# Patient Record
Sex: Female | Born: 1955 | Race: White | Hispanic: No | Marital: Married | Smoking: Former smoker
Health system: Southern US, Community
[De-identification: ages and names within clinical notes are randomized; demographics above are authoritative.]

## PROBLEM LIST (undated history)

## (undated) DIAGNOSIS — E119 Type 2 diabetes mellitus without complications: Secondary | ICD-10-CM

## (undated) DIAGNOSIS — T8859XA Other complications of anesthesia, initial encounter: Secondary | ICD-10-CM

## (undated) DIAGNOSIS — N200 Calculus of kidney: Secondary | ICD-10-CM

## (undated) DIAGNOSIS — F32A Depression, unspecified: Secondary | ICD-10-CM

## (undated) DIAGNOSIS — K589 Irritable bowel syndrome without diarrhea: Secondary | ICD-10-CM

## (undated) DIAGNOSIS — I10 Essential (primary) hypertension: Secondary | ICD-10-CM

## (undated) DIAGNOSIS — T4145XA Adverse effect of unspecified anesthetic, initial encounter: Secondary | ICD-10-CM

## (undated) DIAGNOSIS — K5090 Crohn's disease, unspecified, without complications: Secondary | ICD-10-CM

## (undated) DIAGNOSIS — J45909 Unspecified asthma, uncomplicated: Secondary | ICD-10-CM

## (undated) DIAGNOSIS — E559 Vitamin D deficiency, unspecified: Secondary | ICD-10-CM

## (undated) DIAGNOSIS — S92909A Unspecified fracture of unspecified foot, initial encounter for closed fracture: Secondary | ICD-10-CM

## (undated) DIAGNOSIS — M1990 Unspecified osteoarthritis, unspecified site: Secondary | ICD-10-CM

## (undated) DIAGNOSIS — E7800 Pure hypercholesterolemia, unspecified: Secondary | ICD-10-CM

## (undated) DIAGNOSIS — F329 Major depressive disorder, single episode, unspecified: Secondary | ICD-10-CM

## (undated) DIAGNOSIS — J449 Chronic obstructive pulmonary disease, unspecified: Secondary | ICD-10-CM

## (undated) HISTORY — DX: Type 2 diabetes mellitus without complications: E11.9

## (undated) HISTORY — DX: Irritable bowel syndrome, unspecified: K58.9

## (undated) HISTORY — DX: Chronic obstructive pulmonary disease, unspecified: J44.9

## (undated) HISTORY — DX: Calculus of kidney: N20.0

## (undated) HISTORY — DX: Depression, unspecified: F32.A

## (undated) HISTORY — PX: SHOULDER SURGERY: SHX246

## (undated) HISTORY — DX: Major depressive disorder, single episode, unspecified: F32.9

## (undated) HISTORY — DX: Pure hypercholesterolemia, unspecified: E78.00

## (undated) HISTORY — PX: LITHOTRIPSY: SUR834

## (undated) HISTORY — DX: Unspecified fracture of unspecified foot, initial encounter for closed fracture: S92.909A

## (undated) HISTORY — DX: Crohn's disease, unspecified, without complications: K50.90

## (undated) HISTORY — PX: POLYPECTOMY: SHX149

## (undated) HISTORY — PX: OOPHORECTOMY: SHX86

## (undated) HISTORY — DX: Essential (primary) hypertension: I10

---
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
## 1983-04-10 HISTORY — PX: APPENDECTOMY: SHX54

## 1983-04-10 HISTORY — PX: LAPAROSCOPIC SUPRACERVICAL HYSTERECTOMY: SUR797

## 1983-04-10 HISTORY — PX: ABDOMINAL HYSTERECTOMY: SHX81

## 2003-03-03 ENCOUNTER — Other Ambulatory Visit: Payer: Self-pay

## 2003-03-04 ENCOUNTER — Other Ambulatory Visit: Payer: Self-pay

## 2003-04-10 HISTORY — PX: LAPAROSCOPIC CHOLECYSTECTOMY: SUR755

## 2003-04-22 ENCOUNTER — Other Ambulatory Visit: Payer: Self-pay

## 2003-05-15 ENCOUNTER — Other Ambulatory Visit: Payer: Self-pay

## 2003-06-02 ENCOUNTER — Other Ambulatory Visit: Payer: Self-pay

## 2003-12-07 ENCOUNTER — Other Ambulatory Visit: Payer: Self-pay

## 2003-12-08 ENCOUNTER — Other Ambulatory Visit: Payer: Self-pay

## 2004-04-11 ENCOUNTER — Ambulatory Visit: Payer: Self-pay

## 2005-02-02 ENCOUNTER — Ambulatory Visit: Payer: Self-pay | Admitting: Unknown Physician Specialty

## 2005-03-24 ENCOUNTER — Emergency Department: Payer: Self-pay | Admitting: Emergency Medicine

## 2005-04-27 ENCOUNTER — Ambulatory Visit: Payer: Self-pay | Admitting: Internal Medicine

## 2005-07-17 ENCOUNTER — Ambulatory Visit: Payer: Self-pay

## 2005-12-27 ENCOUNTER — Ambulatory Visit: Payer: Self-pay

## 2006-05-25 ENCOUNTER — Emergency Department: Payer: Self-pay

## 2006-06-05 ENCOUNTER — Inpatient Hospital Stay (HOSPITAL_COMMUNITY): Admission: AD | Admit: 2006-06-05 | Discharge: 2006-06-07 | Payer: Self-pay | Admitting: Orthopedic Surgery

## 2007-05-01 ENCOUNTER — Ambulatory Visit: Payer: Self-pay | Admitting: Internal Medicine

## 2007-06-27 ENCOUNTER — Ambulatory Visit: Payer: Self-pay | Admitting: Unknown Physician Specialty

## 2007-12-27 ENCOUNTER — Ambulatory Visit: Payer: Self-pay | Admitting: Internal Medicine

## 2008-03-27 ENCOUNTER — Emergency Department: Payer: Self-pay | Admitting: Emergency Medicine

## 2008-05-26 ENCOUNTER — Ambulatory Visit: Payer: Self-pay | Admitting: Internal Medicine

## 2009-05-14 ENCOUNTER — Ambulatory Visit: Payer: Self-pay | Admitting: Rheumatology

## 2009-11-03 ENCOUNTER — Ambulatory Visit: Payer: Self-pay | Admitting: Family Medicine

## 2010-06-20 ENCOUNTER — Emergency Department: Payer: Self-pay | Admitting: Emergency Medicine

## 2010-07-31 ENCOUNTER — Ambulatory Visit: Payer: Self-pay | Admitting: Internal Medicine

## 2011-04-10 HISTORY — PX: BREAST EXCISIONAL BIOPSY: SUR124

## 2012-01-18 ENCOUNTER — Telehealth: Payer: Self-pay | Admitting: Internal Medicine

## 2012-01-18 NOTE — Telephone Encounter (Signed)
Patient spouse advised as instructed via telephone.

## 2012-01-18 NOTE — Telephone Encounter (Signed)
If abdominal pain and diarrhea, i prefer her to go ahead and be evaluated now - to make sure nothing more acute is going on (especially if the symptoms are persistant) - then can follow up here afterwards.  May need scan, etc.  Thanks.

## 2012-01-18 NOTE — Telephone Encounter (Signed)
Pt is having really bad abdominal cramps and a lot of diarrhea. She was wanting to know if she could come in any sooner to see you.   Best call back number  Work [phone] or  Home [phone]

## 2012-01-31 ENCOUNTER — Telehealth: Payer: Self-pay | Admitting: Internal Medicine

## 2012-01-31 DIAGNOSIS — R109 Unspecified abdominal pain: Secondary | ICD-10-CM

## 2012-01-31 NOTE — Telephone Encounter (Signed)
Caller: Kambra/Patient; Patient Name: Amanda Wiley; PCP: Einar Pheasant; Best Callback Phone Number: [phone] Onset- 1 month per pt. Pt c/o of  stomach cramps and diarrhea. She has diarrhea at least  5 x per day. She rates stomach cramps at a 4 on pain scale. She did see pink on the toilet paper yesterday with wiping, which she thinks is from irritation from so much diarrhea.   Pt has hx of IBS, Crohn's and Diverticulitis. She is a patient of Dr. Nicki Reaper but has not seen her at this location yet. Her appointment is scheduled for 03/04/12. Pt reports last week she saw the PA at her job [employer] for the symptoms and he started her on Flagyl and she is on day 7  of a 10day prescription. Emergent s/s of Diarrhea or change in bowel habit protocol r/o. Pt to see provider within 24 hrs. Pt wants to know if Dr. Nicki Reaper can see her sooner than 03/04/12 appointment. No appointments seen today or tomorrow. Pt also states she sees Dr. Vira Agar but has to have a referral, so if Dr. Nicki Reaper can not see her can she send a referral so she can see Dr. Vira Agar for the issues. Message sent. Please call.

## 2012-01-31 NOTE — Telephone Encounter (Signed)
If she is doing better on the Flagyl, but having persistent intermittent flares - I do think she needs to see GI (Dr Dillard Essex office).  I can make appt if she is agreeable to do this, but I need to confirm she is doing better.

## 2012-02-01 NOTE — Telephone Encounter (Signed)
Called patient to inform her. Patient stated that she has been in contact with Dr.Elliots office. She said that she needs a referral immediately. She would like for [place] to call her back to let her know.

## 2012-02-04 NOTE — Telephone Encounter (Signed)
Pt called and needs referral to GI.  Need appt as soon as possible.  Thanks.

## 2012-03-04 ENCOUNTER — Ambulatory Visit (INDEPENDENT_AMBULATORY_CARE_PROVIDER_SITE_OTHER): Payer: PRIVATE HEALTH INSURANCE | Admitting: Internal Medicine

## 2012-03-04 ENCOUNTER — Encounter: Payer: Self-pay | Admitting: Internal Medicine

## 2012-03-04 VITALS — BP 163/80 | HR 86 | Temp 97.6°F | Ht 65.0 in | Wt 263.0 lb

## 2012-03-04 DIAGNOSIS — K219 Gastro-esophageal reflux disease without esophagitis: Secondary | ICD-10-CM | POA: Insufficient documentation

## 2012-03-04 DIAGNOSIS — K5090 Crohn's disease, unspecified, without complications: Secondary | ICD-10-CM | POA: Insufficient documentation

## 2012-03-04 DIAGNOSIS — I10 Essential (primary) hypertension: Secondary | ICD-10-CM | POA: Insufficient documentation

## 2012-03-04 DIAGNOSIS — R5381 Other malaise: Secondary | ICD-10-CM

## 2012-03-04 DIAGNOSIS — E119 Type 2 diabetes mellitus without complications: Secondary | ICD-10-CM

## 2012-03-04 DIAGNOSIS — E7800 Pure hypercholesterolemia, unspecified: Secondary | ICD-10-CM

## 2012-03-04 DIAGNOSIS — J45909 Unspecified asthma, uncomplicated: Secondary | ICD-10-CM

## 2012-03-04 DIAGNOSIS — R5383 Other fatigue: Secondary | ICD-10-CM

## 2012-03-04 DIAGNOSIS — Z139 Encounter for screening, unspecified: Secondary | ICD-10-CM

## 2012-03-04 DIAGNOSIS — E559 Vitamin D deficiency, unspecified: Secondary | ICD-10-CM | POA: Insufficient documentation

## 2012-03-04 NOTE — Assessment & Plan Note (Addendum)
Has a documented history of Crohn's disease.  Colonoscopy 06/27/07 revealed a 54m polyp, diverticulosis and internal hemorrhoids.  Pt reports no mention of Crohn's with last evaluation.  Is having the intermittent abdominal pain, nausea, dysphagia and change in appetite as well as loose stool.  Will refer to GI for evaluation.  Had an abdominal CT (per her report) relatively recently.  Obtain results.  Has an appt made for GI next week.  Sx improved today.  Follow.

## 2012-03-04 NOTE — Assessment & Plan Note (Addendum)
EGD 06/27/07 revealed a single gastric polyp with some gastric mucosal erythema.  On nexium.  Nausea and epigastric pain as outlined.  Feels full faster.  Refer to GI for evaluation.

## 2012-03-04 NOTE — Progress Notes (Signed)
  Subjective:    [name], female    DOB: 04-09-1956, 56 y.o.   MRN: [id number]  HPI 56 year old female with past history of documented Crohn's disease, hypertension, hypercholesterolemia, diabetes and IBS who comes in today for a scheduled follow up.  She has had problems with her bowels and increased abdominal pain.  Was having multiple stools per day.  Watery stools.  Increased abdominal pain- more localized in the epigastric region and left side.  Some nausea.  No vomiting.  When she eats - she feels her stomach start to "churn".  Fried foods aggravate.  Some occasional dysphagia and full feeling.  No acid reflux.  Has an appt with GI next week.  Abdominal pain has improved some for now.  Still with loose stool.  States her sugars have been staying in the 170s.  Not watching what she eats.  Recently evaluated for nipple discharge.  Due to follow up with Dr Bary Castilla 12/13.  Has mammo scheduled 03/15/12.    Past Medical History  Diagnosis Date  . Hypertension   . Diabetes mellitus   . Hypercholesterolemia   . Crohn's disease   . IBS (irritable bowel syndrome)   . Depression   . Nephrolithiasis     s/p lithotripsy [facility])  . COPD (chronic obstructive pulmonary disease)     Review of Systems Patient denies any headache, lightheadedness or dizziness.  No significant sinus or allergy symptoms.  No chest pain, tightness or palpitations.  No increased shortness of breath, cough or congestion.  No vomiting.  Intermittent nausea.  No constipation, BRBPR or melana.  Does have the loose stool as outlined.  Abdominal pain as outlined above.  No urine change.        Objective:   Physical Exam Filed Vitals:   03/04/12 0821  BP: 163/80  Pulse: 86  Temp: 97.6 F (36.4 C)   Blood pressure recheck:  76/33  56 year old female in no acute distress.   HEENT:  Nares - clear.  OP- without lesions or erythema.  NECK:  Supple, nontender.  No audible bruit.   HEART:   Appears to be regular. LUNGS:  Without crackles or wheezing audible.  Respirations even and unlabored.   RADIAL PULSE:  Equal bilaterally.  ABDOMEN:  Soft.  Minimal tenderness to palpation over the epigastric region.  No rebound or guardng present.  No audible abdominal bruit.   EXTREMITIES:  No increased edema to be present.                     Assessment & Plan:  INCREASED PSYCHOSOCIAL STRESSORS.  On Wellbutrin.  Stable.    CARDIOVASCULAR.  Asymptomatic.  Continue risk factor modification.    PREVIOUS NIPPLE DISCHARGE.  Evaluated by Dr Bary Castilla.  Due follow up in 12/13.  Mammogram scheduled.   GI.  Abdominal pain and bowel change as outlined.  Has been a persistent intermittent problem for her.  See above.  Continue Nexium.  States she has had a relatively recent CT abdomen.  Obtain results.  Carries the diagnosis of Crohn's disease.  States on her last colonoscopy - no mention of Crohn's.  Has an appt next week with GI.  Keep appt.     HEALTH MAINTENANCE.  Physical 03/26/11.  GI as outlined.  Mammogram 05/07/11 - BiRADS II.

## 2012-03-04 NOTE — Patient Instructions (Addendum)
It was nice seeing you today.  I am sorry you have not been feeling well.  Keep the appt with GI and follow your sugars.  Send me readings as we discussed.  Let me know if problems.

## 2012-03-04 NOTE — Assessment & Plan Note (Addendum)
Low cholesterol diet and exercise.  Declines medication.  Check lipid panel with next fasting labs.

## 2012-03-05 ENCOUNTER — Telehealth: Payer: Self-pay | Admitting: *Deleted

## 2012-03-05 NOTE — Telephone Encounter (Signed)
Jefm Bryant called about patient's notes concerning abdominal pain. Mickel Baas (fax# [phone]) would records to her today. Please advice

## 2012-03-06 ENCOUNTER — Emergency Department: Payer: Self-pay | Admitting: Emergency Medicine

## 2012-03-06 ENCOUNTER — Encounter: Payer: Self-pay | Admitting: Internal Medicine

## 2012-03-06 LAB — RAPID INFLUENZA A&B ANTIGENS

## 2012-03-06 NOTE — Telephone Encounter (Signed)
Note is complete.  You can send to GI.

## 2012-03-06 NOTE — Assessment & Plan Note (Signed)
Sugars as outlined.  Same meds.  Diabetic diet and exercise.  Check met b and a1c.  Keep up to date with eye checks.

## 2012-03-06 NOTE — Assessment & Plan Note (Signed)
Breathing stable.  Has not required prednisone lately.  Follow.

## 2012-03-06 NOTE — Assessment & Plan Note (Signed)
Blood pressure on recheck improved.  Same meds.  Check metabolic panel.

## 2012-03-06 NOTE — Assessment & Plan Note (Signed)
Continue supplements.  Follow vitamin D level.

## 2012-03-07 ENCOUNTER — Telehealth: Payer: Self-pay | Admitting: Internal Medicine

## 2012-03-07 NOTE — Telephone Encounter (Signed)
Called patient to let her know. Patient stated that she will continue using these rx's and that she will call if symptoms persist.

## 2012-03-07 NOTE — Telephone Encounter (Signed)
If she is coughing - i worry about spasm and inflammation in her lungs - given her history.   Using a cough suppressant can worsen this.  I recommend her to use her inhalers regularly (steroid inhaler and her rescue inhaler).  Also continue the prednisone.  Can use mucinex DM in the am and Robitussin DM in the evening - as long as she has no problems taking mucinex or robitussin.  If persistent problems, needs evaluation.

## 2012-03-07 NOTE — Telephone Encounter (Signed)
Pt recently seen you this week and then had to go to the urgent care. She has the Flu and they gave her Prednizone and Tamaflu. She is still coughing badly and she was wondering if you could call her in some Tusinex. Cough syrup with codine. [facility] [place].

## 2012-03-07 NOTE — Telephone Encounter (Signed)
Notes faxed.

## 2012-03-11 ENCOUNTER — Encounter: Payer: Self-pay | Admitting: General Practice

## 2012-03-11 ENCOUNTER — Ambulatory Visit (INDEPENDENT_AMBULATORY_CARE_PROVIDER_SITE_OTHER): Payer: PRIVATE HEALTH INSURANCE | Admitting: Internal Medicine

## 2012-03-11 ENCOUNTER — Encounter: Payer: Self-pay | Admitting: Internal Medicine

## 2012-03-11 ENCOUNTER — Telehealth: Payer: Self-pay | Admitting: Internal Medicine

## 2012-03-11 VITALS — BP 132/80 | HR 82 | Temp 97.9°F | Ht 65.0 in | Wt 257.0 lb

## 2012-03-11 DIAGNOSIS — E119 Type 2 diabetes mellitus without complications: Secondary | ICD-10-CM

## 2012-03-11 DIAGNOSIS — I10 Essential (primary) hypertension: Secondary | ICD-10-CM

## 2012-03-11 DIAGNOSIS — J45909 Unspecified asthma, uncomplicated: Secondary | ICD-10-CM

## 2012-03-11 MED ORDER — ALBUTEROL SULFATE (2.5 MG/3ML) 0.083% IN NEBU: 2.5000 mg | INHALATION_SOLUTION | Freq: Four times a day (QID) | RESPIRATORY_TRACT | Status: DC | PRN

## 2012-03-11 MED ORDER — PREDNISONE 10 MG PO TABS: ORAL_TABLET | ORAL | Status: DC

## 2012-03-11 MED ORDER — LEVOFLOXACIN 500 MG PO TABS: 500.0000 mg | ORAL_TABLET | Freq: Every day | ORAL | Status: DC

## 2012-03-11 NOTE — Patient Instructions (Signed)
It was nice seeing you today.   I am sorry you are not feeling well.  I am going to start levaquin and a prednisone taper.  Keep me posted - how you are doing.

## 2012-03-11 NOTE — Telephone Encounter (Signed)
See if she can come in at 2:30 today.  thanks

## 2012-03-11 NOTE — Telephone Encounter (Signed)
Pt was diagnosed at the hospital with the Flu and she has finished her Tamaflu and still has some stuff in her lungs. She was wondering if she could be seen or anything called in.

## 2012-03-12 ENCOUNTER — Encounter: Payer: Self-pay | Admitting: Internal Medicine

## 2012-03-12 NOTE — Assessment & Plan Note (Signed)
Sugar today 270.  She has active infection and is on prednisone.  Stay hydrated.  Monitor sugars closely.  She has humalog pens if needed for elevated blood sugar.

## 2012-03-12 NOTE — Progress Notes (Signed)
Subjective:    [name], female    DOB: 02-16-1956, 56 y.o.   MRN: [id number]  HPI  56 year old female with past history of documented Crohn's disease, hypertension, hypercholesterolemia, diabetes and IBS who comes in today as a work in with concerns regarding persistent cough and congestion.  She went to the ER several days ago and was diagnosed with the flu.  Was given prednisone and Tamiflu.  She states she is not having the body aches now.  Still with intermittent low grade fever with Tmax 100.4.  No significant sinus pressure, but does have some increased nasal congestion and post nasal drainage.  Sore throat.  "Glands sore".  Increased chest congestion productive of thick green mucus.  Increased cough.  Wheezing.  No significant vomiting.  Had some loose stool yesterday, but this has resolved.      Past Medical History  Diagnosis Date  . Hypertension   . Diabetes mellitus   . Hypercholesterolemia   . Crohn's disease   . IBS (irritable bowel syndrome)   . Depression   . Nephrolithiasis     s/p lithotripsy [facility])  . COPD (chronic obstructive pulmonary disease)     Review of Systems Patient denies any lightheadedness or dizziness.  No chest pain or palpitations.  She does report the increased congestion, cough and wheezing as outlined.  Has taken mucinex.  Finished the prednisone and Tamiflu.  No constipation, BRBPR or melana.  Does have the loose stool as outlined.  Abdominal pain as outlined above.  No urine change.  Increased fatigue.  She eating.  Decreased appetite.  Not checking her sugars.  Some loose stool yesterday.  Denies today.       Objective:   Physical Exam  Filed Vitals:   03/11/12 1442  BP: 132/80  Pulse: 82  Temp: 97.9 F (36.6 C)   Blood pressure recheck:  72/35  56 year old female in no acute distress.   HEENT:  Nares - clear except slightly erythematous turbinates.  OP- without lesions or erythema.  NECK:  Supple.   Minimal tenderness to palpation - anterior cervical region.  No neck rigidity.    HEART:  Appears to be regular. LUNGS:  Without crackles.  Increased congestion that cleared some with coughing.  Increased wheezing - noted more with forced expiration.   Respirations even and unlabored.  Post neg - increased air movement.   RADIAL PULSE:  Equal bilaterally.      Assessment & Plan:  PROBABLE BRONCHITIS/URI.  Post neb exam revealed increased air movement and decreased congestion.  Recently diagnosed with the flu.  Now with thick productive green mucus, persistent fever, etc. Concern regarding a overlying bacterial infection.  Treat with Levaquin 540m q day as directed.  Prednisone taper starting at 646mand decreasing by 64m41mntil off.  Continue Advair bid and the rescue inhaler and nebs as directed.  Can continue mucinex and saline nasal flushes as directed.  Rest.  Fluids.  Out of work the rest of the week.  Explained to her and her husband if symptoms changed of worsened, she was to be reevaluated immediately.  They were comfortable with this plan.     CARDIOVASCULAR.  Asymptomatic.  Continue risk factor modification.    GI.  Abdominal pain and bowel change as outlined previously.  Had to change her appt with GI - because she was sick.  Minimal loose stool yesterday.  None today.  Follow.     HEALTH  MAINTENANCE.  Physical 03/26/11.  GI as outlined.  Mammogram 05/07/11 - BiRADS II.

## 2012-03-12 NOTE — Assessment & Plan Note (Signed)
Treat the active infection as outlined.  Follow closely.  If any acute change or worsening - she is to be reevaluated.  This was explained to her and her husband in detail.  They were comfortable with this plan.

## 2012-03-12 NOTE — Assessment & Plan Note (Signed)
Blood pressure under good control.  Same meds.  Follow.

## 2012-03-18 ENCOUNTER — Telehealth: Payer: Self-pay | Admitting: Internal Medicine

## 2012-03-18 NOTE — Telephone Encounter (Signed)
Needed 3 prescriptions written to take to her pharmacy at [facility] . Wellbutrin 150 mg , Glucophage 500 mg and Singular 42m  . Please call patient to pick up.

## 2012-03-20 ENCOUNTER — Other Ambulatory Visit: Payer: Self-pay | Admitting: *Deleted

## 2012-03-20 MED ORDER — METFORMIN HCL 500 MG PO TABS: ORAL_TABLET | ORAL | Status: DC

## 2012-03-20 MED ORDER — MONTELUKAST SODIUM 10 MG PO TABS: 10.0000 mg | ORAL_TABLET | Freq: Every day | ORAL | Status: DC

## 2012-03-20 MED ORDER — BUPROPION HCL ER (XL) 150 MG PO TB24: 150.0000 mg | ORAL_TABLET | Freq: Every day | ORAL | Status: DC

## 2012-03-20 NOTE — Telephone Encounter (Signed)
Prescriptions faxed to pharmacy.

## 2012-03-20 NOTE — Telephone Encounter (Signed)
Prescription faxed to pharmacy.

## 2012-03-20 NOTE — Telephone Encounter (Signed)
Dr. Nicki Reaper:  The patient needs the following scripts written for to take to [facility].  Wellbutrin 150 mg, Glucophage 500 mg and Singular 10 mg.

## 2012-03-20 NOTE — Telephone Encounter (Signed)
rx printed and placed on your desk.  Pt can come by and pick up

## 2012-03-20 NOTE — Telephone Encounter (Signed)
Error

## 2012-03-21 ENCOUNTER — Other Ambulatory Visit: Payer: Self-pay | Admitting: *Deleted

## 2012-03-21 MED ORDER — MONTELUKAST SODIUM 10 MG PO TABS: 10.0000 mg | ORAL_TABLET | Freq: Every day | ORAL | Status: DC

## 2012-03-21 MED ORDER — METFORMIN HCL 500 MG PO TABS: ORAL_TABLET | ORAL | Status: DC

## 2012-03-21 MED ORDER — BUPROPION HCL ER (XL) 150 MG PO TB24: 150.0000 mg | ORAL_TABLET | Freq: Every day | ORAL | Status: DC

## 2012-03-25 ENCOUNTER — Other Ambulatory Visit (INDEPENDENT_AMBULATORY_CARE_PROVIDER_SITE_OTHER): Payer: PRIVATE HEALTH INSURANCE

## 2012-03-25 DIAGNOSIS — E119 Type 2 diabetes mellitus without complications: Secondary | ICD-10-CM

## 2012-03-25 DIAGNOSIS — J45909 Unspecified asthma, uncomplicated: Secondary | ICD-10-CM

## 2012-03-25 DIAGNOSIS — E7800 Pure hypercholesterolemia, unspecified: Secondary | ICD-10-CM

## 2012-03-25 DIAGNOSIS — R5383 Other fatigue: Secondary | ICD-10-CM

## 2012-03-25 DIAGNOSIS — R5381 Other malaise: Secondary | ICD-10-CM

## 2012-03-25 DIAGNOSIS — E559 Vitamin D deficiency, unspecified: Secondary | ICD-10-CM

## 2012-03-25 DIAGNOSIS — I10 Essential (primary) hypertension: Secondary | ICD-10-CM

## 2012-03-25 LAB — CBC WITH DIFFERENTIAL/PLATELET
Basophils Absolute: 0.1 10*3/uL (ref 0.0–0.1)
Basophils Relative: 0.6 % (ref 0.0–3.0)
Eosinophils Relative: 1.4 % (ref 0.0–5.0)
Lymphocytes Relative: 21.1 % (ref 12.0–46.0)
Lymphs Abs: 1.9 10*3/uL (ref 0.7–4.0)
MCHC: 33.1 g/dL (ref 30.0–36.0)
Monocytes Absolute: 1 10*3/uL (ref 0.1–1.0)
Monocytes Relative: 10.6 % (ref 3.0–12.0)
Neutro Abs: 6.1 10*3/uL (ref 1.4–7.7)
Neutrophils Relative %: 66.3 % (ref 43.0–77.0)
WBC: 9.2 10*3/uL (ref 4.5–10.5)

## 2012-03-25 LAB — HEPATIC FUNCTION PANEL
Alkaline Phosphatase: 47 U/L (ref 39–117)
Bilirubin, Direct: 0.1 mg/dL (ref 0.0–0.3)
Total Bilirubin: 0.8 mg/dL (ref 0.3–1.2)

## 2012-03-25 LAB — BASIC METABOLIC PANEL
BUN: 12 mg/dL (ref 6–23)
CO2: 25 mEq/L (ref 19–32)
GFR: 90.13 mL/min (ref >60.00)

## 2012-03-25 LAB — LIPID PANEL
Cholesterol: 226 mg/dL — ABNORMAL HIGH (ref 0–200)
HDL: 53.1 mg/dL (ref >39.00)

## 2012-03-26 LAB — VITAMIN D 25 HYDROXY (VIT D DEFICIENCY, FRACTURES): Vit D, 25-Hydroxy: 22 ng/mL — ABNORMAL LOW (ref 30–89)

## 2012-04-09 HISTORY — PX: COLONOSCOPY: SHX174

## 2012-04-10 ENCOUNTER — Telehealth: Payer: Self-pay | Admitting: Internal Medicine

## 2012-04-10 NOTE — Telephone Encounter (Signed)
Patient called back and has been scheduled 1.9.14 @ 11.

## 2012-04-10 NOTE — Telephone Encounter (Signed)
Left msg asking patient to return my call so I can schedule an appointment for patient for 04/16/11 at 1:15 to see Dr. Nicki Reaper.

## 2012-04-15 ENCOUNTER — Ambulatory Visit: Payer: PRIVATE HEALTH INSURANCE | Admitting: Internal Medicine

## 2012-04-17 ENCOUNTER — Ambulatory Visit (INDEPENDENT_AMBULATORY_CARE_PROVIDER_SITE_OTHER): Payer: PRIVATE HEALTH INSURANCE | Admitting: Internal Medicine

## 2012-04-17 VITALS — BP 128/78 | HR 99 | Temp 97.7°F | Ht 65.0 in | Wt 260.0 lb

## 2012-04-17 DIAGNOSIS — E119 Type 2 diabetes mellitus without complications: Secondary | ICD-10-CM

## 2012-04-17 MED ORDER — INSULIN GLARGINE 100 UNIT/ML ~~LOC~~ SOLN: SUBCUTANEOUS | Status: DC

## 2012-04-20 ENCOUNTER — Encounter: Payer: Self-pay | Admitting: Internal Medicine

## 2012-04-20 NOTE — Assessment & Plan Note (Signed)
A1c/sugars elevated despite multiple oral meds.  Discussed diet, exercise and weight loss.  Will start Lantus 10 units q am.  Follow sugars.  Send in readings over the next couple of weeks.  Get her back in soon to reassess.  Plans to get more serious about her diet.  Follow.

## 2012-04-20 NOTE — Progress Notes (Signed)
  Subjective:    [name], female    DOB: [birth year], 57 y.o.   MRN: [id number]  HPI 57 year old female with past history of documented Crohn's disease, hypertension, hypercholesterolemia, diabetes and IBS who comes in today for a scheduled follow up.  She has been having increased sugars recently despite multiple medications.  Have discussed diet and exercise and weight loss.  Her and her husband plan on joining weight watchers. She does plan to start exercising.  Sugars before breakfast - 130-170.  Pm sugars 170-240.  Does not always eat at set times.  Plans to do better about this as well.    Past Medical History  Diagnosis Date  . Hypertension   . Diabetes mellitus   . Hypercholesterolemia   . Crohn's disease   . IBS (irritable bowel syndrome)   . Depression   . Nephrolithiasis     s/p lithotripsy [facility])  . COPD (chronic obstructive pulmonary disease)     Review of Systems Patient denies any headache, lightheadedness or dizziness.  No significant sinus or allergy symptoms.  No chest pain, tightness or palpitations.  No increased shortness of breath, cough or congestion.  No vomiting.  No constipation, BRBPR or melana.  Planning to follow up with GI regarding her bowels and stomach issues.  States she fell a few days ago.  Slipped.  Hit her right shoulder.  Able to move.  Some minimal pain.  Does not feel she needs any further intervention.  Will follow.      Objective:   Physical Exam  Filed Vitals:   04/17/12 1119  BP: 128/78  Pulse: 99  Temp: 97.7 F (83.27 C)   57 year old female in no acute distress.   HEENT:  Nares - clear.  OP- without lesions or erythema.  NECK:  Supple, nontender.  No audible bruit.   HEART:  Appears to be regular. LUNGS:  Without crackles or wheezing audible.  Respirations even and unlabored.   RADIAL PULSE:  Equal bilaterally.        Assessment & Plan:  INCREASED PSYCHOSOCIAL STRESSORS.  On Wellbutrin.  Stable.      CARDIOVASCULAR.  Asymptomatic.  Continue risk factor modification.    HEALTH MAINTENANCE.  Physical 03/26/11.  GI as outlined.  Mammogram 05/07/11 - BiRADS II.

## 2012-04-22 ENCOUNTER — Telehealth: Payer: Self-pay | Admitting: Internal Medicine

## 2012-04-22 MED ORDER — INSULIN PEN NEEDLE 31G X 8 MM MISC: Status: DC

## 2012-04-22 NOTE — Telephone Encounter (Signed)
Patient is needing needles for [facility]uline[facility] [place] [facility] She is needing them today.

## 2012-04-22 NOTE — Telephone Encounter (Signed)
Sent in to pharmacy.  

## 2012-04-30 ENCOUNTER — Encounter: Payer: Self-pay | Admitting: Internal Medicine

## 2012-05-09 ENCOUNTER — Ambulatory Visit: Payer: Self-pay | Admitting: Unknown Physician Specialty

## 2012-05-13 LAB — PATHOLOGY REPORT

## 2012-06-04 ENCOUNTER — Encounter: Payer: Self-pay | Admitting: Internal Medicine

## 2012-06-04 ENCOUNTER — Ambulatory Visit (INDEPENDENT_AMBULATORY_CARE_PROVIDER_SITE_OTHER): Payer: PRIVATE HEALTH INSURANCE | Admitting: Internal Medicine

## 2012-06-04 VITALS — BP 130/70 | HR 74 | Temp 98.1°F | Ht 65.0 in | Wt 258.5 lb

## 2012-06-04 DIAGNOSIS — R109 Unspecified abdominal pain: Secondary | ICD-10-CM

## 2012-06-04 DIAGNOSIS — E119 Type 2 diabetes mellitus without complications: Secondary | ICD-10-CM

## 2012-06-04 DIAGNOSIS — I10 Essential (primary) hypertension: Secondary | ICD-10-CM

## 2012-06-04 DIAGNOSIS — J45909 Unspecified asthma, uncomplicated: Secondary | ICD-10-CM

## 2012-06-04 DIAGNOSIS — K219 Gastro-esophageal reflux disease without esophagitis: Secondary | ICD-10-CM

## 2012-06-04 DIAGNOSIS — E559 Vitamin D deficiency, unspecified: Secondary | ICD-10-CM

## 2012-06-04 DIAGNOSIS — K5090 Crohn's disease, unspecified, without complications: Secondary | ICD-10-CM

## 2012-06-04 DIAGNOSIS — E7800 Pure hypercholesterolemia, unspecified: Secondary | ICD-10-CM

## 2012-06-08 ENCOUNTER — Encounter: Payer: Self-pay | Admitting: Internal Medicine

## 2012-06-08 NOTE — Assessment & Plan Note (Signed)
Blood pressure doing well.  Same meds.  Follow metabolic panel.

## 2012-06-08 NOTE — Assessment & Plan Note (Signed)
Breathing stable.

## 2012-06-08 NOTE — Assessment & Plan Note (Signed)
Just had colonoscopy.  Revealed a 23m polyp, diverticulosis and internal hemorrhoids.

## 2012-06-08 NOTE — Assessment & Plan Note (Signed)
Low cholesterol diet and exercise.  Declines medication.  Check lipid panel with next fasting labs.

## 2012-06-08 NOTE — Assessment & Plan Note (Signed)
EGD 06/27/07 revealed a single gastric polyp with some gastric mucosal erythema.  Just had follow up EGD.  Placed on carafate.  Upper symptoms appear to be controlled.  Follow.

## 2012-06-08 NOTE — Assessment & Plan Note (Signed)
Continue supplements.  Follow vitamin D level.

## 2012-06-08 NOTE — Progress Notes (Signed)
Subjective:    [name], female    DOB: 1956/02/26, 57 y.o.   MRN: [id number]  HPI 57 year old female with past history of documented Crohn's disease, hypertension, hypercholesterolemia, diabetes and IBS who comes in today to follow up on these issues as well as for a complete physical exam.   She had been having increased sugars recently despite multiple medications.  Have discussed diet and exercise and weight loss.  Started Lantus last visit.  Sugars before breakfast - 130-150.  Pm sugars 150-170.  Does not always eat at set times.  Last a1c 8.9.  Sugars appear to be improving.  Breathing stable.  No nausea or vomiting.  Does report that she is still having intermittent abdominal discomfort.  Still with intermittent diarrhea.  Usually occurs before lunch and before supper.  Just had a colonoscopy.  Revealed diverticulosis and internal hemorrhoids.     Past Medical History  Diagnosis Date  . Hypertension   . Diabetes mellitus   . Hypercholesterolemia   . Crohn's disease   . IBS (irritable bowel syndrome)   . Depression   . Nephrolithiasis     s/p lithotripsy [facility])  . COPD (chronic obstructive pulmonary disease)     Current Outpatient Prescriptions on File Prior to Visit  Medication Sig Dispense Refill  . albuterol (PROVENTIL HFA;VENTOLIN HFA) 108 (90 BASE) MCG/ACT inhaler Inhale 2 puffs into the lungs every 6 (six) hours as needed.      Marland Kitchen albuterol (PROVENTIL) (2.5 MG/3ML) 0.083% nebulizer solution Take 3 mLs (2.5 mg total) by nebulization every 6 (six) hours as needed for wheezing.  150 mL  1  . amLODipine (NORVASC) 5 MG tablet Take 5 mg by mouth daily.      Marland Kitchen aspirin 81 MG tablet Take 81 mg by mouth daily.      Marland Kitchen buPROPion (WELLBUTRIN XL) 150 MG 24 hr tablet Take 1 tablet (150 mg total) by mouth daily.  90 tablet  1  . esomeprazole (NEXIUM) 40 MG capsule Take 40 mg by mouth 2 (two) times daily.      Marland Kitchen estrogens, conjugated, (PREMARIN) 0.3 MG tablet  Take 0.3 mg by mouth daily. Take daily for 21 days then do not take for 7 days.      . fluticasone-salmeterol (ADVAIR HFA) 230-21 MCG/ACT inhaler Inhale 2 puffs into the lungs 2 (two) times daily.      . furosemide (LASIX) 20 MG tablet Take 20 mg by mouth daily.      Marland Kitchen glimepiride (AMARYL) 2 MG tablet 2 mg. 2 tablets q am and one tablet q pm      . hyoscyamine (LEVSIN SL) 0.125 MG SL tablet Place 0.125 mg under the tongue every 6 (six) hours as needed.      . insulin glargine (LANTUS SOLOSTAR) 100 UNIT/ML injection Give 10 units q am  5 pen  3  . Insulin Pen Needle (B-D ULTRAFINE III SHORT PEN) 31G X 8 MM MISC Use as directed  100 each  1  . levalbuterol (XOPENEX) 1.25 MG/3ML nebulizer solution Take 1.25 mg by nebulization every 6 (six) hours as needed.      Marland Kitchen levofloxacin (LEVAQUIN) 500 MG tablet Take 1 tablet (500 mg total) by mouth daily.  10 tablet  0  . losartan (COZAAR) 100 MG tablet Take 100 mg by mouth daily.      . metFORMIN (GLUCOPHAGE) 500 MG tablet 2 tablets bid  360 tablet  3  . metoprolol succinate (  TOPROL-XL) 50 MG 24 hr tablet Take 50 mg by mouth daily. Take with or immediately following a meal.      . montelukast (SINGULAIR) 10 MG tablet Take 1 tablet (10 mg total) by mouth daily.  90 tablet  3  . pioglitazone (ACTOS) 30 MG tablet Take 30 mg by mouth daily.      . predniSONE (DELTASONE) 10 MG tablet Take 6 tablet x 1 day and then decrease by 1/2 tablet per day until down to zero mg.  39 tablet  0   No current facility-administered medications on file prior to visit.    Review of Systems Patient denies any headache, lightheadedness or dizziness.  No significant sinus or allergy symptoms.  No chest pain, tightness or palpitations.  No increased shortness of breath, cough or congestion.  Breathing stable.  No vomiting.  No constipation, BRBPR or melana.  Still having multiple episodes of diarrhea.  Still with intermittent abdominal pain and discomfort.  On Carafate.       Objective:   Physical Exam  Filed Vitals:   06/04/12 0854  BP: 130/70  Pulse: 74  Temp: 98.1 F (41.74 C)   57 year old female in no acute distress.   HEENT:  Nares- clear.  Oropharynx - without lesions. NECK:  Supple.  Nontender.  No audible bruit.  HEART:  Appears to be regular. LUNGS:  No crackles or wheezing audible.  Respirations even and unlabored.  RADIAL PULSE:  Equal bilaterally.    BREASTS:  No nipple discharge or nipple retraction present.  Could not appreciate any distinct nodules or axillary adenopathy.  ABDOMEN:  Soft, nontender.  Bowel sounds present and normal.  No audible abdominal bruit.  GU:  Pt declined.   RECTAL:  Pt declined.   EXTREMITIES:  No increased edema present.  DP pulses palpable and equal bilaterally.             Assessment & Plan:  INCREASED PSYCHOSOCIAL STRESSORS.  On Wellbutrin.  Stable.    CARDIOVASCULAR.  Asymptomatic.  Continue risk factor modification.   ABDOMINAL PAIN.  Persistent.  Still with diarrhea - after lunch and her evening meal.  Was referred to GI. Had a colonoscopy and EGD.  Was placed on carafate.  Will refer back to GI for evaluation of the ongoing bowel change and abdominal discomfort.   HEALTH MAINTENANCE.  Physical today.  GI as outlined.  Mammogram 05/07/11 - BiRADS II.  Has been seeing Dr Bary Castilla.  Just had a follow up mammogram.  States everything checked out fine.  Obtain results.

## 2012-06-08 NOTE — Assessment & Plan Note (Signed)
Sugars improving.  Will increase Lantus to 12 units.  Follow sugars.  Continue to adjust meds as needed.

## 2012-06-09 ENCOUNTER — Telehealth: Payer: Self-pay | Admitting: Internal Medicine

## 2012-06-09 MED ORDER — FUROSEMIDE 20 MG PO TABS: 20.0000 mg | ORAL_TABLET | Freq: Every day | ORAL | Status: DC

## 2012-06-09 MED ORDER — PIOGLITAZONE HCL 30 MG PO TABS: 30.0000 mg | ORAL_TABLET | Freq: Every day | ORAL | Status: DC

## 2012-06-11 NOTE — Telephone Encounter (Signed)
rx given to pts mother for her to take to [facility]

## 2012-06-29 ENCOUNTER — Telehealth: Payer: Self-pay | Admitting: Internal Medicine

## 2012-06-29 MED ORDER — GLIMEPIRIDE 2 MG PO TABS: ORAL_TABLET | ORAL | Status: DC

## 2012-06-29 NOTE — Telephone Encounter (Signed)
Refilled amaryl x 6

## 2012-08-14 ENCOUNTER — Encounter: Payer: Self-pay | Admitting: Internal Medicine

## 2012-08-14 ENCOUNTER — Ambulatory Visit (INDEPENDENT_AMBULATORY_CARE_PROVIDER_SITE_OTHER): Payer: PRIVATE HEALTH INSURANCE | Admitting: Internal Medicine

## 2012-08-14 VITALS — BP 136/70 | HR 84 | Temp 98.3°F | Ht 65.0 in | Wt 261.0 lb

## 2012-08-14 DIAGNOSIS — E559 Vitamin D deficiency, unspecified: Secondary | ICD-10-CM

## 2012-08-14 DIAGNOSIS — E7800 Pure hypercholesterolemia, unspecified: Secondary | ICD-10-CM

## 2012-08-14 DIAGNOSIS — J45909 Unspecified asthma, uncomplicated: Secondary | ICD-10-CM

## 2012-08-14 DIAGNOSIS — K219 Gastro-esophageal reflux disease without esophagitis: Secondary | ICD-10-CM

## 2012-08-14 DIAGNOSIS — I10 Essential (primary) hypertension: Secondary | ICD-10-CM

## 2012-08-14 DIAGNOSIS — E119 Type 2 diabetes mellitus without complications: Secondary | ICD-10-CM

## 2012-08-14 DIAGNOSIS — K5090 Crohn's disease, unspecified, without complications: Secondary | ICD-10-CM

## 2012-08-17 ENCOUNTER — Encounter: Payer: Self-pay | Admitting: Internal Medicine

## 2012-08-17 NOTE — Assessment & Plan Note (Signed)
EGD 06/27/07 revealed a single gastric polyp with some gastric mucosal erythema.  Just had follow up EGD.  Placed on carafate.  Upper symptoms appear to be controlled.  Follow.

## 2012-08-17 NOTE — Assessment & Plan Note (Signed)
Blood pressure doing well.  Same meds.  Follow metabolic panel.

## 2012-08-17 NOTE — Assessment & Plan Note (Signed)
Just had colonoscopy.  Revealed a 45m polyp, diverticulosis and internal hemorrhoids.

## 2012-08-17 NOTE — Assessment & Plan Note (Addendum)
Sugars improving.  Will increase Lantus to 14 units.  Follow sugars.  Continue to adjust meds as needed.  Discussed the importance of diet, exercise and weight loss.

## 2012-08-17 NOTE — Assessment & Plan Note (Signed)
Low cholesterol diet and exercise.  Declines medication.  Check lipid panel with next fasting labs.

## 2012-08-17 NOTE — Assessment & Plan Note (Signed)
Breathing stable.

## 2012-08-17 NOTE — Progress Notes (Signed)
Subjective:    [name], female    DOB: 05/24/1955, 56 y.o.   MRN: [id number]  HPI 57 year old female with past history of documented Crohn's disease, hypertension, hypercholesterolemia, diabetes and IBS who comes in today for a scheduled follow up.   She had been having increased sugars recently despite multiple medications.  Have discussed diet and exercise and weight loss.  On Lantus now.  Sugars before breakfast - 120-140.  Pm sugars 120-160 previously now with 200s.   Does not always eat at set times.  Last a1c 8.9.  Sugars appear to be improving overall.  Breathing stable.  No nausea or vomiting.   Just had a colonoscopy.  Revealed diverticulosis and internal hemorrhoids.  The abdominal pain and discomfort have improved.  Overall she feels she is doing well.    Past Medical History  Diagnosis Date  . Hypertension   . Diabetes mellitus   . Hypercholesterolemia   . Crohn's disease   . IBS (irritable bowel syndrome)   . Depression   . Nephrolithiasis     s/p lithotripsy [facility])  . COPD (chronic obstructive pulmonary disease)     Current Outpatient Prescriptions on File Prior to Visit  Medication Sig Dispense Refill  . albuterol (PROVENTIL HFA;VENTOLIN HFA) 108 (90 BASE) MCG/ACT inhaler Inhale 2 puffs into the lungs every 6 (six) hours as needed.      Marland Kitchen albuterol (PROVENTIL) (2.5 MG/3ML) 0.083% nebulizer solution Take 3 mLs (2.5 mg total) by nebulization every 6 (six) hours as needed for wheezing.  150 mL  1  . amLODipine (NORVASC) 5 MG tablet Take 5 mg by mouth daily.      Marland Kitchen aspirin 81 MG tablet Take 81 mg by mouth daily.      Marland Kitchen buPROPion (WELLBUTRIN XL) 150 MG 24 hr tablet Take 1 tablet (150 mg total) by mouth daily.  90 tablet  1  . esomeprazole (NEXIUM) 40 MG capsule Take 40 mg by mouth 2 (two) times daily.      Marland Kitchen estrogens, conjugated, (PREMARIN) 0.3 MG tablet Take 0.3 mg by mouth daily. Take daily for 21 days then do not take for 7 days.      .  fluticasone-salmeterol (ADVAIR HFA) 230-21 MCG/ACT inhaler Inhale 2 puffs into the lungs 2 (two) times daily.      . furosemide (LASIX) 20 MG tablet Take 1 tablet (20 mg total) by mouth daily.  90 tablet  3  . glimepiride (AMARYL) 2 MG tablet 2 tablets q am and one tablet q pm  270 tablet  5  . hyoscyamine (LEVSIN SL) 0.125 MG SL tablet Place 0.125 mg under the tongue every 6 (six) hours as needed.      . Insulin Pen Needle (B-D ULTRAFINE III SHORT PEN) 31G X 8 MM MISC Use as directed  100 each  1  . levalbuterol (XOPENEX) 1.25 MG/3ML nebulizer solution Take 1.25 mg by nebulization every 6 (six) hours as needed.      Marland Kitchen losartan (COZAAR) 100 MG tablet Take 100 mg by mouth daily.      . metFORMIN (GLUCOPHAGE) 500 MG tablet 2 tablets bid  360 tablet  3  . metoprolol succinate (TOPROL-XL) 50 MG 24 hr tablet Take 50 mg by mouth daily. Take with or immediately following a meal.      . montelukast (SINGULAIR) 10 MG tablet Take 1 tablet (10 mg total) by mouth daily.  90 tablet  3  . pioglitazone (ACTOS) 30  MG tablet Take 1 tablet (30 mg total) by mouth daily.  90 tablet  3  . sucralfate (CARAFATE) 1 G tablet Take 1 g by mouth 2 (two) times daily.       No current facility-administered medications on file prior to visit.    Review of Systems Patient denies any headache, lightheadedness or dizziness.  No significant sinus or allergy symptoms.  No chest pain, tightness or palpitations.  No increased shortness of breath, cough or congestion.  Breathing stable.  No vomiting.  No constipation, BRBPR or melana.  On Carafate.  Abdominal pain improved.  Bowels better.      Objective:   Physical Exam  Filed Vitals:   08/14/12 1625  BP: 136/70  Pulse: 84  Temp: 98.3 F (62.25 C)   57 year old female in no acute distress.   HEENT:  Nares- clear.  Oropharynx - without lesions. NECK:  Supple.  Nontender.  No audible bruit.  HEART:  Appears to be regular. LUNGS:  No crackles or wheezing audible.   Respirations even and unlabored.  RADIAL PULSE:  Equal bilaterally.  ABDOMEN:  Soft, nontender.  Bowel sounds present and normal.  No audible abdominal bruit.    EXTREMITIES:  No increased edema present.  DP pulses palpable and equal bilaterally.             Assessment & Plan:  INCREASED PSYCHOSOCIAL STRESSORS.  On Wellbutrin.  Stable.    CARDIOVASCULAR.  Asymptomatic.  Continue risk factor modification.   ABDOMINAL PAIN.  Had a colonoscopy and EGD.  Was placed on carafate.  No significant abdominal pain now.  Bowels stable.  Follow.    HEALTH MAINTENANCE.  Physical 06/04/12.  GI as outlined.  Mammogram 05/07/11 - BiRADS II.  Has been seeing Dr Bary Castilla.  Just had a follow up mammogram.  States everything checked out fine.  Obtain results.

## 2012-08-17 NOTE — Assessment & Plan Note (Signed)
Continue supplements.  Follow vitamin D level.

## 2012-09-05 ENCOUNTER — Other Ambulatory Visit: Payer: PRIVATE HEALTH INSURANCE

## 2012-09-16 ENCOUNTER — Telehealth: Payer: Self-pay | Admitting: Internal Medicine

## 2012-09-16 ENCOUNTER — Other Ambulatory Visit: Payer: Self-pay | Admitting: Internal Medicine

## 2012-09-16 MED ORDER — ESOMEPRAZOLE MAGNESIUM 40 MG PO CPDR: 40.0000 mg | DELAYED_RELEASE_CAPSULE | Freq: Two times a day (BID) | ORAL | Status: DC

## 2012-09-16 MED ORDER — BUPROPION HCL ER (XL) 150 MG PO TB24: 150.0000 mg | ORAL_TABLET | Freq: Every day | ORAL | Status: DC

## 2012-09-16 MED ORDER — METOPROLOL SUCCINATE ER 50 MG PO TB24: 50.0000 mg | ORAL_TABLET | Freq: Every day | ORAL | Status: DC

## 2012-09-16 MED ORDER — LOSARTAN POTASSIUM 100 MG PO TABS: 100.0000 mg | ORAL_TABLET | Freq: Every day | ORAL | Status: DC

## 2012-09-16 MED ORDER — AMLODIPINE BESYLATE 5 MG PO TABS: 5.0000 mg | ORAL_TABLET | Freq: Every day | ORAL | Status: DC

## 2012-09-16 NOTE — Progress Notes (Signed)
Refilled nexium, amlodipine, toprol, wellbutrin and cozaar #90 with 3 refills.  rx printed for her to take to [facility].

## 2012-09-16 NOTE — Telephone Encounter (Signed)
I signed the rx's and put them in your basket.

## 2012-09-16 NOTE — Telephone Encounter (Signed)
Patient called stating she had faxed a refill request on multiple medications (5). Patient is wondering if you have rec'd this fax as she needs them by Friday.
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
## 2012-11-03 ENCOUNTER — Other Ambulatory Visit: Payer: Self-pay | Admitting: Internal Medicine

## 2012-11-07 ENCOUNTER — Telehealth: Payer: Self-pay | Admitting: Internal Medicine

## 2012-11-07 NOTE — Telephone Encounter (Signed)
Patient Information:  Caller Name: Cecely  Phone: [phone]  Patient: Amanda Wiley  Gender: Female  DOB: [birth year]  Age: 56 Years  PCP: Einar Pheasant  Office Follow Up:  Does the office need to follow up with this patient?: Yes  Instructions For The Office: PLEASE CONTACT PATIENT REGARDING ELEVATED BLOOD SUGAR AND HER INSULIN WHILE ON PREDNISONE 5 DAYS.  RN Note:  PLEASE CONTACT PATIENT REGARDING ELEVATED BLOOD SUGAR AND HER INSULIN WHILE ON PREDNISONE 5 DAYS.  Symptoms  Reason For Call & Symptoms: Patient is a diabetic and was seen by her company doctor yesterday for Asthma /Wheezing.  She was placed on Predisone Taper (33m).  She states this morning her Blood sugar 335.   She was given insulin Rx in the past by Dr. SNicki Reaperfor prednisone use.  She has not had to use.  It is not expired . The medication is - Humalog Pen.  She is on Lantis 14 units once daily in the morning.  Took Lantis at 07:00..  Now it is #299.  What should she do?  She is going to be on Prednisone 5 days.  Reviewed Health History In EMR: Yes  Reviewed Medications In EMR: Yes  Reviewed Allergies In EMR: Yes  Reviewed Surgeries / Procedures: Yes  Date of Onset of Symptoms: 11/07/2012  Treatments Tried: Lantis  Treatments Tried Worked: No  Guideline(s) Used:  Diabetes - High Blood Sugar  Disposition Per Guideline:   Discuss with PCP and Callback by Nurse Today  Reason For Disposition Reached:   Caller has NON-URGENT medication question about med that PCP prescribed and triager unable to answer question  Advice Given:  Treatment - Liquids  Drink at least one glass (8 oz or 240 ml) of water per hour for the next 4 hours. (Reason: adequate hydration will reduce hyperglycemia).  Generally, you should try to drink 6-8 glasses of water each day.  Treatment - Diabetes Medications  : Continue taking your diabetes pills.  Treatment - Insulin  Continue to take your insulin, as prescribed by your doctor.  Call  Back If:  Rapid breathing occurs  You become worse.  RN Overrode Recommendation:  Follow Up With Office Later  PLEASE CONTACT PATIENT REGARDING ELEVATED BLOOD SUGAR AND HER INSULIN WHILE ON PREDNISONE 5 DAYS.

## 2012-11-07 NOTE — Telephone Encounter (Signed)
After speaking with Dr. Nicki Reaper, I have informed Amanda Wiley that since she is only on the Prednisone taper for 5 days that her sugars should gradually improve. Stay hydrated. However, if she would like to proceed using the Humalog pen, the instructions are as follows:  Check sugars before eating- (Use Humalog only with meals-must eat after taking it) 200-250: 2 units 250-300: 4 units Over 300: 6 units   No matter how high her sugar readings are at bedtime-DO NOT USE HUMALOG PEN AT BEDTIME  Pt. verbalized understanding and wrote instructions down. Pt to notify office if she has an further concerns

## 2012-11-17 ENCOUNTER — Ambulatory Visit: Payer: PRIVATE HEALTH INSURANCE | Admitting: Internal Medicine

## 2012-12-05 ENCOUNTER — Emergency Department: Payer: Self-pay | Admitting: Emergency Medicine

## 2013-03-01 ENCOUNTER — Other Ambulatory Visit: Payer: Self-pay | Admitting: Internal Medicine

## 2013-04-06 ENCOUNTER — Ambulatory Visit (INDEPENDENT_AMBULATORY_CARE_PROVIDER_SITE_OTHER): Payer: PRIVATE HEALTH INSURANCE | Admitting: Internal Medicine

## 2013-04-06 ENCOUNTER — Encounter: Payer: Self-pay | Admitting: Internal Medicine

## 2013-04-06 VITALS — BP 138/80 | HR 90 | Temp 98.3°F | Ht 65.0 in | Wt 270.5 lb

## 2013-04-06 DIAGNOSIS — R0602 Shortness of breath: Secondary | ICD-10-CM

## 2013-04-06 DIAGNOSIS — J45909 Unspecified asthma, uncomplicated: Secondary | ICD-10-CM

## 2013-04-06 DIAGNOSIS — E119 Type 2 diabetes mellitus without complications: Secondary | ICD-10-CM

## 2013-04-06 DIAGNOSIS — J069 Acute upper respiratory infection, unspecified: Secondary | ICD-10-CM

## 2013-04-06 MED ORDER — PREDNISONE 10 MG PO TABS: ORAL_TABLET | ORAL | Status: DC

## 2013-04-06 MED ORDER — CEFDINIR 300 MG PO CAPS: 300.0000 mg | ORAL_CAPSULE | Freq: Two times a day (BID) | ORAL | Status: DC

## 2013-04-06 MED ORDER — ALBUTEROL SULFATE (2.5 MG/3ML) 0.083% IN NEBU
2.5000 mg | INHALATION_SOLUTION | Freq: Once | RESPIRATORY_TRACT | Status: AC
Start: 2013-04-06 — End: 2013-04-06
  Administered 2013-04-06: 2.5 mg via RESPIRATORY_TRACT

## 2013-04-06 NOTE — Progress Notes (Signed)
Pre-visit discussion using our clinic review tool. No additional management support is needed unless otherwise documented below in the visit note.  

## 2013-04-09 ENCOUNTER — Encounter: Payer: Self-pay | Admitting: Internal Medicine

## 2013-04-09 ENCOUNTER — Emergency Department: Payer: Self-pay | Admitting: Emergency Medicine

## 2013-04-09 LAB — BASIC METABOLIC PANEL
Anion Gap: 9 (ref 7–16)
BUN: 21 mg/dL — ABNORMAL HIGH (ref 7–18)
CHLORIDE: 105 mmol/L (ref 98–107)
CREATININE: 1.11 mg/dL (ref 0.60–1.30)
Calcium, Total: 8.5 mg/dL (ref 8.5–10.1)
Co2: 23 mmol/L (ref 21–32)
GFR CALC NON AF AMER: 55 — AB
Glucose: 276 mg/dL — ABNORMAL HIGH (ref 65–99)
Osmolality: 287 (ref 275–301)
POTASSIUM: 3.8 mmol/L (ref 3.5–5.1)
SODIUM: 137 mmol/L (ref 136–145)

## 2013-04-09 LAB — CBC
HCT: 44.4 % (ref 35.0–47.0)
HGB: 14.8 g/dL (ref 12.0–16.0)
MCH: 28.4 pg (ref 26.0–34.0)
MCHC: 33.2 g/dL (ref 32.0–36.0)
MCV: 85 fL (ref 80–100)
Platelet: 174 10*3/uL (ref 150–440)
RBC: 5.2 10*6/uL (ref 3.80–5.20)
RDW: 15.4 % — AB (ref 11.5–14.5)
WBC: 9.2 10*3/uL (ref 3.6–11.0)

## 2013-04-09 LAB — TROPONIN I

## 2013-04-09 NOTE — Assessment & Plan Note (Signed)
Treat current infection as outlined.

## 2013-04-09 NOTE — Assessment & Plan Note (Signed)
Cough and congestion as outlined.  Continue inhalers and nebulizer as directed.  Was given an albuterol neb here in the office.  Tolerated well.  Increased air movement.  Treat with omnicef as directed.  Mucinex as directed.  Saline nasal spray and flonase as directed.  Prednisone taper starting at 103m and decreasing by 540muntil off.  Follow.  Rest.  Fluids.  Explained to her if symptoms worsened or do not resolve - needs to be reevaluated.

## 2013-04-09 NOTE — Progress Notes (Signed)
Subjective:    [name], female    DOB: 09-15-1955, 58 y.o.   MRN: [id number]  Cough Associated symptoms include wheezing.  Wheezing  Associated symptoms include coughing.  58 year old female with past history of documented Crohn's disease, hypertension, hypercholesterolemia, diabetes and IBS who comes in today as a work in with concerns regarding increased cough, congestion and wheezing.  States symptoms started three weeks ago.  Worsening.  Increased cough.  Throat hurts from coughing.  Yellow mucus production.  Some runny nose and thick nasal congestion.  Increased chest congestion.  Wheezing.  Some chills.  Taking mucinex.  Using flonase and her inhalers.  Also using her nebulizer.  Trying to avoid carbs now.   Previously had not been watching her diet.  States am sugars averaging 170 and pm sugars 170s.  No vomiting.  No diarrhea.     Past Medical History  Diagnosis Date  . Hypertension   . Diabetes mellitus   . Hypercholesterolemia   . Crohn's disease   . IBS (irritable bowel syndrome)   . Depression   . Nephrolithiasis     s/p lithotripsy [facility])  . COPD (chronic obstructive pulmonary disease)     Current Outpatient Prescriptions on File Prior to Visit  Medication Sig Dispense Refill  . albuterol (PROVENTIL HFA;VENTOLIN HFA) 108 (90 BASE) MCG/ACT inhaler Inhale 2 puffs into the lungs every 6 (six) hours as needed.      Marland Kitchen albuterol (PROVENTIL) (2.5 MG/3ML) 0.083% nebulizer solution Take 3 mLs (2.5 mg total) by nebulization every 6 (six) hours as needed for wheezing.  150 mL  1  . amLODipine (NORVASC) 5 MG tablet Take 1 tablet (5 mg total) by mouth daily.  90 tablet  3  . B-D ULTRAFINE III SHORT PEN 31G X 8 MM MISC USE AS DIRECTED  100 each  3  . buPROPion (WELLBUTRIN XL) 150 MG 24 hr tablet Take 1 tablet (150 mg total) by mouth daily.  90 tablet  3  . esomeprazole (NEXIUM) 40 MG capsule Take 1 capsule (40 mg total) by mouth 2 (two) times daily.   180 capsule  3  . estrogens, conjugated, (PREMARIN) 0.3 MG tablet Take 0.3 mg by mouth daily. Take daily for 21 days then do not take for 7 days.      . fluticasone (FLONASE) 50 MCG/ACT nasal spray Place 2 sprays into the nose daily.      . fluticasone-salmeterol (ADVAIR HFA) 230-21 MCG/ACT inhaler Inhale 2 puffs into the lungs 2 (two) times daily.      . furosemide (LASIX) 20 MG tablet Take 1 tablet (20 mg total) by mouth daily.  90 tablet  3  . glimepiride (AMARYL) 2 MG tablet TAKE 2 TABLETS EVERY MORNING AND ONE TABLET EVERY EVENING  270 tablet  4  . hyoscyamine (LEVSIN SL) 0.125 MG SL tablet Place 0.125 mg under the tongue every 6 (six) hours as needed.      . insulin glargine (LANTUS) 100 UNIT/ML injection Inject 14 Units into the skin daily.      Marland Kitchen losartan (COZAAR) 100 MG tablet Take 1 tablet (100 mg total) by mouth daily.  90 tablet  3  . metFORMIN (GLUCOPHAGE) 500 MG tablet 2 tablets bid  360 tablet  3  . metoprolol succinate (TOPROL XL) 50 MG 24 hr tablet Take 1 tablet (50 mg total) by mouth daily. Take with or immediately following a meal.  90 tablet  3  . montelukast (SINGULAIR)  10 MG tablet Take 1 tablet (10 mg total) by mouth daily.  90 tablet  3  . pioglitazone (ACTOS) 30 MG tablet Take 1 tablet (30 mg total) by mouth daily.  90 tablet  3   No current facility-administered medications on file prior to visit.    Review of Systems  Respiratory: Positive for cough and wheezing.   Patient denies any headache, lightheadedness or dizziness.  Increased nasal congestion and sore throat.  Increased chest congestion, cough and wheezing.  Chest soreness from coughing.   No chest palpitations.  No vomiting.  No constipation, BRBPR or melana.  On Carafate.  Abdominal pain improved.  Bowels better.  Planning to join [employer].  Sugars as outlined.  Using her inhalers, nebulizer and mucinex/flonase.       Objective:   Physical Exam  Filed Vitals:   04/06/13 1522  BP: 138/80  Pulse:  90  Temp: 98.3 F (35.28 C)   58 year old female in no acute distress.   HEENT:  Nares- slightly erythematous turbinates.  Oropharynx - without lesions.  TMs visualized - without erythema.   NECK:  Supple.  Nontender.  HEART:  Appears to be regular. LUNGS:  No crackles.  Increased congestion and coughing.  Wheezing.  Increased air movement.           Assessment & Plan:  CARDIOVASCULAR.  Asymptomatic.  Continue risk factor modification.   HEALTH MAINTENANCE.  Physical 06/04/12.  GI as outlined.  Mammogram 05/07/11 - BiRADS II.  Has been seeing Dr Bary Castilla.  Just had a follow up mammogram.  States everything checked out fine.

## 2013-04-09 NOTE — Assessment & Plan Note (Signed)
Sugars as outlined.  On Lantus 14 units.  Follow sugars.  Discussed the importance of diet, exercise and weight loss.  Has humalog insulin.  SSI with prednisone usage.  Follow.

## 2013-05-27 ENCOUNTER — Telehealth: Payer: Self-pay | Admitting: Internal Medicine

## 2013-05-27 ENCOUNTER — Encounter: Payer: Self-pay | Admitting: Family Medicine

## 2013-05-27 ENCOUNTER — Ambulatory Visit (INDEPENDENT_AMBULATORY_CARE_PROVIDER_SITE_OTHER): Payer: PRIVATE HEALTH INSURANCE | Admitting: Family Medicine

## 2013-05-27 VITALS — BP 134/78 | HR 100 | Temp 98.0°F | Wt 270.0 lb

## 2013-05-27 DIAGNOSIS — J441 Chronic obstructive pulmonary disease with (acute) exacerbation: Secondary | ICD-10-CM | POA: Insufficient documentation

## 2013-05-27 MED ORDER — PREDNISONE 20 MG PO TABS: ORAL_TABLET | ORAL | Status: DC

## 2013-05-27 MED ORDER — DOXYCYCLINE HYCLATE 100 MG PO CAPS
100.0000 mg | ORAL_CAPSULE | Freq: Two times a day (BID) | ORAL | Status: DC
Start: 2013-05-27 — End: 2013-07-16

## 2013-05-27 MED ORDER — IPRATROPIUM BROMIDE 0.02 % IN SOLN
0.5000 mg | Freq: Once | RESPIRATORY_TRACT | Status: AC
  Administered 2013-05-27: 0.5 mg via RESPIRATORY_TRACT

## 2013-05-27 MED ORDER — ALBUTEROL SULFATE (2.5 MG/3ML) 0.083% IN NEBU
2.5000 mg | INHALATION_SOLUTION | Freq: Once | RESPIRATORY_TRACT | Status: AC
  Administered 2013-05-27: 2.5 mg via RESPIRATORY_TRACT

## 2013-05-27 NOTE — Telephone Encounter (Signed)
FYI- being seen at [facility], in office now

## 2013-05-27 NOTE — Progress Notes (Signed)
Pre-visit discussion using our clinic review tool. No additional management support is needed unless otherwise documented below in the visit note.  

## 2013-05-27 NOTE — Patient Instructions (Signed)
I think you have COPD exacerbation Neb treatment done in office today. Treat with doxycycline course as antibiotic, and prednisone taper as well. If worsening wheezing or trouble wheezing, please seek care at urgent care or ER. Let us know if not improving as expected. Use albuterol inhaler or neb every 4-6 hours for next 2-3 days.

## 2013-05-27 NOTE — Progress Notes (Signed)
BP 134/78  Pulse 100  Temp(Src) 98 F (36.7 C) (Oral)  Wt 270 lb (122.471 kg)  SpO2 96%   CC: cough, trouble breathing  Subjective:    [name], female    DOB: [birth year], 58 y.o.   MRN: [id number]  HPI: Chiann Goffredo is a 58 y.o. female presenting on 05/27/2013 with Cough  3d h/o wheezing and dyspnea.  Some chills and felt feverish.  Cough productive of colored sputum.  Increased cough, sputum production, and dyspnea  No abd pain, ear or tooth pain.  Recently had root canal.  Carries headache.  No sick contacts at home.  No smokers at home.  Did receive flu shot this year.  H/o COPD , takes advair regularly as well as albuterol prn and singulair daily.  Last albuterol use was noon. H/o diabetes, HLD ,HTN.  Last cbg was earlier today - 180s   Last prednisone use was new year's - recently evaluated for difficulty breathing at [facility] treated with IV magnesium after 4-5 treatments.  Relevant past medical, surgical, family and social history reviewed and updated. Allergies and medications reviewed and updated. Current Outpatient Prescriptions on File Prior to Visit  Medication Sig  . albuterol (PROVENTIL HFA;VENTOLIN HFA) 108 (90 BASE) MCG/ACT inhaler Inhale 2 puffs into the lungs every 6 (six) hours as needed.  Marland Kitchen albuterol (PROVENTIL) (2.5 MG/3ML) 0.083% nebulizer solution Take 3 mLs (2.5 mg total) by nebulization every 6 (six) hours as needed for wheezing.  Marland Kitchen amLODipine (NORVASC) 5 MG tablet Take 1 tablet (5 mg total) by mouth daily.  . B-D ULTRAFINE III SHORT PEN 31G X 8 MM MISC USE AS DIRECTED  . buPROPion (WELLBUTRIN XL) 150 MG 24 hr tablet Take 1 tablet (150 mg total) by mouth daily.  Marland Kitchen esomeprazole (NEXIUM) 40 MG capsule Take 1 capsule (40 mg total) by mouth 2 (two) times daily.  Marland Kitchen estrogens, conjugated, (PREMARIN) 0.3 MG tablet Take 0.3 mg by mouth daily. Take daily for 21 days then do not take for 7 days.  . fluticasone (FLONASE) 50 MCG/ACT nasal spray Place 2  sprays into the nose daily.  . fluticasone-salmeterol (ADVAIR HFA) 230-21 MCG/ACT inhaler Inhale 2 puffs into the lungs 2 (two) times daily.  . furosemide (LASIX) 20 MG tablet Take 1 tablet (20 mg total) by mouth daily.  Marland Kitchen glimepiride (AMARYL) 2 MG tablet TAKE 2 TABLETS EVERY MORNING AND ONE TABLET EVERY EVENING  . hyoscyamine (LEVSIN SL) 0.125 MG SL tablet Place 0.125 mg under the tongue every 6 (six) hours as needed.  . insulin glargine (LANTUS) 100 UNIT/ML injection Inject 14 Units into the skin daily.  Marland Kitchen losartan (COZAAR) 100 MG tablet Take 1 tablet (100 mg total) by mouth daily.  . metFORMIN (GLUCOPHAGE) 500 MG tablet 2 tablets bid  . metoprolol succinate (TOPROL XL) 50 MG 24 hr tablet Take 1 tablet (50 mg total) by mouth daily. Take with or immediately following a meal.  . montelukast (SINGULAIR) 10 MG tablet Take 1 tablet (10 mg total) by mouth daily.  . pioglitazone (ACTOS) 30 MG tablet Take 1 tablet (30 mg total) by mouth daily.   No current facility-administered medications on file prior to visit.    Review of Systems Per HPI unless specifically indicated above    Objective:    BP 134/78  Pulse 100  Temp(Src) 98 F (36.7 C) (Oral)  Wt 270 lb (122.471 kg)  SpO2 96%  Physical Exam  Nursing note and vitals reviewed. Constitutional: She  appears well-developed and well-nourished. No distress.  obese  HENT:  Mouth/Throat: Oropharynx is clear and moist. No oropharyngeal exudate.  Cardiovascular: Normal rate, regular rhythm, normal heart sounds and intact distal pulses.   No murmur heard. Pulmonary/Chest: Effort normal. No respiratory distress. She has no decreased breath sounds. She has wheezes (faint exp wheezing R>L). She has no rhonchi. She has no rales.  Overall good air movement  Musculoskeletal: She exhibits no edema.   After alb/atrovent neb - subjective improvement in air movement, and no wheezing    Assessment & Plan:   Problem List Items Addressed This Visit    COPD exacerbation - Primary     Anticipate COPD exacerbation - will treat with alb/atrovent neb in office then start course of prednisone + doxycycline 10d course. Pt states she does better with 48m prednisone to start so have sent in 629mwith taper. Discussed need to avoid carbs while on steroids.  Pt states she has insulin to use when on prednisone. Advised take albuterol inhaler or neb every 4 hours for next 2-3 days. Red flags to return discussed, as well as indications to seek ER/UCC eval. Pt agrees with plan.    Relevant Medications      albuterol (PROVENTIL) (2.5 MG/3ML) 0.083% nebulizer solution 2.5 mg (Completed) (Start on 05/27/2013  2:45 PM)      ipratropium (ATROVENT) nebulizer solution 0.5 mg (Completed) (Start on 05/27/2013  2:45 PM)      predniSONE (DELTASONE) tablet       Follow up plan: Return if symptoms worsen or fail to improve.

## 2013-05-27 NOTE — Telephone Encounter (Signed)
Noted  

## 2013-05-27 NOTE — Assessment & Plan Note (Addendum)
Anticipate COPD exacerbation - will treat with alb/atrovent neb in office then start course of prednisone + doxycycline 10d course. Pt states she does better with 32m prednisone to start so have sent in 632mwith taper. Discussed need to avoid carbs while on steroids.  Pt states she has insulin to use when on prednisone. Advised take albuterol inhaler or neb every 4 hours for next 2-3 days. Red flags to return discussed, as well as indications to seek ER/UCC eval. Pt agrees with plan.

## 2013-05-27 NOTE — Telephone Encounter (Signed)
Patient Information:  Caller Name: Jakyiah  Phone: [phone]  Patient: Amanda Wiley, Amanda Wiley  Gender: Female  DOB: [birth year]  Age: 58 Years  PCP: Einar Pheasant  Office Follow Up:  Does the office need to follow up with this patient?: No  Instructions For The Office: N/A  RN Note:  Chills with suspected fever.  No thermometer.  Audible wheezing with shortness of breath. Cough with green phlem.  Using nebulizer and MDI frequently.  Chest feels "really tight."  FBS not tested recently. No appointments remain at [facility]  Scheduled for 1315 05/27/12 with Dr. Danise Mina at [facility].   Symptoms  Reason For Call & Symptoms: Suspected COPD excerbation.  Audible wheezing.  Cough and congestion present for past 3 days.  Reviewed Health History In EMR: Yes  Reviewed Medications In EMR: Yes  Reviewed Allergies In EMR: Yes  Reviewed Surgeries / Procedures: Yes  Date of Onset of Symptoms: 05/24/2013  Treatments Tried: Albuterol Neb, Advair BID, Albuterol MDI,  Treatments Tried Worked: No  Any Fever: Yes  Fever Taken: Tactile  Fever Time Of Reading: 11:00:00  Fever Last Reading: N/A  Guideline(s) Used:  Breathing Difficulty  Asthma Attack  Disposition Per Guideline:   Go to Office Now  Reason For Disposition Reached:   Moderate asthma attack (e.g., SOB at rest, speaks in phrases, audible wheezes) and not resolved after 2 nebulizer or inhaler treatments given 20 minutes apart  Advice Given:  Quick-Relief Asthma Medicine:   Start your quick-relief medicine (e.g., albuterol, salbutamol) at the first sign of any coughing or shortness of breath (don't wait for wheezing). Use your inhaler (2 puffs each time) or nebulizer every 4 hours. Continue the quick-relief medicine until you have not wheezed or coughed for 48 hours.  The best "cough medicine" for an adult with asthma is always the asthma medicine (Note: Don't use cough suppressants, but cough drops may help a tickly cough).  Long-Term-Control Asthma Medicine:  If you are using a controller medicine (e.g., inhaled steroids or cromolyn), continue to take it as directed.  Drinking Liquids:  Try to drink normal amount of liquids (e.g., water). Being adequately hydrated makes it easier to cough up the sticky lung mucus.  Humidifier:   If the air is dry, use a cool mist humidifier to prevent drying of the upper airway.  Call Back If:  Inhaled asthma medicine (nebulizer or inhaler) is needed more often than every 4 hours  Wheezing has not completely cleared after 5 days  You become worse.  Patient Will Follow Care Advice:  YES

## 2013-06-18 ENCOUNTER — Telehealth: Payer: Self-pay | Admitting: Internal Medicine

## 2013-06-18 ENCOUNTER — Other Ambulatory Visit: Payer: Self-pay | Admitting: Internal Medicine

## 2013-06-18 NOTE — Telephone Encounter (Signed)
Okay to refill? If, so I can print the Rx's for you to sign

## 2013-06-18 NOTE — Telephone Encounter (Signed)
Pt states she needs written prescriptions that she takes to [facility]:  Glucophage 500 mg 2 tabs twice daily #360, one year refill. Actos 30 mg 1 daily #90, 3 refills Lasix 20 mg 1 daily. #90, 3 refills  Please contact pt at work when ready for pick up.

## 2013-06-18 NOTE — Telephone Encounter (Signed)
I am ok to refill, but she needs a physical scheduled.

## 2013-06-19 ENCOUNTER — Other Ambulatory Visit: Payer: Self-pay | Admitting: *Deleted

## 2013-06-19 MED ORDER — PIOGLITAZONE HCL 30 MG PO TABS: 30.0000 mg | ORAL_TABLET | Freq: Every day | ORAL | Status: DC

## 2013-06-19 MED ORDER — FUROSEMIDE 20 MG PO TABS: 20.0000 mg | ORAL_TABLET | Freq: Every day | ORAL | Status: DC

## 2013-06-19 MED ORDER — METFORMIN HCL 500 MG PO TABS: ORAL_TABLET | ORAL | Status: DC

## 2013-06-19 NOTE — Telephone Encounter (Signed)
Pt notified & Rx placed up front for pick up

## 2013-06-30 LAB — HM MAMMOGRAPHY

## 2013-07-01 ENCOUNTER — Encounter: Payer: Self-pay | Admitting: Internal Medicine

## 2013-07-02 ENCOUNTER — Encounter: Payer: Self-pay | Admitting: Adult Health

## 2013-07-02 ENCOUNTER — Ambulatory Visit (INDEPENDENT_AMBULATORY_CARE_PROVIDER_SITE_OTHER): Payer: PRIVATE HEALTH INSURANCE | Admitting: Adult Health

## 2013-07-02 VITALS — BP 152/84 | HR 91 | Temp 98.4°F | Wt 268.0 lb

## 2013-07-02 DIAGNOSIS — IMO0001 Reserved for inherently not codable concepts without codable children: Secondary | ICD-10-CM

## 2013-07-02 DIAGNOSIS — R350 Frequency of micturition: Secondary | ICD-10-CM

## 2013-07-02 DIAGNOSIS — R1032 Left lower quadrant pain: Secondary | ICD-10-CM

## 2013-07-02 DIAGNOSIS — N390 Urinary tract infection, site not specified: Secondary | ICD-10-CM

## 2013-07-02 LAB — POCT URINALYSIS DIPSTICK
Bilirubin, UA: NEGATIVE
GLUCOSE UA: 250
Ketones, UA: NEGATIVE
Nitrite, UA: NEGATIVE
PROTEIN UA: NEGATIVE
RBC UA: NEGATIVE
Spec Grav, UA: >=1.03
Urobilinogen, UA: 0.2
pH, UA: 5.5

## 2013-07-02 MED ORDER — METRONIDAZOLE 500 MG PO TABS: 500.0000 mg | ORAL_TABLET | Freq: Three times a day (TID) | ORAL | Status: DC

## 2013-07-02 MED ORDER — AMOXICILLIN-POT CLAVULANATE 500-125 MG PO TABS: 1.0000 | ORAL_TABLET | Freq: Three times a day (TID) | ORAL | Status: DC

## 2013-07-02 NOTE — Progress Notes (Signed)
Pre visit review using our clinic review tool, if applicable. No additional management support is needed unless otherwise documented below in the visit note. 

## 2013-07-02 NOTE — Progress Notes (Signed)
[name], female   DOB: March 09, 1956, 58 y.o.   MRN: [id number]    Subjective:    [name], female    DOB: 1955/12/10, 58 y.o.   MRN: [id number]  HPI Patient is a pleasant 58 year old female who presents to clinic with left lower quadrant pain that has been ongoing for approximately one week. She reports pain is sharp and intermittent. Pain was at its worse yesterday. Reports improvement of pain today. Patient has history of Crohn's and diverticulitis. She has had approximately 4 diarrhea stools today. Denies any bloody stool. Pt has been eating more salads than usual. She has been trying to lose some weight. She is also wondering if she may have a urinary tract infection secondary to experiencing urgency and frequency. No fever, hematuria.   Past Medical History  Diagnosis Date  . Hypertension   . Diabetes mellitus   . Hypercholesterolemia   . Crohn's disease   . IBS (irritable bowel syndrome)   . Depression   . Nephrolithiasis     s/p lithotripsy [facility])  . COPD (chronic obstructive pulmonary disease)     Current Outpatient Prescriptions on File Prior to Visit  Medication Sig Dispense Refill  . albuterol (PROVENTIL HFA;VENTOLIN HFA) 108 (90 BASE) MCG/ACT inhaler Inhale 2 puffs into the lungs every 6 (six) hours as needed.      Marland Kitchen albuterol (PROVENTIL) (2.5 MG/3ML) 0.083% nebulizer solution Take 3 mLs (2.5 mg total) by nebulization every 6 (six) hours as needed for wheezing.  150 mL  1  . amLODipine (NORVASC) 5 MG tablet Take 1 tablet (5 mg total) by mouth daily.  90 tablet  3  . B-D ULTRAFINE III SHORT PEN 31G X 8 MM MISC USE AS DIRECTED  100 each  3  . buPROPion (WELLBUTRIN XL) 150 MG 24 hr tablet Take 1 tablet (150 mg total) by mouth daily.  90 tablet  3  . doxycycline (VIBRAMYCIN) 100 MG capsule Take 1 capsule (100 mg total) by mouth 2 (two) times daily.  20 capsule  0  . esomeprazole (NEXIUM) 40 MG capsule Take 1 capsule (40 mg total)  by mouth 2 (two) times daily.  180 capsule  3  . fluticasone (FLONASE) 50 MCG/ACT nasal spray Place 2 sprays into the nose daily.      . fluticasone-salmeterol (ADVAIR HFA) 230-21 MCG/ACT inhaler Inhale 2 puffs into the lungs 2 (two) times daily.      . furosemide (LASIX) 20 MG tablet Take 1 tablet (20 mg total) by mouth daily.  90 tablet  3  . glimepiride (AMARYL) 2 MG tablet TAKE 2 TABLETS EVERY MORNING AND ONE TABLET EVERY EVENING  270 tablet  4  . hyoscyamine (LEVSIN SL) 0.125 MG SL tablet Place 0.125 mg under the tongue every 6 (six) hours as needed.      . insulin glargine (LANTUS) 100 UNIT/ML injection Inject 14 Units into the skin daily.      Marland Kitchen LANTUS SOLOSTAR 100 UNIT/ML Solostar Pen INJECT 10 UNITS EVERY MORNING  15 mL  3  . losartan (COZAAR) 100 MG tablet Take 1 tablet (100 mg total) by mouth daily.  90 tablet  3  . metFORMIN (GLUCOPHAGE) 500 MG tablet 2 tablets bid  360 tablet  3  . metoprolol succinate (TOPROL XL) 50 MG 24 hr tablet Take 1 tablet (50 mg total) by mouth daily. Take with or immediately following a meal.  90 tablet  3  . montelukast (SINGULAIR) 10 MG  tablet Take 1 tablet (10 mg total) by mouth daily.  90 tablet  3  . pioglitazone (ACTOS) 30 MG tablet Take 1 tablet (30 mg total) by mouth daily.  90 tablet  3   No current facility-administered medications on file prior to visit.     Review of Systems  Constitutional: Negative for fever and chills.  Respiratory: Negative.   Cardiovascular: Negative.   Gastrointestinal: Positive for abdominal pain, diarrhea and abdominal distention. Negative for nausea, vomiting, constipation, blood in stool, anal bleeding and rectal pain.  Genitourinary: Positive for urgency and frequency. Negative for dysuria, hematuria, flank pain and difficulty urinating.  All other systems reviewed and are negative.       Objective:  BP 152/84  Pulse 91  Temp(Src) 98.4 F (36.9 C) (Oral)  Wt 268 lb (121.564 kg)  SpO2 94%   Physical  Exam  Constitutional: She is oriented to person, place, and time. No distress.  Cardiovascular: Normal rate and regular rhythm.   Pulmonary/Chest: Effort normal. No respiratory distress.  Abdominal: Soft. Bowel sounds are normal. She exhibits distension. She exhibits no mass. There is tenderness. There is no rebound and no guarding.  Musculoskeletal: Normal range of motion.  Neurological: She is alert and oriented to person, place, and time.  Skin: Skin is warm and dry.  Psychiatric: She has a normal mood and affect. Her behavior is normal. Judgment and thought content normal.      Assessment & Plan:   1. LLQ pain Suspect diverticulitis. Abdominal discomfort is slightly improved today. Check cbc and bmet. Start Augmentin 500 mg tid x 10 day and Metronidazole 500 mg tid x 10 days. RTC for f/u in 1 week or sooner if necessary.  - CBC with Differential - Basic metabolic panel  2. Frequency UA with leukocytes. Send for culture. Started on Augmentin for suspected diverticulitis which should cover for UTI.  - POCT urinalysis dipstick - Urine culture

## 2013-07-02 NOTE — Patient Instructions (Signed)
  Clear liquid diet for 24 hours. Monitor blood glucose and adjust insulin dose if blood sugar drops.  Start Augmentin 3 times a day for 10 days and Metronidazole 500 mg 3 times a day for 10 days.  Return for follow up in 1 week or sooner.  If you develop severe abdominal pain, fever of >101, bloody diarrhea or any worsening symptoms please go to the ED.

## 2013-07-03 LAB — BASIC METABOLIC PANEL
BUN: 12 mg/dL (ref 6–23)
CO2: 25 meq/L (ref 19–32)
Calcium: 9.5 mg/dL (ref 8.4–10.5)
Chloride: 102 mEq/L (ref 96–112)
Creatinine, Ser: 0.9 mg/dL (ref 0.4–1.2)
GFR: 68.25 mL/min (ref >60.00)
GLUCOSE: 192 mg/dL — AB (ref 70–99)
POTASSIUM: 4.7 meq/L (ref 3.5–5.1)
SODIUM: 138 meq/L (ref 135–145)

## 2013-07-03 LAB — CBC WITH DIFFERENTIAL/PLATELET
Basophils Absolute: 0.1 10*3/uL (ref 0.0–0.1)
Basophils Relative: 1 % (ref 0.0–3.0)
EOS PCT: 3.8 % (ref 0.0–5.0)
Eosinophils Absolute: 0.3 10*3/uL (ref 0.0–0.7)
HCT: 42.9 % (ref 36.0–46.0)
Hemoglobin: 14.2 g/dL (ref 12.0–15.0)
Lymphocytes Relative: 24 % (ref 12.0–46.0)
Lymphs Abs: 2.1 10*3/uL (ref 0.7–4.0)
MCHC: 33.1 g/dL (ref 30.0–36.0)
MCV: 85.9 fl (ref 78.0–100.0)
MONOS PCT: 7.6 % (ref 3.0–12.0)
Monocytes Absolute: 0.7 10*3/uL (ref 0.1–1.0)
NEUTROS PCT: 63.6 % (ref 43.0–77.0)
Neutro Abs: 5.7 10*3/uL (ref 1.4–7.7)
Platelets: 194 10*3/uL (ref 150.0–400.0)
RBC: 4.99 Mil/uL (ref 3.87–5.11)
RDW: 15.9 % — ABNORMAL HIGH (ref 11.5–14.6)
WBC: 9 10*3/uL (ref 4.5–10.5)

## 2013-07-04 LAB — URINE CULTURE: Colony Count: 85000

## 2013-07-07 NOTE — Progress Notes (Signed)
Notified pt. 

## 2013-07-16 ENCOUNTER — Encounter: Payer: Self-pay | Admitting: Internal Medicine

## 2013-07-16 ENCOUNTER — Ambulatory Visit (INDEPENDENT_AMBULATORY_CARE_PROVIDER_SITE_OTHER): Payer: PRIVATE HEALTH INSURANCE | Admitting: Internal Medicine

## 2013-07-16 VITALS — BP 134/70 | HR 93 | Temp 98.0°F | Wt 268.0 lb

## 2013-07-16 DIAGNOSIS — K219 Gastro-esophageal reflux disease without esophagitis: Secondary | ICD-10-CM

## 2013-07-16 DIAGNOSIS — J45909 Unspecified asthma, uncomplicated: Secondary | ICD-10-CM

## 2013-07-16 DIAGNOSIS — E119 Type 2 diabetes mellitus without complications: Secondary | ICD-10-CM

## 2013-07-16 DIAGNOSIS — N390 Urinary tract infection, site not specified: Secondary | ICD-10-CM

## 2013-07-16 DIAGNOSIS — I10 Essential (primary) hypertension: Secondary | ICD-10-CM

## 2013-07-16 DIAGNOSIS — E559 Vitamin D deficiency, unspecified: Secondary | ICD-10-CM

## 2013-07-16 DIAGNOSIS — E7800 Pure hypercholesterolemia, unspecified: Secondary | ICD-10-CM

## 2013-07-16 DIAGNOSIS — R1032 Left lower quadrant pain: Secondary | ICD-10-CM

## 2013-07-16 DIAGNOSIS — K5090 Crohn's disease, unspecified, without complications: Secondary | ICD-10-CM

## 2013-07-16 LAB — POCT URINALYSIS DIPSTICK
Bilirubin, UA: NEGATIVE
GLUCOSE UA: 100
Ketones, UA: NEGATIVE
Leukocytes, UA: NEGATIVE
Nitrite, UA: NEGATIVE
PROTEIN UA: NEGATIVE
SPEC GRAV UA: 1.02
UROBILINOGEN UA: 0.2
pH, UA: 5

## 2013-07-16 LAB — URINALYSIS, ROUTINE W REFLEX MICROSCOPIC
BILIRUBIN URINE: NEGATIVE
KETONES UR: NEGATIVE
Leukocytes, UA: NEGATIVE
Nitrite: NEGATIVE
Specific Gravity, Urine: 1.02 (ref 1.000–1.030)
TOTAL PROTEIN, URINE-UPE24: NEGATIVE
URINE GLUCOSE: 100 — AB
UROBILINOGEN UA: 0.2 (ref 0.0–1.0)
pH: 5.5 (ref 5.0–8.0)

## 2013-07-16 MED ORDER — FLUCONAZOLE 150 MG PO TABS: ORAL_TABLET | ORAL | Status: DC

## 2013-07-16 NOTE — Progress Notes (Signed)
Pre visit review using our clinic review tool, if applicable. No additional management support is needed unless otherwise documented below in the visit note. 

## 2013-07-19 ENCOUNTER — Encounter: Payer: Self-pay | Admitting: Internal Medicine

## 2013-07-19 LAB — URINE CULTURE

## 2013-07-19 NOTE — Assessment & Plan Note (Signed)
Breathing stable.  Follow.

## 2013-07-19 NOTE — Assessment & Plan Note (Signed)
Blood pressure doing well.  Same meds.  Follow metabolic panel.

## 2013-07-19 NOTE — Assessment & Plan Note (Signed)
Low cholesterol diet and exercise.  Declines medication.  Check lipid panel with next fasting labs.

## 2013-07-19 NOTE — Assessment & Plan Note (Signed)
Continue supplements.  Follow vitamin D level.

## 2013-07-19 NOTE — Assessment & Plan Note (Signed)
Just had colonoscopy.  Revealed a 45m polyp, diverticulosis and internal hemorrhoids.  Abdominal pain resolved.  Follow.

## 2013-07-19 NOTE — Assessment & Plan Note (Signed)
Sugars as outlined.  On Lantus 14 units.  Follow sugars.  Discussed the importance of diet, exercise and weight loss.  We discussed titrating up the lantus if the pm sugars remain elevated above 200.  Overdue labs.  Check metabolic panel and A2Z.   Follow.

## 2013-07-19 NOTE — Assessment & Plan Note (Signed)
Resolved

## 2013-07-19 NOTE — Assessment & Plan Note (Signed)
EGD 06/27/07 revealed a single gastric polyp with some gastric mucosal erythema.  Just had follow up EGD.  Placed on carafate.  Upper symptoms appear to be controlled.  Follow.

## 2013-07-19 NOTE — Progress Notes (Signed)
Subjective:    [name], female    DOB: 1955/07/28, 58 y.o.   MRN: [id number]  HPI 58 year old female with past history of documented Crohn's disease, hypertension, hypercholesterolemia, diabetes and IBS who comes in today for a scheduled follow up.   She was recently seen by Raquel Rey for LLQ pain.  Was treated for diverticulitis with augmentin and flagyl.  After about three days of abx, the pain resolved.  No change in bowel movements.  No fever.  She now reports increased yeast in the vaginal and rectal area.  She is going to try to do better watching her diet.  Brought in no recorded sugar readings.  She had been having increased sugars recently despite multiple medications.  Have discussed diet and exercise and weight loss.  On Lantus now.  Sugars before breakfast - 130-140.  Pm sugars 200.   Breathing stable.  No nausea or vomiting.   Had a recent colonoscopy.  Revealed diverticulosis and internal hemorrhoids.  The abdominal pain and discomfort have essentially resolved.  Overall she feels she is doing relatively well.    Past Medical History  Diagnosis Date  . Hypertension   . Diabetes mellitus   . Hypercholesterolemia   . Crohn's disease   . IBS (irritable bowel syndrome)   . Depression   . Nephrolithiasis     s/p lithotripsy [facility])  . COPD (chronic obstructive pulmonary disease)     Current Outpatient Prescriptions on File Prior to Visit  Medication Sig Dispense Refill  . albuterol (PROVENTIL HFA;VENTOLIN HFA) 108 (90 BASE) MCG/ACT inhaler Inhale 2 puffs into the lungs every 6 (six) hours as needed.      Marland Kitchen albuterol (PROVENTIL) (2.5 MG/3ML) 0.083% nebulizer solution Take 3 mLs (2.5 mg total) by nebulization every 6 (six) hours as needed for wheezing.  150 mL  1  . amLODipine (NORVASC) 5 MG tablet Take 1 tablet (5 mg total) by mouth daily.  90 tablet  3  . B-D ULTRAFINE III SHORT PEN 31G X 8 MM MISC USE AS DIRECTED  100 each  3  . buPROPion  (WELLBUTRIN XL) 150 MG 24 hr tablet Take 1 tablet (150 mg total) by mouth daily.  90 tablet  3  . esomeprazole (NEXIUM) 40 MG capsule Take 1 capsule (40 mg total) by mouth 2 (two) times daily.  180 capsule  3  . fluticasone (FLONASE) 50 MCG/ACT nasal spray Place 2 sprays into the nose daily.      . fluticasone-salmeterol (ADVAIR HFA) 230-21 MCG/ACT inhaler Inhale 2 puffs into the lungs 2 (two) times daily.      . furosemide (LASIX) 20 MG tablet Take 1 tablet (20 mg total) by mouth daily.  90 tablet  3  . glimepiride (AMARYL) 2 MG tablet TAKE 2 TABLETS EVERY MORNING AND ONE TABLET EVERY EVENING  270 tablet  4  . hyoscyamine (LEVSIN SL) 0.125 MG SL tablet Place 0.125 mg under the tongue every 6 (six) hours as needed.      . insulin glargine (LANTUS) 100 UNIT/ML injection Inject 14 Units into the skin daily.      Marland Kitchen LANTUS SOLOSTAR 100 UNIT/ML Solostar Pen INJECT 10 UNITS EVERY MORNING  15 mL  3  . losartan (COZAAR) 100 MG tablet Take 1 tablet (100 mg total) by mouth daily.  90 tablet  3  . metFORMIN (GLUCOPHAGE) 500 MG tablet 2 tablets bid  360 tablet  3  . metoprolol succinate (TOPROL XL) 50  MG 24 hr tablet Take 1 tablet (50 mg total) by mouth daily. Take with or immediately following a meal.  90 tablet  3  . metroNIDAZOLE (FLAGYL) 500 MG tablet Take 1 tablet (500 mg total) by mouth 3 (three) times daily.  30 tablet  0  . montelukast (SINGULAIR) 10 MG tablet Take 1 tablet (10 mg total) by mouth daily.  90 tablet  3  . pioglitazone (ACTOS) 30 MG tablet Take 1 tablet (30 mg total) by mouth daily.  90 tablet  3   No current facility-administered medications on file prior to visit.    Review of Systems Patient denies any headache, lightheadedness or dizziness.  No significant sinus or allergy symptoms.  No chest pain, tightness or palpitations.  No increased shortness of breath, cough or congestion.  Breathing stable.  No vomiting.  No constipation, BRBPR or melana.  On Carafate.  Abdominal pain  improved.  Bowels better.  Sugars as outlined.       Objective:   Physical Exam  Filed Vitals:   07/16/13 0935  BP: 134/70  Pulse: 93  Temp: 98 F (9.64 C)   58 year old female in no acute distress.   HEENT:  Nares- clear.  Oropharynx - without lesions. NECK:  Supple.  Nontender.  No audible bruit.  HEART:  Appears to be regular. LUNGS:  No crackles or wheezing audible.  Respirations even and unlabored.  RADIAL PULSE:  Equal bilaterally.  ABDOMEN:  Soft, nontender.  Bowel sounds present and normal.  No audible abdominal bruit.    EXTREMITIES:  No increased edema present.  DP pulses palpable and equal bilaterally.             Assessment & Plan:  INCREASED PSYCHOSOCIAL STRESSORS.  On Wellbutrin.  Stable.    CARDIOVASCULAR.  Asymptomatic.  Continue risk factor modification.   ABDOMINAL PAIN.  Had a colonoscopy and EGD.  Was placed on carafate.  Recently treated for diverticulitis.   No significant abdominal pain now.  Bowels stable.  Follow.    HEALTH MAINTENANCE.  Physical 06/04/12.  GI as outlined.  Mammogram per documentation 06/30/13 - BiRADS II.  Has seen Dr Bary Castilla.  Overdue a physical.  Schedule.

## 2013-07-24 ENCOUNTER — Other Ambulatory Visit (INDEPENDENT_AMBULATORY_CARE_PROVIDER_SITE_OTHER): Payer: PRIVATE HEALTH INSURANCE

## 2013-07-24 DIAGNOSIS — E119 Type 2 diabetes mellitus without complications: Secondary | ICD-10-CM

## 2013-07-24 DIAGNOSIS — E559 Vitamin D deficiency, unspecified: Secondary | ICD-10-CM

## 2013-07-24 DIAGNOSIS — E7800 Pure hypercholesterolemia, unspecified: Secondary | ICD-10-CM

## 2013-07-24 DIAGNOSIS — I10 Essential (primary) hypertension: Secondary | ICD-10-CM

## 2013-07-24 LAB — LIPID PANEL
CHOL/HDL RATIO: 6
Cholesterol: 240 mg/dL — ABNORMAL HIGH (ref 0–200)
HDL: 39.8 mg/dL (ref >39.00)
LDL CALC: 148 mg/dL — AB (ref 0–99)
Triglycerides: 260 mg/dL — ABNORMAL HIGH (ref 0.0–149.0)
VLDL: 52 mg/dL — ABNORMAL HIGH (ref 0.0–40.0)

## 2013-07-24 LAB — HEPATIC FUNCTION PANEL
ALT: 27 U/L (ref 0–35)
AST: 25 U/L (ref 0–37)
Albumin: 3.6 g/dL (ref 3.5–5.2)
Alkaline Phosphatase: 61 U/L (ref 39–117)
Bilirubin, Direct: 0.1 mg/dL (ref 0.0–0.3)
Total Bilirubin: 0.7 mg/dL (ref 0.3–1.2)
Total Protein: 7.7 g/dL (ref 6.0–8.3)

## 2013-07-24 LAB — BASIC METABOLIC PANEL WITH GFR
BUN: 12 mg/dL (ref 6–23)
CO2: 27 meq/L (ref 19–32)
Calcium: 9.5 mg/dL (ref 8.4–10.5)
Chloride: 102 meq/L (ref 96–112)
Creatinine, Ser: 0.8 mg/dL (ref 0.4–1.2)
GFR: 84.21 mL/min
Glucose, Bld: 187 mg/dL — ABNORMAL HIGH (ref 70–99)
Potassium: 4.7 meq/L (ref 3.5–5.1)
Sodium: 140 meq/L (ref 135–145)

## 2013-07-24 LAB — TSH: TSH: 1.25 u[IU]/mL (ref 0.35–5.50)

## 2013-07-24 LAB — HEMOGLOBIN A1C: Hgb A1c MFr Bld: 8.8 % — ABNORMAL HIGH (ref 4.6–6.5)

## 2013-07-24 LAB — MICROALBUMIN / CREATININE URINE RATIO
CREATININE, U: 70.8 mg/dL
MICROALB UR: 0.1 mg/dL (ref 0.0–1.9)
Microalb Creat Ratio: 0.1 mg/g (ref 0.0–30.0)

## 2013-07-25 LAB — VITAMIN D 25 HYDROXY (VIT D DEFICIENCY, FRACTURES): VIT D 25 HYDROXY: 14 ng/mL — AB (ref 30–89)

## 2013-07-27 ENCOUNTER — Other Ambulatory Visit: Payer: Self-pay | Admitting: *Deleted

## 2013-07-27 MED ORDER — VITAMIN D (ERGOCALCIFEROL) 1.25 MG (50000 UNIT) PO CAPS: 50000.0000 [IU] | ORAL_CAPSULE | ORAL | Status: DC

## 2013-07-28 ENCOUNTER — Telehealth: Payer: Self-pay | Admitting: Internal Medicine

## 2013-07-28 ENCOUNTER — Other Ambulatory Visit: Payer: Self-pay | Admitting: Internal Medicine

## 2013-07-28 DIAGNOSIS — E119 Type 2 diabetes mellitus without complications: Secondary | ICD-10-CM

## 2013-07-28 MED ORDER — PRAVASTATIN SODIUM 10 MG PO TABS: 10.0000 mg | ORAL_TABLET | Freq: Every day | ORAL | Status: DC

## 2013-07-28 NOTE — Progress Notes (Signed)
rx sent in for pravastatin 57m q day to [facility] [place]

## 2013-07-28 NOTE — Telephone Encounter (Signed)
Pt notified of lab results.  See lab message.  Refer to endocrinology for further evaluation and treatment.  Order placed for referral.

## 2013-07-29 ENCOUNTER — Encounter: Payer: Self-pay | Admitting: Internal Medicine

## 2013-08-12 ENCOUNTER — Ambulatory Visit (INDEPENDENT_AMBULATORY_CARE_PROVIDER_SITE_OTHER): Payer: PRIVATE HEALTH INSURANCE | Admitting: Internal Medicine

## 2013-08-12 ENCOUNTER — Encounter: Payer: Self-pay | Admitting: Internal Medicine

## 2013-08-12 VITALS — BP 122/74 | HR 81 | Temp 97.9°F | Resp 16 | Ht 65.0 in | Wt 265.8 lb

## 2013-08-12 DIAGNOSIS — K5090 Crohn's disease, unspecified, without complications: Secondary | ICD-10-CM

## 2013-08-12 DIAGNOSIS — E119 Type 2 diabetes mellitus without complications: Secondary | ICD-10-CM

## 2013-08-12 DIAGNOSIS — J45909 Unspecified asthma, uncomplicated: Secondary | ICD-10-CM

## 2013-08-12 DIAGNOSIS — E559 Vitamin D deficiency, unspecified: Secondary | ICD-10-CM

## 2013-08-12 DIAGNOSIS — K219 Gastro-esophageal reflux disease without esophagitis: Secondary | ICD-10-CM

## 2013-08-12 DIAGNOSIS — E7800 Pure hypercholesterolemia, unspecified: Secondary | ICD-10-CM

## 2013-08-12 DIAGNOSIS — I10 Essential (primary) hypertension: Secondary | ICD-10-CM

## 2013-08-12 NOTE — Assessment & Plan Note (Addendum)
Sugars as outlined.  On Lantus along with her other oral medications.  Discussed the importance of diet, exercise and weight loss.  We discussed titrating up the lantus if sugars remain elevated above 200.  Follow metabolic panel and T4S.  Will refer for two hour refresher class.  May need to add medication if persistent elevation.

## 2013-08-12 NOTE — Progress Notes (Signed)
Pre visit review using our clinic review tool, if applicable. No additional management support is needed unless otherwise documented below in the visit note. 

## 2013-08-12 NOTE — Assessment & Plan Note (Signed)
Blood pressure doing well.  Same meds.  Follow metabolic panel.

## 2013-08-16 ENCOUNTER — Encounter: Payer: Self-pay | Admitting: Internal Medicine

## 2013-08-16 NOTE — Assessment & Plan Note (Signed)
Breathing stable.  Follow.

## 2013-08-16 NOTE — Progress Notes (Signed)
Subjective:    [name], female    DOB: [birth year], 58 y.o.   MRN: [id number]  HPI 58 year old female with past history of documented Crohn's disease, hypertension, hypercholesterolemia, diabetes and IBS who comes in today for a scheduled follow up.   She was recently seen by Raquel Rey for LLQ pain.  Was treated for diverticulitis with augmentin and flagyl.  After about three days of abx, the pain resolved.  No change in bowel movements.  No fever.  Has had no further abdominal pain like that.  She has been doing better watching her diet.  She had been having increased sugars recently despite multiple medications.  Have discussed diet and exercise and weight loss.  On Lantus now.  Sugars before breakfast - 160-190.  Pm sugars 200.   Breathing stable.  No nausea or vomiting.   Had a recent colonoscopy.  Revealed diverticulosis and internal hemorrhoids.  The abdominal pain and discomfort have essentially resolved.  Overall she feels she is doing relatively well.  She is walking.     Past Medical History  Diagnosis Date  . Hypertension   . Diabetes mellitus   . Hypercholesterolemia   . Crohn's disease   . IBS (irritable bowel syndrome)   . Depression   . Nephrolithiasis     s/p lithotripsy [facility])  . COPD (chronic obstructive pulmonary disease)     Current Outpatient Prescriptions on File Prior to Visit  Medication Sig Dispense Refill  . albuterol (PROVENTIL HFA;VENTOLIN HFA) 108 (90 BASE) MCG/ACT inhaler Inhale 2 puffs into the lungs every 6 (six) hours as needed.      Marland Kitchen albuterol (PROVENTIL) (2.5 MG/3ML) 0.083% nebulizer solution Take 3 mLs (2.5 mg total) by nebulization every 6 (six) hours as needed for wheezing.  150 mL  1  . amLODipine (NORVASC) 5 MG tablet Take 1 tablet (5 mg total) by mouth daily.  90 tablet  3  . B-D ULTRAFINE III SHORT PEN 31G X 8 MM MISC USE AS DIRECTED  100 each  3  . buPROPion (WELLBUTRIN XL) 150 MG 24 hr tablet Take 1 tablet (150  mg total) by mouth daily.  90 tablet  3  . esomeprazole (NEXIUM) 40 MG capsule Take 1 capsule (40 mg total) by mouth 2 (two) times daily.  180 capsule  3  . fluconazole (DIFLUCAN) 150 MG tablet Take one tablet x 1.  If persistent symptoms then repeat x 1 in three days.  2 tablet  0  . fluticasone (FLONASE) 50 MCG/ACT nasal spray Place 2 sprays into the nose daily.      . fluticasone-salmeterol (ADVAIR HFA) 230-21 MCG/ACT inhaler Inhale 2 puffs into the lungs 2 (two) times daily.      . furosemide (LASIX) 20 MG tablet Take 1 tablet (20 mg total) by mouth daily.  90 tablet  3  . glimepiride (AMARYL) 2 MG tablet TAKE 2 TABLETS EVERY MORNING AND ONE TABLET EVERY EVENING  270 tablet  4  . hyoscyamine (LEVSIN SL) 0.125 MG SL tablet Place 0.125 mg under the tongue every 6 (six) hours as needed.      . insulin glargine (LANTUS) 100 UNIT/ML injection Inject 14 Units into the skin daily.      Marland Kitchen LANTUS SOLOSTAR 100 UNIT/ML Solostar Pen INJECT 10 UNITS EVERY MORNING  15 mL  3  . losartan (COZAAR) 100 MG tablet Take 1 tablet (100 mg total) by mouth daily.  90 tablet  3  .  metFORMIN (GLUCOPHAGE) 500 MG tablet 2 tablets bid  360 tablet  3  . metoprolol succinate (TOPROL XL) 50 MG 24 hr tablet Take 1 tablet (50 mg total) by mouth daily. Take with or immediately following a meal.  90 tablet  3  . metroNIDAZOLE (FLAGYL) 500 MG tablet Take 1 tablet (500 mg total) by mouth 3 (three) times daily.  30 tablet  0  . montelukast (SINGULAIR) 10 MG tablet Take 1 tablet (10 mg total) by mouth daily.  90 tablet  3  . pioglitazone (ACTOS) 30 MG tablet Take 1 tablet (30 mg total) by mouth daily.  90 tablet  3  . pravastatin (PRAVACHOL) 10 MG tablet Take 1 tablet (10 mg total) by mouth daily.  30 tablet  2  . Vitamin D, Ergocalciferol, (DRISDOL) 50000 UNITS CAPS capsule Take 1 capsule (50,000 Units total) by mouth every 7 (seven) days.  8 capsule  0   No current facility-administered medications on file prior to visit.     Review of Systems Patient denies any headache, lightheadedness or dizziness.  No significant sinus or allergy symptoms.  No chest pain, tightness or palpitations.  No increased shortness of breath, cough or congestion.  Breathing stable.  No vomiting.  No constipation, BRBPR or melana.  On Carafate.  Abdominal pain improved/resolved.   Bowels better.  Sugars as outlined.       Objective:   Physical Exam  Filed Vitals:   08/12/13 1223  BP: 122/74  Pulse: 81  Temp: 97.9 F (36.6 C)  Resp: 44   58 year old female in no acute distress.   HEENT:  Nares- clear.  Oropharynx - without lesions. NECK:  Supple.  Nontender.  No audible bruit.  HEART:  Appears to be regular. LUNGS:  No crackles or wheezing audible.  Respirations even and unlabored.  RADIAL PULSE:  Equal bilaterally.  ABDOMEN:  Soft, nontender.  Bowel sounds present and normal.  No audible abdominal bruit.    EXTREMITIES:  No increased edema present.  DP pulses palpable and equal bilaterally.  FEET:  No lesions.              Assessment & Plan:  INCREASED PSYCHOSOCIAL STRESSORS.  On Wellbutrin.  Stable.    CARDIOVASCULAR.  Asymptomatic.  Continue risk factor modification.   ABDOMINAL PAIN.  Had a colonoscopy and EGD.  Was placed on carafate.  Recently treated for diverticulitis.   No significant abdominal pain now.  Bowels stable.  Follow.    HEALTH MAINTENANCE.  Physical 06/04/12.  GI as outlined.  Mammogram per documentation 06/30/13 - BiRADS II.  Has seen Dr Bary Castilla.  Overdue a physical.  Schedule.

## 2013-08-16 NOTE — Assessment & Plan Note (Signed)
Low cholesterol diet and exercise.  Declines medication.  Follow lipid panel.

## 2013-08-16 NOTE — Assessment & Plan Note (Signed)
Recently had colonoscopy.  Revealed a 60m polyp, diverticulosis and internal hemorrhoids.  Abdominal pain resolved.  Follow.

## 2013-08-16 NOTE — Assessment & Plan Note (Signed)
Continue supplements.  Follow vitamin D level.

## 2013-08-16 NOTE — Assessment & Plan Note (Signed)
EGD 06/27/07 revealed a single gastric polyp with some gastric mucosal erythema.  Just had follow up EGD.  Placed on carafate.  Upper symptoms appear to be controlled.  Follow.

## 2013-08-21 ENCOUNTER — Other Ambulatory Visit: Payer: Self-pay | Admitting: Internal Medicine

## 2013-08-25 ENCOUNTER — Telehealth: Payer: Self-pay | Admitting: Internal Medicine

## 2013-08-25 NOTE — Telephone Encounter (Signed)
Noted.  Let me know if symptoms do not resolve.

## 2013-08-25 NOTE — Telephone Encounter (Signed)
The patient discontinued her pravastatin (PRAVACHOL) 10 MG tablet. Making her muscles weak.

## 2013-08-25 NOTE — Telephone Encounter (Signed)
FYI: Removed medication from med list

## 2013-08-25 NOTE — Telephone Encounter (Signed)
Pt stated that she still hasn't received an appointment for endocrinology @ [facility] or a referral for diabetic education. She states that she has been waiting over a month now.

## 2013-08-25 NOTE — Telephone Encounter (Signed)
This is the patient that I told you about.  The referral states auth.  Please let pt know what is going on.  Thanks.

## 2013-08-25 NOTE — Telephone Encounter (Signed)
Pt.notified

## 2013-08-26 NOTE — Telephone Encounter (Signed)
[facility] notified pt that they will be contacting her about her appt. The initial appt request was sent to [facility] in April.

## 2013-10-01 ENCOUNTER — Ambulatory Visit (INDEPENDENT_AMBULATORY_CARE_PROVIDER_SITE_OTHER): Payer: PRIVATE HEALTH INSURANCE | Admitting: Internal Medicine

## 2013-10-01 ENCOUNTER — Encounter: Payer: Self-pay | Admitting: Internal Medicine

## 2013-10-01 ENCOUNTER — Other Ambulatory Visit (HOSPITAL_COMMUNITY)
Admission: RE | Admit: 2013-10-01 | Discharge: 2013-10-01 | Disposition: A | Discharge status: Home / Self Care | Payer: PRIVATE HEALTH INSURANCE | Source: Ambulatory Visit | Attending: Internal Medicine | Admitting: Internal Medicine

## 2013-10-01 VITALS — BP 124/64 | HR 80 | Temp 97.8°F | Ht 65.0 in | Wt 264.5 lb

## 2013-10-01 DIAGNOSIS — Z1151 Encounter for screening for human papillomavirus (HPV): Secondary | ICD-10-CM | POA: Insufficient documentation

## 2013-10-01 DIAGNOSIS — E559 Vitamin D deficiency, unspecified: Secondary | ICD-10-CM

## 2013-10-01 DIAGNOSIS — J45909 Unspecified asthma, uncomplicated: Secondary | ICD-10-CM

## 2013-10-01 DIAGNOSIS — K219 Gastro-esophageal reflux disease without esophagitis: Secondary | ICD-10-CM

## 2013-10-01 DIAGNOSIS — E7800 Pure hypercholesterolemia, unspecified: Secondary | ICD-10-CM

## 2013-10-01 DIAGNOSIS — E119 Type 2 diabetes mellitus without complications: Secondary | ICD-10-CM

## 2013-10-01 DIAGNOSIS — J4520 Mild intermittent asthma, uncomplicated: Secondary | ICD-10-CM

## 2013-10-01 DIAGNOSIS — Z01419 Encounter for gynecological examination (general) (routine) without abnormal findings: Secondary | ICD-10-CM | POA: Insufficient documentation

## 2013-10-01 DIAGNOSIS — K5090 Crohn's disease, unspecified, without complications: Secondary | ICD-10-CM

## 2013-10-01 DIAGNOSIS — I10 Essential (primary) hypertension: Secondary | ICD-10-CM

## 2013-10-01 DIAGNOSIS — Z124 Encounter for screening for malignant neoplasm of cervix: Secondary | ICD-10-CM

## 2013-10-01 MED ORDER — LOSARTAN POTASSIUM 100 MG PO TABS: 100.0000 mg | ORAL_TABLET | Freq: Every day | ORAL | Status: DC

## 2013-10-01 MED ORDER — ALBUTEROL SULFATE HFA 108 (90 BASE) MCG/ACT IN AERS: 2.0000 | INHALATION_SPRAY | Freq: Four times a day (QID) | RESPIRATORY_TRACT | Status: DC | PRN

## 2013-10-01 MED ORDER — METOPROLOL SUCCINATE ER 50 MG PO TB24
50.0000 mg | ORAL_TABLET | Freq: Every day | ORAL | Status: DC
Start: 2013-10-01 — End: 2014-01-06

## 2013-10-01 MED ORDER — AMLODIPINE BESYLATE 5 MG PO TABS: 5.0000 mg | ORAL_TABLET | Freq: Every day | ORAL | Status: DC

## 2013-10-01 MED ORDER — FLUTICASONE-SALMETEROL 230-21 MCG/ACT IN AERO: 2.0000 | INHALATION_SPRAY | Freq: Two times a day (BID) | RESPIRATORY_TRACT | Status: DC

## 2013-10-01 MED ORDER — BUPROPION HCL ER (XL) 150 MG PO TB24: 150.0000 mg | ORAL_TABLET | Freq: Every day | ORAL | Status: DC

## 2013-10-01 NOTE — Progress Notes (Signed)
Pre visit review using our clinic review tool, if applicable. No additional management support is needed unless otherwise documented below in the visit note. 

## 2013-10-02 LAB — CYTOLOGY - PAP

## 2013-10-04 ENCOUNTER — Encounter: Payer: Self-pay | Admitting: Internal Medicine

## 2013-10-04 NOTE — Assessment & Plan Note (Signed)
Recently had colonoscopy.  Revealed a 62m polyp, diverticulosis and internal hemorrhoids.  Abdominal pain resolved.  Follow.

## 2013-10-04 NOTE — Assessment & Plan Note (Signed)
Breathing stable.  Follow.

## 2013-10-04 NOTE — Progress Notes (Signed)
Subjective:    [name], female    DOB: 1955/10/11, 58 y.o.   MRN: [id number]  HPI 58 year old female with past history of documented Crohn's disease, hypertension, hypercholesterolemia, diabetes and IBS who comes in today to follow up on these issues as well as for a complete physical exam.   She was recently seen by Raquel Rey for LLQ pain.  Was treated for diverticulitis with augmentin and flagyl.  After about three days of abx, the pain resolved.  No change in bowel movements.  No fever.  Has had no further abdominal pain like that.  States overall her bowels have been doing better.  Abdomen stable.  She has been doing better watching her diet.  She has been having increased sugars recently despite multiple medications.  Have discussed diet and exercise and weight loss.  On Lantus now.  Sugars before breakfast she reports are in the 130s.  PM sugars vary.  She did not bring in any sugar readings.  Breathing stable.  No nausea or vomiting.   Had a recent colonoscopy.  Revealed diverticulosis and internal hemorrhoids.  Overall she feels she is doing relatively well.  She is walking.  Increased stress.  Her mother's in the hospital.     Past Medical History  Diagnosis Date  . Hypertension   . Diabetes mellitus   . Hypercholesterolemia   . Crohn's disease   . IBS (irritable bowel syndrome)   . Depression   . Nephrolithiasis     s/p lithotripsy [facility])  . COPD (chronic obstructive pulmonary disease)     Current Outpatient Prescriptions on File Prior to Visit  Medication Sig Dispense Refill  . albuterol (PROVENTIL) (2.5 MG/3ML) 0.083% nebulizer solution Take 3 mLs (2.5 mg total) by nebulization every 6 (six) hours as needed for wheezing.  150 mL  1  . B-D ULTRAFINE III SHORT PEN 31G X 8 MM MISC USE AS DIRECTED  100 each  3  . esomeprazole (NEXIUM) 40 MG capsule Take 1 capsule (40 mg total) by mouth 2 (two) times daily.  180 capsule  3  . fluticasone  (FLONASE) 50 MCG/ACT nasal spray Place 2 sprays into the nose daily.      . furosemide (LASIX) 20 MG tablet Take 1 tablet (20 mg total) by mouth daily.  90 tablet  3  . glimepiride (AMARYL) 2 MG tablet TAKE 2 TABLETS EVERY MORNING AND ONE TABLET EVERY EVENING  270 tablet  4  . hyoscyamine (LEVSIN SL) 0.125 MG SL tablet Place 0.125 mg under the tongue every 6 (six) hours as needed.      . insulin glargine (LANTUS) 100 UNIT/ML injection Inject 14 Units into the skin daily.      Marland Kitchen LANTUS SOLOSTAR 100 UNIT/ML Solostar Pen INJECT 10 UNITS EVERY MORNING  15 mL  3  . metFORMIN (GLUCOPHAGE) 500 MG tablet 2 tablets bid  360 tablet  3  . montelukast (SINGULAIR) 10 MG tablet Take 1 tablet (10 mg total) by mouth daily.  90 tablet  3  . pioglitazone (ACTOS) 30 MG tablet Take 1 tablet (30 mg total) by mouth daily.  90 tablet  3  . Vitamin D, Ergocalciferol, (DRISDOL) 50000 UNITS CAPS capsule TAKE 1 CAPSULE (50,000 UNITS TOTAL) BY MOUTH EVERY 7 (SEVEN) DAYS.  8 capsule  2   No current facility-administered medications on file prior to visit.    Review of Systems Patient denies any headache, lightheadedness or dizziness.  No significant  sinus or allergy symptoms.  No chest pain, tightness or palpitations.  No increased shortness of breath, cough or congestion.  Breathing stable.  No vomiting. No acid reflux.   No constipation, BRBPR or melana.  On Carafate.  Abdominal pain improved/resolved.   Bowels better.  Sugars as outlined.       Objective:   Physical Exam  Filed Vitals:   10/01/13 0828  BP: 124/64  Pulse: 80  Temp: 97.8 F (36.6 C)   Blood pressure recheck:  126/78, pulse 6  58 year old female in no acute distress.   HEENT:  Nares- clear.  Oropharynx - without lesions. NECK:  Supple.  Nontender.  No audible bruit.  HEART:  Appears to be regular. LUNGS:  No crackles or wheezing audible.  Respirations even and unlabored.  RADIAL PULSE:  Equal bilaterally.    BREASTS:  No nipple discharge or  nipple retraction present.  Could not appreciate any distinct nodules or axillary adenopathy.  ABDOMEN:  Soft, nontender.  Bowel sounds present and normal.  No audible abdominal bruit.  GU:  Normal external genitalia.  Vaginal vault without lesions.  Cervix identified.  Pap performed. Could not appreciate any adnexal masses or tenderness.   RECTAL:  Heme negative.   EXTREMITIES:  No increased edema present.  DP pulses palpable and equal bilaterally.      FEET:  No lesions.        Assessment & Plan:  INCREASED PSYCHOSOCIAL STRESSORS.  On Wellbutrin.  Stable.    CARDIOVASCULAR.  Asymptomatic.  Continue risk factor modification.   ABDOMINAL PAIN.  Had a colonoscopy and EGD.  Was placed on carafate.  Recently treated for diverticulitis.   No significant abdominal pain now.  Bowels stable.  Follow.    HEALTH MAINTENANCE.  Physical today. PAP today.   GI as outlined.  Mammogram per documentation 06/30/13 - BiRADS II.  Has seen Dr Bary Castilla.     I spent 25 minutes with the patient and more than 50% of the time was spent in consultation regarding the above.

## 2013-10-04 NOTE — Assessment & Plan Note (Signed)
Low cholesterol diet and exercise.  Declines medication.  Follow lipid panel.

## 2013-10-04 NOTE — Assessment & Plan Note (Signed)
EGD 06/27/07 revealed a single gastric polyp with some gastric mucosal erythema.  Just had follow up EGD.  Placed on carafate.  Upper symptoms appear to be controlled.  Follow.

## 2013-10-04 NOTE — Assessment & Plan Note (Signed)
Blood pressure doing well.  Same meds.  Follow metabolic panel.

## 2013-10-04 NOTE — Assessment & Plan Note (Signed)
Continue supplements.  Follow vitamin D level.

## 2013-10-04 NOTE — Assessment & Plan Note (Signed)
Sugars as outlined.  On Lantus along with her other oral medications.  Discussed the importance of diet, exercise and weight loss.  We discussed titrating up the lantus if sugars remain elevated above 200.  Follow metabolic panel and S2A.  Will refer to endocrinology for further evaluation and treatment recommendations.

## 2013-10-05 ENCOUNTER — Encounter: Payer: Self-pay | Admitting: *Deleted

## 2013-10-14 ENCOUNTER — Telehealth: Payer: Self-pay | Admitting: Internal Medicine

## 2013-10-14 ENCOUNTER — Other Ambulatory Visit: Payer: Self-pay | Admitting: *Deleted

## 2013-10-14 MED ORDER — IPRATROPIUM-ALBUTEROL 0.5-2.5 (3) MG/3ML IN SOLN: 3.0000 mL | Freq: Three times a day (TID) | RESPIRATORY_TRACT | Status: DC | PRN

## 2013-10-14 NOTE — Telephone Encounter (Signed)
Pt left vm.  States she needs refill on albuterol sulfate inhalation solution.  She has 1 vial left and will not have her afternoon or evening dose. Thought she had refills but does not.  Ipratropium bromide and albuterol sulfate inhalation solution 0.05-3 (2.5 mg).  Has been using since January, given it in the ER and has been using since.  Asking for refill.

## 2013-10-14 NOTE — Telephone Encounter (Signed)
This has been addressed-refill sent to pharmacy & pt notified. She also left a message on my voicemail

## 2013-11-09 ENCOUNTER — Other Ambulatory Visit: Payer: Self-pay | Admitting: Internal Medicine

## 2013-11-26 ENCOUNTER — Telehealth: Payer: Self-pay | Admitting: Internal Medicine

## 2013-11-26 NOTE — Telephone Encounter (Signed)
Pt called in and stated she was seen at the clinic for a collapsed lung and pneumonia and was needing to get a follow up appt and stated her medication will be running out soon and would like to have an appt by wed or thurs

## 2013-11-26 NOTE — Telephone Encounter (Signed)
Went to [facility] walk in clinic last week (treated) & again today (sx's worse). Changed abx & put back on Prednisone. Pt states that she is in a lot of pain & was told it is related to the collapsed lung. She needs a follow-up next Wed or Thursday. I will request records on 8/21 (to give them time to complete them from today)

## 2013-11-26 NOTE — Telephone Encounter (Signed)
I can see her at 1:00 on Wednesday 12/02/13.  If collapsed lung, etc - probably need to see pulmonary.

## 2013-11-27 NOTE — Telephone Encounter (Signed)
Open appt at 10:00, pt agreeable to that appointment time.

## 2013-11-27 NOTE — Telephone Encounter (Signed)
Pt notified of appt time, verbalized understanding

## 2013-12-02 ENCOUNTER — Ambulatory Visit (INDEPENDENT_AMBULATORY_CARE_PROVIDER_SITE_OTHER): Payer: PRIVATE HEALTH INSURANCE | Admitting: Internal Medicine

## 2013-12-02 ENCOUNTER — Encounter: Payer: Self-pay | Admitting: Internal Medicine

## 2013-12-02 VITALS — BP 124/62 | HR 80 | Temp 98.1°F | Ht 65.0 in | Wt 259.8 lb

## 2013-12-02 DIAGNOSIS — E559 Vitamin D deficiency, unspecified: Secondary | ICD-10-CM

## 2013-12-02 DIAGNOSIS — K219 Gastro-esophageal reflux disease without esophagitis: Secondary | ICD-10-CM

## 2013-12-02 DIAGNOSIS — I10 Essential (primary) hypertension: Secondary | ICD-10-CM

## 2013-12-02 DIAGNOSIS — E7800 Pure hypercholesterolemia, unspecified: Secondary | ICD-10-CM

## 2013-12-02 DIAGNOSIS — J45909 Unspecified asthma, uncomplicated: Secondary | ICD-10-CM

## 2013-12-02 DIAGNOSIS — K5090 Crohn's disease, unspecified, without complications: Secondary | ICD-10-CM

## 2013-12-02 DIAGNOSIS — E119 Type 2 diabetes mellitus without complications: Secondary | ICD-10-CM

## 2013-12-02 DIAGNOSIS — J4520 Mild intermittent asthma, uncomplicated: Secondary | ICD-10-CM

## 2013-12-02 NOTE — Progress Notes (Signed)
Pre visit review using our clinic review tool, if applicable. No additional management support is needed unless otherwise documented below in the visit note. 

## 2013-12-06 ENCOUNTER — Encounter: Payer: Self-pay | Admitting: Internal Medicine

## 2013-12-06 MED ORDER — PREDNISONE 10 MG PO TABS: ORAL_TABLET | ORAL | Status: DC

## 2013-12-06 NOTE — Assessment & Plan Note (Signed)
EGD 06/27/07 revealed a single gastric polyp with some gastric mucosal erythema.  Just had follow up EGD.  Placed on carafate.  Upper symptoms appear to be controlled.  Follow.

## 2013-12-06 NOTE — Assessment & Plan Note (Signed)
Recently had colonoscopy.  Revealed a 88m polyp, diverticulosis and internal hemorrhoids.  Abdominal pain resolved.  Follow.

## 2013-12-06 NOTE — Assessment & Plan Note (Signed)
Breathing doing better.  On prednisone.  Discussed prednisone taper.  Will start at 65m and decrease by 561meach day until off.  Recently found to have pneumonia.  Will need f/u cxr.  [facility]  Will need repeat cxr in 3-4 weeks.

## 2013-12-06 NOTE — Assessment & Plan Note (Signed)
Blood pressure doing well.  Same meds.  Follow metabolic panel.

## 2013-12-06 NOTE — Progress Notes (Signed)
Subjective:    [name], female    DOB: 1956/03/18, 58 y.o.   MRN: [id number]  HPI 58 year old female with past history of documented Crohn's disease, hypertension, hypercholesterolemia, diabetes and IBS who comes in today for a scheduled follow up.  Recently evaluated for increased cough and sob.  Was treated with prednisone and abx.  Still persistent symptoms and was reevaluated in acute care last week.  Given a zpak and more prednisone.  Breathing better now.  Feels better.  Discussed continued prednisone taper.   No change in bowel movements.  No fever.  States overall her bowels have been doing better.  No abdominal pain or cramping.  She has been doing better watching her diet.  She had been having increased sugars recently despite multiple medications.  Now seeing Dr Eddie Dibbles.  She has adjusted her medications.  She is now on farxiga and tanzeum.  Off lasix now.   Decreasing her lantus. Sugars doing better - 120-140.  She did not bring in any sugar readings.   No nausea or vomiting.   Had a recent colonoscopy.  Revealed diverticulosis and internal hemorrhoids.  Overall she feels she is doing relatively well.  She is walking.  Increased stress with her mother's medical issues.  She feels she is handling things relatively well.      Past Medical History  Diagnosis Date  . Hypertension   . Diabetes mellitus   . Hypercholesterolemia   . Crohn's disease   . IBS (irritable bowel syndrome)   . Depression   . Nephrolithiasis     s/p lithotripsy [facility])  . COPD (chronic obstructive pulmonary disease)     Current Outpatient Prescriptions on File Prior to Visit  Medication Sig Dispense Refill  . albuterol (PROVENTIL HFA;VENTOLIN HFA) 108 (90 BASE) MCG/ACT inhaler Inhale 2 puffs into the lungs every 6 (six) hours as needed.  54 g  3  . albuterol (PROVENTIL) (2.5 MG/3ML) 0.083% nebulizer solution Take 3 mLs (2.5 mg total) by nebulization every 6 (six) hours as needed  for wheezing.  150 mL  1  . amLODipine (NORVASC) 5 MG tablet Take 1 tablet (5 mg total) by mouth daily.  90 tablet  3  . B-D ULTRAFINE III SHORT PEN 31G X 8 MM MISC USE AS DIRECTED  100 each  3  . buPROPion (WELLBUTRIN XL) 150 MG 24 hr tablet Take 1 tablet (150 mg total) by mouth daily.  90 tablet  3  . esomeprazole (NEXIUM) 40 MG capsule Take 1 capsule (40 mg total) by mouth 2 (two) times daily.  180 capsule  3  . fluticasone (FLONASE) 50 MCG/ACT nasal spray Place 2 sprays into the nose daily.      . fluticasone-salmeterol (ADVAIR HFA) 230-21 MCG/ACT inhaler Inhale 2 puffs into the lungs 2 (two) times daily.  3 Inhaler  3  . furosemide (LASIX) 20 MG tablet Take 1 tablet (20 mg total) by mouth daily.  90 tablet  3  . glimepiride (AMARYL) 2 MG tablet TAKE 2 TABLETS EVERY MORNING AND ONE TABLET EVERY EVENING  270 tablet  4  . hyoscyamine (LEVSIN SL) 0.125 MG SL tablet Place 0.125 mg under the tongue every 6 (six) hours as needed.      . insulin glargine (LANTUS) 100 UNIT/ML injection Inject 14 Units into the skin daily.      Marland Kitchen ipratropium-albuterol (DUONEB) 0.5-2.5 (3) MG/3ML SOLN Take 3 mLs by nebulization every 8 (eight) hours as needed.  360 mL  1  . LANTUS SOLOSTAR 100 UNIT/ML Solostar Pen INJECT 10 UNITS EVERY MORNING  15 mL  3  . losartan (COZAAR) 100 MG tablet Take 1 tablet (100 mg total) by mouth daily.  90 tablet  3  . metFORMIN (GLUCOPHAGE) 500 MG tablet 2 tablets bid  360 tablet  3  . metoprolol succinate (TOPROL XL) 50 MG 24 hr tablet Take 1 tablet (50 mg total) by mouth daily. Take with or immediately following a meal.  90 tablet  3  . montelukast (SINGULAIR) 10 MG tablet Take 1 tablet (10 mg total) by mouth daily.  90 tablet  3  . pioglitazone (ACTOS) 30 MG tablet Take 1 tablet (30 mg total) by mouth daily.  90 tablet  3  . Vitamin D, Ergocalciferol, (DRISDOL) 50000 UNITS CAPS capsule TAKE 1 CAPSULE (50,000 UNITS TOTAL) BY MOUTH EVERY 7 (SEVEN) DAYS.  8 capsule  2   No current  facility-administered medications on file prior to visit.    Review of Systems Patient denies any headache, lightheadedness or dizziness.  No significant sinus or allergy symptoms.  No chest pain, tightness or palpitations.  No increased shortness of breath, cough or congestion.  Breathing better.  On prednisone.  No vomiting. No acid reflux.   No constipation, BRBPR or melana.  On Carafate.  Abdominal pain improved/resolved.   Bowels better.  Sugars as outlined.  Better.  Seeing Dr Eddie Dibbles.  Increased stress with her mother's medical issues.       Objective:   Physical Exam  Filed Vitals:   12/02/13 0953  BP: 124/62  Pulse: 80  Temp: 98.1 F (36.7 C)   Blood pressure recheck:  45/81  58 year old female in no acute distress.   HEENT:  Nares- clear.  Oropharynx - without lesions. NECK:  Supple.  Nontender.  No audible bruit.  HEART:  Appears to be regular. LUNGS:  No crackles or wheezing audible.  Respirations even and unlabored.  RADIAL PULSE:  Equal bilaterally.   ABDOMEN:  Soft, nontender.  Bowel sounds present and normal.  No audible abdominal bruit.    EXTREMITIES:  No increased edema present.  DP pulses palpable and equal bilaterally.      FEET:  No lesions.        Assessment & Plan:  INCREASED PSYCHOSOCIAL STRESSORS.  On Wellbutrin.  Stable.    CARDIOVASCULAR.  Asymptomatic.  Continue risk factor modification.   ABDOMINAL PAIN.  Had a colonoscopy and EGD.  Was placed on carafate.  Recently treated for diverticulitis.   No abdominal pain now.  Bowels stable.  Follow.    HEALTH MAINTENANCE.  Physical 10/01/13. PAP 10/01/13 negative with negative HPV.   GI as outlined.  Mammogram per documentation 06/30/13 - BiRADS II.  Has seen Dr Bary Castilla.     I spent 25 minutes with the patient and more than 50% of the time was spent in consultation regarding the above.

## 2013-12-06 NOTE — Assessment & Plan Note (Signed)
Low cholesterol diet and exercise.  Declines medication.  Follow lipid panel.

## 2013-12-06 NOTE — Assessment & Plan Note (Signed)
Continue supplements.  Follow vitamin D level.

## 2013-12-06 NOTE — Assessment & Plan Note (Signed)
Sugars as outlined.  Seeing Dr Eddie Dibbles now.  Has adjusted medications as outlined.  Sugars better.  Decreasing Lantus.  On less amaryl.  Follow sugars.  Have discussed the importance of diet, exercise and weight loss.  Follow metabolic panel and O7M.

## 2014-01-04 ENCOUNTER — Other Ambulatory Visit: Payer: Self-pay | Admitting: Internal Medicine

## 2014-01-04 ENCOUNTER — Other Ambulatory Visit: Payer: PRIVATE HEALTH INSURANCE

## 2014-01-06 ENCOUNTER — Encounter: Payer: Self-pay | Admitting: Internal Medicine

## 2014-01-06 ENCOUNTER — Ambulatory Visit (INDEPENDENT_AMBULATORY_CARE_PROVIDER_SITE_OTHER): Payer: PRIVATE HEALTH INSURANCE | Admitting: Internal Medicine

## 2014-01-06 VITALS — BP 130/70 | HR 94 | Temp 97.9°F | Ht 65.0 in | Wt 257.8 lb

## 2014-01-06 DIAGNOSIS — E7800 Pure hypercholesterolemia, unspecified: Secondary | ICD-10-CM

## 2014-01-06 DIAGNOSIS — K219 Gastro-esophageal reflux disease without esophagitis: Secondary | ICD-10-CM

## 2014-01-06 DIAGNOSIS — K5090 Crohn's disease, unspecified, without complications: Secondary | ICD-10-CM

## 2014-01-06 DIAGNOSIS — J45909 Unspecified asthma, uncomplicated: Secondary | ICD-10-CM

## 2014-01-06 DIAGNOSIS — E119 Type 2 diabetes mellitus without complications: Secondary | ICD-10-CM

## 2014-01-06 DIAGNOSIS — I10 Essential (primary) hypertension: Secondary | ICD-10-CM

## 2014-01-06 DIAGNOSIS — J4520 Mild intermittent asthma, uncomplicated: Secondary | ICD-10-CM

## 2014-01-06 MED ORDER — METOPROLOL SUCCINATE ER 50 MG PO TB24: 50.0000 mg | ORAL_TABLET | Freq: Every day | ORAL | Status: DC

## 2014-01-06 MED ORDER — ESOMEPRAZOLE MAGNESIUM 40 MG PO CPDR: 40.0000 mg | DELAYED_RELEASE_CAPSULE | Freq: Two times a day (BID) | ORAL | Status: DC

## 2014-01-06 MED ORDER — LOSARTAN POTASSIUM 100 MG PO TABS: 100.0000 mg | ORAL_TABLET | Freq: Every day | ORAL | Status: DC

## 2014-01-06 MED ORDER — AMLODIPINE BESYLATE 5 MG PO TABS: 5.0000 mg | ORAL_TABLET | Freq: Every day | ORAL | Status: DC

## 2014-01-06 MED ORDER — BUPROPION HCL ER (XL) 150 MG PO TB24: 150.0000 mg | ORAL_TABLET | Freq: Every day | ORAL | Status: DC

## 2014-01-06 NOTE — Assessment & Plan Note (Signed)
Recently had colonoscopy.  Revealed a 40m polyp, diverticulosis and internal hemorrhoids.  Abdominal pain resolved.  Follow.

## 2014-01-06 NOTE — Assessment & Plan Note (Signed)
Low cholesterol diet and exercise.  Declines medication.  Follow lipid panel.

## 2014-01-06 NOTE — Progress Notes (Signed)
Subjective:    [name], female    DOB: 1956-01-17, 58 y.o.   MRN: [id number]  HPI 58 year old female with past history of documented Crohn's disease, hypertension, hypercholesterolemia, diabetes and IBS who comes in today for a scheduled follow up.  Breathing better now.  Feels better.  No change in bowel movements.  No fever.  States overall her bowels have been doing better.  No abdominal pain or cramping.  She has been doing better watching her diet.  She had been having increased sugars recently despite multiple medications.  Now seeing Dr Eddie Dibbles.  She has adjusted her medications.  She is now on farxiga and tanzeum.  Off amaryl and lantus.  Has decreased her actos.  Sugars doing better.   She did not bring in any sugar readings.   No nausea or vomiting.   Had a recent colonoscopy.  Revealed diverticulosis and internal hemorrhoids.  Overall she feels she is doing relatively well.   Increased stress with her mother's medical issues.  She feels she is handling things relatively well.      Past Medical History  Diagnosis Date  . Hypertension   . Diabetes mellitus   . Hypercholesterolemia   . Crohn's disease   . IBS (irritable bowel syndrome)   . Depression   . Nephrolithiasis     s/p lithotripsy [facility])  . COPD (chronic obstructive pulmonary disease)     Current Outpatient Prescriptions on File Prior to Visit  Medication Sig Dispense Refill  . Albiglutide (TANZEUM) 30 MG PEN Inject 50 mg into the skin. Take every Tuesday      . albuterol (PROVENTIL HFA;VENTOLIN HFA) 108 (90 BASE) MCG/ACT inhaler Inhale 2 puffs into the lungs every 6 (six) hours as needed.  54 g  3  . albuterol (PROVENTIL) (2.5 MG/3ML) 0.083% nebulizer solution Take 3 mLs (2.5 mg total) by nebulization every 6 (six) hours as needed for wheezing.  150 mL  1  . B-D ULTRAFINE III SHORT PEN 31G X 8 MM MISC USE AS DIRECTED  100 each  3  . Empagliflozin (JARDIANCE) 10 MG TABS Take by mouth  daily.      . fluticasone (FLONASE) 50 MCG/ACT nasal spray Place 2 sprays into the nose daily.      . fluticasone-salmeterol (ADVAIR HFA) 230-21 MCG/ACT inhaler Inhale 2 puffs into the lungs 2 (two) times daily.  3 Inhaler  3  . furosemide (LASIX) 20 MG tablet Take 1 tablet (20 mg total) by mouth daily.  90 tablet  3  . hyoscyamine (LEVSIN SL) 0.125 MG SL tablet Place 0.125 mg under the tongue every 6 (six) hours as needed.      Marland Kitchen ipratropium-albuterol (DUONEB) 0.5-2.5 (3) MG/3ML SOLN TAKE 3 MLS BY NEBULIZATION EVERY 8 (EIGHT) HOURS AS NEEDED.  360 mL  1  . metFORMIN (GLUCOPHAGE) 500 MG tablet 2 tablets bid  360 tablet  3  . montelukast (SINGULAIR) 10 MG tablet Take 1 tablet (10 mg total) by mouth daily.  90 tablet  3  . Vitamin D, Ergocalciferol, (DRISDOL) 50000 UNITS CAPS capsule TAKE 1 CAPSULE (50,000 UNITS TOTAL) BY MOUTH EVERY 7 (SEVEN) DAYS.  8 capsule  2   No current facility-administered medications on file prior to visit.    Review of Systems Patient denies any headache, lightheadedness or dizziness.  No significant sinus or allergy symptoms.  No chest pain, tightness or palpitations.  No increased shortness of breath, cough or congestion.  Breathing  better.   No vomiting. No acid reflux.   No constipation, BRBPR or melana.  On Carafate.  Abdominal pain improved/resolved.   Bowels better.  Sugars better.  She feels better.  Seeing Dr Eddie Dibbles.  Increased stress with her mother's medical issues.       Objective:   Physical Exam  Filed Vitals:   01/06/14 1055  BP: 130/70  Pulse: 94  Temp: 97.9 F (36.6 C)   Blood pressure recheck:  6/94  58 year old female in no acute distress.   HEENT:  Nares- clear.  Oropharynx - without lesions. NECK:  Supple.  Nontender.  No audible bruit.  HEART:  Appears to be regular. LUNGS:  No crackles or wheezing audible.  Respirations even and unlabored.  RADIAL PULSE:  Equal bilaterally.   ABDOMEN:  Soft, nontender.  Bowel sounds present and  normal.  No audible abdominal bruit.    EXTREMITIES:  No increased edema present.  DP pulses palpable and equal bilaterally.      FEET:  No lesions.        Assessment & Plan:  INCREASED PSYCHOSOCIAL STRESSORS.  On Wellbutrin.  Stable.    CARDIOVASCULAR.  Asymptomatic.  Continue risk factor modification.   ABDOMINAL PAIN.  Had a colonoscopy and EGD.  Was placed on carafate.  Recently treated for diverticulitis.   No abdominal pain now.  Bowels stable.  Follow.    HEALTH MAINTENANCE.  Physical 10/01/13. PAP 10/01/13 negative with negative HPV.   GI as outlined.  Mammogram per documentation 06/30/13 - BiRADS II.  Has seen Dr Bary Castilla.

## 2014-01-06 NOTE — Assessment & Plan Note (Signed)
Blood pressure doing well.  Same meds.  Follow metabolic panel.

## 2014-01-06 NOTE — Assessment & Plan Note (Signed)
Breathing stable.

## 2014-01-06 NOTE — Assessment & Plan Note (Signed)
EGD 06/27/07 revealed a single gastric polyp with some gastric mucosal erythema.  Just had follow up EGD.  Placed on carafate.  Upper symptoms appear to be controlled.  Follow.

## 2014-01-06 NOTE — Assessment & Plan Note (Signed)
Sugars better.  Seeing Dr Eddie Dibbles now.  Has adjusted medications as outlined.  Sugars better.  Off Lantus and off amaryl.  Follow sugars.  Have discussed the importance of diet, exercise and weight loss.  Follow metabolic panel and M5E.

## 2014-01-06 NOTE — Progress Notes (Signed)
Pre visit review using our clinic review tool, if applicable. No additional management support is needed unless otherwise documented below in the visit note. 

## 2014-03-08 ENCOUNTER — Other Ambulatory Visit: Payer: Self-pay | Admitting: Internal Medicine

## 2014-04-14 ENCOUNTER — Ambulatory Visit (INDEPENDENT_AMBULATORY_CARE_PROVIDER_SITE_OTHER): Payer: PRIVATE HEALTH INSURANCE | Admitting: Internal Medicine

## 2014-04-14 ENCOUNTER — Encounter: Payer: Self-pay | Admitting: Internal Medicine

## 2014-04-14 VITALS — BP 118/64 | HR 83 | Temp 97.7°F | Ht 65.0 in | Wt 237.5 lb

## 2014-04-14 DIAGNOSIS — Z6830 Body mass index (BMI) 30.0-30.9, adult: Secondary | ICD-10-CM

## 2014-04-14 DIAGNOSIS — J4520 Mild intermittent asthma, uncomplicated: Secondary | ICD-10-CM | POA: Diagnosis not present

## 2014-04-14 DIAGNOSIS — E669 Obesity, unspecified: Secondary | ICD-10-CM

## 2014-04-14 DIAGNOSIS — E7800 Pure hypercholesterolemia, unspecified: Secondary | ICD-10-CM

## 2014-04-14 DIAGNOSIS — E559 Vitamin D deficiency, unspecified: Secondary | ICD-10-CM

## 2014-04-14 DIAGNOSIS — K219 Gastro-esophageal reflux disease without esophagitis: Secondary | ICD-10-CM

## 2014-04-14 DIAGNOSIS — I10 Essential (primary) hypertension: Secondary | ICD-10-CM | POA: Diagnosis not present

## 2014-04-14 DIAGNOSIS — K5090 Crohn's disease, unspecified, without complications: Secondary | ICD-10-CM

## 2014-04-14 DIAGNOSIS — E119 Type 2 diabetes mellitus without complications: Secondary | ICD-10-CM

## 2014-04-14 NOTE — Progress Notes (Signed)
Subjective:    [name], female    DOB: 1955/04/14, 59 y.o.   MRN: [id number]  HPI 59 year old female with past history of documented Crohn's disease, hypertension, hypercholesterolemia, diabetes and IBS who comes in today for a scheduled follow up.  Breathing is doing well.  No recent flares.  Has not required prednisone.  Using her inhalers.  Feels better.  States overall her bowels have been doing better.  No abdominal pain or cramping.  She has been doing better watching her diet.  Now seeing Dr Eddie Dibbles.  Sugars much improved on her current medication regimen.  Has been able to decrease her actos to 13m q day.  She is now on farxiga and tanzeum.  Off amaryl and lantus.  Sugars doing better.   She did not bring in any sugar readings.   Due to see Dr PEddie Dibblesat the end of the month.  No nausea or vomiting.   Had a colonoscopy.  Revealed diverticulosis and internal hemorrhoids.  Overall she feels she is doing relatively well.   Increased stress with her mother's medical issues.  She feels she is handling things relatively well.      Past Medical History  Diagnosis Date  . Hypertension   . Diabetes mellitus   . Hypercholesterolemia   . Crohn's disease   . IBS (irritable bowel syndrome)   . Depression   . Nephrolithiasis     [facility]  . COPD (chronic obstructive pulmonary disease)     Current Outpatient Prescriptions on File Prior to Visit  Medication Sig Dispense Refill  . Albiglutide (TANZEUM) 30 MG PEN Inject 50 mg into the skin. Take every Tuesday    . albuterol (PROVENTIL HFA;VENTOLIN HFA) 108 (90 BASE) MCG/ACT inhaler Inhale 2 puffs into the lungs every 6 (six) hours as needed. 54 g 3  . amLODipine (NORVASC) 5 MG tablet Take 1 tablet (5 mg total) by mouth daily. 90 tablet 3  . buPROPion (WELLBUTRIN XL) 150 MG 24 hr tablet Take 1 tablet (150 mg total) by mouth daily. 90 tablet 3  . esomeprazole (NEXIUM) 40 MG capsule Take 1 capsule (40 mg  total) by mouth 2 (two) times daily. 180 capsule 3  . fluticasone (FLONASE) 50 MCG/ACT nasal spray Place 2 sprays into the nose daily.    . fluticasone-salmeterol (ADVAIR HFA) 230-21 MCG/ACT inhaler Inhale 2 puffs into the lungs 2 (two) times daily. 3 Inhaler 3  . furosemide (LASIX) 20 MG tablet Take 1 tablet (20 mg total) by mouth daily. 90 tablet 3  . hyoscyamine (LEVSIN SL) 0.125 MG SL tablet Place 0.125 mg under the tongue every 6 (six) hours as needed.    .Marland Kitchenipratropium-albuterol (DUONEB) 0.5-2.5 (3) MG/3ML SOLN TAKE 3 MLS BY NEBULIZATION EVERY 8 (EIGHT) HOURS AS NEEDED. 360 mL 1  . losartan (COZAAR) 100 MG tablet Take 1 tablet (100 mg total) by mouth daily. 90 tablet 3  . metFORMIN (GLUCOPHAGE) 500 MG tablet 2 tablets bid 360 tablet 3  . metoprolol succinate (TOPROL XL) 50 MG 24 hr tablet Take 1 tablet (50 mg total) by mouth daily. Take with or immediately following a meal. 90 tablet 3  . montelukast (SINGULAIR) 10 MG tablet Take 1 tablet (10 mg total) by mouth daily. 90 tablet 3  . pioglitazone (ACTOS) 30 MG tablet Take 15 mg by mouth daily.     No current facility-administered medications on file prior to visit.    Review of Systems  Patient denies any headache, lightheadedness or dizziness.  No significant sinus or allergy symptoms.  No chest pain, tightness or palpitations.  No increased shortness of breath, cough or congestion.  Breathing better.   No vomiting. No acid reflux.   No constipation, BRBPR or melana.  On Carafate.  Abdominal pain improved/resolved.   Bowels stable.  No pain or recent flares.  Sugars better.  She feels better.  Seeing Dr Eddie Dibbles.  Increased stress with her mother's medical issues.  Feels she is handling things relatively well.  Blood sugars averaging 130-160.       Objective:   Physical Exam  Filed Vitals:   04/14/14 1103  BP: 118/64  Pulse: 83  Temp: 97.7 F (36.5 C)   Blood pressure recheck:  70/71  59 year old female in no acute distress.   HEENT:   Nares- clear.  Oropharynx - without lesions. NECK:  Supple.  Nontender.  No audible bruit.  HEART:  Appears to be regular. LUNGS:  No crackles or wheezing audible.  Respirations even and unlabored.  RADIAL PULSE:  Equal bilaterally.   ABDOMEN:  Soft, nontender.  Bowel sounds present and normal.  No audible abdominal bruit.    EXTREMITIES:  No increased edema present.  DP pulses palpable and equal bilaterally.      FEET:  No lesions.        Assessment & Plan:  Obesity (BMI 30-39.9) Diet and exercise.    Essential hypertension Blood pressure doing well on current regimen.  Same medication regimen.  Follow pressures.  Follow met b.  Labs performed by Dr Eddie Dibbles.    Asthma, mild intermittent, uncomplicated Breathing doing well.  No recent flares.  Follow.    Crohn's disease, without complications Had colonoscopy recently.  No abdominal pain or bowel flares.  Follow.   Gastroesophageal reflux disease without esophagitis Controlled.    Type 2 diabetes mellitus without complication Following with Dr Eddie Dibbles.  Sugars better.  See above.  Has f/u at the end of the month.  She is following a1c,  Up to date with eye checks.    Vitamin D deficiency Follow vitamin D level.  Vitamin D supplements.    Hypercholesterolemia Low cholesterol diet and exercise.  Follow lipid panel.    INCREASED PSYCHOSOCIAL STRESSORS.  On Wellbutrin.  Stable.    CARDIOVASCULAR.  Asymptomatic.  Continue risk factor modification.   ABDOMINAL PAIN.  Had a colonoscopy and EGD.  Was placed on carafate.  Recently treated for diverticulitis.   No abdominal pain now and not recent flares.   Bowels stable.  Follow.    HEALTH MAINTENANCE.  Physical 10/01/13. PAP 10/01/13 negative with negative HPV.   GI as outlined.  Mammogram per documentation 06/30/13 - BiRADS II.  Has seen Dr Bary Castilla.

## 2014-04-14 NOTE — Progress Notes (Signed)
Pre visit review using our clinic review tool, if applicable. No additional management support is needed unless otherwise documented below in the visit note. 

## 2014-04-18 ENCOUNTER — Encounter: Payer: Self-pay | Admitting: Internal Medicine

## 2014-04-18 DIAGNOSIS — E669 Obesity, unspecified: Secondary | ICD-10-CM | POA: Insufficient documentation

## 2014-04-18 DIAGNOSIS — E1159 Type 2 diabetes mellitus with other circulatory complications: Secondary | ICD-10-CM

## 2014-04-18 DIAGNOSIS — E1169 Type 2 diabetes mellitus with other specified complication: Secondary | ICD-10-CM

## 2014-04-18 DIAGNOSIS — I10 Essential (primary) hypertension: Secondary | ICD-10-CM

## 2014-04-22 ENCOUNTER — Other Ambulatory Visit: Payer: Self-pay | Admitting: *Deleted

## 2014-04-22 MED ORDER — MONTELUKAST SODIUM 10 MG PO TABS: 10.0000 mg | ORAL_TABLET | Freq: Every day | ORAL | Status: DC

## 2014-04-22 NOTE — Telephone Encounter (Signed)
Singulair Rx printed & placed up front for pick up

## 2014-05-18 ENCOUNTER — Other Ambulatory Visit: Payer: Self-pay | Admitting: Internal Medicine

## 2014-05-19 ENCOUNTER — Other Ambulatory Visit: Payer: Self-pay | Admitting: Internal Medicine

## 2014-08-14 ENCOUNTER — Other Ambulatory Visit: Payer: Self-pay | Admitting: Internal Medicine

## 2014-08-17 ENCOUNTER — Telehealth: Payer: Self-pay | Admitting: *Deleted

## 2014-08-17 ENCOUNTER — Ambulatory Visit: Payer: PRIVATE HEALTH INSURANCE | Admitting: Internal Medicine

## 2014-08-17 NOTE — Telephone Encounter (Signed)
Spoke with patient & she states that she didn't even have this appt written down. Patient states that she will call us back to reschedule after seeing her endocrinologist soon.

## 2014-09-21 ENCOUNTER — Other Ambulatory Visit: Payer: Self-pay | Admitting: *Deleted

## 2014-09-21 MED ORDER — BUPROPION HCL ER (XL) 150 MG PO TB24: 150.0000 mg | ORAL_TABLET | Freq: Every day | ORAL | Status: DC

## 2014-09-21 MED ORDER — AMLODIPINE BESYLATE 5 MG PO TABS: 5.0000 mg | ORAL_TABLET | Freq: Every day | ORAL | Status: DC

## 2014-09-21 MED ORDER — LOSARTAN POTASSIUM 100 MG PO TABS: 100.0000 mg | ORAL_TABLET | Freq: Every day | ORAL | Status: DC

## 2014-09-21 MED ORDER — FUROSEMIDE 20 MG PO TABS: 20.0000 mg | ORAL_TABLET | Freq: Every day | ORAL | Status: DC

## 2014-09-21 MED ORDER — METFORMIN HCL 500 MG PO TABS: ORAL_TABLET | ORAL | Status: DC

## 2014-09-21 MED ORDER — METOPROLOL SUCCINATE ER 50 MG PO TB24: 50.0000 mg | ORAL_TABLET | Freq: Every day | ORAL | Status: DC

## 2014-09-21 MED ORDER — ESOMEPRAZOLE MAGNESIUM 40 MG PO CPDR: 40.0000 mg | DELAYED_RELEASE_CAPSULE | Freq: Two times a day (BID) | ORAL | Status: DC

## 2014-09-21 NOTE — Telephone Encounter (Signed)
Pt faxed Rx refill request. Needs 90 day refills of Wellbutrin XL, Nexium, Cozaar, Norvasc, Toprol XL, Glucophage, and Lasix. Needs written Rxs to take to [facility]. Last appt 04/14/14. Per Dr. Nicki Reaper, ok to refill 90 days, but needs to schedule 30 minute follow up and fasting lab appt. Pt states to call [phone] or leave a message at [phone] when Rxs ready.

## 2014-09-22 NOTE — Telephone Encounter (Signed)
Pt notified Rxs were ready, will mail per her request. Appt scheduled 12/10/14. Had labs drawn at Dr. Sammuel Hines office, has appt there tomorrow will get results and bring by the office to see if anything else needs to be checked,  verbalized understanding

## 2014-09-28 ENCOUNTER — Telehealth: Payer: Self-pay

## 2014-09-28 NOTE — Telephone Encounter (Signed)
The patient called and requested her jan appt notes be faxed to [place] at [phone] (for her insurance coverage).  She is disputing the obesity diagnosis.

## 2014-10-12 ENCOUNTER — Telehealth: Payer: Self-pay

## 2014-10-12 NOTE — Telephone Encounter (Signed)
CPE appointment made 12-10-14.  Last A1C 07-24-13. Result 8.8 (H) to be addressed at this time.

## 2014-12-02 ENCOUNTER — Encounter: Payer: Self-pay | Admitting: Family Medicine

## 2014-12-02 ENCOUNTER — Ambulatory Visit (INDEPENDENT_AMBULATORY_CARE_PROVIDER_SITE_OTHER): Payer: PRIVATE HEALTH INSURANCE | Admitting: Family Medicine

## 2014-12-02 DIAGNOSIS — N3001 Acute cystitis with hematuria: Secondary | ICD-10-CM

## 2014-12-02 LAB — POCT URINALYSIS DIPSTICK
Bilirubin, UA: NEGATIVE
Glucose, UA: >=1000
Ketones, UA: NEGATIVE
Nitrite, UA: POSITIVE
PH UA: 5.5
PROTEIN UA: 30
SPEC GRAV UA: 1.015
UROBILINOGEN UA: 0.2

## 2014-12-02 MED ORDER — PHENAZOPYRIDINE HCL 200 MG PO TABS: 200.0000 mg | ORAL_TABLET | Freq: Three times a day (TID) | ORAL | Status: DC | PRN

## 2014-12-02 MED ORDER — CEPHALEXIN 500 MG PO CAPS: 500.0000 mg | ORAL_CAPSULE | Freq: Two times a day (BID) | ORAL | Status: DC

## 2014-12-02 NOTE — Progress Notes (Signed)
   Subjective:  [name], female    DOB: 1956/03/26  Age: 59 y.o. MRN: [id number]  CC: Dysuria, Urinary frequency/urgency  HPI  59 year old female with a past medical history of DM 2 presents to the clinic today for an acute visit with complaints of dysuria, urinary urgency and frequency.  Patient reports that she's had the above symptoms for the past week. No exacerbating or relieving factors. She reports her dysuria is mild to moderate severity. No interventions tried. No reported fever. She does note chills and lower back pain. No recent vomiting. She states that she has nausea on a regular basis due to a side effect from 1 of her diabetes medications.  Social Hx - Former smoker.  Review of Systems  Constitutional: Positive for chills. Negative for fever.  Gastrointestinal: Positive for nausea. Negative for vomiting.  Musculoskeletal: Positive for back pain.   Objective:  BP 122/64 mmHg  Pulse 86  Temp(Src) 98.4 F (36.9 C) (Oral)  Ht 5' 5"  (1.651 m)  Wt 226 lb (102.513 kg)  BMI 37.61 kg/m2  SpO2 94%  BP/Weight 12/02/2014 04/14/2014 2/44/6286  Systolic BP 381 771 165  Diastolic BP 64 64 70  Wt. (Lbs) 226 237.5 257.75  BMI 37.61 39.52 42.89   Physical Exam  Constitutional: She is oriented to person, place, and time. She appears well-developed and well-nourished. No distress.  Morbidly obese.  Eyes: No scleral icterus.  Cardiovascular: Normal rate and regular rhythm.   No murmur heard. Pulmonary/Chest: Effort normal and breath sounds normal. No respiratory distress.  Abdominal: Soft. She exhibits no distension. There is no rebound and no guarding.  Tender to palpation in the suprapubic region.  Neurological: She is alert and oriented to person, place, and time.  Psychiatric: She has a normal mood and affect.  Vitals reviewed.   Assessment & Plan:   Problem List Items Addressed This Visit    UTI (urinary tract infection)    New problem. Urinalysis  consistent with UTI. Treating with Keflex 500 mg twice a day 7 days. Urine culture sent.      Relevant Medications   cephALEXin (KEFLEX) 500 MG capsule   phenazopyridine (PYRIDIUM) 200 MG tablet   Other Relevant Orders   POCT Urinalysis Dipstick (Completed)   Urine culture      Meds ordered this encounter  Medications  . cephALEXin (KEFLEX) 500 MG capsule    Sig: Take 1 capsule (500 mg total) by mouth 2 (two) times daily.    Dispense:  14 capsule    Refill:  0  . phenazopyridine (PYRIDIUM) 200 MG tablet    Sig: Take 1 tablet (200 mg total) by mouth 3 (three) times daily as needed for pain.    Dispense:  30 tablet    Refill:  0   Follow-up: PRN  Thersa Salt, DO

## 2014-12-02 NOTE — Progress Notes (Signed)
Pre visit review using our clinic review tool, if applicable. No additional management support is needed unless otherwise documented below in the visit note. 

## 2014-12-02 NOTE — Assessment & Plan Note (Signed)
New problem. Urinalysis consistent with UTI. Treating with Keflex 500 mg twice a day 7 days. Urine culture sent.

## 2014-12-02 NOTE — Patient Instructions (Signed)
It was nice to see you today.  Take the antibiotic and pyridium as prescribed.  Follow up as indicated.   Take care  Dr. Lacinda Axon

## 2014-12-05 LAB — URINE CULTURE: Colony Count: >=100000

## 2014-12-09 DIAGNOSIS — F419 Anxiety disorder, unspecified: Secondary | ICD-10-CM

## 2014-12-09 DIAGNOSIS — F32A Depression, unspecified: Secondary | ICD-10-CM | POA: Insufficient documentation

## 2014-12-09 DIAGNOSIS — T7840XA Allergy, unspecified, initial encounter: Secondary | ICD-10-CM | POA: Insufficient documentation

## 2014-12-09 DIAGNOSIS — L02429 Furuncle of limb, unspecified: Secondary | ICD-10-CM | POA: Insufficient documentation

## 2014-12-09 DIAGNOSIS — Z87442 Personal history of urinary calculi: Secondary | ICD-10-CM | POA: Insufficient documentation

## 2014-12-09 DIAGNOSIS — F329 Major depressive disorder, single episode, unspecified: Secondary | ICD-10-CM | POA: Insufficient documentation

## 2014-12-10 ENCOUNTER — Encounter: Payer: Self-pay | Admitting: Internal Medicine

## 2014-12-10 ENCOUNTER — Ambulatory Visit (INDEPENDENT_AMBULATORY_CARE_PROVIDER_SITE_OTHER): Payer: PRIVATE HEALTH INSURANCE | Admitting: Internal Medicine

## 2014-12-10 VITALS — BP 114/70 | HR 79 | Temp 97.5°F | Ht 65.0 in | Wt 227.5 lb

## 2014-12-10 DIAGNOSIS — R319 Hematuria, unspecified: Secondary | ICD-10-CM | POA: Diagnosis not present

## 2014-12-10 DIAGNOSIS — J4520 Mild intermittent asthma, uncomplicated: Secondary | ICD-10-CM

## 2014-12-10 DIAGNOSIS — K219 Gastro-esophageal reflux disease without esophagitis: Secondary | ICD-10-CM

## 2014-12-10 DIAGNOSIS — Z1239 Encounter for other screening for malignant neoplasm of breast: Secondary | ICD-10-CM | POA: Diagnosis not present

## 2014-12-10 DIAGNOSIS — E7800 Pure hypercholesterolemia, unspecified: Secondary | ICD-10-CM

## 2014-12-10 DIAGNOSIS — I10 Essential (primary) hypertension: Secondary | ICD-10-CM

## 2014-12-10 DIAGNOSIS — F32A Depression, unspecified: Secondary | ICD-10-CM

## 2014-12-10 DIAGNOSIS — N390 Urinary tract infection, site not specified: Secondary | ICD-10-CM

## 2014-12-10 DIAGNOSIS — E119 Type 2 diabetes mellitus without complications: Secondary | ICD-10-CM

## 2014-12-10 DIAGNOSIS — F419 Anxiety disorder, unspecified: Secondary | ICD-10-CM

## 2014-12-10 DIAGNOSIS — F329 Major depressive disorder, single episode, unspecified: Secondary | ICD-10-CM

## 2014-12-10 DIAGNOSIS — K5090 Crohn's disease, unspecified, without complications: Secondary | ICD-10-CM

## 2014-12-10 DIAGNOSIS — F418 Other specified anxiety disorders: Secondary | ICD-10-CM

## 2014-12-10 DIAGNOSIS — Z0000 Encounter for general adult medical examination without abnormal findings: Secondary | ICD-10-CM

## 2014-12-10 LAB — POCT URINALYSIS DIPSTICK
Bilirubin, UA: NEGATIVE
GLUCOSE UA: 500
Ketones, UA: NEGATIVE
Nitrite, UA: NEGATIVE
PROTEIN UA: NEGATIVE
Spec Grav, UA: 1.01
UROBILINOGEN UA: 0.2
pH, UA: 6

## 2014-12-10 MED ORDER — FLUCONAZOLE 150 MG PO TABS: ORAL_TABLET | ORAL | Status: DC

## 2014-12-10 MED ORDER — AMOXICILLIN 875 MG PO TABS: 875.0000 mg | ORAL_TABLET | Freq: Two times a day (BID) | ORAL | Status: DC

## 2014-12-10 NOTE — Progress Notes (Signed)
[name], female   DOB: [birth year], 59 y.o.   MRN: [id number]   Subjective:    [name], female    DOB: 1956-03-15, 59 y.o.   MRN: [id number]  HPI  Patient here to follow up on her current medical issues as well as for a complete physical exam.  She was seen recently for uti.  Treated with abx.  Still with persistent symptoms.  Is better, but still with increased frequency and urgency.  Pain has resolved.  Having some vaginal irritation. C/w yeast.  Using monistat.  States sugars have been doing well.  AM sugars averaging 105-135.  Evening sugars - same.  Is exercising.  Exercising in her pool.  No cardiac symptoms with increased activity or exertion.  Breathing doing well.  No abdominal pain or cramping.  No bowel change.  Handling stress.  Does not feel she needs any further intervention.     Past Medical History  Diagnosis Date  . Hypertension   . Diabetes mellitus   . Hypercholesterolemia   . Crohn's disease   . IBS (irritable bowel syndrome)   . Depression   . Nephrolithiasis     s/p lithotripsy [facility])  . COPD (chronic obstructive pulmonary disease)    Past Surgical History  Procedure Laterality Date  . Tonsillectomy  1966  . Laparoscopic supracervical hysterectomy  1985    left ovary not removed  . Lithotripsy      x7  . Laparoscopic cholecystectomy  2005    Dr Tamala Julian  . Polypectomy    . Shoulder surgery      right (pinning)  . Shoulder surgery      left (pinning)  . Breast biopsy  2013  . Abdominal hysterectomy  1985  . Appendectomy  1985   Family History  Problem Relation Age of Onset  . Heart disease Father   . COPD Mother   . Hypercholesterolemia Mother   . Emphysema Mother   . Hypertension Brother   . Emphysema      grandmother  . Breast cancer Neg Hx   . Colon cancer Neg Hx   . Emphysema Maternal Grandmother    Social History   Social History  . Marital Status: Married    Spouse Name: N/A  .  Number of Children: 2  . Years of Education: N/A   Occupational History  .  [employer]   Social History Main Topics  . Smoking status: Former Smoker    Quit date: 04/09/1994  . Smokeless tobacco: Never Used     Comment: QUIT IN 1990'S  . Alcohol Use: 0.0 oz/week    0 Standard drinks or equivalent per week     Comment: OCCASIONALLY  . Drug Use: No  . Sexual Activity: Not Asked   Other Topics Concern  . None   Social History Narrative    Outpatient Encounter Prescriptions as of 12/10/2014  Medication Sig  . Albiglutide (TANZEUM) 30 MG PEN Inject 50 mg into the skin. Take every Tuesday  . albuterol (PROVENTIL HFA;VENTOLIN HFA) 108 (90 BASE) MCG/ACT inhaler Inhale 2 puffs into the lungs every 6 (six) hours as needed.  Marland Kitchen amLODipine (NORVASC) 5 MG tablet Take 1 tablet (5 mg total) by mouth daily.  Marland Kitchen buPROPion (WELLBUTRIN XL) 150 MG 24 hr tablet Take 1 tablet (150 mg total) by mouth daily.  . empagliflozin (JARDIANCE) 25 MG TABS tablet Take 25 mg by mouth daily.  Marland Kitchen esomeprazole (NEXIUM) 40 MG  capsule Take 1 capsule (40 mg total) by mouth 2 (two) times daily.  . fluticasone (FLONASE) 50 MCG/ACT nasal spray Place 2 sprays into the nose daily.  . fluticasone-salmeterol (ADVAIR HFA) 230-21 MCG/ACT inhaler Inhale 2 puffs into the lungs 2 (two) times daily.  . furosemide (LASIX) 20 MG tablet Take 1 tablet (20 mg total) by mouth daily.  . hyoscyamine (LEVSIN SL) 0.125 MG SL tablet Place 0.125 mg under the tongue every 6 (six) hours as needed.  Marland Kitchen ipratropium-albuterol (DUONEB) 0.5-2.5 (3) MG/3ML SOLN TAKE 3 MLS BY NEBULIZATION EVERY 8 (EIGHT) HOURS AS NEEDED.  Marland Kitchen ipratropium-albuterol (DUONEB) 0.5-2.5 (3) MG/3ML SOLN TAKE 3 MLS BY NEBULIZATION EVERY 8 (EIGHT) HOURS AS NEEDED.  Marland Kitchen losartan (COZAAR) 100 MG tablet Take 1 tablet (100 mg total) by mouth daily.  . metFORMIN (GLUCOPHAGE) 500 MG tablet 2 tablets bid  . metoprolol succinate (TOPROL XL) 50 MG 24 hr tablet Take 1 tablet (50 mg total)  by mouth daily. Take with or immediately following a meal.  . montelukast (SINGULAIR) 10 MG tablet Take 1 tablet (10 mg total) by mouth daily.  . phenazopyridine (PYRIDIUM) 200 MG tablet Take 1 tablet (200 mg total) by mouth 3 (three) times daily as needed for pain.  . pioglitazone (ACTOS) 30 MG tablet Take 15 mg by mouth 2 (two) times a week.   . Vitamin D, Ergocalciferol, (DRISDOL) 50000 UNITS CAPS capsule TAKE 1 CAPSULE (50,000 UNITS TOTAL) BY MOUTH EVERY 7 (SEVEN) DAYS.  Marland Kitchen amoxicillin (AMOXIL) 875 MG tablet Take 1 tablet (875 mg total) by mouth 2 (two) times daily.  . fluconazole (DIFLUCAN) 150 MG tablet Take one tablet x 1 and then repeat x 1 in three days if persistent symptoms.  . [DISCONTINUED] cephALEXin (KEFLEX) 500 MG capsule Take 1 capsule (500 mg total) by mouth 2 (two) times daily.  . [DISCONTINUED] ipratropium-albuterol (DUONEB) 0.5-2.5 (3) MG/3ML SOLN TAKE 3 MLS BY NEBULIZATION EVERY 8 (EIGHT) HOURS AS NEEDED.   No facility-administered encounter medications on file as of 12/10/2014.    Review of Systems  Constitutional:       She has adjusted her diet and lost weight.  Feels better.   HENT: Negative for congestion, sinus pressure and sore throat.   Eyes: Negative for pain and visual disturbance.  Respiratory: Negative for cough, chest tightness and shortness of breath.   Cardiovascular: Negative for chest pain, palpitations and leg swelling.  Gastrointestinal: Negative for nausea, vomiting, abdominal pain and diarrhea.  Genitourinary: Positive for frequency.       Increased urinary urgency.  Increased vaginal irritation.  Feels c/w yeast infection.    Musculoskeletal: Negative for back pain and joint swelling.  Skin: Negative for color change and rash.  Neurological: Negative for dizziness, light-headedness and headaches.  Hematological: Negative for adenopathy. Does not bruise/bleed easily.  Psychiatric/Behavioral: Negative for dysphoric mood and agitation.         Objective:    Physical Exam  Constitutional: She is oriented to person, place, and time. She appears well-developed and well-nourished.  HENT:  Nose: Nose normal.  Mouth/Throat: Oropharynx is clear and moist.  Eyes: Right eye exhibits no discharge. Left eye exhibits no discharge. No scleral icterus.  Neck: Neck supple. No thyromegaly present.  Cardiovascular: Normal rate and regular rhythm.   Pulmonary/Chest: Breath sounds normal. No accessory muscle usage. No tachypnea. No respiratory distress. She has no decreased breath sounds. She has no wheezes. She has no rhonchi. Right breast exhibits no inverted nipple, no mass, no  nipple discharge and no tenderness (no axillary adenopathy). Left breast exhibits no inverted nipple, no mass, no nipple discharge and no tenderness (no axilarry adenopathy).  Abdominal: Soft. Bowel sounds are normal. There is no tenderness.  Genitourinary:  She declined pelvic exam.    Musculoskeletal: She exhibits no edema or tenderness.  Lymphadenopathy:    She has no cervical adenopathy.  Neurological: She is alert and oriented to person, place, and time.  Skin: Skin is warm. No rash noted. No erythema.  Psychiatric: She has a normal mood and affect. Her behavior is normal.    BP 114/70 mmHg  Pulse 79  Temp(Src) 97.5 F (36.4 C) (Oral)  Ht 5' 5"  (1.651 m)  Wt 227 lb 8 oz (103.193 kg)  BMI 37.86 kg/m2  SpO2 95% Wt Readings from Last 3 Encounters:  12/10/14 227 lb 8 oz (103.193 kg)  07/22/14 227 lb (102.967 kg)  12/02/14 226 lb (102.513 kg)     Lab Results  Component Value Date   WBC 9.0 07/02/2013   HGB 14.2 07/02/2013   HCT 42.9 07/02/2013   PLT 194.0 07/02/2013   GLUCOSE 187* 07/24/2013   CHOL 240* 07/24/2013   TRIG 260.0* 07/24/2013   HDL 39.80 07/24/2013   LDLDIRECT 153.9 03/25/2012   LDLCALC 148* 07/24/2013   ALT 27 07/24/2013   AST 25 07/24/2013   NA 140 07/24/2013   K 4.7 07/24/2013   CL 102 07/24/2013   CREATININE 0.8 07/24/2013    BUN 12 07/24/2013   CO2 27 07/24/2013   TSH 1.25 07/24/2013   HGBA1C 8.8* 07/24/2013   MICROALBUR 0.1 07/24/2013       Assessment & Plan:   Problem List Items Addressed This Visit    Acid reflux    Controlled.        Anxiety and depression    Doing well.  Feels better.  Follow.       Asthma    Breathing stable.  Continue current inhaler regimen.        Crohn's disease    No abdominal pain.  Bowels doing well.        Diabetes mellitus    Sugars improved as outlined.  Seeing Dr Eddie Dibbles.        Relevant Orders   TSH   Hemoglobin A1c   Microalbumin / creatinine urine ratio   Essential (primary) hypertension    Blood pressure under good control.  Continue same medication regimen.  Follow pressures.  Follow metabolic panel.        Relevant Orders   CBC with Differential/Platelet   Basic metabolic panel   Health care maintenance    Physical 09/09/14.  PAP 10/01/13 - negative with negative HPV.  Schedule mammogram.  Overdue.  Colonoscopy 06/27/07 - small polyp in ascending colon, diverticulosis and internal hemorrhoids.        Hypercholesterolemia    Low cholesterol diet and exercise.  Follow lipid panel.  Has declined cholesterol medication.        Relevant Orders   Lipid panel   Hepatic function panel   UTI (urinary tract infection) - Primary    Recently treated for uti.  Still with persistent symptoms.  Has improved.  Recheck urinalysis and culture.  Urine dip as outlined.  Amoxicillin as directed.  Follow.        Relevant Medications   fluconazole (DIFLUCAN) 150 MG tablet   Other Relevant Orders   POCT urinalysis dipstick (Completed)   CULTURE, URINE COMPREHENSIVE (Completed)  Other Visit Diagnoses    Screening breast examination        Relevant Orders    MM DIGITAL SCREENING BILATERAL    CULTURE, URINE COMPREHENSIVE (Completed)        Einar Pheasant, MD

## 2014-12-10 NOTE — Progress Notes (Signed)
Pre-visit discussion using our clinic review tool. No additional management support is needed unless otherwise documented below in the visit note.  

## 2014-12-11 ENCOUNTER — Encounter: Payer: Self-pay | Admitting: Internal Medicine

## 2014-12-11 DIAGNOSIS — Z0000 Encounter for general adult medical examination without abnormal findings: Secondary | ICD-10-CM | POA: Insufficient documentation

## 2014-12-11 NOTE — Assessment & Plan Note (Signed)
Controlled.  

## 2014-12-11 NOTE — Assessment & Plan Note (Signed)
Sugars improved as outlined.  Seeing Dr Eddie Dibbles.

## 2014-12-11 NOTE — Assessment & Plan Note (Signed)
Low cholesterol diet and exercise.  Follow lipid panel.  Has declined cholesterol medication.

## 2014-12-11 NOTE — Assessment & Plan Note (Signed)
Physical 09/09/14.  PAP 10/01/13 - negative with negative HPV.  Schedule mammogram.  Overdue.  Colonoscopy 06/27/07 - small polyp in ascending colon, diverticulosis and internal hemorrhoids.

## 2014-12-11 NOTE — Assessment & Plan Note (Signed)
No abdominal pain.  Bowels doing well.

## 2014-12-11 NOTE — Assessment & Plan Note (Signed)
Blood pressure under good control.  Continue same medication regimen.  Follow pressures.  Follow metabolic panel.   

## 2014-12-11 NOTE — Assessment & Plan Note (Signed)
Breathing stable.  Continue current inhaler regimen.

## 2014-12-11 NOTE — Assessment & Plan Note (Signed)
Doing well.  Feels better.  Follow.

## 2014-12-11 NOTE — Assessment & Plan Note (Signed)
Recently treated for uti.  Still with persistent symptoms.  Has improved.  Recheck urinalysis and culture.  Urine dip as outlined.  Amoxicillin as directed.  Follow.

## 2014-12-14 LAB — CULTURE, URINE COMPREHENSIVE: Colony Count: >=100000

## 2014-12-15 ENCOUNTER — Other Ambulatory Visit: Payer: Self-pay | Admitting: *Deleted

## 2014-12-15 NOTE — Telephone Encounter (Signed)
Pt faxed over a request for med refills before she goes to [place] the end of this month. Pt requesting a 90 day supply with 3 refills on each medication. Pt would like a phone call when they are ready so she can pick them up at [phone] or leave a message at [phone].

## 2014-12-16 MED ORDER — ALBUTEROL SULFATE HFA 108 (90 BASE) MCG/ACT IN AERS: 2.0000 | INHALATION_SPRAY | Freq: Four times a day (QID) | RESPIRATORY_TRACT | Status: DC | PRN

## 2014-12-16 MED ORDER — BUPROPION HCL ER (XL) 150 MG PO TB24: 150.0000 mg | ORAL_TABLET | Freq: Every day | ORAL | Status: DC

## 2014-12-16 MED ORDER — METOPROLOL SUCCINATE ER 50 MG PO TB24: 50.0000 mg | ORAL_TABLET | Freq: Every day | ORAL | Status: DC

## 2014-12-16 MED ORDER — LOSARTAN POTASSIUM 100 MG PO TABS: 100.0000 mg | ORAL_TABLET | Freq: Every day | ORAL | Status: DC

## 2014-12-16 MED ORDER — METFORMIN HCL 500 MG PO TABS: ORAL_TABLET | ORAL | Status: DC

## 2014-12-16 MED ORDER — AMLODIPINE BESYLATE 5 MG PO TABS: 5.0000 mg | ORAL_TABLET | Freq: Every day | ORAL | Status: DC

## 2014-12-16 MED ORDER — ESOMEPRAZOLE MAGNESIUM 40 MG PO CPDR: 40.0000 mg | DELAYED_RELEASE_CAPSULE | Freq: Two times a day (BID) | ORAL | Status: DC

## 2014-12-16 MED ORDER — FLUTICASONE-SALMETEROL 230-21 MCG/ACT IN AERO: 2.0000 | INHALATION_SPRAY | Freq: Two times a day (BID) | RESPIRATORY_TRACT | Status: DC

## 2014-12-16 MED ORDER — MONTELUKAST SODIUM 10 MG PO TABS: 10.0000 mg | ORAL_TABLET | Freq: Every day | ORAL | Status: DC

## 2014-12-16 NOTE — Telephone Encounter (Signed)
rx signed and placed in your box.  Will need to clarify dose of lasix.  I did not print this rx until dose clarified.  Thanks

## 2014-12-17 ENCOUNTER — Telehealth: Payer: Self-pay

## 2014-12-17 NOTE — Telephone Encounter (Signed)
Pt need written prescription for the medication she faxed.

## 2014-12-18 NOTE — Telephone Encounter (Signed)
I signed these prescriptions and placed in box.  The lasix dose needs to be clarified.

## 2014-12-20 NOTE — Telephone Encounter (Signed)
Spoke with patient & she will call back with correct dose & will pick up Rx's this week.

## 2014-12-21 ENCOUNTER — Other Ambulatory Visit: Payer: Self-pay | Admitting: Internal Medicine

## 2014-12-21 MED ORDER — FUROSEMIDE 20 MG PO TABS: 20.0000 mg | ORAL_TABLET | Freq: Every day | ORAL | Status: DC

## 2014-12-21 NOTE — Telephone Encounter (Signed)
Medication was clarified & Rx's placed up front

## 2014-12-21 NOTE — Telephone Encounter (Signed)
Rx's have all been printed including the 65m of Lasix. Rx's placed up front for pick up

## 2014-12-21 NOTE — Progress Notes (Signed)
Refilled lasix #90 with one refill.

## 2014-12-23 ENCOUNTER — Other Ambulatory Visit (INDEPENDENT_AMBULATORY_CARE_PROVIDER_SITE_OTHER): Payer: PRIVATE HEALTH INSURANCE

## 2014-12-23 DIAGNOSIS — I10 Essential (primary) hypertension: Secondary | ICD-10-CM | POA: Diagnosis not present

## 2014-12-23 DIAGNOSIS — E119 Type 2 diabetes mellitus without complications: Secondary | ICD-10-CM

## 2014-12-23 DIAGNOSIS — E7800 Pure hypercholesterolemia, unspecified: Secondary | ICD-10-CM

## 2014-12-23 LAB — BASIC METABOLIC PANEL
BUN: 18 mg/dL (ref 6–23)
CHLORIDE: 105 meq/L (ref 96–112)
CO2: 24 meq/L (ref 19–32)
Calcium: 9.6 mg/dL (ref 8.4–10.5)
Creatinine, Ser: 0.88 mg/dL (ref 0.40–1.20)
GFR: 69.69 mL/min (ref >60.00)
GLUCOSE: 112 mg/dL — AB (ref 70–99)
POTASSIUM: 4.4 meq/L (ref 3.5–5.1)
SODIUM: 142 meq/L (ref 135–145)

## 2014-12-23 LAB — HEPATIC FUNCTION PANEL
ALK PHOS: 63 U/L (ref 39–117)
ALT: 21 U/L (ref 0–35)
AST: 15 U/L (ref 0–37)
Albumin: 3.9 g/dL (ref 3.5–5.2)
BILIRUBIN DIRECT: 0.1 mg/dL (ref 0.0–0.3)
BILIRUBIN TOTAL: 0.3 mg/dL (ref 0.2–1.2)
Total Protein: 7.1 g/dL (ref 6.0–8.3)

## 2014-12-23 LAB — MICROALBUMIN / CREATININE URINE RATIO
Creatinine,U: 101.7 mg/dL
MICROALB/CREAT RATIO: 0.7 mg/g (ref 0.0–30.0)
Microalb, Ur: <0.7 mg/dL (ref 0.0–1.9)

## 2014-12-23 LAB — CBC WITH DIFFERENTIAL/PLATELET
BASOS PCT: 0.7 % (ref 0.0–3.0)
Basophils Absolute: 0.1 10*3/uL (ref 0.0–0.1)
EOS ABS: 0.3 10*3/uL (ref 0.0–0.7)
EOS PCT: 3.8 % (ref 0.0–5.0)
HEMATOCRIT: 46 % (ref 36.0–46.0)
Hemoglobin: 15.5 g/dL — ABNORMAL HIGH (ref 12.0–15.0)
LYMPHS PCT: 27.7 % (ref 12.0–46.0)
Lymphs Abs: 2.2 10*3/uL (ref 0.7–4.0)
MCHC: 33.8 g/dL (ref 30.0–36.0)
MCV: 86.2 fl (ref 78.0–100.0)
Monocytes Absolute: 0.7 10*3/uL (ref 0.1–1.0)
Monocytes Relative: 8.4 % (ref 3.0–12.0)
NEUTROS ABS: 4.7 10*3/uL (ref 1.4–7.7)
Neutrophils Relative %: 59.4 % (ref 43.0–77.0)
PLATELETS: 179 10*3/uL (ref 150.0–400.0)
RBC: 5.33 Mil/uL — ABNORMAL HIGH (ref 3.87–5.11)
RDW: 15.3 % (ref 11.5–15.5)
WBC: 7.9 10*3/uL (ref 4.0–10.5)

## 2014-12-23 LAB — LDL CHOLESTEROL, DIRECT: LDL DIRECT: 109 mg/dL

## 2014-12-23 LAB — LIPID PANEL
CHOL/HDL RATIO: 4
CHOLESTEROL: 185 mg/dL (ref 0–200)
HDL: 43.3 mg/dL (ref >39.00)
NONHDL: 141.28
Triglycerides: 320 mg/dL — ABNORMAL HIGH (ref 0.0–149.0)
VLDL: 64 mg/dL — AB (ref 0.0–40.0)

## 2014-12-23 LAB — HEMOGLOBIN A1C: Hgb A1c MFr Bld: 6.6 % — ABNORMAL HIGH (ref 4.6–6.5)

## 2014-12-23 LAB — TSH: TSH: 1.53 u[IU]/mL (ref 0.35–4.50)

## 2014-12-24 ENCOUNTER — Encounter: Payer: Self-pay | Admitting: *Deleted

## 2015-01-10 LAB — HM MAMMOGRAPHY

## 2015-01-11 ENCOUNTER — Encounter: Payer: Self-pay | Admitting: Internal Medicine

## 2015-01-24 ENCOUNTER — Telehealth: Payer: Self-pay | Admitting: Internal Medicine

## 2015-01-24 NOTE — Telephone Encounter (Signed)
Chart updated

## 2015-01-24 NOTE — Telephone Encounter (Signed)
Pt had flu shot at her job on 01/24/2015. Pt wanted you to know so it could be documented. Thank You!

## 2015-01-24 NOTE — Telephone Encounter (Signed)
Thank You.

## 2015-03-16 ENCOUNTER — Encounter: Payer: Self-pay | Admitting: Family Medicine

## 2015-03-16 ENCOUNTER — Ambulatory Visit (INDEPENDENT_AMBULATORY_CARE_PROVIDER_SITE_OTHER): Payer: PRIVATE HEALTH INSURANCE | Admitting: Family Medicine

## 2015-03-16 VITALS — BP 114/72 | HR 86 | Temp 97.9°F | Ht 65.0 in | Wt 227.0 lb

## 2015-03-16 DIAGNOSIS — R300 Dysuria: Secondary | ICD-10-CM

## 2015-03-16 LAB — POCT URINALYSIS DIPSTICK
Bilirubin, UA: NEGATIVE
Blood, UA: NEGATIVE
Ketones, UA: NEGATIVE
LEUKOCYTES UA: NEGATIVE
NITRITE UA: NEGATIVE
PROTEIN UA: NEGATIVE
SPEC GRAV UA: 1.02
UROBILINOGEN UA: 0.2
pH, UA: 5.5

## 2015-03-16 LAB — URINALYSIS, MICROSCOPIC ONLY

## 2015-03-16 MED ORDER — FLUCONAZOLE 150 MG PO TABS: 150.0000 mg | ORAL_TABLET | ORAL | Status: DC

## 2015-03-16 NOTE — Progress Notes (Signed)
[name], female   DOB: April 09, 1956, 59 y.o.   MRN: [id number]  Tommi Rumps, MD Phone: [phone]  Amanda Wiley is a 59 y.o. female who presents today for same-day visit.  UTI: Patient notes 1 week of dysuria, urinary frequency, and urinary urgency. She has no abdominal pain. She does note some mild right-sided low back pain that is achy sensation that started today. She denies any fevers. She does note some chills. She has no numbness or weakness in her legs. She denies saddle anesthesia, bowel and bladder incontinence, fevers, and history of cancer. She denies vaginal discharge. Did previously have a yeast infections though this was treated with Diflucan and Monistat, though patient reports is difficult to resolve. Most recent UTI was in August. She had resolution of symptoms with treatment of Keflex. No history of STD. She has not been sexually active in the past 4 years. She reports she has had a hysterectomy and bilateral salpingo-oophorectomy.  PMH: Former smoker.   ROS see history of present illness  Objective  Physical Exam Filed Vitals:   03/16/15 1355  BP: 114/72  Pulse: 86  Temp: 97.9 F (36.6 C)   Physical Exam  Constitutional: She is well-developed, well-nourished, and in no distress.  HENT:  Head: Normocephalic and atraumatic.  Cardiovascular: Normal rate, regular rhythm and normal heart sounds.  Exam reveals no gallop and no friction rub.   No murmur heard. Pulmonary/Chest: Effort normal and breath sounds normal. No respiratory distress. She has no wheezes. She has no rales.  Abdominal: Soft. Bowel sounds are normal. She exhibits no distension. There is no tenderness. There is no rebound and no guarding.  Genitourinary: Cervix normal, right adnexa normal and left adnexa normal. Vaginal discharge (thick white mild amount) found.  No cervical motion tenderness  Musculoskeletal:  No midline spine tenderness, mild right lumbar spine muscle  tenderness, no CVA tenderness, no swelling or erythema of the back, no midline step-offs  Neurological: She is alert.  5 out of 5 strength in bilateral quads, hamstrings, plantar flexion, dorsiflexion, sensation to light touch intact in bilateral lower extremities, 2+ patellar reflexes  Skin: Skin is warm and dry. She is not diaphoretic.     Assessment/Plan: Please see individual problem list.  Dysuria Patient with new onset of symptoms of dysuria, urgency, frequency, and vaginal itching. UA was not consistent with UTI. Pelvic exam was completed revealing mild thick white discharge. This is likely consistent with a yeast infection as she has recently been on antibiotics and had a yeast infection previously that was hard to treat. She otherwise has benign exam today. We will treat her with Diflucan. We will send her urine for culture and microscopy. We will send a wet prep. She is given return precautions.    Orders Placed This Encounter  Procedures  . Urine Culture  . WET PREP BY MOLECULAR PROBE  . Urine Microscopic Only  . POCT Urinalysis Dipstick    Meds ordered this encounter  Medications  . fluconazole (DIFLUCAN) 150 MG tablet    Sig: Take 1 tablet (150 mg total) by mouth every 3 (three) days. Take 2 doses and if not improved take the 3rd dose    Dispense:  3 tablet    Refill:  0     Tommi Rumps

## 2015-03-16 NOTE — Assessment & Plan Note (Addendum)
Patient with new onset of symptoms of dysuria, urgency, frequency, and vaginal itching. UA was not consistent with UTI. Pelvic exam was completed revealing mild thick white discharge. This is likely consistent with a yeast infection as she has recently been on antibiotics and had a yeast infection previously that was hard to treat. She otherwise has benign exam today. We will treat her with Diflucan. We will send her urine for culture and microscopy. We will send a wet prep. She is given return precautions.

## 2015-03-16 NOTE — Progress Notes (Signed)
Pre visit review using our clinic review tool, if applicable. No additional management support is needed unless otherwise documented below in the visit note. 

## 2015-03-16 NOTE — Patient Instructions (Signed)
Nice to meet you. I suspect your symptoms are related to a yeast infection. We will send off testing for yeast infection and for urine infection. If you develop abdominal pain, fevers, worsening back pain, numbness, weakness, loss of bowel or bladder function, or any new or change in symptoms please seek medical attention.

## 2015-03-17 ENCOUNTER — Other Ambulatory Visit: Payer: Self-pay | Admitting: Family Medicine

## 2015-03-17 LAB — WET PREP BY MOLECULAR PROBE
CANDIDA SPECIES: POSITIVE — AB
Gardnerella vaginalis: POSITIVE — AB
Trichomonas vaginosis: NEGATIVE

## 2015-03-17 MED ORDER — METRONIDAZOLE 500 MG PO TABS: 500.0000 mg | ORAL_TABLET | Freq: Two times a day (BID) | ORAL | Status: DC

## 2015-03-18 ENCOUNTER — Other Ambulatory Visit: Payer: Self-pay | Admitting: Family Medicine

## 2015-03-20 ENCOUNTER — Other Ambulatory Visit: Payer: Self-pay | Admitting: Family Medicine

## 2015-03-20 ENCOUNTER — Telehealth: Payer: Self-pay | Admitting: Family Medicine

## 2015-03-20 LAB — URINE CULTURE

## 2015-03-20 MED ORDER — CEPHALEXIN 500 MG PO CAPS: 500.0000 mg | ORAL_CAPSULE | Freq: Two times a day (BID) | ORAL | Status: DC

## 2015-03-20 NOTE — Telephone Encounter (Signed)
Patient's urine culture returned today. I called her to inform her that it was positive for UTI. Urine culture was pansensitive. She has allergy to Avelox and sulfa. She reports her symptoms are improved after having been on the Flagyl and Diflucan. She does note still having some minimal dysuria. Given that she was improved I offered her the option to monitor with completion of Flagyl versus starting on another antibiotic. She opted to start on treatment for the UTI. Advised her that we would treat her with Keflex as it is sensitive to this. She voiced understanding. This was sent in to her pharmacy.

## 2015-04-14 ENCOUNTER — Ambulatory Visit: Payer: PRIVATE HEALTH INSURANCE | Admitting: Internal Medicine

## 2015-04-19 ENCOUNTER — Ambulatory Visit (INDEPENDENT_AMBULATORY_CARE_PROVIDER_SITE_OTHER): Payer: PRIVATE HEALTH INSURANCE | Admitting: Internal Medicine

## 2015-04-19 ENCOUNTER — Encounter: Payer: Self-pay | Admitting: Internal Medicine

## 2015-04-19 VITALS — BP 110/70 | HR 89 | Temp 97.6°F | Resp 18 | Ht 65.0 in | Wt 226.0 lb

## 2015-04-19 DIAGNOSIS — M545 Low back pain, unspecified: Secondary | ICD-10-CM | POA: Insufficient documentation

## 2015-04-19 DIAGNOSIS — K219 Gastro-esophageal reflux disease without esophagitis: Secondary | ICD-10-CM

## 2015-04-19 DIAGNOSIS — M79674 Pain in right toe(s): Secondary | ICD-10-CM

## 2015-04-19 DIAGNOSIS — J4520 Mild intermittent asthma, uncomplicated: Secondary | ICD-10-CM

## 2015-04-19 DIAGNOSIS — F419 Anxiety disorder, unspecified: Secondary | ICD-10-CM

## 2015-04-19 DIAGNOSIS — E669 Obesity, unspecified: Secondary | ICD-10-CM

## 2015-04-19 DIAGNOSIS — E119 Type 2 diabetes mellitus without complications: Secondary | ICD-10-CM

## 2015-04-19 DIAGNOSIS — K5090 Crohn's disease, unspecified, without complications: Secondary | ICD-10-CM

## 2015-04-19 DIAGNOSIS — F329 Major depressive disorder, single episode, unspecified: Secondary | ICD-10-CM

## 2015-04-19 DIAGNOSIS — M546 Pain in thoracic spine: Secondary | ICD-10-CM

## 2015-04-19 DIAGNOSIS — I10 Essential (primary) hypertension: Secondary | ICD-10-CM | POA: Diagnosis not present

## 2015-04-19 DIAGNOSIS — E7800 Pure hypercholesterolemia, unspecified: Secondary | ICD-10-CM

## 2015-04-19 DIAGNOSIS — E559 Vitamin D deficiency, unspecified: Secondary | ICD-10-CM

## 2015-04-19 DIAGNOSIS — M549 Dorsalgia, unspecified: Secondary | ICD-10-CM | POA: Insufficient documentation

## 2015-04-19 DIAGNOSIS — F418 Other specified anxiety disorders: Secondary | ICD-10-CM

## 2015-04-19 NOTE — Progress Notes (Signed)
Pre-visit discussion using our clinic review tool. No additional management support is needed unless otherwise documented below in the visit note.  

## 2015-04-19 NOTE — Progress Notes (Signed)
[name], female   DOB: 1955-08-28, 60 y.o.   MRN: [id number]   Subjective:    [name], female    DOB: December 08, 1955, 60 y.o.   MRN: [id number]  HPI  Patient with past history of hypercholesterolemia, diabetes, crohn's disease, GERD and diabetes.  She comes in today for a scheduled follow up of these issues.  She reports her sugars are doing well.  AM sugars averaging 130 and pm sugars 120.  Discussed diet and exercise.  She fell within the week.  Injured her right fifth toe.  Decreased pain now.  Still with some pain.  No head injury.  No other significant injury.  She does report back pain.  Present for one month.  Intermittent.  Takes tylenol and uses bengay.  Hurts worse with twisting.  No pain radiating down her leg.  Increased stress with her mother's health issues.  She feels she is handling things relatively well.     Past Medical History  Diagnosis Date  . Hypertension   . Diabetes mellitus ([place])   . Hypercholesterolemia   . Crohn's disease ([place])   . IBS (irritable bowel syndrome)   . Depression   . Nephrolithiasis     s/p lithotripsy [facility])  . COPD (chronic obstructive pulmonary disease) [facility])    Past Surgical History  Procedure Laterality Date  . Tonsillectomy  1966  . Laparoscopic supracervical hysterectomy  1985    left ovary not removed  . Lithotripsy      x7  . Laparoscopic cholecystectomy  2005    Dr Tamala Julian  . Polypectomy    . Shoulder surgery      right (pinning)  . Shoulder surgery      left (pinning)  . Breast biopsy  2013  . Abdominal hysterectomy  1985  . Appendectomy  1985   Family History  Problem Relation Age of Onset  . Heart disease Father   . COPD Mother   . Hypercholesterolemia Mother   . Emphysema Mother   . Hypertension Brother   . Emphysema      grandmother  . Breast cancer Neg Hx   . Colon cancer Neg Hx   . Emphysema Maternal Grandmother    Social History   Social History  .  Marital Status: Married    Spouse Name: N/A  . Number of Children: 2  . Years of Education: N/A   Occupational History  .  [employer]   Social History Main Topics  . Smoking status: Former Smoker    Quit date: 04/09/1994  . Smokeless tobacco: Never Used     Comment: QUIT IN 1990'S  . Alcohol Use: 0.0 oz/week    0 Standard drinks or equivalent per week     Comment: OCCASIONALLY  . Drug Use: No  . Sexual Activity: Not Asked   Other Topics Concern  . None   Social History Narrative    Outpatient Encounter Prescriptions as of 04/19/2015  Medication Sig  . Albiglutide (TANZEUM) 30 MG PEN Inject 50 mg into the skin. Take every Tuesday  . albuterol (PROVENTIL HFA;VENTOLIN HFA) 108 (90 BASE) MCG/ACT inhaler Inhale 2 puffs into the lungs every 6 (six) hours as needed.  Mar[place] Kitchen amLODipine (NORVASC) 5 MG tablet Take 1 tablet (5 mg total) by mouth daily.  Marland Kitchen buPROPion (WELLBUTRIN XL) 150 MG 24 hr tablet Take 1 tablet (150 mg total) by mouth daily.  . cyclobenzaprine (FLEXERIL) 5 MG tablet Take 5 mg  by mouth 3 (three) times daily as needed for muscle spasms.  . empagliflozin (JARDIANCE) 25 MG TABS tablet Take 25 mg by mouth daily.  Marland Kitchen esomeprazole (NEXIUM) 40 MG capsule Take 1 capsule (40 mg total) by mouth 2 (two) times daily.  . fluticasone-salmeterol (ADVAIR HFA) 230-21 MCG/ACT inhaler Inhale 2 puffs into the lungs 2 (two) times daily.  . furosemide (LASIX) 20 MG tablet Take 1 tablet (20 mg total) by mouth daily.  Marland Kitchen HYDROcodone-acetaminophen (NORCO/VICODIN) 5-325 MG tablet Take 1 tablet by mouth every 6 (six) hours as needed for moderate pain.  . hyoscyamine (LEVSIN SL) 0.125 MG SL tablet Place 0.125 mg under the tongue every 6 (six) hours as needed.  Marland Kitchen ipratropium-albuterol (DUONEB) 0.5-2.5 (3) MG/3ML SOLN TAKE 3 MLS BY NEBULIZATION EVERY 8 (EIGHT) HOURS AS NEEDED.  Marland Kitchen losartan (COZAAR) 100 MG tablet Take 1 tablet (100 mg total) by mouth daily.  . metFORMIN (GLUCOPHAGE) 500 MG tablet 2  tablets bid  . metoprolol succinate (TOPROL XL) 50 MG 24 hr tablet Take 1 tablet (50 mg total) by mouth daily. Take with or immediately following a meal.  . montelukast (SINGULAIR) 10 MG tablet Take 1 tablet (10 mg total) by mouth daily.  . phenazopyridine (PYRIDIUM) 200 MG tablet Take 1 tablet (200 mg total) by mouth 3 (three) times daily as needed for pain.  . Vitamin D, Ergocalciferol, (DRISDOL) 50000 UNITS CAPS capsule TAKE 1 CAPSULE (50,000 UNITS TOTAL) BY MOUTH EVERY 7 (SEVEN) DAYS.  . fluticasone (FLONASE) 50 MCG/ACT nasal spray Place 2 sprays into the nose daily. Reported on 04/19/2015  . ipratropium-albuterol (DUONEB) 0.5-2.5 (3) MG/3ML SOLN TAKE 3 MLS BY NEBULIZATION EVERY 8 (EIGHT) HOURS AS NEEDED. (Patient not taking: Reported on 04/19/2015)  . [DISCONTINUED] amoxicillin (AMOXIL) 875 MG tablet Take 1 tablet (875 mg total) by mouth 2 (two) times daily.  . [DISCONTINUED] cephALEXin (KEFLEX) 500 MG capsule Take 1 capsule (500 mg total) by mouth 2 (two) times daily.  . [DISCONTINUED] fluconazole (DIFLUCAN) 150 MG tablet Take 1 tablet (150 mg total) by mouth every 3 (three) days. Take 2 doses and if not improved take the 3rd dose  . [DISCONTINUED] metroNIDAZOLE (FLAGYL) 500 MG tablet Take 1 tablet (500 mg total) by mouth 2 (two) times daily.  . [DISCONTINUED] pioglitazone (ACTOS) 30 MG tablet Take 15 mg by mouth 2 (two) times a week.    No facility-administered encounter medications on file as of 04/19/2015.    Review of Systems  Constitutional: Negative for appetite change and unexpected weight change.  HENT: Negative for congestion and sinus pressure.   Eyes: Negative for discharge and redness.  Respiratory: Negative for cough, chest tightness and shortness of breath.   Cardiovascular: Negative for chest pain, palpitations and leg swelling.  Gastrointestinal: Negative for nausea, vomiting, abdominal pain and diarrhea.  Genitourinary: Negative for dysuria and difficulty urinating.    Musculoskeletal: Positive for back pain. Negative for joint swelling.  Skin: Negative for color change and rash.  Neurological: Negative for dizziness, light-headedness and headaches.  Psychiatric/Behavioral: Negative for dysphoric mood and agitation.       Objective:    Physical Exam  Constitutional: She appears well-developed and well-nourished. No distress.  HENT:  Nose: Nose normal.  Mouth/Throat: Oropharynx is clear and moist.  Eyes: Conjunctivae are normal. Right eye exhibits no discharge. Left eye exhibits no discharge.  Neck: Neck supple. No thyromegaly present.  Cardiovascular: Normal rate and regular rhythm.   Pulmonary/Chest: Breath sounds normal. No respiratory distress. She has  no wheezes.  Abdominal: Soft. Bowel sounds are normal. There is no tenderness.  Musculoskeletal: She exhibits no edema or tenderness.  Back tenderness - right mid back - over right lateral ribs.  Increased pain - right fifth toe - to palpation.    Lymphadenopathy:    She has no cervical adenopathy.  Skin: No rash noted. No erythema.  Psychiatric: She has a normal mood and affect. Her behavior is normal.    BP 110/70 mmHg  Pulse 89  Temp(Src) 97.6 F (36.4 C) (Oral)  Resp 18  Ht 5' 5"  (1.651 m)  Wt 226 lb (102.513 kg)  BMI 37.61 kg/m2  SpO2 94% Wt Readings from Last 3 Encounters:  04/19/15 226 lb (102.513 kg)  03/16/15 227 lb (102.967 kg)  12/10/14 227 lb 8 oz (103.193 kg)     Lab Results  Component Value Date   WBC 7.9 12/23/2014   HGB 15.5* 12/23/2014   HCT 46.0 12/23/2014   PLT 179.0 12/23/2014   GLUCOSE 112* 12/23/2014   CHOL 185 12/23/2014   TRIG 320.0* 12/23/2014   HDL 43.30 12/23/2014   LDLDIRECT 109.0 12/23/2014   LDLCALC 148* 07/24/2013   ALT 21 12/23/2014   AST 15 12/23/2014   NA 142 12/23/2014   K 4.4 12/23/2014   CL 105 12/23/2014   CREATININE 0.88 12/23/2014   BUN 18 12/23/2014   CO2 24 12/23/2014   TSH 1.53 12/23/2014   HGBA1C 6.6* 12/23/2014    MICROALBUR <0.7 12/23/2014       Assessment & Plan:   Problem List Items Addressed This Visit    Anxiety and depression    Handling things well.  Follow.        Asthma    Breathing stable.  Continue current inhaler regimen.        Back pain - Primary    Persistent right mid back pain.  No known injury.  Check xray.  Tylenol as directed.        Relevant Medications   cyclobenzaprine (FLEXERIL) 5 MG tablet   HYDROcodone-acetaminophen (NORCO/VICODIN) 5-325 MG tablet   Other Relevant Orders   DG Thoracic Spine 2 View (Completed)   DG Ribs Unilateral Right (Completed)   Crohn's disease ([place])    Bowels stable.  No abdominal pain or cramping.        Relevant Orders   CBC with Differential/Platelet   Diabetes mellitus ([place])    Seeing Dr Eddie Dibbles.  Sugars appear to be doing well.  Follow.        Relevant Orders   Hemoglobin A1c   GERD (gastroesophageal reflux disease)    Upper symptoms controlled.  On nexium.        Hypercholesterolemia    Low cholesterol diet and exercise.  Follow lipid panel.        Relevant Orders   Lipid panel   Hepatic function panel   Hypertension    Blood pressure under good control.  Continue same medication regimen.  Follow pressures.  Follow metabolic panel.        Relevant Orders   Basic metabolic panel   Obesity (BMI 30-39.9)    Diet and exercise.        Toe pain    Persistent toe pain s/p injury.  Discussed xray.  She declines.  Will follow.  Post op shoe.        Vitamin D deficiency    Continue supplements.  Follow vitamin D level.        Relevant Orders  VITAMIN D 25 Hydroxy (Vit-D Deficiency, Fractures)       Einar Pheasant, MD

## 2015-04-20 ENCOUNTER — Ambulatory Visit
Admission: RE | Admit: 2015-04-20 | Discharge: 2015-04-20 | Disposition: A | Discharge status: Home / Self Care | Payer: PRIVATE HEALTH INSURANCE | Source: Ambulatory Visit | Attending: Internal Medicine | Admitting: Internal Medicine

## 2015-04-20 DIAGNOSIS — M546 Pain in thoracic spine: Secondary | ICD-10-CM

## 2015-04-20 DIAGNOSIS — M47814 Spondylosis without myelopathy or radiculopathy, thoracic region: Secondary | ICD-10-CM | POA: Insufficient documentation

## 2015-04-24 ENCOUNTER — Encounter: Payer: Self-pay | Admitting: Internal Medicine

## 2015-04-24 DIAGNOSIS — M79676 Pain in unspecified toe(s): Secondary | ICD-10-CM | POA: Insufficient documentation

## 2015-04-24 NOTE — Assessment & Plan Note (Signed)
Diet and exercise.   

## 2015-04-24 NOTE — Assessment & Plan Note (Signed)
Handling things well.  Follow.

## 2015-04-24 NOTE — Assessment & Plan Note (Signed)
Upper symptoms controlled.  On nexium.

## 2015-04-24 NOTE — Assessment & Plan Note (Signed)
Persistent toe pain s/p injury.  Discussed xray.  She declines.  Will follow.  Post op shoe.

## 2015-04-24 NOTE — Assessment & Plan Note (Signed)
Continue supplements.  Follow vitamin D level.

## 2015-04-24 NOTE — Assessment & Plan Note (Signed)
Persistent right mid back pain.  No known injury.  Check xray.  Tylenol as directed.

## 2015-04-24 NOTE — Assessment & Plan Note (Signed)
Blood pressure under good control.  Continue same medication regimen.  Follow pressures.  Follow metabolic panel.   

## 2015-04-24 NOTE — Assessment & Plan Note (Signed)
Seeing Dr Eddie Dibbles.  Sugars appear to be doing well.  Follow.

## 2015-04-24 NOTE — Assessment & Plan Note (Signed)
Low cholesterol diet and exercise.  Follow lipid panel.

## 2015-04-24 NOTE — Assessment & Plan Note (Signed)
Breathing stable.  Continue current inhaler regimen.

## 2015-04-24 NOTE — Assessment & Plan Note (Signed)
Bowels stable.  No abdominal pain or cramping.

## 2015-05-20 ENCOUNTER — Other Ambulatory Visit: Payer: PRIVATE HEALTH INSURANCE

## 2015-06-20 ENCOUNTER — Ambulatory Visit: Payer: PRIVATE HEALTH INSURANCE | Admitting: Internal Medicine

## 2015-07-06 ENCOUNTER — Encounter: Payer: Self-pay | Admitting: Internal Medicine

## 2015-07-06 ENCOUNTER — Telehealth: Payer: Self-pay | Admitting: Internal Medicine

## 2015-07-06 ENCOUNTER — Ambulatory Visit (INDEPENDENT_AMBULATORY_CARE_PROVIDER_SITE_OTHER): Payer: PRIVATE HEALTH INSURANCE | Admitting: Internal Medicine

## 2015-07-06 VITALS — BP 128/67 | HR 90 | Temp 97.4°F | Resp 18 | Ht 65.0 in | Wt 222.0 lb

## 2015-07-06 DIAGNOSIS — R350 Frequency of micturition: Secondary | ICD-10-CM | POA: Diagnosis not present

## 2015-07-06 LAB — POCT URINALYSIS DIPSTICK
Bilirubin, UA: NEGATIVE
Blood, UA: NEGATIVE
Glucose, UA: 2000
KETONES UA: NEGATIVE
Leukocytes, UA: NEGATIVE
Nitrite, UA: NEGATIVE
PH UA: 5
PROTEIN UA: NEGATIVE
Spec Grav, UA: 1.02
Urobilinogen, UA: 0.2

## 2015-07-06 MED ORDER — FLUCONAZOLE 150 MG PO TABS: 150.0000 mg | ORAL_TABLET | ORAL | Status: DC

## 2015-07-06 MED ORDER — CIPROFLOXACIN HCL 500 MG PO TABS: 500.0000 mg | ORAL_TABLET | Freq: Two times a day (BID) | ORAL | Status: DC

## 2015-07-06 NOTE — Assessment & Plan Note (Signed)
Recent symptoms of urinary frequency, urgency and dysuria. Urine dip normal today except for 3+glucose, likely result of Jardiance and recent increased blood sugars. Will send urine for culture. Start empiric Cipro. Add Diflucan 135m weekly x 3 given recent vaginal culture pos for yeast, and she did not treat. Follow up prn if symptoms are not improving.

## 2015-07-06 NOTE — Telephone Encounter (Signed)
Pt called about having a Possible UTI/freq to urinate/pain/ for a couple of days. I advised pt no avail appt to sch and offered pt to see another provider. Pt did not want to see anyone else and hung up. Call pt @ [phone] . Thank you!

## 2015-07-06 NOTE — Telephone Encounter (Signed)
Dr. Gilford Rile agreed to see pt at 11:45am today

## 2015-07-06 NOTE — Telephone Encounter (Signed)
Do you still need me to do something with this pt.  It looks like Dr Gilford Rile is going to see her.  If no, then ok to close note.  Let me know if any problems or if I need to do anything.

## 2015-07-06 NOTE — Patient Instructions (Signed)
Start Cipro twice daily.  Start Diflucan 174m weekly x 3 weeks.  Follow up if symptoms are not improving.

## 2015-07-06 NOTE — Progress Notes (Signed)
Subjective:    [name], female    DOB: 21-Sep-1955, 60 y.o.   MRN: [id number]  HPI  59YO female presents for acute visit.  Urinary frequency, urgency and burning x 3 days. No blood in urine. Some chills and subjective fever at home. Also feels some pressure over her lower abdomen. No hematuria. No flank pain. Notes burning which occurs at the end of urination. No itching. No vaginal discharge. Recently treated with Flagyl for BV and has intermittently used Diflucan for yeast, however not recently.  Recent A1c was 7%.  Wt Readings from Last 3 Encounters:  07/06/15 222 lb (100.699 kg)  04/19/15 226 lb (102.513 kg)  03/16/15 227 lb (102.967 kg)   BP Readings from Last 3 Encounters:  07/06/15 128/67  04/19/15 110/70  03/16/15 114/72    Past Medical History  Diagnosis Date  . Hypertension   . Diabetes mellitus ([place])   . Hypercholesterolemia   . Crohn's disease ([place])   . IBS (irritable bowel syndrome)   . Depression   . Nephrolithiasis     s/p lithotripsy [facility])  . COPD (chronic obstructive pulmonary disease) ([facility])    Family History  Problem Relation Age of Onset  . Heart disease Father   . COPD Mother   . Hypercholesterolemia Mother   . Emphysema Mother   . Hypertension Brother   . Emphysema      grandmother  . Breast cancer Neg Hx   . Colon cancer Neg Hx   . Emphysema Maternal Grandmother    Past Surgical History  Procedure Laterality Date  . Tonsillectomy  1966  . Laparoscopic supracervical hysterectomy  1985    left ovary not removed  . Lithotripsy      x7  . Laparoscopic cholecystectomy  2005    Dr Tamala Julian  . Polypectomy    . Shoulder surgery      right (pinning)  . Shoulder surgery      left (pinning)  . Breast biopsy  2013  . Abdominal hysterectomy  1985  . Appendectomy  1985   Social History   Social History  . Marital Status: Married    Spouse Name: N/A  . Number of Children: 2  . Years of Education: N/A     Occupational History  .  [employer]   Social History Main Topics  . Smoking status: Former Smoker    Quit date: 04/09/1994  . Smokeless tobacco: Never Used     Comment: QUIT IN 1990'S  . Alcohol Use: 0.0 oz/week    0 Standard drinks or equivalent per week     Comment: OCCASIONALLY  . Drug Use: No  . Sexual Activity: Not on file   Other Topics Concern  . Not on file   Social History Narrative    Review of Systems  Constitutional: Positive for fever and chills. Negative for fatigue.  Gastrointestinal: Negative for nausea, vomiting, abdominal pain, diarrhea, constipation and rectal pain.  Genitourinary: Positive for dysuria, urgency and frequency. Negative for hematuria, flank pain, decreased urine volume, vaginal bleeding, vaginal discharge, difficulty urinating, vaginal pain and pelvic pain.       Objective:    BP 128/67 mmHg  Pulse 90  Temp(Src) 97.4 F (36.3 C) (Oral)  Resp 18  Ht 5' 5"  (1.651 m)  Wt 222 lb (100.699 kg)  BMI 36.94 kg/m2  SpO2 94% Physical Exam  Constitutional: She is oriented to person, place, and time. She appears well-developed and well-nourished. No  distress.  HENT:  Head: Normocephalic and atraumatic.  Right Ear: External ear normal.  Left Ear: External ear normal.  Nose: Nose normal.  Mouth/Throat: Oropharynx is clear and moist. No oropharyngeal exudate.  Eyes: Conjunctivae are normal. Pupils are equal, round, and reactive to light. Right eye exhibits no discharge. Left eye exhibits no discharge. No scleral icterus.  Neck: Normal range of motion. Neck supple. No tracheal deviation present. No thyromegaly present.  Cardiovascular: Normal rate, regular rhythm, normal heart sounds and intact distal pulses.  Exam reveals no gallop and no friction rub.   No murmur heard. Pulmonary/Chest: Effort normal and breath sounds normal. No respiratory distress. She has no wheezes. She has no rales. She exhibits no tenderness.  Abdominal: There is  no tenderness (no CVA tenderness).  Musculoskeletal: Normal range of motion. She exhibits no edema or tenderness.  Lymphadenopathy:    She has no cervical adenopathy.  Neurological: She is alert and oriented to person, place, and time. No cranial nerve deficit. She exhibits normal muscle tone. Coordination normal.  Skin: Skin is warm and dry. No rash noted. She is not diaphoretic. No erythema. No pallor.  Psychiatric: She has a normal mood and affect. Her behavior is normal. Judgment and thought content normal.          Assessment & Plan:   Problem List Items Addressed This Visit      Unprioritized   Urinary frequency - Primary    Recent symptoms of urinary frequency, urgency and dysuria. Urine dip normal today except for 3+glucose, likely result of Jardiance and recent increased blood sugars. Will send urine for culture. Start empiric Cipro. Add Diflucan 157m weekly x 3 given recent vaginal culture pos for yeast, and she did not treat. Follow up prn if symptoms are not improving.      Relevant Medications   fluconazole (DIFLUCAN) 150 MG tablet   ciprofloxacin (CIPRO) 500 MG tablet   Other Relevant Orders   POCT Urinalysis Dipstick (Completed)   CULTURE, URINE COMPREHENSIVE       Return if symptoms worsen or fail to improve.  JRonette Deter MD Internal Medicine [facility]

## 2015-07-06 NOTE — Telephone Encounter (Signed)
Can you see pt for an acute appointment? Please advise, pt is not willing to see another provider b/c she had to have a pelvic exam. Please advise, thanks

## 2015-07-06 NOTE — Progress Notes (Signed)
Pre-visit discussion using our clinic review tool. No additional management support is needed unless otherwise documented below in the visit note.  

## 2015-07-06 NOTE — Telephone Encounter (Signed)
Called Amanda Wiley and Amanda Wiley

## 2015-07-08 LAB — CULTURE, URINE COMPREHENSIVE

## 2015-08-03 ENCOUNTER — Other Ambulatory Visit: Payer: Self-pay | Admitting: *Deleted

## 2015-08-03 ENCOUNTER — Telehealth: Payer: Self-pay | Admitting: Internal Medicine

## 2015-08-03 MED ORDER — AMLODIPINE BESYLATE 5 MG PO TABS: 5.0000 mg | ORAL_TABLET | Freq: Every day | ORAL | Status: DC

## 2015-08-03 MED ORDER — FUROSEMIDE 20 MG PO TABS: 20.0000 mg | ORAL_TABLET | Freq: Every day | ORAL | Status: DC

## 2015-08-03 NOTE — Telephone Encounter (Signed)
Rx's placed up front & pt notified. Pt also aware that she is due for an appt.

## 2015-08-03 NOTE — Telephone Encounter (Signed)
Rx's placed up front for pick up. Pt notified & also aware that she needs to set up a follow-up appt.

## 2015-08-03 NOTE — Telephone Encounter (Signed)
Tried to reach patient at work number, no available. Will try again later. I also do NOT see a note or a refill request in her chart from Monday 08/01/15.

## 2015-08-03 NOTE — Telephone Encounter (Signed)
Pt called in about faxing in a Rx on Monday 08/01/2015 for amLODipine (NORVASC) 5 MG tablet and furosemide (LASIX) 20 MG tablet. Pt would like to pick up Rx. Call pt @ [phone]. Thank you!

## 2015-08-24 ENCOUNTER — Ambulatory Visit
Admission: RE | Admit: 2015-08-24 | Discharge: 2015-08-24 | Disposition: A | Discharge status: Home / Self Care | Payer: PRIVATE HEALTH INSURANCE | Source: Ambulatory Visit | Attending: Internal Medicine | Admitting: Internal Medicine

## 2015-08-24 ENCOUNTER — Encounter: Payer: Self-pay | Admitting: Internal Medicine

## 2015-08-24 ENCOUNTER — Telehealth: Payer: Self-pay | Admitting: *Deleted

## 2015-08-24 ENCOUNTER — Ambulatory Visit (INDEPENDENT_AMBULATORY_CARE_PROVIDER_SITE_OTHER): Payer: PRIVATE HEALTH INSURANCE | Admitting: Internal Medicine

## 2015-08-24 VITALS — BP 124/70 | HR 98 | Ht 65.5 in | Wt 220.8 lb

## 2015-08-24 DIAGNOSIS — I517 Cardiomegaly: Secondary | ICD-10-CM | POA: Insufficient documentation

## 2015-08-24 DIAGNOSIS — J441 Chronic obstructive pulmonary disease with (acute) exacerbation: Secondary | ICD-10-CM | POA: Diagnosis not present

## 2015-08-24 DIAGNOSIS — J849 Interstitial pulmonary disease, unspecified: Secondary | ICD-10-CM | POA: Diagnosis not present

## 2015-08-24 MED ORDER — LEVOFLOXACIN 500 MG PO TABS: 500.0000 mg | ORAL_TABLET | Freq: Every day | ORAL | Status: DC

## 2015-08-24 MED ORDER — BENZONATATE 200 MG PO CAPS: 200.0000 mg | ORAL_CAPSULE | Freq: Two times a day (BID) | ORAL | Status: DC | PRN

## 2015-08-24 MED ORDER — PREDNISONE 10 MG PO TABS: ORAL_TABLET | ORAL | Status: DC

## 2015-08-24 MED ORDER — IPRATROPIUM-ALBUTEROL 0.5-2.5 (3) MG/3ML IN SOLN: RESPIRATORY_TRACT | Status: DC

## 2015-08-24 NOTE — Progress Notes (Signed)
Pre visit review using our clinic review tool, if applicable. No additional management support is needed unless otherwise documented below in the visit note. 

## 2015-08-24 NOTE — Assessment & Plan Note (Signed)
Symptoms and exam most consistent with COPD, however given focal rhonchi in right lung, will get CXR for evaluation of pneumonia. Start Prednisone taper and Levaquin. Continue prn Albuterol. Continue Advair. Use Tessalon as needed for cough. Follow up in 1 week and prn. Return precautions given.

## 2015-08-24 NOTE — Patient Instructions (Signed)
Chest xray today at [facility].  Start Levaquin daily and Prednisone taper.  Use Tessalon as needed for cough.  Follow up next week and sooner as needed.

## 2015-08-24 NOTE — Progress Notes (Signed)
Subjective:    [name], female    DOB: October 16, 1955, 60 y.o.   MRN: [id number]  HPI  60YO female presents for acute visit.  Cough - 3 weeks of cough. Took Z-pack and Prednisone taper that she had at home with no improved. Feels short of breath with wheezing. Using Advair and prn Albuterol several times per day with no improvement. Cough sometimes productive of mucous. No sick contacts. Some sweats and chills. No measured fever. Some chest pain on right side.  Wt Readings from Last 3 Encounters:  08/24/15 220 lb 12.8 oz (100.154 kg)  07/06/15 222 lb (100.699 kg)  04/19/15 226 lb (102.513 kg)   BP Readings from Last 3 Encounters:  08/24/15 124/70  07/06/15 128/67  04/19/15 110/70    Past Medical History  Diagnosis Date  . Hypertension   . Diabetes mellitus ([place])   . Hypercholesterolemia   . Crohn's disease ([place])   . IBS (irritable bowel syndrome)   . Depression   . Nephrolithiasis     s/p lithotripsy [facility])  . COPD (chronic obstructive pulmonary disease) ([facility])    Family History  Problem Relation Age of Onset  . Heart disease Father   . COPD Mother   . Hypercholesterolemia Mother   . Emphysema Mother   . Hypertension Brother   . Emphysema      grandmother  . Breast cancer Neg Hx   . Colon cancer Neg Hx   . Emphysema Maternal Grandmother    Past Surgical History  Procedure Laterality Date  . Tonsillectomy  1966  . Laparoscopic supracervical hysterectomy  1985    left ovary not removed  . Lithotripsy      x7  . Laparoscopic cholecystectomy  2005    Dr Tamala Julian  . Polypectomy    . Shoulder surgery      right (pinning)  . Shoulder surgery      left (pinning)  . Breast biopsy  2013  . Abdominal hysterectomy  1985  . Appendectomy  1985   Social History   Social History  . Marital Status: Married    Spouse Name: N/A  . Number of Children: 2  . Years of Education: N/A   Occupational History  .  [employer]    Social History Main Topics  . Smoking status: Former Smoker    Quit date: 04/09/1994  . Smokeless tobacco: Never Used     Comment: QUIT IN 1990'S  . Alcohol Use: 0.0 oz/week    0 Standard drinks or equivalent per week     Comment: OCCASIONALLY  . Drug Use: No  . Sexual Activity: Not Asked   Other Topics Concern  . None   Social History Narrative    Review of Systems  Constitutional: Positive for fatigue. Negative for fever, chills and unexpected weight change.  HENT: Negative for congestion, ear discharge, ear pain, facial swelling, hearing loss, mouth sores, nosebleeds, postnasal drip, rhinorrhea, sinus pressure, sneezing, sore throat, tinnitus, trouble swallowing and voice change.   Eyes: Negative for pain, discharge, redness and visual disturbance.  Respiratory: Positive for cough, chest tightness, shortness of breath and wheezing. Negative for stridor.   Cardiovascular: Positive for chest pain. Negative for palpitations and leg swelling.  Musculoskeletal: Negative for myalgias, arthralgias, neck pain and neck stiffness.  Skin: Negative for color change and rash.  Neurological: Negative for dizziness, weakness, light-headedness and headaches.  Hematological: Negative for adenopathy.  Psychiatric/Behavioral: Positive for sleep disturbance.  Objective:    BP 124/70 mmHg  Pulse 98  Ht 5' 5.5" (1.664 m)  Wt 220 lb 12.8 oz (100.154 kg)  BMI 36.17 kg/m2  SpO2 94% Physical Exam  Constitutional: She is oriented to person, place, and time. She appears well-developed and well-nourished. No distress.  HENT:  Head: Normocephalic and atraumatic.  Right Ear: External ear normal.  Left Ear: External ear normal.  Nose: Nose normal.  Mouth/Throat: Oropharynx is clear and moist. No oropharyngeal exudate.  Eyes: Conjunctivae are normal. Pupils are equal, round, and reactive to light. Right eye exhibits no discharge. Left eye exhibits no discharge. No scleral icterus.  Neck:  Normal range of motion. Neck supple. No tracheal deviation present. No thyromegaly present.  Cardiovascular: Normal rate, regular rhythm, normal heart sounds and intact distal pulses.  Exam reveals no gallop and no friction rub.   No murmur heard. Pulmonary/Chest: Effort normal. No accessory muscle usage. No tachypnea. No respiratory distress. She has no decreased breath sounds. She has no wheezes. She has rhonchi in the right upper field, the right middle field and the right lower field. She has no rales. She exhibits no tenderness.  Musculoskeletal: Normal range of motion. She exhibits no edema or tenderness.  Lymphadenopathy:    She has no cervical adenopathy.  Neurological: She is alert and oriented to person, place, and time. No cranial nerve deficit. She exhibits normal muscle tone. Coordination normal.  Skin: Skin is warm and dry. No rash noted. She is not diaphoretic. No erythema. No pallor.  Psychiatric: She has a normal mood and affect. Her behavior is normal. Judgment and thought content normal.          Assessment & Plan:   Problem List Items Addressed This Visit      Unprioritized   COPD exacerbation ([place]) - Primary    Symptoms and exam most consistent with COPD, however given focal rhonchi in right lung, will get CXR for evaluation of pneumonia. Start Prednisone taper and Levaquin. Continue prn Albuterol. Continue Advair. Use Tessalon as needed for cough. Follow up in 1 week and prn. Return precautions given.      Relevant Medications   levofloxacin (LEVAQUIN) 500 MG tablet   predniSONE (DELTASONE) 10 MG tablet   benzonatate (TESSALON) 200 MG capsule   ipratropium-albuterol (DUONEB) 0.5-2.5 (3) MG/3ML SOLN   Other Relevant Orders   DG Chest 2 View       Return in about 1 week (around 08/31/2015) for Recheck.  Ronette Deter, MD Internal Medicine [facility]

## 2015-08-24 NOTE — Telephone Encounter (Addendum)
I will need a place to schedule patient for a one week 30 min follow up, she's a Dr. Nicki Reaper pt, however was seen by Dr. Gilford Rile.

## 2015-08-25 ENCOUNTER — Telehealth: Payer: Self-pay

## 2015-08-25 NOTE — Telephone Encounter (Signed)
Patient aware of chest xray results and recommendations.  She is feeling some better.

## 2015-08-25 NOTE — Telephone Encounter (Signed)
-----   Message from Jackolyn Confer, MD sent at 08/25/2015  9:25 AM EDT ----- Chest xray showed chronic lung disease but no acute process. Please have her continue antibiotics as prescribed

## 2015-09-01 ENCOUNTER — Encounter: Payer: Self-pay | Admitting: Internal Medicine

## 2015-09-01 ENCOUNTER — Ambulatory Visit (INDEPENDENT_AMBULATORY_CARE_PROVIDER_SITE_OTHER): Payer: PRIVATE HEALTH INSURANCE | Admitting: Internal Medicine

## 2015-09-01 VITALS — BP 100/60 | HR 79 | Temp 97.7°F | Resp 18 | Ht 65.5 in | Wt 221.5 lb

## 2015-09-01 DIAGNOSIS — E7800 Pure hypercholesterolemia, unspecified: Secondary | ICD-10-CM

## 2015-09-01 DIAGNOSIS — E669 Obesity, unspecified: Secondary | ICD-10-CM | POA: Diagnosis not present

## 2015-09-01 DIAGNOSIS — K5090 Crohn's disease, unspecified, without complications: Secondary | ICD-10-CM

## 2015-09-01 DIAGNOSIS — K219 Gastro-esophageal reflux disease without esophagitis: Secondary | ICD-10-CM

## 2015-09-01 DIAGNOSIS — I10 Essential (primary) hypertension: Secondary | ICD-10-CM | POA: Diagnosis not present

## 2015-09-01 DIAGNOSIS — J441 Chronic obstructive pulmonary disease with (acute) exacerbation: Secondary | ICD-10-CM

## 2015-09-01 DIAGNOSIS — F418 Other specified anxiety disorders: Secondary | ICD-10-CM

## 2015-09-01 DIAGNOSIS — F419 Anxiety disorder, unspecified: Secondary | ICD-10-CM

## 2015-09-01 DIAGNOSIS — F329 Major depressive disorder, single episode, unspecified: Secondary | ICD-10-CM

## 2015-09-01 DIAGNOSIS — E119 Type 2 diabetes mellitus without complications: Secondary | ICD-10-CM

## 2015-09-01 MED ORDER — PREDNISONE 10 MG PO TABS: ORAL_TABLET | ORAL | Status: DC

## 2015-09-01 MED ORDER — BUPROPION HCL ER (XL) 300 MG PO TB24: 300.0000 mg | ORAL_TABLET | Freq: Every day | ORAL | Status: DC

## 2015-09-01 MED ORDER — FLUTICASONE PROPIONATE 50 MCG/ACT NA SUSP: 2.0000 | Freq: Every day | NASAL | Status: DC

## 2015-09-01 NOTE — Progress Notes (Signed)
Pre-visit discussion using our clinic review tool. No additional management support is needed unless otherwise documented below in the visit note.  

## 2015-09-01 NOTE — Progress Notes (Signed)
[name], female   DOB: January 20, 1956, 60 y.o.   MRN: [id number]   Subjective:    [name], female    DOB: 1955/08/27, 59 y.o.   MRN: [id number]  HPI  Patient here for a scheduled follow up.  She states she overall has been doing well.  Was recently evaluated by Dr Gilford Rile on 08/24/15.  See note.  Diagnosed with COPD exacerbation.  Treated with levaquin and prednisone taper.  She is better, but still with increased cough.  Off prednisone.  Still a little tight in her chest.  She is using her inhalers.  No nausea or vomiting.  Bowels moving.  No abdominal pain or cramping.  Mother recently passed away. Overall she feels she is handling things relatively well.     Past Medical History  Diagnosis Date  . Hypertension   . Diabetes mellitus ([place])   . Hypercholesterolemia   . Crohn's disease ([place])   . IBS (irritable bowel syndrome)   . Depression   . Nephrolithiasis     s/p lithotripsy [facility])  . COPD (chronic obstructive pulmonary disease) [facility])    Past Surgical History  Procedure Laterality Date  . Tonsillectomy  1966  . Laparoscopic supracervical hysterectomy  1985    left ovary not removed  . Lithotripsy      x7  . Laparoscopic cholecystectomy  2005    Dr Tamala Julian  . Polypectomy    . Shoulder surgery      right (pinning)  . Shoulder surgery      left (pinning)  . Breast biopsy  2013  . Abdominal hysterectomy  1985  . Appendectomy  1985   Family History  Problem Relation Age of Onset  . Heart disease Father   . COPD Mother   . Hypercholesterolemia Mother   . Emphysema Mother   . Hypertension Brother   . Emphysema      grandmother  . Breast cancer Neg Hx   . Colon cancer Neg Hx   . Emphysema Maternal Grandmother    Social History   Social History  . Marital Status: Married    Spouse Name: N/A  . Number of Children: 2  . Years of Education: N/A   Occupational History  .  [employer]   Social History Main  Topics  . Smoking status: Former Smoker    Quit date: 04/09/1994  . Smokeless tobacco: Never Used     Comment: QUIT IN 1990'S  . Alcohol Use: 0.0 oz/week    0 Standard drinks or equivalent per week     Comment: OCCASIONALLY  . Drug Use: No  . Sexual Activity: Not Asked   Other Topics Concern  . None   Social History Narrative    Outpatient Encounter Prescriptions as of 09/01/2015  Medication Sig  . Albiglutide (TANZEUM) 30 MG PEN Inject 50 mg into the skin. Take every Tuesday  . albuterol (PROVENTIL HFA;VENTOLIN HFA) 108 (90 BASE) MCG/ACT inhaler Inhale 2 puffs into the lungs every 6 (six) hours as needed.  Marland Kitchen amLODipine (NORVASC) 5 MG tablet Take 1 tablet (5 mg total) by mouth daily.  . benzonatate (TESSALON) 200 MG capsule Take 1 capsule (200 mg total) by mouth 2 (two) times daily as needed for cough.  . empagliflozin (JARDIANCE) 25 MG TABS tablet Take 25 mg by mouth daily.  Marland Kitchen esomeprazole (NEXIUM) 40 MG capsule Take 1 capsule (40 mg total) by mouth 2 (two) times daily.  . fluticasone (FLONASE)  50 MCG/ACT nasal spray Place 2 sprays into the nose daily. Reported on 04/19/2015  . fluticasone-salmeterol (ADVAIR HFA) 230-21 MCG/ACT inhaler Inhale 2 puffs into the lungs 2 (two) times daily.  . furosemide (LASIX) 20 MG tablet Take 1 tablet (20 mg total) by mouth daily.  . hyoscyamine (LEVSIN SL) 0.125 MG SL tablet Place 0.125 mg under the tongue every 6 (six) hours as needed.  Marland Kitchen ipratropium-albuterol (DUONEB) 0.5-2.5 (3) MG/3ML SOLN TAKE 3 MLS BY NEBULIZATION EVERY 8 (EIGHT) HOURS AS NEEDED.  Marland Kitchen losartan (COZAAR) 100 MG tablet Take 1 tablet (100 mg total) by mouth daily.  . metFORMIN (GLUCOPHAGE) 500 MG tablet 2 tablets bid  . metoprolol succinate (TOPROL XL) 50 MG 24 hr tablet Take 1 tablet (50 mg total) by mouth daily. Take with or immediately following a meal.  . montelukast (SINGULAIR) 10 MG tablet Take 1 tablet (10 mg total) by mouth daily.  . Vitamin D, Ergocalciferol, (DRISDOL) 50000  UNITS CAPS capsule TAKE 1 CAPSULE (50,000 UNITS TOTAL) BY MOUTH EVERY 7 (SEVEN) DAYS.  . [DISCONTINUED] buPROPion (WELLBUTRIN XL) 150 MG 24 hr tablet Take 1 tablet (150 mg total) by mouth daily.  . [DISCONTINUED] levofloxacin (LEVAQUIN) 500 MG tablet Take 1 tablet (500 mg total) by mouth daily.  . [DISCONTINUED] predniSONE (DELTASONE) 10 MG tablet Take 51m by mouth on day 1, then taper by 149mdaily until gone  . buPROPion (WELLBUTRIN XL) 300 MG 24 hr tablet Take 1 tablet (300 mg total) by mouth daily.  . fluticasone (FLONASE) 50 MCG/ACT nasal spray Place 2 sprays into both nostrils daily.  . predniSONE (DELTASONE) 10 MG tablet Take 6 tablets x 1 day and then decrease by 1/2 tablet per day until down to zero mg.   No facility-administered encounter medications on file as of 09/01/2015.    Review of Systems  Constitutional: Negative for appetite change and unexpected weight change.  HENT: Positive for congestion. Negative for sinus pressure.   Respiratory: Positive for cough and chest tightness. Negative for shortness of breath.   Cardiovascular: Negative for chest pain, palpitations and leg swelling.  Gastrointestinal: Negative for nausea, vomiting, abdominal pain and diarrhea.  Genitourinary: Negative for dysuria and difficulty urinating.  Musculoskeletal: Negative for back pain and joint swelling.  Skin: Negative for color change and rash.  Neurological: Negative for dizziness, light-headedness and headaches.  Psychiatric/Behavioral: Negative for dysphoric mood and agitation.       Objective:    Physical Exam  Constitutional: She appears well-developed and well-nourished. No distress.  HENT:  Nose: Nose normal.  Mouth/Throat: Oropharynx is clear and moist.  Neck: Neck supple. No thyromegaly present.  Cardiovascular: Normal rate and regular rhythm.   Pulmonary/Chest: Breath sounds normal. No respiratory distress. She has no wheezes.  Increased cough with expiration.    Abdominal:  Soft. Bowel sounds are normal. There is no tenderness.  Musculoskeletal: She exhibits no edema or tenderness.  Lymphadenopathy:    She has no cervical adenopathy.  Skin: No rash noted. No erythema.  Psychiatric: She has a normal mood and affect. Her behavior is normal.    BP 100/60 mmHg  Pulse 79  Temp(Src) 97.7 F (36.5 C) (Oral)  Resp 18  Ht 5' 5.5" (1.664 m)  Wt 221 lb 8 oz (100.472 kg)  BMI 36.29 kg/m2  SpO2 95% Wt Readings from Last 3 Encounters:  09/01/15 221 lb 8 oz (100.472 kg)  08/24/15 220 lb 12.8 oz (100.154 kg)  07/06/15 222 lb (100.699 kg)  Lab Results  Component Value Date   WBC 7.9 12/23/2014   HGB 15.5* 12/23/2014   HCT 46.0 12/23/2014   PLT 179.0 12/23/2014   GLUCOSE 112* 12/23/2014   CHOL 185 12/23/2014   TRIG 320.0* 12/23/2014   HDL 43.30 12/23/2014   LDLDIRECT 109.0 12/23/2014   LDLCALC 148* 07/24/2013   ALT 21 12/23/2014   AST 15 12/23/2014   NA 142 12/23/2014   K 4.4 12/23/2014   CL 105 12/23/2014   CREATININE 0.88 12/23/2014   BUN 18 12/23/2014   CO2 24 12/23/2014   TSH 1.53 12/23/2014   HGBA1C 6.6* 12/23/2014   MICROALBUR <0.7 12/23/2014    Dg Chest 2 View  08/25/2015  CLINICAL DATA:  Cough.  Chest pain.  COPD. EXAM: CHEST  2 VIEW COMPARISON:  04/20/2015. FINDINGS: Mediastinum hilar structures normal. No focal infiltrate. Chronic interstitial lung disease noted. No pleural effusion or pneumothorax. Heart size normal. No acute bony abnormality. Right shoulder replacement. IMPRESSION: 1.  Chronic interstitial lung disease. 2. No acute cardiopulmonary disease.  Stable cardiomegaly . Electronically Signed   By: Marcello Moores  Register   On: 08/25/2015 09:19       Assessment & Plan:   Problem List Items Addressed This Visit    Anxiety and depression    Overall she feels she is handling things relatively well.        COPD exacerbation ([place])    Doing better.  cxr reviewed.  Still with tightness and increased cough.  Do not feel further abx  warranted.  Treat with prednisone taper.  Discussed side effects.  Follow sugar.        Relevant Medications   fluticasone (FLONASE) 50 MCG/ACT nasal spray   predniSONE (DELTASONE) 10 MG tablet   Crohn's disease ([facility])    Bowels stable.  No abdominal pain.  Follow.        Diabetes mellitus ([place])    Seeing Dr Eddie Dibbles.  Sugars had been doing well.  Increased recently on prednisone.  Discussed with her today.  Giving her another round of prednisone.  Will need to follow sugars.  Stay hydrated.        GERD (gastroesophageal reflux disease)    Controlled on nexium.       Hypercholesterolemia    Low cholesterol diet and exercise.  Follow lipid panel.        Hypertension    Blood pressure under good control.  Continue same medication regimen.  Follow pressures.  Follow metabolic panel.        Obesity (BMI 30-39.9) - Primary    Discussed diet and exercise.            Einar Pheasant, MD

## 2015-09-02 ENCOUNTER — Encounter: Payer: Self-pay | Admitting: Internal Medicine

## 2015-09-02 NOTE — Assessment & Plan Note (Signed)
Doing better.  cxr reviewed.  Still with tightness and increased cough.  Do not feel further abx warranted.  Treat with prednisone taper.  Discussed side effects.  Follow sugar.

## 2015-09-02 NOTE — Assessment & Plan Note (Signed)
Overall she feels she is handling things relatively well.

## 2015-09-02 NOTE — Assessment & Plan Note (Signed)
Bowels stable.  No abdominal pain.  Follow.

## 2015-09-02 NOTE — Assessment & Plan Note (Signed)
Seeing Dr Eddie Dibbles.  Sugars had been doing well.  Increased recently on prednisone.  Discussed with her today.  Giving her another round of prednisone.  Will need to follow sugars.  Stay hydrated.

## 2015-09-02 NOTE — Assessment & Plan Note (Signed)
Controlled on nexium.

## 2015-09-02 NOTE — Assessment & Plan Note (Signed)
Blood pressure under good control.  Continue same medication regimen.  Follow pressures.  Follow metabolic panel.   

## 2015-09-02 NOTE — Assessment & Plan Note (Signed)
Discussed diet and exercise 

## 2015-09-02 NOTE — Assessment & Plan Note (Signed)
Low cholesterol diet and exercise.  Follow lipid panel.

## 2015-09-03 NOTE — Addendum Note (Signed)
Addended by: Alisa Graff on: 09/03/2015 03:10 PM   Modules accepted: Orders

## 2015-09-06 ENCOUNTER — Telehealth: Payer: Self-pay | Admitting: *Deleted

## 2015-09-06 NOTE — Telephone Encounter (Signed)
   Blood sugar was over 300 over the weekend.  She had called Dr. Sammuel Hines office on Friday am and was under the idea that she was calling in something to help her Blood sugar since she is on all the prednisone and Friday came and went and she got no response and then she called them again yesterday with no response, she got concerned today and called [place] also.  She just got off the phone with them and they have called in a Novolog pen to cover her.  I told her in the future if she needs assistance to call us and we can help with short term for her needs., but that Dr. Sammuel Hines office was her endocrinologist.  She agreed. thanks

## 2015-09-06 NOTE — Telephone Encounter (Signed)
Patient has requested a call, for consult with follow up questions in regards to blood sugars.

## 2015-09-08 ENCOUNTER — Encounter: Payer: Self-pay | Admitting: Internal Medicine

## 2015-09-12 ENCOUNTER — Encounter: Payer: Self-pay | Admitting: Surgical

## 2015-09-12 ENCOUNTER — Ambulatory Visit (INDEPENDENT_AMBULATORY_CARE_PROVIDER_SITE_OTHER): Payer: PRIVATE HEALTH INSURANCE | Admitting: Internal Medicine

## 2015-09-12 ENCOUNTER — Encounter: Payer: Self-pay | Admitting: Internal Medicine

## 2015-09-12 ENCOUNTER — Ambulatory Visit: Payer: PRIVATE HEALTH INSURANCE | Admitting: Internal Medicine

## 2015-09-12 ENCOUNTER — Telehealth: Payer: Self-pay | Admitting: Internal Medicine

## 2015-09-12 VITALS — BP 114/62 | HR 75 | Temp 98.0°F | Ht 65.5 in | Wt 218.8 lb

## 2015-09-12 DIAGNOSIS — J4520 Mild intermittent asthma, uncomplicated: Secondary | ICD-10-CM

## 2015-09-12 DIAGNOSIS — F419 Anxiety disorder, unspecified: Secondary | ICD-10-CM

## 2015-09-12 MED ORDER — ALPRAZOLAM 0.25 MG PO TABS: 0.2500 mg | ORAL_TABLET | Freq: Two times a day (BID) | ORAL | Status: DC | PRN

## 2015-09-12 NOTE — Telephone Encounter (Signed)
Please advise, next OV is in Sept, you did see her recently in May. Thanks

## 2015-09-12 NOTE — Progress Notes (Signed)
Pre visit review using our clinic review tool, if applicable. No additional management support is needed unless otherwise documented below in the visit note. 

## 2015-09-12 NOTE — Telephone Encounter (Signed)
I can see her this pm..  Work in for this.  Have her come in before 5:00 today.  Thanks

## 2015-09-12 NOTE — Progress Notes (Signed)
[name], female   DOB: Nov 28, 1955, 60 y.o.   MRN: [id number]   Subjective:    [name], female    DOB: 1955-08-20, 60 y.o.   MRN: [id number]  HPI  Patient here as a work in with concerns regarding increased stress.  Crying.  Her brother recently committed suicide.  She is having a hard time with this.  She is accompanied by her husband.  History obtained from both of them.  She has good support.  Having a hard time relaxing.  Feels she needs something to have to take as needed - to help with anxiety and stress.  No suicidal ideations.  States her breathing is doing better.  She is eating.  No vomiting.     Past Medical History  Diagnosis Date  . Hypertension   . Diabetes mellitus ([place])   . Hypercholesterolemia   . Crohn's disease ([place])   . IBS (irritable bowel syndrome)   . Depression   . Nephrolithiasis     s/p lithotripsy [facility])  . COPD (chronic obstructive pulmonary disease) [facility])    Past Surgical History  Procedure Laterality Date  . Tonsillectomy  1966  . Laparoscopic supracervical hysterectomy  1985    left ovary not removed  . Lithotripsy      x7  . Laparoscopic cholecystectomy  2005    Dr Tamala Julian  . Polypectomy    . Shoulder surgery      right (pinning)  . Shoulder surgery      left (pinning)  . Breast biopsy  2013  . Abdominal hysterectomy  1985  . Appendectomy  1985   Family History  Problem Relation Age of Onset  . Heart disease Father   . COPD Mother   . Hypercholesterolemia Mother   . Emphysema Mother   . Hypertension Brother   . Emphysema      grandmother  . Breast cancer Neg Hx   . Colon cancer Neg Hx   . Emphysema Maternal Grandmother    Social History   Social History  . Marital Status: Married    Spouse Name: N/A  . Number of Children: 2  . Years of Education: N/A   Occupational History  .  [employer]   Social History Main Topics  . Smoking status: Former Smoker    Quit date:  04/09/1994  . Smokeless tobacco: Never Used     Comment: QUIT IN 1990'S  . Alcohol Use: 0.0 oz/week    0 Standard drinks or equivalent per week     Comment: OCCASIONALLY  . Drug Use: No  . Sexual Activity: Not Asked   Other Topics Concern  . None   Social History Narrative    Outpatient Encounter Prescriptions as of 09/12/2015  Medication Sig  . Albiglutide (TANZEUM) 30 MG PEN Inject 50 mg into the skin. Take every Tuesday  . albuterol (PROVENTIL HFA;VENTOLIN HFA) 108 (90 BASE) MCG/ACT inhaler Inhale 2 puffs into the lungs every 6 (six) hours as needed.  Marland Kitchen amLODipine (NORVASC) 5 MG tablet Take 1 tablet (5 mg total) by mouth daily.  Marland Kitchen buPROPion (WELLBUTRIN XL) 300 MG 24 hr tablet Take 1 tablet (300 mg total) by mouth daily.  . empagliflozin (JARDIANCE) 25 MG TABS tablet Take 25 mg by mouth daily.  Marland Kitchen esomeprazole (NEXIUM) 40 MG capsule Take 1 capsule (40 mg total) by mouth 2 (two) times daily.  . fluticasone (FLONASE) 50 MCG/ACT nasal spray Place 2 sprays into the  nose daily. Reported on 04/19/2015  . fluticasone (FLONASE) 50 MCG/ACT nasal spray Place 2 sprays into both nostrils daily.  . fluticasone-salmeterol (ADVAIR HFA) 230-21 MCG/ACT inhaler Inhale 2 puffs into the lungs 2 (two) times daily.  . furosemide (LASIX) 20 MG tablet Take 1 tablet (20 mg total) by mouth daily.  . hyoscyamine (LEVSIN SL) 0.125 MG SL tablet Place 0.125 mg under the tongue every 6 (six) hours as needed.  Marland Kitchen ipratropium-albuterol (DUONEB) 0.5-2.5 (3) MG/3ML SOLN TAKE 3 MLS BY NEBULIZATION EVERY 8 (EIGHT) HOURS AS NEEDED.  Marland Kitchen losartan (COZAAR) 100 MG tablet Take 1 tablet (100 mg total) by mouth daily.  . metFORMIN (GLUCOPHAGE) 500 MG tablet 2 tablets bid  . metoprolol succinate (TOPROL XL) 50 MG 24 hr tablet Take 1 tablet (50 mg total) by mouth daily. Take with or immediately following a meal.  . montelukast (SINGULAIR) 10 MG tablet Take 1 tablet (10 mg total) by mouth daily.  . Vitamin D, Ergocalciferol, (DRISDOL)  50000 UNITS CAPS capsule TAKE 1 CAPSULE (50,000 UNITS TOTAL) BY MOUTH EVERY 7 (SEVEN) DAYS.  Marland Kitchen ALPRAZolam (XANAX) 0.25 MG tablet Take 1 tablet (0.25 mg total) by mouth 2 (two) times daily as needed for anxiety.  . benzonatate (TESSALON) 200 MG capsule Take 1 capsule (200 mg total) by mouth 2 (two) times daily as needed for cough. (Patient not taking: Reported on 09/12/2015)  . predniSONE (DELTASONE) 10 MG tablet Take 6 tablets x 1 day and then decrease by 1/2 tablet per day until down to zero mg. (Patient not taking: Reported on 09/12/2015)   No facility-administered encounter medications on file as of 09/12/2015.    Review of Systems  HENT: Negative for congestion and sinus pressure.   Respiratory: Negative for cough, chest tightness and shortness of breath.   Cardiovascular: Negative for palpitations and leg swelling.  Gastrointestinal: Negative for nausea and vomiting.  Genitourinary: Negative for dysuria and difficulty urinating.  Neurological: Negative for dizziness, light-headedness and headaches.  Psychiatric/Behavioral: Negative for agitation.       Increased stress and anxiety as outlined.         Objective:    Physical Exam  Constitutional: She appears well-developed and well-nourished. No distress.  Neck: Neck supple.  Cardiovascular: Normal rate and regular rhythm.   Pulmonary/Chest: Breath sounds normal. No respiratory distress. She has no wheezes.  Lymphadenopathy:    She has no cervical adenopathy.  Psychiatric: Her behavior is normal.  Crying throughout the visit.     BP 114/62 mmHg  Pulse 75  Temp(Src) 98 F (36.7 C) (Oral)  Ht 5' 5.5" (1.664 m)  Wt 218 lb 12.8 oz (99.247 kg)  BMI 35.84 kg/m2  SpO2 96% Wt Readings from Last 3 Encounters:  09/12/15 218 lb 12.8 oz (99.247 kg)  09/01/15 221 lb 8 oz (100.472 kg)  08/24/15 220 lb 12.8 oz (100.154 kg)     Lab Results  Component Value Date   WBC 7.9 12/23/2014   HGB 15.5* 12/23/2014   HCT 46.0 12/23/2014   PLT  179.0 12/23/2014   GLUCOSE 112* 12/23/2014   CHOL 185 12/23/2014   TRIG 320.0* 12/23/2014   HDL 43.30 12/23/2014   LDLDIRECT 109.0 12/23/2014   LDLCALC 148* 07/24/2013   ALT 21 12/23/2014   AST 15 12/23/2014   NA 142 12/23/2014   K 4.4 12/23/2014   CL 105 12/23/2014   CREATININE 0.88 12/23/2014   BUN 18 12/23/2014   CO2 24 12/23/2014   TSH 1.53 12/23/2014  HGBA1C 6.6* 12/23/2014   MICROALBUR <0.7 12/23/2014    Dg Chest 2 View  08/25/2015  CLINICAL DATA:  Cough.  Chest pain.  COPD. EXAM: CHEST  2 VIEW COMPARISON:  04/20/2015. FINDINGS: Mediastinum hilar structures normal. No focal infiltrate. Chronic interstitial lung disease noted. No pleural effusion or pneumothorax. Heart size normal. No acute bony abnormality. Right shoulder replacement. IMPRESSION: 1.  Chronic interstitial lung disease. 2. No acute cardiopulmonary disease.  Stable cardiomegaly . Electronically Signed   By: Marcello Moores  Register   On: 08/25/2015 09:19       Assessment & Plan:   Problem List Items Addressed This Visit    Anxiety    Discussed with her today.  Discussed treatment options.  Discussed counseling.  She declines counseling at this time.  Feels she needs something to have prn.  Xanax as directed.  Follow closely.  Keep me posted.        Relevant Medications   ALPRAZolam (XANAX) 0.25 MG tablet   Asthma - Primary    Breathing doing better.  Follow.           Einar Pheasant, MD

## 2015-09-12 NOTE — Telephone Encounter (Signed)
Pt called (hysterical) stating that her bother passed away and is having a very hard time deal with his death and would like something to help.Marland Kitchen Please advise..   Thanks

## 2015-09-15 ENCOUNTER — Other Ambulatory Visit: Payer: PRIVATE HEALTH INSURANCE

## 2015-09-18 ENCOUNTER — Encounter: Payer: Self-pay | Admitting: Internal Medicine

## 2015-09-18 DIAGNOSIS — F419 Anxiety disorder, unspecified: Secondary | ICD-10-CM | POA: Insufficient documentation

## 2015-09-18 NOTE — Assessment & Plan Note (Signed)
Discussed with her today.  Discussed treatment options.  Discussed counseling.  She declines counseling at this time.  Feels she needs something to have prn.  Xanax as directed.  Follow closely.  Keep me posted.

## 2015-09-18 NOTE — Assessment & Plan Note (Signed)
Breathing doing better.  Follow.

## 2015-09-23 ENCOUNTER — Other Ambulatory Visit: Payer: PRIVATE HEALTH INSURANCE

## 2015-10-01 ENCOUNTER — Other Ambulatory Visit: Payer: Self-pay | Admitting: Internal Medicine

## 2015-10-12 ENCOUNTER — Encounter: Payer: Self-pay | Admitting: Internal Medicine

## 2015-10-12 ENCOUNTER — Ambulatory Visit (INDEPENDENT_AMBULATORY_CARE_PROVIDER_SITE_OTHER): Payer: PRIVATE HEALTH INSURANCE | Admitting: Internal Medicine

## 2015-10-12 VITALS — BP 120/70 | HR 79 | Temp 98.1°F | Resp 18 | Ht 65.5 in | Wt 216.0 lb

## 2015-10-12 DIAGNOSIS — E669 Obesity, unspecified: Secondary | ICD-10-CM

## 2015-10-12 DIAGNOSIS — E7800 Pure hypercholesterolemia, unspecified: Secondary | ICD-10-CM

## 2015-10-12 DIAGNOSIS — J4520 Mild intermittent asthma, uncomplicated: Secondary | ICD-10-CM

## 2015-10-12 DIAGNOSIS — F419 Anxiety disorder, unspecified: Secondary | ICD-10-CM

## 2015-10-12 DIAGNOSIS — F418 Other specified anxiety disorders: Secondary | ICD-10-CM

## 2015-10-12 DIAGNOSIS — K5090 Crohn's disease, unspecified, without complications: Secondary | ICD-10-CM

## 2015-10-12 DIAGNOSIS — I10 Essential (primary) hypertension: Secondary | ICD-10-CM

## 2015-10-12 DIAGNOSIS — E119 Type 2 diabetes mellitus without complications: Secondary | ICD-10-CM

## 2015-10-12 DIAGNOSIS — F329 Major depressive disorder, single episode, unspecified: Secondary | ICD-10-CM

## 2015-10-12 MED ORDER — ALPRAZOLAM 0.25 MG PO TABS: 0.2500 mg | ORAL_TABLET | Freq: Two times a day (BID) | ORAL | Status: DC | PRN

## 2015-10-12 NOTE — Progress Notes (Signed)
[name], female   DOB: Aug 13, 1955, 60 y.o.   MRN: [id number]   Subjective:    [name], female    DOB: 1956-02-06, 60 y.o.   MRN: [id number]  HPI  Patient here for a scheduled follow up.  She has been dealing with increased stress.  Trying to cope with her brother's recent unexpected death.  Mother also recently passed away.  She is taking wellbutrin.  Has xanax if needed.  Does feel better.  Not sleeping well.  Wakes up after 4-5 hours of sleep.  She does not feel needs anything more at this time.  Breathing overall stable.  She has noticed some wheezing in the last 24 hours, but no significant cough or congestion.  She is eating.  Blood sugars doing well.  States averaging 120-134.  No abdominal pain or cramping.  Bowels stable.     Past Medical History  Diagnosis Date  . Hypertension   . Diabetes mellitus ([place])   . Hypercholesterolemia   . Crohn's disease ([place])   . IBS (irritable bowel syndrome)   . Depression   . Nephrolithiasis     s/p lithotripsy [facility])  . COPD (chronic obstructive pulmonary disease) [facility])    Past Surgical History  Procedure Laterality Date  . Tonsillectomy  1966  . Laparoscopic supracervical hysterectomy  1985    left ovary not removed  . Lithotripsy      x7  . Laparoscopic cholecystectomy  2005    Dr Tamala Julian  . Polypectomy    . Shoulder surgery      right (pinning)  . Shoulder surgery      left (pinning)  . Breast biopsy  2013  . Abdominal hysterectomy  1985  . Appendectomy  1985   Family History  Problem Relation Age of Onset  . Heart disease Father   . COPD Mother   . Hypercholesterolemia Mother   . Emphysema Mother   . Hypertension Brother   . Emphysema      grandmother  . Breast cancer Neg Hx   . Colon cancer Neg Hx   . Emphysema Maternal Grandmother    Social History   Social History  . Marital Status: Married    Spouse Name: N/A  . Number of Children: 2  . Years of Education:  N/A   Occupational History  .  [employer]   Social History Main Topics  . Smoking status: Former Smoker    Quit date: 04/09/1994  . Smokeless tobacco: Never Used     Comment: QUIT IN 1990'S  . Alcohol Use: 0.0 oz/week    0 Standard drinks or equivalent per week     Comment: OCCASIONALLY  . Drug Use: No  . Sexual Activity: Not Asked   Other Topics Concern  . None   Social History Narrative    Outpatient Encounter Prescriptions as of 10/12/2015  Medication Sig  . Albiglutide (TANZEUM) 30 MG PEN Inject 50 mg into the skin. Take every Tuesday  . albuterol (PROVENTIL HFA;VENTOLIN HFA) 108 (90 BASE) MCG/ACT inhaler Inhale 2 puffs into the lungs every 6 (six) hours as needed.  . ALPRAZolam (XANAX) 0.25 MG tablet Take 1 tablet (0.25 mg total) by mouth 2 (two) times daily as needed for anxiety.  Marland Kitchen amLODipine (NORVASC) 5 MG tablet Take 1 tablet (5 mg total) by mouth daily.  . benzonatate (TESSALON) 200 MG capsule Take 1 capsule (200 mg total) by mouth 2 (two) times daily as  needed for cough.  Marland Kitchen buPROPion (WELLBUTRIN XL) 300 MG 24 hr tablet take 1 tablet by mouth once daily  . empagliflozin (JARDIANCE) 25 MG TABS tablet Take 25 mg by mouth daily.  Marland Kitchen esomeprazole (NEXIUM) 40 MG capsule Take 1 capsule (40 mg total) by mouth 2 (two) times daily.  . fluticasone (FLONASE) 50 MCG/ACT nasal spray Place 2 sprays into both nostrils daily.  . fluticasone-salmeterol (ADVAIR HFA) 230-21 MCG/ACT inhaler Inhale 2 puffs into the lungs 2 (two) times daily.  . furosemide (LASIX) 20 MG tablet Take 1 tablet (20 mg total) by mouth daily.  . hyoscyamine (LEVSIN SL) 0.125 MG SL tablet Place 0.125 mg under the tongue every 6 (six) hours as needed.  Marland Kitchen ipratropium-albuterol (DUONEB) 0.5-2.5 (3) MG/3ML SOLN TAKE 3 MLS BY NEBULIZATION EVERY 8 (EIGHT) HOURS AS NEEDED.  Marland Kitchen losartan (COZAAR) 100 MG tablet Take 1 tablet (100 mg total) by mouth daily.  . metFORMIN (GLUCOPHAGE) 500 MG tablet 2 tablets bid  .  metoprolol succinate (TOPROL XL) 50 MG 24 hr tablet Take 1 tablet (50 mg total) by mouth daily. Take with or immediately following a meal.  . montelukast (SINGULAIR) 10 MG tablet Take 1 tablet (10 mg total) by mouth daily.  . Vitamin D, Ergocalciferol, (DRISDOL) 50000 UNITS CAPS capsule TAKE 1 CAPSULE (50,000 UNITS TOTAL) BY MOUTH EVERY 7 (SEVEN) DAYS.  . [DISCONTINUED] ALPRAZolam (XANAX) 0.25 MG tablet Take 1 tablet (0.25 mg total) by mouth 2 (two) times daily as needed for anxiety.  . [DISCONTINUED] fluticasone (FLONASE) 50 MCG/ACT nasal spray Place 2 sprays into the nose daily. Reported on 04/19/2015  . [DISCONTINUED] predniSONE (DELTASONE) 10 MG tablet Take 6 tablets x 1 day and then decrease by 1/2 tablet per day until down to zero mg. (Patient not taking: Reported on 09/12/2015)   No facility-administered encounter medications on file as of 10/12/2015.    Review of Systems  Constitutional: Negative for appetite change and unexpected weight change.       Some decreased appetite with the increased stress, but she is eating regularly.    HENT: Negative for congestion and sinus pressure.   Respiratory: Positive for wheezing. Negative for cough, chest tightness and shortness of breath.   Cardiovascular: Negative for chest pain, palpitations and leg swelling.  Gastrointestinal: Negative for nausea, vomiting, abdominal pain and diarrhea.  Genitourinary: Negative for dysuria and difficulty urinating.  Musculoskeletal: Negative for back pain and joint swelling.  Skin: Negative for color change and rash.  Neurological: Negative for dizziness, light-headedness and headaches.  Psychiatric/Behavioral: Negative for dysphoric mood and agitation.       Objective:    Physical Exam  Constitutional: She appears well-developed and well-nourished. No distress.  HENT:  Nose: Nose normal.  Mouth/Throat: Oropharynx is clear and moist.  Neck: Neck supple. No thyromegaly present.  Cardiovascular: Normal rate  and regular rhythm.   Pulmonary/Chest: Breath sounds normal. No respiratory distress. She has no wheezes.  Abdominal: Soft. Bowel sounds are normal. There is no tenderness.  Musculoskeletal: She exhibits no edema or tenderness.  Lymphadenopathy:    She has no cervical adenopathy.  Skin: No rash noted. No erythema.  Psychiatric: She has a normal mood and affect. Her behavior is normal.    BP 120/70 mmHg  Pulse 79  Temp(Src) 98.1 F (36.7 C) (Oral)  Resp 18  Ht 5' 5.5" (1.664 m)  Wt 216 lb (97.977 kg)  BMI 35.38 kg/m2  SpO2 94% Wt Readings from Last 3 Encounters:  10/12/15 216  lb (97.977 kg)  09/12/15 218 lb 12.8 oz (99.247 kg)  09/01/15 221 lb 8 oz (100.472 kg)     Lab Results  Component Value Date   WBC 7.9 12/23/2014   HGB 15.5* 12/23/2014   HCT 46.0 12/23/2014   PLT 179.0 12/23/2014   GLUCOSE 112* 12/23/2014   CHOL 185 12/23/2014   TRIG 320.0* 12/23/2014   HDL 43.30 12/23/2014   LDLDIRECT 109.0 12/23/2014   LDLCALC 148* 07/24/2013   ALT 21 12/23/2014   AST 15 12/23/2014   NA 142 12/23/2014   K 4.4 12/23/2014   CL 105 12/23/2014   CREATININE 0.88 12/23/2014   BUN 18 12/23/2014   CO2 24 12/23/2014   TSH 1.53 12/23/2014   HGBA1C 6.6* 12/23/2014   MICROALBUR <0.7 12/23/2014    Dg Chest 2 View  08/25/2015  CLINICAL DATA:  Cough.  Chest pain.  COPD. EXAM: CHEST  2 VIEW COMPARISON:  04/20/2015. FINDINGS: Mediastinum hilar structures normal. No focal infiltrate. Chronic interstitial lung disease noted. No pleural effusion or pneumothorax. Heart size normal. No acute bony abnormality. Right shoulder replacement. IMPRESSION: 1.  Chronic interstitial lung disease. 2. No acute cardiopulmonary disease.  Stable cardiomegaly . Electronically Signed   By: Marcello Moores  Register   On: 08/25/2015 09:19       Assessment & Plan:   Problem List Items Addressed This Visit    Anxiety and depression    Increased stress as outlined.  On wellbutrin.  Xanax if needed.  Discussed with her  today.  Does not feel needs anything more at this time.  Follow.        Asthma - Primary    Breathing overall stable.  Some wheezing recently, but lung exam ok.  Increased air movement.  Continue current inhaler use.  Rescue inhaler if needed.  Notify me if symptoms change or worsen.        Crohn's disease ([place])    Bowels stable.  No abdominal pain.  Follow.        Diabetes mellitus ([place])    Sugars as outlined.  Doing well.  Continue current medication regimen.  No low sugars.  Follow met b and a1c.        Hypercholesterolemia    Low cholesterol diet and exercise.  Overdue f/u labs.  Schedule.  Check lipid panel.        Hypertension    Blood pressure under good control.  Continue same medication regimen.  Follow pressures.  Follow metabolic panel.        Obesity (BMI 30-39.9)    Diet and exercise.            Einar Pheasant, MD

## 2015-10-12 NOTE — Progress Notes (Signed)
Pre-visit discussion using our clinic review tool. No additional management support is needed unless otherwise documented below in the visit note.  

## 2015-10-13 ENCOUNTER — Encounter: Payer: Self-pay | Admitting: Internal Medicine

## 2015-10-13 ENCOUNTER — Other Ambulatory Visit: Payer: PRIVATE HEALTH INSURANCE

## 2015-10-13 NOTE — Assessment & Plan Note (Signed)
Low cholesterol diet and exercise.  Overdue f/u labs.  Schedule.  Check lipid panel.

## 2015-10-13 NOTE — Assessment & Plan Note (Signed)
Increased stress as outlined.  On wellbutrin.  Xanax if needed.  Discussed with her today.  Does not feel needs anything more at this time.  Follow.

## 2015-10-13 NOTE — Assessment & Plan Note (Signed)
Diet and exercise.   

## 2015-10-13 NOTE — Assessment & Plan Note (Signed)
Bowels stable.  No abdominal pain.  Follow.

## 2015-10-13 NOTE — Assessment & Plan Note (Signed)
Breathing overall stable.  Some wheezing recently, but lung exam ok.  Increased air movement.  Continue current inhaler use.  Rescue inhaler if needed.  Notify me if symptoms change or worsen.

## 2015-10-13 NOTE — Assessment & Plan Note (Signed)
Sugars as outlined.  Doing well.  Continue current medication regimen.  No low sugars.  Follow met b and a1c.

## 2015-10-13 NOTE — Assessment & Plan Note (Signed)
Blood pressure under good control.  Continue same medication regimen.  Follow pressures.  Follow metabolic panel.   

## 2015-10-19 ENCOUNTER — Other Ambulatory Visit (INDEPENDENT_AMBULATORY_CARE_PROVIDER_SITE_OTHER): Payer: PRIVATE HEALTH INSURANCE

## 2015-10-19 ENCOUNTER — Other Ambulatory Visit: Payer: Self-pay | Admitting: Internal Medicine

## 2015-10-19 DIAGNOSIS — K5090 Crohn's disease, unspecified, without complications: Secondary | ICD-10-CM | POA: Diagnosis not present

## 2015-10-19 DIAGNOSIS — E7800 Pure hypercholesterolemia, unspecified: Secondary | ICD-10-CM

## 2015-10-19 DIAGNOSIS — I10 Essential (primary) hypertension: Secondary | ICD-10-CM | POA: Diagnosis not present

## 2015-10-19 DIAGNOSIS — E559 Vitamin D deficiency, unspecified: Secondary | ICD-10-CM | POA: Diagnosis not present

## 2015-10-19 DIAGNOSIS — R7989 Other specified abnormal findings of blood chemistry: Secondary | ICD-10-CM | POA: Diagnosis not present

## 2015-10-19 DIAGNOSIS — E119 Type 2 diabetes mellitus without complications: Secondary | ICD-10-CM | POA: Diagnosis not present

## 2015-10-19 DIAGNOSIS — D582 Other hemoglobinopathies: Secondary | ICD-10-CM

## 2015-10-19 LAB — CBC WITH DIFFERENTIAL/PLATELET
Basophils Absolute: 0.1 10*3/uL (ref 0.0–0.1)
Basophils Relative: 0.7 % (ref 0.0–3.0)
EOS PCT: 7.3 % — AB (ref 0.0–5.0)
Eosinophils Absolute: 0.5 10*3/uL (ref 0.0–0.7)
HEMATOCRIT: 47.7 % — AB (ref 36.0–46.0)
Hemoglobin: 16.1 g/dL — ABNORMAL HIGH (ref 12.0–15.0)
LYMPHS ABS: 1.5 10*3/uL (ref 0.7–4.0)
Lymphocytes Relative: 21 % (ref 12.0–46.0)
MCHC: 33.8 g/dL (ref 30.0–36.0)
MCV: 86.9 fl (ref 78.0–100.0)
MONOS PCT: 12.1 % — AB (ref 3.0–12.0)
Monocytes Absolute: 0.9 10*3/uL (ref 0.1–1.0)
NEUTROS ABS: 4.3 10*3/uL (ref 1.4–7.7)
NEUTROS PCT: 58.9 % (ref 43.0–77.0)
Platelets: 159 10*3/uL (ref 150.0–400.0)
RBC: 5.49 Mil/uL — AB (ref 3.87–5.11)
RDW: 16.2 % — ABNORMAL HIGH (ref 11.5–15.5)
WBC: 7.3 10*3/uL (ref 4.0–10.5)

## 2015-10-19 LAB — BASIC METABOLIC PANEL
BUN: 13 mg/dL (ref 6–23)
CALCIUM: 9.2 mg/dL (ref 8.4–10.5)
CHLORIDE: 103 meq/L (ref 96–112)
CO2: 26 meq/L (ref 19–32)
Creatinine, Ser: 0.77 mg/dL (ref 0.40–1.20)
GFR: 81.07 mL/min (ref >60.00)
GLUCOSE: 152 mg/dL — AB (ref 70–99)
POTASSIUM: 4.3 meq/L (ref 3.5–5.1)
SODIUM: 139 meq/L (ref 135–145)

## 2015-10-19 LAB — HEPATIC FUNCTION PANEL
ALT: 20 U/L (ref 0–35)
AST: 15 U/L (ref 0–37)
Albumin: 3.9 g/dL (ref 3.5–5.2)
Alkaline Phosphatase: 58 U/L (ref 39–117)
BILIRUBIN DIRECT: 0.1 mg/dL (ref 0.0–0.3)
BILIRUBIN TOTAL: 0.4 mg/dL (ref 0.2–1.2)
Total Protein: 6.7 g/dL (ref 6.0–8.3)

## 2015-10-19 LAB — LIPID PANEL
Cholesterol: 188 mg/dL (ref 0–200)
HDL: 39.8 mg/dL (ref >39.00)
NONHDL: 148.25
Total CHOL/HDL Ratio: 5
Triglycerides: 303 mg/dL — ABNORMAL HIGH (ref 0.0–149.0)
VLDL: 60.6 mg/dL — AB (ref 0.0–40.0)

## 2015-10-19 LAB — HEMOGLOBIN A1C: Hgb A1c MFr Bld: 7.3 % — ABNORMAL HIGH (ref 4.6–6.5)

## 2015-10-19 LAB — LDL CHOLESTEROL, DIRECT: Direct LDL: 114 mg/dL

## 2015-10-19 LAB — VITAMIN D 25 HYDROXY (VIT D DEFICIENCY, FRACTURES): VITD: 21.23 ng/mL — ABNORMAL LOW (ref 30.00–100.00)

## 2015-10-19 NOTE — Progress Notes (Signed)
Order placed for f/u cbc.

## 2015-10-21 ENCOUNTER — Encounter: Payer: Self-pay | Admitting: Family Medicine

## 2015-10-21 ENCOUNTER — Ambulatory Visit (INDEPENDENT_AMBULATORY_CARE_PROVIDER_SITE_OTHER): Payer: PRIVATE HEALTH INSURANCE

## 2015-10-21 ENCOUNTER — Ambulatory Visit (INDEPENDENT_AMBULATORY_CARE_PROVIDER_SITE_OTHER): Payer: PRIVATE HEALTH INSURANCE | Admitting: Family Medicine

## 2015-10-21 VITALS — BP 122/64 | HR 91 | Temp 98.3°F | Ht 65.5 in | Wt 218.0 lb

## 2015-10-21 DIAGNOSIS — J441 Chronic obstructive pulmonary disease with (acute) exacerbation: Secondary | ICD-10-CM

## 2015-10-21 MED ORDER — BENZONATATE 200 MG PO CAPS: 200.0000 mg | ORAL_CAPSULE | Freq: Two times a day (BID) | ORAL | Status: DC | PRN

## 2015-10-21 MED ORDER — PREDNISONE 50 MG PO TABS: 50.0000 mg | ORAL_TABLET | Freq: Every day | ORAL | Status: DC

## 2015-10-21 MED ORDER — DOXYCYCLINE HYCLATE 100 MG PO TABS: 100.0000 mg | ORAL_TABLET | Freq: Two times a day (BID) | ORAL | Status: DC

## 2015-10-21 NOTE — Assessment & Plan Note (Addendum)
Symptoms most consistent with COPD exacerbation and upper respiratory infection. Given wheezing and productive cough will start on prednisone and doxycycline. We'll obtain a chest x-ray as well. She will continue her albuterol inhaler every 4 hours for the next 2 days and then as needed. Tessalon for cough. Discussed monitoring her blood sugars while she is on prednisone. She has a sliding scale insulin through her endocrinologist for sugars greater than 250. Encouraged her to use this as previously advised. Given return precautions.

## 2015-10-21 NOTE — Progress Notes (Signed)
[name], female   DOB: 1955-05-22, 60 y.o.   MRN: [id number]  Tommi Rumps, MD Phone: [phone]  Amanda Wiley is a 60 y.o. female who presents today for same-day visit.  COPD exacerbation: Patient notes 3-4 days of cough, chest congestion, sinus congestion, and wheezing. Has been sneezing and her ears have been achy as well. Notes her chest feels full though has no chest pain or pressure. Mild trouble breathing with coughing though none otherwise. No fevers. Has been using her albuterol every 3 hours. Also using Advair and Flonase. Has had sick contacts recently.  PMH: Former smoker. History of COPD. History of asthma.   ROS see history of present illness  Objective  Physical Exam Filed Vitals:   10/21/15 1558  BP: 122/64  Pulse: 91  Temp: 98.3 F (36.8 C)    BP Readings from Last 3 Encounters:  10/21/15 122/64  10/12/15 120/70  09/12/15 114/62   Wt Readings from Last 3 Encounters:  10/21/15 218 lb (98.884 kg)  10/12/15 216 lb (97.977 kg)  09/12/15 218 lb 12.8 oz (99.247 kg)    Physical Exam  Constitutional: No distress.  HENT:  Head: Normocephalic and atraumatic.  Right Ear: External ear normal.  Left Ear: External ear normal.  Mouth/Throat: Oropharynx is clear and moist. No oropharyngeal exudate.  Normal TMs bilaterally  Eyes: Conjunctivae are normal. Pupils are equal, round, and reactive to light.  Cardiovascular: Normal rate, regular rhythm and normal heart sounds.   Pulmonary/Chest: Effort normal. No respiratory distress. She has wheezes (mild expiratory wheezes). She has no rales.  Neurological: She is alert. Gait normal.  Skin: Skin is warm and dry. She is not diaphoretic.     Assessment/Plan: Please see individual problem list.  COPD exacerbation Symptoms most consistent with COPD exacerbation and upper respiratory infection. Given wheezing and productive cough will start on prednisone and doxycycline. We'll obtain a chest x-ray  as well. She will continue her albuterol inhaler every 4 hours for the next 2 days and then as needed. Tessalon for cough. Discussed monitoring her blood sugars while she is on prednisone. She has a sliding scale insulin through her endocrinologist for sugars greater than 250. Encouraged her to use this as previously advised. Given return precautions.    Orders Placed This Encounter  Procedures  . DG Chest 2 View    Standing Status: Future     Number of Occurrences: 1     Standing Expiration Date: 12/21/2016    Order Specific Question:  Reason for Exam (SYMPTOM  OR DIAGNOSIS REQUIRED)    Answer:  cough, wheezing, chest congestion, mild shortness of breath    Order Specific Question:  Is the patient pregnant?    Answer:  No    Order Specific Question:  Preferred imaging location?    Answer:  [facility] ordered this encounter  Medications  . predniSONE (DELTASONE) 50 MG tablet    Sig: Take 1 tablet (50 mg total) by mouth daily.    Dispense:  5 tablet    Refill:  0  . doxycycline (VIBRA-TABS) 100 MG tablet    Sig: Take 1 tablet (100 mg total) by mouth 2 (two) times daily.    Dispense:  14 tablet    Refill:  0  . benzonatate (TESSALON) 200 MG capsule    Sig: Take 1 capsule (200 mg total) by mouth 2 (two) times daily as needed for cough.    Dispense:  20  capsule    Refill:  0    Tommi Rumps, MD [place]

## 2015-10-21 NOTE — Progress Notes (Signed)
Pre visit review using our clinic review tool, if applicable. No additional management support is needed unless otherwise documented below in the visit note. 

## 2015-10-21 NOTE — Patient Instructions (Signed)
Nice to see you. Your symptoms are likely related to a COPD exacerbation. We will treat you with prednisone and doxycycline. You can also take Tessalon for cough. You should continue to use her albuterol inhaler every 4 hours for the next 2 days. If you develop shortness of breath, chest pain, cough productive of blood, fevers, or any new or changing symptoms please seek medical attention.

## 2015-10-26 ENCOUNTER — Ambulatory Visit (INDEPENDENT_AMBULATORY_CARE_PROVIDER_SITE_OTHER): Payer: PRIVATE HEALTH INSURANCE | Admitting: Family Medicine

## 2015-10-26 ENCOUNTER — Encounter: Payer: Self-pay | Admitting: Family Medicine

## 2015-10-26 VITALS — BP 116/68 | HR 96 | Temp 98.2°F | Ht 65.5 in | Wt 215.6 lb

## 2015-10-26 DIAGNOSIS — J441 Chronic obstructive pulmonary disease with (acute) exacerbation: Secondary | ICD-10-CM | POA: Diagnosis not present

## 2015-10-26 MED ORDER — PREDNISONE 10 MG PO TABS: ORAL_TABLET | ORAL | Status: DC

## 2015-10-26 MED ORDER — LEVOFLOXACIN 500 MG PO TABS: 500.0000 mg | ORAL_TABLET | Freq: Every day | ORAL | Status: DC

## 2015-10-26 NOTE — Patient Instructions (Signed)
Nice to see you. We will extend your prednisone and taper this over the next 12 days. We'll start you on Levaquin as well. If you develop rash or trouble breathing well on the Levaquin please seek medical attention. If you develop chest pain, shortness of breath, cough productive of blood, fevers, or any new or changing symptoms please seek medical attention.

## 2015-10-26 NOTE — Progress Notes (Signed)
Pre visit review using our clinic review tool, if applicable. No additional management support is needed unless otherwise documented below in the visit note. 

## 2015-10-26 NOTE — Progress Notes (Signed)
  Tommi Rumps, MD Phone: [phone]  Amanda Wiley is a 60 y.o. female who presents today for follow-up.  Patient seen about a week ago for upper respiratory symptoms and COPD exacerbation. She has finished the prednisone and is on her last doxycycline. She notes continuing to wheeze. Continuing to cough. Cough is productive of green sputum. Blowing green sputum out of her nose as well. No shortness of breath. No chest pain. Does note some rib soreness posteriorly and on her sides. Using her nebulizer 2-3 times a day. No fevers. Some chills. No recent surgeries or trips. No leg swelling.  ROS see history of present illness  Objective  Physical Exam Filed Vitals:   10/26/15 1255  BP: 116/68  Pulse: 96  Temp: 98.2 F (36.8 C)    BP Readings from Last 3 Encounters:  10/26/15 116/68  10/21/15 122/64  10/12/15 120/70   Wt Readings from Last 3 Encounters:  10/26/15 215 lb 9.6 oz (97.796 kg)  10/21/15 218 lb (98.884 kg)  10/12/15 216 lb (97.977 kg)    Physical Exam  Constitutional: No distress.  HENT:  Head: Normocephalic and atraumatic.  Right Ear: External ear normal.  Left Ear: External ear normal.  Mouth/Throat: Oropharynx is clear and moist. No oropharyngeal exudate.  Normal TMs, bilateral maxillary sinuses mildly tender to percussion  Eyes: Conjunctivae are normal. Pupils are equal, round, and reactive to light.  Neck: Neck supple.  Cardiovascular: Normal rate, regular rhythm and normal heart sounds.   Pulmonary/Chest: Effort normal. No respiratory distress. She has wheezes (scattered expiratory wheezes). She has no rales.  Musculoskeletal:  Mild soreness on palpation of right lower posterior ribs  Lymphadenopathy:    She has no cervical adenopathy.  Neurological: She is alert. Gait normal.  Skin: Skin is warm and dry. She is not diaphoretic.     Assessment/Plan: Please see individual problem list.  COPD exacerbation Patient with continued symptoms of COPD  exacerbation and upper respiratory symptoms. Oxygenation is normal today. Vital signs are stable. She did have a chest x-ray at her last visit that was unremarkable for infectious cause. Given persistent symptoms we will extend her prednisone and taper over the next 12 days. We will start her on Levaquin. She has taken this in the past 2-3 months with no issues. Reports she had rash with Avelox in 2014 and possible shortness of breath though has tolerated Levaquin more recently. Discussed potential for interaction with this allergy though given that she has tolerated Levaquin in the past we will proceed with treatment. Warned to monitor for allergic reaction symptoms and if these occur she should seek medical attention. She'll continue her nebulizers. She is given return precautions.    No orders of the defined types were placed in this encounter.    Meds ordered this encounter  Medications  . levofloxacin (LEVAQUIN) 500 MG tablet    Sig: Take 1 tablet (500 mg total) by mouth daily.    Dispense:  7 tablet    Refill:  0  . predniSONE (DELTASONE) 10 MG tablet    Sig: Please take 40 mg (4 tablets) by mouth daily for 3 days, then take 30 mg (3 tablets) by mouth for 3 days, then take 20 mg (2 tablets) by mouth for 3 days, then take 10 mg (one tablet) by mouth for 3 days    Dispense:  31 tablet    Refill:  0    Tommi Rumps, MD [place]

## 2015-10-26 NOTE — Assessment & Plan Note (Signed)
Patient with continued symptoms of COPD exacerbation and upper respiratory symptoms. Oxygenation is normal today. Vital signs are stable. She did have a chest x-ray at her last visit that was unremarkable for infectious cause. Given persistent symptoms we will extend her prednisone and taper over the next 12 days. We will start her on Levaquin. She has taken this in the past 2-3 months with no issues. Reports she had rash with Avelox in 2014 and possible shortness of breath though has tolerated Levaquin more recently. Discussed potential for interaction with this allergy though given that she has tolerated Levaquin in the past we will proceed with treatment. Warned to monitor for allergic reaction symptoms and if these occur she should seek medical attention. She'll continue her nebulizers. She is given return precautions.

## 2015-11-02 ENCOUNTER — Institutional Professional Consult (permissible substitution): Payer: PRIVATE HEALTH INSURANCE | Admitting: Internal Medicine

## 2015-11-03 ENCOUNTER — Ambulatory Visit (INDEPENDENT_AMBULATORY_CARE_PROVIDER_SITE_OTHER): Payer: PRIVATE HEALTH INSURANCE | Admitting: Internal Medicine

## 2015-11-03 ENCOUNTER — Encounter: Payer: Self-pay | Admitting: Internal Medicine

## 2015-11-03 ENCOUNTER — Institutional Professional Consult (permissible substitution): Payer: PRIVATE HEALTH INSURANCE | Admitting: Internal Medicine

## 2015-11-03 VITALS — BP 126/74 | HR 92 | Ht 65.0 in | Wt 218.2 lb

## 2015-11-03 DIAGNOSIS — J069 Acute upper respiratory infection, unspecified: Secondary | ICD-10-CM | POA: Diagnosis not present

## 2015-11-03 DIAGNOSIS — J441 Chronic obstructive pulmonary disease with (acute) exacerbation: Secondary | ICD-10-CM

## 2015-11-03 DIAGNOSIS — R0600 Dyspnea, unspecified: Secondary | ICD-10-CM

## 2015-11-03 DIAGNOSIS — R05 Cough: Secondary | ICD-10-CM | POA: Diagnosis not present

## 2015-11-03 DIAGNOSIS — R059 Cough, unspecified: Secondary | ICD-10-CM

## 2015-11-03 MED ORDER — TIOTROPIUM BROMIDE MONOHYDRATE 2.5 MCG/ACT IN AERS: 2.0000 | INHALATION_SPRAY | Freq: Every day | RESPIRATORY_TRACT | 0 refills | Status: DC

## 2015-11-03 MED ORDER — TIOTROPIUM BROMIDE MONOHYDRATE 2.5 MCG/ACT IN AERS: 2.0000 | INHALATION_SPRAY | Freq: Every day | RESPIRATORY_TRACT | 3 refills | Status: DC

## 2015-11-03 NOTE — Patient Instructions (Addendum)
Follow up with Dr. Stevenson Clinch in:6-8 weeks - continue with your current inhalers - when at home, you may use duoneb as needed - when away from home then can use albuterol inhaler as needed - complete out the current course of Antibiotic and prednisone - we will give you a sample of Spiriva Respimat 2.9mg - 2 inhalations daily in the mornings, -gargle and rinse after each use. We will also give you a written Rx. 90 day supply, 3 refills - PFTs and 6MWT

## 2015-11-03 NOTE — Assessment & Plan Note (Signed)
See plan for COPD exacerbation

## 2015-11-03 NOTE — Assessment & Plan Note (Signed)
Etiology-secondary to COPD exacerbation. Trigger-dust and tobacco products at [facility]  Plan: -Allergen avoidance -Complete current course of antibiotics and steroids -Pulmonary function tests and 6 minute walk test prior to follow-up visit

## 2015-11-03 NOTE — Progress Notes (Signed)
[facility] Consultation    Date: 11/03/2015  MRN# [id number] Amanda Wiley 1955-04-16  Referring Physician: Dr. Nicki Reaper  PMD - Dr. Pearletha Forge is a 60 y.o. old female seen in consultation for recurrent COPD exacerbation   CC:  Chief Complaint  Patient presents with  . pulmonary consult    per Dr. Nicki Reaper for COPD. dx with COPD around 06-13-98. seen Dr. Raul Del last around June 13, 2010. currently on prednisone & abx for bronchitis. c/o sob w/exertion, prod cough w/clear mucus, wheezing & chest tightnessX79mo    HPI:  Patient is a pleasant 60year old female past medical history of COPD, Crohn's disease, depression, diabetes, hypercholesterolemia, hyperlipidemia, hypertension seen in consultation for recurrent COPD exacerbation. History per the patient and review of chart. Since May 2017 patient's been having recurrent COPD exacerbations which manifests as cough, productive sputum, worsening shortness of breath, and frequent use of rescue inhaler. She's been treated with multiple rounds of antibiotics and steroids, shortly after steroid tapers are completed patient has symptoms again. Her most recent treatment was July 19 where she's been giving a prednisone taper over 12 days along with a Levaquin prescription. She has 4 days left on her prednisone and 2 days left on Levaquin. Today she states that she has some mild greenish to clear productive sputum, intermittent cough, and shortness of breath mainly with incline surfaces. States that she has allergic symptoms also which include watery eyes and runny nose. Patient states that usually in the spring she may have one COPD exacerbation that lasts a short time, however this year it has been longer than normal. Her mother passed away in F2024-03-06 and her brother committed suicide on Memorial Day, she's been having intense emotional stress. Since May, she has been renovating and cleaning her mother's house, exposed to significant  dust and smoke in the carpet and walls. Her mother and father, per patient, was heavy smokers. She's also had sick contact exposure at work that could also be triggering her COPD. After today's visit, patient believes that trigger could be the daily visitation of her mother's house and cleaning with exposure to tobacco laden products in the house and dust. Patient quit smoking in 107-Mar-1995previously smoke 2 packs per day for 15 years, has 2 dogs at home.   PMHX:   Past Medical History:  Diagnosis Date  . COPD (chronic obstructive pulmonary disease) ([facility]   . Cro[place]'s disease ([facility]   . Depression   . Diabetes mellitus ([facility]   . Hypercholesterolemia   . Hypertension   . IBS (irritable bowel syndrome)   . Nephrolithiasis    [facility]   Surgical Hx:  Past Surgical History:  Procedure Laterality Date  . ABDOMINAL HYSTERECTOMY  1985  . APPENDECTOMY  1985  . BREAST BIOPSY  22013-03-07 . LAPAROSCOPIC CHOLECYSTECTOMY  2March 07, 2005  Dr STamala Julian . LAPAROSCOPIC SUPRACERVICAL HYSTERECTOMY  1985   left ovary not removed  . LITHOTRIPSY     x7  . POLYPECTOMY    . SHOULDER SURGERY     right (pinning)  . SHOULDER SURGERY     left (pinning)  . TONSILLECTOMY  1966   Family Hx:  Family History  Problem Relation Age of Onset  . Heart disease Father   . Emphysema Maternal Grandmother   . COPD Mother   . Hypercholesterolemia Mother   . Emphysema Mother   . Hypertension Brother   . Emphysema      grandmother  .  Breast cancer Neg Hx   . Colon cancer Neg Hx    Social Hx:   Social History  Substance Use Topics  . Smoking status: Former Smoker    Packs/day: 2.00    Years: 15.00    Quit date: 04/09/1994  . Smokeless tobacco: Never Used     Comment: QUIT IN 1990'S  . Alcohol use 0.0 oz/week     Comment: OCCASIONALLY   Medication:   Current Outpatient Rx  . Order #: [id number] Class: Historical Med  . Order #: [id number] Class: Print  . Order #: [id number] Class: Print  . Order  #: [id number] Class: Print  . Order #: [id number] Class: Normal  . Order #: [id number] Class: Normal  . Order #: [id number] Class: Historical Med  . Order #: [id number] Class: Print  . Order #: [id number] Class: Normal  . Order #: [id number] Class: Print  . Order #: [id number] Class: Print  . Order #: [id number] Class: Historical Med  . Order #: [id number] Class: Normal  . Order #: [id number] Class: Normal  . Order #: [id number] Class: Print  . Order #: [id number] Class: Print  . Order #: [id number] Class: Print  . Order #: [id number] Class: Print  . Order #: [id number] Class: Print  . Order #: [id number] Class: Normal  . Order #: [id number] Class: Print  . Order #: [id number] Class: Sample      Allergies:  Avelox [moxifloxacin hcl in nacl]; Moxifloxacin; Allegra [fexofenadine hcl]; Sulfa antibiotics; Tegretol [carbamazepine]; Iodine; Ioxaglate; and Ivp dye [iodinated diagnostic agents]  Review of Systems  Constitutional: Negative for chills and fever.  HENT: Positive for congestion and sore throat.   Eyes: Negative for blurred vision.  Respiratory: Positive for cough, sputum production, shortness of breath and wheezing.   Cardiovascular: Negative for chest pain.  Gastrointestinal: Positive for nausea. Negative for heartburn and vomiting.  Genitourinary: Negative for dysuria.  Musculoskeletal: Negative for myalgias.  Skin: Negative for rash.  Neurological: Positive for weakness. Negative for dizziness and headaches.  Endo/Heme/Allergies: Does not bruise/bleed easily.  Psychiatric/Behavioral: Negative for depression.     Physical Examination:   VS: BP 126/74 (BP Location: Left Arm, Cuff Size: Normal)   Pulse 92   Ht 5' 5"  (1.651 m)   Wt 218 lb 3.2 oz (99 kg)   SpO2 97%   BMI 36.31 kg/m   General Appearance: No distress  Neuro:without focal findings, mental status, speech normal, alert and oriented, cranial nerves 2-12 intact, reflexes normal and symmetric, sensation grossly normal  HEENT: PERRLA, EOM  intact, erythema of the posterior oropharynx, no ptosis, no other lesions noticed; Mallampati 3 Pulmonary: coarse upper airway sounds., diaphragmatic excursion normal.No wheezing, No rales;   Sputum Production:  none CardiovascularNormal S1,S2.  No m/r/g.  Abdominal aorta pulsation normal.    Abdomen: Benign, Soft, non-tender, No masses, hepatosplenomegaly, No lymphadenopathy Renal:  No costovertebral tenderness  GU:  No performed at this time. Endoc: No evident thyromegaly, no signs of acromegaly or Cushing features Skin:   warm, no rashes, no ecchymosis  Extremities: normal, no cyanosis, clubbing, no edema, warm with normal capillary refill. Other findings:none   Rad results: (The following images and results were reviewed by Dr. Stevenson Clinch on 11/03/2015). CXR 98921 EXAM: CHEST  2 VIEW  COMPARISON:  Aug 24, 2015  FINDINGS: There is no edema or consolidation. Heart size and pulmonary vascularity are normal. No adenopathy. There is atherosclerotic calcification in the aortic arch. There is postoperative change in the right shoulder region.  IMPRESSION: No edema or consolidation.  Aortic atherosclerosis.    Assessment and Plan: URI (upper respiratory infection) Patient with another  episode of upper respiratory tract infection and COPD exacerbation. I believe that a constant exposure to dust and tobacco products at her mother's house as a trigger for her unresolved COPD exacerbation at this time. Plan: -Complete current dose of antibiotic and steroid (Levaquin and Prednisone) -Avoid allergen exposure -Step up therapy with the addition of Spiriva respimat  COPD exacerbation This is probably a second or third episode of COPD exacerbation since May. We have identified a possible trigger of  dust and constant exposure to tobacco products from her mother's house as a possible irritant could be causing her frequent COPD exacerbation.  Plan: -6 minute walk test and PFTs prior to  follow-up visit -Avoidance of allergens -Step up therapy with anticholinergic inhalations, Spiriva respimat -Complete course of current antibiotics and steroids  Acute exacerbation of chronic obstructive airways disease See plan for COPD exacerbation  Cough Etiology-secondary to COPD exacerbation. Trigger-dust and tobacco products at [facility]  Plan: -Allergen avoidance -Complete current course of antibiotics and steroids -Pulmonary function tests and 6 minute walk test prior to follow-up visit  Dyspnea Multifactorial: Frequent COPD exacerbation, recurrent upper respiratory tract infection, allergies  Most likely due to chronic allergen exposure and COPD exacerbations from Dustin tobacco products at mother's house, and cleaning mother's house on a daily basis along with renovation.  Plan: -Allergen avoidance -Pulmonary function test -6 minute walk test   Updated Medication List Outpatient Encounter Prescriptions as of 11/03/2015  Medication Sig  . Albiglutide (TANZEUM) 30 MG PEN Inject 50 mg into the skin. Take every Tuesday  . albuterol (PROVENTIL HFA;VENTOLIN HFA) 108 (90 BASE) MCG/ACT inhaler Inhale 2 puffs into the lungs every 6 (six) hours as needed.  . ALPRAZolam (XANAX) 0.25 MG tablet Take 1 tablet (0.25 mg total) by mouth 2 (two) times daily as needed for anxiety.  Marland Kitchen amLODipine (NORVASC) 5 MG tablet Take 1 tablet (5 mg total) by mouth daily.  . benzonatate (TESSALON) 200 MG capsule Take 1 capsule (200 mg total) by mouth 2 (two) times daily as needed for cough.  Marland Kitchen buPROPion (WELLBUTRIN XL) 300 MG 24 hr tablet take 1 tablet by mouth once daily  . empagliflozin (JARDIANCE) 25 MG TABS tablet Take 25 mg by mouth daily.  Marland Kitchen esomeprazole (NEXIUM) 40 MG capsule Take 1 capsule (40 mg total) by mouth 2 (two) times daily.  . fluticasone (FLONASE) 50 MCG/ACT nasal spray Place 2 sprays into both nostrils daily.  . fluticasone-salmeterol (ADVAIR HFA) 230-21 MCG/ACT inhaler Inhale  2 puffs into the lungs 2 (two) times daily.  . furosemide (LASIX) 20 MG tablet Take 1 tablet (20 mg total) by mouth daily.  . hyoscyamine (LEVSIN SL) 0.125 MG SL tablet Place 0.125 mg under the tongue every 6 (six) hours as needed.  Marland Kitchen ipratropium-albuterol (DUONEB) 0.5-2.5 (3) MG/3ML SOLN TAKE 3 MLS BY NEBULIZATION EVERY 8 (EIGHT) HOURS AS NEEDED.  Marland Kitchen levofloxacin (LEVAQUIN) 500 MG tablet Take 1 tablet (500 mg total) by mouth daily.  Marland Kitchen losartan (COZAAR) 100 MG tablet Take 1 tablet (100 mg total) by mouth daily.  . metFORMIN (GLUCOPHAGE) 500 MG tablet 2 tablets bid  . metoprolol succinate (TOPROL XL) 50 MG 24 hr tablet Take 1 tablet (50 mg total) by mouth daily. Take with or immediately following a meal.  . montelukast (SINGULAIR) 10 MG tablet Take 1 tablet (10 mg total) by mouth daily.  . predniSONE (DELTASONE) 10 MG tablet Please take 40 mg (4 tablets) by mouth daily for 3 days, then take 30 mg (3  tablets) by mouth for 3 days, then take 20 mg (2 tablets) by mouth for 3 days, then take 10 mg (one tablet) by mouth for 3 days  . Vitamin D, Ergocalciferol, (DRISDOL) 50000 UNITS CAPS capsule TAKE 1 CAPSULE (50,000 UNITS TOTAL) BY MOUTH EVERY 7 (SEVEN) DAYS.  . Tiotropium Bromide Monohydrate (SPIRIVA RESPIMAT) 2.5 MCG/ACT AERS Inhale 2 puffs into the lungs daily.  . Tiotropium Bromide Monohydrate (SPIRIVA RESPIMAT) 2.5 MCG/ACT AERS Inhale 2 puffs into the lungs daily.   No facility-administered encounter medications on file as of 11/03/2015.     Orders for this visit: Orders Placed This Encounter  Procedures  . Pulmonary function test    Standing Status:   Future    Standing Expiration Date:   11/02/2016    Order Specific Question:   Where should this test be performed?    Answer:   [facility]    Order Specific Question:   Full PFT: includes the following: basic spirometry, spirometry pre & post bronchodilator, diffusion capacity (DLCO), lung volumes    Answer:   Full PFT  . 6 minute walk      Standing Status:   Future    Standing Expiration Date:   11/02/2016    Order Specific Question:   Where should this test be performed?    Answer:   Other     Thank  you for the consultation and for allowing [facility] to assist in the care of your patient. Our recommendations are noted above.  Please contact us if we can be of further service.   Vilinda Boehringer, MD [facility] Office Number: [phone]  Note: This note was prepared with Dragon dictation along with smaller phrase technology. Any transcriptional errors that result from this process are unintentional.

## 2015-11-03 NOTE — Assessment & Plan Note (Signed)
Multifactorial: Frequent COPD exacerbation, recurrent upper respiratory tract infection, allergies  Most likely due to chronic allergen exposure and COPD exacerbations from Dustin tobacco products at [facility] [employer], and cleaning mother's house on a daily basis along with renovation.  Plan: -Allergen avoidance -Pulmonary function test -6 minute walk test

## 2015-11-03 NOTE — Progress Notes (Signed)
[name], female   DOB: 1955-09-02, 60 y.o.   MRN: [id number] Patient seen in the office today and instructed on use of spiriva respimat.  Patient expressed understanding and demonstrated technique.

## 2015-11-03 NOTE — Assessment & Plan Note (Signed)
This is probably a second or third episode of COPD exacerbation since May. We have identified a possible trigger of  dust and constant exposure to tobacco products from her mother's house as a possible irritant could be causing her frequent COPD exacerbation.  Plan: -6 minute walk test and PFTs prior to follow-up visit -Avoidance of allergens -Step up therapy with anticholinergic inhalations, Spiriva respimat -Complete course of current antibiotics and steroids

## 2015-11-03 NOTE — Assessment & Plan Note (Addendum)
Patient with another episode of upper respiratory tract infection and COPD exacerbation. I believe that a constant exposure to dust and tobacco products at her mother's house as a trigger for her unresolved COPD exacerbation at this time. Plan: -Complete current dose of antibiotic and steroid (Levaquin and Prednisone) -Avoid allergen exposure -Step up therapy with the addition of Spiriva respimat

## 2015-11-28 ENCOUNTER — Telehealth: Payer: Self-pay | Admitting: Internal Medicine

## 2015-11-28 MED ORDER — AZITHROMYCIN 250 MG PO TABS: ORAL_TABLET | ORAL | 0 refills | Status: AC

## 2015-11-28 MED ORDER — PREDNISONE 20 MG PO TABS: ORAL_TABLET | ORAL | 0 refills | Status: DC

## 2015-11-28 NOTE — Telephone Encounter (Signed)
DK gave verbal recommendations. z pak & prednisone 52m X10d Rx sent to preferred pharmacy. Pt aware and voices understanding. Nothing further needed.

## 2015-11-28 NOTE — Telephone Encounter (Signed)
Pt states this weekend she has been wheezing, this started on Thursday, and over the weekend got worse. States she is at work today, and is wheezing terribly. Please call.

## 2015-11-29 ENCOUNTER — Telehealth: Payer: Self-pay

## 2015-11-29 NOTE — Telephone Encounter (Signed)
error 

## 2015-11-30 ENCOUNTER — Telehealth: Payer: Self-pay

## 2015-11-30 NOTE — Telephone Encounter (Signed)
Pt advised to continue both spiriva and advair until her f/u appt and a decision will be made rather she should continue with both or not. Nothing further needed.

## 2015-12-13 ENCOUNTER — Ambulatory Visit (INDEPENDENT_AMBULATORY_CARE_PROVIDER_SITE_OTHER): Payer: PRIVATE HEALTH INSURANCE | Admitting: Internal Medicine

## 2015-12-13 ENCOUNTER — Encounter: Payer: Self-pay | Admitting: Internal Medicine

## 2015-12-13 VITALS — BP 118/60 | HR 79 | Temp 98.0°F | Resp 17 | Ht 64.25 in | Wt 220.2 lb

## 2015-12-13 DIAGNOSIS — Z1239 Encounter for other screening for malignant neoplasm of breast: Secondary | ICD-10-CM

## 2015-12-13 DIAGNOSIS — Z0000 Encounter for general adult medical examination without abnormal findings: Secondary | ICD-10-CM | POA: Diagnosis not present

## 2015-12-13 DIAGNOSIS — F329 Major depressive disorder, single episode, unspecified: Secondary | ICD-10-CM

## 2015-12-13 DIAGNOSIS — F418 Other specified anxiety disorders: Secondary | ICD-10-CM

## 2015-12-13 DIAGNOSIS — I10 Essential (primary) hypertension: Secondary | ICD-10-CM

## 2015-12-13 DIAGNOSIS — J441 Chronic obstructive pulmonary disease with (acute) exacerbation: Secondary | ICD-10-CM

## 2015-12-13 DIAGNOSIS — E7800 Pure hypercholesterolemia, unspecified: Secondary | ICD-10-CM | POA: Diagnosis not present

## 2015-12-13 DIAGNOSIS — E119 Type 2 diabetes mellitus without complications: Secondary | ICD-10-CM

## 2015-12-13 DIAGNOSIS — J45901 Unspecified asthma with (acute) exacerbation: Secondary | ICD-10-CM

## 2015-12-13 DIAGNOSIS — K5090 Crohn's disease, unspecified, without complications: Secondary | ICD-10-CM

## 2015-12-13 DIAGNOSIS — E559 Vitamin D deficiency, unspecified: Secondary | ICD-10-CM

## 2015-12-13 DIAGNOSIS — E669 Obesity, unspecified: Secondary | ICD-10-CM | POA: Diagnosis not present

## 2015-12-13 DIAGNOSIS — F419 Anxiety disorder, unspecified: Secondary | ICD-10-CM

## 2015-12-13 MED ORDER — LOSARTAN POTASSIUM 100 MG PO TABS: 100.0000 mg | ORAL_TABLET | Freq: Every day | ORAL | 3 refills | Status: DC

## 2015-12-13 MED ORDER — FLUTICASONE-SALMETEROL 230-21 MCG/ACT IN AERO: 2.0000 | INHALATION_SPRAY | Freq: Two times a day (BID) | RESPIRATORY_TRACT | 3 refills | Status: DC

## 2015-12-13 MED ORDER — METOPROLOL SUCCINATE ER 50 MG PO TB24: 50.0000 mg | ORAL_TABLET | Freq: Every day | ORAL | 3 refills | Status: DC

## 2015-12-13 MED ORDER — BUPROPION HCL ER (XL) 300 MG PO TB24: 300.0000 mg | ORAL_TABLET | Freq: Every day | ORAL | 3 refills | Status: DC

## 2015-12-13 MED ORDER — FLUTICASONE PROPIONATE 50 MCG/ACT NA SUSP: 2.0000 | Freq: Every day | NASAL | 3 refills | Status: DC

## 2015-12-13 MED ORDER — MONTELUKAST SODIUM 10 MG PO TABS: 10.0000 mg | ORAL_TABLET | Freq: Every day | ORAL | 3 refills | Status: DC

## 2015-12-13 MED ORDER — AMLODIPINE BESYLATE 5 MG PO TABS: 5.0000 mg | ORAL_TABLET | Freq: Every day | ORAL | 3 refills | Status: DC

## 2015-12-13 MED ORDER — FUROSEMIDE 20 MG PO TABS: 20.0000 mg | ORAL_TABLET | Freq: Every day | ORAL | 3 refills | Status: DC

## 2015-12-13 MED ORDER — METFORMIN HCL 500 MG PO TABS: ORAL_TABLET | ORAL | 3 refills | Status: DC

## 2015-12-13 MED ORDER — ALBUTEROL SULFATE HFA 108 (90 BASE) MCG/ACT IN AERS: 2.0000 | INHALATION_SPRAY | Freq: Four times a day (QID) | RESPIRATORY_TRACT | 3 refills | Status: DC | PRN

## 2015-12-13 NOTE — Progress Notes (Addendum)
[name], female   DOB: 1956/02/04, 60 y.o.   MRN: [id number]   Subjective:    [name], female    DOB: 11/12/1955, 61 y.o.   MRN: [id number]  HPI  Patient here for a physical exam.  She is still under increased stress coping with her brother's suicide and her mother's death.  Discussed with her today.  She is still having some issues with sleeping.  Doing some better.  Discussed counseling.  She has a free program through her work.  Breathing is doing better.  Seeing Dr Amanda Wiley now.  Planning to f/u 12/30/15.  Planning for 6 minute walk and f/u PFTs.  Overall doing better.  Sugars are elevated and varying.  Was seeing Dr Amanda Wiley.  Request f/u with endocrinology.  Would prefer not to have to go to [place].  Discussed diet and exercise.  No chest pain.  Bowels have been overall stable.  GI issues have been better overall.     Past Medical History:  Diagnosis Date  . COPD (chronic obstructive pulmonary disease) ([place])   . Crohn's disease ([place])   . Depression   . Diabetes mellitus ([place])   . Hypercholesterolemia   . Hypertension   . IBS (irritable bowel syndrome)   . Nephrolithiasis    s/p lithotripsy [facility])   Past Surgical History:  Procedure Laterality Date  . ABDOMINAL HYSTERECTOMY  1985  . APPENDECTOMY  1985  . BREAST BIOPSY  2013  . LAPAROSCOPIC CHOLECYSTECTOMY  2005   Dr Tamala Julian  . LAPAROSCOPIC SUPRACERVICAL HYSTERECTOMY  1985   left ovary not removed  . LITHOTRIPSY     x7  . POLYPECTOMY    . SHOULDER SURGERY     right (pinning)  . SHOULDER SURGERY     left (pinning)  . TONSILLECTOMY  1966   Family History  Problem Relation Age of Onset  . Heart disease Father   . Emphysema Maternal Grandmother   . COPD Mother   . Hypercholesterolemia Mother   . Emphysema Mother   . Hypertension Brother   . Emphysema      grandmother  . Breast cancer Neg Hx   . Colon cancer Neg Hx    Social History   Social History  . Marital  status: Married    Spouse name: N/A  . Number of children: 2  . Years of education: N/A   Occupational History  .  Amanda Wiley   Social History Main Topics  . Smoking status: Former Smoker    Packs/day: 2.00    Years: 15.00    Quit date: 04/09/1994  . Smokeless tobacco: Never Used     Comment: QUIT IN 1990'S  . Alcohol use 0.0 oz/week     Comment: OCCASIONALLY  . Drug use: No  . Sexual activity: Not Asked   Other Topics Concern  . None   Social History Narrative  . None    Outpatient Encounter Prescriptions as of 12/13/2015  Medication Sig  . Albiglutide (TANZEUM) 30 MG PEN Inject 50 mg into the skin. Take every Tuesday  . albuterol (PROVENTIL HFA;VENTOLIN HFA) 108 (90 Base) MCG/ACT inhaler Inhale 2 puffs into the lungs every 6 (six) hours as needed.  . ALPRAZolam (XANAX) 0.25 MG tablet Take 1 tablet (0.25 mg total) by mouth 2 (two) times daily as needed for anxiety.  Marland Kitchen amLODipine (NORVASC) 5 MG tablet Take 1 tablet (5 mg total) by mouth daily.  Marland Kitchen buPROPion (WELLBUTRIN XL) 300  MG 24 hr tablet Take 1 tablet (300 mg total) by mouth daily.  . empagliflozin (JARDIANCE) 25 MG TABS tablet Take 25 mg by mouth daily.  . fluticasone (FLONASE) 50 MCG/ACT nasal spray Place 2 sprays into both nostrils daily.  . fluticasone-salmeterol (ADVAIR HFA) 230-21 MCG/ACT inhaler Inhale 2 puffs into the lungs 2 (two) times daily.  . furosemide (LASIX) 20 MG tablet Take 1 tablet (20 mg total) by mouth daily.  . hyoscyamine (LEVSIN SL) 0.125 MG SL tablet Place 0.125 mg under the tongue every 6 (six) hours as needed.  Marland Kitchen ipratropium-albuterol (DUONEB) 0.5-2.5 (3) MG/3ML SOLN TAKE 3 MLS BY NEBULIZATION EVERY 8 (EIGHT) HOURS AS NEEDED.  Marland Kitchen losartan (COZAAR) 100 MG tablet Take 1 tablet (100 mg total) by mouth daily.  . metFORMIN (GLUCOPHAGE) 500 MG tablet 2 tablets bid  . metoprolol succinate (TOPROL XL) 50 MG 24 hr tablet Take 1 tablet (50 mg total) by mouth daily. Take with or immediately following a  meal.  . montelukast (SINGULAIR) 10 MG tablet Take 1 tablet (10 mg total) by mouth daily.  . RABEprazole Sodium (ACIPHEX PO) Take 40 mg by mouth daily.  . Tiotropium Bromide Monohydrate (SPIRIVA RESPIMAT) 2.5 MCG/ACT AERS Inhale 2 puffs into the lungs daily.  . Vitamin D, Ergocalciferol, (DRISDOL) 50000 UNITS CAPS capsule TAKE 1 CAPSULE (50,000 UNITS TOTAL) BY MOUTH EVERY 7 (SEVEN) DAYS.  . [DISCONTINUED] albuterol (PROVENTIL HFA;VENTOLIN HFA) 108 (90 BASE) MCG/ACT inhaler Inhale 2 puffs into the lungs every 6 (six) hours as needed.  . [DISCONTINUED] amLODipine (NORVASC) 5 MG tablet Take 1 tablet (5 mg total) by mouth daily.  . [DISCONTINUED] buPROPion (WELLBUTRIN XL) 300 MG 24 hr tablet take 1 tablet by mouth once daily  . [DISCONTINUED] fluticasone (FLONASE) 50 MCG/ACT nasal spray Place 2 sprays into both nostrils daily.  . [DISCONTINUED] fluticasone-salmeterol (ADVAIR HFA) 230-21 MCG/ACT inhaler Inhale 2 puffs into the lungs 2 (two) times daily.  . [DISCONTINUED] furosemide (LASIX) 20 MG tablet Take 1 tablet (20 mg total) by mouth daily.  . [DISCONTINUED] losartan (COZAAR) 100 MG tablet Take 1 tablet (100 mg total) by mouth daily.  . [DISCONTINUED] metFORMIN (GLUCOPHAGE) 500 MG tablet 2 tablets bid  . [DISCONTINUED] metoprolol succinate (TOPROL XL) 50 MG 24 hr tablet Take 1 tablet (50 mg total) by mouth daily. Take with or immediately following a meal.  . [DISCONTINUED] montelukast (SINGULAIR) 10 MG tablet Take 1 tablet (10 mg total) by mouth daily.  . [DISCONTINUED] benzonatate (TESSALON) 200 MG capsule Take 1 capsule (200 mg total) by mouth 2 (two) times daily as needed for cough.  . [DISCONTINUED] esomeprazole (NEXIUM) 40 MG capsule Take 1 capsule (40 mg total) by mouth 2 (two) times daily.  . [DISCONTINUED] levofloxacin (LEVAQUIN) 500 MG tablet Take 1 tablet (500 mg total) by mouth daily.  . [DISCONTINUED] predniSONE (DELTASONE) 10 MG tablet Please take 40 mg (4 tablets) by mouth daily for 3  days, then take 30 mg (3 tablets) by mouth for 3 days, then take 20 mg (2 tablets) by mouth for 3 days, then take 10 mg (one tablet) by mouth for 3 days  . [DISCONTINUED] predniSONE (DELTASONE) 20 MG tablet Take 2 tablets for 10 days then stop.  . [DISCONTINUED] Tiotropium Bromide Monohydrate (SPIRIVA RESPIMAT) 2.5 MCG/ACT AERS Inhale 2 puffs into the lungs daily.   No facility-administered encounter medications on file as of 12/13/2015.     Review of Systems  Constitutional:       She has noticed she  has been stress eating.  Has gained some weight.  Discussed diet and exercise today.   HENT: Negative for congestion and sinus pressure.   Eyes: Negative for pain and visual disturbance.  Respiratory: Negative for cough, chest tightness and shortness of breath.   Cardiovascular: Negative for chest pain, palpitations and leg swelling.  Gastrointestinal: Negative for abdominal pain, diarrhea, nausea and vomiting.  Genitourinary: Negative for difficulty urinating and dysuria.  Musculoskeletal: Negative for back pain and joint swelling.  Skin: Negative for color change and rash.  Neurological: Negative for dizziness, light-headedness and headaches.  Hematological: Negative for adenopathy. Does not bruise/bleed easily.  Psychiatric/Behavioral: Negative for agitation and dysphoric mood.       Increased stress as outlined.         Objective:    Physical Exam  Constitutional: She is oriented to person, place, and time. She appears well-developed and well-nourished. No distress.  HENT:  Nose: Nose normal.  Mouth/Throat: Oropharynx is clear and moist.  Eyes: Right eye exhibits no discharge. Left eye exhibits no discharge. No scleral icterus.  Neck: Neck supple. No thyromegaly present.  Cardiovascular: Normal rate and regular rhythm.   Pulmonary/Chest: Breath sounds normal. No accessory muscle usage. No tachypnea. No respiratory distress. She has no decreased breath sounds. She has no wheezes. She  has no rhonchi. Right breast exhibits no inverted nipple, no mass, no nipple discharge and no tenderness (no axillary adenopathy). Left breast exhibits no inverted nipple, no mass, no nipple discharge and no tenderness (no axilarry adenopathy).  Abdominal: Soft. Bowel sounds are normal. There is no tenderness.  Musculoskeletal: She exhibits no edema or tenderness.  Lymphadenopathy:    She has no cervical adenopathy.  Neurological: She is alert and oriented to person, place, and time.  Skin: Skin is warm. No rash noted. No erythema.  Psychiatric: She has a normal mood and affect. Her behavior is normal.    BP 118/60   Pulse 79   Temp 98 F (36.7 C) (Oral)   Resp 17   Ht 5' 4.25" (1.632 m)   Wt 220 lb 4 oz (99.9 kg)   SpO2 96%   BMI 37.51 kg/m  Wt Readings from Last 3 Encounters:  12/13/15 220 lb 4 oz (99.9 kg)  11/03/15 218 lb 3.2 oz (99 kg)  10/26/15 215 lb 9.6 oz (97.8 kg)     Lab Results  Component Value Date   WBC 7.3 10/19/2015   HGB 16.1 Repeated and verified X2. (H) 10/19/2015   HCT 47.7 (H) 10/19/2015   PLT 159.0 10/19/2015   GLUCOSE 152 (H) 10/19/2015   CHOL 188 10/19/2015   TRIG 303.0 (H) 10/19/2015   HDL 39.80 10/19/2015   LDLDIRECT 114.0 10/19/2015   LDLCALC 148 (H) 07/24/2013   ALT 20 10/19/2015   AST 15 10/19/2015   NA 139 10/19/2015   K 4.3 10/19/2015   CL 103 10/19/2015   CREATININE 0.77 10/19/2015   BUN 13 10/19/2015   CO2 26 10/19/2015   TSH 1.53 12/23/2014   HGBA1C 7.3 (H) 10/19/2015   MICROALBUR <0.7 12/23/2014    Dg Chest 2 View  Result Date: 08/25/2015 CLINICAL DATA:  Cough.  Chest pain.  COPD. EXAM: CHEST  2 VIEW COMPARISON:  04/20/2015. FINDINGS: Mediastinum hilar structures normal. No focal infiltrate. Chronic interstitial lung disease noted. No pleural effusion or pneumothorax. Heart size normal. No acute bony abnormality. Right shoulder replacement. IMPRESSION: 1.  Chronic interstitial lung disease. 2. No acute cardiopulmonary disease.   Stable cardiomegaly .  Electronically Signed   By: Marcello Moores  Register   On: 08/25/2015 09:19       Assessment & Plan:   Problem List Items Addressed This Visit    Anxiety and depression    Increased stress as outlined.  Discussed with her today.  Hold on further medication.  Discussed counseling.  She plans to f/u at her work.        Chronic obstructive asthma with exacerbation (HCC)    Seeing pulmonary.  Breathing better.  Planning f/u as outlined.       Relevant Medications   albuterol (PROVENTIL HFA;VENTOLIN HFA) 108 (90 Base) MCG/ACT inhaler   fluticasone-salmeterol (ADVAIR HFA) 230-21 MCG/ACT inhaler   montelukast (SINGULAIR) 10 MG tablet   Crohn's disease (HCC)    Bowels overall stable.  Continue f/u with GI.       Diabetes mellitus [facility])    Was seeing Dr Amanda Wiley.  Last a1c 7.3.  Discussed diet and exercise.  Low carb diet.  Discussed referral back to endocrinology.  She prefers not to have to go to [place].  Follow sugars.  Did not bring in recorded sugar readings.        Relevant Medications   losartan (COZAAR) 100 MG tablet   metFORMIN (GLUCOPHAGE) 500 MG tablet   Other Relevant Orders   Hemoglobin A1c   Microalbumin / creatinine urine ratio   Ambulatory referral to Endocrinology   Health care maintenance    Physical today 12/13/15.  PAP 10/01/13 negative with negative HPV.  Colonoscopy 06/27/07.        Hypercholesterolemia without hypertriglyceridemia    Discussed diet and exercise.  Low carb diet.  Follow.        Relevant Medications   amLODipine (NORVASC) 5 MG tablet   furosemide (LASIX) 20 MG tablet   losartan (COZAAR) 100 MG tablet   metoprolol succinate (TOPROL XL) 50 MG 24 hr tablet   Other Relevant Orders   Lipid panel   Hepatic function panel   Hypertension    Blood pressure under good control.  Continue same medication regimen.  Follow pressures.  Follow metabolic panel.        Relevant Medications   amLODipine (NORVASC) 5 MG tablet   furosemide (LASIX)  20 MG tablet   losartan (COZAAR) 100 MG tablet   metoprolol succinate (TOPROL XL) 50 MG 24 hr tablet   Other Relevant Orders   CBC with Differential/Platelet   TSH   Basic metabolic panel   Obesity (BMI 30-39.9)    Diet and exercise.  Follow.       Relevant Medications   metFORMIN (GLUCOPHAGE) 500 MG tablet   Vitamin D deficiency    Continue vitamin D supplements.        Other Visit Diagnoses    Screening breast examination    -  Primary   Relevant Orders   MM Digital Screening   Routine general medical examination at a health care facility           Einar Pheasant, MD

## 2015-12-13 NOTE — Progress Notes (Signed)
Pre visit review using our clinic review tool, if applicable. No additional management support is needed unless otherwise documented below in the visit note. 

## 2015-12-13 NOTE — Assessment & Plan Note (Signed)
Physical today 12/13/15.  PAP 10/01/13 negative with negative HPV.  Colonoscopy 06/27/07.

## 2015-12-18 ENCOUNTER — Encounter: Payer: Self-pay | Admitting: Internal Medicine

## 2015-12-18 NOTE — Assessment & Plan Note (Signed)
Diet and exercise.  Follow.  

## 2015-12-18 NOTE — Assessment & Plan Note (Signed)
Blood pressure under good control.  Continue same medication regimen.  Follow pressures.  Follow metabolic panel.   

## 2015-12-18 NOTE — Addendum Note (Signed)
Addended by: Alisa Graff on: 12/18/2015 08:26 AM   Modules accepted: Orders

## 2015-12-18 NOTE — Assessment & Plan Note (Signed)
Was seeing Dr Eddie Dibbles.  Last a1c 7.3.  Discussed diet and exercise.  Low carb diet.  Discussed referral back to endocrinology.  She prefers not to have to go to [place].  Follow sugars.  Did not bring in recorded sugar readings.

## 2015-12-18 NOTE — Assessment & Plan Note (Signed)
Seeing pulmonary.  Breathing better.  Planning f/u as outlined.

## 2015-12-18 NOTE — Assessment & Plan Note (Signed)
Discussed diet and exercise.  Low carb diet.  Follow.

## 2015-12-18 NOTE — Assessment & Plan Note (Signed)
Bowels overall stable.  Continue f/u with GI.

## 2015-12-18 NOTE — Assessment & Plan Note (Signed)
Increased stress as outlined.  Discussed with her today.  Hold on further medication.  Discussed counseling.  She plans to f/u at her work.

## 2015-12-18 NOTE — Assessment & Plan Note (Signed)
Continue vitamin D supplements.

## 2015-12-22 ENCOUNTER — Ambulatory Visit (INDEPENDENT_AMBULATORY_CARE_PROVIDER_SITE_OTHER): Payer: PRIVATE HEALTH INSURANCE | Admitting: *Deleted

## 2015-12-22 DIAGNOSIS — R05 Cough: Secondary | ICD-10-CM

## 2015-12-22 DIAGNOSIS — J441 Chronic obstructive pulmonary disease with (acute) exacerbation: Secondary | ICD-10-CM | POA: Diagnosis not present

## 2015-12-22 DIAGNOSIS — R059 Cough, unspecified: Secondary | ICD-10-CM

## 2015-12-22 LAB — PULMONARY FUNCTION TEST
DL/VA % pred: 125 %
DL/VA: 5.33 ml/min/mmHg/L
DLCO UNC % PRED: 114 %
DLCO UNC: 21.69 ml/min/mmHg
FEF 25-75 Post: 1.36 L/sec
FEF 25-75 Pre: 1.05 L/sec
FEF2575-%CHANGE-POST: 29 %
FEF2575-%PRED-POST: 64 %
FEF2575-%Pred-Pre: 50 %
FEV1-%Change-Post: 7 %
FEV1-%PRED-POST: 75 %
FEV1-%Pred-Pre: 69 %
FEV1-POST: 1.63 L
FEV1-Pre: 1.51 L
FEV1FVC-%Change-Post: -1 %
FEV1FVC-%PRED-PRE: 94 %
FEV6-%Change-Post: 8 %
FEV6-%Pred-Post: 82 %
FEV6-%Pred-Pre: 76 %
FEV6-PRE: 2.06 L
FEV6-Post: 2.24 L
FEV6FVC-%Pred-Post: 104 %
FEV6FVC-%Pred-Pre: 104 %
FVC-%CHANGE-POST: 8 %
FVC-%PRED-PRE: 73 %
FVC-%Pred-Post: 79 %
FVC-POST: 2.24 L
FVC-Pre: 2.06 L
POST FEV1/FVC RATIO: 73 %
PRE FEV6/FVC RATIO: 100 %
Post FEV6/FVC ratio: 100 %
Pre FEV1/FVC ratio: 73 %

## 2015-12-22 NOTE — Progress Notes (Signed)
SMW performed today. 

## 2015-12-22 NOTE — Progress Notes (Signed)
PFT performed today with nitrogen washout. 

## 2016-01-03 ENCOUNTER — Ambulatory Visit: Payer: PRIVATE HEALTH INSURANCE

## 2016-01-04 ENCOUNTER — Encounter: Payer: Self-pay | Admitting: Internal Medicine

## 2016-01-04 ENCOUNTER — Ambulatory Visit (INDEPENDENT_AMBULATORY_CARE_PROVIDER_SITE_OTHER): Payer: PRIVATE HEALTH INSURANCE | Admitting: Internal Medicine

## 2016-01-04 VITALS — BP 126/68 | HR 88 | Ht 65.5 in | Wt 222.2 lb

## 2016-01-04 DIAGNOSIS — J441 Chronic obstructive pulmonary disease with (acute) exacerbation: Secondary | ICD-10-CM

## 2016-01-04 MED ORDER — AZITHROMYCIN 250 MG PO TABS: ORAL_TABLET | ORAL | 0 refills | Status: AC

## 2016-01-04 MED ORDER — ESOMEPRAZOLE MAGNESIUM 40 MG PO CPDR: 40.0000 mg | DELAYED_RELEASE_CAPSULE | Freq: Two times a day (BID) | ORAL | 1 refills | Status: DC

## 2016-01-04 MED ORDER — PREDNISONE 20 MG PO TABS: 20.0000 mg | ORAL_TABLET | Freq: Every day | ORAL | 0 refills | Status: DC

## 2016-01-04 NOTE — Patient Instructions (Addendum)
Follow up in 4 months -Avoidance of allergens - cont with Spiriva and Advair - prednisone 80m x 5 days with breakfast - Zpak -Nexium 450m1 tab PO BID, refills - 1

## 2016-01-04 NOTE — Progress Notes (Signed)
[facility] Consultation      MRN# [id number] Amanda Wiley 02-08-1956   CC: Chief Complaint  Patient presents with  . Follow-up    28morov. SMW/PFT results.  pt states breathing has improved with spiriva. pt currently has prod cough with yellow mucus & occ wheezing X3d      Brief History: 60yo F History of COPD, Crohn's disease, seeing [facility] for COPD optimization. Now on Advair, Spiriva, allergy control.   Events since last clinic visit: Presents today for a follow up visit.  Has a cough, yellowish, fatigue and wheezing since Monday, no recent sick contact.  Good improvement overall with Spiriva.     Current Outpatient Prescriptions:  .  Albiglutide (TANZEUM) 30 MG PEN, Inject 50 mg into the skin. Take every Tuesday, Disp: , Rfl:  .  albuterol (PROVENTIL HFA;VENTOLIN HFA) 108 (90 Base) MCG/ACT inhaler, Inhale 2 puffs into the lungs every 6 (six) hours as needed., Disp: 54 g, Rfl: 3 .  ALPRAZolam (XANAX) 0.25 MG tablet, Take 1 tablet (0.25 mg total) by mouth 2 (two) times daily as needed for anxiety., Disp: 30 tablet, Rfl: 0 .  amLODipine (NORVASC) 5 MG tablet, Take 1 tablet (5 mg total) by mouth daily., Disp: 90 tablet, Rfl: 3 .  buPROPion (WELLBUTRIN XL) 300 MG 24 hr tablet, Take 1 tablet (300 mg total) by mouth daily., Disp: 90 tablet, Rfl: 3 .  empagliflozin (JARDIANCE) 25 MG TABS tablet, Take 25 mg by mouth daily., Disp: , Rfl:  .  esomeprazole (NEXIUM) 40 MG capsule, Take 40 mg by mouth daily at 12 noon., Disp: , Rfl:  .  fluticasone (FLONASE) 50 MCG/ACT nasal spray, Place 2 sprays into both nostrils daily., Disp: 48 g, Rfl: 3 .  fluticasone-salmeterol (ADVAIR HFA) 230-21 MCG/ACT inhaler, Inhale 2 puffs into the lungs 2 (two) times daily., Disp: 3 Inhaler, Rfl: 3 .  furosemide (LASIX) 20 MG tablet, Take 1 tablet (20 mg total) by mouth daily., Disp: 90 tablet, Rfl: 3 .  hyoscyamine (LEVSIN SL) 0.125 MG SL tablet, Place 0.125 mg under the  tongue every 6 (six) hours as needed., Disp: , Rfl:  .  ipratropium-albuterol (DUONEB) 0.5-2.5 (3) MG/3ML SOLN, TAKE 3 MLS BY NEBULIZATION EVERY 8 (EIGHT) HOURS AS NEEDED., Disp: 360 mL, Rfl: 1 .  losartan (COZAAR) 100 MG tablet, Take 1 tablet (100 mg total) by mouth daily., Disp: 90 tablet, Rfl: 3 .  metFORMIN (GLUCOPHAGE) 500 MG tablet, 2 tablets bid, Disp: 360 tablet, Rfl: 3 .  metoprolol succinate (TOPROL XL) 50 MG 24 hr tablet, Take 1 tablet (50 mg total) by mouth daily. Take with or immediately following a meal., Disp: 90 tablet, Rfl: 3 .  montelukast (SINGULAIR) 10 MG tablet, Take 1 tablet (10 mg total) by mouth daily., Disp: 90 tablet, Rfl: 3 .  RABEprazole Sodium (ACIPHEX PO), Take 40 mg by mouth daily., Disp: , Rfl:  .  Tiotropium Bromide Monohydrate (SPIRIVA RESPIMAT) 2.5 MCG/ACT AERS, Inhale 2 puffs into the lungs daily., Disp: 3 Inhaler, Rfl: 3 .  Vitamin D, Ergocalciferol, (DRISDOL) 50000 UNITS CAPS capsule, TAKE 1 CAPSULE (50,000 UNITS TOTAL) BY MOUTH EVERY 7 (SEVEN) DAYS., Disp: 8 capsule, Rfl: 2   Review of Systems  Constitutional: Negative for chills and fever.  HENT: Positive for congestion and sore throat.   Respiratory: Positive for cough, sputum production, shortness of breath and wheezing.   Gastrointestinal: Positive for heartburn. Negative for nausea and vomiting.  Genitourinary: Negative for dysuria.  Musculoskeletal: Negative for myalgias.  Neurological: Negative for dizziness.  Endo/Heme/Allergies: Does not bruise/bleed easily.  Psychiatric/Behavioral: Negative for depression.      Allergies:  Avelox [moxifloxacin hcl in nacl]; Moxifloxacin; Allegra [fexofenadine hcl]; Sulfa antibiotics; Tegretol [carbamazepine]; Iodine; Ioxaglate; and Ivp dye [iodinated diagnostic agents]  Physical Examination:  VS: BP 126/68 (BP Location: Left Arm, Cuff Size: Normal)   Pulse 88   Ht 5' 5.5" (1.664 m)   Wt 222 lb 3.2 oz (100.8 kg)   SpO2 94%   BMI 36.41 kg/m   General  Appearance: No distress  HEENT: PERRLA, no ptosis, no other lesions noticed Pulmonary:mild upper lobe expiratory wheezes, no crackles, good airway entry Cardiovascular:  Normal S1,S2.  No m/r/g.     Abdomen:Exam: Benign, Soft, non-tender, No masses  Skin:   warm, no rashes, no ecchymosis  Extremities: normal, no cyanosis, clubbing, warm with normal capillary refill.    Pulmonary function testing 12/22/2015 FEV1 to 69% FEV1/FVC 73% RV 72 TLC 76 ERV 27 DLCO corrected 114 Flow loops within next expiratory scooping Impression: Mild obstruction, clinical correlation advised. Significant reduction in ERV, could be related to abdominal obesity, clinical correlation advised.   6 minute walk test: Total distance 1102 feet, 336 m, low saturation 94%, highest heart rate 98.  Assessment and Plan:59 yo with mild COPD, today with URI.  COPD exacerbation This is probably a second or third episode of COPD exacerbation since May. We have identified a possible trigger of  dust and constant exposure to tobacco products from her mother's house as a possible irritant could be causing her frequent COPD exacerbation. Today it seems like she's have an URI.   PFTs with mild-moderate obstruction  Plan: -Avoidance of allergens - cont with Spiriva and Advair - prednisone 7m x 5 days with breakfast - Zpak   Updated Medication List Outpatient Encounter Prescriptions as of 01/04/2016  Medication Sig  . Albiglutide (TANZEUM) 30 MG PEN Inject 50 mg into the skin. Take every Tuesday  . albuterol (PROVENTIL HFA;VENTOLIN HFA) 108 (90 Base) MCG/ACT inhaler Inhale 2 puffs into the lungs every 6 (six) hours as needed.  . ALPRAZolam (XANAX) 0.25 MG tablet Take 1 tablet (0.25 mg total) by mouth 2 (two) times daily as needed for anxiety.  .Marland KitchenamLODipine (NORVASC) 5 MG tablet Take 1 tablet (5 mg total) by mouth daily.  .Marland KitchenbuPROPion (WELLBUTRIN XL) 300 MG 24 hr tablet Take 1 tablet (300 mg total) by mouth daily.  .  empagliflozin (JARDIANCE) 25 MG TABS tablet Take 25 mg by mouth daily.  .Marland Kitchenesomeprazole (NEXIUM) 40 MG capsule Take 40 mg by mouth daily at 12 noon.  . fluticasone (FLONASE) 50 MCG/ACT nasal spray Place 2 sprays into both nostrils daily.  . fluticasone-salmeterol (ADVAIR HFA) 230-21 MCG/ACT inhaler Inhale 2 puffs into the lungs 2 (two) times daily.  . furosemide (LASIX) 20 MG tablet Take 1 tablet (20 mg total) by mouth daily.  . hyoscyamine (LEVSIN SL) 0.125 MG SL tablet Place 0.125 mg under the tongue every 6 (six) hours as needed.  .Marland Kitchenipratropium-albuterol (DUONEB) 0.5-2.5 (3) MG/3ML SOLN TAKE 3 MLS BY NEBULIZATION EVERY 8 (EIGHT) HOURS AS NEEDED.  .Marland Kitchenlosartan (COZAAR) 100 MG tablet Take 1 tablet (100 mg total) by mouth daily.  . metFORMIN (GLUCOPHAGE) 500 MG tablet 2 tablets bid  . metoprolol succinate (TOPROL XL) 50 MG 24 hr tablet Take 1 tablet (50 mg total) by mouth daily. Take with or immediately following a meal.  . montelukast (SINGULAIR) 10  MG tablet Take 1 tablet (10 mg total) by mouth daily.  . RABEprazole Sodium (ACIPHEX PO) Take 40 mg by mouth daily.  . Tiotropium Bromide Monohydrate (SPIRIVA RESPIMAT) 2.5 MCG/ACT AERS Inhale 2 puffs into the lungs daily.  . Vitamin D, Ergocalciferol, (DRISDOL) 50000 UNITS CAPS capsule TAKE 1 CAPSULE (50,000 UNITS TOTAL) BY MOUTH EVERY 7 (SEVEN) DAYS.   No facility-administered encounter medications on file as of 01/04/2016.     Orders for this visit: No orders of the defined types were placed in this encounter.   Thank  you for the visitation and for allowing  [facility] to assist in the care of your patient. Our recommendations are noted above.  Please contact us if we can be of further service.  Vilinda Boehringer, MD [facility] Office Number: [phone]  Note: This note was prepared with Dragon dictation along with smaller phrase technology. Any transcriptional errors that result from this process  are unintentional.

## 2016-01-04 NOTE — Assessment & Plan Note (Signed)
This is probably a second or third episode of COPD exacerbation since May. We have identified a possible trigger of  dust and constant exposure to tobacco products from her mother's house as a possible irritant could be causing her frequent COPD exacerbation. Today it seems like she's have an URI.   PFTs with mild-moderate obstruction  Plan: -Avoidance of allergens - cont with Spiriva and Advair - prednisone 7m x 5 days with breakfast - Zpak

## 2016-01-09 ENCOUNTER — Telehealth: Payer: Self-pay | Admitting: *Deleted

## 2016-01-09 ENCOUNTER — Other Ambulatory Visit: Payer: Self-pay | Admitting: *Deleted

## 2016-01-09 MED ORDER — RABEPRAZOLE SODIUM 20 MG PO TBEC: 20.0000 mg | DELAYED_RELEASE_TABLET | Freq: Every day | ORAL | 1 refills | Status: DC

## 2016-01-09 MED ORDER — OMEPRAZOLE 40 MG PO CPDR: 40.0000 mg | DELAYED_RELEASE_CAPSULE | Freq: Every day | ORAL | 1 refills | Status: DC

## 2016-01-09 NOTE — Telephone Encounter (Signed)
Spoke with pt and VM to inform that insurance states pt must try and fail Omeprazole. Per VM send in Omeprazole 69m daily. Pt informed. RX sent. Nothing further needed.

## 2016-01-09 NOTE — Telephone Encounter (Signed)
Please inform patient of insurance response. Ask her which one she would like, and we can write a script for it.   Thank you

## 2016-01-09 NOTE — Telephone Encounter (Signed)
Spoke with pt and she states she wants the Aciphex. RX sent in. Nothing further needed.

## 2016-01-09 NOTE — Telephone Encounter (Signed)
Received PA request from [place] for Esomeprazole. Called [employer] at [phone] and they have informed me that Nexium is no longer covered as of 10/05/15 and that medications need to be changed to one of the following.  Omeprazole, Pantoprazole or Rabeprazole.  VM please advise on medication change. Thanks.

## 2016-02-16 ENCOUNTER — Encounter: Payer: Self-pay | Admitting: Internal Medicine

## 2016-02-17 ENCOUNTER — Other Ambulatory Visit: Payer: PRIVATE HEALTH INSURANCE

## 2016-02-21 ENCOUNTER — Ambulatory Visit: Payer: PRIVATE HEALTH INSURANCE | Admitting: Internal Medicine

## 2016-02-22 ENCOUNTER — Telehealth: Payer: Self-pay | Admitting: Internal Medicine

## 2016-02-22 NOTE — Telephone Encounter (Signed)
Pt called and stated that she has lost her prescriptions and was wondering if Dr. Nicki Reaper would rewrite them so that she can bring them to [facility]. The medications are buPROPion (WELLBUTRIN XL) 300 MG 24 hr tablet, RABEprazole (ACIPHEX) 20 MG tablet, losartan (COZAAR) 100 MG tablet, metFORMIN (GLUCOPHAGE) 500 MG tablet, amLODipine (NORVASC) 5 MG tablet, furosemide (LASIX) 20 MG tablet, and metoprolol succinate (TOPROL XL) 50 MG 24 hr tablet. Pt would like to pick up on Friday. Please advise, thank you!  Call pt @ [phone]

## 2016-02-22 NOTE — Telephone Encounter (Signed)
Ok to print new prescriptions.  (3 month supply of each with one refill).

## 2016-02-22 NOTE — Telephone Encounter (Signed)
Please advise 

## 2016-02-23 ENCOUNTER — Other Ambulatory Visit: Payer: Self-pay

## 2016-02-23 MED ORDER — FUROSEMIDE 20 MG PO TABS: 20.0000 mg | ORAL_TABLET | Freq: Every day | ORAL | 1 refills | Status: DC

## 2016-02-23 MED ORDER — BUPROPION HCL ER (XL) 300 MG PO TB24: 300.0000 mg | ORAL_TABLET | Freq: Every day | ORAL | 1 refills | Status: DC

## 2016-02-23 MED ORDER — AMLODIPINE BESYLATE 5 MG PO TABS
5.0000 mg | ORAL_TABLET | Freq: Every day | ORAL | 1 refills | Status: DC
Start: 2016-02-23 — End: 2016-08-22

## 2016-02-23 MED ORDER — METOPROLOL SUCCINATE ER 50 MG PO TB24: 50.0000 mg | ORAL_TABLET | Freq: Every day | ORAL | 1 refills | Status: DC

## 2016-02-23 MED ORDER — RABEPRAZOLE SODIUM 20 MG PO TBEC: 20.0000 mg | DELAYED_RELEASE_TABLET | Freq: Every day | ORAL | 1 refills | Status: DC

## 2016-02-23 MED ORDER — METFORMIN HCL 500 MG PO TABS: ORAL_TABLET | ORAL | 1 refills | Status: DC

## 2016-02-23 MED ORDER — LOSARTAN POTASSIUM 100 MG PO TABS: 100.0000 mg | ORAL_TABLET | Freq: Every day | ORAL | 1 refills | Status: DC

## 2016-02-23 NOTE — Progress Notes (Signed)
Printed

## 2016-02-23 NOTE — Telephone Encounter (Signed)
Printed, patient notified

## 2016-02-27 ENCOUNTER — Telehealth: Payer: Self-pay | Admitting: Internal Medicine

## 2016-02-27 ENCOUNTER — Ambulatory Visit
Admission: RE | Admit: 2016-02-27 | Discharge: 2016-02-27 | Disposition: A | Discharge status: Home / Self Care | Payer: PRIVATE HEALTH INSURANCE | Source: Ambulatory Visit | Attending: Internal Medicine | Admitting: Internal Medicine

## 2016-02-27 ENCOUNTER — Other Ambulatory Visit: Payer: Self-pay | Admitting: *Deleted

## 2016-02-27 DIAGNOSIS — R0600 Dyspnea, unspecified: Secondary | ICD-10-CM

## 2016-02-27 DIAGNOSIS — J9811 Atelectasis: Secondary | ICD-10-CM | POA: Insufficient documentation

## 2016-02-27 MED ORDER — CLARITHROMYCIN 500 MG PO TABS: 500.0000 mg | ORAL_TABLET | Freq: Two times a day (BID) | ORAL | 0 refills | Status: DC

## 2016-02-27 MED ORDER — PREDNISONE 10 MG PO TABS: 30.0000 mg | ORAL_TABLET | Freq: Every day | ORAL | 0 refills | Status: DC

## 2016-02-27 NOTE — Telephone Encounter (Signed)
Pt states she feels tightness in her chest, coughing up mucus. Please call.

## 2016-02-27 NOTE — Telephone Encounter (Signed)
2 view CXR  levaquin 54m - 1 tab PO x 10 days Prednisone 367m - 1 tab po x 7 days

## 2016-02-27 NOTE — Telephone Encounter (Signed)
Prod cough w/yellow/brown mucus, chills and has been using nebs and rescue inhaler. Wheezing. Pt is asking if we can send something in. Please advise.

## 2016-02-27 NOTE — Telephone Encounter (Signed)
Pt has allergy to Levaquin so change to Clarithromycin per VM 500 mg BID x 10 days. RX sent and CXR order placed. Pt informed of response. Nothing further needed.

## 2016-03-06 ENCOUNTER — Ambulatory Visit (INDEPENDENT_AMBULATORY_CARE_PROVIDER_SITE_OTHER): Payer: PRIVATE HEALTH INSURANCE | Admitting: Family Medicine

## 2016-03-06 ENCOUNTER — Encounter: Payer: Self-pay | Admitting: Family Medicine

## 2016-03-06 DIAGNOSIS — J441 Chronic obstructive pulmonary disease with (acute) exacerbation: Secondary | ICD-10-CM

## 2016-03-06 MED ORDER — HYDROCOD POLST-CPM POLST ER 10-8 MG/5ML PO SUER: 5.0000 mL | Freq: Two times a day (BID) | ORAL | 0 refills | Status: DC | PRN

## 2016-03-06 NOTE — Assessment & Plan Note (Signed)
Acute exacerbation. Continue Biaxin. Tussionex for cough. Advised warm salt gargles and antihistamine for sore throat.

## 2016-03-06 NOTE — Patient Instructions (Signed)
Finish the antibiotic.  Tussionex for cough.  Warm salt water gargles and an OTC antihistamine for sore throat.  Take care  Dr. Lacinda Axon

## 2016-03-06 NOTE — Progress Notes (Signed)
Pre visit review using our clinic review tool, if applicable. No additional management support is needed unless otherwise documented below in the visit note. 

## 2016-03-06 NOTE — Progress Notes (Signed)
Subjective:  [name], female    DOB: 05-Dec-1955  Age: 60 y.o. MRN: [id number]  CC: Cough, ST, congestion  HPI:  60 year old female with COPD presents with the above complaints.  Patient states she's been sick for approximately 10 days. She called her pulmonology office and was placed on Biaxin and prednisone for COPD exacerbation. Chest x-ray was negative.  Patient resents today with continued complaints of cough, sore throat, and congestion. She states that she has a 3 days left on her antibiotic. She continues to have a mildly productive cough. Her wheezing that she had initially has improved. She reports that she has severe sore throat and associated sinus congestion. This is not improved with antibiotic treatment. No other medications or interventions tried. No other complaints or concerns at this time.  Social Hx   Social History   Social History  . Marital status: Married    Spouse name: N/A  . Number of children: 2  . Years of education: N/A   Occupational History  .  [employer]   Social History Main Topics  . Smoking status: Former Smoker    Packs/day: 2.00    Years: 15.00    Quit date: 04/09/1994  . Smokeless tobacco: Never Used     Comment: QUIT IN 1990'S  . Alcohol use 0.0 oz/week     Comment: OCCASIONALLY  . Drug use: No  . Sexual activity: Not Asked   Other Topics Concern  . None   Social History Narrative  . None    Review of Systems  HENT: Positive for congestion and sore throat.   Respiratory: Positive for cough.    Objective:  BP 134/78   Pulse 92   Temp 97.9 F (36.6 C) (Oral)   Wt 222 lb 6.4 oz (100.9 kg)   SpO2 95%   BMI 36.45 kg/m   BP/Weight 03/06/2016 9/48/5462 7/0/3500  Systolic BP 938 182 993  Diastolic BP 78 68 60  Wt. (Lbs) 222.4 222.2 220.25  BMI 36.45 36.41 37.51    Physical Exam  Constitutional: She is oriented to person, place, and time. No distress.  HENT:  Oropharynx with mild erythema.  Postnasal drip.  Cardiovascular: Normal rate and regular rhythm.   Pulmonary/Chest: Effort normal and breath sounds normal.  Neurological: She is alert and oriented to person, place, and time.  Psychiatric: She has a normal mood and affect.  Vitals reviewed.  Lab Results  Component Value Date   WBC 7.3 10/19/2015   HGB 16.1 Repeated and verified X2. (H) 10/19/2015   HCT 47.7 (H) 10/19/2015   PLT 159.0 10/19/2015   GLUCOSE 152 (H) 10/19/2015   CHOL 188 10/19/2015   TRIG 303.0 (H) 10/19/2015   HDL 39.80 10/19/2015   LDLDIRECT 114.0 10/19/2015   LDLCALC 148 (H) 07/24/2013   ALT 20 10/19/2015   AST 15 10/19/2015   NA 139 10/19/2015   K 4.3 10/19/2015   CL 103 10/19/2015   CREATININE 0.77 10/19/2015   BUN 13 10/19/2015   CO2 26 10/19/2015   TSH 1.53 12/23/2014   HGBA1C 7.3 (H) 10/19/2015   MICROALBUR <0.7 12/23/2014    Assessment & Plan:   Problem List Items Addressed This Visit    COPD exacerbation ([place])    Acute exacerbation. Continue Biaxin. Tussionex for cough. Advised warm salt gargles and antihistamine for sore throat.      Relevant Medications   chlorpheniramine-HYDROcodone (TUSSIONEX PENNKINETIC ER) 10-8 MG/5ML SUER      Meds  ordered this encounter  Medications  . chlorpheniramine-HYDROcodone (TUSSIONEX PENNKINETIC ER) 10-8 MG/5ML SUER    Sig: Take 5 mLs by mouth every 12 (twelve) hours as needed.    Dispense:  115 mL    Refill:  0    Follow-up: PRN  [place]

## 2016-05-11 ENCOUNTER — Telehealth: Payer: Self-pay | Admitting: Internal Medicine

## 2016-05-11 MED ORDER — OSELTAMIVIR PHOSPHATE 75 MG PO CAPS: 75.0000 mg | ORAL_CAPSULE | Freq: Every day | ORAL | 0 refills | Status: DC

## 2016-05-11 NOTE — Telephone Encounter (Signed)
Pt called and states that her spouse just came back from the walk in and was dx with the flu. Pt is wondering if she needs to start Tamiflu. Please advise, thank you!  Call pt @ [phone] (until 4:30) [phone]  Pharmacy - [id number] - [place], [place]

## 2016-05-11 NOTE — Telephone Encounter (Signed)
Patient advised of below  

## 2016-05-11 NOTE — Telephone Encounter (Signed)
Yes, I recommend taking tamiflu for ten days and sent rx to [facility]

## 2016-05-15 ENCOUNTER — Ambulatory Visit (INDEPENDENT_AMBULATORY_CARE_PROVIDER_SITE_OTHER): Payer: PRIVATE HEALTH INSURANCE

## 2016-05-15 ENCOUNTER — Encounter: Payer: Self-pay | Admitting: Internal Medicine

## 2016-05-15 ENCOUNTER — Ambulatory Visit (INDEPENDENT_AMBULATORY_CARE_PROVIDER_SITE_OTHER): Payer: PRIVATE HEALTH INSURANCE | Admitting: Internal Medicine

## 2016-05-15 VITALS — BP 110/60 | HR 77 | Temp 98.6°F | Ht 66.0 in | Wt 224.8 lb

## 2016-05-15 DIAGNOSIS — E7800 Pure hypercholesterolemia, unspecified: Secondary | ICD-10-CM

## 2016-05-15 DIAGNOSIS — I10 Essential (primary) hypertension: Secondary | ICD-10-CM | POA: Diagnosis not present

## 2016-05-15 DIAGNOSIS — R05 Cough: Secondary | ICD-10-CM

## 2016-05-15 DIAGNOSIS — R062 Wheezing: Secondary | ICD-10-CM | POA: Diagnosis not present

## 2016-05-15 DIAGNOSIS — E669 Obesity, unspecified: Secondary | ICD-10-CM

## 2016-05-15 DIAGNOSIS — E119 Type 2 diabetes mellitus without complications: Secondary | ICD-10-CM | POA: Diagnosis not present

## 2016-05-15 DIAGNOSIS — K5090 Crohn's disease, unspecified, without complications: Secondary | ICD-10-CM | POA: Diagnosis not present

## 2016-05-15 DIAGNOSIS — E559 Vitamin D deficiency, unspecified: Secondary | ICD-10-CM | POA: Diagnosis not present

## 2016-05-15 DIAGNOSIS — F418 Other specified anxiety disorders: Secondary | ICD-10-CM | POA: Diagnosis not present

## 2016-05-15 DIAGNOSIS — K219 Gastro-esophageal reflux disease without esophagitis: Secondary | ICD-10-CM

## 2016-05-15 DIAGNOSIS — J441 Chronic obstructive pulmonary disease with (acute) exacerbation: Secondary | ICD-10-CM | POA: Diagnosis not present

## 2016-05-15 DIAGNOSIS — F32A Depression, unspecified: Secondary | ICD-10-CM

## 2016-05-15 DIAGNOSIS — R059 Cough, unspecified: Secondary | ICD-10-CM

## 2016-05-15 DIAGNOSIS — F329 Major depressive disorder, single episode, unspecified: Secondary | ICD-10-CM

## 2016-05-15 DIAGNOSIS — F419 Anxiety disorder, unspecified: Secondary | ICD-10-CM

## 2016-05-15 MED ORDER — PREDNISONE 10 MG PO TABS: ORAL_TABLET | ORAL | 0 refills | Status: DC

## 2016-05-15 MED ORDER — RABEPRAZOLE SODIUM 20 MG PO TBEC: 20.0000 mg | DELAYED_RELEASE_TABLET | Freq: Two times a day (BID) | ORAL | 1 refills | Status: DC

## 2016-05-15 NOTE — Progress Notes (Signed)
Pre-visit discussion using our clinic review tool. No additional management support is needed unless otherwise documented below in the visit note.  

## 2016-05-15 NOTE — Progress Notes (Signed)
[name], female   DOB: September 26, 1955, 61 y.o.   MRN: [id number]   Subjective:    [name], female    DOB: 02/21/1956, 61 y.o.   MRN: [id number]  HPI  Patient here for a scheduled follow up.  States she is overall doing better with the increased stress.  Steal trying to deal with her mother and her brother's deaths.  Overall doing better.  She does report some increased cough, congestion and wheezing.  No fever.  Using spiriva and advair.  Has f/u with pulmonary next month.  Some increased acid reflux as well.  On aciphex.  No nausea or vomiting.  No abdominal pain or cramping.  Bowels stable. Blood sugars have been doing well - 130s.     Past Medical History:  Diagnosis Date  . COPD (chronic obstructive pulmonary disease) ([place])   . Crohn's disease ([place])   . Depression   . Diabetes mellitus ([place])   . Hypercholesterolemia   . Hypertension   . IBS (irritable bowel syndrome)   . Nephrolithiasis    s/p lithotripsy [facility])   Past Surgical History:  Procedure Laterality Date  . ABDOMINAL HYSTERECTOMY  1985  . APPENDECTOMY  1985  . BREAST BIOPSY  2013  . LAPAROSCOPIC CHOLECYSTECTOMY  2005   Dr Tamala Julian  . LAPAROSCOPIC SUPRACERVICAL HYSTERECTOMY  1985   left ovary not removed  . LITHOTRIPSY     x7  . POLYPECTOMY    . SHOULDER SURGERY     right (pinning)  . SHOULDER SURGERY     left (pinning)  . TONSILLECTOMY  1966   Family History  Problem Relation Age of Onset  . Heart disease Father   . Emphysema Maternal Grandmother   . COPD Mother   . Hypercholesterolemia Mother   . Emphysema Mother   . Hypertension Brother   . Emphysema      grandmother  . Breast cancer Neg Hx   . Colon cancer Neg Hx    Social History   Social History  . Marital status: Married    Spouse name: N/A  . Number of children: 2  . Years of education: N/A   Occupational History  .  [employer]   Social History Main Topics  . Smoking status:  Former Smoker    Packs/day: 2.00    Years: 15.00    Quit date: 04/09/1994  . Smokeless tobacco: Never Used     Comment: QUIT IN 1990'S  . Alcohol use 0.0 oz/week     Comment: OCCASIONALLY  . Drug use: No  . Sexual activity: Not Asked   Other Topics Concern  . None   Social History Narrative  . None    Outpatient Encounter Prescriptions as of 05/15/2016  Medication Sig  . Albiglutide (TANZEUM) 30 MG PEN Inject 50 mg into the skin. Take every Tuesday  . albuterol (PROVENTIL HFA;VENTOLIN HFA) 108 (90 Base) MCG/ACT inhaler Inhale 2 puffs into the lungs every 6 (six) hours as needed.  . ALPRAZolam (XANAX) 0.25 MG tablet Take 1 tablet (0.25 mg total) by mouth 2 (two) times daily as needed for anxiety.  Marland Kitchen amLODipine (NORVASC) 5 MG tablet Take 1 tablet (5 mg total) by mouth daily.  Marland Kitchen buPROPion (WELLBUTRIN XL) 300 MG 24 hr tablet Take 1 tablet (300 mg total) by mouth daily.  . chlorpheniramine-HYDROcodone (TUSSIONEX PENNKINETIC ER) 10-8 MG/5ML SUER Take 5 mLs by mouth every 12 (twelve) hours as needed.  . clarithromycin (BIAXIN)  500 MG tablet Take 1 tablet (500 mg total) by mouth 2 (two) times daily.  . empagliflozin (JARDIANCE) 25 MG TABS tablet Take 25 mg by mouth daily.  . fluticasone (FLONASE) 50 MCG/ACT nasal spray Place 2 sprays into both nostrils daily.  . fluticasone-salmeterol (ADVAIR HFA) 230-21 MCG/ACT inhaler Inhale 2 puffs into the lungs 2 (two) times daily.  . furosemide (LASIX) 20 MG tablet Take 1 tablet (20 mg total) by mouth daily.  . hyoscyamine (LEVSIN SL) 0.125 MG SL tablet Place 0.125 mg under the tongue every 6 (six) hours as needed.  Marland Kitchen ipratropium-albuterol (DUONEB) 0.5-2.5 (3) MG/3ML SOLN TAKE 3 MLS BY NEBULIZATION EVERY 8 (EIGHT) HOURS AS NEEDED.  Marland Kitchen losartan (COZAAR) 100 MG tablet Take 1 tablet (100 mg total) by mouth daily.  . metFORMIN (GLUCOPHAGE) 500 MG tablet 2 tablets bid  . metoprolol succinate (TOPROL XL) 50 MG 24 hr tablet Take 1 tablet (50 mg total) by mouth  daily. Take with or immediately following a meal.  . montelukast (SINGULAIR) 10 MG tablet Take 1 tablet (10 mg total) by mouth daily.  Marland Kitchen omeprazole (PRILOSEC) 40 MG capsule Take 1 capsule (40 mg total) by mouth daily.  Marland Kitchen oseltamivir (TAMIFLU) 75 MG capsule Take 1 capsule (75 mg total) by mouth daily.  . RABEprazole (ACIPHEX) 20 MG tablet Take 1 tablet (20 mg total) by mouth 2 (two) times daily before a meal.  . Tiotropium Bromide Monohydrate (SPIRIVA RESPIMAT) 2.5 MCG/ACT AERS Inhale 2 puffs into the lungs daily.  . Vitamin D, Ergocalciferol, (DRISDOL) 50000 UNITS CAPS capsule TAKE 1 CAPSULE (50,000 UNITS TOTAL) BY MOUTH EVERY 7 (SEVEN) DAYS.  . [DISCONTINUED] RABEprazole (ACIPHEX) 20 MG tablet Take 1 tablet (20 mg total) by mouth daily.  . [DISCONTINUED] RABEprazole Sodium (ACIPHEX PO) Take 40 mg by mouth daily.  . predniSONE (DELTASONE) 10 MG tablet Take 6 tablets x 1 day and then decrease by 1/2 tablet per day until down to zero mg.   No facility-administered encounter medications on file as of 05/15/2016.     Review of Systems  Constitutional: Negative for appetite change and unexpected weight change.  HENT: Positive for congestion. Negative for sinus pressure.   Respiratory: Positive for cough and wheezing. Negative for chest tightness and shortness of breath.   Cardiovascular: Negative for chest pain, palpitations and leg swelling.  Gastrointestinal: Negative for abdominal pain, diarrhea, nausea and vomiting.  Genitourinary: Negative for difficulty urinating and dysuria.  Musculoskeletal: Negative for back pain and joint swelling.  Skin: Negative for color change and rash.  Neurological: Negative for dizziness, light-headedness and headaches.  Psychiatric/Behavioral: Negative for agitation and dysphoric mood.       Objective:    Physical Exam  Constitutional: She appears well-developed and well-nourished. No distress.  HENT:  Nose: Nose normal.  Mouth/Throat: Oropharynx is clear  and moist.  Neck: Neck supple. No thyromegaly present.  Cardiovascular: Normal rate and regular rhythm.   Pulmonary/Chest: Breath sounds normal. No respiratory distress.  Increased cough with forced expiration.    Abdominal: Soft. Bowel sounds are normal. There is no tenderness.  Musculoskeletal: She exhibits no edema or tenderness.  Lymphadenopathy:    She has no cervical adenopathy.  Skin: No rash noted. No erythema.  Psychiatric: She has a normal mood and affect. Her behavior is normal.    BP 110/60 (BP Location: Left Arm, Patient Position: Sitting, Cuff Size: Large)   Pulse 77   Temp 98.6 F (37 C) (Oral)   Ht 5' 6"  (1.676  m)   Wt 224 lb 12.8 oz (102 kg)   SpO2 95%   BMI 36.28 kg/m  Wt Readings from Last 3 Encounters:  05/15/16 224 lb 12.8 oz (102 kg)  03/06/16 222 lb 6.4 oz (100.9 kg)  01/04/16 222 lb 3.2 oz (100.8 kg)     Lab Results  Component Value Date   WBC 7.3 10/19/2015   HGB 16.1 Repeated and verified X2. (H) 10/19/2015   HCT 47.7 (H) 10/19/2015   PLT 159.0 10/19/2015   GLUCOSE 152 (H) 10/19/2015   CHOL 188 10/19/2015   TRIG 303.0 (H) 10/19/2015   HDL 39.80 10/19/2015   LDLDIRECT 114.0 10/19/2015   LDLCALC 148 (H) 07/24/2013   ALT 20 10/19/2015   AST 15 10/19/2015   NA 139 10/19/2015   K 4.3 10/19/2015   CL 103 10/19/2015   CREATININE 0.77 10/19/2015   BUN 13 10/19/2015   CO2 26 10/19/2015   TSH 1.53 12/23/2014   HGBA1C 7.3 (H) 10/19/2015   MICROALBUR <0.7 12/23/2014    Dg Chest 2 View  Result Date: 02/28/2016 CLINICAL DATA:  Worsening cough. EXAM: CHEST  2 VIEW COMPARISON:  10/21/2015. FINDINGS: Mediastinum and hilar structures are normal. Left base subsegmental atelectasis. No pleural effusion or pneumothorax. Degenerative changes thoracic spine. Postsurgical changes right shoulder. IMPRESSION: Mild left base subsegmental atelectasis. Electronically Signed   By: Marcello Moores  Register   On: 02/28/2016 07:11       Assessment & Plan:   Problem  List Items Addressed This Visit    Anxiety and depression    Doing better overall.  Continue current medication regimen.  Follow.       COPD exacerbation ([place])    Some increased cough and wheezing.  Will treat with prednisone taper as directed.  Continue spiriva and advair.  Follow.  Follow sugars on prednisone.  Check cxr.       Relevant Medications   predniSONE (DELTASONE) 10 MG tablet   Crohn's disease ([place])    Bowels stable.  Continue f/u with GI.       Diabetes mellitus ([place])    Low carb diet and exercise.  Follow met b and a1c.  Sugars as outlined.        GERD (gastroesophageal reflux disease)    Increased acid reflux.  Increase aciphex to bid.  Follow.        Relevant Medications   RABEprazole (ACIPHEX) 20 MG tablet   Hypercholesterolemia without hypertriglyceridemia    Low cholesterol diet and exercise.  Follow lipid panel.        Hypertension    Blood pressure under good control.  Continue same medication regimen.  Follow pressures.  Follow metabolic panel.        Obesity (BMI 30-39.9)    Diet and exercise.  Follow.       Vitamin D deficiency    Continue vitamin D supplements.        Other Visit Diagnoses    Cough    -  Primary   Relevant Orders   DG Chest 2 View (Completed)   Wheezing       Relevant Orders   DG Chest 2 View (Completed)       Einar Pheasant, MD

## 2016-05-21 ENCOUNTER — Encounter: Payer: Self-pay | Admitting: Internal Medicine

## 2016-05-21 NOTE — Assessment & Plan Note (Signed)
Low carb diet and exercise.  Follow met b and a1c.  Sugars as outlined.   

## 2016-05-21 NOTE — Assessment & Plan Note (Signed)
Bowels stable.  Continue f/u with GI.

## 2016-05-21 NOTE — Assessment & Plan Note (Signed)
Doing better overall.  Continue current medication regimen.  Follow.

## 2016-05-21 NOTE — Assessment & Plan Note (Signed)
Diet and exercise.  Follow.  

## 2016-05-21 NOTE — Assessment & Plan Note (Signed)
Increased acid reflux.  Increase aciphex to bid.  Follow.

## 2016-05-21 NOTE — Assessment & Plan Note (Addendum)
Low cholesterol diet and exercise.  Follow lipid panel.

## 2016-05-21 NOTE — Assessment & Plan Note (Addendum)
Some increased cough and wheezing.  Will treat with prednisone taper as directed.  Continue spiriva and advair.  Follow.  Follow sugars on prednisone.  Check cxr.

## 2016-05-21 NOTE — Assessment & Plan Note (Signed)
Blood pressure under good control.  Continue same medication regimen.  Follow pressures.  Follow metabolic panel.   

## 2016-05-21 NOTE — Assessment & Plan Note (Signed)
Continue vitamin D supplements.

## 2016-05-23 ENCOUNTER — Other Ambulatory Visit: Payer: Self-pay | Admitting: Internal Medicine

## 2016-05-23 MED ORDER — AZITHROMYCIN 250 MG PO TABS: ORAL_TABLET | ORAL | 0 refills | Status: DC

## 2016-05-23 NOTE — Progress Notes (Signed)
rx sent in for zpak.   

## 2016-06-08 ENCOUNTER — Ambulatory Visit: Payer: PRIVATE HEALTH INSURANCE | Admitting: Internal Medicine

## 2016-06-26 ENCOUNTER — Encounter: Payer: Self-pay | Admitting: Internal Medicine

## 2016-06-26 ENCOUNTER — Ambulatory Visit (INDEPENDENT_AMBULATORY_CARE_PROVIDER_SITE_OTHER): Payer: PRIVATE HEALTH INSURANCE | Admitting: Internal Medicine

## 2016-06-26 VITALS — BP 120/68 | HR 88 | Ht 65.5 in | Wt 222.2 lb

## 2016-06-26 DIAGNOSIS — J449 Chronic obstructive pulmonary disease, unspecified: Secondary | ICD-10-CM | POA: Diagnosis not present

## 2016-06-26 MED ORDER — TIOTROPIUM BROMIDE MONOHYDRATE 2.5 MCG/ACT IN AERS: 2.0000 | INHALATION_SPRAY | Freq: Every day | RESPIRATORY_TRACT | 0 refills | Status: DC

## 2016-06-26 NOTE — Progress Notes (Signed)
[facility] Consultation      MRN# [id number] KALLI GREENFIELD 1955-10-24   CC: Chief Complaint  Patient presents with  . Follow-up    61morov. pt states breathing is doing well. pt reports of mild prod with green mucus & mild wheezing       Brief History: 61yo F History of COPD, Crohn's disease, seeing [facility] for COPD optimization. Now on Advair, Spiriva, allergy control.   CC follow up COPD HPI Presents today for a follow up visit.  No acute issues SOB at baseline Former smoker Last COPD exacerbation was 05/2016 Good improvement overall with Spiriva.   No signs of infection at this time  Current Outpatient Prescriptions:  .  Albiglutide (TANZEUM) 30 MG PEN, Inject 50 mg into the skin. Take every Tuesday, Disp: , Rfl:  .  albuterol (PROVENTIL HFA;VENTOLIN HFA) 108 (90 Base) MCG/ACT inhaler, Inhale 2 puffs into the lungs every 6 (six) hours as needed., Disp: 54 g, Rfl: 3 .  amLODipine (NORVASC) 5 MG tablet, Take 1 tablet (5 mg total) by mouth daily., Disp: 90 tablet, Rfl: 1 .  buPROPion (WELLBUTRIN XL) 300 MG 24 hr tablet, Take 1 tablet (300 mg total) by mouth daily., Disp: 90 tablet, Rfl: 1 .  empagliflozin (JARDIANCE) 25 MG TABS tablet, Take 25 mg by mouth daily., Disp: , Rfl:  .  fluticasone (FLONASE) 50 MCG/ACT nasal spray, Place 2 sprays into both nostrils daily., Disp: 48 g, Rfl: 3 .  fluticasone-salmeterol (ADVAIR HFA) 230-21 MCG/ACT inhaler, Inhale 2 puffs into the lungs 2 (two) times daily., Disp: 3 Inhaler, Rfl: 3 .  furosemide (LASIX) 20 MG tablet, Take 1 tablet (20 mg total) by mouth daily., Disp: 90 tablet, Rfl: 1 .  hyoscyamine (LEVSIN SL) 0.125 MG SL tablet, Place 0.125 mg under the tongue every 6 (six) hours as needed., Disp: , Rfl:  .  ipratropium-albuterol (DUONEB) 0.5-2.5 (3) MG/3ML SOLN, TAKE 3 MLS BY NEBULIZATION EVERY 8 (EIGHT) HOURS AS NEEDED., Disp: 360 mL, Rfl: 1 .  losartan (COZAAR) 100 MG tablet, Take 1 tablet (100 mg  total) by mouth daily., Disp: 90 tablet, Rfl: 1 .  metFORMIN (GLUCOPHAGE) 500 MG tablet, 2 tablets bid, Disp: 360 tablet, Rfl: 1 .  metoprolol succinate (TOPROL XL) 50 MG 24 hr tablet, Take 1 tablet (50 mg total) by mouth daily. Take with or immediately following a meal., Disp: 90 tablet, Rfl: 1 .  montelukast (SINGULAIR) 10 MG tablet, Take 1 tablet (10 mg total) by mouth daily., Disp: 90 tablet, Rfl: 3 .  RABEprazole (ACIPHEX) 20 MG tablet, Take 1 tablet (20 mg total) by mouth 2 (two) times daily before a meal., Disp: 180 tablet, Rfl: 1 .  Tiotropium Bromide Monohydrate (SPIRIVA RESPIMAT) 2.5 MCG/ACT AERS, Inhale 2 puffs into the lungs daily., Disp: 3 Inhaler, Rfl: 3 .  Vitamin D, Ergocalciferol, (DRISDOL) 50000 UNITS CAPS capsule, TAKE 1 CAPSULE (50,000 UNITS TOTAL) BY MOUTH EVERY 7 (SEVEN) DAYS., Disp: 8 capsule, Rfl: 2   Review of Systems  Constitutional: Negative for chills and fever.  HENT: Negative for congestion and sore throat.   Respiratory: Negative for cough, hemoptysis, sputum production, shortness of breath and wheezing.   Gastrointestinal: Negative for heartburn, nausea and vomiting.  Genitourinary: Negative for dysuria.  Musculoskeletal: Negative for myalgias.      Allergies:  Avelox [moxifloxacin hcl in nacl]; Moxifloxacin; Allegra [fexofenadine hcl]; Sulfa antibiotics; Tegretol [carbamazepine]; Iodine; Ioxaglate; and Ivp dye [iodinated diagnostic agents]  Physical Examination:  VS: BP 120/68 (BP Location: Left Arm, Cuff Size: Normal)   Pulse 88   Ht 5' 5.5" (1.664 m)   Wt 222 lb 3.2 oz (100.8 kg)   SpO2 93%   BMI 36.41 kg/m   General Appearance: No distress  HEENT: PERRLA, no ptosis, no other lesions noticed Pulmonary:mild upper lobe expiratory wheezes, no crackles, good airway entry Cardiovascular:  Normal S1,S2.  No m/r/g.     Abdomen:Exam: Benign, Soft, non-tender, No masses  Skin:   warm, no rashes, no ecchymosis  Extremities: normal, no cyanosis, clubbing,  warm with normal capillary refill.    Pulmonary function testing 12/22/2015 FEV1 to 69% FEV1/FVC 73% RV 72 TLC 76 ERV 27 DLCO corrected 114 Flow loops within next expiratory scooping Impression: Mild obstruction, clinical correlation advised. Significant reduction in ERV, could be related to abdominal obesity, clinical correlation advised.   6 minute walk test: Total distance 1102 feet, 336 m, low saturation 94%, highest heart rate 98.   Assessment and Plan:61 yo with Mild/Moderate COPD gold stage A 1.no need for systemic steroids at this time 2.continue advair and spiriva 3.albuterol as needed 4.check ONO   Patient  satisfied with Plan of action and management. All questions answered  Corrin Parker, M.D.  [facility]  Medical Director [facility] Director [facility] Cardio[facility] Department

## 2016-06-26 NOTE — Patient Instructions (Signed)
Continue inhalers as prescribed Check ONO

## 2016-06-27 ENCOUNTER — Encounter: Payer: Self-pay | Admitting: Internal Medicine

## 2016-06-27 DIAGNOSIS — J449 Chronic obstructive pulmonary disease, unspecified: Secondary | ICD-10-CM

## 2016-07-09 ENCOUNTER — Telehealth: Payer: Self-pay | Admitting: *Deleted

## 2016-07-09 DIAGNOSIS — J449 Chronic obstructive pulmonary disease, unspecified: Secondary | ICD-10-CM

## 2016-07-09 NOTE — Telephone Encounter (Signed)
Pt informed of the need for O2 @2L  [facility]. Order placed. Nothing further needed.

## 2016-08-17 ENCOUNTER — Ambulatory Visit (INDEPENDENT_AMBULATORY_CARE_PROVIDER_SITE_OTHER): Payer: PRIVATE HEALTH INSURANCE | Admitting: Internal Medicine

## 2016-08-17 ENCOUNTER — Encounter: Payer: Self-pay | Admitting: Internal Medicine

## 2016-08-17 VITALS — BP 120/62 | HR 82 | Temp 98.6°F | Resp 12 | Ht 66.0 in | Wt 222.0 lb

## 2016-08-17 DIAGNOSIS — K219 Gastro-esophageal reflux disease without esophagitis: Secondary | ICD-10-CM

## 2016-08-17 DIAGNOSIS — I10 Essential (primary) hypertension: Secondary | ICD-10-CM | POA: Diagnosis not present

## 2016-08-17 DIAGNOSIS — F419 Anxiety disorder, unspecified: Secondary | ICD-10-CM | POA: Diagnosis not present

## 2016-08-17 DIAGNOSIS — M25531 Pain in right wrist: Secondary | ICD-10-CM | POA: Diagnosis not present

## 2016-08-17 DIAGNOSIS — E669 Obesity, unspecified: Secondary | ICD-10-CM | POA: Diagnosis not present

## 2016-08-17 DIAGNOSIS — E559 Vitamin D deficiency, unspecified: Secondary | ICD-10-CM

## 2016-08-17 DIAGNOSIS — J4520 Mild intermittent asthma, uncomplicated: Secondary | ICD-10-CM

## 2016-08-17 DIAGNOSIS — E7800 Pure hypercholesterolemia, unspecified: Secondary | ICD-10-CM | POA: Diagnosis not present

## 2016-08-17 DIAGNOSIS — K50919 Crohn's disease, unspecified, with unspecified complications: Secondary | ICD-10-CM

## 2016-08-17 DIAGNOSIS — E119 Type 2 diabetes mellitus without complications: Secondary | ICD-10-CM

## 2016-08-17 DIAGNOSIS — F329 Major depressive disorder, single episode, unspecified: Secondary | ICD-10-CM

## 2016-08-17 DIAGNOSIS — F32A Depression, unspecified: Secondary | ICD-10-CM

## 2016-08-17 NOTE — Progress Notes (Signed)
[name], female   DOB: 10-13-1955, 61 y.o.   MRN: [id number]   Subjective:    [name], female    DOB: Aug 14, 1955, 61 y.o.   MRN: [id number]  HPI  Patient here for a scheduled follow up.  She reports she is doing better overall.  She has sold her mother's house. Still trying to cope with her death and the unexpected death of her brother.  Overall doing better.  No chest pain.  Breathing overall has been stable.  She does report some cough and some wheezing recently.  Mild.  Using her advair and spiriva regularly.  Helps.  Using rescue inhaler.  No significant chest tightness.  No fever.  No acid reflux.  No abdominal pain.  Bowels moving.  Does report some right wrist discomfort.  No injury or trauma.  No swelling or redness.  Using her hand a lot - keying in numbers.     Past Medical History:  Diagnosis Date  . COPD (chronic obstructive pulmonary disease) ([place])   . Crohn's disease ([place])   . Depression   . Diabetes mellitus ([place])   . Hypercholesterolemia   . Hypertension   . IBS (irritable bowel syndrome)   . Nephrolithiasis    s/p lithotripsy [facility])   Past Surgical History:  Procedure Laterality Date  . ABDOMINAL HYSTERECTOMY  1985  . APPENDECTOMY  1985  . BREAST BIOPSY  2013  . LAPAROSCOPIC CHOLECYSTECTOMY  2005   Dr Tamala Julian  . LAPAROSCOPIC SUPRACERVICAL HYSTERECTOMY  1985   left ovary not removed  . LITHOTRIPSY     x7  . POLYPECTOMY    . SHOULDER SURGERY     right (pinning)  . SHOULDER SURGERY     left (pinning)  . TONSILLECTOMY  1966   Family History  Problem Relation Age of Onset  . Heart disease Father   . Emphysema Maternal Grandmother   . COPD Mother   . Hypercholesterolemia Mother   . Emphysema Mother   . Hypertension Brother   . Emphysema Unknown        grandmother  . Breast cancer Neg Hx   . Colon cancer Neg Hx    Social History   Social History  . Marital status: Married    Spouse name: N/A  .  Number of children: 2  . Years of education: N/A   Occupational History  .  [employer]   Social History Main Topics  . Smoking status: Former Smoker    Packs/day: 2.00    Years: 15.00    Quit date: 04/09/1994  . Smokeless tobacco: Never Used     Comment: QUIT IN 1990'S  . Alcohol use 0.0 oz/week     Comment: OCCASIONALLY  . Drug use: No  . Sexual activity: Not Asked   Other Topics Concern  . None   Social History Narrative  . None    Outpatient Encounter Prescriptions as of 08/17/2016  Medication Sig  . Albiglutide (TANZEUM) 30 MG PEN Inject 50 mg into the skin. Take every Tuesday  . albuterol (PROVENTIL HFA;VENTOLIN HFA) 108 (90 Base) MCG/ACT inhaler Inhale 2 puffs into the lungs every 6 (six) hours as needed.  Marland Kitchen amLODipine (NORVASC) 5 MG tablet Take 1 tablet (5 mg total) by mouth daily.  Marland Kitchen buPROPion (WELLBUTRIN XL) 300 MG 24 hr tablet Take 1 tablet (300 mg total) by mouth daily.  . empagliflozin (JARDIANCE) 25 MG TABS tablet Take 25 mg by mouth daily.  Marland Kitchen  fluticasone (FLONASE) 50 MCG/ACT nasal spray Place 2 sprays into both nostrils daily.  . fluticasone-salmeterol (ADVAIR HFA) 230-21 MCG/ACT inhaler Inhale 2 puffs into the lungs 2 (two) times daily.  . furosemide (LASIX) 20 MG tablet Take 1 tablet (20 mg total) by mouth daily.  . hyoscyamine (LEVSIN SL) 0.125 MG SL tablet Place 0.125 mg under the tongue every 6 (six) hours as needed.  Marland Kitchen ipratropium-albuterol (DUONEB) 0.5-2.5 (3) MG/3ML SOLN TAKE 3 MLS BY NEBULIZATION EVERY 8 (EIGHT) HOURS AS NEEDED.  Marland Kitchen losartan (COZAAR) 100 MG tablet Take 1 tablet (100 mg total) by mouth daily.  . metFORMIN (GLUCOPHAGE) 500 MG tablet 2 tablets bid  . metoprolol succinate (TOPROL XL) 50 MG 24 hr tablet Take 1 tablet (50 mg total) by mouth daily. Take with or immediately following a meal.  . montelukast (SINGULAIR) 10 MG tablet Take 1 tablet (10 mg total) by mouth daily.  . RABEprazole (ACIPHEX) 20 MG tablet Take 1 tablet (20 mg total) by  mouth 2 (two) times daily before a meal.  . Tiotropium Bromide Monohydrate (SPIRIVA RESPIMAT) 2.5 MCG/ACT AERS Inhale 2 puffs into the lungs daily.  . Vitamin D, Ergocalciferol, (DRISDOL) 50000 UNITS CAPS capsule TAKE 1 CAPSULE (50,000 UNITS TOTAL) BY MOUTH EVERY 7 (SEVEN) DAYS.  . Tiotropium Bromide Monohydrate (SPIRIVA RESPIMAT) 2.5 MCG/ACT AERS Inhale 2 puffs into the lungs daily.   No facility-administered encounter medications on file as of 08/17/2016.     Review of Systems  Constitutional: Negative for appetite change and unexpected weight change.  HENT: Negative for congestion and sinus pressure.   Respiratory: Positive for cough and wheezing. Negative for chest tightness and shortness of breath.   Cardiovascular: Negative for chest pain, palpitations and leg swelling.  Gastrointestinal: Negative for abdominal pain, diarrhea, nausea and vomiting.  Genitourinary: Negative for difficulty urinating and dysuria.  Musculoskeletal: Negative for back pain and joint swelling.  Skin: Negative for color change and rash.  Neurological: Negative for dizziness, light-headedness and headaches.  Psychiatric/Behavioral: Negative for agitation and dysphoric mood.       Objective:    Physical Exam  Constitutional: She appears well-developed and well-nourished. No distress.  HENT:  Nose: Nose normal.  Mouth/Throat: Oropharynx is clear and moist.  Neck: Neck supple. No thyromegaly present.  Cardiovascular: Normal rate and regular rhythm.   Pulmonary/Chest: Breath sounds normal. No respiratory distress. She has no wheezes.  Abdominal: Soft. Bowel sounds are normal. There is no tenderness.  Musculoskeletal: She exhibits no edema or tenderness.  No increased pain with flexion and extension of her wrist.  Some pain with gripping.  Some pain to palpation around the elbow.  Some discomfort with rotation of her forearm.   Lymphadenopathy:    She has no cervical adenopathy.  Skin: No rash noted. No  erythema.  Psychiatric: She has a normal mood and affect. Her behavior is normal.    BP 120/62 (BP Location: Left Arm, Patient Position: Sitting, Cuff Size: Large)   Pulse 82   Temp 98.6 F (37 C) (Oral)   Resp 12   Ht _0  (1.676 m)   Wt 222 lb (100.7 kg)   SpO2 95%   BMI 35.83 kg/m  Wt Readings from Last 3 Encounters:  08/17/16 222 lb (100.7 kg)  06/26/16 222 lb 3.2 oz (100.8 kg)  05/15/16 224 lb 12.8 oz (102 kg)     Lab Results  Component Value Date   WBC 7.3 10/19/2015   HGB 16.1 Repeated and verified X2. (H)  10/19/2015   HCT 47.7 (H) 10/19/2015   PLT 159.0 10/19/2015   GLUCOSE 152 (H) 10/19/2015   CHOL 188 10/19/2015   TRIG 303.0 (H) 10/19/2015   HDL 39.80 10/19/2015   LDLDIRECT 114.0 10/19/2015   LDLCALC 148 (H) 07/24/2013   ALT 20 10/19/2015   AST 15 10/19/2015   NA 139 10/19/2015   K 4.3 10/19/2015   CL 103 10/19/2015   CREATININE 0.77 10/19/2015   BUN 13 10/19/2015   CO2 26 10/19/2015   TSH 1.53 12/23/2014   HGBA1C 7.3 (H) 10/19/2015   MICROALBUR <0.7 12/23/2014    Dg Chest 2 View  Result Date: 02/28/2016 CLINICAL DATA:  Worsening cough. EXAM: CHEST  2 VIEW COMPARISON:  10/21/2015. FINDINGS: Mediastinum and hilar structures are normal. Left base subsegmental atelectasis. No pleural effusion or pneumothorax. Degenerative changes thoracic spine. Postsurgical changes right shoulder. IMPRESSION: Mild left base subsegmental atelectasis. Electronically Signed   By: Marcello Moores  Register   On: 02/28/2016 07:11       Assessment & Plan:   Problem List Items Addressed This Visit    Acid reflux     Controlled on current regimen.       Anxiety and depression    Doing better.  Feels better.  Follow.        Asthma    Breathing overall stable.  Mild wheezing and cough currently.  Do not feel needs oral prednisone or abx at this time.  Continue advair and spiriva.  Follow closely.  Will let me know if progression.        CD (Crohn's disease) ([place])    No  abdominal pain.  Bowels stable.       Diabetes mellitus ([place])    Low carb diet and exercise.  Seeing Dr Gabriel Carina now.  Sugars better.  Endocrine following a1c and met b.  She plans to f/u within the next few weeks with eye exam.        Essential (primary) hypertension    Blood pressure under good control.  Continue same medication regimen.  Follow pressures.  Follow metabolic panel.        Hypercholesterolemia without hypertriglyceridemia    Low cholesterol diet and exercise.  Follow lipid panel.       Obesity (BMI 30-39.9)    Low carb diet and exercise.  Follow.       Vitamin D deficiency    Taking vitamin D supplements.  Recheck vitamin D level with next labs.        Relevant Orders   VITAMIN D 25 Hydroxy (Vit-D Deficiency, Fractures)    Other Visit Diagnoses    Right wrist pain    -  Primary   Exam as outlined.  splint as directed.  tylenol.  call with update.         Einar Pheasant, MD

## 2016-08-17 NOTE — Progress Notes (Signed)
Pre-visit discussion using our clinic review tool. No additional management support is needed unless otherwise documented below in the visit note.  

## 2016-08-18 ENCOUNTER — Encounter: Payer: Self-pay | Admitting: Internal Medicine

## 2016-08-18 NOTE — Assessment & Plan Note (Signed)
Blood pressure under good control.  Continue same medication regimen.  Follow pressures.  Follow metabolic panel.   

## 2016-08-18 NOTE — Assessment & Plan Note (Signed)
Doing better.  Feels better.  Follow.

## 2016-08-18 NOTE — Assessment & Plan Note (Signed)
Taking vitamin D supplements.  Recheck vitamin D level with next labs.

## 2016-08-18 NOTE — Assessment & Plan Note (Signed)
Controlled on current regimen.   

## 2016-08-18 NOTE — Assessment & Plan Note (Signed)
Low carb diet and exercise.  Follow.

## 2016-08-18 NOTE — Assessment & Plan Note (Signed)
Low cholesterol diet and exercise.  Follow lipid panel.

## 2016-08-18 NOTE — Assessment & Plan Note (Addendum)
Low carb diet and exercise.  Seeing Dr Gabriel Carina now.  Sugars better.  Endocrine following a1c and met b.  She plans to f/u within the next few weeks with eye exam.

## 2016-08-18 NOTE — Assessment & Plan Note (Signed)
No abdominal pain.  Bowels stable.

## 2016-08-18 NOTE — Assessment & Plan Note (Signed)
Breathing overall stable.  Mild wheezing and cough currently.  Do not feel needs oral prednisone or abx at this time.  Continue advair and spiriva.  Follow closely.  Will let me know if progression.

## 2016-08-20 ENCOUNTER — Other Ambulatory Visit: Payer: Self-pay | Admitting: Internal Medicine

## 2016-08-20 MED ORDER — ALBUTEROL SULFATE HFA 108 (90 BASE) MCG/ACT IN AERS: 2.0000 | INHALATION_SPRAY | Freq: Four times a day (QID) | RESPIRATORY_TRACT | 3 refills | Status: DC | PRN

## 2016-08-20 MED ORDER — FLUTICASONE-SALMETEROL 230-21 MCG/ACT IN AERO: 2.0000 | INHALATION_SPRAY | Freq: Two times a day (BID) | RESPIRATORY_TRACT | 3 refills | Status: DC

## 2016-08-20 MED ORDER — TIOTROPIUM BROMIDE MONOHYDRATE 2.5 MCG/ACT IN AERS: 2.0000 | INHALATION_SPRAY | Freq: Every day | RESPIRATORY_TRACT | 3 refills | Status: DC

## 2016-08-20 NOTE — Telephone Encounter (Signed)
RX printed and placed in MD folder for signature.

## 2016-08-20 NOTE — Telephone Encounter (Signed)
Pt needs a refill on Advair HFA, Ventolin, and Spiriva meds refilled. She usually gets 90 days with 3 refills and they usually dispense 90 of each. Pt needs rx written so she can pick them up and take them [facility]. Please advise

## 2016-08-21 ENCOUNTER — Other Ambulatory Visit (INDEPENDENT_AMBULATORY_CARE_PROVIDER_SITE_OTHER): Payer: PRIVATE HEALTH INSURANCE

## 2016-08-21 ENCOUNTER — Telehealth: Payer: Self-pay | Admitting: Radiology

## 2016-08-21 ENCOUNTER — Telehealth: Payer: Self-pay

## 2016-08-21 DIAGNOSIS — E559 Vitamin D deficiency, unspecified: Secondary | ICD-10-CM

## 2016-08-21 DIAGNOSIS — E119 Type 2 diabetes mellitus without complications: Secondary | ICD-10-CM | POA: Diagnosis not present

## 2016-08-21 DIAGNOSIS — E7800 Pure hypercholesterolemia, unspecified: Secondary | ICD-10-CM

## 2016-08-21 DIAGNOSIS — I10 Essential (primary) hypertension: Secondary | ICD-10-CM | POA: Diagnosis not present

## 2016-08-21 LAB — POCT GLYCOSYLATED HEMOGLOBIN (HGB A1C): Hemoglobin A1C: 7.1

## 2016-08-21 LAB — TSH: TSH: 1.01 u[IU]/mL (ref 0.35–4.50)

## 2016-08-21 LAB — MICROALBUMIN / CREATININE URINE RATIO
Creatinine,U: 21.7 mg/dL
Microalb Creat Ratio: 3.2 mg/g (ref 0.0–30.0)
Microalb, Ur: <0.7 mg/dL (ref 0.0–1.9)

## 2016-08-21 NOTE — Telephone Encounter (Signed)
Pt came in for labs today and was a difficult stick. Two attempts were done on pt. Was able to run A1C on pt but only about 1/4 of green tube was filled. Unable to run CBC and only about two to three tests will be able to be ran off green tube. PT states she would like her lipid panel done but wanted to see what other tests you might need before sending blood off? Thank you.

## 2016-08-21 NOTE — Telephone Encounter (Signed)
Note given to you for order of preference of labs.  Thanks

## 2016-08-21 NOTE — Addendum Note (Signed)
Addended by: Arby Barrette on: 08/21/2016 01:17 PM   Modules accepted: Orders

## 2016-08-22 LAB — HEPATIC FUNCTION PANEL
ALBUMIN: 4.1 g/dL (ref 3.5–5.2)
ALT: 25 U/L (ref 0–35)
AST: 31 U/L (ref 0–37)
Alkaline Phosphatase: 52 U/L (ref 39–117)
Bilirubin, Direct: 0.4 mg/dL — ABNORMAL HIGH (ref 0.0–0.3)
TOTAL PROTEIN: 7.4 g/dL (ref 6.0–8.3)
Total Bilirubin: 0.3 mg/dL (ref 0.2–1.2)

## 2016-08-22 LAB — LIPID PANEL
CHOL/HDL RATIO: 4
CHOLESTEROL: 211 mg/dL — AB (ref 0–200)
HDL: 48.2 mg/dL (ref >39.00)
NonHDL: 162.47
TRIGLYCERIDES: 243 mg/dL — AB (ref 0.0–149.0)
VLDL: 48.6 mg/dL — ABNORMAL HIGH (ref 0.0–40.0)

## 2016-08-22 LAB — LDL CHOLESTEROL, DIRECT: LDL DIRECT: 129 mg/dL

## 2016-08-22 MED ORDER — RABEPRAZOLE SODIUM 20 MG PO TBEC: 20.0000 mg | DELAYED_RELEASE_TABLET | Freq: Two times a day (BID) | ORAL | 3 refills | Status: DC

## 2016-08-22 MED ORDER — AMLODIPINE BESYLATE 5 MG PO TABS: 5.0000 mg | ORAL_TABLET | Freq: Every day | ORAL | 3 refills | Status: DC

## 2016-08-22 MED ORDER — METFORMIN HCL 500 MG PO TABS: ORAL_TABLET | ORAL | 3 refills | Status: DC

## 2016-08-22 MED ORDER — METOPROLOL SUCCINATE ER 50 MG PO TB24: 50.0000 mg | ORAL_TABLET | Freq: Every day | ORAL | 3 refills | Status: DC

## 2016-08-22 MED ORDER — BUPROPION HCL ER (XL) 300 MG PO TB24: 300.0000 mg | ORAL_TABLET | Freq: Every day | ORAL | 3 refills | Status: DC

## 2016-08-22 MED ORDER — FUROSEMIDE 20 MG PO TABS: 20.0000 mg | ORAL_TABLET | Freq: Every day | ORAL | 3 refills | Status: DC

## 2016-08-22 MED ORDER — LOSARTAN POTASSIUM 100 MG PO TABS: 100.0000 mg | ORAL_TABLET | Freq: Every day | ORAL | 3 refills | Status: DC

## 2016-08-22 MED ORDER — MONTELUKAST SODIUM 10 MG PO TABS: 10.0000 mg | ORAL_TABLET | Freq: Every day | ORAL | 3 refills | Status: DC

## 2016-08-22 MED ORDER — FLUTICASONE PROPIONATE 50 MCG/ACT NA SUSP: 2.0000 | Freq: Every day | NASAL | 3 refills | Status: DC

## 2016-08-22 NOTE — Telephone Encounter (Signed)
Patient is going to [place] this weekend ans would like to have written scripts for the following for 90 day supply with 3 refills.  Welbutrin 368m 1 po qd  Acifex 464m 1 po bid  Cozaar 10082m po qd  Metformin 500m55mbid  Norvasc 5 mg  1 po qd  Lasix 20mg84mo qd Toporal 50mg 51m qd  Singular 10mg  48m  Flonase 1 bid

## 2016-08-22 NOTE — Telephone Encounter (Signed)
Patient informed scrpts are at reception for pick up.

## 2016-08-22 NOTE — Telephone Encounter (Signed)
rx's printed to take to [facility].  Placed in your box.

## 2016-08-23 ENCOUNTER — Telehealth: Payer: Self-pay | Admitting: Internal Medicine

## 2016-08-23 NOTE — Telephone Encounter (Signed)
Patient needs paper rx for inhalers to take to [facility] on Saturday morning.  Please call when ready for ick up  Spireva  Advair  Albuterol

## 2016-08-23 NOTE — Telephone Encounter (Signed)
Patient aware rx ready for pick-up.

## 2016-08-23 NOTE — Telephone Encounter (Signed)
RXs placed in DKs folder to be signed.

## 2016-08-26 ENCOUNTER — Other Ambulatory Visit: Payer: Self-pay | Admitting: Internal Medicine

## 2016-08-26 DIAGNOSIS — R17 Unspecified jaundice: Secondary | ICD-10-CM

## 2016-08-26 NOTE — Progress Notes (Signed)
Order placed for f/u liver panel.

## 2016-09-04 ENCOUNTER — Telehealth: Payer: Self-pay | Admitting: Internal Medicine

## 2016-09-04 NOTE — Telephone Encounter (Signed)
Pt calling stating she is having some Wheezing going along with tightness Going on about a week She's been using all her normal medication but can't seem to fight it she states  May need something called in  Please call back

## 2016-09-04 NOTE — Telephone Encounter (Signed)
Please advise on message below. DK pt last seen 06/2016.

## 2016-09-04 NOTE — Telephone Encounter (Signed)
Per DS, bring pt in tomorrow at 1130am to be seen. Called pt and she will be here. Appt made. Nothing further needed.

## 2016-09-05 ENCOUNTER — Ambulatory Visit (INDEPENDENT_AMBULATORY_CARE_PROVIDER_SITE_OTHER): Payer: PRIVATE HEALTH INSURANCE | Admitting: Pulmonary Disease

## 2016-09-05 ENCOUNTER — Encounter: Payer: Self-pay | Admitting: Pulmonary Disease

## 2016-09-05 VITALS — BP 132/78 | HR 97 | Ht 66.0 in | Wt 223.0 lb

## 2016-09-05 DIAGNOSIS — R938 Abnormal findings on diagnostic imaging of other specified body structures: Secondary | ICD-10-CM | POA: Diagnosis not present

## 2016-09-05 DIAGNOSIS — R9389 Abnormal findings on diagnostic imaging of other specified body structures: Secondary | ICD-10-CM

## 2016-09-05 DIAGNOSIS — J441 Chronic obstructive pulmonary disease with (acute) exacerbation: Secondary | ICD-10-CM

## 2016-09-05 MED ORDER — DOXYCYCLINE HYCLATE 100 MG PO TABS: 100.0000 mg | ORAL_TABLET | Freq: Two times a day (BID) | ORAL | 0 refills | Status: DC

## 2016-09-05 MED ORDER — PREDNISONE 20 MG PO TABS: ORAL_TABLET | ORAL | 0 refills | Status: DC

## 2016-09-05 MED ORDER — PREDNISONE 20 MG PO TABS: 20.0000 mg | ORAL_TABLET | Freq: Every day | ORAL | 0 refills | Status: DC

## 2016-09-05 MED ORDER — FLUCONAZOLE 100 MG PO TABS: 100.0000 mg | ORAL_TABLET | Freq: Every day | ORAL | 0 refills | Status: AC

## 2016-09-05 NOTE — Patient Instructions (Addendum)
This is an exacerbation of COPD (asthmatic bronchitis):  Continue Advair and Spiriva as controller medications Continue Albuterol inhaler as first line rescue therapy Continue Duoneb as second line rescue medication  Doxycycline 100 mg twice a day for 7 days  Prednisone 20 mg tabs 3 per day for three days, then 2 per day for three days, then 1 per day for three days, then Stop  Fluconazole 100 mg daily for 7 days

## 2016-09-05 NOTE — Progress Notes (Signed)
ACUTE PULMONARY OFFICE VISIT  CHRONIC PULMONARY PROBLEMS: COPD - initially diagnosed 2000 Former smoker - quit 1999   ACUTE PROBLEM:   SUBJ: This a patient Dr. Mortimer Fries. She was last seen 06/26/16. She is seen as an acute office visit with one week of cough, chest tightness, wheezing and mucus which initially started out as white and has now progressed to yellow. She denies fever and chest pain. She has no lower extremity edema or calf tenderness. She has had no hemoptysis. She rarely uses her rescue medications at baseline but has been using her rescue MDI and nebulized medications 3-4 times per day over the last couple of days.  OBJ: Vitals:   09/05/16 1052  Weight: 223 lb (101.2 kg)  Height: 5' 6"  (1.676 m)    Gen: WDWN in NAD HEENT: NCAT, sclerae white, oropharynx normal Neck: NO LAN, no JVD noted Lungs: Breath sounds are full, slightly coarse with few scattered wheezes Cardiovascular: Reg rate, normal rhythm, no M noted Abdomen: Soft, NT, +BS Ext: no C/C/E Neuro: PERRL, EOMI, motor/sensory grossly intact Skin: No lesions noted  Previous chest x-ray from February of this year and prior PFTs have been reviewed.  Her chest x-ray revealed bibasilar opacities and blunting of the left costophrenic angle. No follow-up chest x-ray has been obtained  IMPRESSION: COPD with acute exacerbation ([place]) - Plan: DG Chest 2 View  Abnormal CXR - Plan: DG Chest 2 View    PLAN:   Continue Advair and Spiriva as controller medications Continue Albuterol inhaler as first line rescue therapy Continue Duoneb as second line rescue medication  Doxycycline 100 mg twice a day for 7 days  Prednisone 20 mg tabs 3 per day for three days, then 2 per day for three days, then 1 per day for three days, then Stop  Fluconazole 100 mg daily for 7 days (she has a history of recurrent vaginal yeast infections when treated with antibiotics and prednisone)   follow-up with Dr. Mortimer Fries in 4 weeks with chest  x-ray prior to that visit  Merton Border, MD PCCM service Mobile [phone] Pager [phone] 09/05/2016 3:24 PM\

## 2016-09-10 ENCOUNTER — Other Ambulatory Visit: Payer: PRIVATE HEALTH INSURANCE

## 2016-10-08 ENCOUNTER — Telehealth: Payer: Self-pay | Admitting: Pulmonary Disease

## 2016-10-08 ENCOUNTER — Telehealth: Payer: Self-pay | Admitting: Internal Medicine

## 2016-10-08 MED ORDER — PREDNISONE 10 MG PO TABS: ORAL_TABLET | ORAL | 0 refills | Status: DC

## 2016-10-08 NOTE — Telephone Encounter (Signed)
Please advise on previous message.

## 2016-10-08 NOTE — Telephone Encounter (Signed)
Patient advised Prednisone 10 mg has been sent to pharmacy.

## 2016-10-08 NOTE — Telephone Encounter (Signed)
Called by Hiram Gash d/t pharmacy not getting prescription that Dr. Ashby Dawes sent earlier in the day. Will phone in prescription as dictated by Dr. Ashby Dawes. Prednisone 10 mg, Disp: #42, Sig: take 6 tablets on day 1 and 2, take 5 tablets on day 3 and 4, take 4 tablets on days 5 and 6, take 3 tablets on day 7 and 8, take 2 tablets on day 9 and 10 and take 1 tablet on day 11 and 12. Then stop.

## 2016-10-08 NOTE — Telephone Encounter (Signed)
She should be using a nebulizer with duonebs at least 3 times daily. If she is already doing this, can send in a script for prednisone taper.   Prednisone 10 mg tabs x  42.  Take 6 tablets on day 1,2 Take 5 tablets on day 3,4 Take 4 tablets on day 5,6 Take 3 tablets on day 7,8 Take 2 tablets on day 9,10 Take 1 tablet on day 11,12 Then stop. Prednisone 10 mg tabs x  42.  Take 6 tablets on day 1,2 Take 5 tablets on day 3,4 Take 4 tablets on day 5,6 Take 3 tablets on day 7,8 Take 2 tablets on day 9,10 Take 1 tablet on day 11,12 Then stop.

## 2016-10-08 NOTE — Telephone Encounter (Signed)
Pt calling stating she's having some wheezing along with her COPD acting up and tightness in her chest Started last Wednesday  Would like to know what can be called in to help her   Please advise.

## 2016-10-13 ENCOUNTER — Ambulatory Visit
Admission: RE | Admit: 2016-10-13 | Discharge: 2016-10-13 | Disposition: A | Discharge status: Home / Self Care | Payer: PRIVATE HEALTH INSURANCE | Source: Ambulatory Visit | Attending: Pulmonary Disease | Admitting: Pulmonary Disease

## 2016-10-13 DIAGNOSIS — J441 Chronic obstructive pulmonary disease with (acute) exacerbation: Secondary | ICD-10-CM

## 2016-10-13 DIAGNOSIS — R9389 Abnormal findings on diagnostic imaging of other specified body structures: Secondary | ICD-10-CM

## 2016-10-13 DIAGNOSIS — R931 Abnormal findings on diagnostic imaging of heart and coronary circulation: Secondary | ICD-10-CM | POA: Diagnosis present

## 2016-10-13 DIAGNOSIS — I700 Atherosclerosis of aorta: Secondary | ICD-10-CM | POA: Diagnosis not present

## 2016-10-13 DIAGNOSIS — J42 Unspecified chronic bronchitis: Secondary | ICD-10-CM | POA: Diagnosis not present

## 2016-10-15 ENCOUNTER — Encounter: Payer: Self-pay | Admitting: Pulmonary Disease

## 2016-10-15 ENCOUNTER — Ambulatory Visit (INDEPENDENT_AMBULATORY_CARE_PROVIDER_SITE_OTHER): Payer: PRIVATE HEALTH INSURANCE | Admitting: Pulmonary Disease

## 2016-10-15 VITALS — Ht 66.0 in | Wt 216.0 lb

## 2016-10-15 DIAGNOSIS — J441 Chronic obstructive pulmonary disease with (acute) exacerbation: Secondary | ICD-10-CM

## 2016-10-15 DIAGNOSIS — J449 Chronic obstructive pulmonary disease, unspecified: Secondary | ICD-10-CM | POA: Diagnosis not present

## 2016-10-15 MED ORDER — IPRATROPIUM-ALBUTEROL 0.5-2.5 (3) MG/3ML IN SOLN: RESPIRATORY_TRACT | 1 refills | Status: DC

## 2016-10-15 NOTE — Patient Instructions (Signed)
Complete prednisone - 2 tablets daily for the next 2 days Continue Advair, Spiriva Continue albuterol inhaler as first line rescue therapy Continue DuoNeb the second line rescue therapy  Follow-up in 3-4 months with Dr. Mortimer Fries

## 2016-10-18 NOTE — Progress Notes (Signed)
ACUTE PULMONARY OFFICE VISIT  CHRONIC PULMONARY PROBLEMS: COPD - initially diagnosed 2000 Former smoker - quit 1999   ACUTE PROBLEM: Recent COPD exacerbation  SUBJ: Patient of Dr. Mortimer Fries. She was last seen by me 09/05/16 and treated as a COPD exacerbation. She initially improved, then her symptoms relapsed and she has recently been treated again with a prednisone course - presently completing a taper. She is again approaching her baseline and has not required albuterol MDI or neb on this day. Up until 07/04, she using the Duoneb 2-3 times per day. Denies CP, fever, purulent sputum, hemoptysis, LE edema and calf tenderness.  OBJ: Vitals:   10/15/16 1612  Weight: 98 kg (216 lb)  Height: 5' 6"  (1.676 m)     Gen: NAD HEENT: WNL Neck: NO LAN, no JVD Lungs: Breath sounds are full, slightly coarse with no wheezes Cardiovascular: Reg, no M noted Abdomen: Soft, NT, +BS Ext: no C/C/E Neuro: grossly intact  CXR 07/07: [facility]  IMPRESSION: COPD, mild (H[facility])  COPD exacerbation, resolving    PLAN:   Complete prednisone - 2 tablets daily for the next 2 days Continue Advair, Spiriva Continue albuterol inhaler as first line rescue therapy Continue DuoNeb the second line rescue therapy  Follow-up in 3-4 months with Dr. Clydell Hakim, MD PCCM service Mobile [phone] Pager [phone] 10/18/2016 4:54 AM\

## 2016-12-11 ENCOUNTER — Other Ambulatory Visit: Payer: Self-pay

## 2016-12-11 ENCOUNTER — Telehealth: Payer: Self-pay | Admitting: *Deleted

## 2016-12-11 MED ORDER — METOPROLOL SUCCINATE ER 50 MG PO TB24: 50.0000 mg | ORAL_TABLET | Freq: Every day | ORAL | 0 refills | Status: DC

## 2016-12-11 NOTE — Telephone Encounter (Signed)
Patient picks up scripts from [facility] not able to go last week. Needed 30 day sent to local. Have sent that in. Patient informed.

## 2016-12-11 NOTE — Telephone Encounter (Signed)
Pt is requesting a medication refill for metoprolol  Pharmacy [facility]

## 2016-12-20 ENCOUNTER — Ambulatory Visit (INDEPENDENT_AMBULATORY_CARE_PROVIDER_SITE_OTHER): Payer: PRIVATE HEALTH INSURANCE | Admitting: Internal Medicine

## 2016-12-20 ENCOUNTER — Other Ambulatory Visit (HOSPITAL_COMMUNITY)
Admission: RE | Admit: 2016-12-20 | Discharge: 2016-12-20 | Disposition: A | Discharge status: Home / Self Care | Payer: PRIVATE HEALTH INSURANCE | Source: Ambulatory Visit | Attending: Internal Medicine | Admitting: Internal Medicine

## 2016-12-20 ENCOUNTER — Encounter: Payer: Self-pay | Admitting: Internal Medicine

## 2016-12-20 VITALS — BP 128/70 | HR 83 | Temp 97.9°F | Ht 66.14 in | Wt 223.0 lb

## 2016-12-20 DIAGNOSIS — F329 Major depressive disorder, single episode, unspecified: Secondary | ICD-10-CM

## 2016-12-20 DIAGNOSIS — J441 Chronic obstructive pulmonary disease with (acute) exacerbation: Secondary | ICD-10-CM

## 2016-12-20 DIAGNOSIS — K219 Gastro-esophageal reflux disease without esophagitis: Secondary | ICD-10-CM

## 2016-12-20 DIAGNOSIS — E7800 Pure hypercholesterolemia, unspecified: Secondary | ICD-10-CM | POA: Diagnosis not present

## 2016-12-20 DIAGNOSIS — Z0000 Encounter for general adult medical examination without abnormal findings: Secondary | ICD-10-CM | POA: Diagnosis not present

## 2016-12-20 DIAGNOSIS — E669 Obesity, unspecified: Secondary | ICD-10-CM | POA: Diagnosis not present

## 2016-12-20 DIAGNOSIS — E119 Type 2 diabetes mellitus without complications: Secondary | ICD-10-CM

## 2016-12-20 DIAGNOSIS — N898 Other specified noninflammatory disorders of vagina: Secondary | ICD-10-CM

## 2016-12-20 DIAGNOSIS — I10 Essential (primary) hypertension: Secondary | ICD-10-CM | POA: Diagnosis not present

## 2016-12-20 DIAGNOSIS — Z1231 Encounter for screening mammogram for malignant neoplasm of breast: Secondary | ICD-10-CM

## 2016-12-20 DIAGNOSIS — E559 Vitamin D deficiency, unspecified: Secondary | ICD-10-CM | POA: Diagnosis not present

## 2016-12-20 DIAGNOSIS — J4520 Mild intermittent asthma, uncomplicated: Secondary | ICD-10-CM

## 2016-12-20 DIAGNOSIS — Z124 Encounter for screening for malignant neoplasm of cervix: Secondary | ICD-10-CM | POA: Diagnosis not present

## 2016-12-20 DIAGNOSIS — F32A Depression, unspecified: Secondary | ICD-10-CM

## 2016-12-20 DIAGNOSIS — Z1239 Encounter for other screening for malignant neoplasm of breast: Secondary | ICD-10-CM

## 2016-12-20 DIAGNOSIS — F419 Anxiety disorder, unspecified: Secondary | ICD-10-CM

## 2016-12-20 NOTE — Assessment & Plan Note (Addendum)
Physical today 12/20/16.  PAP 10/01/13 negative with negative HPV.  Repeat pap 12/20/16.   Colonoscopy 06/2007.  Schedule mammogram.

## 2016-12-20 NOTE — Progress Notes (Signed)
[name], female   DOB: 1955/08/22, 61 y.o.   MRN: [id number]   Subjective:    [name], female    DOB: 07-31-1955, 61 y.o.   MRN: [id number]  HPI  Patient here for her physical exam.  Has known diabetes.  Sees Dr Gabriel Carina.  Last a1c 7.1 - 08/2016.  States am sugars averaging 130-150.  Not checking in the pm.  Discussed diet and exercise.  Also has COPD.  Sees Dr Mortimer Fries. Recently saw Dr Alva Garnet.  Breathing is stable.  Doing well on spiriva.  No chest pain.  No acid reflux.  No abdominal pain.  Bowels moving.  Increased stress.  She has now recently lost her second brother.  Still coping with her mother's death as well.  Discussed with her today.  She feels she is handling things relatively well and does not feel she needs any further intervention.     Past Medical History:  Diagnosis Date  . COPD (chronic obstructive pulmonary disease) ([place])   . Crohn's disease ([place])   . Depression   . Diabetes mellitus ([place])   . Hypercholesterolemia   . Hypertension   . IBS (irritable bowel syndrome)   . Nephrolithiasis    s/p lithotripsy [facility])   Past Surgical History:  Procedure Laterality Date  . ABDOMINAL HYSTERECTOMY  1985  . APPENDECTOMY  1985  . BREAST BIOPSY  2013  . LAPAROSCOPIC CHOLECYSTECTOMY  2005   Dr Tamala Julian  . LAPAROSCOPIC SUPRACERVICAL HYSTERECTOMY  1985   left ovary not removed  . LITHOTRIPSY     x7  . POLYPECTOMY    . SHOULDER SURGERY     right (pinning)  . SHOULDER SURGERY     left (pinning)  . TONSILLECTOMY  1966   Family History  Problem Relation Age of Onset  . Heart disease Father   . Emphysema Maternal Grandmother   . COPD Mother   . Hypercholesterolemia Mother   . Emphysema Mother   . Hypertension Brother   . Emphysema Unknown        grandmother  . Breast cancer Neg Hx   . Colon cancer Neg Hx    Social History   Social History  . Marital status: Married    Spouse name: N/A  . Number of children: 2  .  Years of education: N/A   Occupational History  .  [employer]   Social History Main Topics  . Smoking status: Former Smoker    Packs/day: 2.00    Years: 15.00    Quit date: 04/09/1994  . Smokeless tobacco: Never Used     Comment: QUIT IN 1990'S  . Alcohol use 0.0 oz/week     Comment: OCCASIONALLY  . Drug use: No  . Sexual activity: Not Asked   Other Topics Concern  . None   Social History Narrative  . None    Outpatient Encounter Prescriptions as of 12/20/2016  Medication Sig  . Albiglutide (TANZEUM) 30 MG PEN Inject 50 mg into the skin. Take every Tuesday  . albuterol (PROVENTIL HFA;VENTOLIN HFA) 108 (90 Base) MCG/ACT inhaler Inhale 2 puffs into the lungs every 6 (six) hours as needed.  Marland Kitchen amLODipine (NORVASC) 5 MG tablet Take 1 tablet (5 mg total) by mouth daily.  Marland Kitchen buPROPion (WELLBUTRIN XL) 300 MG 24 hr tablet Take 1 tablet (300 mg total) by mouth daily.  . empagliflozin (JARDIANCE) 25 MG TABS tablet Take 25 mg by mouth daily.  . fluticasone (FLONASE)  50 MCG/ACT nasal spray Place 2 sprays into both nostrils daily.  . fluticasone-salmeterol (ADVAIR HFA) 230-21 MCG/ACT inhaler Inhale 2 puffs into the lungs 2 (two) times daily.  . furosemide (LASIX) 20 MG tablet Take 1 tablet (20 mg total) by mouth daily.  . hyoscyamine (LEVSIN SL) 0.125 MG SL tablet Place 0.125 mg under the tongue every 6 (six) hours as needed.  Marland Kitchen ipratropium-albuterol (DUONEB) 0.5-2.5 (3) MG/3ML SOLN TAKE 3 MLS BY NEBULIZATION EVERY 8 (EIGHT) HOURS AS NEEDED.  Marland Kitchen losartan (COZAAR) 100 MG tablet Take 1 tablet (100 mg total) by mouth daily.  . metFORMIN (GLUCOPHAGE) 500 MG tablet 2 tablets bid  . metoprolol succinate (TOPROL XL) 50 MG 24 hr tablet Take 1 tablet (50 mg total) by mouth daily. Take with or immediately following a meal.  . montelukast (SINGULAIR) 10 MG tablet Take 1 tablet (10 mg total) by mouth daily.  . RABEprazole (ACIPHEX) 20 MG tablet Take 1 tablet (20 mg total) by mouth 2 (two) times daily  before a meal.  . Vitamin D, Ergocalciferol, (DRISDOL) 50000 UNITS CAPS capsule TAKE 1 CAPSULE (50,000 UNITS TOTAL) BY MOUTH EVERY 7 (SEVEN) DAYS.  . [DISCONTINUED] Tiotropium Bromide Monohydrate (SPIRIVA RESPIMAT) 2.5 MCG/ACT AERS Inhale 2 puffs into the lungs daily.   No facility-administered encounter medications on file as of 12/20/2016.     Review of Systems  Constitutional: Negative for appetite change and unexpected weight change.  HENT: Negative for congestion and sinus pressure.   Eyes: Negative for pain and visual disturbance.  Respiratory: Negative for cough, chest tightness and shortness of breath.   Cardiovascular: Negative for chest pain, palpitations and leg swelling.  Gastrointestinal: Negative for abdominal pain, diarrhea, nausea and vomiting.  Genitourinary: Negative for difficulty urinating and dysuria.  Musculoskeletal: Negative for back pain and joint swelling.  Skin: Negative for color change and rash.  Neurological: Negative for dizziness, light-headedness and headaches.  Hematological: Negative for adenopathy. Does not bruise/bleed easily.  Psychiatric/Behavioral: Negative for agitation and dysphoric mood.       Objective:    Physical Exam  Constitutional: She is oriented to person, place, and time. She appears well-developed and well-nourished. No distress.  HENT:  Nose: Nose normal.  Mouth/Throat: Oropharynx is clear and moist.  Eyes: Right eye exhibits no discharge. Left eye exhibits no discharge. No scleral icterus.  Neck: Neck supple. No thyromegaly present.  Cardiovascular: Normal rate and regular rhythm.   Pulmonary/Chest: Breath sounds normal. No accessory muscle usage. No tachypnea. No respiratory distress. She has no decreased breath sounds. She has no wheezes. She has no rhonchi. Right breast exhibits no inverted nipple, no mass, no nipple discharge and no tenderness (no axillary adenopathy). Left breast exhibits no inverted nipple, no mass, no  nipple discharge and no tenderness (no axilarry adenopathy).  Abdominal: Soft. Bowel sounds are normal. There is no tenderness.  Genitourinary:  Genitourinary Comments: Normal external genitalia.  Vaginal vault without lesions.  Cervix identified.  Pap smear performed.  Could not appreciate any adnexal masses or tenderness.    Musculoskeletal: She exhibits no edema or tenderness.  Lymphadenopathy:    She has no cervical adenopathy.  Neurological: She is alert and oriented to person, place, and time.  Skin: Skin is warm. No rash noted. No erythema.  Psychiatric: She has a normal mood and affect. Her behavior is normal.    BP 128/70 (BP Location: Left Arm, Patient Position: Sitting, Cuff Size: Large)   Pulse 83   Temp 97.9 F (36.6 C) (  Oral)   Ht 5' 6.14" (1.68 m)   Wt 223 lb (101.2 kg)   SpO2 91%   BMI 35.84 kg/m  Wt Readings from Last 3 Encounters:  12/20/16 223 lb (101.2 kg)  10/15/16 216 lb (98 kg)  09/05/16 223 lb (101.2 kg)     Lab Results  Component Value Date   WBC 7.3 10/19/2015   HGB 16.1 Repeated and verified X2. (H) 10/19/2015   HCT 47.7 (H) 10/19/2015   PLT 159.0 10/19/2015   GLUCOSE 152 (H) 10/19/2015   CHOL 211 (H) 08/21/2016   TRIG 243.0 (H) 08/21/2016   HDL 48.20 08/21/2016   LDLDIRECT 129.0 08/21/2016   LDLCALC 148 (H) 07/24/2013   ALT 25 08/21/2016   AST 31 08/21/2016   NA 139 10/19/2015   K 4.3 10/19/2015   CL 103 10/19/2015   CREATININE 0.77 10/19/2015   BUN 13 10/19/2015   CO2 26 10/19/2015   TSH 1.01 08/21/2016   HGBA1C 7.1 08/21/2016   MICROALBUR <0.7 08/21/2016    Dg Chest 2 View  Result Date: 10/13/2016 CLINICAL DATA:  COPD exacerbation EXAM: CHEST  2 VIEW COMPARISON:  May 15, 2016 and February 27, 2016 FINDINGS: There is mild central peribronchial thickening, chronic. No edema or consolidation. Heart size and pulmonary vascularity are normal. No adenopathy. There is aortic atherosclerosis. There is postoperative change in the proximal  right humerus. IMPRESSION: Chronic central bronchitis. No edema or consolidation. Stable cardiac silhouette. There is aortic atherosclerosis. Aortic Atherosclerosis (ICD10-I70.0). Electronically Signed   By: Lowella Grip III M.D.   On: 10/13/2016 14:01       Assessment & Plan:   Problem List Items Addressed This Visit    Anxiety and depression    Doing better.  Feels better.  Doing well on current dose of wellbutrin.        Asthma    Breathing stable.        COPD exacerbation (HCC)    Breathing stable.        Diabetes mellitus (HCC)    Seeing Dr Gabriel Carina.  Sugars better.  Continue low carb diet and exercise.  Follow.        Relevant Orders   Hemoglobin V7C   Basic metabolic panel   GERD (gastroesophageal reflux disease)    Controlled on current regimen.  Follow.        Health care maintenance    Physical today 12/20/16.  PAP 10/01/13 negative with negative HPV.  Repeat pap 12/20/16.   Colonoscopy 06/2007.  Schedule mammogram.       Hypercholesterolemia without hypertriglyceridemia    Low cholesterol diet and exercise.  Follow lipid panel.        Relevant Orders   Hepatic function panel   Lipid panel   Hypertension    Blood pressure under good control.  Continue same medication regimen.  Follow pressures.  Follow metabolic panel.        Relevant Orders   CBC with Differential/Platelet   Obesity (BMI 30-39.9)    Discussed diet and exercise.  Follow.       Vitamin D deficiency    Follow vitamin D level.       Relevant Orders   VITAMIN D 25 Hydroxy (Vit-D Deficiency, Fractures)    Other Visit Diagnoses    Routine general medical examination at a health care facility    -  Primary   Screening for breast cancer       Relevant Orders   MM DIGITAL SCREENING  BILATERAL   Screening for cervical cancer       Relevant Orders   Cytology - PAP (Completed)   Vaginal discharge       Relevant Orders   Cervicovaginal ancillary only (Completed)       Einar Pheasant, MD

## 2016-12-21 LAB — CYTOLOGY - PAP
Diagnosis: NEGATIVE
HPV: NOT DETECTED

## 2016-12-21 LAB — CERVICOVAGINAL ANCILLARY ONLY: Wet Prep (BD Affirm): NEGATIVE

## 2016-12-21 LAB — HM DIABETES EYE EXAM

## 2016-12-22 ENCOUNTER — Encounter: Payer: Self-pay | Admitting: Internal Medicine

## 2016-12-22 NOTE — Assessment & Plan Note (Signed)
Doing better.  Feels better.  Doing well on current dose of wellbutrin.

## 2016-12-22 NOTE — Assessment & Plan Note (Signed)
Discussed diet and exercise.  Follow.  

## 2016-12-22 NOTE — Assessment & Plan Note (Signed)
Breathing stable.

## 2016-12-22 NOTE — Assessment & Plan Note (Signed)
Low cholesterol diet and exercise.  Follow lipid panel.

## 2016-12-22 NOTE — Assessment & Plan Note (Signed)
Controlled on current regimen.  Follow.  

## 2016-12-22 NOTE — Assessment & Plan Note (Signed)
Blood pressure under good control.  Continue same medication regimen.  Follow pressures.  Follow metabolic panel.   

## 2016-12-22 NOTE — Assessment & Plan Note (Signed)
Follow vitamin D level.

## 2016-12-22 NOTE — Assessment & Plan Note (Signed)
Seeing Dr Gabriel Carina.  Sugars better.  Continue low carb diet and exercise.  Follow.

## 2016-12-25 ENCOUNTER — Other Ambulatory Visit: Payer: Self-pay | Admitting: Internal Medicine

## 2016-12-25 DIAGNOSIS — N898 Other specified noninflammatory disorders of vagina: Secondary | ICD-10-CM

## 2016-12-25 NOTE — Progress Notes (Signed)
Order placed for gyn referral.  

## 2017-01-10 ENCOUNTER — Other Ambulatory Visit: Payer: PRIVATE HEALTH INSURANCE

## 2017-01-11 ENCOUNTER — Encounter: Payer: Self-pay | Admitting: Advanced Practice Midwife

## 2017-01-11 ENCOUNTER — Ambulatory Visit (INDEPENDENT_AMBULATORY_CARE_PROVIDER_SITE_OTHER): Payer: PRIVATE HEALTH INSURANCE | Admitting: Advanced Practice Midwife

## 2017-01-11 VITALS — BP 128/84 | Ht 65.0 in | Wt 218.0 lb

## 2017-01-11 DIAGNOSIS — N941 Unspecified dyspareunia: Secondary | ICD-10-CM | POA: Diagnosis not present

## 2017-01-11 DIAGNOSIS — N952 Postmenopausal atrophic vaginitis: Secondary | ICD-10-CM

## 2017-01-11 MED ORDER — PRASTERONE 6.5 MG VA INST: 6.5000 mg | VAGINAL_INSERT | Freq: Every day | VAGINAL | 11 refills | Status: DC

## 2017-01-11 NOTE — Progress Notes (Signed)
S: The patient is here today with complaint of vaginal/vulvar irritation, occasional itching, lacerations that hurt with urination, history of pain with intercourse. She had previously been using premarin up until about 5 years ago when she had a fatty lump removed from her right breast. The lump was benign but she was advised that hormone replacement can contribute to the incidence of breast masses. She has no family history of breast or ovarian cancer. She began to have pain with intercourse following discontinuation of the hormone treatment. She has not been sexually active since then. She had a recent visit with her primary care doctor with PAP and vaginitis negative. She admits to using mild and unscented body care products. Her main concern is the pain that she experiences from the vaginal irritation. She also has a history of COPD and insulin controlled DM. She admits to eating healthy. She does not have much exercise due to the COPD and her last HgbA1C was 8. Discussion of treatment options given her history. She would like to try [place] which is a hormone precursor treatment used to treat atrophic vaginitis.  O: Vital Signs: BP 128/84   Ht 5' 5"  (1.651 m)   Wt 218 lb (98.9 kg)   BMI 36.28 kg/m  Constitutional: Well nourished, well developed female in no acute distress.  HEENT: normal Skin: Warm and dry.  Cardiovascular: Regular rate and rhythm.   Respiratory: Clear to auscultation bilateral. Normal respiratory effort Psych: Alert and Oriented x3. No memory deficits. Normal mood and affect.  MS: normal gait, normal bilateral lower extremity ROM/strength/stability.  Pelvic exam:  is not limited by body habitus for purpose of this exam EGBUS: within normal limits except irritation noted, no active lacerations seen Vagina: only introitus examined and found to be normal for age Cervix: not evaluated  A: 61 yo female with atrophic vaginitis  P: Sea salt soak to help heal irritated  vulva Coconut oil applied to area to help decrease irritation IntraRosa Rx to treat atrophic vaginitis  Rod Can, CNM

## 2017-01-11 NOTE — Patient Instructions (Signed)
Atrophic Vaginitis Atrophic vaginitis is a condition in which the tissues that line the vagina become dry and thin. This condition is most common in women who have stopped having regular menstrual periods (menopause). This usually starts when a woman is 80-62 years old. Estrogen helps to keep the vagina moist. It stimulates the vagina to produce a clear fluid that lubricates the vagina for sexual intercourse. This fluid also protects the vagina from infection. Lack of estrogen can cause the lining of the vagina to get thinner and dryer. The vagina may also shrink in size. It may become less elastic. Atrophic vaginitis tends to get worse over time as a woman's estrogen level drops. What are the causes? This condition is caused by the normal drop in estrogen that happens around the time of menopause. What increases the risk? Certain conditions or situations may lower a woman's estrogen level, which increases her risk of atrophic vaginitis. These include:  Taking medicine that blocks estrogen.  Having ovaries removed surgically.  Being treated for cancer with X-ray treatment (radiation) or medicines (chemotherapy).  Exercising very hard and often.  Having an eating disorder (anorexia).  Giving birth or breastfeeding.  Being over the age of 30.  Smoking.  What are the signs or symptoms? Symptoms of this condition include:  Pain, soreness, or bleeding during sexual intercourse (dyspareunia).  Vaginal burning, irritation, or itching.  Pain or bleeding during a vaginal examination using a speculum (pelvic exam).  Loss of interest in sexual activity.  Having burning pain when passing urine.  Vaginal discharge that is brown or yellow.  In some cases, there are no symptoms. How is this diagnosed? This condition is diagnosed with a medical history and physical exam. This will include a pelvic exam that checks whether the inside of your vagina appears pale, thin, or dry. Rarely, you may  also have other tests, including:  A urine test.  A test that checks the acid balance in your vaginal fluid (acid balance test).  How is this treated? Treatment for this condition may depend on the severity of your symptoms. Treatment may include:  Using an over-the-counter vaginal lubricant before you have sexual intercourse.  Using a long-acting vaginal moisturizer.  Using low-dose vaginal estrogen for moderate to severe symptoms that do not respond to other treatments. Options include creams, tablets, and inserts (vaginal rings). Before using vaginal estrogen, tell your health care provider if you have a history of: ? Breast cancer. ? Endometrial cancer. ? Blood clots.  Taking medicines. You may be able to take a daily pill for dyspareunia. Discuss all of the risks of this medicine with your health care provider. It is usually not recommended for women who have a family history or personal history of breast cancer.  If your symptoms are very mild and you are not sexually active, you may not need treatment. Follow these instructions at home:  Take medicines only as directed by your health care provider. Do not use herbal or alternative medicines unless your health care provider says that you can.  Use over-the-counter creams, lubricants, or moisturizers for dryness only as directed by your health care provider.  If your atrophic vaginitis is caused by menopause, discuss all of your menopausal symptoms and treatment options with your health care provider.  Do not douche.  Do not use products that can make your vagina dry. These include: ? Scented feminine sprays. ? Scented tampons. ? Scented soaps.  If it hurts to have sex, talk with your sexual  partner. Contact a health care provider if:  Your discharge looks different than normal.  Your vagina has an unusual smell.  You have new symptoms.  Your symptoms do not improve with treatment.  Your symptoms get worse. This  information is not intended to replace advice given to you by your health care provider. Make sure you discuss any questions you have with your health care provider. Document Released: 08/10/2014 Document Revised: 09/01/2015 Document Reviewed: 03/17/2014 Elsevier Interactive Patient Education  [employer].

## 2017-02-01 ENCOUNTER — Other Ambulatory Visit (INDEPENDENT_AMBULATORY_CARE_PROVIDER_SITE_OTHER): Payer: PRIVATE HEALTH INSURANCE

## 2017-02-01 DIAGNOSIS — E7800 Pure hypercholesterolemia, unspecified: Secondary | ICD-10-CM | POA: Diagnosis not present

## 2017-02-01 DIAGNOSIS — I10 Essential (primary) hypertension: Secondary | ICD-10-CM

## 2017-02-01 DIAGNOSIS — E559 Vitamin D deficiency, unspecified: Secondary | ICD-10-CM | POA: Diagnosis not present

## 2017-02-01 DIAGNOSIS — E119 Type 2 diabetes mellitus without complications: Secondary | ICD-10-CM | POA: Diagnosis not present

## 2017-02-01 LAB — LIPID PANEL
CHOL/HDL RATIO: 4
Cholesterol: 190 mg/dL (ref 0–200)
HDL: 44 mg/dL (ref >39.00)
NonHDL: 146.36
Triglycerides: 228 mg/dL — ABNORMAL HIGH (ref 0.0–149.0)
VLDL: 45.6 mg/dL — AB (ref 0.0–40.0)

## 2017-02-01 LAB — BASIC METABOLIC PANEL
BUN: 15 mg/dL (ref 6–23)
CHLORIDE: 103 meq/L (ref 96–112)
CO2: 27 mEq/L (ref 19–32)
Calcium: 8.9 mg/dL (ref 8.4–10.5)
Creatinine, Ser: 0.76 mg/dL (ref 0.40–1.20)
GFR: 81.96 mL/min (ref >60.00)
GLUCOSE: 127 mg/dL — AB (ref 70–99)
POTASSIUM: 4 meq/L (ref 3.5–5.1)
SODIUM: 139 meq/L (ref 135–145)

## 2017-02-01 LAB — HEPATIC FUNCTION PANEL
ALK PHOS: 54 U/L (ref 39–117)
ALT: 24 U/L (ref 0–35)
AST: 17 U/L (ref 0–37)
Albumin: 3.8 g/dL (ref 3.5–5.2)
BILIRUBIN DIRECT: 0.1 mg/dL (ref 0.0–0.3)
BILIRUBIN TOTAL: 0.4 mg/dL (ref 0.2–1.2)
Total Protein: 7 g/dL (ref 6.0–8.3)

## 2017-02-01 LAB — CBC WITH DIFFERENTIAL/PLATELET
BASOS PCT: 1.2 % (ref 0.0–3.0)
Basophils Absolute: 0.1 10*3/uL (ref 0.0–0.1)
EOS ABS: 0.4 10*3/uL (ref 0.0–0.7)
Eosinophils Relative: 4.7 % (ref 0.0–5.0)
HEMATOCRIT: 48.1 % — AB (ref 36.0–46.0)
HEMOGLOBIN: 15.8 g/dL — AB (ref 12.0–15.0)
LYMPHS PCT: 22.9 % (ref 12.0–46.0)
Lymphs Abs: 2 10*3/uL (ref 0.7–4.0)
MCHC: 32.9 g/dL (ref 30.0–36.0)
MCV: 89.4 fl (ref 78.0–100.0)
MONO ABS: 1 10*3/uL (ref 0.1–1.0)
Monocytes Relative: 11.5 % (ref 3.0–12.0)
Neutro Abs: 5.1 10*3/uL (ref 1.4–7.7)
Neutrophils Relative %: 59.7 % (ref 43.0–77.0)
PLATELETS: 181 10*3/uL (ref 150.0–400.0)
RBC: 5.38 Mil/uL — ABNORMAL HIGH (ref 3.87–5.11)
RDW: 14.9 % (ref 11.5–15.5)
WBC: 8.5 10*3/uL (ref 4.0–10.5)

## 2017-02-01 LAB — LDL CHOLESTEROL, DIRECT: Direct LDL: 120 mg/dL

## 2017-02-01 LAB — HEMOGLOBIN A1C: Hgb A1c MFr Bld: 6.9 % — ABNORMAL HIGH (ref 4.6–6.5)

## 2017-02-01 LAB — VITAMIN D 25 HYDROXY (VIT D DEFICIENCY, FRACTURES): VITD: 23.74 ng/mL — AB (ref 30.00–100.00)

## 2017-02-19 LAB — HM MAMMOGRAPHY

## 2017-03-04 ENCOUNTER — Encounter: Payer: Self-pay | Admitting: Internal Medicine

## 2017-03-04 ENCOUNTER — Ambulatory Visit (INDEPENDENT_AMBULATORY_CARE_PROVIDER_SITE_OTHER): Payer: PRIVATE HEALTH INSURANCE | Admitting: Internal Medicine

## 2017-03-04 VITALS — BP 128/78 | HR 82 | Ht 65.0 in | Wt 220.0 lb

## 2017-03-04 DIAGNOSIS — J449 Chronic obstructive pulmonary disease, unspecified: Secondary | ICD-10-CM | POA: Diagnosis not present

## 2017-03-04 MED ORDER — TIOTROPIUM BROMIDE MONOHYDRATE 2.5 MCG/ACT IN AERS: 2.0000 | INHALATION_SPRAY | Freq: Every day | RESPIRATORY_TRACT | 5 refills | Status: DC

## 2017-03-04 NOTE — Progress Notes (Signed)
Amanda Wiley Pulmonary Medicine Consultation      MRN# [id number] Amanda Wiley [birth year]   CC: Chief Complaint  Patient presents with  . Follow-up    COPD:       Brief History: 61 yo F History of COPD, Crohn's disease, seeing [facility] for COPD optimization. Now on Advair, Spiriva, allergy control.   CC follow up COPD HPI Presents today for a follow up visit.  No acute issues SOB at baseline Former smoker Last COPD exacerbation was 05/2016 Good improvement overall with Spiriva.   No signs of infection at this time  Current Outpatient Medications:  .  Albiglutide (TANZEUM) 30 MG PEN, Inject 50 mg into the skin. Take every Tuesday, Disp: , Rfl:  .  albuterol (PROVENTIL HFA;VENTOLIN HFA) 108 (90 Base) MCG/ACT inhaler, Inhale 2 puffs into the lungs every 6 (six) hours as needed., Disp: 54 g, Rfl: 3 .  amLODipine (NORVASC) 5 MG tablet, Take 1 tablet (5 mg total) by mouth daily., Disp: 90 tablet, Rfl: 3 .  buPROPion (WELLBUTRIN XL) 300 MG 24 hr tablet, Take 1 tablet (300 mg total) by mouth daily., Disp: 90 tablet, Rfl: 3 .  empagliflozin (JARDIANCE) 25 MG TABS tablet, Take 25 mg by mouth daily., Disp: , Rfl:  .  fluticasone (FLONASE) 50 MCG/ACT nasal spray, Place 2 sprays into both nostrils daily., Disp: 48 g, Rfl: 3 .  fluticasone-salmeterol (ADVAIR HFA) 230-21 MCG/ACT inhaler, Inhale 2 puffs into the lungs 2 (two) times daily., Disp: 3 Inhaler, Rfl: 3 .  furosemide (LASIX) 20 MG tablet, Take 1 tablet (20 mg total) by mouth daily., Disp: 90 tablet, Rfl: 3 .  hyoscyamine (LEVSIN SL) 0.125 MG SL tablet, Place 0.125 mg under the tongue every 6 (six) hours as needed., Disp: , Rfl:  .  ipratropium-albuterol (DUONEB) 0.5-2.5 (3) MG/3ML SOLN, TAKE 3 MLS BY NEBULIZATION EVERY 8 (EIGHT) HOURS AS NEEDED., Disp: 360 mL, Rfl: 1 .  losartan (COZAAR) 100 MG tablet, Take 1 tablet (100 mg total) by mouth daily., Disp: 90 tablet, Rfl: 3 .  metFORMIN (GLUCOPHAGE) 500 MG tablet, 2  tablets bid, Disp: 360 tablet, Rfl: 3 .  metoprolol succinate (TOPROL XL) 50 MG 24 hr tablet, Take 1 tablet (50 mg total) by mouth daily. Take with or immediately following a meal., Disp: 30 tablet, Rfl: 0 .  montelukast (SINGULAIR) 10 MG tablet, Take 1 tablet (10 mg total) by mouth daily., Disp: 90 tablet, Rfl: 3 .  Prasterone (INTRAROSA) 6.5 MG INST, Place 6.5 mg vaginally at bedtime., Disp: 30 each, Rfl: 11 .  RABEprazole (ACIPHEX) 20 MG tablet, Take 1 tablet (20 mg total) by mouth 2 (two) times daily before a meal., Disp: 180 tablet, Rfl: 3 .  Vitamin D, Ergocalciferol, (DRISDOL) 50000 UNITS CAPS capsule, TAKE 1 CAPSULE (50,000 UNITS TOTAL) BY MOUTH EVERY 7 (SEVEN) DAYS., Disp: 8 capsule, Rfl: 2   Review of Systems  Constitutional: Negative for chills and fever.  HENT: Negative for congestion and sore throat.   Respiratory: Negative for cough, hemoptysis, sputum production, shortness of breath and wheezing.   Gastrointestinal: Negative for heartburn, nausea and vomiting.  Genitourinary: Negative for dysuria.  Musculoskeletal: Negative for myalgias.  Psychiatric/Behavioral: Negative for substance abuse (.vs).      Allergies:  Avelox [moxifloxacin hcl in nacl]; Moxifloxacin; Allegra [fexofenadine hcl]; Sulfa antibiotics; Tegretol [carbamazepine]; Iodine; Ioxaglate; and Ivp dye [iodinated diagnostic agents]  Physical Examination:  VS: BP 128/78 (BP Location: Left Arm, Cuff Size: Normal)   Pulse 82  Ht 5' 5"  (1.651 m)   Wt 220 lb (99.8 kg)   SpO2 93%   BMI 36.61 kg/m    BP 128/78 (BP Location: Left Arm, Cuff Size: Normal)   Pulse 82   Ht 5' 5"  (1.651 m)   Wt 220 lb (99.8 kg)   SpO2 93%   BMI 36.61 kg/m    General Appearance: No distress  HEENT: PERRLA, no ptosis, no other lesions noticed Pulmonary:mild upper lobe expiratory wheezes, no crackles, good airway entry Cardiovascular:  Normal S1,S2.  No m/r/g.     Abdomen:Exam: Benign, Soft, non-tender, No masses  Skin:    warm, no rashes, no ecchymosis  Extremities: normal, no cyanosis, clubbing, warm with normal capillary refill.    Pulmonary function testing 12/22/2015 FEV1 to 69% FEV1/FVC 73% RV 72 TLC 76 ERV 27 DLCO corrected 114  Flow loops within next expiratory scooping Impression: Mild obstruction, clinical correlation advised. Significant reduction in ERV, could be related to abdominal obesity, clinical correlation advised.   6 minute walk test: Total distance 1102 feet, 336 m, low saturation 94%, highest heart rate 98.   Assessment and Plan: 61 yo with Mild/Moderate COPD gold stage A  1.no need for systemic steroids at this time 2.continue advair and spiriva respimat 2.5 3.albuterol as needed   Patient refuses to wear her oxygen because can not stand it and can not tolerate it SHe refuses to wear it at this time, will place order to remove.   Patient  satisfied with Plan of action and management. All questions answered  Corrin Parker, M.D.  [facility]  Medical Director [place] Director [facility] Cardio-Pulmonary Department

## 2017-03-04 NOTE — Patient Instructions (Signed)
Continue inhalers as prescribed

## 2017-04-09 HISTORY — PX: EXCISION / BIOPSY BREAST / NIPPLE / DUCT: SUR469

## 2017-04-09 HISTORY — PX: EXCISION OF BREAST BIOPSY: SHX5822

## 2017-04-25 ENCOUNTER — Other Ambulatory Visit: Payer: Self-pay | Admitting: *Deleted

## 2017-04-25 MED ORDER — TIOTROPIUM BROMIDE MONOHYDRATE 2.5 MCG/ACT IN AERS: 2.0000 | INHALATION_SPRAY | Freq: Every day | RESPIRATORY_TRACT | 2 refills | Status: DC

## 2017-04-30 ENCOUNTER — Ambulatory Visit: Payer: PRIVATE HEALTH INSURANCE | Admitting: Internal Medicine

## 2017-05-16 ENCOUNTER — Ambulatory Visit: Payer: PRIVATE HEALTH INSURANCE | Admitting: Internal Medicine

## 2017-09-17 ENCOUNTER — Ambulatory Visit (INDEPENDENT_AMBULATORY_CARE_PROVIDER_SITE_OTHER): Admitting: Internal Medicine

## 2017-09-17 ENCOUNTER — Encounter: Payer: Self-pay | Admitting: Internal Medicine

## 2017-09-17 VITALS — Resp 16 | Ht 65.0 in | Wt 221.0 lb

## 2017-09-17 DIAGNOSIS — J440 Chronic obstructive pulmonary disease with acute lower respiratory infection: Secondary | ICD-10-CM | POA: Diagnosis not present

## 2017-09-17 DIAGNOSIS — J209 Acute bronchitis, unspecified: Secondary | ICD-10-CM

## 2017-09-17 DIAGNOSIS — J441 Chronic obstructive pulmonary disease with (acute) exacerbation: Secondary | ICD-10-CM

## 2017-09-17 DIAGNOSIS — R0609 Other forms of dyspnea: Secondary | ICD-10-CM | POA: Diagnosis not present

## 2017-09-17 DIAGNOSIS — J449 Chronic obstructive pulmonary disease, unspecified: Secondary | ICD-10-CM | POA: Diagnosis not present

## 2017-09-17 MED ORDER — FLUCONAZOLE 100 MG PO TABS
100.0000 mg | ORAL_TABLET | Freq: Every day | ORAL | 0 refills | Status: AC
Start: 2017-09-17 — End: 2017-10-01

## 2017-09-17 MED ORDER — AZITHROMYCIN 250 MG PO TABS: 250.0000 mg | ORAL_TABLET | Freq: Every day | ORAL | 0 refills | Status: DC

## 2017-09-17 MED ORDER — PREDNISONE 10 MG (21) PO TBPK: ORAL_TABLET | ORAL | 0 refills | Status: DC

## 2017-09-17 NOTE — Patient Instructions (Addendum)
Take singulair at night.  Will give course of prednisone and azithromycin.   Recommend that you keep pets out of the bedroom.

## 2017-09-17 NOTE — Progress Notes (Signed)
[facility] Consultation      MRN# [id number] Amanda Wiley 1955/09/11   CC: Chief Complaint  Patient presents with  . Acute Visit    DK patient last seen 02/2017 COPD. Pt on Advair and Spiriva and Albuterol as needed.. Pt refused 02 at last visit with Dr. Mortimer Fries.  . Cough      Brief History: 62 yo F History of COPD, Crohn's disease, seeing [facility] for COPD optimization. Now on Advair, Spiriva, allergy control.   CC follow up COPD HPI Patient normally sees Dr. Mortimer Fries, she is here as an acute visit. Last COPD exacerbation was 05/2016. She had been doing well but for the past week she has been coughing and has been having wheezing. She is using her albuterol every 4 hours. She is also using her advair and spiriva and prescribed.  She denies reflux, she is on aciphex.  She has 4 dogs, they sleep in her bed.  She has allergies but not sure to what, she takes singulair daily.  She requests diflucan if given prednisone because she usually develops thrush.     Current Outpatient Medications:  .  Albiglutide (TANZEUM) 30 MG PEN, Inject 50 mg into the skin. Take every Tuesday, Disp: , Rfl:  .  albuterol (PROVENTIL HFA;VENTOLIN HFA) 108 (90 Base) MCG/ACT inhaler, Inhale 2 puffs into the lungs every 6 (six) hours as needed., Disp: 54 g, Rfl: 3 .  amLODipine (NORVASC) 5 MG tablet, Take 1 tablet (5 mg total) by mouth daily., Disp: 90 tablet, Rfl: 3 .  buPROPion (WELLBUTRIN XL) 300 MG 24 hr tablet, Take 1 tablet (300 mg total) by mouth daily., Disp: 90 tablet, Rfl: 3 .  empagliflozin (JARDIANCE) 25 MG TABS tablet, Take 25 mg by mouth daily., Disp: , Rfl:  .  fluticasone (FLONASE) 50 MCG/ACT nasal spray, Place 2 sprays into both nostrils daily., Disp: 48 g, Rfl: 3 .  fluticasone-salmeterol (ADVAIR HFA) 230-21 MCG/ACT inhaler, Inhale 2 puffs into the lungs 2 (two) times daily., Disp: 3 Inhaler, Rfl: 3 .  furosemide (LASIX) 20 MG tablet, Take 1 tablet (20 mg total) by  mouth daily., Disp: 90 tablet, Rfl: 3 .  hyoscyamine (LEVSIN SL) 0.125 MG SL tablet, Place 0.125 mg under the tongue every 6 (six) hours as needed., Disp: , Rfl:  .  ipratropium-albuterol (DUONEB) 0.5-2.5 (3) MG/3ML SOLN, TAKE 3 MLS BY NEBULIZATION EVERY 8 (EIGHT) HOURS AS NEEDED., Disp: 360 mL, Rfl: 1 .  losartan (COZAAR) 100 MG tablet, Take 1 tablet (100 mg total) by mouth daily., Disp: 90 tablet, Rfl: 3 .  metFORMIN (GLUCOPHAGE) 500 MG tablet, 2 tablets bid, Disp: 360 tablet, Rfl: 3 .  metoprolol succinate (TOPROL XL) 50 MG 24 hr tablet, Take 1 tablet (50 mg total) by mouth daily. Take with or immediately following a meal., Disp: 30 tablet, Rfl: 0 .  montelukast (SINGULAIR) 10 MG tablet, Take 1 tablet (10 mg total) by mouth daily., Disp: 90 tablet, Rfl: 3 .  Prasterone (INTRAROSA) 6.5 MG INST, Place 6.5 mg vaginally at bedtime., Disp: 30 each, Rfl: 11 .  RABEprazole (ACIPHEX) 20 MG tablet, Take 1 tablet (20 mg total) by mouth 2 (two) times daily before a meal., Disp: 180 tablet, Rfl: 3 .  Tiotropium Bromide Monohydrate (SPIRIVA RESPIMAT) 2.5 MCG/ACT AERS, Inhale 2 puffs into the lungs daily., Disp: 3 Inhaler, Rfl: 2 .  Vitamin D, Ergocalciferol, (DRISDOL) 50000 UNITS CAPS capsule, TAKE 1 CAPSULE (50,000 UNITS TOTAL) BY MOUTH EVERY 7 (SEVEN)  DAYS., Disp: 8 capsule, Rfl: 2   Review of Systems  Constitutional: Negative for chills and fever.  HENT: Negative for congestion and sore throat.   Respiratory: Positive for cough, sputum production, shortness of breath and wheezing. Negative for hemoptysis.   Gastrointestinal: Negative for heartburn, nausea and vomiting.  Genitourinary: Negative for dysuria.  Musculoskeletal: Negative for myalgias.  Psychiatric/Behavioral: Negative for substance abuse (.vs).      Allergies:  Avelox [moxifloxacin hcl in nacl]; Moxifloxacin; Allegra [fexofenadine hcl]; Sulfa antibiotics; Tegretol [carbamazepine]; Iodine; Ioxaglate; and Ivp dye [iodinated diagnostic  agents]  Physical Examination:  VS: Resp 16   Ht 5' 5"  (1.651 m)   Wt 221 lb (100.2 kg)   BMI 36.78 kg/m       General Appearance: No distress  HEENT: PERRLA, no ptosis, no other lesions noticed Pulmonary: Scattered bilateral wheezing. Cardiovascular:  Normal S1,S2.  No m/r/g.     Abdomen:Exam: Benign, Soft, non-tender, No masses  Skin:   warm, no rashes, no ecchymosis  Extremities: normal, no cyanosis, clubbing, warm with normal capillary refill.    Pulmonary function testing 12/22/2015 FEV1 to 69% FEV1/FVC 73% RV 72 TLC 76 ERV 27 DLCO corrected 114  Flow loops within next expiratory scooping Impression: Mild obstruction, clinical correlation advised. Significant reduction in ERV, could be related to abdominal obesity, clinical correlation advised.   6 minute walk test: Total distance 1102 feet, 336 m, low saturation 94%, highest heart rate 98.   Assessment and Plan: 62 yo with Mild/Moderate COPD gold stage C with acute exacerbation/acute bronchitis.  1.continue Advair and Spiriva. 2.we will give course of azithromycin and prednisone taper. 3.albuterol as needed Recommended to take Spiriva at night.  Prescribe Diflucan as patient frequently gets thrush with steroids.

## 2017-09-18 ENCOUNTER — Telehealth: Payer: Self-pay | Admitting: Internal Medicine

## 2017-09-18 NOTE — Telephone Encounter (Signed)
Pt needs written copies on the following rx for 90 days with refills   -buPROPion (WELLBUTRIN XL) 300 MG 24 hr tablet -furosemide (LASIX) 20 MG tablet -losartan (COZAAR) 100 MG tablet -metoprolol succinate (TOPROL XL) 50 MG 24 hr tablet -RABEprazole (ACIPHEX) 20 MG tablet -montelukast (SINGULAIR) 10 MG tablet -amLODipine (NORVASC) 5 MG tablet -- pt stated 2.5

## 2017-09-18 NOTE — Telephone Encounter (Signed)
Pt was seen in 12/2016. OK to print scripts for #90 with 1 refill?

## 2017-09-18 NOTE — Telephone Encounter (Signed)
Needs an appt scheduled.  Have not seen since 12/2016.  When appt scheduled ok to refill for 90 days only.  Also needs labs.

## 2017-09-23 ENCOUNTER — Other Ambulatory Visit: Payer: Self-pay

## 2017-09-23 MED ORDER — FUROSEMIDE 20 MG PO TABS: 20.0000 mg | ORAL_TABLET | Freq: Every day | ORAL | 3 refills | Status: DC

## 2017-09-23 MED ORDER — AMLODIPINE BESYLATE 5 MG PO TABS: 5.0000 mg | ORAL_TABLET | Freq: Every day | ORAL | 0 refills | Status: DC

## 2017-09-23 MED ORDER — RABEPRAZOLE SODIUM 20 MG PO TBEC: 20.0000 mg | DELAYED_RELEASE_TABLET | Freq: Two times a day (BID) | ORAL | 0 refills | Status: DC

## 2017-09-23 MED ORDER — LOSARTAN POTASSIUM 100 MG PO TABS: 100.0000 mg | ORAL_TABLET | Freq: Every day | ORAL | 0 refills | Status: DC

## 2017-09-23 MED ORDER — BUPROPION HCL ER (XL) 300 MG PO TB24: 300.0000 mg | ORAL_TABLET | Freq: Every day | ORAL | 0 refills | Status: DC

## 2017-09-23 MED ORDER — METOPROLOL SUCCINATE ER 50 MG PO TB24: 50.0000 mg | ORAL_TABLET | Freq: Every day | ORAL | 0 refills | Status: DC

## 2017-09-23 MED ORDER — MONTELUKAST SODIUM 10 MG PO TABS: 10.0000 mg | ORAL_TABLET | Freq: Every day | ORAL | 0 refills | Status: DC

## 2017-09-23 NOTE — Telephone Encounter (Signed)
Okay 

## 2017-09-23 NOTE — Telephone Encounter (Signed)
rx placed up front for pick up

## 2017-09-23 NOTE — Telephone Encounter (Signed)
Pt aware.

## 2017-09-23 NOTE — Telephone Encounter (Signed)
Patient has been scheduled for a follow up.  Joycelyn Schmid, if I print these do you mind signing so patient can pick up tomorrow?

## 2017-09-24 ENCOUNTER — Other Ambulatory Visit: Payer: Self-pay

## 2017-09-26 ENCOUNTER — Other Ambulatory Visit: Payer: Self-pay

## 2017-09-26 ENCOUNTER — Telehealth: Payer: Self-pay | Admitting: Internal Medicine

## 2017-09-26 NOTE — Telephone Encounter (Unsigned)
Copied from [place] [id number]. Topic: Quick Communication - See Telephone Encounter >> Sep 26, 2017 11:36 AM Percell Belt A wrote: CRM for notification. See Telephone encounter for: 09/26/17. Pt called in and stated that she pick up her script so she can get them [place].  The metoprolol succinate (TOPROL XL) 50 MG 24 hr tablet [[id number]]   Was sent to [facility] in error.  When husband pick up written script today, this one was missing.  She would like to come back and pick this up.    Best number -[phone]

## 2017-09-27 ENCOUNTER — Other Ambulatory Visit: Payer: Self-pay | Admitting: *Deleted

## 2017-09-27 ENCOUNTER — Telehealth: Payer: Self-pay | Admitting: *Deleted

## 2017-09-27 MED ORDER — METOPROLOL SUCCINATE ER 50 MG PO TB24: 50.0000 mg | ORAL_TABLET | Freq: Every day | ORAL | 1 refills | Status: DC

## 2017-09-27 MED ORDER — METOPROLOL SUCCINATE ER 50 MG PO TB24: 50.0000 mg | ORAL_TABLET | Freq: Every day | ORAL | 0 refills | Status: DC

## 2017-09-27 NOTE — Telephone Encounter (Signed)
Copied from [place] [id number]. Topic: Quick Communication - See Telephone Encounter >> Sep 26, 2017 11:36 AM Percell Belt A wrote: CRM for notification. See Telephone encounter for: 09/26/17. Pt called in and stated that she pick up her script so she can get them filled in [place].  The metoprolol succinate (TOPROL XL) 50 MG 24 hr tablet [[id number]]   Was sent to [facility] in error.  When husband pick up written script today, this one was missing.  She would like to come back and pick this up.    Best number -[phone]  >> Sep 27, 2017 10:49 AM Boyd Kerbs wrote: Pt. Called again about prescription Toprol and is needing this written script as she takes to [facility] and this was sent to [place].  She has to pay there.  Wanting to get written one

## 2017-09-27 NOTE — Telephone Encounter (Signed)
Notified patient script ready and placed at front desk. 

## 2017-10-17 ENCOUNTER — Encounter: Payer: Self-pay | Admitting: Internal Medicine

## 2017-10-17 ENCOUNTER — Ambulatory Visit (INDEPENDENT_AMBULATORY_CARE_PROVIDER_SITE_OTHER): Admitting: Internal Medicine

## 2017-10-17 DIAGNOSIS — E559 Vitamin D deficiency, unspecified: Secondary | ICD-10-CM

## 2017-10-17 DIAGNOSIS — I10 Essential (primary) hypertension: Secondary | ICD-10-CM

## 2017-10-17 DIAGNOSIS — K50919 Crohn's disease, unspecified, with unspecified complications: Secondary | ICD-10-CM | POA: Diagnosis not present

## 2017-10-17 DIAGNOSIS — K219 Gastro-esophageal reflux disease without esophagitis: Secondary | ICD-10-CM

## 2017-10-17 DIAGNOSIS — F419 Anxiety disorder, unspecified: Secondary | ICD-10-CM | POA: Diagnosis not present

## 2017-10-17 DIAGNOSIS — F329 Major depressive disorder, single episode, unspecified: Secondary | ICD-10-CM

## 2017-10-17 DIAGNOSIS — E669 Obesity, unspecified: Secondary | ICD-10-CM | POA: Diagnosis not present

## 2017-10-17 DIAGNOSIS — J4520 Mild intermittent asthma, uncomplicated: Secondary | ICD-10-CM | POA: Diagnosis not present

## 2017-10-17 DIAGNOSIS — E7800 Pure hypercholesterolemia, unspecified: Secondary | ICD-10-CM | POA: Diagnosis not present

## 2017-10-17 DIAGNOSIS — E119 Type 2 diabetes mellitus without complications: Secondary | ICD-10-CM | POA: Diagnosis not present

## 2017-10-17 LAB — HM DIABETES FOOT EXAM

## 2017-10-17 MED ORDER — FLUTICASONE PROPIONATE 50 MCG/ACT NA SUSP: 2.0000 | Freq: Every day | NASAL | 3 refills | Status: DC

## 2017-10-17 MED ORDER — LOSARTAN POTASSIUM 100 MG PO TABS: 100.0000 mg | ORAL_TABLET | Freq: Every day | ORAL | 3 refills | Status: DC

## 2017-10-17 MED ORDER — MONTELUKAST SODIUM 10 MG PO TABS: 10.0000 mg | ORAL_TABLET | Freq: Every day | ORAL | 3 refills | Status: DC

## 2017-10-17 MED ORDER — RABEPRAZOLE SODIUM 20 MG PO TBEC: 20.0000 mg | DELAYED_RELEASE_TABLET | Freq: Two times a day (BID) | ORAL | 3 refills | Status: DC

## 2017-10-17 MED ORDER — FUROSEMIDE 20 MG PO TABS: 20.0000 mg | ORAL_TABLET | Freq: Every day | ORAL | 3 refills | Status: DC

## 2017-10-17 MED ORDER — METOPROLOL SUCCINATE ER 50 MG PO TB24: 50.0000 mg | ORAL_TABLET | Freq: Every day | ORAL | 3 refills | Status: DC

## 2017-10-17 MED ORDER — BUPROPION HCL ER (XL) 300 MG PO TB24: 300.0000 mg | ORAL_TABLET | Freq: Every day | ORAL | 3 refills | Status: DC

## 2017-10-17 MED ORDER — AMLODIPINE BESYLATE 5 MG PO TABS: 5.0000 mg | ORAL_TABLET | Freq: Every day | ORAL | 3 refills | Status: DC

## 2017-10-17 NOTE — Progress Notes (Signed)
[name], female   DOB: Mar 12, 1956, 62 y.o.   MRN: [id number]   Subjective:    [name], female    DOB: [birth year], 62 y.o.   MRN: [id number]  HPI  Patient here for a scheduled follow up.  She is seeing endocrinology 08/01/17 for f/u diabetes.  Felt stable.  Recommended f/u in 4 months.  Recent left foot fracture.  Saw podiatry.  Has been released.  Breathing is stable.  No increased cough or congestion.  No chest pain.  No acid reflux.  No abdominal pain.  Bowels moving.  Sugars have been doing well.  Did not bring in any sugar readings.  Lost her job.  Home now.  Handling stress well.     Past Medical History:  Diagnosis Date  . COPD (chronic obstructive pulmonary disease) ([place])   . Crohn's disease ([place])   . Depression   . Diabetes mellitus ([place])   . Hypercholesterolemia   . Hypertension   . IBS (irritable bowel syndrome)   . Nephrolithiasis    s/p lithotripsy [facility])   Past Surgical History:  Procedure Laterality Date  . ABDOMINAL HYSTERECTOMY  1985  . APPENDECTOMY  1985  . BREAST BIOPSY  2013  . LAPAROSCOPIC CHOLECYSTECTOMY  2005   Dr Tamala Julian  . LAPAROSCOPIC SUPRACERVICAL HYSTERECTOMY  1985   left ovary not removed  . LITHOTRIPSY     x7  . POLYPECTOMY    . SHOULDER SURGERY     right (pinning)  . SHOULDER SURGERY     left (pinning)  . TONSILLECTOMY  1966   Family History  Problem Relation Age of Onset  . Heart disease Father   . Emphysema Maternal Grandmother   . COPD Mother   . Hypercholesterolemia Mother   . Emphysema Mother   . Hypertension Brother   . Emphysema Unknown        grandmother  . Breast cancer Neg Hx   . Colon cancer Neg Hx    Social History   Socioeconomic History  . Marital status: Married    Spouse name: Not on file  . Number of children: 2  . Years of education: Not on file  . Highest education level: Not on file  Occupational History    Employer: [employer]  .  Financial resource strain: Not on file  . Food insecurity:    Worry: Not on file    Inability: Not on file  . Transportation needs:    Medical: Not on file    Non-medical: Not on file  Tobacco Use  . Smoking status: Former Smoker    Packs/day: 2.00    Years: 15.00    Pack years: 30.00    Last attempt to quit: 04/09/1994    Years since quitting: 23.5  . Smokeless tobacco: Never Used  . Tobacco comment: QUIT IN 1990'S  Substance and Sexual Activity  . Alcohol use: Yes    Alcohol/week: 0.0 oz    Comment: OCCASIONALLY  . Drug use: No  . Sexual activity: Not on file  Lifestyle  . Physical activity:    Days per week: Not on file    Minutes per session: Not on file  . Stress: Not on file  Relationships  . Social connections:    Talks on phone: Not on file    Gets together: Not on file    Attends religious service: Not on file    Active member of club or  organization: Not on file    Attends meetings of clubs or organizations: Not on file    Relationship status: Not on file  Other Topics Concern  . Not on file  Social History Narrative  . Not on file    Outpatient Encounter Medications as of 10/17/2017  Medication Sig  . Albiglutide (TANZEUM) 30 MG PEN Inject 50 mg into the skin. Take every Tuesday  . albuterol (PROVENTIL HFA;VENTOLIN HFA) 108 (90 Base) MCG/ACT inhaler Inhale 2 puffs into the lungs every 6 (six) hours as needed.  Marland Kitchen amLODipine (NORVASC) 5 MG tablet Take 1 tablet (5 mg total) by mouth daily.  Marland Kitchen buPROPion (WELLBUTRIN XL) 300 MG 24 hr tablet Take 1 tablet (300 mg total) by mouth daily.  . empagliflozin (JARDIANCE) 25 MG TABS tablet Take 25 mg by mouth daily.  . fluticasone (FLONASE) 50 MCG/ACT nasal spray Place 2 sprays into both nostrils daily.  . fluticasone-salmeterol (ADVAIR HFA) 230-21 MCG/ACT inhaler Inhale 2 puffs into the lungs 2 (two) times daily.  . furosemide (LASIX) 20 MG tablet Take 1 tablet (20 mg total) by mouth daily.  . hyoscyamine (LEVSIN SL)  0.125 MG SL tablet Place 0.125 mg under the tongue every 6 (six) hours as needed.  Marland Kitchen ipratropium-albuterol (DUONEB) 0.5-2.5 (3) MG/3ML SOLN TAKE 3 MLS BY NEBULIZATION EVERY 8 (EIGHT) HOURS AS NEEDED.  Marland Kitchen losartan (COZAAR) 100 MG tablet Take 1 tablet (100 mg total) by mouth daily.  . metFORMIN (GLUCOPHAGE) 500 MG tablet 2 tablets bid  . montelukast (SINGULAIR) 10 MG tablet Take 1 tablet (10 mg total) by mouth daily.  . RABEprazole (ACIPHEX) 20 MG tablet Take 1 tablet (20 mg total) by mouth 2 (two) times daily before a meal.  . Tiotropium Bromide Monohydrate (SPIRIVA RESPIMAT) 2.5 MCG/ACT AERS Inhale 2 puffs into the lungs daily.  . [DISCONTINUED] amLODipine (NORVASC) 5 MG tablet Take 1 tablet (5 mg total) by mouth daily.  . [DISCONTINUED] buPROPion (WELLBUTRIN XL) 300 MG 24 hr tablet Take 1 tablet (300 mg total) by mouth daily.  . [DISCONTINUED] fluticasone (FLONASE) 50 MCG/ACT nasal spray Place 2 sprays into both nostrils daily.  . [DISCONTINUED] furosemide (LASIX) 20 MG tablet Take 1 tablet (20 mg total) by mouth daily.  . [DISCONTINUED] losartan (COZAAR) 100 MG tablet Take 1 tablet (100 mg total) by mouth daily.  . [DISCONTINUED] metoprolol succinate (TOPROL XL) 50 MG 24 hr tablet Take 1 tablet (50 mg total) by mouth daily. Take with or immediately following a meal.  . [DISCONTINUED] montelukast (SINGULAIR) 10 MG tablet Take 1 tablet (10 mg total) by mouth daily.  . [DISCONTINUED] RABEprazole (ACIPHEX) 20 MG tablet Take 1 tablet (20 mg total) by mouth 2 (two) times daily before a meal.  . [DISCONTINUED] Vitamin D, Ergocalciferol, (DRISDOL) 50000 UNITS CAPS capsule TAKE 1 CAPSULE (50,000 UNITS TOTAL) BY MOUTH EVERY 7 (SEVEN) DAYS.  Marland Kitchen metoprolol succinate (TOPROL-XL) 50 MG 24 hr tablet Take 1 tablet (50 mg total) by mouth daily. Take with or immediately following a meal.  . [DISCONTINUED] azithromycin (ZITHROMAX) 250 MG tablet Take 1 tablet (250 mg total) by mouth daily. Take 2 tabs at once on the  first day, then once daily. (Patient not taking: Reported on 10/17/2017)  . [DISCONTINUED] predniSONE (STERAPRED UNI-PAK 21 TAB) 10 MG (21) TBPK tablet Take as directed. (Patient not taking: Reported on 10/17/2017)   No facility-administered encounter medications on file as of 10/17/2017.     Review of Systems  Constitutional: Negative for appetite change and  unexpected weight change.  HENT: Negative for congestion and sinus pressure.   Respiratory: Negative for cough and chest tightness.        Breathing stable.    Cardiovascular: Negative for chest pain, palpitations and leg swelling.  Gastrointestinal: Negative for abdominal pain, diarrhea, nausea and vomiting.  Genitourinary: Negative for difficulty urinating and dysuria.  Musculoskeletal: Negative for joint swelling and myalgias.  Skin: Negative for color change and rash.  Neurological: Negative for dizziness, light-headedness and headaches.  Psychiatric/Behavioral: Negative for agitation and dysphoric mood.       Objective:    Physical Exam  Constitutional: She appears well-developed and well-nourished. No distress.  HENT:  Nose: Nose normal.  Mouth/Throat: Oropharynx is clear and moist.  Neck: Neck supple. No thyromegaly present.  Cardiovascular: Normal rate and regular rhythm.  Pulmonary/Chest: Breath sounds normal. No respiratory distress. She has no wheezes.  Abdominal: Soft. Bowel sounds are normal. There is no tenderness.  Musculoskeletal: She exhibits no edema or tenderness.  Feet:  No lesions.  Intact to light touch and pin prick.  DP pulses palpable and equal bilaterally.    Lymphadenopathy:    She has no cervical adenopathy.  Skin: No rash noted. No erythema.  Psychiatric: She has a normal mood and affect. Her behavior is normal.    BP 104/72 (BP Location: Left Arm, Patient Position: Sitting, Cuff Size: Large)   Pulse 89   Ht 5' 5"  (1.651 m)   Wt 222 lb 6.4 oz (100.9 kg)   SpO2 95%   BMI 37.01 kg/m  Wt  Readings from Last 3 Encounters:  10/17/17 222 lb 6.4 oz (100.9 kg)  09/17/17 221 lb (100.2 kg)  03/04/17 220 lb (99.8 kg)     Lab Results  Component Value Date   WBC 8.5 02/01/2017   HGB 15.8 (H) 02/01/2017   HCT 48.1 (H) 02/01/2017   PLT 181.0 02/01/2017   GLUCOSE 127 (H) 02/01/2017   CHOL 190 02/01/2017   TRIG 228.0 (H) 02/01/2017   HDL 44.00 02/01/2017   LDLDIRECT 120.0 02/01/2017   LDLCALC 148 (H) 07/24/2013   ALT 24 02/01/2017   AST 17 02/01/2017   NA 139 02/01/2017   K 4.0 02/01/2017   CL 103 02/01/2017   CREATININE 0.76 02/01/2017   BUN 15 02/01/2017   CO2 27 02/01/2017   TSH 1.01 08/21/2016   HGBA1C 6.9 (H) 02/01/2017   MICROALBUR <0.7 08/21/2016       Assessment & Plan:   Problem List Items Addressed This Visit    Acid reflux    Controlled on current regimen.  Follow.        Relevant Medications   RABEprazole (ACIPHEX) 20 MG tablet   Anxiety and depression    Doing better.  Continue current regimen.  Follow.        Relevant Medications   buPROPion (WELLBUTRIN XL) 300 MG 24 hr tablet   Asthma    Breathing stable.        Relevant Medications   montelukast (SINGULAIR) 10 MG tablet   CD (Crohn's disease) (HCC)    Bowels stable.  No abdominal pain.        Diabetes mellitus ([place])    Followed by Dr Gabriel Carina.  Discussed diet and exercise.  States sugars have been doing well.  Follow met b and a1c.       Relevant Medications   losartan (COZAAR) 100 MG tablet   Other Relevant Orders   Hemoglobin M7E   Basic metabolic panel  Microalbumin / creatinine urine ratio   Essential (primary) hypertension    Blood pressure under good control.  Continue same medication regimen.  Follow pressures.  Follow metabolic panel.        Relevant Medications   amLODipine (NORVASC) 5 MG tablet   furosemide (LASIX) 20 MG tablet   losartan (COZAAR) 100 MG tablet   metoprolol succinate (TOPROL-XL) 50 MG 24 hr tablet   Other Relevant Orders   CBC with  Differential/Platelet   TSH   Hypercholesterolemia without hypertriglyceridemia    Low cholesterol diet and exercise.  Follow lipid panel.        Relevant Medications   amLODipine (NORVASC) 5 MG tablet   furosemide (LASIX) 20 MG tablet   losartan (COZAAR) 100 MG tablet   metoprolol succinate (TOPROL-XL) 50 MG 24 hr tablet   Other Relevant Orders   Hepatic function panel   Lipid panel   Obesity (BMI 30-39.9)    Discussed diet and exercise.  Follow.        Vitamin D deficiency    Follow vitamin D level.            Einar Pheasant, MD

## 2017-10-20 ENCOUNTER — Encounter: Payer: Self-pay | Admitting: Internal Medicine

## 2017-10-20 NOTE — Assessment & Plan Note (Signed)
Low cholesterol diet and exercise.  Follow lipid panel.

## 2017-10-20 NOTE — Assessment & Plan Note (Signed)
Doing better.  Continue current regimen.  Follow.

## 2017-10-20 NOTE — Assessment & Plan Note (Signed)
Discussed diet and exercise.  Follow.  

## 2017-10-20 NOTE — Assessment & Plan Note (Signed)
Bowels stable.  No abdominal pain.

## 2017-10-20 NOTE — Assessment & Plan Note (Signed)
Follow vitamin D level.

## 2017-10-20 NOTE — Assessment & Plan Note (Signed)
Followed by Dr Gabriel Carina.  Discussed diet and exercise.  States sugars have been doing well.  Follow met b and a1c.

## 2017-10-20 NOTE — Assessment & Plan Note (Signed)
Breathing stable.

## 2017-10-20 NOTE — Assessment & Plan Note (Signed)
Controlled on current regimen.  Follow.  

## 2017-10-20 NOTE — Assessment & Plan Note (Signed)
Blood pressure under good control.  Continue same medication regimen.  Follow pressures.  Follow metabolic panel.   

## 2017-10-31 ENCOUNTER — Other Ambulatory Visit

## 2018-02-04 ENCOUNTER — Other Ambulatory Visit: Payer: Self-pay | Admitting: Internal Medicine

## 2018-02-11 ENCOUNTER — Ambulatory Visit (INDEPENDENT_AMBULATORY_CARE_PROVIDER_SITE_OTHER): Admitting: Internal Medicine

## 2018-02-11 VITALS — BP 130/78 | HR 75 | Temp 97.8°F | Resp 18 | Ht 65.0 in | Wt 217.8 lb

## 2018-02-11 DIAGNOSIS — K219 Gastro-esophageal reflux disease without esophagitis: Secondary | ICD-10-CM | POA: Diagnosis not present

## 2018-02-11 DIAGNOSIS — F419 Anxiety disorder, unspecified: Secondary | ICD-10-CM

## 2018-02-11 DIAGNOSIS — Z1239 Encounter for other screening for malignant neoplasm of breast: Secondary | ICD-10-CM | POA: Diagnosis not present

## 2018-02-11 DIAGNOSIS — Z0000 Encounter for general adult medical examination without abnormal findings: Secondary | ICD-10-CM

## 2018-02-11 DIAGNOSIS — E7800 Pure hypercholesterolemia, unspecified: Secondary | ICD-10-CM

## 2018-02-11 DIAGNOSIS — F32A Depression, unspecified: Secondary | ICD-10-CM

## 2018-02-11 DIAGNOSIS — K50919 Crohn's disease, unspecified, with unspecified complications: Secondary | ICD-10-CM

## 2018-02-11 DIAGNOSIS — F329 Major depressive disorder, single episode, unspecified: Secondary | ICD-10-CM

## 2018-02-11 DIAGNOSIS — N6452 Nipple discharge: Secondary | ICD-10-CM | POA: Diagnosis not present

## 2018-02-11 DIAGNOSIS — Z23 Encounter for immunization: Secondary | ICD-10-CM

## 2018-02-11 DIAGNOSIS — E559 Vitamin D deficiency, unspecified: Secondary | ICD-10-CM

## 2018-02-11 DIAGNOSIS — I10 Essential (primary) hypertension: Secondary | ICD-10-CM

## 2018-02-11 DIAGNOSIS — E669 Obesity, unspecified: Secondary | ICD-10-CM

## 2018-02-11 DIAGNOSIS — E119 Type 2 diabetes mellitus without complications: Secondary | ICD-10-CM

## 2018-02-11 DIAGNOSIS — J4520 Mild intermittent asthma, uncomplicated: Secondary | ICD-10-CM

## 2018-02-11 MED ORDER — ALPRAZOLAM 0.25 MG PO TABS: 0.2500 mg | ORAL_TABLET | Freq: Every day | ORAL | 0 refills | Status: DC | PRN

## 2018-02-11 NOTE — Assessment & Plan Note (Signed)
Physical today 02/11/18.  PAP 12/20/16 - negative with negative HPV.  Colonoscopy 06/2007.  Due f/u.  Mammogram 02/19/17 - birads I.  Schedule f/u mammogram.

## 2018-02-11 NOTE — Progress Notes (Signed)
[name], female   DOB: [birth year], 62 y.o.   MRN: [id number]   Subjective:    [name], female    DOB: 08-03-1955, 62 y.o.   MRN: [id number]  HPI  Patient here for her physical exam.  Seeing Dr Gabriel Carina for her diabetes.  Last a1c 7.3.  Last evaluated 12/05/17.  Recommended f/u in 4 months.  She reports she is doing well.  Increased stress. Still trying to cope and adjust to her mother's death and her brother's unexpected death.  Also increased stress with her husband's health issues.  She feels she is handling things relatively well.  Does report some increased anxiety.  Feels causes some intermittent heart racing.  No symptoms with increased activity or exertion.  Discussed further evaluation. She declines.   Trying to stay active.  Trying to watch her diet.  No chest pain.  No sob.  No acid reflux.  No abdominal pain.  Bowels moving.     Past Medical History:  Diagnosis Date  . COPD (chronic obstructive pulmonary disease) ([place])   . Crohn's disease ([place])   . Depression   . Diabetes mellitus ([place])   . Hypercholesterolemia   . Hypertension   . IBS (irritable bowel syndrome)   . Nephrolithiasis    s/p lithotripsy [facility])   Past Surgical History:  Procedure Laterality Date  . ABDOMINAL HYSTERECTOMY  1985  . APPENDECTOMY  1985  . BREAST BIOPSY  2013  . LAPAROSCOPIC CHOLECYSTECTOMY  2005   Dr Tamala Julian  . LAPAROSCOPIC SUPRACERVICAL HYSTERECTOMY  1985   left ovary not removed  . LITHOTRIPSY     x7  . POLYPECTOMY    . SHOULDER SURGERY     right (pinning)  . SHOULDER SURGERY     left (pinning)  . TONSILLECTOMY  1966   Family History  Problem Relation Age of Onset  . Heart disease Father   . Emphysema Maternal Grandmother   . COPD Mother   . Hypercholesterolemia Mother   . Emphysema Mother   . Hypertension Brother   . Emphysema Unknown        grandmother  . Breast cancer Neg Hx   . Colon cancer Neg Hx    Social History    Socioeconomic History  . Marital status: Married    Spouse name: Not on file  . Number of children: 2  . Years of education: Not on file  . Highest education level: Not on file  Occupational History    Employer: [employer] Needs  . Financial resource strain: Not on file  . Food insecurity:    Worry: Not on file    Inability: Not on file  . Transportation needs:    Medical: Not on file    Non-medical: Not on file  Tobacco Use  . Smoking status: Former Smoker    Packs/day: 2.00    Years: 15.00    Pack years: 30.00    Last attempt to quit: 04/09/1994    Years since quitting: 23.8  . Smokeless tobacco: Never Used  . Tobacco comment: QUIT IN 1990'S  Substance and Sexual Activity  . Alcohol use: Yes    Alcohol/week: 0.0 standard drinks    Comment: OCCASIONALLY  . Drug use: No  . Sexual activity: Not on file  Lifestyle  . Physical activity:    Days per week: Not on file    Minutes per session: Not on file  . Stress: Not on  file  Relationships  . Social connections:    Talks on phone: Not on file    Gets together: Not on file    Attends religious service: Not on file    Active member of club or organization: Not on file    Attends meetings of clubs or organizations: Not on file    Relationship status: Not on file  Other Topics Concern  . Not on file  Social History Narrative  . Not on file    Outpatient Encounter Medications as of 02/11/2018  Medication Sig  . Albiglutide (TANZEUM) 30 MG PEN Inject 50 mg into the skin. Take every Tuesday  . albuterol (PROVENTIL HFA;VENTOLIN HFA) 108 (90 Base) MCG/ACT inhaler Inhale 2 puffs into the lungs every 6 (six) hours as needed.  . ALPRAZolam (XANAX) 0.25 MG tablet Take 1 tablet (0.25 mg total) by mouth daily as needed for anxiety.  Marland Kitchen amLODipine (NORVASC) 5 MG tablet Take 1 tablet (5 mg total) by mouth daily.  Marland Kitchen buPROPion (WELLBUTRIN XL) 300 MG 24 hr tablet Take 1 tablet (300 mg total) by mouth daily.  .  empagliflozin (JARDIANCE) 25 MG TABS tablet Take 25 mg by mouth daily.  . fluticasone (FLONASE) 50 MCG/ACT nasal spray Place 2 sprays into both nostrils daily.  . fluticasone-salmeterol (ADVAIR HFA) 230-21 MCG/ACT inhaler Inhale 2 puffs into the lungs 2 (two) times daily.  . furosemide (LASIX) 20 MG tablet Take 1 tablet (20 mg total) by mouth daily.  . hyoscyamine (LEVSIN SL) 0.125 MG SL tablet Place 0.125 mg under the tongue every 6 (six) hours as needed.  Marland Kitchen losartan (COZAAR) 100 MG tablet Take 1 tablet (100 mg total) by mouth daily.  . metFORMIN (GLUCOPHAGE) 500 MG tablet 2 tablets bid  . metoprolol succinate (TOPROL-XL) 50 MG 24 hr tablet Take 1 tablet (50 mg total) by mouth daily. Take with or immediately following a meal.  . montelukast (SINGULAIR) 10 MG tablet Take 1 tablet (10 mg total) by mouth daily.  . RABEprazole (ACIPHEX) 20 MG tablet Take 1 tablet (20 mg total) by mouth 2 (two) times daily before a meal.  . SPIRIVA RESPIMAT 2.5 MCG/ACT AERS USE 2 INHALATIONS DAILY  . [DISCONTINUED] ipratropium-albuterol (DUONEB) 0.5-2.5 (3) MG/3ML SOLN TAKE 3 MLS BY NEBULIZATION EVERY 8 (EIGHT) HOURS AS NEEDED.   No facility-administered encounter medications on file as of 02/11/2018.     Review of Systems  Constitutional: Negative for appetite change and unexpected weight change.  HENT: Negative for congestion and sinus pressure.   Eyes: Negative for pain and visual disturbance.  Respiratory: Negative for cough, chest tightness and shortness of breath.   Cardiovascular: Negative for chest pain, palpitations and leg swelling.  Gastrointestinal: Negative for abdominal pain, diarrhea, nausea and vomiting.  Genitourinary: Negative for difficulty urinating and dysuria.  Musculoskeletal: Negative for joint swelling and myalgias.  Skin: Negative for color change and rash.  Neurological: Negative for dizziness, light-headedness and headaches.  Hematological: Negative for adenopathy. Does not  bruise/bleed easily.  Psychiatric/Behavioral: Negative for agitation and dysphoric mood.       Increased stress and anxiety as outlined.         Objective:    Physical Exam  Constitutional: She appears well-developed and well-nourished. No distress.  HENT:  Nose: Nose normal.  Mouth/Throat: Oropharynx is clear and moist.  Eyes: Conjunctivae are normal. Right eye exhibits no discharge. Left eye exhibits no discharge.  Neck: Neck supple. No thyromegaly present.  Cardiovascular: Normal rate and regular rhythm.  Pulmonary/Chest: Breath sounds normal. No respiratory distress. She has no wheezes.  Nipple discharge left breast - able to express on exam.  No nipple retraction.  No nipple discharge or nipple retraction right breast.  Could not appreciate any distinct nodules or axillary adenopathy.    Abdominal: Soft. Bowel sounds are normal. There is no tenderness.  Musculoskeletal: She exhibits no edema or tenderness.  Lymphadenopathy:    She has no cervical adenopathy.  Skin: No rash noted. No erythema.  Psychiatric: She has a normal mood and affect. Her behavior is normal.    BP 130/78 (BP Location: Left Arm, Patient Position: Sitting, Cuff Size: Large)   Pulse 75   Temp 97.8 F (36.6 C) (Oral)   Resp 18   Ht 5' 5"  (1.651 m)   Wt 217 lb 12.8 oz (98.8 kg)   SpO2 97%   BMI 36.24 kg/m  Wt Readings from Last 3 Encounters:  02/11/18 217 lb 12.8 oz (98.8 kg)  10/17/17 222 lb 6.4 oz (100.9 kg)  09/17/17 221 lb (100.2 kg)     Lab Results  Component Value Date   WBC 8.5 02/01/2017   HGB 15.8 (H) 02/01/2017   HCT 48.1 (H) 02/01/2017   PLT 181.0 02/01/2017   GLUCOSE 127 (H) 02/01/2017   CHOL 190 02/01/2017   TRIG 228.0 (H) 02/01/2017   HDL 44.00 02/01/2017   LDLDIRECT 120.0 02/01/2017   LDLCALC 148 (H) 07/24/2013   ALT 24 02/01/2017   AST 17 02/01/2017   NA 139 02/01/2017   K 4.0 02/01/2017   CL 103 02/01/2017   CREATININE 0.76 02/01/2017   BUN 15 02/01/2017   CO2 27  02/01/2017   TSH 1.01 08/21/2016   HGBA1C 6.9 (H) 02/01/2017   MICROALBUR <0.7 08/21/2016       Assessment & Plan:   Problem List Items Addressed This Visit    Acid reflux    Controlled on current regimen.  Follow.        Anxiety and depression    Discussed with her today.  On wellbutrin.  She does not feel she needs anything more at this time.  Follow.  Has good support.        Relevant Medications   ALPRAZolam (XANAX) 0.25 MG tablet   Asthma    Breathing stable.        CD (Crohn's disease) ([place])    Bowels stable.  No pain.  States received her notice for f/u colonoscopy.  Plans to call and schedule.        Diabetes mellitus ([place])    Has been followed by Dr Gabriel Carina.  Sugars as outlined.  Brought in no recorded sugar readings.  Follow sugars.  Low carb diet and exercise.  Follow met b and a1c.        Essential (primary) hypertension    Blood pressure under good control.  Continue same medication regimen.  Follow pressures.  Follow metabolic panel.        GERD (gastroesophageal reflux disease)    Controlled on current regimen.  Follow.        Health care maintenance    Physical today 02/11/18.  PAP 12/20/16 - negative with negative HPV.  Colonoscopy 06/2007.  Due f/u.  Mammogram 02/19/17 - birads I.  Schedule f/u mammogram.        Hypercholesterolemia without hypertriglyceridemia    Low cholesterol diet and exercise.  Follow lipid panel.        Nipple discharge    Check prolactin.  Schedule diagnostic mammogram with ultrasound.  Further w/up pending results.        Relevant Orders   MM DIAG BREAST TOMO BILATERAL   US BREAST LTD UNI LEFT INC AXILLA   US BREAST LTD UNI RIGHT INC AXILLA   Prolactin   Obesity (BMI 30-39.9)    Discussed diet, exercise and weight loss.  Follow.        Vitamin D deficiency    Follow vitamin D level.         Other Visit Diagnoses    Breast cancer screening       Need for immunization against influenza       Relevant Orders    Flu Vaccine QUAD 36+ mos IM (Completed)       Einar Pheasant, MD

## 2018-02-16 ENCOUNTER — Encounter: Payer: Self-pay | Admitting: Internal Medicine

## 2018-02-16 DIAGNOSIS — N6452 Nipple discharge: Secondary | ICD-10-CM | POA: Insufficient documentation

## 2018-02-16 NOTE — Assessment & Plan Note (Signed)
Controlled on current regimen.  Follow.  

## 2018-02-16 NOTE — Assessment & Plan Note (Signed)
Discussed with her today.  On wellbutrin.  She does not feel she needs anything more at this time.  Follow.  Has good support.

## 2018-02-16 NOTE — Assessment & Plan Note (Signed)
Low cholesterol diet and exercise.  Follow lipid panel.

## 2018-02-16 NOTE — Assessment & Plan Note (Signed)
Discussed diet, exercise and weight loss.  Follow.    

## 2018-02-16 NOTE — Assessment & Plan Note (Signed)
Breathing stable.

## 2018-02-16 NOTE — Assessment & Plan Note (Signed)
Blood pressure under good control.  Continue same medication regimen.  Follow pressures.  Follow metabolic panel.   

## 2018-02-16 NOTE — Assessment & Plan Note (Signed)
Bowels stable.  No pain.  States received her notice for f/u colonoscopy.  Plans to call and schedule.

## 2018-02-16 NOTE — Assessment & Plan Note (Signed)
Check prolactin.  Schedule diagnostic mammogram with ultrasound.  Further w/up pending results.

## 2018-02-16 NOTE — Assessment & Plan Note (Signed)
Follow vitamin D level.

## 2018-02-16 NOTE — Assessment & Plan Note (Signed)
Has been followed by Dr Gabriel Carina.  Sugars as outlined.  Brought in no recorded sugar readings.  Follow sugars.  Low carb diet and exercise.  Follow met b and a1c.

## 2018-02-20 ENCOUNTER — Other Ambulatory Visit (INDEPENDENT_AMBULATORY_CARE_PROVIDER_SITE_OTHER)

## 2018-02-20 DIAGNOSIS — N6452 Nipple discharge: Secondary | ICD-10-CM

## 2018-02-20 DIAGNOSIS — E7800 Pure hypercholesterolemia, unspecified: Secondary | ICD-10-CM

## 2018-02-20 DIAGNOSIS — I10 Essential (primary) hypertension: Secondary | ICD-10-CM

## 2018-02-20 DIAGNOSIS — E119 Type 2 diabetes mellitus without complications: Secondary | ICD-10-CM

## 2018-02-20 LAB — LDL CHOLESTEROL, DIRECT: LDL DIRECT: 148 mg/dL

## 2018-02-20 LAB — CBC WITH DIFFERENTIAL/PLATELET
Basophils Absolute: 118 cells/uL (ref 0–200)
Basophils Relative: 1.4 %
EOS PCT: 3 %
Eosinophils Absolute: 252 cells/uL (ref 15–500)
HCT: 50.9 % — ABNORMAL HIGH (ref 35.0–45.0)
Hemoglobin: 17.3 g/dL — ABNORMAL HIGH (ref 11.7–15.5)
Lymphs Abs: 2537 cells/uL (ref 850–3900)
MCH: 28.8 pg (ref 27.0–33.0)
MCHC: 34 g/dL (ref 32.0–36.0)
MCV: 84.7 fL (ref 80.0–100.0)
MPV: 11.5 fL (ref 7.5–12.5)
Monocytes Relative: 8 %
NEUTROS PCT: 57.4 %
Neutro Abs: 4822 cells/uL (ref 1500–7800)
Platelets: 200 10*3/uL (ref 140–400)
RBC: 6.01 10*6/uL — AB (ref 3.80–5.10)
RDW: 14.1 % (ref 11.0–15.0)
Total Lymphocyte: 30.2 %
WBC mixed population: 672 cells/uL (ref 200–950)
WBC: 8.4 10*3/uL (ref 3.8–10.8)

## 2018-02-20 LAB — POCT GLYCOSYLATED HEMOGLOBIN (HGB A1C): Hemoglobin A1C: 6.7 % — AB (ref 4.0–5.6)

## 2018-02-20 LAB — BASIC METABOLIC PANEL
BUN: 18 mg/dL (ref 6–23)
CALCIUM: 9.4 mg/dL (ref 8.4–10.5)
CO2: 22 meq/L (ref 19–32)
CREATININE: 0.83 mg/dL (ref 0.40–1.20)
Chloride: 104 mEq/L (ref 96–112)
GFR: 73.78 mL/min (ref >60.00)
Glucose, Bld: 137 mg/dL — ABNORMAL HIGH (ref 70–99)
Potassium: 4.3 mEq/L (ref 3.5–5.1)
Sodium: 140 mEq/L (ref 135–145)

## 2018-02-20 LAB — LIPID PANEL
CHOLESTEROL: 223 mg/dL — AB (ref 0–200)
HDL: 46.8 mg/dL (ref >39.00)
NONHDL: 176.41
TRIGLYCERIDES: 335 mg/dL — AB (ref 0.0–149.0)
Total CHOL/HDL Ratio: 5
VLDL: 67 mg/dL — ABNORMAL HIGH (ref 0.0–40.0)

## 2018-02-20 LAB — MICROALBUMIN / CREATININE URINE RATIO
CREATININE, U: 47.5 mg/dL
Microalb Creat Ratio: 1.5 mg/g (ref 0.0–30.0)

## 2018-02-20 LAB — TSH: TSH: 1.99 u[IU]/mL (ref 0.35–4.50)

## 2018-02-20 LAB — HEPATIC FUNCTION PANEL
ALBUMIN: 4.3 g/dL (ref 3.5–5.2)
ALT: 32 U/L (ref 0–35)
AST: 21 U/L (ref 0–37)
Alkaline Phosphatase: 61 U/L (ref 39–117)
Bilirubin, Direct: 0.1 mg/dL (ref 0.0–0.3)
TOTAL PROTEIN: 7.2 g/dL (ref 6.0–8.3)
Total Bilirubin: 0.5 mg/dL (ref 0.2–1.2)

## 2018-02-20 NOTE — Addendum Note (Signed)
Addended by: Leeanne Rio on: 02/20/2018 08:25 AM   Modules accepted: Orders

## 2018-02-21 LAB — PROLACTIN: PROLACTIN: 6.5 ng/mL

## 2018-02-25 ENCOUNTER — Other Ambulatory Visit: Payer: Self-pay | Admitting: Internal Medicine

## 2018-02-25 ENCOUNTER — Other Ambulatory Visit: Payer: Self-pay

## 2018-02-25 DIAGNOSIS — D582 Other hemoglobinopathies: Secondary | ICD-10-CM

## 2018-02-25 NOTE — Progress Notes (Signed)
Order placed for f/u cbc.

## 2018-02-27 ENCOUNTER — Ambulatory Visit
Admission: RE | Admit: 2018-02-27 | Discharge: 2018-02-27 | Disposition: A | Discharge status: Home / Self Care | Source: Ambulatory Visit | Attending: Internal Medicine | Admitting: Internal Medicine

## 2018-02-27 DIAGNOSIS — N6452 Nipple discharge: Secondary | ICD-10-CM | POA: Insufficient documentation

## 2018-03-03 ENCOUNTER — Other Ambulatory Visit (INDEPENDENT_AMBULATORY_CARE_PROVIDER_SITE_OTHER)

## 2018-03-03 ENCOUNTER — Telehealth: Payer: Self-pay | Admitting: *Deleted

## 2018-03-03 DIAGNOSIS — D582 Other hemoglobinopathies: Secondary | ICD-10-CM

## 2018-03-03 DIAGNOSIS — E7800 Pure hypercholesterolemia, unspecified: Secondary | ICD-10-CM

## 2018-03-03 LAB — CBC WITH DIFFERENTIAL/PLATELET
BASOS PCT: 1.4 % (ref 0.0–3.0)
Basophils Absolute: 0.1 10*3/uL (ref 0.0–0.1)
EOS PCT: 4.2 % (ref 0.0–5.0)
Eosinophils Absolute: 0.3 10*3/uL (ref 0.0–0.7)
HCT: 49.6 % — ABNORMAL HIGH (ref 36.0–46.0)
Hemoglobin: 16.2 g/dL — ABNORMAL HIGH (ref 12.0–15.0)
LYMPHS ABS: 2.2 10*3/uL (ref 0.7–4.0)
Lymphocytes Relative: 29.1 % (ref 12.0–46.0)
MCHC: 32.6 g/dL (ref 30.0–36.0)
MCV: 88 fl (ref 78.0–100.0)
MONO ABS: 0.8 10*3/uL (ref 0.1–1.0)
MONOS PCT: 10.1 % (ref 3.0–12.0)
NEUTROS ABS: 4.2 10*3/uL (ref 1.4–7.7)
NEUTROS PCT: 55.2 % (ref 43.0–77.0)
Platelets: 201 10*3/uL (ref 150.0–400.0)
RBC: 5.64 Mil/uL — ABNORMAL HIGH (ref 3.87–5.11)
RDW: 15 % (ref 11.5–15.5)
WBC: 7.6 10*3/uL (ref 4.0–10.5)

## 2018-03-03 MED ORDER — ROSUVASTATIN CALCIUM 10 MG PO TABS: 10.0000 mg | ORAL_TABLET | Freq: Every day | ORAL | 0 refills | Status: DC

## 2018-03-03 NOTE — Telephone Encounter (Signed)
-----   Message from Einar Pheasant, MD sent at 02/28/2018  5:43 AM EST ----- Please call and notify pt that I reviewed mammogram report and radiology is recommending further evaluation/biopsy - given changes on mammogram.  I would like to refer her to surgery for further evaluation.  If agreeable, let me know and I will place the order for the referral.  Also, let me know if she has a preference of which surgeon she sees.  If no, then will refer to Dr Bary Castilla.

## 2018-03-04 ENCOUNTER — Encounter: Payer: Self-pay | Admitting: General Surgery

## 2018-03-04 ENCOUNTER — Ambulatory Visit: Payer: Self-pay

## 2018-03-04 ENCOUNTER — Other Ambulatory Visit: Payer: Self-pay | Admitting: Internal Medicine

## 2018-03-04 ENCOUNTER — Ambulatory Visit (INDEPENDENT_AMBULATORY_CARE_PROVIDER_SITE_OTHER): Admitting: General Surgery

## 2018-03-04 ENCOUNTER — Other Ambulatory Visit: Payer: Self-pay

## 2018-03-04 VITALS — BP 135/74 | HR 87 | Temp 97.7°F | Resp 16 | Ht 65.5 in | Wt 218.0 lb

## 2018-03-04 DIAGNOSIS — D242 Benign neoplasm of left breast: Secondary | ICD-10-CM | POA: Diagnosis not present

## 2018-03-04 DIAGNOSIS — N6452 Nipple discharge: Secondary | ICD-10-CM

## 2018-03-04 DIAGNOSIS — R928 Other abnormal and inconclusive findings on diagnostic imaging of breast: Secondary | ICD-10-CM

## 2018-03-04 NOTE — Progress Notes (Signed)
[name], female   DOB: 1955/08/31, 62 y.o.   MRN: [id number]  Chief Complaint  Patient presents with  . Breast Problem    HPI Amanda Wiley is a 62 y.o. female.  who presents for a breast evaluation referred by Dr Einar Pheasant. The most recent mammogram and left breast ultrasound was done on 02-27-18. She states Dr Nicki Reaper saw left nipple discharge at her physical. She states it was clear and sticky. She has seen random spots in her bra before but thought it was food dropping down on her shirt. Patient does perform regular self breast checks and gets regular mammograms done.([place]). Deneis breast pain.  There is no history of trauma.    HPI  Past Medical History:  Diagnosis Date  . COPD (chronic obstructive pulmonary disease) ([place])   . Crohn's disease ([place])   . Depression   . Diabetes mellitus ([place])   . Fracture, foot    post fall  . Hypercholesterolemia   . Hypertension   . IBS (irritable bowel syndrome)   . Nephrolithiasis    s/p lithotripsy [facility])    Past Surgical History:  Procedure Laterality Date  . ABDOMINAL HYSTERECTOMY  1985  . APPENDECTOMY  1985  . BREAST EXCISIONAL BIOPSY Right 2013   benign  . COLONOSCOPY  2014  . LAPAROSCOPIC CHOLECYSTECTOMY  2005   Dr Tamala Julian  . LAPAROSCOPIC SUPRACERVICAL HYSTERECTOMY  1985   left ovary not removed  . LITHOTRIPSY     x7  . OOPHORECTOMY    . POLYPECTOMY    . SHOULDER SURGERY     right (pinning)  . SHOULDER SURGERY     left (pinning)  . TONSILLECTOMY  1966    Family History  Problem Relation Age of Onset  . Heart disease Father   . Emphysema Maternal Grandmother   . COPD Mother   . Hypercholesterolemia Mother   . Emphysema Mother   . Hypertension Brother   . Emphysema Unknown        grandmother  . Breast cancer Neg Hx   . Colon cancer Neg Hx     Social History Social History   Tobacco Use  . Smoking status: Former Smoker    Packs/day: 2.00    Years: 15.00     Pack years: 30.00    Last attempt to quit: 04/09/1994    Years since quitting: 23.9  . Smokeless tobacco: Never Used  . Tobacco comment: QUIT IN 1990'S  Substance Use Topics  . Alcohol use: Not Currently    Alcohol/week: 0.0 standard drinks  . Drug use: No    Allergies  Allergen Reactions  . Avelox [Moxifloxacin Hcl In Nacl] Hives, Shortness Of Breath and Other (See Comments)  . Moxifloxacin Hives and Shortness Of Breath  . Allegra [Fexofenadine Hcl] Other (See Comments)    headaches  . Sulfa Antibiotics Other (See Comments)    Difficulty breathing, rash, tongue swelling  . Tegretol [Carbamazepine] Other (See Comments)    Other reaction(s): Other (See Comments) Vomiting Vomiting   . Iodine Rash  . Ioxaglate Rash  . Ivp Dye [Iodinated Diagnostic Agents] Rash    Current Outpatient Medications  Medication Sig Dispense Refill  . Albiglutide (TANZEUM) 30 MG PEN Inject 50 mg into the skin. Take every Tuesday    . albuterol (PROVENTIL HFA;VENTOLIN HFA) 108 (90 Base) MCG/ACT inhaler Inhale 2 puffs into the lungs every 6 (six) hours as needed. 54 g 3  . ALPRAZolam (XANAX) 0.25 MG  tablet Take 1 tablet (0.25 mg total) by mouth daily as needed for anxiety. 30 tablet 0  . amLODipine (NORVASC) 5 MG tablet Take 1 tablet (5 mg total) by mouth daily. 90 tablet 3  . buPROPion (WELLBUTRIN XL) 300 MG 24 hr tablet Take 1 tablet (300 mg total) by mouth daily. 90 tablet 3  . empagliflozin (JARDIANCE) 25 MG TABS tablet Take 25 mg by mouth daily.    . fluticasone (FLONASE) 50 MCG/ACT nasal spray Place 2 sprays into both nostrils daily. 48 g 3  . fluticasone-salmeterol (ADVAIR HFA) 230-21 MCG/ACT inhaler Inhale 2 puffs into the lungs 2 (two) times daily. 3 Inhaler 3  . furosemide (LASIX) 20 MG tablet Take 1 tablet (20 mg total) by mouth daily. 90 tablet 3  . hyoscyamine (LEVSIN SL) 0.125 MG SL tablet Place 0.125 mg under the tongue every 6 (six) hours as needed.    Marland Kitchen losartan (COZAAR) 100 MG tablet  Take 1 tablet (100 mg total) by mouth daily. 90 tablet 3  . metFORMIN (GLUCOPHAGE) 500 MG tablet 2 tablets bid 360 tablet 3  . metoprolol succinate (TOPROL-XL) 50 MG 24 hr tablet Take 1 tablet (50 mg total) by mouth daily. Take with or immediately following a meal. 90 tablet 3  . montelukast (SINGULAIR) 10 MG tablet Take 1 tablet (10 mg total) by mouth daily. 90 tablet 3  . RABEprazole (ACIPHEX) 20 MG tablet Take 1 tablet (20 mg total) by mouth 2 (two) times daily before a meal. 180 tablet 3  . rosuvastatin (CRESTOR) 10 MG tablet Take 1 tablet (10 mg total) by mouth daily. 30 tablet 0  . SPIRIVA RESPIMAT 2.5 MCG/ACT AERS USE 2 INHALATIONS DAILY 12 g 4   No current facility-administered medications for this visit.     Review of Systems Review of Systems  Constitutional: Negative.   Respiratory: Negative.   Cardiovascular: Negative.     Blood pressure 135/74, pulse 87, temperature 97.7 F (36.5 C), temperature source Skin, resp. rate 16, height 5' 5.5" (1.664 m), weight 218 lb (98.9 kg), SpO2 95 %.  Physical Exam Physical Exam  Constitutional: She is oriented to person, place, and time. She appears well-developed and well-nourished.  HENT:  Mouth/Throat: Oropharynx is clear and moist.  Eyes: Conjunctivae are normal. No scleral icterus.  Neck: Neck supple.  Cardiovascular: Normal rate, regular rhythm and normal heart sounds.  Pulmonary/Chest: Effort normal and breath sounds normal. Right breast exhibits no inverted nipple, no mass, no nipple discharge, no skin change and no tenderness. Left breast exhibits nipple discharge. Left breast exhibits no inverted nipple, no mass, no skin change and no tenderness.  Left breast and nipple > right breast and nipple. Single duct discharge.     Lymphadenopathy:    She has no cervical adenopathy.    She has no axillary adenopathy.       Left: No supraclavicular adenopathy present.  Neurological: She is alert and oriented to person, place, and  time.  Skin: Skin is warm and dry.  Psychiatric: Her behavior is normal.    Data Reviewed February 19, 2017 [facility] mammograms and February 27, 2018 [place] mammograms and left breast ultrasound reviewed.  Ultrasound showed a 6 mm mass within a dilated duct in the retroareolar area of the left breast 6 o'clock position.  BI-RADS-4.  Ultrasound examination of the left breast in the 6 o'clock position, 1 cm from the nipple showed a maximum ductal diameter of 0.5 cm grade within this was a hypoechoic mass  measuring 0.4 x 0.44 x 0.56 cm.  This is most likely a papilloma.  Biopsy was recommended to confirm benign pathology.  The patient was amenable to proceed.  10 cc of 0.5% Xylocaine with 0.25% Marcaine with 1 to 200,000 units of epinephrine was utilized and well-tolerated after alcohol prep.  ChloraPrep was then applied to the skin.  A 10-gauge Encor biopsy device was then passed through the lesion from a lateral to medial direction at which time 7 core samples were obtained with complete removal.  Scant bleeding was noted.  A post biopsy ribbon clip was placed.  The skin defect was closed with benzoin followed by a Steri-Strip, Telfa and Tegaderm dressing.  Ice pack provided.  Assessment    Papilloma of the left breast.    Plan    The patient would be contacted when pathology is available.  She is aware there may be delayed due to the upcoming Thanksgiving holiday.  She will make use of ice on and off tonight to minimize discomfort and bruising.  Written instructions provided.     HPI, Physical Exam, Assessment and Plan have been scribed under the direction and in the presence of Robert Bellow, MD. Amanda Fetch, RN   I have completed the exam and reviewed the above documentation for accuracy and completeness.  I agree with the above.  Haematologist has been used and any errors in dictation or transcription are unintentional.  Hervey Ard, M.D., F.A.C.S.  Forest Gleason  [place] 03/04/2018, 8:51 PM

## 2018-03-04 NOTE — Patient Instructions (Addendum)
The patient is aware to call back for any questions or new concerns. May shower May remove dressing in 2-3 days Steri strips will gradually come off over 2-3 weeks May use an Ice pack as needed for comfort

## 2018-03-04 NOTE — Progress Notes (Signed)
Order placed for referral to Dr Bary Castilla.

## 2018-03-05 ENCOUNTER — Telehealth: Payer: Self-pay | Admitting: *Deleted

## 2018-03-05 NOTE — Telephone Encounter (Signed)
Notified patient as instructed, patient pleased. Discussed follow-up appointments, patient agrees  

## 2018-03-05 NOTE — Telephone Encounter (Signed)
-----   Message from Robert Bellow, MD sent at 03/05/2018  9:37 AM EST ----- Please notify the patient the report was fine: papilloma as expected. Arrange a f/u in one month. Thanks.  ----- Message ----- From: Interface, Lab In [place] Sent: 03/05/2018   8:55 AM EST To: Robert Bellow, MD

## 2018-03-10 ENCOUNTER — Other Ambulatory Visit: Payer: Self-pay | Admitting: Internal Medicine

## 2018-03-10 DIAGNOSIS — D582 Other hemoglobinopathies: Secondary | ICD-10-CM

## 2018-03-10 NOTE — Progress Notes (Signed)
Order placed for hematology referral.  

## 2018-04-04 DIAGNOSIS — D45 Polycythemia vera: Secondary | ICD-10-CM | POA: Insufficient documentation

## 2018-04-04 NOTE — Progress Notes (Signed)
[place]  [phone] [phone]  ID: Amanda Wiley OB: November 24, 1955  MR#: [id number]  CSN#:[id number]  Patient Care Team: Einar Pheasant, MD as PCP - General (Internal Medicine)  CHIEF COMPLAINT: Polycythemia  INTERVAL HISTORY: Patient is a 62 year old female who was noted to have a persistently elevated hemoglobin on routine blood work.  She currently feels well and is asymptomatic.  She has no neurologic complaints.  She denies any recent fevers or illnesses.  She has a good appetite and denies weight loss.  She has no chest pain or shortness of breath.  She denies any nausea, vomiting, constipation, or diarrhea.  She has no urinary complaints.  Patient feels at her baseline offers no specific complaints today.  REVIEW OF SYSTEMS:   Review of Systems  Constitutional: Negative.  Negative for fever, malaise/fatigue and weight loss.  Respiratory: Negative.  Negative for cough, hemoptysis and shortness of breath.   Cardiovascular: Negative.  Negative for chest pain and leg swelling.  Gastrointestinal: Negative.  Negative for abdominal pain, blood in stool and melena.  Genitourinary: Negative.  Negative for dysuria.  Musculoskeletal: Negative.  Negative for back pain.  Skin: Negative.  Negative for rash.  Neurological: Negative.  Negative for focal weakness, weakness and headaches.  Psychiatric/Behavioral: Negative.  The patient is not nervous/anxious.     As per HPI. Otherwise, a complete review of systems is negative.  PAST MEDICAL HISTORY: Past Medical History:  Diagnosis Date  . COPD (chronic obstructive pulmonary disease) ([place])   . Crohn's disease ([place])   . Depression   . Diabetes mellitus ([place])   . Fracture, foot    post fall  . Hypercholesterolemia   . Hypertension   . IBS (irritable bowel syndrome)   . Nephrolithiasis    s/p lithotripsy [facility])    PAST SURGICAL HISTORY: Past Surgical History:  Procedure  Laterality Date  . ABDOMINAL HYSTERECTOMY  1985  . APPENDECTOMY  1985  . BREAST EXCISIONAL BIOPSY Right 2013   benign  . COLONOSCOPY  2014  . LAPAROSCOPIC CHOLECYSTECTOMY  2005   Dr Tamala Julian  . LAPAROSCOPIC SUPRACERVICAL HYSTERECTOMY  1985   left ovary not removed  . LITHOTRIPSY     x7  . OOPHORECTOMY    . POLYPECTOMY    . SHOULDER SURGERY     right (pinning)  . SHOULDER SURGERY     left (pinning)  . TONSILLECTOMY  1966    FAMILY HISTORY: Family History  Problem Relation Age of Onset  . Heart disease Father   . Emphysema Maternal Grandmother   . COPD Mother   . Hypercholesterolemia Mother   . Emphysema Mother   . Hypertension Brother   . Emphysema Other        grandmother  . Breast cancer Neg Hx   . Colon cancer Neg Hx     ADVANCED DIRECTIVES (Y/N):  N  HEALTH MAINTENANCE: Social History   Tobacco Use  . Smoking status: Former Smoker    Packs/day: 2.00    Years: 15.00    Pack years: 30.00    Last attempt to quit: 04/09/1994    Years since quitting: 24.0  . Smokeless tobacco: Never Used  . Tobacco comment: QUIT IN 1990'S  Substance Use Topics  . Alcohol use: Not Currently    Alcohol/week: 0.0 standard drinks  . Drug use: No     Colonoscopy:  PAP:  Bone density:  Lipid panel:  Allergies  Allergen Reactions  . Avelox [Moxifloxacin Hcl In  Nacl] Hives, Shortness Of Breath and Other (See Comments)  . Moxifloxacin Hives and Shortness Of Breath  . Allegra [Fexofenadine Hcl] Other (See Comments)    headaches  . Ibuprofen Swelling  . Sulfa Antibiotics Other (See Comments)    Difficulty breathing, rash, tongue swelling  . Tegretol [Carbamazepine] Other (See Comments)    Other reaction(s): Other (See Comments) Vomiting Vomiting   . Iodine Rash  . Ioxaglate Rash  . Ivp Dye [Iodinated Diagnostic Agents] Rash    Current Outpatient Medications  Medication Sig Dispense Refill  . Albiglutide (TANZEUM) 30 MG PEN Inject 50 mg into the skin. Take every Tuesday     . albuterol (PROVENTIL HFA;VENTOLIN HFA) 108 (90 Base) MCG/ACT inhaler Inhale 2 puffs into the lungs every 6 (six) hours as needed. 54 g 3  . ALPRAZolam (XANAX) 0.25 MG tablet Take 1 tablet (0.25 mg total) by mouth daily as needed for anxiety. 30 tablet 0  . amLODipine (NORVASC) 5 MG tablet Take 1 tablet (5 mg total) by mouth daily. 90 tablet 3  . buPROPion (WELLBUTRIN XL) 300 MG 24 hr tablet Take 1 tablet (300 mg total) by mouth daily. 90 tablet 3  . empagliflozin (JARDIANCE) 25 MG TABS tablet Take 25 mg by mouth daily.    . fluticasone (FLONASE) 50 MCG/ACT nasal spray Place 2 sprays into both nostrils daily. 48 g 3  . fluticasone-salmeterol (ADVAIR HFA) 230-21 MCG/ACT inhaler Inhale 2 puffs into the lungs 2 (two) times daily. 3 Inhaler 3  . furosemide (LASIX) 20 MG tablet Take 1 tablet (20 mg total) by mouth daily. 90 tablet 3  . hyoscyamine (LEVSIN SL) 0.125 MG SL tablet Place 0.125 mg under the tongue every 6 (six) hours as needed.    Marland Kitchen losartan (COZAAR) 100 MG tablet Take 1 tablet (100 mg total) by mouth daily. 90 tablet 3  . metFORMIN (GLUCOPHAGE) 500 MG tablet 2 tablets bid 360 tablet 3  . metoprolol succinate (TOPROL-XL) 50 MG 24 hr tablet Take 1 tablet (50 mg total) by mouth daily. Take with or immediately following a meal. 90 tablet 3  . montelukast (SINGULAIR) 10 MG tablet Take 1 tablet (10 mg total) by mouth daily. 90 tablet 3  . RABEprazole (ACIPHEX) 20 MG tablet Take 1 tablet (20 mg total) by mouth 2 (two) times daily before a meal. 180 tablet 3  . rosuvastatin (CRESTOR) 10 MG tablet Take 1 tablet (10 mg total) by mouth daily. 30 tablet 0  . SPIRIVA RESPIMAT 2.5 MCG/ACT AERS USE 2 INHALATIONS DAILY 12 g 4   No current facility-administered medications for this visit.     OBJECTIVE: Vitals:   04/07/18 1352  BP: (!) 142/81  Pulse: 77  Resp: 18  Temp: 98.6 F (37 C)     Body mass index is 38.18 kg/m.    ECOG FS:0 - Asymptomatic  General: Well-developed, well-nourished, no  acute distress. Eyes: Pink conjunctiva, anicteric sclera. HEENT: Normocephalic, moist mucous membranes, clear oropharnyx. Lungs: Clear to auscultation bilaterally. Heart: Regular rate and rhythm. No rubs, murmurs, or gallops. Abdomen: Soft, nontender, nondistended. No organomegaly noted, normoactive bowel sounds. Musculoskeletal: No edema, cyanosis, or clubbing. Neuro: Alert, answering all questions appropriately. Cranial nerves grossly intact. Skin: No rashes or petechiae noted. Psych: Normal affect. Lymphatics: No cervical, calvicular, axillary or inguinal LAD.   LAB RESULTS:  Lab Results  Component Value Date   NA 140 02/20/2018   K 4.3 02/20/2018   CL 104 02/20/2018   CO2 22 02/20/2018  GLUCOSE 137 (H) 02/20/2018   BUN 18 02/20/2018   CREATININE 0.83 02/20/2018   CALCIUM 9.4 02/20/2018   PROT 7.2 02/20/2018   ALBUMIN 4.3 02/20/2018   AST 21 02/20/2018   ALT 32 02/20/2018   ALKPHOS 61 02/20/2018   BILITOT 0.5 02/20/2018   GFRNONAA 55 (L) 04/09/2013   GFRAA >60 04/09/2013    Lab Results  Component Value Date   WBC 7.7 04/07/2018   NEUTROABS 4.2 03/03/2018   HGB 15.1 (H) 04/07/2018   HCT 46.5 (H) 04/07/2018   MCV 86.8 04/07/2018   PLT 191 04/07/2018     STUDIES: No results found.  ASSESSMENT: Polycythemia  PLAN:    1. Polycythemia: Patient's hemoglobin only remains mildly elevated at 15.1.  Her iron stores are within normal limits.  The remainder of her laboratory work including carbon monoxide level, JAK-2 mutation, hemochromatosis mutation, erythropoietin levels are pending at time of dictation.  No intervention is needed at this time.  Patient does not require phlebotomy.  Return to clinic in 3 months with repeat laboratory work and further evaluation.  If patient's hemoglobin remained stable and all her other laboratory work is either negative or within normal limits, she likely can be discharged from clinic.  I spent a total of 45 minutes face-to-face  with the patient of which greater than 50% of the visit was spent in counseling and coordination of care as detailed above.   Patient expressed understanding and was in agreement with this plan. She also understands that She can call clinic at any time with any questions, concerns, or complaints.    Lloyd Huger, MD   04/09/2018 8:10 AM

## 2018-04-07 ENCOUNTER — Other Ambulatory Visit: Payer: Self-pay

## 2018-04-07 ENCOUNTER — Encounter (INDEPENDENT_AMBULATORY_CARE_PROVIDER_SITE_OTHER): Payer: Self-pay

## 2018-04-07 ENCOUNTER — Encounter: Payer: Self-pay | Admitting: Oncology

## 2018-04-07 ENCOUNTER — Inpatient Hospital Stay

## 2018-04-07 ENCOUNTER — Inpatient Hospital Stay: Attending: Oncology | Admitting: Oncology

## 2018-04-07 DIAGNOSIS — D751 Secondary polycythemia: Secondary | ICD-10-CM | POA: Insufficient documentation

## 2018-04-07 LAB — CBC
HEMATOCRIT: 46.5 % — AB (ref 36.0–46.0)
HEMOGLOBIN: 15.1 g/dL — AB (ref 12.0–15.0)
MCH: 28.2 pg (ref 26.0–34.0)
MCHC: 32.5 g/dL (ref 30.0–36.0)
MCV: 86.8 fL (ref 80.0–100.0)
Platelets: 191 10*3/uL (ref 150–400)
RBC: 5.36 MIL/uL — ABNORMAL HIGH (ref 3.87–5.11)
RDW: 14.6 % (ref 11.5–15.5)
WBC: 7.7 10*3/uL (ref 4.0–10.5)
nRBC: 0 % (ref 0.0–0.2)

## 2018-04-07 LAB — IRON AND TIBC
IRON: 59 ug/dL (ref 28–170)
Saturation Ratios: 13 % (ref 10.4–31.8)
TIBC: 441 ug/dL (ref 250–450)
UIBC: 382 ug/dL

## 2018-04-07 LAB — FERRITIN: FERRITIN: 15 ng/mL (ref 11–307)

## 2018-04-07 NOTE — Progress Notes (Signed)
Patient here today for initial evaluation regarding elevated hemoglobin.

## 2018-04-08 ENCOUNTER — Ambulatory Visit: Admitting: *Deleted

## 2018-04-08 DIAGNOSIS — D242 Benign neoplasm of left breast: Secondary | ICD-10-CM

## 2018-04-08 NOTE — Patient Instructions (Addendum)
The patient is aware to call back for any questions or new concerns.  Continue mammograms with Dr Ronda Fairly as scheduled.

## 2018-04-08 NOTE — Progress Notes (Signed)
[name], female   DOB: September 15, 1955, 62 y.o.   MRN: [id number]    Patient came in today for a one month wound check post left breast Encore biopsy.  The wound is clean, with no signs of infection noted. Follow up as needed or sooner if issues arise. She did see Dr Grayland Ormond for polycythemia. Continue mammograms with Dr Ronda Fairly as scheduled.

## 2018-04-10 LAB — ERYTHROPOIETIN: ERYTHROPOIETIN: 17.7 m[IU]/mL (ref 2.6–18.5)

## 2018-04-11 LAB — CARBON MONOXIDE, BLOOD (PERFORMED AT REF LAB): CARBON MONOXIDE, BLOOD: 2.4 % (ref 0.0–3.6)

## 2018-04-11 LAB — HEMOCHROMATOSIS DNA-PCR(C282Y,H63D)

## 2018-04-14 LAB — JAK2 GENOTYPR

## 2018-04-15 ENCOUNTER — Encounter: Payer: Self-pay | Admitting: Emergency Medicine

## 2018-04-15 ENCOUNTER — Other Ambulatory Visit: Payer: Self-pay

## 2018-04-15 DIAGNOSIS — Z79899 Other long term (current) drug therapy: Secondary | ICD-10-CM | POA: Diagnosis not present

## 2018-04-15 DIAGNOSIS — Z7984 Long term (current) use of oral hypoglycemic drugs: Secondary | ICD-10-CM | POA: Diagnosis not present

## 2018-04-15 DIAGNOSIS — Y929 Unspecified place or not applicable: Secondary | ICD-10-CM | POA: Diagnosis not present

## 2018-04-15 DIAGNOSIS — J449 Chronic obstructive pulmonary disease, unspecified: Secondary | ICD-10-CM | POA: Insufficient documentation

## 2018-04-15 DIAGNOSIS — E119 Type 2 diabetes mellitus without complications: Secondary | ICD-10-CM | POA: Insufficient documentation

## 2018-04-15 DIAGNOSIS — Y999 Unspecified external cause status: Secondary | ICD-10-CM | POA: Insufficient documentation

## 2018-04-15 DIAGNOSIS — T23211A Burn of second degree of right thumb (nail), initial encounter: Secondary | ICD-10-CM | POA: Diagnosis not present

## 2018-04-15 DIAGNOSIS — I10 Essential (primary) hypertension: Secondary | ICD-10-CM | POA: Insufficient documentation

## 2018-04-15 DIAGNOSIS — Z87891 Personal history of nicotine dependence: Secondary | ICD-10-CM | POA: Insufficient documentation

## 2018-04-15 DIAGNOSIS — T23001A Burn of unspecified degree of right hand, unspecified site, initial encounter: Secondary | ICD-10-CM | POA: Diagnosis present

## 2018-04-15 DIAGNOSIS — X19XXXA Contact with other heat and hot substances, initial encounter: Secondary | ICD-10-CM | POA: Insufficient documentation

## 2018-04-15 DIAGNOSIS — K5090 Crohn's disease, unspecified, without complications: Secondary | ICD-10-CM | POA: Diagnosis not present

## 2018-04-15 DIAGNOSIS — T23201A Burn of second degree of right hand, unspecified site, initial encounter: Secondary | ICD-10-CM | POA: Insufficient documentation

## 2018-04-15 DIAGNOSIS — Y939 Activity, unspecified: Secondary | ICD-10-CM | POA: Diagnosis not present
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
## 2018-04-16 ENCOUNTER — Other Ambulatory Visit (INDEPENDENT_AMBULATORY_CARE_PROVIDER_SITE_OTHER)

## 2018-04-16 ENCOUNTER — Emergency Department
Admission: EM | Admit: 2018-04-16 | Discharge: 2018-04-16 | Disposition: A | Discharge status: Home / Self Care | Attending: Emergency Medicine | Admitting: Emergency Medicine

## 2018-04-16 DIAGNOSIS — T23211A Burn of second degree of right thumb (nail), initial encounter: Secondary | ICD-10-CM

## 2018-04-16 DIAGNOSIS — E7800 Pure hypercholesterolemia, unspecified: Secondary | ICD-10-CM | POA: Diagnosis not present

## 2018-04-16 DIAGNOSIS — T23091A Burn of unspecified degree of multiple sites of right wrist and hand, initial encounter: Secondary | ICD-10-CM

## 2018-04-16 LAB — HEPATIC FUNCTION PANEL
ALT: 30 U/L (ref 0–35)
AST: 23 U/L (ref 0–37)
Albumin: 4.2 g/dL (ref 3.5–5.2)
Alkaline Phosphatase: 59 U/L (ref 39–117)
BILIRUBIN DIRECT: 0 mg/dL (ref 0.0–0.3)
Total Bilirubin: 0.6 mg/dL (ref 0.2–1.2)
Total Protein: 7.5 g/dL (ref 6.0–8.3)

## 2018-04-16 MED ORDER — OXYCODONE-ACETAMINOPHEN 5-325 MG PO TABS
1.0000 | ORAL_TABLET | Freq: Once | ORAL | Status: AC
  Administered 2018-04-16: 1 via ORAL
  Filled 2018-04-16: qty 1

## 2018-04-16 MED ORDER — ACETAMINOPHEN 325 MG PO TABS
650.0000 mg | ORAL_TABLET | Freq: Once | ORAL | Status: AC
  Administered 2018-04-16: 650 mg via ORAL
  Filled 2018-04-16: qty 2

## 2018-04-16 MED ORDER — SILVER SULFADIAZINE 1 % EX CREA
TOPICAL_CREAM | Freq: Once | CUTANEOUS | Status: AC
  Administered 2018-04-16: 1 via TOPICAL

## 2018-04-16 NOTE — ED Notes (Signed)
Guaze dressing applied to the hand and thumb with silvadene cream.
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
## 2018-04-16 NOTE — Discharge Instructions (Signed)
Please keep your burns clean and dry.  We provided an ointment that you can apply twice daily after washing your hand gently with cool water and soap.  After you apply a layer of the ointment you should apply clean gauze to the wounds.  Follow-up with your doctor later today or with the [facility] burn center.  Use over-the-counter pain medication as needed and continue using cold packs for comfort.

## 2018-04-17 ENCOUNTER — Other Ambulatory Visit: Payer: Self-pay | Admitting: Internal Medicine

## 2018-04-17 DIAGNOSIS — T23069A Burn of unspecified degree of back of unspecified hand, initial encounter: Secondary | ICD-10-CM
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
## 2018-04-18 ENCOUNTER — Ambulatory Visit: Admitting: Internal Medicine

## 2018-04-30 ENCOUNTER — Other Ambulatory Visit: Payer: Self-pay | Admitting: Internal Medicine

## 2018-04-30 MED ORDER — FLUTICASONE-SALMETEROL 230-21 MCG/ACT IN AERO: 2.0000 | INHALATION_SPRAY | Freq: Two times a day (BID) | RESPIRATORY_TRACT | 0 refills | Status: DC

## 2018-04-30 MED ORDER — ALBUTEROL SULFATE HFA 108 (90 BASE) MCG/ACT IN AERS: 2.0000 | INHALATION_SPRAY | Freq: Four times a day (QID) | RESPIRATORY_TRACT | 0 refills | Status: DC | PRN

## 2018-04-30 NOTE — Telephone Encounter (Signed)
Pt made aware no future refills until seen by Dr. Mortimer Fries. She scheduled appt 05/09/18. Rx printed for 3 mos on both Advair and Albuterol.

## 2018-05-09 ENCOUNTER — Encounter: Payer: Self-pay | Admitting: Internal Medicine

## 2018-05-09 ENCOUNTER — Ambulatory Visit (INDEPENDENT_AMBULATORY_CARE_PROVIDER_SITE_OTHER): Admitting: Internal Medicine

## 2018-05-09 ENCOUNTER — Ambulatory Visit: Admitting: Internal Medicine

## 2018-05-09 VITALS — BP 120/80 | HR 91 | Resp 16 | Ht 65.0 in | Wt 216.0 lb

## 2018-05-09 DIAGNOSIS — J449 Chronic obstructive pulmonary disease, unspecified: Secondary | ICD-10-CM | POA: Diagnosis not present

## 2018-05-09 MED ORDER — ALBUTEROL SULFATE HFA 108 (90 BASE) MCG/ACT IN AERS: 2.0000 | INHALATION_SPRAY | Freq: Four times a day (QID) | RESPIRATORY_TRACT | 3 refills | Status: DC | PRN

## 2018-05-09 MED ORDER — FLUTICASONE-SALMETEROL 230-21 MCG/ACT IN AERO: 2.0000 | INHALATION_SPRAY | Freq: Two times a day (BID) | RESPIRATORY_TRACT | 3 refills | Status: DC

## 2018-05-09 NOTE — Progress Notes (Signed)
Amanda Wiley      MRN# [id number] Amanda Wiley 08-09-1955     Brief History: 63 yo F History of COPD, Crohn's disease, seeing [facility] for COPD optimization. Now on Advair, Spiriva, allergy control. Pulmonary function testing 12/22/2015 FEV1 to 69% FEV1/FVC 73% RV 72 TLC 76 ERV 27 DLCO corrected 114  Flow loops within next expiratory scooping Impression: Mild obstruction, clinical correlation advised. Significant reduction in ERV, could be related to abdominal obesity, clinical correlation advised.   6 minute walk test: Total distance 1102 feet, 336 m, low saturation 94%, highest heart rate 98.  CC follow up COPD  HPI Last COPD exacerbation 05/2016 No signs of infection No signs of COPD exacerbation at this time  No wheezing intermittent SOB  GERD under control No signs of CHF No lower ext swelling  She needs her inhalers She does NOT feel well if she misses her dose  +compliant with inhalers     Current Outpatient Medications:  .  Albiglutide (TANZEUM) 30 MG PEN, Inject 50 mg into the skin. Take every Tuesday, Disp: , Rfl:  .  albuterol (PROVENTIL HFA;VENTOLIN HFA) 108 (90 Base) MCG/ACT inhaler, Inhale 2 puffs into the lungs every 6 (six) hours as needed., Disp: 54 g, Rfl: 0 .  ALPRAZolam (XANAX) 0.25 MG tablet, Take 1 tablet (0.25 mg total) by mouth daily as needed for anxiety., Disp: 30 tablet, Rfl: 0 .  amLODipine (NORVASC) 5 MG tablet, Take 1 tablet (5 mg total) by mouth daily., Disp: 90 tablet, Rfl: 3 .  buPROPion (WELLBUTRIN XL) 300 MG 24 hr tablet, Take 1 tablet (300 mg total) by mouth daily., Disp: 90 tablet, Rfl: 3 .  empagliflozin (JARDIANCE) 25 MG TABS tablet, Take 25 mg by mouth daily., Disp: , Rfl:  .  fluticasone (FLONASE) 50 MCG/ACT nasal spray, Place 2 sprays into both nostrils daily., Disp: 48 g, Rfl: 3 .  fluticasone-salmeterol (ADVAIR HFA) 230-21 MCG/ACT inhaler, Inhale 2 puffs into the lungs 2 (two)  times daily., Disp: 3 Inhaler, Rfl: 0 .  furosemide (LASIX) 20 MG tablet, Take 1 tablet (20 mg total) by mouth daily., Disp: 90 tablet, Rfl: 3 .  hyoscyamine (LEVSIN SL) 0.125 MG SL tablet, Place 0.125 mg under the tongue every 6 (six) hours as needed., Disp: , Rfl:  .  losartan (COZAAR) 100 MG tablet, Take 1 tablet (100 mg total) by mouth daily., Disp: 90 tablet, Rfl: 3 .  metFORMIN (GLUCOPHAGE) 500 MG tablet, 2 tablets bid, Disp: 360 tablet, Rfl: 3 .  metoprolol succinate (TOPROL-XL) 50 MG 24 hr tablet, Take 1 tablet (50 mg total) by mouth daily. Take with or immediately following a meal., Disp: 90 tablet, Rfl: 3 .  montelukast (SINGULAIR) 10 MG tablet, Take 1 tablet (10 mg total) by mouth daily., Disp: 90 tablet, Rfl: 3 .  RABEprazole (ACIPHEX) 20 MG tablet, Take 1 tablet (20 mg total) by mouth 2 (two) times daily before a meal., Disp: 180 tablet, Rfl: 3 .  rosuvastatin (CRESTOR) 10 MG tablet, Take 1 tablet (10 mg total) by mouth daily., Disp: 30 tablet, Rfl: 0 .  SPIRIVA RESPIMAT 2.5 MCG/ACT AERS, USE 2 INHALATIONS DAILY, Disp: 12 g, Rfl: 4   Review of Systems  Constitutional: Negative for chills and fever.  HENT: Negative for congestion and sore throat.   Respiratory: Positive for cough, sputum production, shortness of breath and wheezing. Negative for hemoptysis.   Gastrointestinal: Negative for heartburn, nausea and vomiting.  Genitourinary: Negative for dysuria.  Musculoskeletal: Negative for myalgias.  Psychiatric/Behavioral: Negative for substance abuse (.vs).      Allergies:  Avelox [moxifloxacin hcl in nacl]; Moxifloxacin; Allegra [fexofenadine hcl]; Ibuprofen; Sulfa antibiotics; Tegretol [carbamazepine]; Iodine; Ioxaglate; and Ivp dye [iodinated diagnostic agents]  BP 120/80 (BP Location: Left Arm, Cuff Size: Large)   Pulse 91   Resp 16   Ht 5' 5"  (1.651 m)   Wt 216 lb (98 kg)   SpO2 94%   BMI 35.94 kg/m   Physical Examination:   GENERAL:NAD, no fevers, chills, no  weakness no fatigue HEAD: Normocephalic, atraumatic.  EYES: PERLA, EOMI No scleral icterus.  MOUTH: Moist mucosal membrane.  EAR, NOSE, THROAT: Clear without exudates. No external lesions.  NECK: Supple. No thyromegaly.  No JVD.  PULMONARY: CTA B/L no wheezing, rhonchi, crackles CARDIOVASCULAR: S1 and S2. Regular rate and rhythm. No murmurs GASTROINTESTINAL: Soft, nontender, nondistended. Positive bowel sounds.  MUSCULOSKELETAL: No swelling, clubbing, or edema.  NEUROLOGIC: No gross focal neurological deficits. 5/5 strength all extremities SKIN: No ulceration, lesions, rashes, or cyanosis.  PSYCHIATRIC: Insight, judgment intact. -depression -anxiety ALL OTHER ROS ARE NEGATIVE         Assessment and Plan: 63 yo pleasant white female with MOD COPD GOLD STAGE C Seems to be doing well on current regimen, she does feel SOB when she misses her inhalers  Continue Advair HFA continue SPIRIVA RESPIMAT 2.5 ALBUTEROL AS NEEDED AVOID ALLERGENS   Patient satisfied with Plan of action and management. All questions answered Follow pp in 1 year  Demetrie Borge Patricia Pesa, M.D.  [facility]  Medical Director [place] Director [facility]io-Pulmonary Department

## 2018-05-09 NOTE — Patient Instructions (Signed)
Continue inhalers as prescribed Follow up in 1 year

## 2018-06-06 ENCOUNTER — Telehealth: Payer: Self-pay | Admitting: Internal Medicine

## 2018-06-06 NOTE — Telephone Encounter (Signed)
Spoke to patient, scheduled for apt Monday afternoon.

## 2018-06-09 ENCOUNTER — Ambulatory Visit: Admitting: Internal Medicine

## 2018-06-19 ENCOUNTER — Ambulatory Visit (INDEPENDENT_AMBULATORY_CARE_PROVIDER_SITE_OTHER): Admitting: Internal Medicine

## 2018-06-19 ENCOUNTER — Other Ambulatory Visit: Payer: Self-pay

## 2018-06-19 ENCOUNTER — Encounter: Payer: Self-pay | Admitting: Internal Medicine

## 2018-06-19 DIAGNOSIS — E1169 Type 2 diabetes mellitus with other specified complication: Secondary | ICD-10-CM

## 2018-06-19 DIAGNOSIS — E119 Type 2 diabetes mellitus without complications: Secondary | ICD-10-CM

## 2018-06-19 DIAGNOSIS — F419 Anxiety disorder, unspecified: Secondary | ICD-10-CM | POA: Diagnosis not present

## 2018-06-19 DIAGNOSIS — J329 Chronic sinusitis, unspecified: Secondary | ICD-10-CM

## 2018-06-19 DIAGNOSIS — F329 Major depressive disorder, single episode, unspecified: Secondary | ICD-10-CM

## 2018-06-19 DIAGNOSIS — K50919 Crohn's disease, unspecified, with unspecified complications: Secondary | ICD-10-CM | POA: Diagnosis not present

## 2018-06-19 DIAGNOSIS — E669 Obesity, unspecified: Secondary | ICD-10-CM

## 2018-06-19 DIAGNOSIS — K219 Gastro-esophageal reflux disease without esophagitis: Secondary | ICD-10-CM

## 2018-06-19 DIAGNOSIS — E7800 Pure hypercholesterolemia, unspecified: Secondary | ICD-10-CM

## 2018-06-19 DIAGNOSIS — D45 Polycythemia vera: Secondary | ICD-10-CM

## 2018-06-19 DIAGNOSIS — E1159 Type 2 diabetes mellitus with other circulatory complications: Secondary | ICD-10-CM

## 2018-06-19 DIAGNOSIS — J4520 Mild intermittent asthma, uncomplicated: Secondary | ICD-10-CM

## 2018-06-19 DIAGNOSIS — E559 Vitamin D deficiency, unspecified: Secondary | ICD-10-CM

## 2018-06-19 DIAGNOSIS — I152 Hypertension secondary to endocrine disorders: Secondary | ICD-10-CM

## 2018-06-19 DIAGNOSIS — I10 Essential (primary) hypertension: Secondary | ICD-10-CM

## 2018-06-19 DIAGNOSIS — F32A Depression, unspecified: Secondary | ICD-10-CM

## 2018-06-19 MED ORDER — FLUCONAZOLE 150 MG PO TABS: ORAL_TABLET | ORAL | 0 refills | Status: DC

## 2018-06-19 MED ORDER — PREDNISONE 10 MG PO TABS: ORAL_TABLET | ORAL | 0 refills | Status: DC

## 2018-06-19 MED ORDER — DOXYCYCLINE HYCLATE 100 MG PO TABS: 100.0000 mg | ORAL_TABLET | Freq: Two times a day (BID) | ORAL | 0 refills | Status: DC

## 2018-06-19 MED ORDER — ROSUVASTATIN CALCIUM 10 MG PO TABS: 10.0000 mg | ORAL_TABLET | Freq: Every day | ORAL | 2 refills | Status: DC

## 2018-06-19 NOTE — Progress Notes (Signed)
[name], female   DOB: 01/03/1956, 63 y.o.   MRN: [id number]   Subjective:    [name], female    DOB: [birth year], 63 y.o.   MRN: [id number]  HPI  Patient here for a scheduled follow up.  She reports she has been doing relatively well.  Still with increased stress dealing with family deaths, but overall handling things relatively well.  Trying to stay active.  Trying to watch her diet.  Sugars have been better.  Seeing endocrinology.  Last evaluated by Dr Gabriel Carina 05/27/18.  No changes made.  Recommended f/u in 4 months.  Last saw Dr Mortimer Fries 04/26/18.  Breathing has been relatively stable.  Has noticed over the last week, increased sinus pressure.  Increased mucus production - green.  Some chest congestion.  Starting to cough.  Using singulair and flonase.  Using her inhalers.  No documented fever.  No acid reflux.  No abdominal pain.  Bowels moving.     Past Medical History:  Diagnosis Date  . COPD (chronic obstructive pulmonary disease) ([place])   . Crohn's disease ([place])   . Depression   . Diabetes mellitus ([place])   . Fracture, foot    post fall  . Hypercholesterolemia   . Hypertension   . IBS (irritable bowel syndrome)   . Nephrolithiasis    s/p lithotripsy [facility])   Past Surgical History:  Procedure Laterality Date  . ABDOMINAL HYSTERECTOMY  1985  . APPENDECTOMY  1985  . BREAST EXCISIONAL BIOPSY Right 2013   benign  . COLONOSCOPY  2014  . LAPAROSCOPIC CHOLECYSTECTOMY  2005   Dr Tamala Julian  . LAPAROSCOPIC SUPRACERVICAL HYSTERECTOMY  1985   left ovary not removed  . LITHOTRIPSY     x7  . OOPHORECTOMY    . POLYPECTOMY    . SHOULDER SURGERY     right (pinning)  . SHOULDER SURGERY     left (pinning)  . TONSILLECTOMY  1966   Family History  Problem Relation Age of Onset  . Heart disease Father   . Emphysema Maternal Grandmother   . COPD Mother   . Hypercholesterolemia Mother   . Emphysema Mother   . Hypertension Brother   .  Emphysema Other        grandmother  . Breast cancer Neg Hx   . Colon cancer Neg Hx    Social History   Socioeconomic History  . Marital status: Married    Spouse name: Not on file  . Number of children: 2  . Years of education: Not on file  . Highest education level: Not on file  Occupational History    Employer: [employer] Needs  . Financial resource strain: Not on file  . Food insecurity:    Worry: Not on file    Inability: Not on file  . Transportation needs:    Medical: Not on file    Non-medical: Not on file  Tobacco Use  . Smoking status: Former Smoker    Packs/day: 2.00    Years: 15.00    Pack years: 30.00    Last attempt to quit: 04/09/1994    Years since quitting: 24.2  . Smokeless tobacco: Never Used  . Tobacco comment: QUIT IN 1990'S  Substance and Sexual Activity  . Alcohol use: Not Currently    Alcohol/week: 0.0 standard drinks  . Drug use: No  . Sexual activity: Not on file  Lifestyle  . Physical activity:    Days  per week: Not on file    Minutes per session: Not on file  . Stress: Not on file  Relationships  . Social connections:    Talks on phone: Not on file    Gets together: Not on file    Attends religious service: Not on file    Active member of club or organization: Not on file    Attends meetings of clubs or organizations: Not on file    Relationship status: Not on file  Other Topics Concern  . Not on file  Social History Narrative  . Not on file    Outpatient Encounter Medications as of 06/19/2018  Medication Sig  . Albiglutide (TANZEUM) 30 MG PEN Inject 50 mg into the skin. Take every Tuesday  . albuterol (PROVENTIL HFA;VENTOLIN HFA) 108 (90 Base) MCG/ACT inhaler Inhale 2 puffs into the lungs every 6 (six) hours as needed.  . ALPRAZolam (XANAX) 0.25 MG tablet Take 1 tablet (0.25 mg total) by mouth daily as needed for anxiety.  Marland Kitchen amLODipine (NORVASC) 5 MG tablet Take 1 tablet (5 mg total) by mouth daily.  Marland Kitchen buPROPion  (WELLBUTRIN XL) 300 MG 24 hr tablet Take 1 tablet (300 mg total) by mouth daily.  . empagliflozin (JARDIANCE) 25 MG TABS tablet Take 25 mg by mouth daily.  . fluticasone (FLONASE) 50 MCG/ACT nasal spray Place 2 sprays into both nostrils daily.  . fluticasone-salmeterol (ADVAIR HFA) 230-21 MCG/ACT inhaler Inhale 2 puffs into the lungs 2 (two) times daily.  . furosemide (LASIX) 20 MG tablet Take 1 tablet (20 mg total) by mouth daily.  . hyoscyamine (LEVSIN SL) 0.125 MG SL tablet Place 0.125 mg under the tongue every 6 (six) hours as needed.  Marland Kitchen losartan (COZAAR) 100 MG tablet Take 1 tablet (100 mg total) by mouth daily.  . metFORMIN (GLUCOPHAGE) 500 MG tablet 2 tablets bid  . metoprolol succinate (TOPROL-XL) 50 MG 24 hr tablet Take 1 tablet (50 mg total) by mouth daily. Take with or immediately following a meal.  . montelukast (SINGULAIR) 10 MG tablet Take 1 tablet (10 mg total) by mouth daily.  . RABEprazole (ACIPHEX) 20 MG tablet Take 1 tablet (20 mg total) by mouth 2 (two) times daily before a meal.  . rosuvastatin (CRESTOR) 10 MG tablet Take 1 tablet (10 mg total) by mouth daily.  Marland Kitchen SPIRIVA RESPIMAT 2.5 MCG/ACT AERS USE 2 INHALATIONS DAILY  . [DISCONTINUED] rosuvastatin (CRESTOR) 10 MG tablet Take 1 tablet (10 mg total) by mouth daily.  Marland Kitchen doxycycline (VIBRA-TABS) 100 MG tablet Take 1 tablet (100 mg total) by mouth 2 (two) times daily.  . fluconazole (DIFLUCAN) 150 MG tablet May repeat x 1 if persistent symptoms after 3 days.  . predniSONE (DELTASONE) 10 MG tablet Take 6 tablets x 1 day and then decrease by 1/2 tablet per day until down to zero mg.   No facility-administered encounter medications on file as of 06/19/2018.     Review of Systems  Constitutional: Negative for appetite change and unexpected weight change.  HENT: Positive for congestion, postnasal drip and sinus pressure.   Respiratory: Positive for cough. Negative for chest tightness and shortness of breath.   Cardiovascular:  Negative for chest pain, palpitations and leg swelling.  Gastrointestinal: Negative for abdominal pain, diarrhea, nausea and vomiting.  Genitourinary: Negative for difficulty urinating and dysuria.  Musculoskeletal: Negative for joint swelling and myalgias.  Skin: Negative for color change and rash.  Neurological: Negative for dizziness, light-headedness and headaches.  Psychiatric/Behavioral: Negative for agitation  and dysphoric mood.       Objective:    Physical Exam Constitutional:      General: She is not in acute distress.    Appearance: Normal appearance.  HENT:     Head:     Comments: Tenderness to palpation over the sinuses.      Nose: Congestion present.     Comments: Slightly erythematous turbinates.      Mouth/Throat:     Pharynx: No oropharyngeal exudate or posterior oropharyngeal erythema.  Neck:     Musculoskeletal: Neck supple. No muscular tenderness.     Thyroid: No thyromegaly.  Cardiovascular:     Rate and Rhythm: Normal rate and regular rhythm.  Pulmonary:     Effort: No respiratory distress.     Breath sounds: Normal breath sounds. No wheezing.     Comments: Some minimal increased cough with forced expiration.   Abdominal:     General: Bowel sounds are normal.     Palpations: Abdomen is soft.     Tenderness: There is no abdominal tenderness.  Musculoskeletal:        General: No swelling or tenderness.  Lymphadenopathy:     Cervical: No cervical adenopathy.  Skin:    Findings: No erythema or rash.  Neurological:     Mental Status: She is alert.  Psychiatric:        Mood and Affect: Mood normal.        Behavior: Behavior normal.     BP 120/70   Pulse 76   Temp 99.2 F (37.3 C) (Oral)   Wt 218 lb 12.8 oz (99.2 kg)   SpO2 94%   BMI 36.41 kg/m  Wt Readings from Last 3 Encounters:  06/19/18 218 lb 12.8 oz (99.2 kg)  05/09/18 216 lb (98 kg)  04/15/18 233 lb 0.4 oz (105.7 kg)     Lab Results  Component Value Date   WBC 7.7 04/07/2018    HGB 15.1 (H) 04/07/2018   HCT 46.5 (H) 04/07/2018   PLT 191 04/07/2018   GLUCOSE 137 (H) 02/20/2018   CHOL 223 (H) 02/20/2018   TRIG 335.0 (H) 02/20/2018   HDL 46.80 02/20/2018   LDLDIRECT 148.0 02/20/2018   LDLCALC 148 (H) 07/24/2013   ALT 30 04/16/2018   AST 23 04/16/2018   NA 140 02/20/2018   K 4.3 02/20/2018   CL 104 02/20/2018   CREATININE 0.83 02/20/2018   BUN 18 02/20/2018   CO2 22 02/20/2018   TSH 1.99 02/20/2018   HGBA1C 6.7 (A) 02/20/2018   MICROALBUR <0.7 02/20/2018       Assessment & Plan:   Problem List Items Addressed This Visit    Acid reflux    Controlled on current regimen.  Follow.       Anxiety and depression    Discussed with her today.  On wellbutrin.  Stable.  Follow.  Does not feel needs any further intervention.       Asthma    Treat current infection as outlined.  rx given for prednisone if needed.  Continue singulair and inhalers.        Relevant Medications   predniSONE (DELTASONE) 10 MG tablet   CD (Crohn's disease) ([place])    Bowels stable.  Followed by GI.       Diabetes mellitus ([place])    Reports sugars have been better.  Seeing Dr Gabriel Carina.  Just evaluated 05/27/18.  No changes made.  Recommended f/u in 4 months.  Relevant Medications   rosuvastatin (CRESTOR) 10 MG tablet   Essential (primary) hypertension    Blood pressure under good control.  Continue same medication regimen.  Follow pressures.  Follow metabolic panel.        Relevant Medications   rosuvastatin (CRESTOR) 10 MG tablet   Other Relevant Orders   Basic metabolic panel   Hypercholesterolemia without hypertriglyceridemia    Has been off crestor.  Needs rx.  Restart.  Follow lipid panel and liver function tests.  Low cholesterol diet and exercise.        Relevant Medications   rosuvastatin (CRESTOR) 10 MG tablet   Other Relevant Orders   Hepatic function panel   Lipid panel   Obesity, diabetes, and hypertension syndrome ([place])    Discussed diet and exercise.   Follow.        Relevant Medications   rosuvastatin (CRESTOR) 10 MG tablet   Polycythemia vera ([place])    Has seen hematology.  Worked up.  Released.  Follow cbc.        Relevant Medications   predniSONE (DELTASONE) 10 MG tablet   fluconazole (DIFLUCAN) 150 MG tablet   doxycycline (VIBRA-TABS) 100 MG tablet   Other Relevant Orders   CBC with Differential/Platelet   Sinusitis    Symptoms c/w sinus infection and possible URI.  With underlying asthma.  Continue singulair, flonase and inhalers.  Also treat with doxycycline.  Gave rx for prednisone to have if needed.  Follow.        Relevant Medications   predniSONE (DELTASONE) 10 MG tablet   fluconazole (DIFLUCAN) 150 MG tablet   doxycycline (VIBRA-TABS) 100 MG tablet   Vitamin D deficiency    Follow vitamin D level.        Relevant Orders   VITAMIN D 25 Hydroxy (Vit-D Deficiency, Fractures)       Einar Pheasant, MD

## 2018-06-19 NOTE — Patient Instructions (Signed)
Pneumovax (pneumonia vaccine) - recommend after you are well.

## 2018-06-21 ENCOUNTER — Encounter: Payer: Self-pay | Admitting: Internal Medicine

## 2018-06-21 DIAGNOSIS — J329 Chronic sinusitis, unspecified: Secondary | ICD-10-CM | POA: Insufficient documentation

## 2018-06-21 NOTE — Assessment & Plan Note (Signed)
Treat current infection as outlined.  rx given for prednisone if needed.  Continue singulair and inhalers.

## 2018-06-21 NOTE — Assessment & Plan Note (Signed)
Discussed with her today.  On wellbutrin.  Stable.  Follow.  Does not feel needs any further intervention.

## 2018-06-21 NOTE — Assessment & Plan Note (Signed)
Symptoms c/w sinus infection and possible URI.  With underlying asthma.  Continue singulair, flonase and inhalers.  Also treat with doxycycline.  Gave rx for prednisone to have if needed.  Follow.

## 2018-06-21 NOTE — Assessment & Plan Note (Signed)
Discussed diet and exercise.  Follow.  

## 2018-06-21 NOTE — Assessment & Plan Note (Signed)
Has been off crestor.  Needs rx.  Restart.  Follow lipid panel and liver function tests.  Low cholesterol diet and exercise.

## 2018-06-21 NOTE — Assessment & Plan Note (Signed)
Controlled on current regimen.  Follow.  

## 2018-06-21 NOTE — Assessment & Plan Note (Signed)
Follow vitamin D level.

## 2018-06-21 NOTE — Assessment & Plan Note (Signed)
Reports sugars have been better.  Seeing Dr Gabriel Carina.  Just evaluated 05/27/18.  No changes made.  Recommended f/u in 4 months.

## 2018-06-21 NOTE — Assessment & Plan Note (Signed)
Blood pressure under good control.  Continue same medication regimen.  Follow pressures.  Follow metabolic panel.   

## 2018-06-21 NOTE — Assessment & Plan Note (Signed)
Has seen hematology.  Worked up.  Released.  Follow cbc.

## 2018-06-21 NOTE — Assessment & Plan Note (Signed)
Bowels stable.  Followed by GI.

## 2018-07-08 ENCOUNTER — Ambulatory Visit: Payer: Self-pay | Admitting: Internal Medicine

## 2018-07-08 ENCOUNTER — Inpatient Hospital Stay: Admitting: Oncology

## 2018-07-08 ENCOUNTER — Inpatient Hospital Stay

## 2018-07-08 MED ORDER — PREDNISONE 10 MG PO TABS: ORAL_TABLET | ORAL | 0 refills | Status: DC

## 2018-07-08 NOTE — Addendum Note (Signed)
Addended by: Alisa Graff on: 07/08/2018 08:11 PM   Modules accepted: Orders

## 2018-07-08 NOTE — Telephone Encounter (Signed)
Pt was given doxy, prednisone and diflucan on 06/19/18. Per triage, has completed both abx and steroid. Are you ok with sending in prednisone or would you like to do a virtual visit

## 2018-07-08 NOTE — Telephone Encounter (Signed)
Pt. Reports she is feeling better, but still has a productive with greenish-yellow mucus. States she has finished her antibiotic and steroid. Feels like she needs one more course of steroid to get completely over this. Please advise pt.  Answer Assessment - Initial Assessment Questions 1. ONSET: "When did the cough begin?"      Started about a month ago 2. SEVERITY: "How bad is the cough today?"      Mild 3. RESPIRATORY DISTRESS: "Describe your breathing."      No distress 4. FEVER: "Do you have a fever?" If so, ask: "What is your temperature, how was it measured, and when did it start?"     No 5. SPUTUM: "Describe the color of your sputum" (clear, white, yellow, green)     Green-yellow 6. HEMOPTYSIS: "Are you coughing up any blood?" If so ask: "How much?" (flecks, streaks, tablespoons, etc.)     No 7. CARDIAC HISTORY: "Do you have any history of heart disease?" (e.g., heart attack, congestive heart failure)      No 8. LUNG HISTORY: "Do you have any history of lung disease?"  (e.g., pulmonary embolus, asthma, emphysema)     COPD 9. PE RISK FACTORS: "Do you have a history of blood clots?" (or: recent major surgery, recent prolonged travel, bedridden)     No 10. OTHER SYMPTOMS: "Do you have any other symptoms?" (e.g., runny nose, wheezing, chest pain)       Minor wheezing 11. PREGNANCY: "Is there any chance you are pregnant?" "When was your last menstrual period?"       No 12. TRAVEL: "Have you traveled out of the country in the last month?" (e.g., travel history, exposures)       No  Protocols used: [place]

## 2018-07-08 NOTE — Telephone Encounter (Signed)
Spoke to pt.  Discussed prednisone use and possible side effects and risk.  She is doing better.  Still some cough and wheezing.  Feels just needs a another short course of prednisone.  Is staying home and avoiding going out in public.  Prednisone taper sent in.  Pt will call if any change or worsening problems.

## 2018-07-08 NOTE — Telephone Encounter (Signed)
Need to know if she is just coughing or is she having wheezing, etc.  If just cough, would prefer not to give another round of prednisone.  Can give a cough suppressant.  Also need to confirm she is using inhalers, etc.

## 2018-07-08 NOTE — Telephone Encounter (Signed)
Patient says she is using inhalers and she is not wheezing but is a little tight in her chest. She does feel better but is coughing quite a bit. She has had this in the past and can usually tell with the tightness in her chest she will start wheezing. Steroids help.

## 2018-07-09 ENCOUNTER — Other Ambulatory Visit: Payer: Self-pay

## 2018-07-09 MED ORDER — PREDNISONE 10 MG PO TABS: ORAL_TABLET | ORAL | 0 refills | Status: DC

## 2018-07-10 ENCOUNTER — Other Ambulatory Visit

## 2018-10-31 ENCOUNTER — Other Ambulatory Visit: Payer: Self-pay

## 2018-10-31 ENCOUNTER — Ambulatory Visit (INDEPENDENT_AMBULATORY_CARE_PROVIDER_SITE_OTHER): Admitting: Internal Medicine

## 2018-10-31 DIAGNOSIS — F329 Major depressive disorder, single episode, unspecified: Secondary | ICD-10-CM

## 2018-10-31 DIAGNOSIS — E119 Type 2 diabetes mellitus without complications: Secondary | ICD-10-CM

## 2018-10-31 DIAGNOSIS — K219 Gastro-esophageal reflux disease without esophagitis: Secondary | ICD-10-CM

## 2018-10-31 DIAGNOSIS — J4520 Mild intermittent asthma, uncomplicated: Secondary | ICD-10-CM

## 2018-10-31 DIAGNOSIS — K50919 Crohn's disease, unspecified, with unspecified complications: Secondary | ICD-10-CM

## 2018-10-31 DIAGNOSIS — F419 Anxiety disorder, unspecified: Secondary | ICD-10-CM | POA: Diagnosis not present

## 2018-10-31 DIAGNOSIS — D45 Polycythemia vera: Secondary | ICD-10-CM

## 2018-10-31 DIAGNOSIS — E7800 Pure hypercholesterolemia, unspecified: Secondary | ICD-10-CM

## 2018-10-31 DIAGNOSIS — I10 Essential (primary) hypertension: Secondary | ICD-10-CM

## 2018-10-31 MED ORDER — METFORMIN HCL 500 MG PO TABS: ORAL_TABLET | ORAL | 3 refills | Status: DC

## 2018-10-31 MED ORDER — HYOSCYAMINE SULFATE 0.125 MG SL SUBL: 0.1250 mg | SUBLINGUAL_TABLET | Freq: Two times a day (BID) | SUBLINGUAL | 0 refills | Status: DC | PRN

## 2018-10-31 MED ORDER — FUROSEMIDE 20 MG PO TABS: 20.0000 mg | ORAL_TABLET | Freq: Every day | ORAL | 3 refills | Status: DC

## 2018-10-31 NOTE — Progress Notes (Signed)
[name], female   DOB: [birth year], 63 y.o.   MRN: [id number]   Virtual Visit via telephone Note  This visit type was conducted due to national recommendations for restrictions regarding the COVID-19 pandemic (e.g. social distancing).  This format is felt to be most appropriate for this patient at this time.  All issues noted in this document were discussed and addressed.  No physical exam was performed (except for noted visual exam findings with Video Visits).   I connected with [name] by telephone and verified that I am speaking with the correct person using two identifiers. Location patient: home Location provider: work  Persons participating in the telephone visit: patient, provider  I discussed the limitations, risks, security and privacy concerns of performing an evaluation and management service by telephone and the availability of in person appointments. The patient expressed understanding and agreed to proceed.   Reason for visit: scheduled follow up  HPI: She reports she is doing relatively well.  Saw Dr Bary Castilla 02/2018.  S/p biopsy - intraductal papilloma.  States was told would need f/u in one year.  No chest pain.  No sob.  Feels her breathing is doing well.  Sees Dr Mortimer Fries.  Last evaluated 04/2018.  Stable.  Recommended f/u in one year.  Sees Dr Gabriel Carina for f/u of her sugars.  She is not eating out.  Has cut down on fried foods.  Blood sugars averaging 130s.  Blood pressure doing well.  Taking crestor.  No abdominal pain.  Bowels moving.  Weight is 210 pounds.  Discussed diet and exercise.  Has seen Dr Grayland Ormond for f/u polycythemia.  Released.      ROS: See pertinent positives and negatives per HPI.  Past Medical History:  Diagnosis Date  . COPD (chronic obstructive pulmonary disease) ([place])   . Crohn's disease ([place])   . Depression   . Diabetes mellitus ([place])   . Fracture, foot    post fall  . Hypercholesterolemia   . Hypertension   . IBS (irritable  bowel syndrome)   . Nephrolithiasis    s/p lithotripsy [facility])    Past Surgical History:  Procedure Laterality Date  . ABDOMINAL HYSTERECTOMY  1985  . APPENDECTOMY  1985  . BREAST EXCISIONAL BIOPSY Right 2013   benign  . COLONOSCOPY  2014  . LAPAROSCOPIC CHOLECYSTECTOMY  2005   Dr Tamala Julian  . LAPAROSCOPIC SUPRACERVICAL HYSTERECTOMY  1985   left ovary not removed  . LITHOTRIPSY     x7  . OOPHORECTOMY    . POLYPECTOMY    . SHOULDER SURGERY     right (pinning)  . SHOULDER SURGERY     left (pinning)  . TONSILLECTOMY  1966    Family History  Problem Relation Age of Onset  . Heart disease Father   . Emphysema Maternal Grandmother   . COPD Mother   . Hypercholesterolemia Mother   . Emphysema Mother   . Hypertension Brother   . Emphysema Other        grandmother  . Breast cancer Neg Hx   . Colon cancer Neg Hx     SOCIAL HX: reviewed.    Current Outpatient Medications:  .  Dulaglutide (TRULICITY) 1.5 WU/9.8JX SOPN, INJECT 1.5 MG UNDER THE SKIN EVERY 7 DAYS, Disp: , Rfl:  .  albuterol (PROVENTIL HFA;VENTOLIN HFA) 108 (90 Base) MCG/ACT inhaler, Inhale 2 puffs into the lungs every 6 (six) hours as needed., Disp: 54 g, Rfl: 3 .  ALPRAZolam Duanne Moron)  0.25 MG tablet, Take 1 tablet (0.25 mg total) by mouth daily as needed for anxiety., Disp: 30 tablet, Rfl: 0 .  amLODipine (NORVASC) 5 MG tablet, Take 1 tablet (5 mg total) by mouth daily., Disp: 90 tablet, Rfl: 3 .  buPROPion (WELLBUTRIN XL) 300 MG 24 hr tablet, Take 1 tablet (300 mg total) by mouth daily., Disp: 90 tablet, Rfl: 3 .  empagliflozin (JARDIANCE) 25 MG TABS tablet, Take 25 mg by mouth daily., Disp: , Rfl:  .  fluticasone (FLONASE) 50 MCG/ACT nasal spray, Place 2 sprays into both nostrils daily., Disp: 48 g, Rfl: 3 .  fluticasone-salmeterol (ADVAIR HFA) 230-21 MCG/ACT inhaler, Inhale 2 puffs into the lungs 2 (two) times daily., Disp: 3 Inhaler, Rfl: 3 .  furosemide (LASIX) 20 MG tablet, Take 1 tablet (20 mg  total) by mouth daily., Disp: 90 tablet, Rfl: 3 .  hyoscyamine (LEVSIN SL) 0.125 MG SL tablet, Place 1 tablet (0.125 mg total) under the tongue 2 (two) times daily as needed., Disp: 30 tablet, Rfl: 0 .  losartan (COZAAR) 100 MG tablet, Take 1 tablet (100 mg total) by mouth daily., Disp: 90 tablet, Rfl: 3 .  metFORMIN (GLUCOPHAGE) 500 MG tablet, 2 tablets bid, Disp: 360 tablet, Rfl: 3 .  metoprolol succinate (TOPROL-XL) 50 MG 24 hr tablet, Take 1 tablet (50 mg total) by mouth daily. Take with or immediately following a meal., Disp: 90 tablet, Rfl: 3 .  montelukast (SINGULAIR) 10 MG tablet, Take 1 tablet (10 mg total) by mouth daily., Disp: 90 tablet, Rfl: 3 .  RABEprazole (ACIPHEX) 20 MG tablet, Take 1 tablet (20 mg total) by mouth 2 (two) times daily before a meal., Disp: 180 tablet, Rfl: 3 .  SPIRIVA RESPIMAT 2.5 MCG/ACT AERS, USE 2 INHALATIONS DAILY, Disp: 12 g, Rfl: 4  EXAM:  VITALS per patient if applicable: 211/94  GENERAL: alert.  Sounds to be in no acute distress.  Answering questions appropriately.    PSYCH/NEURO: pleasant and cooperative, no obvious depression or anxiety, speech and thought processing grossly intact  ASSESSMENT AND PLAN:  Discussed the following assessment and plan:  Anxiety and depression On wellbutrin.  Doing well.  Follow.    Asthma Breathing is doing well.  Feels good.  Follow.    CD (Crohn's disease) Bowels stable.  Followed by GI.    Diabetes mellitus Low carb diet and exercise.  Followed by Dr Gabriel Carina.  Sugars as outlined.  Follow met b and a1c.    Essential (primary) hypertension Blood pressure under good control.  Continue same medication regimen.  Follow pressures.  Follow metabolic panel.    GERD (gastroesophageal reflux disease) Controlled.  Follow.    Hypercholesterolemia without hypertriglyceridemia Low cholesterol diet and exercise.  Follow lipid panel.  Off crestor.    Polycythemia vera ([place]) Has seen hematology.  Released.  Follow  cbc.      I discussed the assessment and treatment plan with the patient. The patient was provided an opportunity to ask questions and all were answered. The patient agreed with the plan and demonstrated an understanding of the instructions.   The patient was advised to call back or seek an in-person evaluation if the symptoms worsen or if the condition fails to improve as anticipated.  I provided 25 minutes of non-face-to-face time during this encounter.   Einar Pheasant, MD

## 2018-11-02 ENCOUNTER — Encounter: Payer: Self-pay | Admitting: Internal Medicine

## 2018-11-02 NOTE — Assessment & Plan Note (Signed)
Controlled.  Follow.

## 2018-11-02 NOTE — Assessment & Plan Note (Signed)
Low carb diet and exercise.  Followed by Dr Gabriel Carina.  Sugars as outlined.  Follow met b and a1c.

## 2018-11-02 NOTE — Assessment & Plan Note (Signed)
On wellbutrin.  Doing well.  Follow.

## 2018-11-02 NOTE — Assessment & Plan Note (Signed)
Low cholesterol diet and exercise.  Follow lipid panel.  Off crestor.

## 2018-11-02 NOTE — Assessment & Plan Note (Signed)
Blood pressure under good control.  Continue same medication regimen.  Follow pressures.  Follow metabolic panel.   

## 2018-11-02 NOTE — Assessment & Plan Note (Signed)
Breathing is doing well.  Feels good.  Follow.

## 2018-11-02 NOTE — Assessment & Plan Note (Signed)
Bowels stable.  Followed by GI.

## 2018-11-02 NOTE — Assessment & Plan Note (Signed)
Has seen hematology.  Released.  Follow cbc.

## 2018-11-12 ENCOUNTER — Telehealth: Payer: Self-pay | Admitting: Internal Medicine

## 2018-11-12 NOTE — Telephone Encounter (Signed)
Medication Refill - Medication:  furosemide (LASIX) 20 MG tablet metFORMIN (GLUCOPHAGE) 500 MG tablet amLODipine (NORVASC) 5 MG tablet hyoscyamine (LEVSIN SL) 0.125 MG SL tablet buPROPion (WELLBUTRIN XL) 300 MG 24 hr tablet losartan (COZAAR) 100 MG tablet metoprolol succinate (TOPROL-XL) 50 MG 24 hr tablet montelukast (SINGULAIR) 10 MG tablet RABEprazole (ACIPHEX) 20 MG tablet   Has the patient contacted their pharmacy? Yes advised to call office. Please advise and patient would like a call back when it has been done.   Preferred Pharmacy (with phone number or street name):  [facility] [address] [place], [place] [address]  Agent: Please be advised that RX refills may take up to 3 business days. We ask that you follow-up with your pharmacy.

## 2018-11-13 ENCOUNTER — Other Ambulatory Visit: Payer: Self-pay

## 2018-11-13 MED ORDER — METFORMIN HCL 500 MG PO TABS: ORAL_TABLET | ORAL | 3 refills | Status: DC

## 2018-11-13 MED ORDER — BUPROPION HCL ER (XL) 300 MG PO TB24: 300.0000 mg | ORAL_TABLET | Freq: Every day | ORAL | 3 refills | Status: DC

## 2018-11-13 MED ORDER — AMLODIPINE BESYLATE 5 MG PO TABS: 5.0000 mg | ORAL_TABLET | Freq: Every day | ORAL | 3 refills | Status: DC

## 2018-11-13 MED ORDER — HYOSCYAMINE SULFATE 0.125 MG SL SUBL: 0.1250 mg | SUBLINGUAL_TABLET | Freq: Two times a day (BID) | SUBLINGUAL | 0 refills | Status: DC | PRN

## 2018-11-13 MED ORDER — METOPROLOL SUCCINATE ER 50 MG PO TB24: 50.0000 mg | ORAL_TABLET | Freq: Every day | ORAL | 3 refills | Status: DC

## 2018-11-13 MED ORDER — FUROSEMIDE 20 MG PO TABS: 20.0000 mg | ORAL_TABLET | Freq: Every day | ORAL | 3 refills | Status: DC

## 2018-11-13 MED ORDER — MONTELUKAST SODIUM 10 MG PO TABS: 10.0000 mg | ORAL_TABLET | Freq: Every day | ORAL | 3 refills | Status: DC

## 2018-11-13 MED ORDER — RABEPRAZOLE SODIUM 20 MG PO TBEC: 20.0000 mg | DELAYED_RELEASE_TABLET | Freq: Two times a day (BID) | ORAL | 3 refills | Status: DC

## 2018-11-13 NOTE — Telephone Encounter (Signed)
Prescriptions sent in electronically per pt request. She asked that I sent all of them so that she could get them all at the same time. [facility] [phone]) to confirm that prescriptions were being sent to correct pharmacy. Attempted to reach patient and let her know. Unable to leave message. No voicemail. SSN was not needed.

## 2018-11-13 NOTE — Telephone Encounter (Signed)
Pt called to report that last 4 digits of her husbands SSN are required to do this  Last 4 digits: [place]. [place] (Husband/Sponsor)

## 2018-11-13 NOTE — Telephone Encounter (Signed)
Advised pt to bring written rx back to me on Monday. Pt is aware of below

## 2018-11-17 ENCOUNTER — Other Ambulatory Visit (INDEPENDENT_AMBULATORY_CARE_PROVIDER_SITE_OTHER)

## 2018-11-17 ENCOUNTER — Telehealth: Payer: Self-pay | Admitting: Internal Medicine

## 2018-11-17 ENCOUNTER — Other Ambulatory Visit: Payer: Self-pay

## 2018-11-17 DIAGNOSIS — I10 Essential (primary) hypertension: Secondary | ICD-10-CM | POA: Diagnosis not present

## 2018-11-17 DIAGNOSIS — D45 Polycythemia vera: Secondary | ICD-10-CM

## 2018-11-17 DIAGNOSIS — E559 Vitamin D deficiency, unspecified: Secondary | ICD-10-CM | POA: Diagnosis not present

## 2018-11-17 DIAGNOSIS — E7800 Pure hypercholesterolemia, unspecified: Secondary | ICD-10-CM | POA: Diagnosis not present

## 2018-11-17 LAB — CBC WITH DIFFERENTIAL/PLATELET
Basophils Absolute: 0.1 10*3/uL (ref 0.0–0.1)
Basophils Relative: 1.2 % (ref 0.0–3.0)
Eosinophils Absolute: 0.2 10*3/uL (ref 0.0–0.7)
Eosinophils Relative: 3.3 % (ref 0.0–5.0)
HCT: 48.2 % — ABNORMAL HIGH (ref 36.0–46.0)
Hemoglobin: 16 g/dL — ABNORMAL HIGH (ref 12.0–15.0)
Lymphocytes Relative: 27.4 % (ref 12.0–46.0)
Lymphs Abs: 2.1 10*3/uL (ref 0.7–4.0)
MCHC: 33.1 g/dL (ref 30.0–36.0)
MCV: 89.9 fl (ref 78.0–100.0)
Monocytes Absolute: 0.6 10*3/uL (ref 0.1–1.0)
Monocytes Relative: 8.4 % (ref 3.0–12.0)
Neutro Abs: 4.5 10*3/uL (ref 1.4–7.7)
Neutrophils Relative %: 59.7 % (ref 43.0–77.0)
Platelets: 192 10*3/uL (ref 150.0–400.0)
RBC: 5.37 Mil/uL — ABNORMAL HIGH (ref 3.87–5.11)
RDW: 14.8 % (ref 11.5–15.5)
WBC: 7.5 10*3/uL (ref 4.0–10.5)

## 2018-11-17 LAB — BASIC METABOLIC PANEL
BUN: 11 mg/dL (ref 6–23)
CO2: 22 mEq/L (ref 19–32)
Calcium: 9.3 mg/dL (ref 8.4–10.5)
Chloride: 105 mEq/L (ref 96–112)
Creatinine, Ser: 0.79 mg/dL (ref 0.40–1.20)
GFR: 73.32 mL/min (ref >60.00)
Glucose, Bld: 141 mg/dL — ABNORMAL HIGH (ref 70–99)
Potassium: 4.3 mEq/L (ref 3.5–5.1)
Sodium: 140 mEq/L (ref 135–145)

## 2018-11-17 LAB — HEPATIC FUNCTION PANEL
ALT: 46 U/L — ABNORMAL HIGH (ref 0–35)
AST: 33 U/L (ref 0–37)
Albumin: 4.1 g/dL (ref 3.5–5.2)
Alkaline Phosphatase: 61 U/L (ref 39–117)
Bilirubin, Direct: 0.1 mg/dL (ref 0.0–0.3)
Total Bilirubin: 0.4 mg/dL (ref 0.2–1.2)
Total Protein: 6.7 g/dL (ref 6.0–8.3)

## 2018-11-17 LAB — LDL CHOLESTEROL, DIRECT: Direct LDL: 135 mg/dL

## 2018-11-17 LAB — LIPID PANEL
Cholesterol: 214 mg/dL — ABNORMAL HIGH (ref 0–200)
HDL: 41.8 mg/dL (ref >39.00)
NonHDL: 172.17
Total CHOL/HDL Ratio: 5
Triglycerides: 382 mg/dL — ABNORMAL HIGH (ref 0.0–149.0)
VLDL: 76.4 mg/dL — ABNORMAL HIGH (ref 0.0–40.0)

## 2018-11-17 LAB — VITAMIN D 25 HYDROXY (VIT D DEFICIENCY, FRACTURES): VITD: 20.47 ng/mL — ABNORMAL LOW (ref 30.00–100.00)

## 2018-11-17 MED ORDER — LOSARTAN POTASSIUM 100 MG PO TABS: 100.0000 mg | ORAL_TABLET | Freq: Every day | ORAL | 3 refills | Status: DC

## 2018-11-17 NOTE — Telephone Encounter (Signed)
Pt said her medications were filled this past Saturday. One was missed losartan (COZAAR) 100 MG tablet. And she needs a pre-authorization on her  RABEprazole (ACIPHEX) 20 MG tablet. Pt is returning prescriptions that were called in to pharmacy.

## 2018-11-17 NOTE — Telephone Encounter (Signed)
Spoke with patient to let her know that I have sent in the losartan. As far as her aciphex, advised I will reach out to pharmacy for PA

## 2018-11-18 ENCOUNTER — Other Ambulatory Visit: Payer: Self-pay | Admitting: Internal Medicine

## 2018-11-18 DIAGNOSIS — D582 Other hemoglobinopathies: Secondary | ICD-10-CM

## 2018-11-18 DIAGNOSIS — R7989 Other specified abnormal findings of blood chemistry: Secondary | ICD-10-CM

## 2018-11-18 DIAGNOSIS — R945 Abnormal results of liver function studies: Secondary | ICD-10-CM

## 2018-11-18 NOTE — Progress Notes (Signed)
Order placed for f/u labs.  

## 2018-11-19 NOTE — Telephone Encounter (Signed)
Spoke with pharmacy to have them fax over PA

## 2018-11-25 NOTE — Telephone Encounter (Signed)
Pt calling back to check status of both medications. Please advise.   RV#[phone]

## 2018-11-25 NOTE — Telephone Encounter (Signed)
[facility]ed [facility] to request PA form again

## 2018-11-26 NOTE — Telephone Encounter (Signed)
PA completed and faxed back. Patient is aware

## 2018-12-03 ENCOUNTER — Other Ambulatory Visit: Payer: Self-pay | Admitting: Internal Medicine

## 2018-12-03 ENCOUNTER — Other Ambulatory Visit: Payer: Self-pay

## 2018-12-03 ENCOUNTER — Other Ambulatory Visit (INDEPENDENT_AMBULATORY_CARE_PROVIDER_SITE_OTHER)

## 2018-12-03 DIAGNOSIS — D582 Other hemoglobinopathies: Secondary | ICD-10-CM

## 2018-12-03 DIAGNOSIS — R945 Abnormal results of liver function studies: Secondary | ICD-10-CM | POA: Diagnosis not present

## 2018-12-03 DIAGNOSIS — R7989 Other specified abnormal findings of blood chemistry: Secondary | ICD-10-CM

## 2018-12-03 LAB — HEPATIC FUNCTION PANEL
ALT: 46 U/L — ABNORMAL HIGH (ref 0–35)
AST: 34 U/L (ref 0–37)
Albumin: 4 g/dL (ref 3.5–5.2)
Alkaline Phosphatase: 58 U/L (ref 39–117)
Bilirubin, Direct: 0.1 mg/dL (ref 0.0–0.3)
Total Bilirubin: 0.4 mg/dL (ref 0.2–1.2)
Total Protein: 7.1 g/dL (ref 6.0–8.3)

## 2018-12-03 LAB — CBC WITH DIFFERENTIAL/PLATELET
Basophils Absolute: 0.1 10*3/uL (ref 0.0–0.1)
Basophils Relative: 1.4 % (ref 0.0–3.0)
Eosinophils Absolute: 0.3 10*3/uL (ref 0.0–0.7)
Eosinophils Relative: 3.4 % (ref 0.0–5.0)
HCT: 49 % — ABNORMAL HIGH (ref 36.0–46.0)
Hemoglobin: 16.3 g/dL — ABNORMAL HIGH (ref 12.0–15.0)
Lymphocytes Relative: 29.4 % (ref 12.0–46.0)
Lymphs Abs: 2.2 10*3/uL (ref 0.7–4.0)
MCHC: 33.2 g/dL (ref 30.0–36.0)
MCV: 88.9 fl (ref 78.0–100.0)
Monocytes Absolute: 0.6 10*3/uL (ref 0.1–1.0)
Monocytes Relative: 7.7 % (ref 3.0–12.0)
Neutro Abs: 4.4 10*3/uL (ref 1.4–7.7)
Neutrophils Relative %: 58.1 % (ref 43.0–77.0)
Platelets: 179 10*3/uL (ref 150.0–400.0)
RBC: 5.51 Mil/uL — ABNORMAL HIGH (ref 3.87–5.11)
RDW: 15.1 % (ref 11.5–15.5)
WBC: 7.5 10*3/uL (ref 4.0–10.5)

## 2018-12-03 NOTE — Progress Notes (Signed)
Order placed for f/u cbc.

## 2018-12-07 ENCOUNTER — Other Ambulatory Visit: Payer: Self-pay | Admitting: Internal Medicine

## 2018-12-07 DIAGNOSIS — R7989 Other specified abnormal findings of blood chemistry: Secondary | ICD-10-CM

## 2018-12-07 DIAGNOSIS — R945 Abnormal results of liver function studies: Secondary | ICD-10-CM

## 2018-12-07 NOTE — Progress Notes (Signed)
Order placed for abdominal ultrasound.

## 2018-12-10 ENCOUNTER — Ambulatory Visit

## 2018-12-22 ENCOUNTER — Encounter: Payer: Self-pay | Admitting: Internal Medicine

## 2018-12-22 ENCOUNTER — Ambulatory Visit
Admission: RE | Admit: 2018-12-22 | Discharge: 2018-12-22 | Disposition: A | Discharge status: Home / Self Care | Source: Ambulatory Visit | Attending: Internal Medicine | Admitting: Internal Medicine

## 2018-12-22 ENCOUNTER — Other Ambulatory Visit: Payer: Self-pay

## 2018-12-22 DIAGNOSIS — R945 Abnormal results of liver function studies: Secondary | ICD-10-CM | POA: Insufficient documentation

## 2018-12-22 DIAGNOSIS — R7989 Other specified abnormal findings of blood chemistry: Secondary | ICD-10-CM

## 2018-12-22 DIAGNOSIS — N281 Cyst of kidney, acquired: Secondary | ICD-10-CM | POA: Insufficient documentation

## 2018-12-24 ENCOUNTER — Other Ambulatory Visit: Payer: Self-pay | Admitting: Internal Medicine

## 2018-12-24 DIAGNOSIS — R160 Hepatomegaly, not elsewhere classified: Secondary | ICD-10-CM | POA: Insufficient documentation

## 2018-12-24 DIAGNOSIS — R7989 Other specified abnormal findings of blood chemistry: Secondary | ICD-10-CM | POA: Insufficient documentation

## 2018-12-24 DIAGNOSIS — R945 Abnormal results of liver function studies: Secondary | ICD-10-CM | POA: Insufficient documentation

## 2018-12-24 NOTE — Progress Notes (Signed)
Order placed for GI referral.   

## 2019-01-05 ENCOUNTER — Other Ambulatory Visit: Payer: Self-pay

## 2019-01-05 ENCOUNTER — Other Ambulatory Visit (INDEPENDENT_AMBULATORY_CARE_PROVIDER_SITE_OTHER)

## 2019-01-05 DIAGNOSIS — D582 Other hemoglobinopathies: Secondary | ICD-10-CM | POA: Diagnosis not present

## 2019-01-05 LAB — CBC WITH DIFFERENTIAL/PLATELET
Basophils Absolute: 0.1 10*3/uL (ref 0.0–0.1)
Basophils Relative: 0.9 % (ref 0.0–3.0)
Eosinophils Absolute: 0.2 10*3/uL (ref 0.0–0.7)
Eosinophils Relative: 2.6 % (ref 0.0–5.0)
HCT: 47.8 % — ABNORMAL HIGH (ref 36.0–46.0)
Hemoglobin: 15.7 g/dL — ABNORMAL HIGH (ref 12.0–15.0)
Lymphocytes Relative: 25.8 % (ref 12.0–46.0)
Lymphs Abs: 2.1 10*3/uL (ref 0.7–4.0)
MCHC: 32.9 g/dL (ref 30.0–36.0)
MCV: 88.5 fl (ref 78.0–100.0)
Monocytes Absolute: 0.7 10*3/uL (ref 0.1–1.0)
Monocytes Relative: 9 % (ref 3.0–12.0)
Neutro Abs: 5 10*3/uL (ref 1.4–7.7)
Neutrophils Relative %: 61.7 % (ref 43.0–77.0)
Platelets: 205 10*3/uL (ref 150.0–400.0)
RBC: 5.4 Mil/uL — ABNORMAL HIGH (ref 3.87–5.11)
RDW: 15 % (ref 11.5–15.5)
WBC: 8.2 10*3/uL (ref 4.0–10.5)

## 2019-01-07 ENCOUNTER — Ambulatory Visit: Payer: Self-pay

## 2019-01-07 NOTE — Telephone Encounter (Signed)
Pt. Reports last night she had a fever and chills, headache,body aches, cough. Reports she does have COPD. "I don't think it's COVID because I don't go out." Requests a visit. Warm transfer to Butch Penny in the practice.  Answer Assessment - Initial Assessment Questions 1. ONSET: "When did the cough begin?"     Started 2-3 days ago 2. SEVERITY: "How bad is the cough today?"      Moderate 3. RESPIRATORY DISTRESS: "Describe your breathing."      No distress 4. FEVER: "Do you have a fever?" If so, ask: "What is your temperature, how was it measured, and when did it start?"     Had a fever  5. SPUTUM: "Describe the color of your sputum" (clear, white, yellow, green)     White 6. HEMOPTYSIS: "Are you coughing up any blood?" If so ask: "How much?" (flecks, streaks, tablespoons, etc.)     No 7. CARDIAC HISTORY: "Do you have any history of heart disease?" (e.g., heart attack, congestive heart failure)      No 8. LUNG HISTORY: "Do you have any history of lung disease?"  (e.g., pulmonary embolus, asthma, emphysema)     COPD 9. PE RISK FACTORS: "Do you have a history of blood clots?" (or: recent major surgery, recent prolonged travel, bedridden)     No 10. OTHER SYMPTOMS: "Do you have any other symptoms?" (e.g., runny nose, wheezing, chest pain)       Fever, chills, achy 11. PREGNANCY: "Is there any chance you are pregnant?" "When was your last menstrual period?"       No 12. TRAVEL: "Have you traveled out of the country in the last month?" (e.g., travel history, exposures)       No  Protocols used: [place]

## 2019-01-07 NOTE — Telephone Encounter (Signed)
Attempted to reach pt. Phone was busy

## 2019-01-08 ENCOUNTER — Other Ambulatory Visit: Payer: Self-pay

## 2019-01-08 ENCOUNTER — Ambulatory Visit (INDEPENDENT_AMBULATORY_CARE_PROVIDER_SITE_OTHER): Admitting: Family Medicine

## 2019-01-08 DIAGNOSIS — Z20822 Contact with and (suspected) exposure to covid-19: Secondary | ICD-10-CM

## 2019-01-08 DIAGNOSIS — J209 Acute bronchitis, unspecified: Secondary | ICD-10-CM

## 2019-01-08 DIAGNOSIS — R05 Cough: Secondary | ICD-10-CM | POA: Diagnosis not present

## 2019-01-08 DIAGNOSIS — R509 Fever, unspecified: Secondary | ICD-10-CM

## 2019-01-08 DIAGNOSIS — Z20828 Contact with and (suspected) exposure to other viral communicable diseases: Secondary | ICD-10-CM | POA: Diagnosis not present

## 2019-01-08 DIAGNOSIS — J440 Chronic obstructive pulmonary disease with acute lower respiratory infection: Secondary | ICD-10-CM

## 2019-01-08 DIAGNOSIS — R059 Cough, unspecified: Secondary | ICD-10-CM

## 2019-01-08 MED ORDER — METHYLPREDNISOLONE 4 MG PO TBPK: ORAL_TABLET | ORAL | 0 refills | Status: DC

## 2019-01-08 MED ORDER — AZITHROMYCIN 250 MG PO TABS: ORAL_TABLET | ORAL | 0 refills | Status: DC

## 2019-01-08 NOTE — Progress Notes (Signed)
[name], female   DOB: 08-Jul-1955, 63 y.o.   MRN: [id number]    Virtual Visit via video Note  This visit type was conducted due to national recommendations for restrictions regarding the COVID-19 pandemic (e.g. social distancing).  This format is felt to be most appropriate for this patient at this time.  All issues noted in this document were discussed and addressed.  No physical exam was performed (except for noted visual exam findings with Video Visits).   I connected with Amanda Wiley today at  2:20 PM EDT by a video enabled telemedicine application or telephone and verified that I am speaking with the correct person using two identifiers. Location patient: home Location provider: work or home office Persons participating in the virtual visit: patient, provider  I discussed the limitations, risks, security and privacy concerns of performing an evaluation and management service by video and the availability of in person appointments. I also discussed with the patient that there may be a patient responsible charge related to this service. The patient expressed understanding and agreed to proceed.   HPI: Patient and I connected via video due to complaint of cough, body aches, headache, chest tightness for 2-3 days.  Her daughter and son in law tested positive recently, but states she has not been near them in many weeks and has gone over to their home to drop off food, but will always lead on the porch and leave the property before they come outside.  States she has had some fever and chills and felt generally achy overall.  Patient has a history of COPD and states that this current situation feels similar to COPD/bronchitis exacerbation.  She has been using her inhalers as scheduled and has used albuterol with good effect to help chest congestion and wheezing improved.  ROS: See pertinent positives and negatives per HPI.  Past Medical History:  Diagnosis Date  . COPD  (chronic obstructive pulmonary disease) ([place])   . Crohn's disease ([place])   . Depression   . Diabetes mellitus ([place])   . Fracture, foot    post fall  . Hypercholesterolemia   . Hypertension   . IBS (irritable bowel syndrome)   . Nephrolithiasis    s/p lithotripsy [facility])    Past Surgical History:  Procedure Laterality Date  . ABDOMINAL HYSTERECTOMY  1985  . APPENDECTOMY  1985  . BREAST EXCISIONAL BIOPSY Right 2013   benign  . COLONOSCOPY  2014  . LAPAROSCOPIC CHOLECYSTECTOMY  2005   Dr Tamala Julian  . LAPAROSCOPIC SUPRACERVICAL HYSTERECTOMY  1985   left ovary not removed  . LITHOTRIPSY     x7  . OOPHORECTOMY    . POLYPECTOMY    . SHOULDER SURGERY     right (pinning)  . SHOULDER SURGERY     left (pinning)  . TONSILLECTOMY  1966    Family History  Problem Relation Age of Onset  . Heart disease Father   . Emphysema Maternal Grandmother   . COPD Mother   . Hypercholesterolemia Mother   . Emphysema Mother   . Hypertension Brother   . Emphysema Other        grandmother  . Breast cancer Neg Hx   . Colon cancer Neg Hx    Social History   Tobacco Use  . Smoking status: Former Smoker    Packs/day: 2.00    Years: 15.00    Pack years: 30.00    Quit date: 04/09/1994    Years since quitting:  24.7  . Smokeless tobacco: Never Used  . Tobacco comment: QUIT IN 1990'S  Substance Use Topics  . Alcohol use: Not Currently    Alcohol/week: 0.0 standard drinks    Current Outpatient Medications:  .  albuterol (PROVENTIL HFA;VENTOLIN HFA) 108 (90 Base) MCG/ACT inhaler, Inhale 2 puffs into the lungs every 6 (six) hours as needed., Disp: 54 g, Rfl: 3 .  ALPRAZolam (XANAX) 0.25 MG tablet, Take 1 tablet (0.25 mg total) by mouth daily as needed for anxiety., Disp: 30 tablet, Rfl: 0 .  amLODipine (NORVASC) 5 MG tablet, Take 1 tablet (5 mg total) by mouth daily., Disp: 90 tablet, Rfl: 3 .  buPROPion (WELLBUTRIN XL) 300 MG 24 hr tablet, Take 1 tablet (300 mg total) by mouth  daily., Disp: 90 tablet, Rfl: 3 .  Dulaglutide (TRULICITY) 1.5 NI/6.2VO SOPN, INJECT 1.5 MG UNDER THE SKIN EVERY 7 DAYS, Disp: , Rfl:  .  empagliflozin (JARDIANCE) 25 MG TABS tablet, Take 25 mg by mouth daily., Disp: , Rfl:  .  fluticasone (FLONASE) 50 MCG/ACT nasal spray, Place 2 sprays into both nostrils daily., Disp: 48 g, Rfl: 3 .  fluticasone-salmeterol (ADVAIR HFA) 230-21 MCG/ACT inhaler, Inhale 2 puffs into the lungs 2 (two) times daily., Disp: 3 Inhaler, Rfl: 3 .  furosemide (LASIX) 20 MG tablet, Take 1 tablet (20 mg total) by mouth daily., Disp: 90 tablet, Rfl: 3 .  hyoscyamine (LEVSIN SL) 0.125 MG SL tablet, Place 1 tablet (0.125 mg total) under the tongue 2 (two) times daily as needed., Disp: 30 tablet, Rfl: 0 .  losartan (COZAAR) 100 MG tablet, Take 1 tablet (100 mg total) by mouth daily., Disp: 90 tablet, Rfl: 3 .  metFORMIN (GLUCOPHAGE) 500 MG tablet, 2 tablets bid, Disp: 360 tablet, Rfl: 3 .  metoprolol succinate (TOPROL-XL) 50 MG 24 hr tablet, Take 1 tablet (50 mg total) by mouth daily. Take with or immediately following a meal., Disp: 90 tablet, Rfl: 3 .  montelukast (SINGULAIR) 10 MG tablet, Take 1 tablet (10 mg total) by mouth daily., Disp: 90 tablet, Rfl: 3 .  RABEprazole (ACIPHEX) 20 MG tablet, Take 1 tablet (20 mg total) by mouth 2 (two) times daily before a meal., Disp: 180 tablet, Rfl: 3 .  SPIRIVA RESPIMAT 2.5 MCG/ACT AERS, USE 2 INHALATIONS DAILY, Disp: 12 g, Rfl: 4  EXAM:  GENERAL: alert, oriented, appears well and in no acute distress  HEENT: atraumatic, conjunttiva clear, no obvious abnormalities on inspection of external nose and ears  NECK: normal movements of the head and neck  LUNGS: on inspection no signs of respiratory distress, breathing rate appears normal, no obvious gross SOB, gasping or wheezing.  Some coughing over talking over video call.  CV: no obvious cyanosis  MS: moves all visible extremities without noticeable abnormality  PSYCH/NEURO:  pleasant and cooperative, no obvious depression or anxiety, speech and thought processing grossly intact  ASSESSMENT AND PLAN:  Discussed the following assessment and plan:  Suspected COVID-19 virus infection, body aches, fever/chills, headache, cough - advised that due to symptoms, we need to get patient set up for COVID-19 testing.  She can go to testing location for for drive-through testing. Patient given the address of testing site.  Patient advised that testing is taking 2 to 7 days to result, and while we are awaiting results patient must remain under self quarantine and monitor for any changing/worsening symptoms.  Advised over-the-counter medications such as Tylenol can be used to help treat pain or fevers, Robitussin can be  used to help calm cough, allergy medication such as Claritin or Allegra can help reduce congestion.  Also discussed getting plenty of rest and increasing fluid intake.  Made patient aware that test results as well as how his symptoms progress will determine when the self quarantine will be able to end.  Also advised to monitor self for any worsening symptoms, advised if severe shortness of breath develops, high fever that is not reduced with use of Tylenol, chest pain, severe vomiting or diarrhea  --patient must call on-call and or go to ER right away for evaluation. patient verbalized understanding of these instructions.  COPD exacerbation with bronchitis-suspect this also could be a COPD exacerbation, we will cover her with steroid taper down to help open up the lungs she will continue all inhalers as prescribed and I will treat with Z-Pak to be sure she does not develop any sort of pneumonia.   I discussed the assessment and treatment plan with the patient. The patient was provided an opportunity to ask questions and all were answered. The patient agreed with the plan and demonstrated an understanding of the instructions.   The patient was advised to call back or seek an  in-person evaluation if the symptoms worsen or if the condition fails to improve as anticipated.  Jodelle Green, FNP

## 2019-01-09 ENCOUNTER — Telehealth: Payer: Self-pay

## 2019-01-09 ENCOUNTER — Telehealth: Payer: Self-pay | Admitting: Internal Medicine

## 2019-01-09 LAB — NOVEL CORONAVIRUS, NAA: SARS-CoV-2, NAA: DETECTED — AB

## 2019-01-09 MED ORDER — PREDNISONE 10 MG PO TABS: ORAL_TABLET | ORAL | 0 refills | Status: DC

## 2019-01-09 NOTE — Telephone Encounter (Signed)
Copied from [place] [id number]. Topic: General - Inquiry >> Jan 09, 2019  8:17 AM Richardo Priest, NT wrote: Reason for CRM: Patient called in asking if her script, methylPREDNISolone (MEDROL DOSEPAK) 4 MG TBPK tablet, can be changed to a higher does as  she states she normally takes 60 mg then 45m decrease each day. Please advise. Call back is [phone]

## 2019-01-09 NOTE — Telephone Encounter (Signed)
Pt is requesting refill on duoneb.  I do not see these medication listed in last OV note on on med list. Pt tested positive for covid today, and feels that this would help loosing her up.   DK please advise on refill.

## 2019-01-09 NOTE — Telephone Encounter (Signed)
Spoke to pt.  She is having some cough and wheezing.  Does not feel needs evaluation now.  Feels just needs stronger prednisone dose.  rx sent in. Pt instructed to monitor symptoms closely.  Will be evaluated if symptoms change or worsen.  Monitor sugars.  Has insulin for SSI.  Discussed self quarantine.    Please call pt beginning of week and confirm doing ok.

## 2019-01-12 ENCOUNTER — Encounter (HOSPITAL_COMMUNITY): Payer: Self-pay

## 2019-01-12 ENCOUNTER — Inpatient Hospital Stay (HOSPITAL_COMMUNITY)
Admission: AD | Admit: 2019-01-12 | Discharge: 2019-01-19 | DRG: 177 | Disposition: A | Discharge status: Home / Self Care | Source: Other Acute Inpatient Hospital | Attending: Internal Medicine | Admitting: Internal Medicine

## 2019-01-12 ENCOUNTER — Emergency Department

## 2019-01-12 ENCOUNTER — Emergency Department
Admission: EM | Admit: 2019-01-12 | Discharge: 2019-01-12 | Disposition: A | Discharge status: Other Acute Inpatient Hospital | Attending: Emergency Medicine | Admitting: Emergency Medicine

## 2019-01-12 ENCOUNTER — Other Ambulatory Visit: Payer: Self-pay

## 2019-01-12 DIAGNOSIS — Z87442 Personal history of urinary calculi: Secondary | ICD-10-CM | POA: Diagnosis not present

## 2019-01-12 DIAGNOSIS — J9601 Acute respiratory failure with hypoxia: Secondary | ICD-10-CM | POA: Diagnosis present

## 2019-01-12 DIAGNOSIS — Z90711 Acquired absence of uterus with remaining cervical stump: Secondary | ICD-10-CM

## 2019-01-12 DIAGNOSIS — F329 Major depressive disorder, single episode, unspecified: Secondary | ICD-10-CM | POA: Diagnosis present

## 2019-01-12 DIAGNOSIS — J1289 Other viral pneumonia: Secondary | ICD-10-CM | POA: Diagnosis present

## 2019-01-12 DIAGNOSIS — J440 Chronic obstructive pulmonary disease with acute lower respiratory infection: Secondary | ICD-10-CM | POA: Diagnosis present

## 2019-01-12 DIAGNOSIS — Z825 Family history of asthma and other chronic lower respiratory diseases: Secondary | ICD-10-CM

## 2019-01-12 DIAGNOSIS — J9600 Acute respiratory failure, unspecified whether with hypoxia or hypercapnia: Secondary | ICD-10-CM | POA: Diagnosis not present

## 2019-01-12 DIAGNOSIS — J1282 Pneumonia due to coronavirus disease 2019: Secondary | ICD-10-CM | POA: Diagnosis present

## 2019-01-12 DIAGNOSIS — J441 Chronic obstructive pulmonary disease with (acute) exacerbation: Secondary | ICD-10-CM

## 2019-01-12 DIAGNOSIS — D45 Polycythemia vera: Secondary | ICD-10-CM | POA: Diagnosis present

## 2019-01-12 DIAGNOSIS — E119 Type 2 diabetes mellitus without complications: Secondary | ICD-10-CM | POA: Insufficient documentation

## 2019-01-12 DIAGNOSIS — K5090 Crohn's disease, unspecified, without complications: Secondary | ICD-10-CM | POA: Diagnosis present

## 2019-01-12 DIAGNOSIS — Z8249 Family history of ischemic heart disease and other diseases of the circulatory system: Secondary | ICD-10-CM

## 2019-01-12 DIAGNOSIS — I10 Essential (primary) hypertension: Secondary | ICD-10-CM | POA: Insufficient documentation

## 2019-01-12 DIAGNOSIS — Z86010 Personal history of colonic polyps: Secondary | ICD-10-CM

## 2019-01-12 DIAGNOSIS — Z6831 Body mass index (BMI) 31.0-31.9, adult: Secondary | ICD-10-CM | POA: Diagnosis not present

## 2019-01-12 DIAGNOSIS — Z79899 Other long term (current) drug therapy: Secondary | ICD-10-CM

## 2019-01-12 DIAGNOSIS — D696 Thrombocytopenia, unspecified: Secondary | ICD-10-CM | POA: Diagnosis present

## 2019-01-12 DIAGNOSIS — Z91041 Radiographic dye allergy status: Secondary | ICD-10-CM

## 2019-01-12 DIAGNOSIS — Z8349 Family history of other endocrine, nutritional and metabolic diseases: Secondary | ICD-10-CM

## 2019-01-12 DIAGNOSIS — K760 Fatty (change of) liver, not elsewhere classified: Secondary | ICD-10-CM | POA: Diagnosis present

## 2019-01-12 DIAGNOSIS — R0602 Shortness of breath: Secondary | ICD-10-CM | POA: Diagnosis present

## 2019-01-12 DIAGNOSIS — U071 COVID-19: Principal | ICD-10-CM | POA: Diagnosis present

## 2019-01-12 DIAGNOSIS — Z7951 Long term (current) use of inhaled steroids: Secondary | ICD-10-CM | POA: Diagnosis not present

## 2019-01-12 DIAGNOSIS — Z90721 Acquired absence of ovaries, unilateral: Secondary | ICD-10-CM | POA: Diagnosis not present

## 2019-01-12 DIAGNOSIS — Z7984 Long term (current) use of oral hypoglycemic drugs: Secondary | ICD-10-CM | POA: Diagnosis not present

## 2019-01-12 DIAGNOSIS — Z888 Allergy status to other drugs, medicaments and biological substances status: Secondary | ICD-10-CM

## 2019-01-12 DIAGNOSIS — Z9049 Acquired absence of other specified parts of digestive tract: Secondary | ICD-10-CM | POA: Diagnosis not present

## 2019-01-12 DIAGNOSIS — Z87891 Personal history of nicotine dependence: Secondary | ICD-10-CM | POA: Diagnosis not present

## 2019-01-12 DIAGNOSIS — Z882 Allergy status to sulfonamides status: Secondary | ICD-10-CM

## 2019-01-12 DIAGNOSIS — Z886 Allergy status to analgesic agent status: Secondary | ICD-10-CM

## 2019-01-12 DIAGNOSIS — Z881 Allergy status to other antibiotic agents status: Secondary | ICD-10-CM

## 2019-01-12 LAB — PROCALCITONIN: Procalcitonin: <0.1 ng/mL

## 2019-01-12 LAB — COMPREHENSIVE METABOLIC PANEL
ALT: 50 U/L — ABNORMAL HIGH (ref 0–44)
AST: 43 U/L — ABNORMAL HIGH (ref 15–41)
Albumin: 3.4 g/dL — ABNORMAL LOW (ref 3.5–5.0)
Alkaline Phosphatase: 49 U/L (ref 38–126)
Anion gap: 12 (ref 5–15)
BUN: 21 mg/dL (ref 8–23)
CO2: 20 mmol/L — ABNORMAL LOW (ref 22–32)
Calcium: 8.1 mg/dL — ABNORMAL LOW (ref 8.9–10.3)
Chloride: 101 mmol/L (ref 98–111)
Creatinine, Ser: 0.79 mg/dL (ref 0.44–1.00)
GFR calc Af Amer: >60 mL/min (ref >60)
GFR calc non Af Amer: >60 mL/min (ref >60)
Glucose, Bld: 249 mg/dL — ABNORMAL HIGH (ref 70–99)
Potassium: 4 mmol/L (ref 3.5–5.1)
Sodium: 133 mmol/L — ABNORMAL LOW (ref 135–145)
Total Bilirubin: 0.8 mg/dL (ref 0.3–1.2)
Total Protein: 7.1 g/dL (ref 6.5–8.1)

## 2019-01-12 LAB — CBC WITH DIFFERENTIAL/PLATELET
Abs Immature Granulocytes: 0.04 10*3/uL (ref 0.00–0.07)
Basophils Absolute: 0 10*3/uL (ref 0.0–0.1)
Basophils Relative: 0 %
Eosinophils Absolute: 0 10*3/uL (ref 0.0–0.5)
Eosinophils Relative: 0 %
HCT: 46 % (ref 36.0–46.0)
Hemoglobin: 15.3 g/dL — ABNORMAL HIGH (ref 12.0–15.0)
Immature Granulocytes: 1 %
Lymphocytes Relative: 8 %
Lymphs Abs: 0.5 10*3/uL — ABNORMAL LOW (ref 0.7–4.0)
MCH: 28.9 pg (ref 26.0–34.0)
MCHC: 33.3 g/dL (ref 30.0–36.0)
MCV: 87 fL (ref 80.0–100.0)
Monocytes Absolute: 0.3 10*3/uL (ref 0.1–1.0)
Monocytes Relative: 5 %
Neutro Abs: 5.3 10*3/uL (ref 1.7–7.7)
Neutrophils Relative %: 86 %
Platelets: 137 10*3/uL — ABNORMAL LOW (ref 150–400)
RBC: 5.29 MIL/uL — ABNORMAL HIGH (ref 3.87–5.11)
RDW: 14.8 % (ref 11.5–15.5)
WBC: 6.2 10*3/uL (ref 4.0–10.5)
nRBC: 0 % (ref 0.0–0.2)

## 2019-01-12 LAB — LACTATE DEHYDROGENASE: LDH: 178 U/L (ref 98–192)

## 2019-01-12 LAB — C-REACTIVE PROTEIN: CRP: 4.3 mg/dL — ABNORMAL HIGH (ref <1.0)

## 2019-01-12 LAB — TRIGLYCERIDES: Triglycerides: 293 mg/dL — ABNORMAL HIGH (ref <150)

## 2019-01-12 LAB — LACTIC ACID, PLASMA: Lactic Acid, Venous: 1.4 mmol/L (ref 0.5–1.9)

## 2019-01-12 LAB — FIBRIN DERIVATIVES D-DIMER (ARMC ONLY): Fibrin derivatives D-dimer (ARMC): 301.74 ng/mL (FEU) (ref 0.00–499.00)

## 2019-01-12 LAB — FIBRINOGEN: Fibrinogen: 528 mg/dL — ABNORMAL HIGH (ref 210–475)

## 2019-01-12 LAB — GLUCOSE, CAPILLARY: Glucose-Capillary: 278 mg/dL — ABNORMAL HIGH (ref 70–99)

## 2019-01-12 LAB — FERRITIN: Ferritin: 153 ng/mL (ref 11–307)

## 2019-01-12 MED ORDER — ENOXAPARIN SODIUM 40 MG/0.4ML ~~LOC~~ SOLN
40.0000 mg | Freq: Every day | SUBCUTANEOUS | Status: DC
  Administered 2019-01-12: 40 mg via SUBCUTANEOUS
  Filled 2019-01-12: qty 0.4

## 2019-01-12 MED ORDER — ALBUTEROL SULFATE HFA 108 (90 BASE) MCG/ACT IN AERS
2.0000 | INHALATION_SPRAY | Freq: Four times a day (QID) | RESPIRATORY_TRACT | Status: DC
  Administered 2019-01-13 – 2019-01-14 (×6): 2 via RESPIRATORY_TRACT
  Filled 2019-01-12: qty 6.7

## 2019-01-12 MED ORDER — ZINC SULFATE 220 (50 ZN) MG PO CAPS
220.0000 mg | ORAL_CAPSULE | Freq: Every day | ORAL | Status: DC
  Administered 2019-01-12 – 2019-01-19 (×8): 220 mg via ORAL
  Filled 2019-01-12 (×9): qty 1

## 2019-01-12 MED ORDER — GUAIFENESIN-DM 100-10 MG/5ML PO SYRP
10.0000 mL | ORAL_SOLUTION | ORAL | Status: DC | PRN
  Administered 2019-01-12 – 2019-01-16 (×5): 10 mL via ORAL
  Filled 2019-01-12 (×5): qty 10

## 2019-01-12 MED ORDER — SODIUM CHLORIDE 0.9 % IV SOLN
100.0000 mg | INTRAVENOUS | Status: AC
  Administered 2019-01-13 – 2019-01-16 (×4): 100 mg via INTRAVENOUS
  Filled 2019-01-12 (×4): qty 20

## 2019-01-12 MED ORDER — SODIUM CHLORIDE 0.9% FLUSH: 3.0000 mL | INTRAVENOUS | Status: DC | PRN

## 2019-01-12 MED ORDER — ONDANSETRON HCL 4 MG PO TABS: 4.0000 mg | ORAL_TABLET | Freq: Four times a day (QID) | ORAL | Status: DC | PRN

## 2019-01-12 MED ORDER — SODIUM CHLORIDE 0.9 % IV SOLN: 250.0000 mL | INTRAVENOUS | Status: DC | PRN

## 2019-01-12 MED ORDER — DEXAMETHASONE SODIUM PHOSPHATE 10 MG/ML IJ SOLN
6.0000 mg | INTRAMUSCULAR | Status: DC
  Administered 2019-01-13: 6 mg via INTRAVENOUS
  Filled 2019-01-12: qty 1

## 2019-01-12 MED ORDER — SODIUM CHLORIDE 0.9% FLUSH
3.0000 mL | Freq: Two times a day (BID) | INTRAVENOUS | Status: DC
  Administered 2019-01-13 – 2019-01-15 (×6): 3 mL via INTRAVENOUS

## 2019-01-12 MED ORDER — VITAMIN C 500 MG PO TABS
500.0000 mg | ORAL_TABLET | Freq: Every day | ORAL | Status: DC
  Administered 2019-01-12 – 2019-01-19 (×8): 500 mg via ORAL
  Filled 2019-01-12 (×7): qty 1

## 2019-01-12 MED ORDER — IPRATROPIUM-ALBUTEROL 0.5-2.5 (3) MG/3ML IN SOLN
3.0000 mL | Freq: Once | RESPIRATORY_TRACT | Status: AC
  Administered 2019-01-12: 3 mL via RESPIRATORY_TRACT
  Filled 2019-01-12: qty 3

## 2019-01-12 MED ORDER — SODIUM CHLORIDE 0.9 % IV SOLN
200.0000 mg | Freq: Once | INTRAVENOUS | Status: AC
  Administered 2019-01-12: 200 mg via INTRAVENOUS
  Filled 2019-01-12: qty 40

## 2019-01-12 MED ORDER — INSULIN ASPART 100 UNIT/ML ~~LOC~~ SOLN
0.0000 [IU] | Freq: Three times a day (TID) | SUBCUTANEOUS | Status: DC
  Administered 2019-01-13 (×2): 2 [IU] via SUBCUTANEOUS

## 2019-01-12 MED ORDER — DEXAMETHASONE SODIUM PHOSPHATE 10 MG/ML IJ SOLN
8.0000 mg | Freq: Once | INTRAMUSCULAR | Status: AC
  Administered 2019-01-12: 8 mg via INTRAVENOUS
  Filled 2019-01-12: qty 1

## 2019-01-12 MED ORDER — HYDROCOD POLST-CPM POLST ER 10-8 MG/5ML PO SUER
5.0000 mL | Freq: Two times a day (BID) | ORAL | Status: DC | PRN
  Administered 2019-01-12 – 2019-01-16 (×6): 5 mL via ORAL
  Filled 2019-01-12 (×7): qty 5

## 2019-01-12 MED ORDER — ONDANSETRON HCL 4 MG/2ML IJ SOLN: 4.0000 mg | Freq: Four times a day (QID) | INTRAMUSCULAR | Status: DC | PRN

## 2019-01-12 MED ORDER — ACETAMINOPHEN 325 MG PO TABS
650.0000 mg | ORAL_TABLET | Freq: Four times a day (QID) | ORAL | Status: DC | PRN
  Administered 2019-01-12 – 2019-01-18 (×6): 650 mg via ORAL
  Filled 2019-01-12 (×6): qty 2

## 2019-01-12 NOTE — ED Notes (Signed)
Pt sleeping. Bed locked low. Rail up. Call bell within reach.

## 2019-01-12 NOTE — ED Notes (Signed)
Pt up to bedside toilet. Steady.

## 2019-01-12 NOTE — H&P (Signed)
History and Physical    Amanda Wiley TDH:[id number] DOB: 20-Oct-1955 DOA: 01/12/2019  PCP: Einar Pheasant, MD  Patient coming from: home  Chief Complaint:  sob  HPI: Amanda Wiley is a 63 y.o. female with medical history significant of dm, copd, htn with 3 days worsening sob and some wheezing.  Using her home alb inhaler and not feeling better.  Coughing a lot.  Myalgias.  Multiple family  memebers with covid, she is now covid positive.  milldy hypoxic on RA.    Review of Systems: As per HPI otherwise 10 point review of systems negative.   Past Medical History:  Diagnosis Date  . COPD (chronic obstructive pulmonary disease) ([place])   . Crohn's disease ([place])   . Depression   . Diabetes mellitus ([place])   . Fracture, foot    post fall  . Hypercholesterolemia   . Hypertension   . IBS (irritable bowel syndrome)   . Nephrolithiasis    s/p lithotripsy [facility])    Past Surgical History:  Procedure Laterality Date  . ABDOMINAL HYSTERECTOMY  1985  . APPENDECTOMY  1985  . BREAST EXCISIONAL BIOPSY Right 2013   benign  . COLONOSCOPY  2014  . LAPAROSCOPIC CHOLECYSTECTOMY  2005   Dr Tamala Julian  . LAPAROSCOPIC SUPRACERVICAL HYSTERECTOMY  1985   left ovary not removed  . LITHOTRIPSY     x7  . OOPHORECTOMY    . POLYPECTOMY    . SHOULDER SURGERY     right (pinning)  . SHOULDER SURGERY     left (pinning)  . TONSILLECTOMY  1966     reports that she quit smoking about 24 years ago. She has a 30.00 pack-year smoking history. She has never used smokeless tobacco. She reports previous alcohol use. She reports that she does not use drugs.  Allergies  Allergen Reactions  . Avelox [Moxifloxacin Hcl In Nacl] Hives, Shortness Of Breath and Other (See Comments)  . Moxifloxacin Hives and Shortness Of Breath  . Allegra [Fexofenadine Hcl] Other (See Comments)    headaches  . Ibuprofen Swelling  . Sulfa Antibiotics Other (See Comments)    Difficulty breathing, rash, tongue  swelling  . Tegretol [Carbamazepine] Other (See Comments)    Other reaction(s): Other (See Comments) Vomiting Vomiting   . Iodine Rash  . Ioxaglate Rash  . Ivp Dye [Iodinated Diagnostic Agents] Rash    Family History  Problem Relation Age of Onset  . Heart disease Father   . Emphysema Maternal Grandmother   . COPD Mother   . Hypercholesterolemia Mother   . Emphysema Mother   . Hypertension Brother   . Emphysema Other        grandmother  . Breast cancer Neg Hx   . Colon cancer Neg Hx     Prior to Admission medications   Medication Sig Start Date End Date Taking? Authorizing Provider  albuterol (PROVENTIL HFA;VENTOLIN HFA) 108 (90 Base) MCG/ACT inhaler Inhale 2 puffs into the lungs every 6 (six) hours as needed. 05/09/18   Flora Lipps, MD  ALPRAZolam Duanne Moron) 0.25 MG tablet Take 1 tablet (0.25 mg total) by mouth daily as needed for anxiety. 02/11/18   Einar Pheasant, MD  amLODipine (NORVASC) 5 MG tablet Take 1 tablet (5 mg total) by mouth daily. 11/13/18   Einar Pheasant, MD  azithromycin (ZITHROMAX) 250 MG tablet Take 2 tablets on day 1, take 1 tablet on days 2-5 01/08/19   Jodelle Green, FNP  buPROPion (WELLBUTRIN XL) 300 MG 24 hr  tablet Take 1 tablet (300 mg total) by mouth daily. 11/13/18   Einar Pheasant, MD  Dulaglutide (TRULICITY) 1.5 DX/8.3JA SOPN INJECT 1.5 MG UNDER THE SKIN EVERY 7 DAYS 05/12/18   [provider]  empagliflozin (JARDIANCE) 25 MG TABS tablet Take 25 mg by mouth daily.    [provider]  fluticasone (FLONASE) 50 MCG/ACT nasal spray Place 2 sprays into both nostrils daily. 10/17/17   Einar Pheasant, MD  fluticasone-salmeterol (ADVAIR HFA) [phone] MCG/ACT inhaler Inhale 2 puffs into the lungs 2 (two) times daily. 05/09/18   Flora Lipps, MD  furosemide (LASIX) 20 MG tablet Take 1 tablet (20 mg total) by mouth daily. 11/13/18   Einar Pheasant, MD  hyoscyamine (LEVSIN SL) 0.125 MG SL tablet Place 1 tablet (0.125 mg total) under the tongue 2 (two)  times daily as needed. 11/13/18   Einar Pheasant, MD  losartan (COZAAR) 100 MG tablet Take 1 tablet (100 mg total) by mouth daily. 11/17/18   Einar Pheasant, MD  metFORMIN (GLUCOPHAGE) 500 MG tablet 2 tablets bid 11/13/18   Einar Pheasant, MD  methylPREDNISolone (MEDROL DOSEPAK) 4 MG TBPK tablet Take according to pack instructions 01/08/19   Jodelle Green, FNP  metoprolol succinate (TOPROL-XL) 50 MG 24 hr tablet Take 1 tablet (50 mg total) by mouth daily. Take with or immediately following a meal. 11/13/18   Einar Pheasant, MD  montelukast (SINGULAIR) 10 MG tablet Take 1 tablet (10 mg total) by mouth daily. 11/13/18   Einar Pheasant, MD  predniSONE (DELTASONE) 10 MG tablet Take 6 tablets x 1 day and then decrease by 1/2 tablet per day until down to zero mg. 01/09/19   Einar Pheasant, MD  RABEprazole (ACIPHEX) 20 MG tablet Take 1 tablet (20 mg total) by mouth 2 (two) times daily before a meal. 11/13/18   Einar Pheasant, MD  SPIRIVA RESPIMAT 2.5 MCG/ACT AERS USE 2 INHALATIONS DAILY 02/04/18   Flora Lipps, MD    Physical Exam: Vitals:   01/12/19 2139  BP: 128/82  Pulse: 84  Resp: 17  TempSrc: Oral  SpO2: 93%  Weight: 95.3 kg  Height: 5' 5"  (1.651 m)      Constitutional: NAD, calm, comfortable Vitals:   01/12/19 2139  BP: 128/82  Pulse: 84  Resp: 17  TempSrc: Oral  SpO2: 93%  Weight: 95.3 kg  Height: 5' 5"  (1.651 m)   Eyes: PERRL, lids and conjunctivae normal ENMT: Mucous membranes are moist. Posterior pharynx clear of any exudate or lesions.Normal dentition.  Neck: normal, supple, no masses, no thyromegaly Respiratory: clear to auscultation bilaterally, no wheezing, no crackles. Normal respiratory effort. No accessory muscle use.  Cardiovascular: Regular rate and rhythm, no murmurs / rubs / gallops. No extremity edema. 2+ pedal pulses. No carotid bruits.  Abdomen: no tenderness, no masses palpated. No hepatosplenomegaly. Bowel sounds positive.  Musculoskeletal: no clubbing /  cyanosis. No joint deformity upper and lower extremities. Good ROM, no contractures. Normal muscle tone.  Skin: no rashes, lesions, ulcers. No induration Neurologic: CN 2-12 grossly intact. Sensation intact, DTR normal. Strength 5/5 in all 4.  Psychiatric: Normal judgment and insight. Alert and oriented x 3. Normal mood.    Labs on Admission: I have personally reviewed following labs and imaging studies  CBC: Recent Labs  Lab 01/12/19 1400  WBC 6.2  NEUTROABS 5.3  HGB 15.3*  HCT 46.0  MCV 87.0  PLT 976*   Basic Metabolic Panel: Recent Labs  Lab 01/12/19 1400  NA 133*  K 4.0  CL 101  CO2 20*  GLUCOSE 249*  BUN 21  CREATININE 0.79  CALCIUM 8.1*   GFR: Estimated Creatinine Clearance: 82.2 mL/min (by C-G formula based on SCr of 0.79 mg/dL). Liver Function Tests: Recent Labs  Lab 01/12/19 1400  AST 43*  ALT 50*  ALKPHOS 49  BILITOT 0.8  PROT 7.1  ALBUMIN 3.4*   No results for input(s): LIPASE, AMYLASE in the last 168 hours. No results for input(s): AMMONIA in the last 168 hours. Coagulation Profile: No results for input(s): INR, PROTIME in the last 168 hours. Cardiac Enzymes: No results for input(s): CKTOTAL, CKMB, CKMBINDEX, TROPONINI in the last 168 hours. BNP (last 3 results) No results for input(s): PROBNP in the last 8760 hours. HbA1C: No results for input(s): HGBA1C in the last 72 hours. CBG: Recent Labs  Lab 01/12/19 2124  GLUCAP 278*   Lipid Profile: Recent Labs    01/12/19 1400  TRIG 293*   Thyroid Function Tests: No results for input(s): TSH, T4TOTAL, FREET4, T3FREE, THYROIDAB in the last 72 hours. Anemia Panel: Recent Labs    01/12/19 1400  FERRITIN 153   Urine analysis:    Component Value Date/Time   COLORURINE YELLOW 07/16/2013 1446   APPEARANCEUR CLEAR 07/16/2013 1446   LABSPEC 1.020 07/16/2013 1446   PHURINE 5.5 07/16/2013 1446   GLUCOSEU 100 (A) 07/16/2013 1446   HGBUR SMALL (A) 07/16/2013 1446   BILIRUBINUR neg 07/06/2015  1150   KETONESUR NEGATIVE 07/16/2013 1446   PROTEINUR neg 07/06/2015 1150   UROBILINOGEN 0.2 07/06/2015 1150   UROBILINOGEN 0.2 07/16/2013 1446   NITRITE neg 07/06/2015 1150   NITRITE NEGATIVE 07/16/2013 1446   LEUKOCYTESUR Negative 07/06/2015 1150   Sepsis Labs: !!!!!!!!!!!!!!!!!!!!!!!!!!!!!!!!!!!!!!!!!!!! @LABRCNTIP (procalcitonin:4,lacticidven:4) ) Recent Results (from the past 240 hour(s))  Novel Coronavirus, NAA ([facility])     Status: Abnormal   Collection Time: 01/08/19 12:00 AM   Specimen: Oropharyngeal(OP) collection in vial transport medium   OROPHARYNGEA  TESTING  Result Value Ref Range Status   SARS-CoV-2, NAA Detected (A) Not Detected Final    Comment: This nucleic acid amplification test was developed and its performance characteristics determined by [employer]. Nucleic acid amplification tests include PCR and TMA. This test has not been FDA cleared or approved. This test has been authorized by FDA under an Emergency Use Authorization (EUA). This test is only authorized for the duration of time the declaration that circumstances exist justifying the authorization of the emergency use of in vitro diagnostic tests for detection of SARS-CoV-2 virus and/or diagnosis of COVID-19 infection under section 564(b)(1) of the Act, 21 U.S.C. 017CBS-4(H) (1), unless the authorization is terminated or revoked sooner. When diagnostic testing is negative, the possibility of a false negative result should be considered in the context of a patient's recent exposures and the presence of clinical signs and symptoms consistent with COVID-19. An individual without symptoms of COVID-19 and who is not shedding SARS-CoV-2 virus would  expect to have a negative (not detected) result in this assay.      Radiological Exams on Admission: Dg Chest Port 1 View  Result Date: 01/12/2019 CLINICAL DATA:  COVID-19 positive with shortness of breath and fever. EXAM: PORTABLE CHEST 1 VIEW  COMPARISON:  10/13/2016 FINDINGS: The cardiac silhouette, mediastinal and hilar contours are within normal limits and stable. The lungs demonstrate underlying emphysematous changes. Low lung volumes with vascular crowding. Streaky bibasilar infiltrates but no effusions or edema. The bony thorax is intact. Stable right shoulder hardware. IMPRESSION: Underlying emphysema and low  lung volumes. Patchy basilar infiltrates. Electronically Signed   By: Marijo Sanes M.D.   On: 01/12/2019 13:41   Old chart reveiwed Case discussed with edp cxr reviewed bilatearl infiltrates   Assessment/Plan 63 yo female with acute hypoxia resp failure secondary to covid 19 bilateral pna  Principal Problem:   Pneumonia due to 2019-nCoV- decadron 12m iv daily, remdisivir, pt agrees to these meds.  Vit c/zinc/lovenox also given.  Supportive care.  Active Problems:   Diabetes mellitus ([place]- ssi ordered    COPD exacerbation ([place]- steroids as above, alb inhaler prn, currently not wheezing on exam    Acute respiratory failure due to COVID-19 (HCC)-supplemental oxygen and wean as tolerates to keep sats above 88%.  Treat infection as above      DVT prophylaxis: lovenox Code Status: full Family Communication: none Disposition Plan:  days Consults called:  none Admission status:  admission   , A MD [facility]  If 7PM-7AM, please contact night-coverage [email or web address] Password [email or web address]  01/12/2019, 11:21 PM

## 2019-01-12 NOTE — Telephone Encounter (Signed)
Patient is in the ED.

## 2019-01-12 NOTE — ED Triage Notes (Signed)
Pt in via EMS from home d/t inc SOB per pt. Pt known covid positive since Friday. 89% RA with EMS; 96% on 4L with EMS. COPD History. Fever last few days but denies fever today. 98.2 with EMS. 98HR with EMS. BG 52 with EMS pt given 15:15 rule and rechecked: BG 215 with EMS upon 2nd check.

## 2019-01-12 NOTE — ED Notes (Signed)
Pt updated. Pt given cup of ice as requested. Pt has personal soda. EDP Archie Balboa verbal this is okay. O2 inc to 3L as pt desat to 89-91% while sleeping. Blankets adjusted for pt.

## 2019-01-12 NOTE — ED Notes (Signed)
Report given to Brendolyn Patty, RN

## 2019-01-12 NOTE — ED Notes (Signed)
Report given to [facility] 

## 2019-01-12 NOTE — ED Notes (Signed)
EKG completed

## 2019-01-12 NOTE — ED Notes (Signed)
Pt resting with lights dimmed. Bed locked low. Call bell within reach. Rail up. Pt denies any needs currently.

## 2019-01-12 NOTE — Progress Notes (Signed)
Pharmacy Brief Note   O:  ALT: 50  SpO2: 93% on 3 L/min     A/P:  Patient meets requirements for remdesivir therapy.  Will start  remdesivir 200 mg IV x 1  followed by 100 mg IV daily x 4 days.  Monitor ALT  Royetta Asal, PharmD, BCPS 01/12/2019 10:01 PM

## 2019-01-12 NOTE — ED Notes (Signed)
Lab states extra gold tubes to be collected are SST tiger tops. Will send now.

## 2019-01-12 NOTE — ED Provider Notes (Signed)
[facility] Emergency Department Provider Note   ____________________________________________    I have reviewed the triage vital signs and the nursing notes.   HISTORY  Chief Complaint Shortness of Breath     HPI Amanda Wiley is a 62 y.o. female with a history of diabetes, COPD, hypertension who presents with complaints of shortness of breath.  Patient was recently diagnosed with novel coronavirus.  She reports over the last 2 to 3 days she has had worsening shortness of breath, she does complain of some wheezing as well.  She has been using albuterol inhaler with little improvement.  Does complain of myalgias, intermittent fevers.  Whole family has had coronavirus  Past Medical History:  Diagnosis Date  . COPD (chronic obstructive pulmonary disease) ([place])   . Crohn's disease ([place])   . Depression   . Diabetes mellitus ([place])   . Fracture, foot    post fall  . Hypercholesterolemia   . Hypertension   . IBS (irritable bowel syndrome)   . Nephrolithiasis    s/p lithotripsy [facility])    Patient Active Problem List   Diagnosis Date Noted  . Abnormal liver function tests 12/24/2018  . Hepatomegaly 12/24/2018  . Cyst of left kidney 12/22/2018  . Sinusitis 06/21/2018  . Polycythemia vera ([place]) 04/04/2018  . 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 Nipple discharge 02/16/2018  . Health care maintenance 12/11/2014  . Anxiety and depression 12/09/2014  . Allergic drug reaction 12/09/2014  . Personal history of urinary calculi 12/09/2014  . Obesity, diabetes, and hypertension syndrome ([place]) 04/18/2014  . COPD exacerbation ([place]) 05/27/2013  . Asthma 03/04/2012  . Vitamin D deficiency 03/04/2012  . Crohn's disease ([place]) 03/04/2012  . GERD (gastroesophageal reflux disease) 03/04/2012  . Diabetes mellitus ([place]) 03/04/2012  . Hypertension 03/04/2012  . Acid reflux 03/04/2012  . Essential (primary) hypertension 03/04/2012  .  Hypercholesterolemia without hypertriglyceridemia 03/04/2012  . CD (Crohn's disease) ([place]) 03/04/2012  . Adaptive colitis 01/30/2007    Past Surgical History:  Procedure Laterality Date  . ABDOMINAL HYSTERECTOMY  1985  . APPENDECTOMY  1985  . BREAST EXCISIONAL BIOPSY Right 2013   benign  . COLONOSCOPY  2014  . LAPAROSCOPIC CHOLECYSTECTOMY  2005   Dr Tamala Julian  . LAPAROSCOPIC SUPRACERVICAL HYSTERECTOMY  1985   left ovary not removed  . LITHOTRIPSY     x7  . OOPHORECTOMY    . POLYPECTOMY    . SHOULDER SURGERY     right (pinning)  . SHOULDER SURGERY     left (pinning)  . TONSILLECTOMY  1966    Prior to Admission medications   Medication Sig Start Date End Date Taking? Authorizing Provider  albuterol (PROVENTIL HFA;VENTOLIN HFA) 108 (90 Base) MCG/ACT inhaler Inhale 2 puffs into the lungs every 6 (six) hours as needed. 05/09/18   Flora Lipps, MD  ALPRAZolam Duanne Moron) 0.25 MG tablet Take 1 tablet (0.25 mg total) by mouth daily as needed for anxiety. 02/11/18   Einar Pheasant, MD  amLODipine (NORVASC) 5 MG tablet Take 1 tablet (5 mg total) by mouth daily. 11/13/18   Einar Pheasant, MD  azithromycin (ZITHROMAX) 250 MG tablet Take 2 tablets on day 1, take 1 tablet on days 2-5 01/08/19   Guse, Jacquelynn Cree, FNP  buPROPion (WELLBUTRIN XL) 300 MG 24 hr tablet Take 1 tablet (300 mg total) by mouth daily. 11/13/18   Einar Pheasant, MD  Dulaglutide (TRULICITY) 1.5 ML/4.6TK SOPN INJECT 1.5 MG UNDER THE SKIN EVERY 7 DAYS  05/12/18   [provider]  empagliflozin (JARDIANCE) 25 MG TABS tablet Take 25 mg by mouth daily.    [provider]  fluticasone (FLONASE) 50 MCG/ACT nasal spray Place 2 sprays into both nostrils daily. 10/17/17   Einar Pheasant, MD  fluticasone-salmeterol (ADVAIR HFA) [phone] MCG/ACT inhaler Inhale 2 puffs into the lungs 2 (two) times daily. 05/09/18   Flora Lipps, MD  furosemide (LASIX) 20 MG tablet Take 1 tablet (20 mg total) by mouth daily. 11/13/18   Einar Pheasant, MD   hyoscyamine (LEVSIN SL) 0.125 MG SL tablet Place 1 tablet (0.125 mg total) under the tongue 2 (two) times daily as needed. 11/13/18   Einar Pheasant, MD  losartan (COZAAR) 100 MG tablet Take 1 tablet (100 mg total) by mouth daily. 11/17/18   Einar Pheasant, MD  metFORMIN (GLUCOPHAGE) 500 MG tablet 2 tablets bid 11/13/18   Einar Pheasant, MD  methylPREDNISolone (MEDROL DOSEPAK) 4 MG TBPK tablet Take according to pack instructions 01/08/19   Jodelle Green, FNP  metoprolol succinate (TOPROL-XL) 50 MG 24 hr tablet Take 1 tablet (50 mg total) by mouth daily. Take with or immediately following a meal. 11/13/18   Einar Pheasant, MD  montelukast (SINGULAIR) 10 MG tablet Take 1 tablet (10 mg total) by mouth daily. 11/13/18   Einar Pheasant, MD  predniSONE (DELTASONE) 10 MG tablet Take 6 tablets x 1 day and then decrease by 1/2 tablet per day until down to zero mg. 01/09/19   Einar Pheasant, MD  RABEprazole (ACIPHEX) 20 MG tablet Take 1 tablet (20 mg total) by mouth 2 (two) times daily before a meal. 11/13/18   Einar Pheasant, MD  SPIRIVA RESPIMAT 2.5 MCG/ACT AERS USE 2 INHALATIONS DAILY 02/04/18   Flora Lipps, MD     Allergies Avelox [moxifloxacin hcl in nacl], Moxifloxacin, Allegra [fexofenadine hcl], Ibuprofen, Sulfa antibiotics, Tegretol [carbamazepine], Iodine, Ioxaglate, and Ivp dye [iodinated diagnostic agents]  Family History  Problem Relation Age of Onset  . Heart disease Father   . Emphysema Maternal Grandmother   . COPD Mother   . Hypercholesterolemia Mother   . Emphysema Mother   . Hypertension Brother   . Emphysema Other        grandmother  . Breast cancer Neg Hx   . Colon cancer Neg Hx     Social History Social History   Tobacco Use  . Smoking status: Former Smoker    Packs/day: 2.00    Years: 15.00    Pack years: 30.00    Quit date: 04/09/1994    Years since quitting: 24.7  . Smokeless tobacco: Never Used  . Tobacco comment: QUIT IN 1990'S  Substance Use Topics  . Alcohol  use: Not Currently    Alcohol/week: 0.0 standard drinks  . Drug use: No    Review of Systems  Constitutional: As above Eyes: No visual changes.  ENT: No sore throat. Cardiovascular: Denies chest pain. Respiratory: As above, positive cough Gastrointestinal: No abdominal pain.  No nausea, no vomiting.   Genitourinary: Negative for dysuria. Musculoskeletal: Myalgias Skin: Negative for rash. Neurological: Negative for headaches or weakness   ____________________________________________   PHYSICAL EXAM:  VITAL SIGNS: ED Triage Vitals  Enc Vitals Group     BP 01/12/19 1209 (!) 129/56     Pulse Rate 01/12/19 1225 97     Resp 01/12/19 1221 15     Temp 01/12/19 1221 98.5 F (36.9 C)     Temp Source 01/12/19 1221 Oral     SpO2  01/12/19 1225 95 %     Weight 01/12/19 1222 95.3 kg (210 lb)     Height 01/12/19 1222 1.651 m (5' 5" )     Head Circumference --      Peak Flow --      Pain Score 01/12/19 1221 4     Pain Loc --      Pain Edu? --      Excl. in [place]? --     Constitutional: Alert and oriented.   Nose: No congestion/rhinnorhea. Mouth/Throat: Mucous membranes are moist.    Cardiovascular: Normal rate, regular rhythm. Grossly normal heart sounds.  Good peripheral circulation. Respiratory: Normal respiratory effort.  No retractions.  Scattered wheezes Gastrointestinal: Soft and nontender. No distention.    Musculoskeletal: No lower extremity tenderness nor edema.  Warm and well perfused Neurologic:  Normal speech and language. No gross focal neurologic deficits are appreciated.  Skin:  Skin is warm, dry and intact. No rash noted. Psychiatric: Mood and affect are normal. Speech and behavior are normal.  ____________________________________________   LABS (all labs ordered are listed, but only abnormal results are displayed)  Labs Reviewed  CBC WITH DIFFERENTIAL/PLATELET - Abnormal; Notable for the following components:      Result Value   RBC 5.29 (*)     Hemoglobin 15.3 (*)    Platelets 137 (*)    Lymphs Abs 0.5 (*)    All other components within normal limits  COMPREHENSIVE METABOLIC PANEL - Abnormal; Notable for the following components:   Sodium 133 (*)    CO2 20 (*)    Glucose, Bld 249 (*)    Calcium 8.1 (*)    Albumin 3.4 (*)    AST 43 (*)    ALT 50 (*)    All other components within normal limits  TRIGLYCERIDES - Abnormal; Notable for the following components:   Triglycerides 293 (*)    All other components within normal limits  FIBRINOGEN - Abnormal; Notable for the following components:   Fibrinogen 528 (*)    All other components within normal limits  CULTURE, BLOOD (ROUTINE X 2)  CULTURE, BLOOD (ROUTINE X 2)  LACTIC ACID, PLASMA  FIBRIN DERIVATIVES D-DIMER ([facility] ONLY)  LACTATE DEHYDROGENASE  FERRITIN  PROCALCITONIN  C-REACTIVE PROTEIN   ____________________________________________  EKG  ED ECG REPORT I, Lavonia Drafts, the attending physician, personally viewed and interpreted this ECG.  Date: 01/12/2019  Rhythm: normal sinus rhythm QRS Axis: normal Intervals: normal ST/T Wave abnormalities: normal Narrative Interpretation: no evidence of acute ischemia  ____________________________________________  RADIOLOGY  Chest x-ray with bibasilar infiltrates ____________________________________________   PROCEDURES  Procedure(s) performed: No  Procedures   Critical Care performed: yes  CRITICAL CARE Performed by: Lavonia Drafts   Total critical care time: 30 minutes  Critical care time was exclusive of separately billable procedures and treating other patients.  Critical care was necessary to treat or prevent imminent or life-threatening deterioration.  Critical care was time spent personally by me on the following activities: development of treatment plan with patient and/or surrogate as well as nursing, discussions with consultants, evaluation of patient's response to treatment, examination of  patient, obtaining history from patient or surrogate, ordering and performing treatments and interventions, ordering and review of laboratory studies, ordering and review of radiographic studies, pulse oximetry and re-evaluation of patient's condition.  ____________________________________________   INITIAL IMPRESSION / ASSESSMENT AND PLAN / ED COURSE  Pertinent labs & imaging results that were available during my care of the patient were reviewed by  me and considered in my medical decision making (see chart for details).  Patient presents with hypoxia, she is requiring oxygen in the setting of COVID-19 diagnosis.  She also has COPD.  She will require nebulizers, oxygen therapy, will treat with dexamethasone.    We will discuss with physicians at [facility] for transfer    ____________________________________________   FINAL CLINICAL IMPRESSION(S) / ED DIAGNOSES  Final diagnoses:  Acute respiratory failure with hypoxia ([place])  COPD exacerbation ([place])  COVID-19        Note:  This document was prepared using Dragon voice recognition software and may include unintentional dictation errors.   Lavonia Drafts, MD 01/12/19 [phone]

## 2019-01-12 NOTE — ED Notes (Signed)
[name] pillow and HOB adjusted per request. Pt given warm blanket. Bed locked low. Rail up. Call bell within reach.

## 2019-01-12 NOTE — ED Notes (Signed)
Pt given food and drink dropped off by family. Pt shown/reminded where call bell attached to bed.

## 2019-01-13 DIAGNOSIS — E119 Type 2 diabetes mellitus without complications: Secondary | ICD-10-CM

## 2019-01-13 DIAGNOSIS — J441 Chronic obstructive pulmonary disease with (acute) exacerbation: Secondary | ICD-10-CM

## 2019-01-13 DIAGNOSIS — J9600 Acute respiratory failure, unspecified whether with hypoxia or hypercapnia: Secondary | ICD-10-CM

## 2019-01-13 LAB — COMPREHENSIVE METABOLIC PANEL
ALT: 45 U/L — ABNORMAL HIGH (ref 0–44)
AST: 27 U/L (ref 15–41)
Albumin: 3.1 g/dL — ABNORMAL LOW (ref 3.5–5.0)
Alkaline Phosphatase: 45 U/L (ref 38–126)
Anion gap: 12 (ref 5–15)
BUN: 26 mg/dL — ABNORMAL HIGH (ref 8–23)
CO2: 22 mmol/L (ref 22–32)
Calcium: 8.1 mg/dL — ABNORMAL LOW (ref 8.9–10.3)
Chloride: 106 mmol/L (ref 98–111)
Creatinine, Ser: 0.89 mg/dL (ref 0.44–1.00)
GFR calc Af Amer: >60 mL/min (ref >60)
GFR calc non Af Amer: >60 mL/min (ref >60)
Glucose, Bld: 252 mg/dL — ABNORMAL HIGH (ref 70–99)
Potassium: 4.4 mmol/L (ref 3.5–5.1)
Sodium: 140 mmol/L (ref 135–145)
Total Bilirubin: 0.6 mg/dL (ref 0.3–1.2)
Total Protein: 6.6 g/dL (ref 6.5–8.1)

## 2019-01-13 LAB — CBC WITH DIFFERENTIAL/PLATELET
Abs Immature Granulocytes: 0.03 10*3/uL (ref 0.00–0.07)
Basophils Absolute: 0 10*3/uL (ref 0.0–0.1)
Basophils Relative: 1 %
Eosinophils Absolute: 0 10*3/uL (ref 0.0–0.5)
Eosinophils Relative: 0 %
HCT: 45.3 % (ref 36.0–46.0)
Hemoglobin: 14.5 g/dL (ref 12.0–15.0)
Immature Granulocytes: 1 %
Lymphocytes Relative: 16 %
Lymphs Abs: 0.7 10*3/uL (ref 0.7–4.0)
MCH: 28.7 pg (ref 26.0–34.0)
MCHC: 32 g/dL (ref 30.0–36.0)
MCV: 89.5 fL (ref 80.0–100.0)
Monocytes Absolute: 0.4 10*3/uL (ref 0.1–1.0)
Monocytes Relative: 9 %
Neutro Abs: 3.2 10*3/uL (ref 1.7–7.7)
Neutrophils Relative %: 73 %
Platelets: 138 10*3/uL — ABNORMAL LOW (ref 150–400)
RBC: 5.06 MIL/uL (ref 3.87–5.11)
RDW: 14.7 % (ref 11.5–15.5)
WBC: 4.3 10*3/uL (ref 4.0–10.5)
nRBC: 0 % (ref 0.0–0.2)

## 2019-01-13 LAB — C-REACTIVE PROTEIN: CRP: 6.5 mg/dL — ABNORMAL HIGH (ref <1.0)

## 2019-01-13 LAB — D-DIMER, QUANTITATIVE: D-Dimer, Quant: 0.36 ug/mL-FEU (ref 0.00–0.50)

## 2019-01-13 LAB — HEMOGLOBIN A1C
Hgb A1c MFr Bld: 7.3 % — ABNORMAL HIGH (ref 4.8–5.6)
Mean Plasma Glucose: 162.81 mg/dL

## 2019-01-13 LAB — GLUCOSE, CAPILLARY
Glucose-Capillary: 187 mg/dL — ABNORMAL HIGH (ref 70–99)
Glucose-Capillary: 198 mg/dL — ABNORMAL HIGH (ref 70–99)
Glucose-Capillary: 303 mg/dL — ABNORMAL HIGH (ref 70–99)
Glucose-Capillary: 230 mg/dL — ABNORMAL HIGH (ref 70–99)

## 2019-01-13 LAB — HIV ANTIBODY (ROUTINE TESTING W REFLEX): HIV Screen 4th Generation wRfx: NONREACTIVE

## 2019-01-13 LAB — ABO/RH: ABO/RH(D): A POS

## 2019-01-13 MED ORDER — ORAL CARE MOUTH RINSE
15.0000 mL | Freq: Two times a day (BID) | OROMUCOSAL | Status: DC
  Administered 2019-01-13 – 2019-01-19 (×12): 15 mL via OROMUCOSAL

## 2019-01-13 MED ORDER — INSULIN ASPART 100 UNIT/ML ~~LOC~~ SOLN
0.0000 [IU] | Freq: Every day | SUBCUTANEOUS | Status: DC
  Administered 2019-01-13 – 2019-01-16 (×4): 2 [IU] via SUBCUTANEOUS
  Administered 2019-01-17: 4 [IU] via SUBCUTANEOUS
  Administered 2019-01-18: 2 [IU] via SUBCUTANEOUS

## 2019-01-13 MED ORDER — ENSURE ENLIVE PO LIQD
237.0000 mL | Freq: Two times a day (BID) | ORAL | Status: DC
  Administered 2019-01-16 – 2019-01-19 (×2): 237 mL via ORAL

## 2019-01-13 MED ORDER — DIPHENHYDRAMINE HCL 25 MG PO CAPS
25.0000 mg | ORAL_CAPSULE | ORAL | Status: DC | PRN
  Administered 2019-01-13: 25 mg via ORAL
  Filled 2019-01-13: qty 1

## 2019-01-13 MED ORDER — ENOXAPARIN SODIUM 60 MG/0.6ML ~~LOC~~ SOLN
50.0000 mg | Freq: Every day | SUBCUTANEOUS | Status: DC
  Administered 2019-01-13 – 2019-01-18 (×6): 50 mg via SUBCUTANEOUS
  Filled 2019-01-13 (×6): qty 0.6

## 2019-01-13 MED ORDER — SODIUM CHLORIDE 0.9% IV SOLUTION
Freq: Once | INTRAVENOUS | Status: AC
  Administered 2019-01-13: 12:00:00 via INTRAVENOUS

## 2019-01-13 MED ORDER — BENZONATATE 100 MG PO CAPS
100.0000 mg | ORAL_CAPSULE | Freq: Three times a day (TID) | ORAL | Status: DC
  Administered 2019-01-13 – 2019-01-19 (×18): 100 mg via ORAL
  Filled 2019-01-13 (×20): qty 1

## 2019-01-13 MED ORDER — DIPHENHYDRAMINE HCL 50 MG/ML IJ SOLN
25.0000 mg | Freq: Once | INTRAMUSCULAR | Status: AC
  Administered 2019-01-13: 17:00:00 25 mg via INTRAVENOUS
  Filled 2019-01-13: qty 1

## 2019-01-13 MED ORDER — INSULIN ASPART 100 UNIT/ML ~~LOC~~ SOLN
0.0000 [IU] | Freq: Three times a day (TID) | SUBCUTANEOUS | Status: DC
  Administered 2019-01-13: 15 [IU] via SUBCUTANEOUS
  Administered 2019-01-14: 11 [IU] via SUBCUTANEOUS
  Administered 2019-01-14 (×2): 7 [IU] via SUBCUTANEOUS
  Administered 2019-01-15: 08:00:00 4 [IU] via SUBCUTANEOUS
  Administered 2019-01-15: 13:00:00 7 [IU] via SUBCUTANEOUS
  Administered 2019-01-15: 20 [IU] via SUBCUTANEOUS
  Administered 2019-01-16: 12:00:00 11 [IU] via SUBCUTANEOUS
  Administered 2019-01-16: 16:00:00 20 [IU] via SUBCUTANEOUS
  Administered 2019-01-16: 10:00:00 4 [IU] via SUBCUTANEOUS
  Administered 2019-01-17 (×2): 7 [IU] via SUBCUTANEOUS
  Administered 2019-01-17: 15 [IU] via SUBCUTANEOUS
  Administered 2019-01-18: 17:00:00 7 [IU] via SUBCUTANEOUS
  Administered 2019-01-18: 11 [IU] via SUBCUTANEOUS
  Administered 2019-01-18: 7 [IU] via SUBCUTANEOUS
  Administered 2019-01-19: 0.3 [IU] via SUBCUTANEOUS
  Administered 2019-01-19: 12:00:00 20 [IU] via SUBCUTANEOUS

## 2019-01-13 MED ORDER — DEXAMETHASONE SODIUM PHOSPHATE 10 MG/ML IJ SOLN
6.0000 mg | Freq: Two times a day (BID) | INTRAMUSCULAR | Status: DC
  Administered 2019-01-13: 6 mg via INTRAVENOUS
  Filled 2019-01-13: qty 1

## 2019-01-13 MED ORDER — BUPROPION HCL ER (XL) 300 MG PO TB24
300.0000 mg | ORAL_TABLET | Freq: Every day | ORAL | Status: DC
  Administered 2019-01-13 – 2019-01-19 (×7): 300 mg via ORAL
  Filled 2019-01-13 (×8): qty 1

## 2019-01-13 NOTE — Plan of Care (Signed)
Pt slept well during the night. No complaints of pain verbalized. Alert and oriented. Vitals stable on 3L O2 via nasal cannula. Minimal dyspnea on exertion and dry cough noted, prn Robitussin and Tussionex given. Independent with ADLs. Skin assessed, intact. IV x2, both SL.  Indepenedently repositions for pressure relief, unable to tolerate proning. First dose Remdesivir given. No other issues, will monitor.   Problem: Education: Goal: Knowledge of risk factors and measures for prevention of condition will improve Outcome: Progressing   Problem: Coping: Goal: Psychosocial and spiritual needs will be supported Outcome: Progressing   Problem: Respiratory: Goal: Will maintain a patent airway Outcome: Progressing Goal: Complications related to the disease process, condition or treatment will be avoided or minimized Outcome: Progressing

## 2019-01-13 NOTE — Telephone Encounter (Signed)
IF SHE LIVES ALONE...then she can refill DOUNEB   IF SHE LIVES WITH ANYONE...SHE CAN NOT do DOUNEBS  She will need ALBUTEROL MDI 2-4 pufs every 4 hrs as needed

## 2019-01-13 NOTE — Progress Notes (Signed)
Phone call update deferred by patient, who has already spoken to and updated her immediate family. I asked her to let me know if they have any questions or would like to be contacted at any point in the shift.

## 2019-01-13 NOTE — Progress Notes (Signed)
   01/13/19 1424  Vitals  Vital Signs Type (Include Temp, Pulse, RR, and B/P) 15 min. post blood start  Temp 98.5 F (36.9 C)  Temp Source Oral  Pulse Rate 79  ECG Heart Rate 80  Resp 20  BP (!) 119/59  Oxygen Therapy  SpO2 94 %  O2 Device Nasal Cannula  O2 Flow Rate (L/min) 5 L/min   15 min post start are above. Patient is stable with no complaints. Will continue to monitor q69mn until transfusion is complete.

## 2019-01-13 NOTE — Progress Notes (Signed)
PROGRESS NOTE  Amanda Wiley  TGY:[id number] DOB: 19-Apr-1955 DOA: 01/12/2019 PCP: Einar Pheasant, MD   Brief Narrative: AURIELLA Wiley is a 63 y.o. female with a history of COPD, T2DM, HTN, HLD, hepatomegaly, obesity, who came to the ED 4 days after + covid testing as an outpatient with shortness of breath. Hypoxic to 86-87% on room air, improved on 3L O2. CXR with bibasilar infiltrates, CRP elevated to 4.3, though ferritin, LDH, lactic acid, and d-dimer wnl. AST 43, ALT 50. WBC 6.2, platelets 137. Decadron given and patient admitted to [facility], started on remdesivir. Due to advancing hypoxia, convalescent plasma is ordered.  Assessment & Plan: Principal Problem:   Pneumonia due to 2019-nCoV Active Problems:   Diabetes mellitus ([place])   COPD exacerbation ([facility])   Acute respiratory failure due to COVID-19 [facility])  Acute hypoxic respiratory failure due to covid-19 pneumonia: Hypoxia seems to be progressing. Confirmed patient is full code, up to 5L [place]. Lactic acid and procalcitonin reassuringly normal/negative.  - Continue remdesivir x5 days - Continue decadron, will augment to BID dosing. CRP has trended upward.  - Discussed in detail the rationale, suspected benefits and risks/adverse effects possible with use of covid convalescent plasma under EUA per FDA. Information provided and patient agrees to transfusion. Information sheet signed. - Continue airborne, contact precautions. PPE including surgical gown, gloves, cap, shoe covers, and CAPR used during this encounter in a negative pressure room.  - Check daily labs: CBC w/diff, CMP, d-dimer, ferritin, CRP - Enoxaparin weight-based prophylactic dosing. - Avoid NSAIDs - Recommend proning and aggressive use of incentive spirometry.  T2DM: HbA1c 7.3% - Anticipate steroid-induced hyperglycemia, will augment SSI.   Hepatic steatosis and hepatomegaly: Per recent RUQ U/S.  - Monitor LFTs  History of PCV: Noted hgb 15.7 >> 14.5.    Thrombocytopenia: ?if related to covid or hepatomegaly.  - Stable, will continue monitoring.  Obesity: BMI 34  DVT prophylaxis: Lovenox Code Status: [place] Family Communication: None at bedside. Pt to communicate POC. Disposition Plan: Uncertain  Consultants:   None  Procedures:   Covid convalescent plasma 01/13/2019  Antimicrobials:  Remdesivir 10/5 - 10/9   Subjective: Coughing is persistent, severe, associated with headache and nonproductive. Dyspnea worsens during coughing and she feels this is overall a little worse from admission. Doesn't feel like typical COPD exacerbation, as there's no typical wheezing. Not sure how they got it but both her and her husband are positive as well as much of the rest of her family.  Objective: Vitals:   01/12/19 2139 01/13/19 0327 01/13/19 0805  BP: 128/82 121/64 138/72  Pulse: 84 75 77  Resp: 17 19 19   Temp: 98.7 F (37.1 C) 97.9 F (36.6 C) 98 F (36.7 C)  TempSrc: Oral Oral Oral  SpO2: 93% 94% 93%  Weight: 95.3 kg 94.8 kg   Height: 5' 5"  (1.651 m)      Intake/Output Summary (Last 24 hours) at 01/13/2019 1011 Last data filed at 01/13/2019 0900 Gross per 24 hour  Intake 1090 ml  Output -  Net 1090 ml   Filed Weights   01/12/19 2139 01/13/19 0327  Weight: 95.3 kg 94.8 kg    Gen: 63 y.o. female in no distress Pulm: Non-labored but tachypneic, worse with constant coughing. Crackles bilaterally, shallow inspirations. No expiratory wheezing. CV: Regular rate and rhythm. No murmur, rub, or gallop. No JVD, no pedal edema. GI: Abdomen soft, non-tender, non-distended, with normoactive bowel sounds. No organomegaly or masses felt. Ext: Warm, no deformities Skin:  No rashes, lesions or ulcers Neuro: Alert and oriented. No focal neurological deficits. Psych: Judgement and insight appear normal. Mood & affect appropriate.   Data Reviewed: I have personally reviewed following labs and imaging studies  CBC: Recent Labs  Lab  01/12/19 1400 01/13/19 0422  WBC 6.2 4.3  NEUTROABS 5.3 3.2  HGB 15.3* 14.5  HCT 46.0 45.3  MCV 87.0 89.5  PLT 137* 470*   Basic Metabolic Panel: Recent Labs  Lab 01/12/19 1400 01/13/19 0422  NA 133* 140  K 4.0 4.4  CL 101 106  CO2 20* 22  GLUCOSE 249* 252*  BUN 21 26*  CREATININE 0.79 0.89  CALCIUM 8.1* 8.1*   GFR: Estimated Creatinine Clearance: 73.6 mL/min (by C-G formula based on SCr of 0.89 mg/dL). Liver Function Tests: Recent Labs  Lab 01/12/19 1400 01/13/19 0422  AST 43* 27  ALT 50* 45*  ALKPHOS 49 45  BILITOT 0.8 0.6  PROT 7.1 6.6  ALBUMIN 3.4* 3.1*   No results for input(s): LIPASE, AMYLASE in the last 168 hours. No results for input(s): AMMONIA in the last 168 hours. Coagulation Profile: No results for input(s): INR, PROTIME in the last 168 hours. Cardiac Enzymes: No results for input(s): CKTOTAL, CKMB, CKMBINDEX, TROPONINI in the last 168 hours. BNP (last 3 results) No results for input(s): PROBNP in the last 8760 hours. HbA1C: Recent Labs    01/13/19 0422  HGBA1C 7.3*   CBG: Recent Labs  Lab 01/12/19 2124 01/13/19 0739  GLUCAP 278* 187*   Lipid Profile: Recent Labs    01/12/19 1400  TRIG 293*   Thyroid Function Tests: No results for input(s): TSH, T4TOTAL, FREET4, T3FREE, THYROIDAB in the last 72 hours. Anemia Panel: Recent Labs    01/12/19 1400  FERRITIN 153   Urine analysis:    Component Value Date/Time   COLORURINE YELLOW 07/16/2013 1446   APPEARANCEUR CLEAR 07/16/2013 1446   LABSPEC 1.020 07/16/2013 1446   PHURINE 5.5 07/16/2013 1446   GLUCOSEU 100 (A) 07/16/2013 1446   HGBUR SMALL (A) 07/16/2013 1446   BILIRUBINUR neg 07/06/2015 1150   KETONESUR NEGATIVE 07/16/2013 1446   PROTEINUR neg 07/06/2015 1150   UROBILINOGEN 0.2 07/06/2015 1150   UROBILINOGEN 0.2 07/16/2013 1446   NITRITE neg 07/06/2015 1150   NITRITE NEGATIVE 07/16/2013 1446   LEUKOCYTESUR Negative 07/06/2015 1150   Recent Results (from the past 240  hour(s))  Novel Coronavirus, NAA ([facility])     Status: Abnormal   Collection Time: 01/08/19 12:00 AM   Specimen: Oropharyngeal(OP) collection in vial transport medium   OROPHARYNGEA  TESTING  Result Value Ref Range Status   SARS-CoV-2, NAA Detected (A) Not Detected Final    Comment: This nucleic acid amplification test was developed and its performance characteristics determined by [employer]. Nucleic acid amplification tests include PCR and TMA. This test has not been FDA cleared or approved. This test has been authorized by FDA under an Emergency Use Authorization (EUA). This test is only authorized for the duration of time the declaration that circumstances exist justifying the authorization of the emergency use of in vitro diagnostic tests for detection of SARS-CoV-2 virus and/or diagnosis of COVID-19 infection under section 564(b)(1) of the Act, 21 U.S.C. 962EZM-6(Q) (1), unless the authorization is terminated or revoked sooner. When diagnostic testing is negative, the possibility of a false negative result should be considered in the context of a patient's recent exposures and the presence of clinical signs and symptoms consistent with COVID-19. An individual without symptoms of  COVID-19 and who is not shedding SARS-CoV-2 virus would  expect to have a negative (not detected) result in this assay.   Blood Culture (routine x 2)     Status: None (Preliminary result)   Collection Time: 01/12/19  2:00 PM   Specimen: BLOOD  Result Value Ref Range Status   Specimen Description BLOOD BLOOD RIGHT HAND  Final   Special Requests   Final    BOTTLES DRAWN AEROBIC AND ANAEROBIC Blood Culture results may not be optimal due to an inadequate volume of blood received in culture bottles   Culture   Final    NO GROWTH < 24 HOURS Performed at [facility], [address], [place], [place] [address]    Report Status PENDING  Incomplete  Blood Culture (routine x 2)      Status: None (Preliminary result)   Collection Time: 01/12/19  2:00 PM   Specimen: BLOOD  Result Value Ref Range Status   Specimen Description BLOOD RIGHT ANTECUBITAL  Final   Special Requests   Final    BOTTLES DRAWN AEROBIC AND ANAEROBIC Blood Culture adequate volume   Culture   Final    NO GROWTH < 24 HOURS Performed at [facility], [address], [place], [place] [address]    Report Status PENDING  Incomplete      Radiology Studies: Dg Chest Port 1 View  Result Date: 01/12/2019 CLINICAL DATA:  COVID-19 positive with shortness of breath and fever. EXAM: PORTABLE CHEST 1 VIEW COMPARISON:  10/13/2016 FINDINGS: The cardiac silhouette, mediastinal and hilar contours are within normal limits and stable. The lungs demonstrate underlying emphysematous changes. Low lung volumes with vascular crowding. Streaky bibasilar infiltrates but no effusions or edema. The bony thorax is intact. Stable right shoulder hardware. IMPRESSION: Underlying emphysema and low lung volumes. Patchy basilar infiltrates. Electronically Signed   By: Marijo Sanes M.D.   On: 01/12/2019 13:41    Scheduled Meds: . sodium chloride   Intravenous Once  . albuterol  2 puff Inhalation Q6H  . dexamethasone (DECADRON) injection  6 mg Intravenous Q24H  . enoxaparin (LOVENOX) injection  40 mg Subcutaneous QHS  . insulin aspart  0-9 Units Subcutaneous TID WC  . sodium chloride flush  3 mL Intravenous Q12H  . vitamin C  500 mg Oral Daily  . zinc sulfate  220 mg Oral Daily   Continuous Infusions: . sodium chloride    . remdesivir 100 mg in NS 250 mL       LOS: 1 day   Time spent: 35 minutes.  Patrecia Pour, MD [facility] [email or web address] Password [email or web address] 01/13/2019, 10:11 AM

## 2019-01-13 NOTE — Progress Notes (Signed)
Initial Nutrition Assessment  DOCUMENTATION CODES:   Obesity unspecified  INTERVENTION:   Ensure Enlive po BID, each supplement provides 350 kcal and 20 grams of protein  Pt receiving Hormel Shake daily with Breakfast which provides 520 kcals and 22 g of protein and Magic cup BID with lunch and dinner, each supplement provides 290 kcal and 9 grams of protein, automatically on meal trays to optimize nutritional intake.   Encourage PO intake    NUTRITION DIAGNOSIS:   Increased nutrient needs related to (COVID-19  PNA) as evidenced by estimated needs.  GOAL:   Patient will meet greater than or equal to 90% of their needs  MONITOR:   Supplement acceptance, PO intake  REASON FOR ASSESSMENT:   Consult Assessment of nutrition requirement/status  ASSESSMENT:   Pt with PMH of DM, COPD, HTN, and HLD who was admitted with COVID-19 PNA.   Pt on 3L O2 via nasal cannula   Medications reviewed and include: decadron, vitamin C, zinc  Labs reviewed    NUTRITION - FOCUSED PHYSICAL EXAM:  Deferred; RD working remotely   Diet Order:   Diet Order            Diet Heart Room service appropriate? Yes; Fluid consistency: Thin  Diet effective now              EDUCATION NEEDS:   No education needs have been identified at this time  Skin:  Skin Assessment: Reviewed RN Assessment  Last BM:  10/4  Height:   Ht Readings from Last 1 Encounters:  01/12/19 5' 5"  (1.651 m)    Weight:   Wt Readings from Last 1 Encounters:  01/13/19 94.8 kg    Ideal Body Weight:  56.8 kg  BMI:  Body mass index is 34.78 kg/m.  Estimated Nutritional Needs:   Kcal:  1800-2000  Protein:  105-115 grams  Fluid:  > 1.8 L/day  Maylon Peppers RD, LDN, CNSC [phone] Pager [phone] After Hours Pager

## 2019-01-13 NOTE — Progress Notes (Signed)
Pt admitted from [place] via EMT.Vitals stable on 3L O2 via nasal cannula. Pt alert and oriented. No complaints verbalized. Skin swarm done with RN Reinier E. CHG bath completed. Admission questions and assessment done. Due meds given. Oriented to unit setup and routine. On call MD paged, awaiting admission orders. Will monitor.

## 2019-01-13 NOTE — Progress Notes (Addendum)
1925: On call MD paged requesting more benadryl per pt request  2015: Pt stating she has not had any home meds since admission. Pharmacy stated MD needs to verify these still. MD notified 2052: Second page sent restating above.  Pt declined RN to update family. She has been keeping them updated throughout the day

## 2019-01-13 NOTE — Telephone Encounter (Signed)
Pt is currently admitted.  Spoke to pt's husband, who stated that pt does not live alone.  Will follow up with pt regarding ventolin once she is discharged.

## 2019-01-13 NOTE — Progress Notes (Signed)
   01/13/19 1400  Pre-Transfusion Documentation  Blood Consent Obtained Yes  History of previous reaction? No  Pre-Meds Given? No pre-meds ordered  Patient education Done  Vitals  Vital Signs Type (Include Temp, Pulse, RR, and B/P) Pre-blood (within 30 minutes)  Temp 98.5 F (36.9 C)  Temp Source Oral  Pulse Rate 76  ECG Heart Rate 76  Resp (!) 24  BP 131/67  Oxygen Therapy  SpO2 93 %  O2 Device Nasal Cannula  O2 Flow Rate (L/min) 5 L/min  Transfuse fresh frozen plasma  Volume 344  Blood Admin Supplies Y set with filter   Plasma started at this time. VS above. No history of reaction. Verified product with second RN. Patient will be monitored for the first 15 minutes with RN and every 30 minutes thereafter

## 2019-01-14 LAB — CBC WITH DIFFERENTIAL/PLATELET
Abs Immature Granulocytes: 0.07 10*3/uL (ref 0.00–0.07)
Basophils Absolute: 0 10*3/uL (ref 0.0–0.1)
Basophils Relative: 0 %
Eosinophils Absolute: 0 10*3/uL (ref 0.0–0.5)
Eosinophils Relative: 0 %
HCT: 47.3 % — ABNORMAL HIGH (ref 36.0–46.0)
Hemoglobin: 15.2 g/dL — ABNORMAL HIGH (ref 12.0–15.0)
Immature Granulocytes: 2 %
Lymphocytes Relative: 13 %
Lymphs Abs: 0.6 10*3/uL — ABNORMAL LOW (ref 0.7–4.0)
MCH: 29.1 pg (ref 26.0–34.0)
MCHC: 32.1 g/dL (ref 30.0–36.0)
MCV: 90.4 fL (ref 80.0–100.0)
Monocytes Absolute: 0.3 10*3/uL (ref 0.1–1.0)
Monocytes Relative: 6 %
Neutro Abs: 3.8 10*3/uL (ref 1.7–7.7)
Neutrophils Relative %: 79 %
Platelets: 141 10*3/uL — ABNORMAL LOW (ref 150–400)
RBC: 5.23 MIL/uL — ABNORMAL HIGH (ref 3.87–5.11)
RDW: 14.8 % (ref 11.5–15.5)
WBC: 4.8 10*3/uL (ref 4.0–10.5)
nRBC: 0 % (ref 0.0–0.2)

## 2019-01-14 LAB — PREPARE FRESH FROZEN PLASMA: Unit division: 0

## 2019-01-14 LAB — COMPREHENSIVE METABOLIC PANEL
ALT: 42 U/L (ref 0–44)
AST: 30 U/L (ref 15–41)
Albumin: 3.4 g/dL — ABNORMAL LOW (ref 3.5–5.0)
Alkaline Phosphatase: 45 U/L (ref 38–126)
Anion gap: 16 — ABNORMAL HIGH (ref 5–15)
BUN: 26 mg/dL — ABNORMAL HIGH (ref 8–23)
CO2: 22 mmol/L (ref 22–32)
Calcium: 8.8 mg/dL — ABNORMAL LOW (ref 8.9–10.3)
Chloride: 103 mmol/L (ref 98–111)
Creatinine, Ser: 0.92 mg/dL (ref 0.44–1.00)
GFR calc Af Amer: >60 mL/min (ref >60)
GFR calc non Af Amer: >60 mL/min (ref >60)
Glucose, Bld: 249 mg/dL — ABNORMAL HIGH (ref 70–99)
Potassium: 4.7 mmol/L (ref 3.5–5.1)
Sodium: 141 mmol/L (ref 135–145)
Total Bilirubin: 0.8 mg/dL (ref 0.3–1.2)
Total Protein: 7.2 g/dL (ref 6.5–8.1)

## 2019-01-14 LAB — BPAM FFP
Blood Product Expiration Date: 202010071327
ISSUE DATE / TIME: 202010061335
Unit Type and Rh: 6200

## 2019-01-14 LAB — C-REACTIVE PROTEIN: CRP: 3.5 mg/dL — ABNORMAL HIGH (ref <1.0)

## 2019-01-14 LAB — D-DIMER, QUANTITATIVE: D-Dimer, Quant: 0.4 ug/mL-FEU (ref 0.00–0.50)

## 2019-01-14 LAB — GLUCOSE, CAPILLARY
Glucose-Capillary: 233 mg/dL — ABNORMAL HIGH (ref 70–99)
Glucose-Capillary: 226 mg/dL — ABNORMAL HIGH (ref 70–99)
Glucose-Capillary: 270 mg/dL — ABNORMAL HIGH (ref 70–99)

## 2019-01-14 MED ORDER — FLUTICASONE FUROATE-VILANTEROL 200-25 MCG/INH IN AEPB
1.0000 | INHALATION_SPRAY | Freq: Every day | RESPIRATORY_TRACT | Status: DC
  Administered 2019-01-14 – 2019-01-19 (×6): 1 via RESPIRATORY_TRACT
  Filled 2019-01-14: qty 28

## 2019-01-14 MED ORDER — MONTELUKAST SODIUM 10 MG PO TABS
10.0000 mg | ORAL_TABLET | Freq: Every day | ORAL | Status: DC
  Administered 2019-01-14 – 2019-01-18 (×5): 10 mg via ORAL
  Filled 2019-01-14 (×6): qty 1

## 2019-01-14 MED ORDER — PANTOPRAZOLE SODIUM 40 MG PO TBEC
40.0000 mg | DELAYED_RELEASE_TABLET | Freq: Every day | ORAL | Status: DC
  Administered 2019-01-14 – 2019-01-19 (×6): 40 mg via ORAL
  Filled 2019-01-14 (×7): qty 1

## 2019-01-14 MED ORDER — TIOTROPIUM BROMIDE MONOHYDRATE 2.5 MCG/ACT IN AERS: 2.0000 | INHALATION_SPRAY | Freq: Every day | RESPIRATORY_TRACT | Status: DC

## 2019-01-14 MED ORDER — UMECLIDINIUM BROMIDE 62.5 MCG/INH IN AEPB
1.0000 | INHALATION_SPRAY | Freq: Every day | RESPIRATORY_TRACT | Status: DC
  Administered 2019-01-14 – 2019-01-19 (×6): 1 via RESPIRATORY_TRACT
  Filled 2019-01-14: qty 7

## 2019-01-14 MED ORDER — LINAGLIPTIN 5 MG PO TABS
5.0000 mg | ORAL_TABLET | Freq: Every day | ORAL | Status: DC
  Administered 2019-01-14 – 2019-01-19 (×6): 5 mg via ORAL
  Filled 2019-01-14 (×7): qty 1

## 2019-01-14 MED ORDER — METOPROLOL SUCCINATE ER 50 MG PO TB24
50.0000 mg | ORAL_TABLET | Freq: Every day | ORAL | Status: DC
  Administered 2019-01-14 – 2019-01-19 (×6): 50 mg via ORAL
  Filled 2019-01-14 (×6): qty 1
  Filled 2019-01-14: qty 0.5

## 2019-01-14 MED ORDER — ALPRAZOLAM 0.5 MG PO TABS: 0.2500 mg | ORAL_TABLET | Freq: Every day | ORAL | Status: DC | PRN

## 2019-01-14 MED ORDER — ALBUTEROL SULFATE HFA 108 (90 BASE) MCG/ACT IN AERS
2.0000 | INHALATION_SPRAY | Freq: Four times a day (QID) | RESPIRATORY_TRACT | Status: DC | PRN
  Administered 2019-01-14 – 2019-01-17 (×4): 2 via RESPIRATORY_TRACT

## 2019-01-14 MED ORDER — LOSARTAN POTASSIUM 25 MG PO TABS
100.0000 mg | ORAL_TABLET | Freq: Every day | ORAL | Status: DC
  Administered 2019-01-14 – 2019-01-19 (×5): 100 mg via ORAL
  Filled 2019-01-14 (×7): qty 4

## 2019-01-14 MED ORDER — BUPROPION HCL ER (XL) 300 MG PO TB24: 300.0000 mg | ORAL_TABLET | Freq: Every day | ORAL | Status: DC

## 2019-01-14 MED ORDER — HYOSCYAMINE SULFATE 0.125 MG SL SUBL
0.1250 mg | SUBLINGUAL_TABLET | Freq: Two times a day (BID) | SUBLINGUAL | Status: DC | PRN
  Filled 2019-01-14: qty 1

## 2019-01-14 MED ORDER — DEXAMETHASONE SODIUM PHOSPHATE 10 MG/ML IJ SOLN
6.0000 mg | INTRAMUSCULAR | Status: AC
  Administered 2019-01-14 – 2019-01-16 (×3): 6 mg via INTRAVENOUS
  Filled 2019-01-14 (×3): qty 1

## 2019-01-14 MED ORDER — AMLODIPINE BESYLATE 5 MG PO TABS
5.0000 mg | ORAL_TABLET | Freq: Every day | ORAL | Status: DC
  Administered 2019-01-14 – 2019-01-19 (×4): 5 mg via ORAL
  Filled 2019-01-14 (×6): qty 1

## 2019-01-14 MED ORDER — FLUTICASONE PROPIONATE 50 MCG/ACT NA SUSP
2.0000 | Freq: Every day | NASAL | Status: DC
  Administered 2019-01-16 – 2019-01-18 (×3): 2 via NASAL
  Filled 2019-01-14: qty 16

## 2019-01-14 MED ORDER — INSULIN GLARGINE 100 UNIT/ML ~~LOC~~ SOLN
16.0000 [IU] | Freq: Every day | SUBCUTANEOUS | Status: DC
  Administered 2019-01-14 – 2019-01-16 (×3): 16 [IU] via SUBCUTANEOUS
  Filled 2019-01-14 (×4): qty 0.16

## 2019-01-14 NOTE — Progress Notes (Signed)
Ambulated patient in hallway, required increase in oxygen from 2 to 4L to maintain saturation above 86%.

## 2019-01-14 NOTE — Plan of Care (Signed)
  Problem: Education: Goal: Knowledge of risk factors and measures for prevention of condition will improve Outcome: Progressing   Problem: Coping: Goal: Psychosocial and spiritual needs will be supported Outcome: Progressing   Problem: Respiratory: Goal: Will maintain a patent airway Outcome: Progressing Goal: Complications related to the disease process, condition or treatment will be avoided or minimized Outcome: Progressing   

## 2019-01-14 NOTE — Progress Notes (Signed)
Spoke with patient's son in law [place] on speaker phone with patient present to update on patient condition and plan of care. All questions answered.

## 2019-01-14 NOTE — Progress Notes (Signed)
Called and spoke with husband Dellis Filbert point of contact. Questions answered, update and support given.

## 2019-01-14 NOTE — Progress Notes (Signed)
[name]  Amanda Wiley  GQB:[id number] DOB: October 27, 1955 DOA: 01/12/2019 PCP: Einar Pheasant, MD    Brief Narrative:  81CM with a history of COPD, DM 2, HTN, HLD, hepatomegaly, and obesity who presented to the ED 4 days after a positive COVID test in the outpatient setting with complaints of shortness of breath.  She was found to be hypoxic at 86% on room air with a chest x-ray noting bibasilar infiltrates.  Significant Events: 10/5 admit to [facility] via [facility] ED  COVID-19 specific Treatment: Remdesivir 10/5 > Decadron 10/5 > Convalescent plasma 10/6  Subjective: Requiring support of oxygen at 5 L with saturations in the mid 80s to 90s.  The patient feels that she is getting better.  She denies chest pain nausea vomiting or abdominal pain.  Assessment & Plan:  COVID pneumonia - acute hypoxic respiratory failure Oxygen requirement appears to be improving -clinically the patient does appear to be stabilizing already - continue current treatment and follow closely  Recent Labs  Lab 01/12/19 1400 01/13/19 0422 01/14/19 0448  DDIMER  --  0.36 0.40  FERRITIN 153  --   --   CRP 4.3* 6.5* 3.5*  ALT 50* 36* 50  PROCALCITON <0.10  --   --     DM 2 A1c 7.3 -CBG remains quite variable -adjust treatment and follow -adding linagliptin as there is some recent evidence suggesting this may in fact also provide a benefit in patients with COVID  COPD Well compensated at present  HTN Well-controlled at this time -monitor  Hepatic steatosis / hepatomegaly Noted on recent right upper quadrant ultrasound (12/22/2018) - weight loss advised -will need follow-up as outpatient  Thrombocytopenia Platelet count slowly improving  History of polycythemia vera Baseline hemoglobin 14.5-15.7 -hemoglobin stable at this time  Morbid obesity - Estimated body mass index is 34.5 kg/m as calculated from the following:   Height as of this encounter: 5' 5"  (1.651 m).  Weight as of this encounter: 94 kg.   DVT prophylaxis: Lovenox Code Status: FULL CODE Family Communication:  Disposition Plan:   Consultants:  none  Antimicrobials:  None  Objective: Blood pressure (!) 154/86, pulse 90, temperature 97.8 F (36.6 C), temperature source Oral, resp. rate (!) 27, height 5' 5"  (1.651 m), weight 94 kg, SpO2 (!) 86 %.  Intake/Output Summary (Last 24 hours) at 01/14/2019 0800 Last data filed at 01/14/2019 0500 Gross per 24 hour  Intake 1654.47 ml  Output -  Net 1654.47 ml   Filed Weights   01/12/19 2139 01/13/19 0327 01/14/19 0500  Weight: 95.3 kg 94.8 kg 94 kg    Examination: General: No acute respiratory distress Lungs: Mild crackles bilateral bases Cardiovascular: Regular rate and rhythm without murmur gallop or rub normal S1 and S2 Abdomen: Nontender, nondistended, soft, bowel sounds positive, no rebound, no ascites, no appreciable mass Extremities: No significant cyanosis, clubbing, or edema bilateral lower extremities  CBC: Recent Labs  Lab 01/12/19 1400 01/13/19 0422 01/14/19 0448  WBC 6.2 4.3 4.8  NEUTROABS 5.3 3.2 3.8  HGB 15.3* 14.5 15.2*  HCT 46.0 45.3 47.3*  MCV 87.0 89.5 90.4  PLT 137* 138* 034*   Basic Metabolic Panel: Recent Labs  Lab 01/12/19 1400 01/13/19 0422 01/14/19 0448  NA 133* 140 141  K 4.0 4.4 4.7  CL 101 106 103  CO2 20* 22 22  GLUCOSE 249* 252* 249*  BUN 21 26* 26*  CREATININE 0.79 0.89 0.92  CALCIUM 8.1* 8.1* 8.8*  GFR: Estimated Creatinine Clearance: 70.9 mL/min (by C-G formula based on SCr of 0.92 mg/dL).  Liver Function Tests: Recent Labs  Lab 01/12/19 1400 01/13/19 0422 01/14/19 0448  AST 43* 27 30  ALT 50* 45* 42  ALKPHOS 49 45 45  BILITOT 0.8 0.6 0.8  PROT 7.1 6.6 7.2  ALBUMIN 3.4* 3.1* 3.4*    HbA1C: Hemoglobin A1C  Date/Time Value Ref Range Status  02/20/2018 08:25 AM 6.7 (A) 4.0 - 5.6 % Final   Hgb A1c MFr Bld  Date/Time Value Ref Range Status  01/13/2019 04:22 AM 7.3  (H) 4.8 - 5.6 % Final    Comment:    (NOTE) Pre diabetes:          5.7%-6.4% Diabetes:              >6.4% Glycemic control for   <7.0% adults with diabetes   02/01/2017 08:01 AM 6.9 (H) 4.6 - 6.5 % Final    Comment:    Glycemic Control Guidelines for People with Diabetes:Non Diabetic:  <6%Goal of Therapy: <7%Additional Action Suggested:  >8%     CBG: Recent Labs  Lab 01/12/19 2124 01/13/19 0739 01/13/19 1111 01/13/19 1627 01/13/19 2147  GLUCAP 278* 187* 198* 303* 230*    Recent Results (from the past 240 hour(s))  Novel Coronavirus, NAA ([facility])     Status: Abnormal   Collection Time: 01/08/19 12:00 AM   Specimen: Oropharyngeal(OP) collection in vial transport medium   OROPHARYNGEA  TESTING  Result Value Ref Range Status   SARS-CoV-2, NAA Detected (A) Not Detected Final    Comment: This nucleic acid amplification test was developed and its performance characteristics determined by [employer]. Nucleic acid amplification tests include PCR and TMA. This test has not been FDA cleared or approved. This test has been authorized by FDA under an Emergency Use Authorization (EUA). This test is only authorized for the duration of time the declaration that circumstances exist justifying the authorization of the emergency use of in vitro diagnostic tests for detection of SARS-CoV-2 virus and/or diagnosis of COVID-19 infection under section 564(b)(1) of the Act, 21 U.S.C. 756EPP-2(R) (1), unless the authorization is terminated or revoked sooner. When diagnostic testing is negative, the possibility of a false negative result should be considered in the context of a patient's recent exposures and the presence of clinical signs and symptoms consistent with COVID-19. An individual without symptoms of COVID-19 and who is not shedding SARS-CoV-2 virus would  expect to have a negative (not detected) result in this assay.   Blood Culture (routine x 2)     Status: None  (Preliminary result)   Collection Time: 01/12/19  2:00 PM   Specimen: BLOOD  Result Value Ref Range Status   Specimen Description BLOOD BLOOD RIGHT HAND  Final   Special Requests   Final    BOTTLES DRAWN AEROBIC AND ANAEROBIC Blood Culture results may not be optimal due to an inadequate volume of blood received in culture bottles   Culture   Final    NO GROWTH < 24 HOURS Performed at [facility], [address], [place], [place] [address]    Report Status PENDING  Incomplete  Blood Culture (routine x 2)     Status: None (Preliminary result)   Collection Time: 01/12/19  2:00 PM   Specimen: BLOOD  Result Value Ref Range Status   Specimen Description BLOOD RIGHT ANTECUBITAL  Final   Special Requests   Final    BOTTLES DRAWN AEROBIC AND ANAEROBIC  Blood Culture adequate volume   Culture   Final    NO GROWTH < 24 HOURS Performed at [facility], [address] [place], [place] [address]    Report Status PENDING  Incomplete     Scheduled Meds: . albuterol  2 puff Inhalation Q6H  . benzonatate  100 mg Oral TID  . buPROPion  300 mg Oral Daily  . dexamethasone (DECADRON) injection  6 mg Intravenous Q12H  . enoxaparin (LOVENOX) injection  50 mg Subcutaneous QHS  . feeding supplement (ENSURE ENLIVE)  237 mL Oral BID BM  . insulin aspart  0-20 Units Subcutaneous TID WC  . insulin aspart  0-5 Units Subcutaneous QHS  . mouth rinse  15 mL Mouth Rinse BID  . sodium chloride flush  3 mL Intravenous Q12H  . vitamin C  500 mg Oral Daily  . zinc sulfate  220 mg Oral Daily   Continuous Infusions: . sodium chloride    . remdesivir 100 mg in NS 250 mL 100 mg (01/13/19 1544)     LOS: 2 days   Cherene Altes, MD [facility] Office  [phone] Pager - Text Page per Amion  If 7PM-7AM, please contact night-coverage per Amion 01/14/2019, 8:00 AM

## 2019-01-14 NOTE — Progress Notes (Signed)
   Vital Signs MEWS/VS Documentation      01/13/2019 2003 01/14/2019 0407 01/14/2019 0723 01/14/2019 0755   MEWS Score:  1  1  2  1    MEWS Score Color:  Nyoka Cowden  Green  Yellow  Green   Resp:  -  (!) 21  (!) 27  (!) 21   Pulse:  -  68  90  98   BP:  -  129/65  (!) 154/86  -   Temp:  -  98 F (36.7 C)  97.8 F (36.6 C)  -   O2 Device:  -  Nasal Cannula  Nasal Cannula  Nasal Cannula   O2 Flow Rate (L/min):  -  4 L/min  5 L/min  5 L/min   Level of Consciousness:  Alert  Alert  -  -       At time of vitals 0723 pt had just awoke from coughing fit and O2 saturation was slowly recovering. Patient ambulated to chair at that time.  Per report pt had been on 5L overnight.   Candace Cruise K 01/14/2019,8:40 AM

## 2019-01-14 NOTE — Plan of Care (Signed)
Patient remains anxious about condition and subjective feeling of shortness of breath. Respiratory status stable and improving. Comfort and encouragement provided.  Problem: Education: Goal: Knowledge of risk factors and measures for prevention of condition will improve Outcome: Progressing   Problem: Coping: Goal: Psychosocial and spiritual needs will be supported Outcome: Progressing   Problem: Respiratory: Goal: Will maintain a patent airway Outcome: Progressing Goal: Complications related to the disease process, condition or treatment will be avoided or minimized Outcome: Progressing

## 2019-01-15 ENCOUNTER — Inpatient Hospital Stay (HOSPITAL_COMMUNITY)

## 2019-01-15 LAB — COMPREHENSIVE METABOLIC PANEL
ALT: 33 U/L (ref 0–44)
AST: 21 U/L (ref 15–41)
Albumin: 3.1 g/dL — ABNORMAL LOW (ref 3.5–5.0)
Alkaline Phosphatase: 42 U/L (ref 38–126)
Anion gap: 15 (ref 5–15)
BUN: 29 mg/dL — ABNORMAL HIGH (ref 8–23)
CO2: 23 mmol/L (ref 22–32)
Calcium: 8.5 mg/dL — ABNORMAL LOW (ref 8.9–10.3)
Chloride: 102 mmol/L (ref 98–111)
Creatinine, Ser: 0.79 mg/dL (ref 0.44–1.00)
GFR calc Af Amer: >60 mL/min (ref >60)
GFR calc non Af Amer: >60 mL/min (ref >60)
Glucose, Bld: 170 mg/dL — ABNORMAL HIGH (ref 70–99)
Potassium: 4 mmol/L (ref 3.5–5.1)
Sodium: 140 mmol/L (ref 135–145)
Total Bilirubin: 0.5 mg/dL (ref 0.3–1.2)
Total Protein: 6.6 g/dL (ref 6.5–8.1)

## 2019-01-15 LAB — GLUCOSE, CAPILLARY
Glucose-Capillary: 231 mg/dL — ABNORMAL HIGH (ref 70–99)
Glucose-Capillary: 172 mg/dL — ABNORMAL HIGH (ref 70–99)
Glucose-Capillary: 233 mg/dL — ABNORMAL HIGH (ref 70–99)
Glucose-Capillary: 366 mg/dL — ABNORMAL HIGH (ref 70–99)
Glucose-Capillary: 203 mg/dL — ABNORMAL HIGH (ref 70–99)

## 2019-01-15 LAB — C-REACTIVE PROTEIN: CRP: 1.7 mg/dL — ABNORMAL HIGH (ref <1.0)

## 2019-01-15 LAB — CBC WITH DIFFERENTIAL/PLATELET
Abs Immature Granulocytes: 0.19 10*3/uL — ABNORMAL HIGH (ref 0.00–0.07)
Basophils Absolute: 0 10*3/uL (ref 0.0–0.1)
Basophils Relative: 1 %
Eosinophils Absolute: 0.1 10*3/uL (ref 0.0–0.5)
Eosinophils Relative: 1 %
HCT: 46 % (ref 36.0–46.0)
Hemoglobin: 14.7 g/dL (ref 12.0–15.0)
Immature Granulocytes: 2 %
Lymphocytes Relative: 14 %
Lymphs Abs: 1.2 10*3/uL (ref 0.7–4.0)
MCH: 28.8 pg (ref 26.0–34.0)
MCHC: 32 g/dL (ref 30.0–36.0)
MCV: 90 fL (ref 80.0–100.0)
Monocytes Absolute: 0.8 10*3/uL (ref 0.1–1.0)
Monocytes Relative: 9 %
Neutro Abs: 5.7 10*3/uL (ref 1.7–7.7)
Neutrophils Relative %: 73 %
Platelets: 208 10*3/uL (ref 150–400)
RBC: 5.11 MIL/uL (ref 3.87–5.11)
RDW: 14.7 % (ref 11.5–15.5)
WBC: 8 10*3/uL (ref 4.0–10.5)
nRBC: 0 % (ref 0.0–0.2)

## 2019-01-15 LAB — FERRITIN: Ferritin: 121 ng/mL (ref 11–307)

## 2019-01-15 LAB — D-DIMER, QUANTITATIVE: D-Dimer, Quant: 0.48 ug/mL-FEU (ref 0.00–0.50)

## 2019-01-15 MED ORDER — FUROSEMIDE 10 MG/ML IJ SOLN
40.0000 mg | Freq: Once | INTRAMUSCULAR | Status: AC
  Administered 2019-01-15: 40 mg via INTRAVENOUS
  Filled 2019-01-15: qty 4

## 2019-01-15 NOTE — Evaluation (Signed)
Physical Therapy Evaluation Patient Details Name: Amanda Wiley MRN: [id number] DOB: Jan 28, 1956 Today's Date: 01/15/2019   History of Present Illness  63yo with a history of COPD, DM 2, HTN, HLD, hepatomegaly, and obesity who presented to the ED 4 days after a positive COVID test in the outpatient setting with complaints of shortness of breath.  She was found to be hypoxic at 86% on room air with a chest x-ray noting bibasilar infiltrates.  Clinical Impression  The  Patient is very pleasant, ambulating in room on 3 L [place] . Patient will benefit from increased ambulation and monitoring of SPO2 to determine supplemental O2 at Dc. Pt admitted with above diagnosis.   Pt currently with functional limitations due to the deficits listed below (see PT Problem List). Pt will benefit from skilled PT to increase their independence and safety with mobility to allow discharge to the venue listed below.       Follow Up Recommendations No PT follow up    Equipment Recommendations  None recommended by PT(may require  O2)    Recommendations for Other Services       Precautions / Restrictions Precautions Precautions: Fall Precaution Comments: monitor O2      Mobility  Bed Mobility Overal bed mobility: Independent Bed Mobility: Sit to Supine       Sit to supine: Independent      Transfers Overall transfer level: Modified independent                  Ambulation/Gait Ambulation/Gait assistance: Modified independent (Device/Increase time) Gait Distance (Feet): 20 Feet(then 50 in room) Assistive device: None Gait Pattern/deviations: WFL(Within Functional Limits)   Gait velocity interpretation: <1.31 ft/sec, indicative of household ambulator General Gait Details: pt took care to not trip on tubing. Pt. on 3 L SPO2 93%  Stairs            Wheelchair Mobility    Modified Rankin (Stroke Patients Only)       Balance                                              Pertinent Vitals/Pain Pain Assessment: No/denies pain    Home Living Family/patient expects to be discharged to:: Private residence Living Arrangements: Spouse/significant other Available Help at Discharge: Family;Available 24 hours/day Type of Home: House Home Access: Stairs to enter   [employer] of Steps: 2 on front, 1 onto deck at back, then 1 into backdoor Home Layout: One level Home Equipment: Shower seat - built in;Grab bars - tub/shower;Cane - single point;Bedside commode;Walker - 2 wheels      Prior Function Level of Independence: Independent         Comments: dirves, retired, performing iADL     Journalist, newspaper        Extremity/Trunk Assessment   Upper Extremity Assessment Upper Extremity Assessment: Generalized weakness    Lower Extremity Assessment Lower Extremity Assessment: Generalized weakness    Cervical / Trunk Assessment Cervical / Trunk Assessment: Normal  Communication   Communication: No difficulties  Cognition Arousal/Alertness: Awake/alert Behavior During Therapy: WFL for tasks assessed/performed Overall Cognitive Status: Within Functional Limits for tasks assessed                                 General Comments: very pleasant  General Comments      Exercises     Assessment/Plan    PT Assessment Patient needs continued PT services  PT Problem List Decreased activity tolerance;Cardiopulmonary status limiting activity       PT Treatment Interventions Gait training;Functional mobility training;Therapeutic activities;Therapeutic exercise    PT Goals (Current goals can be found in the Care Plan section)  Acute Rehab PT Goals Patient Stated Goal: return home when ready, no more panic attacks PT Goal Formulation: With patient Time For Goal Achievement: 01/29/19 Potential to Achieve Goals: Good    Frequency Min 3X/week   Barriers to discharge        Co-evaluation                AM-PAC PT "6 Clicks" Mobility  Outcome Measure Help needed turning from your back to your side while in a flat bed without using bedrails?: None Help needed moving from lying on your back to sitting on the side of a flat bed without using bedrails?: None Help needed moving to and from a bed to a chair (including a wheelchair)?: None Help needed standing up from a chair using your arms (e.g., wheelchair or bedside chair)?: None Help needed to walk in hospital room?: None Help needed climbing 3-5 steps with a railing? : A Little 6 Click Score: 23    End of Session Equipment Utilized During Treatment: Oxygen Activity Tolerance: Patient tolerated treatment well Patient left: in bed;with call bell/phone within reach Nurse Communication: Mobility status PT Visit Diagnosis: Difficulty in walking, not elsewhere classified (R26.2)    Time: 0998-3382 PT Time Calculation (min) (ACUTE ONLY): 12 min   Charges:   PT Evaluation $PT Eval Low Complexity: [place] Pager [phone] Office [phone]   Claretha Cooper 01/15/2019, 1:36 PM

## 2019-01-15 NOTE — Progress Notes (Addendum)
Occupational Therapy Evaluation Patient Details Name: Amanda Wiley MRN: [id number] DOB: November 12, 1955 Today's Date: 01/15/2019    History of Present Illness 63yo with a history of COPD, DM 2, HTN, HLD, hepatomegaly, and obesity who presented to the ED 4 days after a positive COVID test in the outpatient setting with complaints of shortness of breath.  She was found to be hypoxic at 86% on room air with a chest x-ray noting bibasilar infiltrates.   Clinical Impression   [name] presents with the above. PTA pt reports independent with ADL, iADL and functional mobility. Pt currently limited to increased WOB with activity, decreased activity tolerance. Pt with coughing spells during session which appear to be large attribute to decreased O2 sats. Pt on 5L throughout session, with coughing spell after room level mobility SpO2 dropped briefly to 78% (decreasing to low 80s with following coughing spells), each time O2 sats rebound within approx 1-2 min to 90% or greater. Pt demonstrating room level mobility without AD, toileting and standing grooming ADL at supervision level today. Reports plans to return home with spouse assist. She will benefit from continued acute OT services and currently recommend [place] services after discharge to progress pt towards her PLOF. Will follow.     Follow Up Recommendations  Supervision/Assistance - 24 hour;Home health OT;No OT follow up(HH vs none, pending progress)    Equipment Recommendations  None recommended by OT(pt's DME needs are met)           Precautions / Restrictions Precautions Precautions: Fall(low fall risk) Precaution Comments: monitor O2 Restrictions Weight Bearing Restrictions: No      Mobility Bed Mobility               General bed mobility comments: received OOB in recliner  Transfers Overall transfer level: Modified independent Equipment used: None                  Balance Overall balance assessment: No  apparent balance deficits (not formally assessed)                                         ADL either performed or assessed with clinical judgement   ADL Overall ADL's : Needs assistance/impaired Eating/Feeding: Independent;Sitting   Grooming: Supervision/safety;Standing;Wash/dry hands   Upper Body Bathing: Modified independent;Sitting   Lower Body Bathing: Supervison/ safety;Min guard;Sit to/from stand   Upper Body Dressing : Modified independent;Sitting   Lower Body Dressing: Min guard;Sit to/from stand   Toilet Transfer: Supervision/safety;Ambulation;Regular Toilet   Toileting- Water quality scientist and Hygiene: Supervision/safety;Sit to/from stand;Sitting/lateral lean Toileting - Clothing Manipulation Details (indicate cue type and reason): pt performing peri-care/gown management at distant supervision level     Functional mobility during ADLs: Supervision/safety General ADL Comments: pt with coughing spells causing decreased O2 sats, rebounds quickly (within 1-2 min); issued EC handout and initiated education/review - will benefit from further review/practice                         Pertinent Vitals/Pain Pain Assessment: Faces Faces Pain Scale: Hurts little more Pain Location: headhache at end of session Pain Descriptors / Indicators: Headache Pain Intervention(s): Monitored during session;Limited activity within patient's tolerance;Patient requesting pain meds-RN notified     Hand Dominance     Extremity/Trunk Assessment Upper Extremity Assessment Upper Extremity Assessment: Generalized weakness   Lower Extremity Assessment Lower Extremity  Assessment: Defer to PT evaluation       Communication Communication Communication: No difficulties   Cognition Arousal/Alertness: Awake/alert Behavior During Therapy: WFL for tasks assessed/performed Overall Cognitive Status: Within Functional Limits for tasks assessed                                  General Comments: very pleasant   General Comments       Exercises     Shoulder Instructions      Home Living Family/patient expects to be discharged to:: Private residence Living Arrangements: Spouse/significant other Available Help at Discharge: Family;Available 24 hours/day Type of Home: House Home Access: Stairs to enter Entrance Stairs-Number of Steps: 2 on front, 1 onto deck at back, then 1 into backdoor   Home Layout: One level     Bathroom Shower/Tub: Walk-in shower   Bathroom Toilet: Standard     Home Equipment: Shower seat - built in;Grab bars - tub/shower;Cane - single point;Bedside commode;Walker - 2 wheels          Prior Functioning/Environment Level of Independence: Independent        Comments: dirves, retired, performing iADL        OT Problem List: Decreased strength;Decreased activity tolerance;Obesity;Cardiopulmonary status limiting activity;Decreased knowledge of use of DME or AE      OT Treatment/Interventions: Self-care/ADL training;Neuromuscular education;DME and/or AE instruction;Therapeutic activities;Patient/family education;Balance training;Energy conservation;Therapeutic exercise    OT Goals(Current goals can be found in the care plan section) Acute Rehab OT Goals Patient Stated Goal: return home when ready, no more panic attacks OT Goal Formulation: With patient Time For Goal Achievement: 01/29/19 Potential to Achieve Goals: Good  OT Frequency: Min 3X/week   Barriers to D/C:            Co-evaluation              AM-PAC OT "6 Clicks" Daily Activity     Outcome Measure Help from another person eating meals?: None Help from another person taking care of personal grooming?: None Help from another person toileting, which includes using toliet, bedpan, or urinal?: None Help from another person bathing (including washing, rinsing, drying)?: A Little Help from another person to put on and taking off regular  upper body clothing?: None Help from another person to put on and taking off regular lower body clothing?: A Little 6 Click Score: 22   End of Session Equipment Utilized During Treatment: Oxygen(5L) Nurse Communication: Mobility status  Activity Tolerance: Patient tolerated treatment well;Patient limited by fatigue Patient left: in chair;with call bell/phone within reach  OT Visit Diagnosis: Muscle weakness (generalized) (M62.81);Other (comment)(decreased activity tolerance)                Time: 0917-0945 OT Time Calculation (min): 28 min Charges:  OT General Charges $OT Visit: 1 Visit OT Evaluation $OT Eval Moderate Complexity: 1 Mod OT Treatments $Self Care/Home Management : 8-22 mins   , OT [facility] Pager [phone] Office [phone]    L  01/15/2019, 12:48 PM 

## 2019-01-15 NOTE — Progress Notes (Addendum)
[place] TEAM 1 - Stepdown/ICU TEAM  ARLEENE SETTLE  HCW:[id number] DOB: [birth year] DOA: 01/12/2019 PCP: Einar Pheasant, MD    Brief Narrative:  16OH with a history of COPD, DM 2, HTN, HLD, hepatomegaly, and obesity who presented to the ED 4 days after a positive COVID test in the outpatient setting with complaints of shortness of breath.  She was found to be hypoxic at 86% on room air with a chest x-ray noting bibasilar infiltrates.  Significant Events: 10/1 SARS-CoV-2 test positive 10/5 admit to [facility] via [facility] ED  COVID-19 specific Treatment: Remdesivir 10/5 > Decadron 10/5 > Convalescent plasma 10/6  Subjective: Oxygen saturations improving with patient only requiring 2 L nasal cannula at this time to keep sats in the mid 80s to low 90s.  The patient feels much better overall.  She denies chest pain nausea vomiting or abdominal pain.  Her chest x-ray today reveals progressive bilateral infiltrates.  Assessment & Plan:  COVID pneumonia - acute hypoxic respiratory failure Oxygen requirement appears to be improving -clinically the patient does appear to be stabilizing already - continue current treatment and follow closely  Recent Labs  Lab 01/12/19 1400 01/13/19 0422 01/14/19 0448 01/15/19 0230  DDIMER  --  0.36 0.40 0.48  FERRITIN 153  --   --   --   CRP 4.3* 6.5* 3.5*  --   ALT 50* 71* 42 33  PROCALCITON <0.10  --   --   --     DM 2 A1c 7.3 - added linagliptin as there is some recent evidence suggesting this may in fact also provide a benefit in patients with COVID - CBG improving - follow without change today  COPD Well compensated at present  HTN Well-controlled at this time - monitor  Hepatic steatosis / hepatomegaly Noted on recent right upper quadrant ultrasound (12/22/2018) - weight loss advised -will need follow-up as outpatient  Thrombocytopenia Platelet count slowly improving  History of polycythemia vera Baseline hemoglobin 14.5-15.7  -hemoglobin stable at this time  Morbid obesity - Estimated body mass index is 34.85 kg/m as calculated from the following:   Height as of this encounter: 5' 5"  (1.651 m).   Weight as of this encounter: 95 kg.   DVT prophylaxis: Lovenox Code Status: FULL CODE Family Communication:  Disposition Plan: Possible discharge home 10/9  Consultants:  none  Antimicrobials:  None  Objective: Blood pressure (!) 109/54, pulse 61, temperature 98.3 F (36.8 C), temperature source Oral, resp. rate 20, height 5' 5"  (1.651 m), weight 95 kg, SpO2 91 %.  Intake/Output Summary (Last 24 hours) at 01/15/2019 0755 Last data filed at 01/14/2019 1800 Gross per 24 hour  Intake 972.36 ml  Output -  Net 972.36 ml   Filed Weights   01/13/19 0327 01/14/19 0500 01/15/19 0500  Weight: 94.8 kg 94 kg 95 kg    Examination: General: No acute respiratory distress Lungs: Fine scattered crackles bilaterally without wheezing Cardiovascular: Regular rate and rhythm without murmur  Abdomen: NT/ND, soft, BS positive, no rebound Extremities: No C/C/E bilateral lower extremities  CBC: Recent Labs  Lab 01/13/19 0422 01/14/19 0448 01/15/19 0230  WBC 4.3 4.8 8.0  NEUTROABS 3.2 3.8 5.7  HGB 14.5 15.2* 14.7  HCT 45.3 47.3* 46.0  MCV 89.5 90.4 90.0  PLT 138* 141* 607   Basic Metabolic Panel: Recent Labs  Lab 01/13/19 0422 01/14/19 0448 01/15/19 0230  NA 140 141 140  K 4.4 4.7 4.0  CL 106 103 102  CO2  22 22 23   GLUCOSE 252* 249* 170*  BUN 26* 26* 29*  CREATININE 0.89 0.92 0.79  CALCIUM 8.1* 8.8* 8.5*   GFR: Estimated Creatinine Clearance: 82 mL/min (by C-G formula based on SCr of 0.79 mg/dL).  Liver Function Tests: Recent Labs  Lab 01/12/19 1400 01/13/19 0422 01/14/19 0448 01/15/19 0230  AST 43* 27 30 21   ALT 50* 45* 42 33  ALKPHOS 49 45 45 42  BILITOT 0.8 0.6 0.8 0.5  PROT 7.1 6.6 7.2 6.6  ALBUMIN 3.4* 3.1* 3.4* 3.1*    HbA1C: Hemoglobin A1C  Date/Time Value Ref Range Status   02/20/2018 08:25 AM 6.7 (A) 4.0 - 5.6 % Final   Hgb A1c MFr Bld  Date/Time Value Ref Range Status  01/13/2019 04:22 AM 7.3 (H) 4.8 - 5.6 % Final    Comment:    (NOTE) Pre diabetes:          5.7%-6.4% Diabetes:              >6.4% Glycemic control for   <7.0% adults with diabetes   02/01/2017 08:01 AM 6.9 (H) 4.6 - 6.5 % Final    Comment:    Glycemic Control Guidelines for People with Diabetes:Non Diabetic:  <6%Goal of Therapy: <7%Additional Action Suggested:  >8%     CBG: Recent Labs  Lab 01/13/19 2147 01/14/19 0749 01/14/19 1136 01/14/19 1616 01/14/19 2054  GLUCAP 230* 233* 226* 270* 231*    Recent Results (from the past 240 hour(s))  Novel Coronavirus, NAA ([facility])     Status: Abnormal   Collection Time: 01/08/19 12:00 AM   Specimen: Oropharyngeal(OP) collection in vial transport medium   OROPHARYNGEA  TESTING  Result Value Ref Range Status   SARS-CoV-2, NAA Detected (A) Not Detected Final    Comment: This nucleic acid amplification test was developed and its performance characteristics determined by [employer]. Nucleic acid amplification tests include PCR and TMA. This test has not been FDA cleared or approved. This test has been authorized by FDA under an Emergency Use Authorization (EUA). This test is only authorized for the duration of time the declaration that circumstances exist justifying the authorization of the emergency use of in vitro diagnostic tests for detection of SARS-CoV-2 virus and/or diagnosis of COVID-19 infection under section 564(b)(1) of the Act, 21 U.S.C. 408XKG-8(J) (1), unless the authorization is terminated or revoked sooner. When diagnostic testing is negative, the possibility of a false negative result should be considered in the context of a patient's recent exposures and the presence of clinical signs and symptoms consistent with COVID-19. An individual without symptoms of COVID-19 and who is not shedding SARS-CoV-2 virus  would  expect to have a negative (not detected) result in this assay.   Blood Culture (routine x 2)     Status: None (Preliminary result)   Collection Time: 01/12/19  2:00 PM   Specimen: BLOOD  Result Value Ref Range Status   Specimen Description BLOOD BLOOD RIGHT HAND  Final   Special Requests   Final    BOTTLES DRAWN AEROBIC AND ANAEROBIC Blood Culture results may not be optimal due to an inadequate volume of blood received in culture bottles   Culture   Final    NO GROWTH 2 DAYS Performed at [facility], [address] [place], [place] [address]    Report Status PENDING  Incomplete  Blood Culture (routine x 2)     Status: None (Preliminary result)   Collection Time: 01/12/19  2:00 PM  Specimen: BLOOD  Result Value Ref Range Status   Specimen Description BLOOD RIGHT ANTECUBITAL  Final   Special Requests   Final    BOTTLES DRAWN AEROBIC AND ANAEROBIC Blood Culture adequate volume   Culture   Final    NO GROWTH 2 DAYS Performed at [facility], [address] [place], [place] [address]    Report Status PENDING  Incomplete     Scheduled Meds: . amLODipine  5 mg Oral Daily  . benzonatate  100 mg Oral TID  . buPROPion  300 mg Oral Daily  . dexamethasone (DECADRON) injection  6 mg Intravenous Q24H  . enoxaparin (LOVENOX) injection  50 mg Subcutaneous QHS  . feeding supplement (ENSURE ENLIVE)  237 mL Oral BID BM  . fluticasone  2 spray Each Nare Daily  . fluticasone furoate-vilanterol  1 puff Inhalation Daily  . insulin aspart  0-20 Units Subcutaneous TID WC  . insulin aspart  0-5 Units Subcutaneous QHS  . insulin glargine  16 Units Subcutaneous Daily  . linagliptin  5 mg Oral Daily  . losartan  100 mg Oral Daily  . mouth rinse  15 mL Mouth Rinse BID  . metoprolol succinate  50 mg Oral Daily  . montelukast  10 mg Oral Daily  . pantoprazole  40 mg Oral Daily  . sodium chloride flush  3 mL Intravenous Q12H  . umeclidinium bromide  1 puff Inhalation  Daily  . vitamin C  500 mg Oral Daily  . zinc sulfate  220 mg Oral Daily     LOS: 3 days   Cherene Altes, MD [facility] Office  [phone] Pager - Text Page per Amion  If 7PM-7AM, please contact night-coverage per Amion 01/15/2019, 7:55 AM

## 2019-01-15 NOTE — Progress Notes (Signed)
Inpatient Diabetes Program Recommendations  AACE/ADA: New Consensus Statement on Inpatient Glycemic Control  Target Ranges:  Prepandial:   less than 140 mg/dL      Peak postprandial:   less than 180 mg/dL (1-2 hours)      Critically ill patients:  140 - 180 mg/dL   Results for JUSTIS, DUPAS (MRN [id number]) as of 01/15/2019 10:36  Ref. Range 01/14/2019 07:49 01/14/2019 11:36 01/14/2019 16:16 01/14/2019 20:54 01/15/2019 08:03  Glucose-Capillary Latest Ref Range: 70 - 99 mg/dL 233 (H) 226 (H) 270 (H) 231 (H) 172 (H)   Review of Glycemic Control  Current orders for Inpatient glycemic control: Lantus 16 units daily, Novolog 0-20 units TID with meals, Novolog 0-5 units QHS, Tradjenta 5 mg daily; Decadron 6 mg Q24H  Inpatient Diabetes Program Recommendations:    Insulin-Meal Coverage: If steroids are continued, please consider ordering Novolog 5 units TID with meals for meal coverage if patient eats at least 50% of meals.  Thanks, Barnie Alderman, RN, MSN, CDE Diabetes Coordinator Inpatient Diabetes Program [phone] (Team Pager from 8am to 5pm)

## 2019-01-16 LAB — CBC WITH DIFFERENTIAL/PLATELET
Abs Immature Granulocytes: 0.45 10*3/uL — ABNORMAL HIGH (ref 0.00–0.07)
Basophils Absolute: 0.1 10*3/uL (ref 0.0–0.1)
Basophils Relative: 1 %
Eosinophils Absolute: 0 10*3/uL (ref 0.0–0.5)
Eosinophils Relative: 0 %
HCT: 49.4 % — ABNORMAL HIGH (ref 36.0–46.0)
Hemoglobin: 15.9 g/dL — ABNORMAL HIGH (ref 12.0–15.0)
Immature Granulocytes: 4 %
Lymphocytes Relative: 17 %
Lymphs Abs: 1.9 10*3/uL (ref 0.7–4.0)
MCH: 28.6 pg (ref 26.0–34.0)
MCHC: 32.2 g/dL (ref 30.0–36.0)
MCV: 89 fL (ref 80.0–100.0)
Monocytes Absolute: 1.2 10*3/uL — ABNORMAL HIGH (ref 0.1–1.0)
Monocytes Relative: 11 %
Neutro Abs: 7.7 10*3/uL (ref 1.7–7.7)
Neutrophils Relative %: 67 %
Platelets: 250 10*3/uL (ref 150–400)
RBC: 5.55 MIL/uL — ABNORMAL HIGH (ref 3.87–5.11)
RDW: 14.5 % (ref 11.5–15.5)
WBC: 11.4 10*3/uL — ABNORMAL HIGH (ref 4.0–10.5)
nRBC: 0 % (ref 0.0–0.2)

## 2019-01-16 LAB — COMPREHENSIVE METABOLIC PANEL
ALT: 47 U/L — ABNORMAL HIGH (ref 0–44)
AST: 34 U/L (ref 15–41)
Albumin: 3.3 g/dL — ABNORMAL LOW (ref 3.5–5.0)
Alkaline Phosphatase: 48 U/L (ref 38–126)
Anion gap: 16 — ABNORMAL HIGH (ref 5–15)
BUN: 31 mg/dL — ABNORMAL HIGH (ref 8–23)
CO2: 23 mmol/L (ref 22–32)
Calcium: 8.7 mg/dL — ABNORMAL LOW (ref 8.9–10.3)
Chloride: 101 mmol/L (ref 98–111)
Creatinine, Ser: 0.98 mg/dL (ref 0.44–1.00)
GFR calc Af Amer: >60 mL/min (ref >60)
GFR calc non Af Amer: >60 mL/min (ref >60)
Glucose, Bld: 159 mg/dL — ABNORMAL HIGH (ref 70–99)
Potassium: 3.7 mmol/L (ref 3.5–5.1)
Sodium: 140 mmol/L (ref 135–145)
Total Bilirubin: 0.9 mg/dL (ref 0.3–1.2)
Total Protein: 7.1 g/dL (ref 6.5–8.1)

## 2019-01-16 LAB — D-DIMER, QUANTITATIVE: D-Dimer, Quant: 0.38 ug/mL-FEU (ref 0.00–0.50)

## 2019-01-16 LAB — GLUCOSE, CAPILLARY
Glucose-Capillary: 152 mg/dL — ABNORMAL HIGH (ref 70–99)
Glucose-Capillary: 265 mg/dL — ABNORMAL HIGH (ref 70–99)
Glucose-Capillary: 364 mg/dL — ABNORMAL HIGH (ref 70–99)
Glucose-Capillary: 205 mg/dL — ABNORMAL HIGH (ref 70–99)

## 2019-01-16 LAB — C-REACTIVE PROTEIN: CRP: 3.2 mg/dL — ABNORMAL HIGH (ref <1.0)

## 2019-01-16 MED ORDER — DEXAMETHASONE 6 MG PO TABS
6.0000 mg | ORAL_TABLET | Freq: Every day | ORAL | Status: DC
  Administered 2019-01-17 – 2019-01-19 (×3): 6 mg via ORAL
  Filled 2019-01-16 (×3): qty 1

## 2019-01-16 MED ORDER — ALPRAZOLAM 0.5 MG PO TABS
0.2500 mg | ORAL_TABLET | Freq: Two times a day (BID) | ORAL | Status: DC | PRN
  Administered 2019-01-17: 0.25 mg via ORAL
  Filled 2019-01-16: qty 1

## 2019-01-16 MED ORDER — FUROSEMIDE 10 MG/ML IJ SOLN
40.0000 mg | Freq: Once | INTRAMUSCULAR | Status: AC
  Administered 2019-01-16: 40 mg via INTRAVENOUS
  Filled 2019-01-16: qty 4

## 2019-01-16 MED ORDER — INSULIN ASPART 100 UNIT/ML ~~LOC~~ SOLN
5.0000 [IU] | Freq: Three times a day (TID) | SUBCUTANEOUS | Status: DC
  Administered 2019-01-16: 5 [IU] via SUBCUTANEOUS

## 2019-01-16 NOTE — Progress Notes (Signed)
Ambulated in room and washed hair/bath. Sats dropped to 84% and came back up at rest to 94% on 4L [place]. Denies distress.

## 2019-01-16 NOTE — [place] Notes (Signed)
[name] - Stepdown/ICU TEAM  Amanda Wiley  ZOX:[id number] DOB: 1955-09-03 DOA: 01/12/2019 PCP: Amanda Pheasant, MD    Brief Narrative:  80XB with a history of COPD, DM 2, HTN, HLD, hepatomegaly, and obesity who presented to the ED 4 days after a positive COVID test in the outpatient setting with complaints of shortness of breath.  She was found to be hypoxic at 86% on room air with a chest x-ray noting bibasilar infiltrates.  Significant Events: 10/1 SARS-CoV-2 test positive 10/5 admit to [facility] via [facility] ED  COVID-19 specific Treatment: Remdesivir 10/5 > 10/9 Decadron 10/5 > Convalescent plasma 10/6  Subjective: Requiring 4 L nasal cannula support to keep saturations in the upper 80s and low 90s.  Still feels very weak and short of breath when attempting to exert herself.  Denies chest pain nausea or vomiting.  Assessment & Plan:  COVID pneumonia - acute hypoxic respiratory failure Has received convalescent plasma -finishes her Remdesivir course today -continue Decadron -wean oxygen as able -ambulate with therapy -hopeful for discharge within 24-48 hours if exertional tolerance and overall strength improved  Recent Labs  Lab 01/12/19 1400 01/13/19 0422 01/14/19 0448 01/15/19 0230 01/16/19 0130  DDIMER  --  0.36 0.40 0.48 0.38  FERRITIN 153  --   --  121  --   CRP 4.3* 6.5* 3.5* 1.7* 3.2*  ALT 50* 45* 42 33 80*  PROCALCITON <0.10  --   --   --   --     DM 2 A1c 7.3 - added linagliptin as there is some recent evidence suggesting this may in fact also provide a benefit in patients with COVID - CBG trending upward -will add meal coverage NovoLog today and follow  COPD Well compensated at present  HTN No change in treatment plan today  Hepatic steatosis / hepatomegaly Noted on recent right upper quadrant ultrasound (12/22/2018) - weight loss advised -will need follow-up as outpatient  Thrombocytopenia Platelet count has normalized   History of  polycythemia vera Baseline hemoglobin 14.5-15.7 -hemoglobin stable at this time  Morbid obesity - Estimated body mass index is 34.85 kg/m as calculated from the following:   Height as of this encounter: 5' 5"  (1.651 m).   Weight as of this encounter: 95 kg.   DVT prophylaxis: Lovenox Code Status: FULL CODE Family Communication:  Disposition Plan: Possible discharge home in 24-48 hours  Consultants:  none  Antimicrobials:  None  Objective: Blood pressure (!) 108/51, pulse 73, temperature 97.8 F (36.6 C), temperature source Oral, resp. rate (!) 21, height 5' 5"  (1.651 m), weight 95 kg, SpO2 91 %. No intake or output data in the 24 hours ending 01/16/19 0807 Filed Weights   01/13/19 0327 01/14/19 0500 01/15/19 0500  Weight: 94.8 kg 94 kg 95 kg    Examination: General: No acute respiratory distress Lungs: Scattered fine crackles diffusely bilaterally with no wheezing Cardiovascular: RRR Abdomen: NT/ND, soft, BS positive Extremities: No C/C/E B LE   CBC: Recent Labs  Lab 01/14/19 0448 01/15/19 0230 01/16/19 0130  WBC 4.8 8.0 11.4*  NEUTROABS 3.8 5.7 7.7  HGB 15.2* 14.7 15.9*  HCT 47.3* 46.0 49.4*  MCV 90.4 90.0 89.0  PLT 141* 208 147   Basic Metabolic Panel: Recent Labs  Lab 01/14/19 0448 01/15/19 0230 01/16/19 0130  NA 141 140 140  K 4.7 4.0 3.7  CL 103 102 101  CO2 22 23 23   GLUCOSE 249* 170* 159*  BUN 26* 29* 31*  CREATININE 0.92 0.79 0.98  CALCIUM 8.8* 8.5* 8.7*   GFR: Estimated Creatinine Clearance: 67 mL/min (by C-G formula based on SCr of 0.98 mg/dL).  Liver Function Tests: Recent Labs  Lab 01/13/19 0422 01/14/19 0448 01/15/19 0230 01/16/19 0130  AST 27 30 21  34  ALT 45* 42 33 47*  ALKPHOS 45 45 42 48  BILITOT 0.6 0.8 0.5 0.9  PROT 6.6 7.2 6.6 7.1  ALBUMIN 3.1* 3.4* 3.1* 3.3*    HbA1C: Hemoglobin A1C  Date/Time Value Ref Range Status  02/20/2018 08:25 AM 6.7 (A) 4.0 - 5.6 % Final   Hgb A1c MFr Bld  Date/Time Value Ref Range  Status  01/13/2019 04:22 AM 7.3 (H) 4.8 - 5.6 % Final    Comment:    (NOTE) Pre diabetes:          5.7%-6.4% Diabetes:              >6.4% Glycemic control for   <7.0% adults with diabetes   02/01/2017 08:01 AM 6.9 (H) 4.6 - 6.5 % Final    Comment:    Glycemic Control Guidelines for People with Diabetes:Non Diabetic:  <6%Goal of Therapy: <7%Additional Action Suggested:  >8%     CBG: Recent Labs  Lab 01/15/19 0803 01/15/19 1152 01/15/19 1643 01/15/19 2129 01/16/19 0749  GLUCAP 172* 233* 366* 203* 152*    Recent Results (from the past 240 hour(s))  Novel Coronavirus, NAA ([facility])     Status: Abnormal   Collection Time: 01/08/19 12:00 AM   Specimen: Oropharyngeal(OP) collection in vial transport medium   OROPHARYNGEA  TESTING  Result Value Ref Range Status   SARS-CoV-2, NAA Detected (A) Not Detected Final    Comment: This nucleic acid amplification test was developed and its performance characteristics determined by [employer]. Nucleic acid amplification tests include PCR and TMA. This test has not been FDA cleared or approved. This test has been authorized by FDA under an Emergency Use Authorization (EUA). This test is only authorized for the duration of time the declaration that circumstances exist justifying the authorization of the emergency use of in vitro diagnostic tests for detection of SARS-CoV-2 virus and/or diagnosis of COVID-19 infection under section 564(b)(1) of the Act, 21 U.S.C. 161WRU-0(A) (1), unless the authorization is terminated or revoked sooner. When diagnostic testing is negative, the possibility of a false negative result should be considered in the context of a patient's recent exposures and the presence of clinical signs and symptoms consistent with COVID-19. An individual without symptoms of COVID-19 and who is not shedding SARS-CoV-2 virus would  expect to have a negative (not detected) result in this assay.   Blood Culture  (routine x 2)     Status: None (Preliminary result)   Collection Time: 01/12/19  2:00 PM   Specimen: BLOOD  Result Value Ref Range Status   Specimen Description BLOOD BLOOD RIGHT HAND  Final   Special Requests   Final    BOTTLES DRAWN AEROBIC AND ANAEROBIC Blood Culture results may not be optimal due to an inadequate volume of blood received in culture bottles   Culture   Final    NO GROWTH 4 DAYS Performed at [facility], [address] [place] [address]    Report Status PENDING  Incomplete  Blood Culture (routine x 2)     Status: None (Preliminary result)   Collection Time: 01/12/19  2:00 PM   Specimen: BLOOD  Result Value Ref Range Status   Specimen Description BLOOD RIGHT  ANTECUBITAL  Final   Special Requests   Final    BOTTLES DRAWN AEROBIC AND ANAEROBIC Blood Culture adequate volume   Culture   Final    NO GROWTH 4 DAYS Performed at [facility], [address] [place], [place] [address]    Report Status PENDING  Incomplete     Scheduled Meds: . amLODipine  5 mg Oral Daily  . benzonatate  100 mg Oral TID  . buPROPion  300 mg Oral Daily  . dexamethasone (DECADRON) injection  6 mg Intravenous Q24H  . enoxaparin (LOVENOX) injection  50 mg Subcutaneous QHS  . feeding supplement (ENSURE ENLIVE)  237 mL Oral BID BM  . fluticasone  2 spray Each Nare Daily  . fluticasone furoate-vilanterol  1 puff Inhalation Daily  . insulin aspart  0-20 Units Subcutaneous TID WC  . insulin aspart  0-5 Units Subcutaneous QHS  . insulin glargine  16 Units Subcutaneous Daily  . linagliptin  5 mg Oral Daily  . losartan  100 mg Oral Daily  . mouth rinse  15 mL Mouth Rinse BID  . metoprolol succinate  50 mg Oral Daily  . montelukast  10 mg Oral Daily  . pantoprazole  40 mg Oral Daily  . umeclidinium bromide  1 puff Inhalation Daily  . vitamin C  500 mg Oral Daily  . zinc sulfate  220 mg Oral Daily     LOS: 4 days   Cherene Altes, MD [facility]  Office  [phone] Pager - Text Page per Amion  If 7PM-7AM, please contact night-coverage per Amion 01/16/2019, 8:07 AM

## 2019-01-16 NOTE — Plan of Care (Signed)
  Problem: Education: Goal: Knowledge of risk factors and measures for prevention of condition will improve Outcome: Progressing   Problem: Coping: Goal: Psychosocial and spiritual needs will be supported Outcome: Progressing   Problem: Respiratory: Goal: Will maintain a patent airway Outcome: Progressing Goal: Complications related to the disease process, condition or treatment will be avoided or minimized Outcome: Progressing   

## 2019-01-16 NOTE — [place] Notes (Signed)
Inpatient Diabetes Program Recommendations  AACE/ADA: New Consensus Statement on Inpatient Glycemic Control (2015)  Target Ranges:  Prepandial:   less than 140 mg/dL      Peak postprandial:   less than 180 mg/dL (1-2 hours)      Critically ill patients:  140 - 180 mg/dL   Lab Results  Component Value Date   GLUCAP 152 (H) 01/16/2019   HGBA1C 7.3 (H) 01/13/2019    Review of Glycemic Control Results for Amanda Wiley, Amanda Wiley (MRN [id number]) as of 01/16/2019 12:13  Ref. Range 01/15/2019 08:03 01/15/2019 11:52 01/15/2019 16:43 01/15/2019 21:29 01/16/2019 07:49  Glucose-Capillary Latest Ref Range: 70 - 99 mg/dL 172 (H) 233 (H) 366 (H) 203 (H) 152 (H)   Diabetes history: DM 2 Outpatient Diabetes medications:  Trulicity 1.5 mg weekly, Jardiance 25 mg daily, Metformin 1000 mg bid Current orders for Inpatient glycemic control: Lantus 16 units daily, Novolog 0-20 units TID with meals, Novolog 0-5 units QHS, Tradjenta 5 mg daily; Decadron 6 mg Q24H  Inpatient Diabetes Program Recommendations:    Insulin-Meal Coverage: If steroids are continued, please consider ordering Novolog 5 units TID with meals for meal coverage if patient eats at least 50% of meals.  Thanks,  Adah Perl, RN, BC-ADM Inpatient Diabetes Coordinator Pager [phone] (8a-5p)

## 2019-01-16 NOTE — [place] Notes (Signed)
Ambulated to bathroom, encouraged pt at night to use call light so staff can work with her to the bathroom. Pt experiences anxiety. Educated her cough meds and how take them so they can be effective, along with the incentive.  Encouraged her to take robitussin before shift change, take schedule tessalon as schedule, after one she will be able take tussinex. Pt noted, "I will try it."   Staff will continue to educate and monitor.

## 2019-01-17 LAB — GLUCOSE, CAPILLARY
Glucose-Capillary: 224 mg/dL — ABNORMAL HIGH (ref 70–99)
Glucose-Capillary: 223 mg/dL — ABNORMAL HIGH (ref 70–99)
Glucose-Capillary: 326 mg/dL — ABNORMAL HIGH (ref 70–99)
Glucose-Capillary: 339 mg/dL — ABNORMAL HIGH (ref 70–99)

## 2019-01-17 LAB — CULTURE, BLOOD (ROUTINE X 2)
Culture: NO GROWTH
Culture: NO GROWTH
Special Requests: ADEQUATE

## 2019-01-17 MED ORDER — INSULIN ASPART 100 UNIT/ML ~~LOC~~ SOLN
8.0000 [IU] | Freq: Three times a day (TID) | SUBCUTANEOUS | Status: DC
  Administered 2019-01-17: 8 [IU] via SUBCUTANEOUS

## 2019-01-17 MED ORDER — INSULIN GLARGINE 100 UNIT/ML ~~LOC~~ SOLN
20.0000 [IU] | Freq: Every day | SUBCUTANEOUS | Status: DC
  Administered 2019-01-17: 20 [IU] via SUBCUTANEOUS
  Filled 2019-01-17: qty 0.2

## 2019-01-17 MED ORDER — INSULIN GLARGINE 100 UNIT/ML ~~LOC~~ SOLN
24.0000 [IU] | Freq: Every day | SUBCUTANEOUS | Status: DC
  Filled 2019-01-17: qty 0.24

## 2019-01-17 MED ORDER — INSULIN ASPART 100 UNIT/ML ~~LOC~~ SOLN
10.0000 [IU] | Freq: Three times a day (TID) | SUBCUTANEOUS | Status: DC
  Administered 2019-01-17: 10 [IU] via SUBCUTANEOUS

## 2019-01-17 MED ORDER — FUROSEMIDE 10 MG/ML IJ SOLN
40.0000 mg | Freq: Four times a day (QID) | INTRAMUSCULAR | Status: AC
  Administered 2019-01-17 (×2): 40 mg via INTRAVENOUS
  Filled 2019-01-17 (×2): qty 4

## 2019-01-17 NOTE — [place] Notes (Signed)
TEAM 1 - Stepdown/ICU TEAM  Amanda Wiley  QBH:[id number] DOB: 02-15-1956 DOA: 01/12/2019 PCP: Einar Pheasant, MD    Brief Narrative:  8BD with a history of COPD, DM 2, HTN, HLD, hepatomegaly, and obesity who presented to the ED 4 days after a positive COVID test in the outpatient setting with complaints of shortness of breath.  She was found to be hypoxic at 86% on room air with a chest x-ray noting bibasilar infiltrates.  Significant Events: 10/1 SARS-CoV-2 test positive 10/5 admit to [facility] via [facility] ED  COVID-19 specific Treatment: Remdesivir 10/5 > 10/9 Decadron 10/5 > Convalescent plasma 10/6  Subjective: CBG is uncontrolled and climbing.  Desaturates into the mid/low 80s on 4 L nasal cannula when exerting herself.  Still feels very fatigued.  Denies chest pain nausea vomiting or abdominal pain.  Assessment & Plan:  COVID pneumonia - acute hypoxic respiratory failure Has received convalescent plasma -completed a course of remdesivir - continue Decadron - wean oxygen as able -ambulate with therapy -diurese in hopes of improving oxygenation - follow-up CXR in a.m.  Recent Labs  Lab 01/12/19 1400 01/13/19 0422 01/14/19 0448 01/15/19 0230 01/16/19 0130  DDIMER  --  0.36 0.40 0.48 0.38  FERRITIN 153  --   --  121  --   CRP 4.3* 6.5* 3.5* 1.7* 3.2*  ALT 50* 58* 42 33 109*  PROCALCITON <0.10  --   --   --   --      DM 2 A1c 7.3 - added linagliptin as there is some recent evidence suggesting this may in fact also provide a benefit in patients with COVID -CBG not yet at goal - adjust treatment again today and follow  COPD Well compensated at present  HTN No change in treatment plan today  Hepatic steatosis / hepatomegaly Noted on recent right upper quadrant ultrasound (12/22/2018) - weight loss advised -will need follow-up as outpatient  Thrombocytopenia Platelet count has normalized   History of polycythemia vera Baseline hemoglobin 14.5-15.7  -hemoglobin stable at this time  Morbid obesity - Estimated body mass index is 34.85 kg/m as calculated from the following:   Height as of this encounter: 5' 5"  (1.651 m).   Weight as of this encounter: 95 kg.   DVT prophylaxis: Lovenox Code Status: FULL CODE Family Communication:  Disposition Plan:   Consultants:  none  Antimicrobials:  None  Objective: Blood pressure 114/70, pulse 63, temperature 98.1 F (36.7 C), temperature source Oral, resp. rate 16, height 5' 5"  (1.651 m), weight 95 kg, SpO2 90 %.  Intake/Output Summary (Last 24 hours) at 01/17/2019 0746 Last data filed at 01/16/2019 1800 Gross per 24 hour  Intake 1115 ml  Output -  Net 1115 ml   Filed Weights   01/13/19 0327 01/14/19 0500 01/15/19 0500  Weight: 94.8 kg 94 kg 95 kg    Examination: General: Respirations somewhat more labored today Lungs: Scattered fine crackles diffusely bilaterally without significant change Cardiovascular: RRR -no murmur Abdomen: NT/ND, soft, BS+ Extremities: No C/C/E B lower extremities  CBC: Recent Labs  Lab 01/14/19 0448 01/15/19 0230 01/16/19 0130  WBC 4.8 8.0 11.4*  NEUTROABS 3.8 5.7 7.7  HGB 15.2* 14.7 15.9*  HCT 47.3* 46.0 49.4*  MCV 90.4 90.0 89.0  PLT 141* 208 532   Basic Metabolic Panel: Recent Labs  Lab 01/14/19 0448 01/15/19 0230 01/16/19 0130  NA 141 140 140  K 4.7 4.0 3.7  CL 103 102 101  CO2 22 23  23  GLUCOSE 249* 170* 159*  BUN 26* 29* 31*  CREATININE 0.92 0.79 0.98  CALCIUM 8.8* 8.5* 8.7*   GFR: Estimated Creatinine Clearance: 67 mL/min (by C-G formula based on SCr of 0.98 mg/dL).  Liver Function Tests: Recent Labs  Lab 01/13/19 0422 01/14/19 0448 01/15/19 0230 01/16/19 0130  AST 27 30 21  34  ALT 45* 42 33 47*  ALKPHOS 45 45 42 48  BILITOT 0.6 0.8 0.5 0.9  PROT 6.6 7.2 6.6 7.1  ALBUMIN 3.1* 3.4* 3.1* 3.3*    HbA1C: Hemoglobin A1C  Date/Time Value Ref Range Status  02/20/2018 08:25 AM 6.7 (A) 4.0 - 5.6 % Final   Hgb A1c  MFr Bld  Date/Time Value Ref Range Status  01/13/2019 04:22 AM 7.3 (H) 4.8 - 5.6 % Final    Comment:    (NOTE) Pre diabetes:          5.7%-6.4% Diabetes:              >6.4% Glycemic control for   <7.0% adults with diabetes   02/01/2017 08:01 AM 6.9 (H) 4.6 - 6.5 % Final    Comment:    Glycemic Control Guidelines for People with Diabetes:Non Diabetic:  <6%Goal of Therapy: <7%Additional Action Suggested:  >8%     CBG: Recent Labs  Lab 01/15/19 2129 01/16/19 0749 01/16/19 1220 01/16/19 1626 01/16/19 2120  GLUCAP 203* 152* 265* 364* 205*    Recent Results (from the past 240 hour(s))  Novel Coronavirus, NAA ([facility])     Status: Abnormal   Collection Time: 01/08/19 12:00 AM   Specimen: Oropharyngeal(OP) collection in vial transport medium   OROPHARYNGEA  TESTING  Result Value Ref Range Status   SARS-CoV-2, NAA Detected (A) Not Detected Final    Comment: This nucleic acid amplification test was developed and its performance characteristics determined by [employer]. Nucleic acid amplification tests include PCR and TMA. This test has not been FDA cleared or approved. This test has been authorized by FDA under an Emergency Use Authorization (EUA). This test is only authorized for the duration of time the declaration that circumstances exist justifying the authorization of the emergency use of in vitro diagnostic tests for detection of SARS-CoV-2 virus and/or diagnosis of COVID-19 infection under section 564(b)(1) of the Act, 21 U.S.C. 185UDJ-4(H) (1), unless the authorization is terminated or revoked sooner. When diagnostic testing is negative, the possibility of a false negative result should be considered in the context of a patient's recent exposures and the presence of clinical signs and symptoms consistent with COVID-19. An individual without symptoms of COVID-19 and who is not shedding SARS-CoV-2 virus would  expect to have a negative (not detected) result in  this assay.   Blood Culture (routine x 2)     Status: None   Collection Time: 01/12/19  2:00 PM   Specimen: BLOOD  Result Value Ref Range Status   Specimen Description BLOOD BLOOD RIGHT HAND  Final   Special Requests   Final    BOTTLES DRAWN AEROBIC AND ANAEROBIC Blood Culture results may not be optimal due to an inadequate volume of blood received in culture bottles   Culture   Final    NO GROWTH 5 DAYS Performed at [facility], [address], [place],  [address]    Report Status 01/17/2019 FINAL  Final  Blood Culture (routine x 2)     Status: None   Collection Time: 01/12/19  2:00 PM   Specimen: BLOOD  Result Value  Ref Range Status   Specimen Description BLOOD RIGHT ANTECUBITAL  Final   Special Requests   Final    BOTTLES DRAWN AEROBIC AND ANAEROBIC Blood Culture adequate volume   Culture   Final    NO GROWTH 5 DAYS Performed at [facility], [address] [place],  [address]    Report Status 01/17/2019 FINAL  Final     Scheduled Meds: . amLODipine  5 mg Oral Daily  . benzonatate  100 mg Oral TID  . buPROPion  300 mg Oral Daily  . dexamethasone  6 mg Oral Daily  . enoxaparin (LOVENOX) injection  50 mg Subcutaneous QHS  . feeding supplement (ENSURE ENLIVE)  237 mL Oral BID BM  . fluticasone  2 spray Each Nare Daily  . fluticasone furoate-vilanterol  1 puff Inhalation Daily  . insulin aspart  0-20 Units Subcutaneous TID WC  . insulin aspart  0-5 Units Subcutaneous QHS  . insulin aspart  5 Units Subcutaneous TID WC  . insulin glargine  16 Units Subcutaneous Daily  . linagliptin  5 mg Oral Daily  . losartan  100 mg Oral Daily  . mouth rinse  15 mL Mouth Rinse BID  . metoprolol succinate  50 mg Oral Daily  . montelukast  10 mg Oral Daily  . pantoprazole  40 mg Oral Daily  . umeclidinium bromide  1 puff Inhalation Daily  . vitamin C  500 mg Oral Daily  . zinc sulfate  220 mg Oral Daily     LOS: 5 days   Cherene Altes, MD  [facility] Office  [phone] Pager - Text Page per Amion  If 7PM-7AM, please contact night-coverage per Amion 01/17/2019, 7:46 AM

## 2019-01-17 NOTE — TOC Initial Note (Signed)
Transition of Care [facility]) - Initial/Assessment Note    Patient Details  Name: Amanda Wiley MRN: [id number] Date of Birth: March 15, 1956  Transition of [facility]) CM/SW Contact:    Ninfa Meeker, RN Phone Number: [phone] (working remotely) 01/17/2019, 12:02 PM  Clinical Narrative:    63yo female  with a history of COPD, DM 2, HTN, HLD, hepatomegaly, and obesity who presented to the ED 4 days after a positive COVID test in the outpatient setting with complaints of shortness of breath. Patient admitted for treatment of COVID 19. Per therapy notes she will not need Home Health therapy at discharge. Case manager  will continue to follow for possible oxygen needs for home. .      Expected Discharge Plan: Home/Self Care Barriers to Discharge: Continued Medical Work up   Patient Goals and CMS Choice        Expected Discharge Plan and Services Expected Discharge Plan: Home/Self Care                                              Prior Living Arrangements/Services                       Activities of Daily Living Home Assistive Devices/Equipment: None ADL Screening (condition at time of admission) Patient's cognitive ability adequate to safely complete daily activities?: Yes Is the patient deaf or have difficulty hearing?: No Does the patient have difficulty seeing, even when wearing glasses/contacts?: No Does the patient have difficulty concentrating, remembering, or making decisions?: No Patient able to express need for assistance with ADLs?: No Does the patient have difficulty dressing or bathing?: No Independently performs ADLs?: Yes (appropriate for developmental age) Does the patient have difficulty walking or climbing stairs?: No Weakness of Legs: None Weakness of Arms/Hands: None  Permission Sought/Granted                  Emotional Assessment              Admission diagnosis:  covid 19 Patient Active Problem List   Diagnosis  Date Noted  . Pneumonia due to 2019-nCoV 01/12/2019  . Acute respiratory failure due to COVID-19 ([place]) 01/12/2019  . Abnormal liver function tests 12/24/2018  . Hepatomegaly 12/24/2018  . Cyst of left kidney 12/22/2018  . Sinusitis 06/21/2018  . Polycythemia vera ([place]) 04/04/2018  . 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 Nipple discharge 02/16/2018  . Health care maintenance 12/11/2014  . Anxiety and depression 12/09/2014  . Allergic drug reaction 12/09/2014  . Personal history of urinary calculi 12/09/2014  . Obesity, diabetes, and hypertension syndrome ([place]) 04/18/2014  . COPD exacerbation ([place]) 05/27/2013  . Asthma 03/04/2012  . Vitamin D deficiency 03/04/2012  . Crohn's disease ([place]) 03/04/2012  . GERD (gastroesophageal reflux disease) 03/04/2012  . Diabetes mellitus ([place]) 03/04/2012  . Hypertension 03/04/2012  . Acid reflux 03/04/2012  . Essential (primary) hypertension 03/04/2012  . Hypercholesterolemia without hypertriglyceridemia 03/04/2012  . CD (Crohn's disease) ([place]) 03/04/2012  . 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 [place] [address] Phone: [phone] Fax: [phone]  [place], [place] [address] [place] [address] Phone: [phone] Fax: [phone]  [facility] [place], [place] [address] [place] [address] Phone: [phone] Fax: [phone]     Social Determinants of Health (SDOH) Interventions    Readmission Risk Interventions No flowsheet data found.

## 2019-01-17 NOTE — Plan of Care (Signed)
Pt is resting comfortably in bed. Denies pain, nausea and shortness of breath. Updated the pt's spouse regarding condition and plan of care.

## 2019-01-17 NOTE — [place] Notes (Signed)
Lying in bed, sleeping at this time. She continues to require 4-5 L [place] o2 to keep sats 80s-90s. C/O feeling tired and stated earlier that she did not sleep well last night. Dyspnea on exertion noted when she ambulates to bathroom and with standing from chair to bed. C/O feeling short of breath w/ any physical activity. Continuing to monitor sats continuously. Husband called and updates provided.

## 2019-01-17 NOTE — [place] Notes (Addendum)
Assisted up to bathroom and to ambulate in room . Oxygen sats drop to 70's on 5L [place] with exertion. Assisted back to bed and sats up to 90% after one full minute. Dyspnea noted and Tachypnea RR 24.  After rest, Sats 90% on 5L [place].  She wants to lay in the bed. Educated on laying in a prone position. She states she feels tired. Assisted to a semi Prone position as tolerated by her.  Oxygen sats 88-91% in semi prone position. Continuing to monitor.  Notified MD to make aware of increased tiredness and oxygen demands to keep sats greater than 90%.

## 2019-01-18 ENCOUNTER — Inpatient Hospital Stay (HOSPITAL_COMMUNITY)

## 2019-01-18 LAB — GLUCOSE, CAPILLARY
Glucose-Capillary: 226 mg/dL — ABNORMAL HIGH (ref 70–99)
Glucose-Capillary: 271 mg/dL — ABNORMAL HIGH (ref 70–99)
Glucose-Capillary: 227 mg/dL — ABNORMAL HIGH (ref 70–99)
Glucose-Capillary: 218 mg/dL — ABNORMAL HIGH (ref 70–99)

## 2019-01-18 MED ORDER — INSULIN GLARGINE 100 UNIT/ML ~~LOC~~ SOLN
28.0000 [IU] | Freq: Every day | SUBCUTANEOUS | Status: DC
  Administered 2019-01-19: 28 [IU] via SUBCUTANEOUS
  Filled 2019-01-18: qty 0.28

## 2019-01-18 MED ORDER — INSULIN ASPART 100 UNIT/ML ~~LOC~~ SOLN
14.0000 [IU] | Freq: Three times a day (TID) | SUBCUTANEOUS | Status: DC
  Administered 2019-01-18 – 2019-01-19 (×3): 14 [IU] via SUBCUTANEOUS

## 2019-01-18 MED ORDER — INSULIN GLARGINE 100 UNIT/ML ~~LOC~~ SOLN
26.0000 [IU] | Freq: Every day | SUBCUTANEOUS | Status: DC
  Administered 2019-01-18: 26 [IU] via SUBCUTANEOUS
  Filled 2019-01-18: qty 0.26

## 2019-01-18 MED ORDER — INSULIN ASPART 100 UNIT/ML ~~LOC~~ SOLN
12.0000 [IU] | Freq: Three times a day (TID) | SUBCUTANEOUS | Status: DC
  Administered 2019-01-18 (×2): 12 [IU] via SUBCUTANEOUS

## 2019-01-18 MED ORDER — FUROSEMIDE 10 MG/ML IJ SOLN
40.0000 mg | Freq: Every day | INTRAMUSCULAR | Status: DC
  Administered 2019-01-18 – 2019-01-19 (×2): 40 mg via INTRAVENOUS
  Filled 2019-01-18 (×2): qty 4

## 2019-01-18 NOTE — Progress Notes (Signed)
[place] TEAM 1 - Stepdown/ICU TEAM  Amanda Wiley  SVX:[id number] DOB: 1955-10-30 DOA: 01/12/2019 PCP: Einar Pheasant, MD    Brief Narrative:  22RA with a history of COPD, DM 2, HTN, HLD, hepatomegaly, and obesity who presented to the ED 4 days after a positive COVID test in the outpatient setting with complaints of shortness of breath.  She was found to be hypoxic at 86% on room air with a chest x-ray noting bibasilar infiltrates.  Significant Events: 10/1 SARS-CoV-2 test positive 10/5 admit to [facility] via [facility] ED  COVID-19 specific Treatment: Remdesivir 10/5 > 10/9 Decadron 10/5 > Convalescent plasma 10/6  Subjective: Continues to require 5 L nasal cannula support even at rest with episodic desaturations.  I have reviewed her follow-up chest x-ray from this morning which suggest no significant change in her bilateral pulmonary infiltrates.  She remains quite dyspneic with attempts to ambulate.  She denies chest pain nausea vomiting.  Assessment & Plan:  COVID pneumonia - acute hypoxic respiratory failure Has received convalescent plasma -completed a course of remdesivir - continue Decadron - wean oxygen as able -ambulate with therapy -diurese in hopes of improving oxygenation  DM 2 A1c 7.3 - added linagliptin as there is some recent evidence suggesting this may in fact also provide a benefit in patients with COVID -CBG not yet at goal -adjusting treatment again today  COPD Well compensated at present  HTN No change in treatment plan today  Hepatic steatosis / hepatomegaly Noted on recent right upper quadrant ultrasound (12/22/2018) - weight loss advised -will need follow-up as outpatient  Thrombocytopenia Platelet count has normalized   History of polycythemia vera Baseline hemoglobin 14.5-15.7 -hemoglobin stable at this time  Morbid obesity - Estimated body mass index is 31.7 kg/m as calculated from the following:   Height as of this encounter: 5' 5"  (1.651  m).   Weight as of this encounter: 86.4 kg.   DVT prophylaxis: Lovenox Code Status: FULL CODE Family Communication:  Disposition Plan:   Consultants:  none  Antimicrobials:  None  Objective: Blood pressure (!) 111/57, pulse 62, temperature 98.1 F (36.7 C), temperature source Oral, resp. rate 16, height 5' 5"  (1.651 m), weight 86.4 kg, SpO2 96 %.  Intake/Output Summary (Last 24 hours) at 01/18/2019 0831 Last data filed at 01/18/2019 0100 Gross per 24 hour  Intake 440 ml  Output 450 ml  Net -10 ml   Filed Weights   01/15/19 0500 01/17/19 0756 01/18/19 0451  Weight: 95 kg 95.6 kg 86.4 kg    Examination: General: Appears somewhat more comfortable breathing today Lungs: Scattered fine crackles without significant change today Cardiovascular: RRR  Abdomen: NT/ND, soft, BS+ Extremities: No C/C/E B LE  CBC: Recent Labs  Lab 01/14/19 0448 01/15/19 0230 01/16/19 0130  WBC 4.8 8.0 11.4*  NEUTROABS 3.8 5.7 7.7  HGB 15.2* 14.7 15.9*  HCT 47.3* 46.0 49.4*  MCV 90.4 90.0 89.0  PLT 141* 208 076   Basic Metabolic Panel: Recent Labs  Lab 01/14/19 0448 01/15/19 0230 01/16/19 0130  NA 141 140 140  K 4.7 4.0 3.7  CL 103 102 101  CO2 22 23 23   GLUCOSE 249* 170* 159*  BUN 26* 29* 31*  CREATININE 0.92 0.79 0.98  CALCIUM 8.8* 8.5* 8.7*   GFR: Estimated Creatinine Clearance: 63.8 mL/min (by C-G formula based on SCr of 0.98 mg/dL).  Liver Function Tests: Recent Labs  Lab 01/13/19 0422 01/14/19 0448 01/15/19 0230 01/16/19 0130  AST 27 30 21  34  ALT 45* 42 33 47*  ALKPHOS 45 45 42 48  BILITOT 0.6 0.8 0.5 0.9  PROT 6.6 7.2 6.6 7.1  ALBUMIN 3.1* 3.4* 3.1* 3.3*    HbA1C: Hemoglobin A1C  Date/Time Value Ref Range Status  02/20/2018 08:25 AM 6.7 (A) 4.0 - 5.6 % Final   Hgb A1c MFr Bld  Date/Time Value Ref Range Status  01/13/2019 04:22 AM 7.3 (H) 4.8 - 5.6 % Final    Comment:    (NOTE) Pre diabetes:          5.7%-6.4% Diabetes:              >6.4%  Glycemic control for   <7.0% adults with diabetes   02/01/2017 08:01 AM 6.9 (H) 4.6 - 6.5 % Final    Comment:    Glycemic Control Guidelines for People with Diabetes:Non Diabetic:  <6%Goal of Therapy: <7%Additional Action Suggested:  >8%     CBG: Recent Labs  Lab 01/17/19 0758 01/17/19 1138 01/17/19 1623 01/17/19 2109 01/18/19 0723  GLUCAP 224* 223* 326* 339* 226*    Recent Results (from the past 240 hour(s))  Blood Culture (routine x 2)     Status: None   Collection Time: 01/12/19  2:00 PM   Specimen: BLOOD  Result Value Ref Range Status   Specimen Description BLOOD BLOOD RIGHT HAND  Final   Special Requests   Final    BOTTLES DRAWN AEROBIC AND ANAEROBIC Blood Culture results may not be optimal due to an inadequate volume of blood received in culture bottles   Culture   Final    NO GROWTH 5 DAYS Performed at [facility], [address] [place], [place] [address]    Report Status 01/17/2019 FINAL  Final  Blood Culture (routine x 2)     Status: None   Collection Time: 01/12/19  2:00 PM   Specimen: BLOOD  Result Value Ref Range Status   Specimen Description BLOOD RIGHT ANTECUBITAL  Final   Special Requests   Final    BOTTLES DRAWN AEROBIC AND ANAEROBIC Blood Culture adequate volume   Culture   Final    NO GROWTH 5 DAYS Performed at [facility], [address] [place], [place] [address]    Report Status 01/17/2019 FINAL  Final     Scheduled Meds: . amLODipine  5 mg Oral Daily  . benzonatate  100 mg Oral TID  . buPROPion  300 mg Oral Daily  . dexamethasone  6 mg Oral Daily  . enoxaparin (LOVENOX) injection  50 mg Subcutaneous QHS  . feeding supplement (ENSURE ENLIVE)  237 mL Oral BID BM  . fluticasone  2 spray Each Nare Daily  . fluticasone furoate-vilanterol  1 puff Inhalation Daily  . insulin aspart  0-20 Units Subcutaneous TID WC  . insulin aspart  0-5 Units Subcutaneous QHS  . insulin aspart  12 Units Subcutaneous TID WC  . insulin  glargine  26 Units Subcutaneous Daily  . linagliptin  5 mg Oral Daily  . losartan  100 mg Oral Daily  . mouth rinse  15 mL Mouth Rinse BID  . metoprolol succinate  50 mg Oral Daily  . montelukast  10 mg Oral Daily  . pantoprazole  40 mg Oral Daily  . umeclidinium bromide  1 puff Inhalation Daily  . vitamin C  500 mg Oral Daily  . zinc sulfate  220 mg Oral Daily     LOS: 6 days   Cherene Altes, MD  [facility] Office  [phone] Pager - Text Page per Shea Evans  If 7PM-7AM, please contact night-coverage per Amion 01/18/2019, 8:31 AM

## 2019-01-19 LAB — COMPREHENSIVE METABOLIC PANEL
ALT: 40 U/L (ref 0–44)
AST: 22 U/L (ref 15–41)
Albumin: 2.8 g/dL — ABNORMAL LOW (ref 3.5–5.0)
Alkaline Phosphatase: 42 U/L (ref 38–126)
Anion gap: 14 (ref 5–15)
BUN: 32 mg/dL — ABNORMAL HIGH (ref 8–23)
CO2: 26 mmol/L (ref 22–32)
Calcium: 8.8 mg/dL — ABNORMAL LOW (ref 8.9–10.3)
Chloride: 100 mmol/L (ref 98–111)
Creatinine, Ser: 1.01 mg/dL — ABNORMAL HIGH (ref 0.44–1.00)
GFR calc Af Amer: >60 mL/min (ref >60)
GFR calc non Af Amer: 59 mL/min — ABNORMAL LOW (ref >60)
Glucose, Bld: 154 mg/dL — ABNORMAL HIGH (ref 70–99)
Potassium: 3.9 mmol/L (ref 3.5–5.1)
Sodium: 140 mmol/L (ref 135–145)
Total Bilirubin: 1.1 mg/dL (ref 0.3–1.2)
Total Protein: 6.5 g/dL (ref 6.5–8.1)

## 2019-01-19 LAB — GLUCOSE, CAPILLARY
Glucose-Capillary: 143 mg/dL — ABNORMAL HIGH (ref 70–99)
Glucose-Capillary: 365 mg/dL — ABNORMAL HIGH (ref 70–99)

## 2019-01-19 MED ORDER — ACETAMINOPHEN 325 MG PO TABS: 650.0000 mg | ORAL_TABLET | Freq: Four times a day (QID) | ORAL | Status: DC | PRN

## 2019-01-19 MED ORDER — LINAGLIPTIN 5 MG PO TABS: 5.0000 mg | ORAL_TABLET | Freq: Every day | ORAL | 0 refills | Status: DC

## 2019-01-19 NOTE — TOC Transition Note (Addendum)
Transition of Care [facility]) - CM/SW Discharge Note   Patient Details  Name: KJIRSTEN BLOODGOOD MRN: [id number] Date of Birth: 08/24/1955  Transition of Care [facility]) CM/SW Contact:  Carles Collet, RN Phone Number: 01/19/2019, 12:54 PM   Clinical Narrative:   Damaris Schooner w patient and she will need oxygen for home. [employer] will deliver concentrator to home and [facility] will take tank and pulse ox to room. [facility]IL will provide transport home. Spoke w patient's spouse at home and will call me back after [employer] has delivered oxygen so we can send patient home.            Expected Discharge Plan and Services Expected Discharge Plan: Home/Self Care         Expected Discharge Date: 01/19/19               DME Arranged: Oxygen DME Agency: [place] Date DME Agency Contacted: 01/19/19 Time DME Agency Contacted: [phone] Representative spoke with at DME Agency: leslie               Final next level of care: Home/Self Care Barriers to Discharge: No Barriers Identified   Patient Goals and CMS Choice Patient states their goals for this hospitalization and ongoing recovery are:: to go home      Discharge Placement                       Discharge Plan and Services                DME Arranged: Oxygen DME Agency: [place] Date DME Agency Contacted: 01/19/19 Time DME Agency Contacted: [phone] Representative spoke with at DME Agency: leslie            Social Determinants of Health ([place]) Interventions     Readmission Risk Interventions No flowsheet data found.

## 2019-01-19 NOTE — Care Management (Addendum)
[employer] to deliver O2 at home at 3:30, then son in law will come get patient 15:30 Notified by spouse that oxygen arrived at home, SIL on way, secure chat sent to nurse. No other CM needs for DC.

## 2019-01-19 NOTE — Discharge Instructions (Signed)
COVID-19 COVID-19 is a respiratory infection that is caused by a virus called severe acute respiratory syndrome coronavirus 2 (SARS-CoV-2). The disease is also known as coronavirus disease or novel coronavirus. In some people, the virus may not cause any symptoms. In others, it may cause a serious infection. The infection can get worse quickly and can lead to complications, such as:  Pneumonia, or infection of the lungs.  Acute respiratory distress syndrome or ARDS. This is fluid build-up in the lungs.  Acute respiratory failure. This is a condition in which there is not enough oxygen passing from the lungs to the body.  Sepsis or septic shock. This is a serious bodily reaction to an infection.  Blood clotting problems.  Secondary infections due to bacteria or fungus. The virus that causes COVID-19 is contagious. This means that it can spread from person to person through droplets from coughs and sneezes (respiratory secretions). What are the causes? This illness is caused by a virus. You may catch the virus by:  Breathing in droplets from an infected person's cough or sneeze.  Touching something, like a table or a doorknob, that was exposed to the virus (contaminated) and then touching your mouth, nose, or eyes. What increases the risk? Risk for infection You are more likely to be infected with this virus if you:  Live in or travel to an area with a COVID-19 outbreak.  Come in contact with a sick person who recently traveled to an area with a COVID-19 outbreak.  Provide care for or live with a person who is infected with COVID-19. Risk for serious illness You are more likely to become seriously ill from the virus if you:  Are 63 years of age or older.  Have a long-term disease that lowers your body's ability to fight infection (immunocompromised).  Live in a nursing home or long-term care facility.  Have a long-term (chronic) disease such as: ? Chronic lung disease, including  chronic obstructive pulmonary disease or asthma ? Heart disease. ? Diabetes. ? Chronic kidney disease. ? Liver disease.  Are obese. What are the signs or symptoms? Symptoms of this condition can range from mild to severe. Symptoms may appear any time from 2 to 14 days after being exposed to the virus. They include:  A fever.  A cough.  Difficulty breathing.  Chills.  Muscle pains.  A sore throat.  Loss of taste or smell. Some people may also have stomach problems, such as nausea, vomiting, or diarrhea. Other people may not have any symptoms of COVID-19. How is this diagnosed? This condition may be diagnosed based on:  Your signs and symptoms, especially if: ? You live in an area with a COVID-19 outbreak. ? You recently traveled to or from an area where the virus is common. ? You provide care for or live with a person who was diagnosed with COVID-19.  A physical exam.  Lab tests, which may include: ? A nasal swab to take a sample of fluid from your nose. ? A throat swab to take a sample of fluid from your throat. ? A sample of mucus from your lungs (sputum). ? Blood tests.  Imaging tests, which may include, X-rays, CT scan, or ultrasound. How is this treated? At present, there is no medicine to treat COVID-19. Medicines that treat other diseases are being used on a trial basis to see if they are effective against COVID-19. Your health care provider will talk with you about ways to treat your symptoms. For most  people, the infection is mild and can be managed at home with rest, fluids, and over-the-counter medicines. Treatment for a serious infection usually takes places in a hospital intensive care unit (ICU). It may include one or more of the following treatments. These treatments are given until your symptoms improve.  Receiving fluids and medicines through an IV.  Supplemental oxygen. Extra oxygen is given through a tube in the nose, a face mask, or a  hood.  Positioning you to lie on your stomach (prone position). This makes it easier for oxygen to get into the lungs.  Continuous positive airway pressure (CPAP) or bi-level positive airway pressure (BPAP) machine. This treatment uses mild air pressure to keep the airways open. A tube that is connected to a motor delivers oxygen to the body.  Ventilator. This treatment moves air into and out of the lungs by using a tube that is placed in your windpipe.  Tracheostomy. This is a procedure to create a hole in the neck so that a breathing tube can be inserted.  Extracorporeal membrane oxygenation (ECMO). This procedure gives the lungs a chance to recover by taking over the functions of the heart and lungs. It supplies oxygen to the body and removes carbon dioxide. Follow these instructions at home: Lifestyle  If you are sick, stay home except to get medical care. Your health care provider will tell you how long to stay home. Call your health care provider before you go for medical care.  Rest at home as told by your health care provider.  Do not use any products that contain nicotine or tobacco, such as cigarettes, e-cigarettes, and chewing tobacco. If you need help quitting, ask your health care provider.  Return to your normal activities as told by your health care provider. Ask your health care provider what activities are safe for you. General instructions  Take over-the-counter and prescription medicines only as told by your health care provider.  Drink enough fluid to keep your urine pale yellow.  Keep all follow-up visits as told by your health care provider. This is important. How is this prevented?  There is no vaccine to help prevent COVID-19 infection. However, there are steps you can take to protect yourself and others from this virus. To protect yourself:   Do not travel to areas where COVID-19 is a risk. The areas where COVID-19 is reported change often. To identify  high-risk areas and travel restrictions, check the CDC travel website: FatFares.com.br  If you live in, or must travel to, an area where COVID-19 is a risk, take precautions to avoid infection. ? Stay away from people who are sick. ? Wash your hands often with soap and water for 20 seconds. If soap and water are not available, use an alcohol-based hand sanitizer. ? Avoid touching your mouth, face, eyes, or nose. ? Avoid going out in public, follow guidance from your state and local health authorities. ? If you must go out in public, wear a cloth face covering or face mask. ? Disinfect objects and surfaces that are frequently touched every day. This may include:  Counters and tables.  Doorknobs and light switches.  Sinks and faucets.  Electronics, such as phones, remote controls, keyboards, computers, and tablets. To protect others: If you have symptoms of COVID-19, take steps to prevent the virus from spreading to others.  If you think you have a COVID-19 infection, contact your health care provider right away. Tell your health care team that you think you may  have a COVID-19 infection.  Stay home. Leave your house only to seek medical care. Do not use public transport.  Do not travel while you are sick.  Wash your hands often with soap and water for 20 seconds. If soap and water are not available, use alcohol-based hand sanitizer.  Stay away from other members of your household. Let healthy household members care for children and pets, if possible. If you have to care for children or pets, wash your hands often and wear a mask. If possible, stay in your own room, separate from others. Use a different bathroom.  Make sure that all people in your household wash their hands well and often.  Cough or sneeze into a tissue or your sleeve or elbow. Do not cough or sneeze into your hand or into the air.  Wear a cloth face covering or face mask. Where to find more  information  Centers for Disease Control and Prevention: PurpleGadgets.be  World Health Organization: https://www.castaneda.info/ Contact a health care provider if:  You live in or have traveled to an area where COVID-19 is a risk and you have symptoms of the infection.  You have had contact with someone who has COVID-19 and you have symptoms of the infection. Get help right away if:  You have trouble breathing.  You have pain or pressure in your chest.  You have confusion.  You have bluish lips and fingernails.  You have difficulty waking from sleep.  You have symptoms that get worse. These symptoms may represent a serious problem that is an emergency. Do not wait to see if the symptoms will go away. Get medical help right away. Call your local emergency services (911 in the [place] Do not drive yourself to the hospital. Let the emergency medical personnel know if you think you have COVID-19. Summary  COVID-19 is a respiratory infection that is caused by a virus. It is also known as coronavirus disease or novel coronavirus. It can cause serious infections, such as pneumonia, acute respiratory distress syndrome, acute respiratory failure, or sepsis.  The virus that causes COVID-19 is contagious. This means that it can spread from person to person through droplets from coughs and sneezes.  You are more likely to develop a serious illness if you are 9 years of age or older, have a weak immunity, live in a nursing home, or have chronic disease.  There is no medicine to treat COVID-19. Your health care provider will talk with you about ways to treat your symptoms.  Take steps to protect yourself and others from infection. Wash your hands often and disinfect objects and surfaces that are frequently touched every day. Stay away from people who are sick and wear a mask if you are sick. This information is not intended to replace advice given to you by  your health care provider. Make sure you discuss any questions you have with your health care provider. Document Released: 05/01/2018 Document Revised: 08/21/2018 Document Reviewed: 05/01/2018 Elsevier Patient Education  2020 [employer].    COVID-19 Frequently Asked Questions COVID-19 (coronavirus disease) is an infection that is caused by a large family of viruses. Some viruses cause illness in people and others cause illness in animals like camels, cats, and bats. In some cases, the viruses that cause illness in animals can spread to humans. Where did the coronavirus come from? In December 2019, [place] told the [facility]) of several cases of lung disease (human respiratory illness). These cases were linked to  an open seafood and livestock market in the city of [place]. The link to the seafood and livestock market suggests that the virus may have spread from animals to humans. However, since that first outbreak in December, the virus has also been shown to spread from person to person. What is the name of the disease and the virus? Disease name Early on, this disease was called novel coronavirus. This is because scientists determined that the disease was caused by a new (novel) respiratory virus. The World Health Organization [facility]) has now named the disease COVID-19, or coronavirus disease. Virus name The virus that causes the disease is called severe acute respiratory syndrome coronavirus 2 (SARS-CoV-2). More information on disease and virus naming World Health Organization [facility]): www.who.int/emergencies/diseases/novel-coronavirus-2019/technical-guidance/naming-the-coronavirus-disease-(covid-2019)-and-the-virus-that-causes-it Who is at risk for complications from coronavirus disease? Some people may be at higher risk for complications from coronavirus disease. This includes older adults and people who have chronic diseases, such as heart disease, diabetes, and lung  disease. If you are at higher risk for complications, take these extra precautions:  Avoid close contact with people who are sick or have a fever or cough. Stay at least 3-6 ft (1-2 m) away from them, if possible.  Wash your hands often with soap and water for at least 20 seconds.  Avoid touching your face, mouth, nose, or eyes.  Keep supplies on hand at home, such as food, medicine, and cleaning supplies.  Stay home as much as possible.  Avoid social gatherings and travel. How does coronavirus disease spread? The virus that causes coronavirus disease spreads easily from person to person (is contagious). There are also cases of community-spread disease. This means the disease has spread to:  People who have no known contact with other infected people.  People who have not traveled to areas where there are known cases. It appears to spread from one person to another through droplets from coughing or sneezing. Can I get the virus from touching surfaces or objects? There is still a lot that we do not know about the virus that causes coronavirus disease. Scientists are basing a lot of information on what they know about similar viruses, such as:  Viruses cannot generally survive on surfaces for long. They need a human body (host) to survive.  It is more likely that the virus is spread by close contact with people who are sick (direct contact), such as through: ? Shaking hands or hugging. ? Breathing in respiratory droplets that travel through the air. This can happen when an infected person coughs or sneezes on or near other people.  It is less likely that the virus is spread when a person touches a surface or object that has the virus on it (indirect contact). The virus may be able to enter the body if the person touches a surface or object and then touches his or her face, eyes, nose, or mouth. Can a person spread the virus without having symptoms of the disease? It may be possible for  the virus to spread before a person has symptoms of the disease, but this is most likely not the main way the virus is spreading. It is more likely for the virus to spread by being in close contact with people who are sick and breathing in the respiratory droplets of a sick person's cough or sneeze. What are the symptoms of coronavirus disease? Symptoms vary from person to person and can range from mild to severe. Symptoms may include:  Fever.  Cough.  Tiredness,  weakness, or fatigue.  Fast breathing or feeling short of breath. These symptoms can appear anywhere from 2 to 14 days after you have been exposed to the virus. If you develop symptoms, call your health care provider. People with severe symptoms may need hospital care. If I am exposed to the virus, how long does it take before symptoms start? Symptoms of coronavirus disease may appear anywhere from 2 to 14 days after a person has been exposed to the virus. If you develop symptoms, call your health care provider. Should I be tested for this virus? Your health care provider will decide whether to test you based on your symptoms, history of exposure, and your risk factors. How does a health care provider test for this virus? Health care providers will collect samples to send for testing. Samples may include:  Taking a swab of fluid from the nose.  Taking fluid from the lungs by having you cough up mucus (sputum) into a sterile cup.  Taking a blood sample.  Taking a stool or urine sample. Is there a treatment or vaccine for this virus? Currently, there is no vaccine to prevent coronavirus disease. Also, there are no medicines like antibiotics or antivirals to treat the virus. A person who becomes sick is given supportive care, which means rest and fluids. A person may also relieve his or her symptoms by using over-the-counter medicines that treat sneezing, coughing, and runny nose. These are the same medicines that a person takes for  the common cold. If you develop symptoms, call your health care provider. People with severe symptoms may need hospital care. What can I do to protect myself and my family from this virus?     You can protect yourself and your family by taking the same actions that you would take to prevent the spread of other viruses. Take the following actions:  Wash your hands often with soap and water for at least 20 seconds. If soap and water are not available, use alcohol-based hand sanitizer.  Avoid touching your face, mouth, nose, or eyes.  Cough or sneeze into a tissue, sleeve, or elbow. Do not cough or sneeze into your hand or the air. ? If you cough or sneeze into a tissue, throw it away immediately and wash your hands.  Disinfect objects and surfaces that you frequently touch every day.  Avoid close contact with people who are sick or have a fever or cough. Stay at least 3-6 ft (1-2 m) away from them, if possible.  Stay home if you are sick, except to get medical care. Call your health care provider before you get medical care.  Make sure your vaccines are up to date. Ask your health care provider what vaccines you need. What should I do if I need to travel? Follow travel recommendations from your local health authority, the CDC, and WHO. Travel information and advice  Centers for Disease Control and Prevention (CDC): BodyEditor.hu  World Health Organization [facility]): ThirdIncome.ca Know the risks and take action to protect your health  You are at higher risk of getting coronavirus disease if you are traveling to areas with an outbreak or if you are exposed to travelers from areas with an outbreak.  Wash your hands often and practice good hygiene to lower the risk of catching or spreading the virus. What should I do if I am sick? General instructions to stop the spread of infection  Wash your  hands often with soap and water for at least 20  seconds. If soap and water are not available, use alcohol-based hand sanitizer.  Cough or sneeze into a tissue, sleeve, or elbow. Do not cough or sneeze into your hand or the air.  If you cough or sneeze into a tissue, throw it away immediately and wash your hands.  Stay home unless you must get medical care. Call your health care provider or local health authority before you get medical care.  Avoid public areas. Do not take public transportation, if possible.  If you can, wear a mask if you must go out of the house or if you are in close contact with someone who is not sick. Keep your home clean  Disinfect objects and surfaces that are frequently touched every day. This may include: ? Counters and tables. ? Doorknobs and light switches. ? Sinks and faucets. ? Electronics such as phones, remote controls, keyboards, computers, and tablets.  Wash dishes in hot, soapy water or use a dishwasher. Air-dry your dishes.  Wash laundry in hot water. Prevent infecting other household members  Let healthy household members care for children and pets, if possible. If you have to care for children or pets, wash your hands often and wear a mask.  Sleep in a different bedroom or bed, if possible.  Do not share personal items, such as razors, toothbrushes, deodorant, combs, brushes, towels, and washcloths. Where to find more information Centers for Disease Control and Prevention (CDC)  Information and news updates: https://www.butler-gonzalez.com/ World Health Organization [facility])  Information and news updates: MissExecutive.com.ee  Coronavirus health topic: https://www.castaneda.info/  Questions and answers on COVID-19: OpportunityDebt.at  Global tracker: who.sprinklr.com American Academy of Pediatrics (AAP)  Information for families:  www.healthychildren.org/English/health-issues/conditions/chest-lungs/Pages/2019-Novel-Coronavirus.aspx The coronavirus situation is changing rapidly. Check your local health authority website or the CDC and [facility] websites for updates and news. When should I contact a health care provider?  Contact your health care provider if you have symptoms of an infection, such as fever or cough, and you: ? Have been near anyone who is known to have coronavirus disease. ? Have come into contact with a person who is suspected to have coronavirus disease. ? Have traveled outside of the country. When should I get emergency medical care?  Get help right away by calling your local emergency services (911 in the [place] if you have: ? Trouble breathing. ? Pain or pressure in your chest. ? Confusion. ? Blue-tinged lips and fingernails. ? Difficulty waking from sleep. ? Symptoms that get worse. Let the emergency medical personnel know if you think you have coronavirus disease. Summary  A new respiratory virus is spreading from person to person and causing COVID-19 (coronavirus disease).  The virus that causes COVID-19 appears to spread easily. It spreads from one person to another through droplets from coughing or sneezing.  Older adults and those with chronic diseases are at higher risk of disease. If you are at higher risk for complications, take extra precautions.  There is currently no vaccine to prevent coronavirus disease. There are no medicines, such as antibiotics or antivirals, to treat the virus.  You can protect yourself and your family by washing your hands often, avoiding touching your face, and covering your coughs and sneezes. This information is not intended to replace advice given to you by your health care provider. Make sure you discuss any questions you have with your health care provider. Document Released: 07/22/2018 Document Revised: 07/22/2018 Document Reviewed: 07/22/2018 Elsevier  Patient Education  2020 [employer]: How to  Protect Yourself and Others Know how it spreads  There is currently no vaccine to prevent coronavirus disease 2019 (COVID-19).  The best way to prevent illness is to avoid being exposed to this virus.  The virus is thought to spread mainly from person-to-person. ? Between people who are in close contact with one another (within about 6 feet). ? Through respiratory droplets produced when an infected person coughs, sneezes or talks. ? These droplets can land in the mouths or noses of people who are nearby or possibly be inhaled into the lungs. ? Some recent studies have suggested that COVID-19 may be spread by people who are not showing symptoms. Everyone should Clean your hands often  Wash your hands often with soap and water for at least 20 seconds especially after you have been in a public place, or after blowing your nose, coughing, or sneezing.  If soap and water are not readily available, use a hand sanitizer that contains at least 60% alcohol. Cover all surfaces of your hands and rub them together until they feel dry.  Avoid touching your eyes, nose, and mouth with unwashed hands. Avoid close contact  Stay home if you are sick.  Avoid close contact with people who are sick.  Put distance between yourself and other people. ? Remember that some people without symptoms may be able to spread virus. ? This is especially important for people who are at higher risk of getting very GainPain.com.cy Cover your mouth and nose with a cloth face cover when around others  You could spread COVID-19 to others even if you do not feel sick.  Everyone should wear a cloth face cover when they have to go out in public, for example to the grocery store or to pick up other necessities. ? Cloth face coverings should not be placed on young children under age 42, anyone  who has trouble breathing, or is unconscious, incapacitated or otherwise unable to remove the mask without assistance.  The cloth face cover is meant to protect other people in case you are infected.  Do NOT use a facemask meant for a Dietitian.  Continue to keep about 6 feet between yourself and others. The cloth face cover is not a substitute for social distancing. Cover coughs and sneezes  If you are in a private setting and do not have on your cloth face covering, remember to always cover your mouth and nose with a tissue when you cough or sneeze or use the inside of your elbow.  Throw used tissues in the trash.  Immediately wash your hands with soap and water for at least 20 seconds. If soap and water are not readily available, clean your hands with a hand sanitizer that contains at least 60% alcohol. Clean and disinfect  Clean AND disinfect frequently touched surfaces daily. This includes tables, doorknobs, light switches, countertops, handles, desks, phones, keyboards, toilets, faucets, and sinks. RackRewards.fr  If surfaces are dirty, clean them: Use detergent or soap and water prior to disinfection.  Then, use a household disinfectant. You can see a list of EPA-registered household disinfectants here. [email or web address] 08/12/2018 This information is not intended to replace advice given to you by your health care provider. Make sure you discuss any questions you have with your health care provider. Document Released: 07/22/2018 Document Revised: 08/20/2018 Document Reviewed: 07/22/2018 Elsevier Patient Education  [employer].

## 2019-01-19 NOTE — Progress Notes (Signed)
Occupational Therapy Treatment Patient Details Name: LESLEE SUIRE MRN: [id number] DOB: 04-10-1955 Today's Date: 01/19/2019    History of present illness 63yo with a history of COPD, DM 2, HTN, HLD, hepatomegaly, and obesity who presented to the ED 4 days after a positive COVID test in the outpatient setting with complaints of shortness of breath.  She was found to be hypoxic at 86% on room air with a chest x-ray noting bibasilar infiltrates.   OT comments  Pt seated in recliner upon arrival, having completed dressing ADL in preparation for d/c home today. Session focus on review of energy conservation techniques during functional tasks and activity progression after return home. Pt verbalizing understanding and asking questions appropriately throughout, corresponding handout issued in previous OT session and referenced PRN. Pt anticipating d/c home today.    Follow Up Recommendations  Supervision/Assistance - 24 hour;Home health OT;No OT follow up    Equipment Recommendations  None recommended by OT          Precautions / Restrictions Precautions Precautions: Fall Precaution Comments: monitor O2 Restrictions Weight Bearing Restrictions: No       Mobility Bed Mobility               General bed mobility comments: received OOB in recliner  Transfers Overall transfer level: Modified independent                        ADL either performed or assessed with clinical judgement   ADL Overall ADL's : Needs assistance/impaired                                   Tub/Shower Transfer Details (indicate cue type and reason): reinforced use of shower chair for conserving energy during ADL task   General ADL Comments: session focus on energy conservation and activity progression after return home; pt dressed upon arrival and anticipating d/c home today                       Cognition Arousal/Alertness: Awake/alert Behavior During Therapy: WFL  for tasks assessed/performed Overall Cognitive Status: Within Functional Limits for tasks assessed                                 General Comments: very pleasant        Exercises     Shoulder Instructions       General Comments      Pertinent Vitals/ Pain       Pain Assessment: No/denies pain  Home Living                                          Prior Functioning/Environment              Frequency  Min 3X/week        Progress Toward Goals  OT Goals(current goals can now be found in the care plan section)  Progress towards OT goals: Progressing toward goals  Acute Rehab OT Goals Patient Stated Goal: return home when ready, no more panic attacks OT Goal Formulation: With patient Time For Goal Achievement: 01/29/19 Potential to Achieve Goals: Good  Plan Discharge plan remains appropriate    Co-evaluation  AM-PAC OT "6 Clicks" Daily Activity     Outcome Measure   Help from another person eating meals?: None Help from another person taking care of personal grooming?: None Help from another person toileting, which includes using toliet, bedpan, or urinal?: None Help from another person bathing (including washing, rinsing, drying)?: A Little Help from another person to put on and taking off regular upper body clothing?: None Help from another person to put on and taking off regular lower body clothing?: A Little 6 Click Score: 22    End of Session Equipment Utilized During Treatment: Oxygen  OT Visit Diagnosis: Muscle weakness (generalized) (M62.81);Other (comment)(decreased activity tolerance)   Activity Tolerance Patient tolerated treatment well   Patient Left in chair;with call bell/phone within reach   Nurse Communication Mobility status        Time: 7639-4320 OT Time Calculation (min): 10 min  Charges: OT General Charges $OT Visit: 1 Visit OT Treatments $Self Care/Home Management :  8-22 mins  Lou Cal, OT [facility] Pager [phone] Office [phone]    Raymondo Band 01/19/2019, 4:08 PM

## 2019-01-19 NOTE — Discharge Summary (Signed)
DISCHARGE SUMMARY  Amanda Wiley  MR#: [id number]  DOB:03-19-1956  Date of Admission: 01/12/2019 Date of Discharge: 01/19/2019  Attending Physician:Aya Geisel Hennie Duos, MD  Patient's RKY:HCWCB, Randell Patient, MD  Consults: None  Disposition: Discharge home  Follow-up Appts: Follow-up Information    Einar Pheasant, MD Follow up in 5 day(s).   Specialty: Internal Medicine Contact information: [address] [place] [address] [phone]           Tests Needing Follow-up: -Assess CBG control and potential need to adjust therapy -Assess ability to wean oxygen further -Follow-up on hepatic steatosis incidentally noted during this hospital stay  Discharge Diagnoses: COVID pneumonia  Acute hypoxic respiratory failure DM 2 COPD HTN Hepatic steatosis / hepatomegaly Thrombocytopenia History of polycythemia vera Morbid obesity - Body mass index is 31.7 kg/m.   Initial presentation: 63yo with a history of COPD, DM 2, HTN, HLD, hepatomegaly, and obesity who presented to the ED 4 days after a positive COVID test in the outpatient setting with complaints of shortness of breath.  She was found to be hypoxic at 86% on room air with a chest x-ray noting bibasilar infiltrates.  COVID-19 specific Treatment: Remdesivir 10/5 > 10/9 Decadron 10/5 > 10/12 Convalescent plasma 10/6  Hospital Course: 10/1 SARS-CoV-2 test positive 10/5 admit to [facility] via [facility] ED 10/12 discharge home  COVID pneumonia - acute hypoxic respiratory failure received convalescent plasma -completed a course of remdesivir -discontinue Decadron at time of discharge given severe hyperglycemia and significant improve respiratory status - continue to wean oxygen as able in the outpatient setting - ambulate -continue to use incentive spirometer at home -continues to require oxygen at time of discharge -Home oxygen has been arranged -follow-up with PCP to determine if patient has  reached a point weaning of oxygen as able to be accomplished  DM 2 A1c 7.3 - added linagliptin as there is some recent evidence suggesting this may in fact also provide a benefit in patients with COVID -continue this after discharge home -discontinue Decadron at time of discharge in hopes of avoiding insulin use at home -resume usual home diabetes management with addition of linagliptin -follow CBGs closely and report to primary care physician  COPD Well compensated during this hospital stay with no wheezing  HTN Blood pressure well controlled at time of discharge  Hepatic steatosis / hepatomegaly Noted on recent right upper quadrant ultrasound (12/22/2018) - weight loss advised -will need follow-up as outpatient  Thrombocytopenia Platelet count has normalized prior to discharge  History of polycythemia vera Baseline hemoglobin 14.5-15.7 -hemoglobin stable at this time  Morbid obesity - Estimated body mass index is 31.7 kg/m as calculated from the following:   Height as of this encounter: 5' 5"  (1.651 m).   Weight as of this encounter: 86.4 kg.   Allergies as of 01/19/2019      Reactions   Avelox [moxifloxacin Hcl In Nacl] Hives, Shortness Of Breath, Other (See Comments)   Moxifloxacin Hives, Shortness Of Breath   Allegra [fexofenadine Hcl] Other (See Comments)   headaches   Ibuprofen Swelling   Sulfa Antibiotics Other (See Comments)   Difficulty breathing, rash, tongue swelling   Tegretol [carbamazepine] Other (See Comments)   Other reaction(s): Other (See Comments) Vomiting Vomiting   Iodine Rash   Ioxaglate Rash   Ivp Dye [iodinated Diagnostic Agents] Rash      Medication List    STOP taking these medications   azithromycin 250 MG tablet Commonly known as: ZITHROMAX   methylPREDNISolone  4 MG Tbpk tablet Commonly known as: MEDROL DOSEPAK   predniSONE 10 MG tablet Commonly known as: DELTASONE     TAKE these medications   acetaminophen 325 MG  tablet Commonly known as: TYLENOL Take 2 tablets (650 mg total) by mouth every 6 (six) hours as needed for mild pain or headache (fever >/= 101).   albuterol 108 (90 Base) MCG/ACT inhaler Commonly known as: VENTOLIN HFA Inhale 2 puffs into the lungs every 6 (six) hours as needed.   ALPRAZolam 0.25 MG tablet Commonly known as: XANAX Take 1 tablet (0.25 mg total) by mouth daily as needed for anxiety.   amLODipine 5 MG tablet Commonly known as: NORVASC Take 1 tablet (5 mg total) by mouth daily.   buPROPion 300 MG 24 hr tablet Commonly known as: WELLBUTRIN XL Take 1 tablet (300 mg total) by mouth daily.   fluticasone 50 MCG/ACT nasal spray Commonly known as: FLONASE Place 2 sprays into both nostrils daily.   fluticasone-salmeterol 230-21 MCG/ACT inhaler Commonly known as: ADVAIR HFA Inhale 2 puffs into the lungs 2 (two) times daily.   furosemide 20 MG tablet Commonly known as: LASIX Take 1 tablet (20 mg total) by mouth daily.   hyoscyamine 0.125 MG SL tablet Commonly known as: LEVSIN SL Place 1 tablet (0.125 mg total) under the tongue 2 (two) times daily as needed.   Jardiance 25 MG Tabs tablet Generic drug: empagliflozin Take 25 mg by mouth daily.   linagliptin 5 MG Tabs tablet Commonly known as: TRADJENTA Take 1 tablet (5 mg total) by mouth daily. Start taking on: January 20, 2019   losartan 100 MG tablet Commonly known as: COZAAR Take 1 tablet (100 mg total) by mouth daily.   metFORMIN 500 MG tablet Commonly known as: GLUCOPHAGE 2 tablets bid What changed:   how much to take  how to take this  when to take this  additional instructions   metoprolol succinate 50 MG 24 hr tablet Commonly known as: TOPROL-XL Take 1 tablet (50 mg total) by mouth daily. Take with or immediately following a meal.   montelukast 10 MG tablet Commonly known as: SINGULAIR Take 1 tablet (10 mg total) by mouth daily.   RABEprazole 20 MG tablet Commonly known as: ACIPHEX Take  1 tablet (20 mg total) by mouth 2 (two) times daily before a meal.   Spiriva Respimat 2.5 MCG/ACT Aers Generic drug: Tiotropium Bromide Monohydrate USE 2 INHALATIONS DAILY What changed: See the new instructions.   Trulicity 1.5 WU/9.8JX Sopn Generic drug: Dulaglutide Inject 1.5 mg into the skin every 7 (seven) days.            Durable Medical Equipment  (From admission, onward)         Start     Ordered   01/19/19 1140  For home use only DME oxygen  Once    Question Answer Comment  Length of Need 6 Months   Mode or (Route) Nasal cannula   Liters per Minute 5   Frequency Continuous (stationary and portable oxygen unit needed)   Oxygen conserving device Yes   Oxygen delivery system Gas      01/19/19 1140          Day of Discharge BP (!) 112/49 (BP Location: Left Arm)    Pulse 75    Temp 98.2 F (36.8 C) (Oral)    Resp 16    Ht 5' 5"  (1.651 m)    Wt 86.4 kg    SpO2 90%  BMI 31.70 kg/m   Physical Exam: General: No acute respiratory distress Lungs: Mild persistent diffuse crackles Cardiovascular: Regular rate and rhythm without murmur gallop or rub normal S1 and S2 Abdomen: Nontender, nondistended, soft, bowel sounds positive, no rebound, no ascites, no appreciable mass Extremities: No significant cyanosis, clubbing, or edema bilateral lower extremities  Basic Metabolic Panel: Recent Labs  Lab 01/13/19 0422 01/14/19 0448 01/15/19 0230 01/16/19 0130 01/19/19 0340  NA 140 141 140 140 140  K 4.4 4.7 4.0 3.7 3.9  CL 106 103 102 101 100  CO2 22 22 23 23 26   GLUCOSE 252* 249* 170* 159* 154*  BUN 26* 26* 29* 31* 32*  CREATININE 0.89 0.92 0.79 0.98 1.01*  CALCIUM 8.1* 8.8* 8.5* 8.7* 8.8*    Liver Function Tests: Recent Labs  Lab 01/13/19 0422 01/14/19 0448 01/15/19 0230 01/16/19 0130 01/19/19 0340  AST 27 30 21  34 22  ALT 45* 42 33 47* 40  ALKPHOS 45 45 42 48 42  BILITOT 0.6 0.8 0.5 0.9 1.1  PROT 6.6 7.2 6.6 7.1 6.5  ALBUMIN 3.1* 3.4* 3.1* 3.3*  2.8*   CBC: Recent Labs  Lab 01/12/19 1400 01/13/19 0422 01/14/19 0448 01/15/19 0230 01/16/19 0130  WBC 6.2 4.3 4.8 8.0 11.4*  NEUTROABS 5.3 3.2 3.8 5.7 7.7  HGB 15.3* 14.5 15.2* 14.7 15.9*  HCT 46.0 45.3 47.3* 46.0 49.4*  MCV 87.0 89.5 90.4 90.0 89.0  PLT 137* 138* 141* 208 250    CBG: Recent Labs  Lab 01/18/19 1107 01/18/19 1540 01/18/19 2215 01/19/19 0743 01/19/19 1132  GLUCAP 271* 227* 218* 143* 365*    Recent Results (from the past 240 hour(s))  Blood Culture (routine x 2)     Status: None   Collection Time: 01/12/19  2:00 PM   Specimen: BLOOD  Result Value Ref Range Status   Specimen Description BLOOD BLOOD RIGHT HAND  Final   Special Requests   Final    BOTTLES DRAWN AEROBIC AND ANAEROBIC Blood Culture results may not be optimal due to an inadequate volume of blood received in culture bottles   Culture   Final    NO GROWTH 5 DAYS Performed at [facility], [address] [place], [place] [address]    Report Status 01/17/2019 FINAL  Final  Blood Culture (routine x 2)     Status: None   Collection Time: 01/12/19  2:00 PM   Specimen: BLOOD  Result Value Ref Range Status   Specimen Description BLOOD RIGHT ANTECUBITAL  Final   Special Requests   Final    BOTTLES DRAWN AEROBIC AND ANAEROBIC Blood Culture adequate volume   Culture   Final    NO GROWTH 5 DAYS Performed at [facility], [address] [place], [place] [address]    Report Status 01/17/2019 FINAL  Final     Time spent in discharge (includes decision making & examination of pt): 35 minutes  01/19/2019, 1:58 PM   Cherene Altes, MD [facility] Office  [phone]

## 2019-01-19 NOTE — Plan of Care (Signed)
  Problem: Education: Goal: Knowledge of risk factors and measures for prevention of condition will improve Outcome: Progressing   Problem: Coping: Goal: Psychosocial and spiritual needs will be supported Outcome: Progressing   Problem: Respiratory: Goal: Will maintain a patent airway Outcome: Progressing Goal: Complications related to the disease process, condition or treatment will be avoided or minimized Outcome: Progressing   

## 2019-01-20 ENCOUNTER — Telehealth: Payer: Self-pay

## 2019-01-20 NOTE — Telephone Encounter (Signed)
Copied from [place] [id number]. Topic: General - Inquiry >> Jan 20, 2019 11:25 AM Percell Belt A wrote: Reason for CRM: pt called in and stated she was just release from the hosp last night , she stated she would like to talk to Dr Nicki Reaper nurse.  She wanted to speak with Nurse before she made an hosp fu with Dr Nicki Reaper.  She wants to know what she needs to be doing?   Best number  [phone]

## 2019-01-20 NOTE — Telephone Encounter (Signed)
Late entry:  Spoke with pt today at 32.  Discussed my concern regarding her low oxygen saturations despite 5L O2.  SOB with walking to bathroom and back to her bed.  Discussed my concern and discussed going back to hospital.  She declines.  States she feels better today than she has in a while.  Had decreased her oxygen to 4LNC.  Pulse ox 88%.  She turned her oxygen 5LNC and rested.  Pulse ox 90%.  Feels her breathing is better.  Declines evaluation.  Will call with update.  Has f/u appt with me at end of week.  Will be reevaluated earlier if any worsening change or problems.

## 2019-01-20 NOTE — Telephone Encounter (Signed)
Patient pulse OX is 88 on 5 liters. Patient said the hospital said ws nothing else they could do for her , on Friday. Said she is trying to get her self back on her diabetic regimen.  Concerned patient 02 sats on 5L 02 at 88% spoke with PCP and PCP called and is on phone with patient.

## 2019-01-20 NOTE — Telephone Encounter (Signed)
Transition Care Management Follow-up Telephone Call   Date discharged? 01/19/19   How have you been since you were released from the hospital? Oxygen registers 88-90 with 5L . Blood sugars 250 due to high carb diet in hospital. She plans to call Endocrinology at close of this call for follow up.    Do you understand why you were in the hospital? Yes, Covid-19 and pneumonia.   Do you understand the discharge instructions? Yes, take routine medications.    Where were you discharged to? Home.   Items Reviewed:  Medications reviewed: Yes, stop prednisone, medrol dosepak, zithromax. Take all other scheduled medications.   Allergies reviewed: Yes, none new.  Dietary changes reviewed: Yes, diabetic diet.   Referrals reviewed: Yes, none new.    Functional Questionnaire:   Activities of Daily Living (ADLs):   She states they are independent in the following: Independent in all ADLs. States they require assistance with the following: No assistance required. She paces herself.    Any transportation issues/concerns?: None at this time.   Any patient concerns? None at this time.    Confirmed importance and date/time of follow-up visits scheduled Yes, doxy [phone] scheduled 01/23/19 @ 1030.   Provider Appointment booked with Dr. Nicki Reaper, pcp.  Confirmed with patient if condition begins to worsen call PCP or go to the ER.  Patient was given the office number and encouraged to call back with question or concerns.  : Yes.

## 2019-01-22 NOTE — Telephone Encounter (Signed)
Spoke with the pt and notified of recs per Dr Mortimer Fries  She verbalized understanding  She states has plenty of her albuterol HFA  Nothing further needed

## 2019-01-23 ENCOUNTER — Other Ambulatory Visit: Payer: Self-pay

## 2019-01-23 ENCOUNTER — Ambulatory Visit (INDEPENDENT_AMBULATORY_CARE_PROVIDER_SITE_OTHER): Admitting: Internal Medicine

## 2019-01-23 ENCOUNTER — Encounter: Payer: Self-pay | Admitting: Internal Medicine

## 2019-01-23 DIAGNOSIS — E1165 Type 2 diabetes mellitus with hyperglycemia: Secondary | ICD-10-CM

## 2019-01-23 DIAGNOSIS — K219 Gastro-esophageal reflux disease without esophagitis: Secondary | ICD-10-CM

## 2019-01-23 DIAGNOSIS — R945 Abnormal results of liver function studies: Secondary | ICD-10-CM | POA: Diagnosis not present

## 2019-01-23 DIAGNOSIS — D45 Polycythemia vera: Secondary | ICD-10-CM

## 2019-01-23 DIAGNOSIS — F329 Major depressive disorder, single episode, unspecified: Secondary | ICD-10-CM

## 2019-01-23 DIAGNOSIS — I10 Essential (primary) hypertension: Secondary | ICD-10-CM

## 2019-01-23 DIAGNOSIS — U071 COVID-19: Secondary | ICD-10-CM

## 2019-01-23 DIAGNOSIS — F419 Anxiety disorder, unspecified: Secondary | ICD-10-CM

## 2019-01-23 DIAGNOSIS — K5090 Crohn's disease, unspecified, without complications: Secondary | ICD-10-CM | POA: Diagnosis not present

## 2019-01-23 DIAGNOSIS — J9600 Acute respiratory failure, unspecified whether with hypoxia or hypercapnia: Secondary | ICD-10-CM

## 2019-01-23 DIAGNOSIS — R7989 Other specified abnormal findings of blood chemistry: Secondary | ICD-10-CM

## 2019-01-23 DIAGNOSIS — E7800 Pure hypercholesterolemia, unspecified: Secondary | ICD-10-CM

## 2019-01-23 MED ORDER — BENZONATATE 100 MG PO CAPS: 100.0000 mg | ORAL_CAPSULE | Freq: Three times a day (TID) | ORAL | 0 refills | Status: DC | PRN

## 2019-01-23 NOTE — Progress Notes (Signed)
[name], female   DOB: [birth year], 63 y.o.   MRN: [id number]   Virtual Visit via video Note  This visit type was conducted due to national recommendations for restrictions regarding the COVID-19 pandemic (e.g. social distancing).  This format is felt to be most appropriate for this patient at this time.  All issues noted in this document were discussed and addressed.  No physical exam was performed (except for noted visual exam findings with Video Visits).   I connected with Amanda Wiley by a video enabled telemedicine application and verified that I am speaking with the correct person using two identifiers. Location patient: home Location provider: work  Persons participating in the virtual visit: patient, provider  I discussed the limitations, risks, security and privacy concerns of performing an evaluation and management service by video and the availability of in person appointments.  The patient expressed understanding and agreed to proceed.   Reason for visit: hospital follow up.   HPI: She was admitted 10/5-10/12/20 with covid.  Diagnosed with COVID pneumonia - acute hypoxic respiratory failure.  She received convalescent plasma and completed a course of remdesivir.  Was also treated with steroids while in hospital.  Discharged on oxygen - 5LNC.  She is using incentive spirometer.  Reports persistent sob and cough.  SOB with exertion.  Still requiring 5L O2.  She does feel better.  Breathing is better.  No fever.  Eating.  Appetite is improving.  No chest pain.  No acid reflux.  No nausea or vomiting.  No abdominal pain.  Bowels moving.     ROS: See pertinent positives and negatives per HPI.  Past Medical History:  Diagnosis Date  . COPD (chronic obstructive pulmonary disease) ([place])   . Crohn's disease ([place])   . Depression   . Diabetes mellitus ([place])   . Fracture, foot    post fall  . Hypercholesterolemia   . Hypertension   . IBS (irritable bowel syndrome)    . Nephrolithiasis    s/p lithotripsy [facility])    Past Surgical History:  Procedure Laterality Date  . ABDOMINAL HYSTERECTOMY  1985  . APPENDECTOMY  1985  . BREAST EXCISIONAL BIOPSY Right 2013   benign  . COLONOSCOPY  2014  . LAPAROSCOPIC CHOLECYSTECTOMY  2005   Dr Tamala Julian  . LAPAROSCOPIC SUPRACERVICAL HYSTERECTOMY  1985   left ovary not removed  . LITHOTRIPSY     x7  . OOPHORECTOMY    . POLYPECTOMY    . SHOULDER SURGERY     right (pinning)  . SHOULDER SURGERY     left (pinning)  . TONSILLECTOMY  1966    Family History  Problem Relation Age of Onset  . Heart disease Father   . Emphysema Maternal Grandmother   . COPD Mother   . Hypercholesterolemia Mother   . Emphysema Mother   . Hypertension Brother   . Emphysema Other        grandmother  . Breast cancer Neg Hx   . Colon cancer Neg Hx     SOCIAL HX: reviewed.    Current Outpatient Medications:  .  acetaminophen (TYLENOL) 325 MG tablet, Take 2 tablets (650 mg total) by mouth every 6 (six) hours as needed for mild pain or headache (fever >/= 101)., Disp:  , Rfl:  .  albuterol (PROVENTIL HFA;VENTOLIN HFA) 108 (90 Base) MCG/ACT inhaler, Inhale 2 puffs into the lungs every 6 (six) hours as needed., Disp: 54 g, Rfl: 3 .  ALPRAZolam Duanne Moron)  0.25 MG tablet, Take 1 tablet (0.25 mg total) by mouth daily as needed for anxiety., Disp: 30 tablet, Rfl: 0 .  amLODipine (NORVASC) 5 MG tablet, Take 1 tablet (5 mg total) by mouth daily., Disp: 90 tablet, Rfl: 3 .  benzonatate (TESSALON) 100 MG capsule, Take 1 capsule (100 mg total) by mouth 3 (three) times daily as needed for cough., Disp: 21 capsule, Rfl: 0 .  buPROPion (WELLBUTRIN XL) 300 MG 24 hr tablet, Take 1 tablet (300 mg total) by mouth daily., Disp: 90 tablet, Rfl: 3 .  Dulaglutide (TRULICITY) 1.5 XN/2.3FT SOPN, Inject 1.5 mg into the skin every 7 (seven) days. , Disp: , Rfl:  .  empagliflozin (JARDIANCE) 25 MG TABS tablet, Take 25 mg by mouth daily. , Disp: ,  Rfl:  .  fluticasone (FLONASE) 50 MCG/ACT nasal spray, Place 2 sprays into both nostrils daily., Disp: 48 g, Rfl: 3 .  fluticasone-salmeterol (ADVAIR HFA) 230-21 MCG/ACT inhaler, Inhale 2 puffs into the lungs 2 (two) times daily., Disp: 3 Inhaler, Rfl: 3 .  furosemide (LASIX) 20 MG tablet, Take 1 tablet (20 mg total) by mouth daily., Disp: 90 tablet, Rfl: 3 .  hyoscyamine (LEVSIN SL) 0.125 MG SL tablet, Place 1 tablet (0.125 mg total) under the tongue 2 (two) times daily as needed., Disp: 30 tablet, Rfl: 0 .  losartan (COZAAR) 100 MG tablet, Take 1 tablet (100 mg total) by mouth daily., Disp: 90 tablet, Rfl: 3 .  metFORMIN (GLUCOPHAGE) 500 MG tablet, 2 tablets bid (Patient taking differently: Take 1,000 mg by mouth 2 (two) times daily with a meal. ), Disp: 360 tablet, Rfl: 3 .  metoprolol succinate (TOPROL-XL) 50 MG 24 hr tablet, Take 1 tablet (50 mg total) by mouth daily. Take with or immediately following a meal., Disp: 90 tablet, Rfl: 3 .  montelukast (SINGULAIR) 10 MG tablet, Take 1 tablet (10 mg total) by mouth daily., Disp: 90 tablet, Rfl: 3 .  RABEprazole (ACIPHEX) 20 MG tablet, Take 1 tablet (20 mg total) by mouth 2 (two) times daily before a meal., Disp: 180 tablet, Rfl: 3 .  SPIRIVA RESPIMAT 2.5 MCG/ACT AERS, USE 2 INHALATIONS DAILY (Patient taking differently: Take 2 puffs by mouth daily. ), Disp: 12 g, Rfl: 4  EXAM:  VITALS per patient if applicable:  732/20, 72  GENERAL: alert, oriented, appears well and in no acute distress  HEENT: atraumatic, conjunttiva clear, no obvious abnormalities on inspection of external nose and ears  NECK: normal movements of the head and neck  LUNGS: on inspection no signs of respiratory distress,  no obvious gasping or wheezing  CV: no obvious cyanosis  PSYCH/NEURO: pleasant and cooperative, no obvious depression or anxiety, speech and thought processing grossly intact  ASSESSMENT AND PLAN:  Discussed the following assessment and plan:   Abnormal liver function tests Elevated during recent hospitalization.  Follow liver panel.    Acute respiratory failure due to COVID-19 [facility]) Recently admitted with covid pneumonia.  On oxygen.  Unable to wean at this time.  Continue incentive spirometer.  Tessalon perles for cough.  Continue inhalers.  Breathing has improved.  Follow closely.    Anxiety and depression Stable on wellbutrin.   Crohn's disease Bowels stable.  Continue f/u with GI.   Diabetes mellitus ([place]) Sugars starting to trend down.  Off steroids.  Low carb diet and exercise.  Follow met b and a1c.    Essential (primary) hypertension Blood pressure has been under good control.  Follow pressures.  Follow  metabolic panel.   GERD (gastroesophageal reflux disease) Controlled.  Follow.    Hypercholesterolemia without hypertriglyceridemia Off crestor.  Low cholesterol diet and exercise.  Follow lipid panel.    Polycythemia vera ([place]) Has seen hematology.  Released.  Follow cbc.     I discussed the assessment and treatment plan with the patient. The patient was provided an opportunity to ask questions and all were answered. The patient agreed with the plan and demonstrated an understanding of the instructions.   The patient was advised to call back or seek an in-person evaluation if the symptoms worsen or if the condition fails to improve as anticipated.   Einar Pheasant, MD

## 2019-01-28 NOTE — Assessment & Plan Note (Signed)
Has seen hematology.  Released.  Follow cbc.

## 2019-01-28 NOTE — Assessment & Plan Note (Signed)
Bowels stable.  Continue f/u with GI.

## 2019-01-28 NOTE — Assessment & Plan Note (Signed)
Stable on wellbutrin.

## 2019-01-28 NOTE — Assessment & Plan Note (Signed)
Controlled.  Follow.

## 2019-01-28 NOTE — Assessment & Plan Note (Signed)
Recently admitted with covid pneumonia.  On oxygen.  Unable to wean at this time.  Continue incentive spirometer.  Tessalon perles for cough.  Continue inhalers.  Breathing has improved.  Follow closely.

## 2019-01-28 NOTE — Assessment & Plan Note (Signed)
Off crestor.  Low cholesterol diet and exercise.  Follow lipid panel.

## 2019-01-28 NOTE — Assessment & Plan Note (Signed)
Sugars starting to trend down.  Off steroids.  Low carb diet and exercise.  Follow met b and a1c.

## 2019-01-28 NOTE — Assessment & Plan Note (Signed)
Elevated during recent hospitalization.  Follow liver panel.

## 2019-01-28 NOTE — Assessment & Plan Note (Signed)
Blood pressure has been under good control.  Follow pressures.  Follow metabolic panel.

## 2019-01-30 ENCOUNTER — Other Ambulatory Visit: Payer: Self-pay | Admitting: *Deleted

## 2019-01-30 ENCOUNTER — Other Ambulatory Visit: Payer: Self-pay | Admitting: Internal Medicine

## 2019-01-30 MED ORDER — ALBUTEROL SULFATE (2.5 MG/3ML) 0.083% IN NEBU: 2.5000 mg | INHALATION_SOLUTION | Freq: Four times a day (QID) | RESPIRATORY_TRACT | 1 refills | Status: DC | PRN

## 2019-01-30 MED ORDER — ALBUTEROL SULFATE HFA 108 (90 BASE) MCG/ACT IN AERS: 2.0000 | INHALATION_SPRAY | Freq: Four times a day (QID) | RESPIRATORY_TRACT | 3 refills | Status: DC | PRN

## 2019-01-30 NOTE — Telephone Encounter (Signed)
Patient says the nebulizer helps more than the inhaler, since has not been ordered since 2013 I wanted send for approval.I have pended the medication.

## 2019-01-30 NOTE — Telephone Encounter (Signed)
Copied from [place] [id number]. Topic: General - Other >> Jan 30, 2019  8:08 AM Leward Quan A wrote: Reason for CRM: Patient called to say that she was having so many issues with her breathing that she went on and used her nebulizer and after using it she was able to breathe then coughed up some very thick mucus. She is asking for a refill sent to the pharmacy for her today and a call back to let her know that it was sent. Please advise can be reached at  Ph#  [phone]

## 2019-01-30 NOTE — Telephone Encounter (Signed)
Copied from [place] [id number]. Topic: Quick Communication - Rx Refill/Question >> Jan 30, 2019  8:05 AM Leward Quan A wrote: Medication: ipratropium-albuterol (DUONEB) 0.5-2.5 (3) MG/3ML nebulizer solution 3 mL, albuterol (PROVENTIL) (2.5 MG/3ML) 0.083% nebulizer solution 2.5 mg    Has the patient contacted their pharmacy? Yes.   (Agent: If no, request that the patient contact the pharmacy for the refill.) (Agent: If yes, when and what did the pharmacy advise?)  Preferred Pharmacy (with phone number or street name): [place], [place]. [phone] (Phone) [phone] (Fax)    Agent: Please be advised that RX refills may take up to 3 business days. We ask that you follow-up with your pharmacy.

## 2019-01-30 NOTE — Telephone Encounter (Signed)
Requested medication (s) are due for refill today: yes  Requested medication (s) are on the active medication list: yes  Last refill:  04/2018  Future visit scheduled: yes  Notes to clinic: Patient also requesting Ipratropium-albuterol but not on current medication list  Review for refill   Requested Prescriptions  Pending Prescriptions Disp Refills   albuterol (VENTOLIN HFA) 108 (90 Base) MCG/ACT inhaler 54 g 3    Sig: Inhale 2 puffs into the lungs every 6 (six) hours as needed.     Pulmonology:  Beta Agonists Failed - 01/30/2019  8:12 AM      Failed - One inhaler should last at least one month. If the patient is requesting refills earlier, contact the patient to check for uncontrolled symptoms.      Passed - Valid encounter within last 12 months    Recent Outpatient Visits          1 week ago Abnormal liver function tests   [facility] [place], [place], MD   3 weeks ago Acute bronchitis with COPD [facility])   [facility]  Guse, Jacquelynn Cree, FNP   3 months ago Anxiety and depression   [place] Scott, [place], MD   7 months ago Gastroesophageal reflux disease, esophagitis presence not specified   [place] Scott, Randell Patient, MD   11 months ago Routine general medical examination at a health care facility   [facility], MD      Future Appointments            In 3 days Einar Pheasant, MD [place], [place]   In 3 weeks Einar Pheasant, MD [facility]

## 2019-01-30 NOTE — Telephone Encounter (Signed)
I have sent in the prescription for albuter neb - to [place].  Please confirm pt doing ok.

## 2019-02-02 ENCOUNTER — Other Ambulatory Visit: Payer: Self-pay

## 2019-02-02 ENCOUNTER — Encounter: Payer: Self-pay | Admitting: Emergency Medicine

## 2019-02-02 ENCOUNTER — Inpatient Hospital Stay

## 2019-02-02 ENCOUNTER — Ambulatory Visit (INDEPENDENT_AMBULATORY_CARE_PROVIDER_SITE_OTHER): Admitting: Internal Medicine

## 2019-02-02 ENCOUNTER — Emergency Department

## 2019-02-02 ENCOUNTER — Inpatient Hospital Stay
Admission: EM | Admit: 2019-02-02 | Discharge: 2019-02-03 | DRG: 177 | Disposition: A | Discharge status: Other Acute Inpatient Hospital | Attending: Internal Medicine | Admitting: Internal Medicine

## 2019-02-02 ENCOUNTER — Encounter: Payer: Self-pay | Admitting: Internal Medicine

## 2019-02-02 DIAGNOSIS — K5090 Crohn's disease, unspecified, without complications: Secondary | ICD-10-CM | POA: Diagnosis present

## 2019-02-02 DIAGNOSIS — Z7951 Long term (current) use of inhaled steroids: Secondary | ICD-10-CM

## 2019-02-02 DIAGNOSIS — I248 Other forms of acute ischemic heart disease: Secondary | ICD-10-CM | POA: Diagnosis present

## 2019-02-02 DIAGNOSIS — Z888 Allergy status to other drugs, medicaments and biological substances status: Secondary | ICD-10-CM | POA: Diagnosis not present

## 2019-02-02 DIAGNOSIS — Z79899 Other long term (current) drug therapy: Secondary | ICD-10-CM

## 2019-02-02 DIAGNOSIS — K219 Gastro-esophageal reflux disease without esophagitis: Secondary | ICD-10-CM | POA: Diagnosis present

## 2019-02-02 DIAGNOSIS — Z882 Allergy status to sulfonamides status: Secondary | ICD-10-CM

## 2019-02-02 DIAGNOSIS — Z90711 Acquired absence of uterus with remaining cervical stump: Secondary | ICD-10-CM

## 2019-02-02 DIAGNOSIS — Z9981 Dependence on supplemental oxygen: Secondary | ICD-10-CM

## 2019-02-02 DIAGNOSIS — E1165 Type 2 diabetes mellitus with hyperglycemia: Secondary | ICD-10-CM | POA: Diagnosis not present

## 2019-02-02 DIAGNOSIS — Z66 Do not resuscitate: Secondary | ICD-10-CM | POA: Diagnosis present

## 2019-02-02 DIAGNOSIS — J810 Acute pulmonary edema: Secondary | ICD-10-CM | POA: Diagnosis not present

## 2019-02-02 DIAGNOSIS — J9601 Acute respiratory failure with hypoxia: Secondary | ICD-10-CM | POA: Diagnosis not present

## 2019-02-02 DIAGNOSIS — Z7984 Long term (current) use of oral hypoglycemic drugs: Secondary | ICD-10-CM

## 2019-02-02 DIAGNOSIS — Z886 Allergy status to analgesic agent status: Secondary | ICD-10-CM | POA: Diagnosis not present

## 2019-02-02 DIAGNOSIS — J431 Panlobular emphysema: Secondary | ICD-10-CM | POA: Diagnosis not present

## 2019-02-02 DIAGNOSIS — Z87442 Personal history of urinary calculi: Secondary | ICD-10-CM

## 2019-02-02 DIAGNOSIS — J440 Chronic obstructive pulmonary disease with acute lower respiratory infection: Secondary | ICD-10-CM | POA: Diagnosis present

## 2019-02-02 DIAGNOSIS — U071 COVID-19: Principal | ICD-10-CM | POA: Diagnosis present

## 2019-02-02 DIAGNOSIS — Z8349 Family history of other endocrine, nutritional and metabolic diseases: Secondary | ICD-10-CM

## 2019-02-02 DIAGNOSIS — J80 Acute respiratory distress syndrome: Secondary | ICD-10-CM | POA: Diagnosis present

## 2019-02-02 DIAGNOSIS — I10 Essential (primary) hypertension: Secondary | ICD-10-CM | POA: Diagnosis present

## 2019-02-02 DIAGNOSIS — E119 Type 2 diabetes mellitus without complications: Secondary | ICD-10-CM | POA: Diagnosis present

## 2019-02-02 DIAGNOSIS — Z825 Family history of asthma and other chronic lower respiratory diseases: Secondary | ICD-10-CM

## 2019-02-02 DIAGNOSIS — E669 Obesity, unspecified: Secondary | ICD-10-CM | POA: Diagnosis not present

## 2019-02-02 DIAGNOSIS — J9600 Acute respiratory failure, unspecified whether with hypoxia or hypercapnia: Secondary | ICD-10-CM

## 2019-02-02 DIAGNOSIS — J069 Acute upper respiratory infection, unspecified: Secondary | ICD-10-CM | POA: Diagnosis not present

## 2019-02-02 DIAGNOSIS — Z7189 Other specified counseling: Secondary | ICD-10-CM | POA: Diagnosis not present

## 2019-02-02 DIAGNOSIS — R0602 Shortness of breath: Secondary | ICD-10-CM | POA: Diagnosis present

## 2019-02-02 DIAGNOSIS — Z87891 Personal history of nicotine dependence: Secondary | ICD-10-CM

## 2019-02-02 DIAGNOSIS — E785 Hyperlipidemia, unspecified: Secondary | ICD-10-CM | POA: Diagnosis present

## 2019-02-02 DIAGNOSIS — Z91041 Radiographic dye allergy status: Secondary | ICD-10-CM

## 2019-02-02 DIAGNOSIS — Z881 Allergy status to other antibiotic agents status: Secondary | ICD-10-CM

## 2019-02-02 DIAGNOSIS — F329 Major depressive disorder, single episode, unspecified: Secondary | ICD-10-CM | POA: Diagnosis present

## 2019-02-02 DIAGNOSIS — E1159 Type 2 diabetes mellitus with other circulatory complications: Secondary | ICD-10-CM | POA: Diagnosis not present

## 2019-02-02 DIAGNOSIS — J9621 Acute and chronic respiratory failure with hypoxia: Secondary | ICD-10-CM | POA: Diagnosis present

## 2019-02-02 DIAGNOSIS — Z8249 Family history of ischemic heart disease and other diseases of the circulatory system: Secondary | ICD-10-CM

## 2019-02-02 DIAGNOSIS — F419 Anxiety disorder, unspecified: Secondary | ICD-10-CM | POA: Diagnosis present

## 2019-02-02 DIAGNOSIS — Z9049 Acquired absence of other specified parts of digestive tract: Secondary | ICD-10-CM

## 2019-02-02 DIAGNOSIS — J189 Pneumonia, unspecified organism: Secondary | ICD-10-CM

## 2019-02-02 DIAGNOSIS — E1169 Type 2 diabetes mellitus with other specified complication: Secondary | ICD-10-CM | POA: Diagnosis not present

## 2019-02-02 DIAGNOSIS — J1289 Other viral pneumonia: Secondary | ICD-10-CM | POA: Diagnosis present

## 2019-02-02 DIAGNOSIS — J9690 Respiratory failure, unspecified, unspecified whether with hypoxia or hypercapnia: Secondary | ICD-10-CM | POA: Diagnosis present

## 2019-02-02 LAB — TROPONIN I (HIGH SENSITIVITY)
Troponin I (High Sensitivity): 21 ng/L — ABNORMAL HIGH (ref <18)
Troponin I (High Sensitivity): 19 ng/L — ABNORMAL HIGH (ref <18)

## 2019-02-02 LAB — URINALYSIS, ROUTINE W REFLEX MICROSCOPIC
Bilirubin Urine: NEGATIVE
Glucose, UA: >=500 mg/dL — AB
Hgb urine dipstick: NEGATIVE
Ketones, ur: NEGATIVE mg/dL
Leukocytes,Ua: NEGATIVE
Nitrite: NEGATIVE
Protein, ur: NEGATIVE mg/dL
Specific Gravity, Urine: 1.013 (ref 1.005–1.030)
pH: 5 (ref 5.0–8.0)

## 2019-02-02 LAB — COMPREHENSIVE METABOLIC PANEL
ALT: 18 U/L (ref 0–44)
AST: 22 U/L (ref 15–41)
Albumin: 2.5 g/dL — ABNORMAL LOW (ref 3.5–5.0)
Alkaline Phosphatase: 90 U/L (ref 38–126)
Anion gap: 15 (ref 5–15)
BUN: 10 mg/dL (ref 8–23)
CO2: 28 mmol/L (ref 22–32)
Calcium: 8.6 mg/dL — ABNORMAL LOW (ref 8.9–10.3)
Chloride: 100 mmol/L (ref 98–111)
Creatinine, Ser: 0.64 mg/dL (ref 0.44–1.00)
GFR calc Af Amer: >60 mL/min (ref >60)
GFR calc non Af Amer: >60 mL/min (ref >60)
Glucose, Bld: 260 mg/dL — ABNORMAL HIGH (ref 70–99)
Potassium: 3.9 mmol/L (ref 3.5–5.1)
Sodium: 143 mmol/L (ref 135–145)
Total Bilirubin: 0.7 mg/dL (ref 0.3–1.2)
Total Protein: 8 g/dL (ref 6.5–8.1)

## 2019-02-02 LAB — LACTIC ACID, PLASMA
Lactic Acid, Venous: 2 mmol/L (ref 0.5–1.9)
Lactic Acid, Venous: 1.5 mmol/L (ref 0.5–1.9)

## 2019-02-02 LAB — BLOOD GAS, ARTERIAL
Acid-Base Excess: 6.7 mmol/L — ABNORMAL HIGH (ref 0.0–2.0)
Bicarbonate: 30.4 mmol/L — ABNORMAL HIGH (ref 20.0–28.0)
FIO2: 1
O2 Saturation: 92.8 %
Patient temperature: 37
pCO2 arterial: 39 mmHg (ref 32.0–48.0)
pH, Arterial: 7.5 — ABNORMAL HIGH (ref 7.350–7.450)
pO2, Arterial: 60 mmHg — ABNORMAL LOW (ref 83.0–108.0)

## 2019-02-02 LAB — BRAIN NATRIURETIC PEPTIDE: B Natriuretic Peptide: 471 pg/mL — ABNORMAL HIGH (ref 0.0–100.0)

## 2019-02-02 LAB — CBC WITH DIFFERENTIAL/PLATELET
Abs Immature Granulocytes: 0.12 10*3/uL — ABNORMAL HIGH (ref 0.00–0.07)
Basophils Absolute: 0.1 10*3/uL (ref 0.0–0.1)
Basophils Relative: 1 %
Eosinophils Absolute: 0.2 10*3/uL (ref 0.0–0.5)
Eosinophils Relative: 2 %
HCT: 49.5 % — ABNORMAL HIGH (ref 36.0–46.0)
Hemoglobin: 15.2 g/dL — ABNORMAL HIGH (ref 12.0–15.0)
Immature Granulocytes: 1 %
Lymphocytes Relative: 12 %
Lymphs Abs: 1.1 10*3/uL (ref 0.7–4.0)
MCH: 28 pg (ref 26.0–34.0)
MCHC: 30.7 g/dL (ref 30.0–36.0)
MCV: 91.2 fL (ref 80.0–100.0)
Monocytes Absolute: 0.6 10*3/uL (ref 0.1–1.0)
Monocytes Relative: 7 %
Neutro Abs: 7.4 10*3/uL (ref 1.7–7.7)
Neutrophils Relative %: 77 %
Platelets: 279 10*3/uL (ref 150–400)
RBC: 5.43 MIL/uL — ABNORMAL HIGH (ref 3.87–5.11)
RDW: 16.4 % — ABNORMAL HIGH (ref 11.5–15.5)
WBC: 9.5 10*3/uL (ref 4.0–10.5)
nRBC: 0.7 % — ABNORMAL HIGH (ref 0.0–0.2)

## 2019-02-02 LAB — PROTIME-INR
INR: 1.1 (ref 0.8–1.2)
Prothrombin Time: 13.7 seconds (ref 11.4–15.2)

## 2019-02-02 LAB — BLOOD GAS, VENOUS
Patient temperature: 37
pCO2, Ven: 46 mmHg (ref 44.0–60.0)
pH, Ven: 7.45 — ABNORMAL HIGH (ref 7.250–7.430)
pO2, Ven: 47 mmHg — ABNORMAL HIGH (ref 32.0–45.0)

## 2019-02-02 LAB — APTT: aPTT: 32 seconds (ref 24–36)

## 2019-02-02 LAB — PROCALCITONIN: Procalcitonin: 0.14 ng/mL

## 2019-02-02 LAB — MAGNESIUM: Magnesium: 2.2 mg/dL (ref 1.7–2.4)

## 2019-02-02 LAB — SARS CORONAVIRUS 2 BY RT PCR (HOSPITAL ORDER, PERFORMED IN ~~LOC~~ HOSPITAL LAB): SARS Coronavirus 2: POSITIVE — AB

## 2019-02-02 LAB — FIBRIN DERIVATIVES D-DIMER (ARMC ONLY): Fibrin derivatives D-dimer (ARMC): 4814.42 ng/mL (FEU) — ABNORMAL HIGH (ref 0.00–499.00)

## 2019-02-02 MED ORDER — VANCOMYCIN HCL 10 G IV SOLR
2000.0000 mg | INTRAVENOUS | Status: DC
  Filled 2019-02-02: qty 2000

## 2019-02-02 MED ORDER — SODIUM CHLORIDE 0.9 % IV SOLN
2.0000 g | Freq: Three times a day (TID) | INTRAVENOUS | Status: DC
  Administered 2019-02-03 (×2): 2 g via INTRAVENOUS
  Filled 2019-02-02 (×3): qty 2

## 2019-02-02 MED ORDER — VANCOMYCIN HCL 10 G IV SOLR
2000.0000 mg | Freq: Once | INTRAVENOUS | Status: AC
  Administered 2019-02-02: 2000 mg via INTRAVENOUS
  Filled 2019-02-02: qty 2000

## 2019-02-02 MED ORDER — DIPHENHYDRAMINE HCL 25 MG PO CAPS
50.0000 mg | ORAL_CAPSULE | Freq: Once | ORAL | Status: AC
  Filled 2019-02-02: qty 2

## 2019-02-02 MED ORDER — SODIUM CHLORIDE 0.9 % IV SOLN
200.0000 mg | Freq: Once | INTRAVENOUS | Status: DC
  Filled 2019-02-02: qty 40

## 2019-02-02 MED ORDER — SODIUM CHLORIDE 0.9 % IV SOLN: 100.0000 mg | INTRAVENOUS | Status: DC

## 2019-02-02 MED ORDER — IPRATROPIUM-ALBUTEROL 0.5-2.5 (3) MG/3ML IN SOLN
3.0000 mL | Freq: Once | RESPIRATORY_TRACT | Status: AC
  Administered 2019-02-02: 3 mL via RESPIRATORY_TRACT
  Filled 2019-02-02: qty 3

## 2019-02-02 MED ORDER — VANCOMYCIN HCL IN DEXTROSE 1-5 GM/200ML-% IV SOLN
1000.0000 mg | Freq: Once | INTRAVENOUS | Status: DC
  Filled 2019-02-02: qty 200

## 2019-02-02 MED ORDER — IOHEXOL 350 MG/ML SOLN
75.0000 mL | Freq: Once | INTRAVENOUS | Status: AC | PRN
  Administered 2019-02-02: 75 mL via INTRAVENOUS

## 2019-02-02 MED ORDER — SODIUM CHLORIDE 0.9 % IV SOLN
2.0000 g | Freq: Once | INTRAVENOUS | Status: AC
  Administered 2019-02-02: 2 g via INTRAVENOUS
  Filled 2019-02-02: qty 2

## 2019-02-02 MED ORDER — DIPHENHYDRAMINE HCL 50 MG/ML IJ SOLN
50.0000 mg | Freq: Once | INTRAMUSCULAR | Status: AC
  Administered 2019-02-02: 50 mg via INTRAVENOUS
  Filled 2019-02-02: qty 1

## 2019-02-02 MED ORDER — INSULIN ASPART 100 UNIT/ML ~~LOC~~ SOLN
0.0000 [IU] | Freq: Every day | SUBCUTANEOUS | Status: DC
  Administered 2019-02-03: 3 [IU] via SUBCUTANEOUS
  Filled 2019-02-02: qty 1

## 2019-02-02 MED ORDER — HYDROCORTISONE NA SUCCINATE PF 250 MG IJ SOLR
200.0000 mg | Freq: Once | INTRAMUSCULAR | Status: AC
  Administered 2019-02-02: 200 mg via INTRAVENOUS
  Filled 2019-02-02: qty 200

## 2019-02-02 MED ORDER — INSULIN ASPART 100 UNIT/ML ~~LOC~~ SOLN
0.0000 [IU] | Freq: Three times a day (TID) | SUBCUTANEOUS | Status: DC
  Administered 2019-02-03: 1 [IU] via SUBCUTANEOUS
  Administered 2019-02-03: 3 [IU] via SUBCUTANEOUS
  Filled 2019-02-02 (×2): qty 1

## 2019-02-02 NOTE — Progress Notes (Signed)
CODE SEPSIS - PHARMACY COMMUNICATION  **Broad Spectrum Antibiotics should be administered within 1 hour of Sepsis diagnosis**  Time Code Sepsis Called/Page Received: 1424  Antibiotics Ordered: Cefepime,Vancomycin  Time of 1st antibiotic administration: 1441  Additional action taken by pharmacy: none  If necessary, Name of Provider/Nurse Contacted: N/A    Pearla Dubonnet ,PharmD Clinical Pharmacist  02/02/2019  2:52 PM

## 2019-02-02 NOTE — ED Triage Notes (Signed)
Patient from home via [facility]. Patient states she was discharged from [facility] approx 1 week ago. Patient states since then she has been on 5L [place] at home with oxygen sat staying in the mid 80s. Patient reports since yesterday, she has had significant increase in work of breathing and decrease in O2 sat. Patient arrives on 15L non-rebreather with O2 saturation of 85%. MD at bedside upon arrival.

## 2019-02-02 NOTE — ED Notes (Signed)
Pt to ct scan.  Mask in place.  Pt on nonrebreather for scan  Pt alert.  nsr on monitor.

## 2019-02-02 NOTE — ED Notes (Signed)
Pt alert  meds infusing  Pt on high flow oxygen.  Pt awaiting ct scan and admission.

## 2019-02-02 NOTE — ED Notes (Signed)
ED TO INPATIENT HANDOFF REPORT  ED Nurse Name and Phone #: Quillian Quince Combs Name/Age/Gender Amanda Wiley 63 y.o. female Room/Bed: ED14A/ED14A  Code Status   Code Status: Prior  Home/SNF/Other Home Patient oriented to: self, place, time and situation Is this baseline? Yes   Triage Complete: Triage complete  Chief Complaint resp distress ems  Triage Note Patient from home via [facility]. Patient states she was discharged from [facility] approx 1 week ago. Patient states since then she has been on 5L [place] at home with oxygen sat staying in the mid 80s. Patient reports since yesterday, she has had significant increase in work of breathing and decrease in O2 sat. Patient arrives on 15L non-rebreather with O2 saturation of 85%. MD at bedside upon arrival.    Allergies Allergies  Allergen Reactions  . Avelox [Moxifloxacin Hcl In Nacl] Hives, Shortness Of Breath and Other (See Comments)  . Moxifloxacin Hives and Shortness Of Breath  . Allegra [Fexofenadine Hcl] Other (See Comments)    headaches  . Ibuprofen Swelling  . Sulfa Antibiotics Other (See Comments)    Difficulty breathing, rash, tongue swelling  . Tegretol [Carbamazepine] Other (See Comments)    Other reaction(s): Other (See Comments) Vomiting Vomiting   . Iodine Rash  . Ioxaglate Rash  . Ivp Dye [Iodinated Diagnostic Agents] Rash    Level of Care/Admitting Diagnosis ED Disposition    ED Disposition Condition [place]: [place] [[id number]]  Level of Care: Stepdown [14]  Covid Evaluation: Confirmed COVID Positive  Diagnosis: Acute on chronic respiratory failure with hypoxia [facility]) [[id number]]  Admitting Physician: Henreitta Leber [[id number]]  Attending Physician: Henreitta Leber [[id number]]  Estimated length of stay: past midnight tomorrow  Certification:: I certify this patient will need inpatient services for at least 2 midnights  PT Class (Do Not Modify): Inpatient  [101]  PT Acc Code (Do Not Modify): Private [1]       B Medical/Surgery History Past Medical History:  Diagnosis Date  . COPD (chronic obstructive pulmonary disease) ([place])   . Crohn's disease ([place])   . Depression   . Diabetes mellitus ([place])   . Fracture, foot    post fall  . Hypercholesterolemia   . Hypertension   . IBS (irritable bowel syndrome)   . Nephrolithiasis    s/p lithotripsy [facility])   Past Surgical History:  Procedure Laterality Date  . ABDOMINAL HYSTERECTOMY  1985  . APPENDECTOMY  1985  . BREAST EXCISIONAL BIOPSY Right 2013   benign  . COLONOSCOPY  2014  . LAPAROSCOPIC CHOLECYSTECTOMY  2005   Dr Tamala Julian  . LAPAROSCOPIC SUPRACERVICAL HYSTERECTOMY  1985   left ovary not removed  . LITHOTRIPSY     x7  . OOPHORECTOMY    . POLYPECTOMY    . SHOULDER SURGERY     right (pinning)  . SHOULDER SURGERY     left (pinning)  . TONSILLECTOMY  1966     A IV Location/Drains/Wounds Patient Lines/Drains/Airways Status   Active Line/Drains/Airways    Name:   Placement date:   Placement time:   Site:   Days:   Peripheral IV 02/02/19 Right Antecubital   02/02/19    1339    Antecubital   less than 1   Peripheral IV 02/02/19 Left Antecubital   02/02/19    1358    Antecubital   less than 1          Intake/Output Last  24 hours  Intake/Output Summary (Last 24 hours) at 02/02/2019 2328 Last data filed at 02/02/2019 2012 Gross per 24 hour  Intake 500 ml  Output -  Net 500 ml    Labs/Imaging Results for orders placed or performed during the hospital encounter of 02/02/19 (from the past 48 hour(s))  CBC with Differential     Status: Abnormal   Collection Time: 02/02/19 12:46 PM  Result Value Ref Range   WBC 9.5 4.0 - 10.5 K/uL   RBC 5.43 (H) 3.87 - 5.11 MIL/uL   Hemoglobin 15.2 (H) 12.0 - 15.0 g/dL   HCT 49.5 (H) 36.0 - 46.0 %   MCV 91.2 80.0 - 100.0 fL   MCH 28.0 26.0 - 34.0 pg   MCHC 30.7 30.0 - 36.0 g/dL   RDW 16.4 (H) 11.5 - 15.5 %    Platelets 279 150 - 400 K/uL   nRBC 0.7 (H) 0.0 - 0.2 %   Neutrophils Relative % 77 %   Neutro Abs 7.4 1.7 - 7.7 K/uL   Lymphocytes Relative 12 %   Lymphs Abs 1.1 0.7 - 4.0 K/uL   Monocytes Relative 7 %   Monocytes Absolute 0.6 0.1 - 1.0 K/uL   Eosinophils Relative 2 %   Eosinophils Absolute 0.2 0.0 - 0.5 K/uL   Basophils Relative 1 %   Basophils Absolute 0.1 0.0 - 0.1 K/uL   Immature Granulocytes 1 %   Abs Immature Granulocytes 0.12 (H) 0.00 - 0.07 K/uL    Comment: Performed at [facility], [address] [place], [place] [address]  Comprehensive metabolic panel     Status: Abnormal   Collection Time: 02/02/19 12:46 PM  Result Value Ref Range   Sodium 143 135 - 145 mmol/L   Potassium 3.9 3.5 - 5.1 mmol/L   Chloride 100 98 - 111 mmol/L   CO2 28 22 - 32 mmol/L   Glucose, Bld 260 (H) 70 - 99 mg/dL   BUN 10 8 - 23 mg/dL   Creatinine, Ser 0.64 0.44 - 1.00 mg/dL   Calcium 8.6 (L) 8.9 - 10.3 mg/dL   Total Protein 8.0 6.5 - 8.1 g/dL   Albumin 2.5 (L) 3.5 - 5.0 g/dL   AST 22 15 - 41 U/L   ALT 18 0 - 44 U/L   Alkaline Phosphatase 90 38 - 126 U/L   Total Bilirubin 0.7 0.3 - 1.2 mg/dL   GFR calc non Af Amer >60 >60 mL/min   GFR calc Af Amer >60 >60 mL/min   Anion gap 15 5 - 15    Comment: Performed at [facility], [place], [place] [address]  Troponin I (High Sensitivity)     Status: Abnormal   Collection Time: 02/02/19 12:46 PM  Result Value Ref Range   Troponin I (High Sensitivity) 21 (H) <18 ng/L    Comment: (NOTE) Elevated high sensitivity troponin I (hsTnI) values and significant  changes across serial measurements may suggest ACS but many other  chronic and acute conditions are known to elevate hsTnI results.  Refer to the "Links" section for chest pain algorithms and additional  guidance. Performed at [facility], [address] [place], [place] [address]   Magnesium     Status: None   Collection Time: 02/02/19 12:46 PM   Result Value Ref Range   Magnesium 2.2 1.7 - 2.4 mg/dL    Comment: Performed at [facility], [address], [place], [place] [address]  Fibrin derivatives D-Dimer (  [place] only)     Status: Abnormal   Collection Time: 02/02/19 12:46 PM  Result Value Ref Range   Fibrin derivatives D-dimer ([facility]) 4,814.42 (H) 0.00 - 499.00 ng/mL (FEU)    Comment: (NOTE) <> Exclusion of Venous Thromboembolism (VTE) - OUTPATIENT ONLY   (Emergency Department or [place])   0-499 ng/ml (FEU): With a low to intermediate pretest probability                      for VTE this test result excludes the diagnosis                      of VTE.   >499 ng/ml (FEU) : VTE not excluded; additional work up for VTE is                      required. <> Testing on Inpatients and Evaluation of Disseminated Intravascular   Coagulation (DIC) Reference Range:   0-499 ng/ml (FEU) Performed at [facility], [address] [place], [place] [address]   APTT     Status: None   Collection Time: 02/02/19 12:46 PM  Result Value Ref Range   aPTT 32 24 - 36 seconds    Comment: Performed at [facility], [address] [place], [place] [address]  Protime-INR     Status: None   Collection Time: 02/02/19 12:46 PM  Result Value Ref Range   Prothrombin Time 13.7 11.4 - 15.2 seconds   INR 1.1 0.8 - 1.2    Comment: (NOTE) INR goal varies based on device and disease states. Performed at [facility], [address] [place], [place] [address]   Brain natriuretic peptide     Status: Abnormal   Collection Time: 02/02/19 12:47 PM  Result Value Ref Range   B Natriuretic Peptide 471.0 (H) 0.0 - 100.0 pg/mL    Comment: Performed at [facility], [address] [place], [place] [address]  Lactic acid, plasma     Status: Abnormal   Collection Time: 02/02/19 12:47 PM  Result Value Ref Range   Lactic Acid, Venous 2.0 (HH) 0.5 - 1.9 mmol/L    Comment: CRITICAL RESULT CALLED TO, READ BACK BY AND  VERIFIED WITH LAURA CATES [id number] 02/02/2019 KMP/JJB Performed at [facility], [address] [place], [place] [address]   SARS Coronavirus 2 by RT PCR (hospital order, performed in [place] hospital lab) Nasopharyngeal Nasopharyngeal Swab     Status: Abnormal   Collection Time: 02/02/19 12:47 PM   Specimen: Nasopharyngeal Swab  Result Value Ref Range   SARS Coronavirus 2 POSITIVE (A) NEGATIVE    Comment: RESULT CALLED TO, READ BACK BY AND VERIFIED WITH:  AMBER PAYNE AT 7209, 02/02/2019 SDR (NOTE) If result is NEGATIVE SARS-CoV-2 target nucleic acids are NOT DETECTED. The SARS-CoV-2 RNA is generally detectable in upper and lower  respiratory specimens during the acute phase of infection. The lowest  concentration of SARS-CoV-2 viral copies this assay can detect is 250  copies / mL. A negative result does not preclude SARS-CoV-2 infection  and should not be used as the sole basis for treatment or other  patient management decisions.  A negative result may occur with  improper specimen collection / handling, submission of specimen other  than nasopharyngeal swab, presence of viral mutation(s) within the  areas targeted by this assay, and inadequate number of viral copies  (<250 copies / mL). A negative result  must be combined with clinical  observations, patient history, and epidemiological information. If result is POSITIVE SARS-CoV-2 target nucleic acids are DETECTED.  The SARS-CoV-2 RNA is generally detectable in upper and lower  respiratory specimens during the acute phase of infection.  Positive  results are indicative of active infection with SARS-CoV-2.  Clinical  correlation with patient history and other diagnostic information is  necessary to determine patient infection status.  Positive results do  not rule out bacterial infection or co-infection with other viruses. If result is PRESUMPTIVE POSTIVE SARS-CoV-2 nucleic acids MAY BE PRESENT.   A presumptive  positive result was obtained on the submitted specimen  and confirmed on repeat testing.  While 2019 novel coronavirus  (SARS-CoV-2) nucleic acids may be present in the submitted sample  additional confirmatory testing may be necessary for epidemiological  and / or clinical management purposes  to differentiate between  SARS-CoV-2 and other Sarbecovirus currently known to infect humans.  If clinically indicated additional testing with an alternate test  methodology [phone]) i s advised. The SARS-CoV-2 RNA is generally  detectable in upper and lower respiratory specimens during the acute  phase of infection. The expected result is Negative. Fact Sheet for Patients:  StrictlyIdeas.no Fact Sheet for Healthcare Providers: BankingDealers.co.za This test is not yet approved or cleared by the [place] FDA and has been authorized for detection and/or diagnosis of SARS-CoV-2 by FDA under an Emergency Use Authorization (EUA).  This EUA will remain in effect (meaning this test can be used) for the duration of the COVID-19 declaration under Section 564(b)(1) of the Act, 21 U.S.C. section 360bbb-3(b)(1), unless the authorization is terminated or revoked sooner. Performed at [facility], [address] [place], [place] [address]   Blood gas, venous     Status: Abnormal   Collection Time: 02/02/19 12:47 PM  Result Value Ref Range   pH, Ven 7.45 (H) 7.250 - 7.430   pCO2, Ven 46 44.0 - 60.0 mmHg   pO2, Ven 47.0 (H) 32.0 - 45.0 mmHg   Patient temperature 37.0    Collection site VENOUS    Sample type VENOUS     Comment: Performed at [facility], [address] [place], [place] [address]  Blood gas, arterial ([facility] & [facility] ONLY)     Status: Abnormal   Collection Time: 02/02/19  1:43 PM  Result Value Ref Range   FIO2 1.00    Delivery systems HI FLOW NASAL CANNULA    pH, Arterial 7.50 (H) 7.350 - 7.450   pCO2 arterial 39 32.0 -  48.0 mmHg   pO2, Arterial 60 (L) 83.0 - 108.0 mmHg   Bicarbonate 30.4 (H) 20.0 - 28.0 mmol/L   Acid-Base Excess 6.7 (H) 0.0 - 2.0 mmol/L   O2 Saturation 92.8 %   Patient temperature 37.0    Collection site RIGHT RADIAL    Sample type ARTERIAL DRAW    Allens test (pass/fail) PASS PASS    Comment: Performed at [facility], [address] [place], [place] [address]  Urinalysis, Routine w reflex microscopic     Status: Abnormal   Collection Time: 02/02/19  1:43 PM  Result Value Ref Range   Color, Urine STRAW (A) YELLOW   APPearance CLEAR (A) CLEAR   Specific Gravity, Urine 1.013 1.005 - 1.030   pH 5.0 5.0 - 8.0   Glucose, UA >=500 (A) NEGATIVE mg/dL   Hgb urine dipstick NEGATIVE NEGATIVE   Bilirubin Urine NEGATIVE NEGATIVE   Ketones, ur NEGATIVE  NEGATIVE mg/dL   Protein, ur NEGATIVE NEGATIVE mg/dL   Nitrite NEGATIVE NEGATIVE   Leukocytes,Ua NEGATIVE NEGATIVE   RBC / HPF 0-5 0 - 5 RBC/hpf   WBC, UA 0-5 0 - 5 WBC/hpf   Bacteria, UA RARE (A) NONE SEEN   Squamous Epithelial / LPF 0-5 0 - 5   Mucus PRESENT     Comment: Performed at [facility], [address], [place], [place] [address]  Lactic acid, plasma     Status: None   Collection Time: 02/02/19  2:44 PM  Result Value Ref Range   Lactic Acid, Venous 1.5 0.5 - 1.9 mmol/L    Comment: Performed at [facility], [place], [place] [address]  Troponin I (High Sensitivity)     Status: Abnormal   Collection Time: 02/02/19  2:44 PM  Result Value Ref Range   Troponin I (High Sensitivity) 19 (H) <18 ng/L    Comment: (NOTE) Elevated high sensitivity troponin I (hsTnI) values and significant  changes across serial measurements may suggest ACS but many other  chronic and acute conditions are known to elevate hsTnI results.  Refer to the "Links" section for chest pain algorithms and additional  guidance. Performed at [facility], [address] [place], [place] [address]    Procalcitonin - Baseline     Status: None   Collection Time: 02/02/19  2:44 PM  Result Value Ref Range   Procalcitonin 0.14 ng/mL    Comment:        Interpretation: PCT (Procalcitonin) <= 0.5 ng/mL: Systemic infection (sepsis) is not likely. Local bacterial infection is possible. (NOTE)       Sepsis PCT Algorithm           Lower Respiratory Tract                                      Infection PCT Algorithm    ----------------------------     ----------------------------         PCT < 0.25 ng/mL                PCT < 0.10 ng/mL         Strongly encourage             Strongly discourage   discontinuation of antibiotics    initiation of antibiotics    ----------------------------     -----------------------------       PCT 0.25 - 0.50 ng/mL            PCT 0.10 - 0.25 ng/mL               OR       >80% decrease in PCT            Discourage initiation of                                            antibiotics      Encourage discontinuation           of antibiotics    ----------------------------     -----------------------------         PCT >= 0.50 ng/mL              PCT 0.26 - 0.50 ng/mL  AND        <80% decrease in PCT             Encourage initiation of                                             antibiotics       Encourage continuation           of antibiotics    ----------------------------     -----------------------------        PCT >= 0.50 ng/mL                  PCT > 0.50 ng/mL               AND         increase in PCT                  Strongly encourage                                      initiation of antibiotics    Strongly encourage escalation           of antibiotics                                     -----------------------------                                           PCT <= 0.25 ng/mL                                                 OR                                        > 80% decrease in PCT                                     Discontinue / Do  not initiate                                             antibiotics Performed at [facility], [address] [place], [place] [address]    Dg Chest 1 View  Result Date: 02/02/2019 CLINICAL DATA:  Shortness of breath. EXAM: CHEST  1 VIEW COMPARISON:  January 18, 2019. FINDINGS: Stable cardiomediastinal silhouette. Increased diffuse airspace opacities are noted bilaterally consistent with multifocal pneumonia. No pneumothorax or pleural effusion is noted. Status post right proximal humerus surgical internal fixation. IMPRESSION: Significantly increased bilateral lung opacities are noted consistent with worsening multifocal pneumonia. Electronically Signed   By: Marijo Conception M.D.   On: 02/02/2019 13:47   Ct Angio  Chest Pe W And/or Wo Contrast  Result Date: 02/02/2019 CLINICAL DATA:  Complex chest pain EXAM: CT ANGIOGRAPHY CHEST WITH CONTRAST TECHNIQUE: Multidetector CT imaging of the chest was performed using the standard protocol during bolus administration of intravenous contrast. Multiplanar CT image reconstructions and MIPs were obtained to evaluate the vascular anatomy. CONTRAST:  27m OMNIPAQUE IOHEXOL 350 MG/ML SOLN COMPARISON:  None. FINDINGS: Cardiovascular: Contrast injection is sufficient to demonstrate satisfactory opacification of the pulmonary arteries to the segmental level. There is no pulmonary embolus. The main pulmonary artery is within normal limits for size. There is no CT evidence of acute right heart strain. The aorta demonstrates extensive vascular calcifications. Heart size is normal, without pericardial effusion. Mediastinum/Nodes: --No mediastinal or hilar lymphadenopathy. --No axillary lymphadenopathy. --No supraclavicular lymphadenopathy. --bilateral thyroid nodules are noted. --The esophagus is unremarkable Lungs/Pleura: There are extensive ground-glass bilateral airspace opacities involving all lobes. There are small bilateral pleural effusions, right greater  than left. There is no pneumothorax. There is no discrete pulmonary nodule, however evaluation is limited by extensive ground-glass airspace opacities. There is some debris within the trachea. Upper Abdomen: No acute abnormality. Musculoskeletal: No chest wall abnormality. No acute or significant osseous findings. Review of the MIP images confirms the above findings. IMPRESSION: 1. No evidence for pulmonary embolus. 2. Extensive ground-glass airspace opacities involving all lobes of both lungs, with small bilateral pleural effusions, right greater than left. These findings are consistent with the patient's history of viral pneumonia. Pulmonary edema can have a similar appearance. Aortic Atherosclerosis (ICD10-I70.0). Electronically Signed   By: CConstance HolsterM.D.   On: 02/02/2019 17:43    Pending Labs Unresulted Labs (From admission, onward)    Start     Ordered   02/03/19 0500  Procalcitonin  Daily,   STAT     02/02/19 1418   02/03/19 02585 Basic metabolic panel  Tomorrow morning,   STAT     02/02/19 1923   02/02/19 1907  MRSA PCR Screening  ONCE - STAT,   STAT     02/02/19 1908   02/02/19 1548  Respiratory Panel by PCR  (Respiratory virus panel with precautions)  ONCE - STAT,   STAT     02/02/19 1547   02/02/19 1547  Procalcitonin - Baseline  Add-on,   AD     02/02/19 1546   02/02/19 1325  Urine culture  ONCE - STAT,   STAT     02/02/19 1324   02/02/19 1237  Blood culture (routine x 2)  BLOOD CULTURE X 2,   STAT     02/02/19 1237   Signed and Held  Basic metabolic panel  Tomorrow morning,   R     Signed and Held   Signed and Held  CBC  Tomorrow morning,   R     Signed and Held   Signed and Held  C-reactive protein  Add-on,   R     Signed and Held   Signed and Held  Fibrin derivatives D-Dimer ([facility] only)  Add-on,   R     Signed and Held          Vitals/Pain Today's Vitals   02/02/19 2130 02/02/19 2200 02/02/19 2230 02/02/19 2300  BP: (!) 122/50 (!) 119/58 (!) 115/53 117/60   Pulse: 99 (!) 101 99 97  Resp: 17 (!) 26 (!) 29 (!) 26  Temp:      TempSrc:      SpO2: (!) 89% (!) 82% (!) 86% (!) 87%  Weight:      Height:      PainSc:        Isolation Precautions Droplet precaution  Medications Medications  insulin aspart (novoLOG) injection 0-9 Units (has no administration in time range)  insulin aspart (novoLOG) injection 0-5 Units (has no administration in time range)  ceFEPIme (MAXIPIME) 2 g in sodium chloride 0.9 % 100 mL IVPB (has no administration in time range)  vancomycin (VANCOCIN) 2,000 mg in sodium chloride 0.9 % 500 mL IVPB (has no administration in time range)  hydrocortisone sodium succinate (SOLU-CORTEF) injection 200 mg (200 mg Intravenous Given 02/02/19 1310)  diphenhydrAMINE (BENADRYL) capsule 50 mg ( Oral See Alternative 02/02/19 1629)    Or  diphenhydrAMINE (BENADRYL) injection 50 mg (50 mg Intravenous Given 02/02/19 1629)  ipratropium-albuterol (DUONEB) 0.5-2.5 (3) MG/3ML nebulizer solution 3 mL (3 mLs Nebulization Given 02/02/19 1347)  ceFEPIme (MAXIPIME) 2 g in sodium chloride 0.9 % 100 mL IVPB (0 g Intravenous Stopped 02/02/19 1535)  vancomycin (VANCOCIN) 2,000 mg in sodium chloride 0.9 % 500 mL IVPB (0 mg Intravenous Stopped 02/02/19 2012)  iohexol (OMNIPAQUE) 350 MG/ML injection 75 mL (75 mLs Intravenous Contrast Given 02/02/19 1723)    Mobility walks Low fall risk   Focused Assessments Respiratory   R Recommendations: See Admitting Provider Note  Report given to:   Additional Notes:

## 2019-02-02 NOTE — Assessment & Plan Note (Signed)
Sees Dr Gabriel Carina.  Just evaluated 01/23/19.

## 2019-02-02 NOTE — Consult Note (Signed)
Pharmacy Antibiotic Note  Amanda Wiley is a 62 y.o. female admitted on 02/02/2019 with pneumonia. Patient has also tested positive for COVID19. Pharmacy has been consulted for Remdesivir,Vancomycin and Cefepime dosing to treat both the virus and underlying pneumonia that may be causing respiratory failure. Patient was recently discharged from [facility] and completed full course of Remdesivir. After consulted with both the Hospitalist and Intensivist, the decision was made to not retreat.  Plan: 1) Vancomycin 2000 mg IV Q 24 hrs. Goal AUC 400-550. Expected AUC: 498.0 Expected Css: 10.7 SCr used: 0.8(actual 0.64)  MRSA PCR has been ordered. Will follow up with result and if negative will recommended discontinuation.  2) Cefepime 2g Q8 hours  Height: 5' 5.5" (166.4 cm) Weight: 210 lb (95.3 kg) IBW/kg (Calculated) : 58.15  Temp (24hrs), Avg:98.9 F (37.2 C), Min:98.9 F (37.2 C), Max:98.9 F (37.2 C)  Recent Labs  Lab 02/02/19 1246 02/02/19 1247 02/02/19 1444  WBC 9.5  --   --   CREATININE 0.64  --   --   LATICACIDVEN  --  2.0* 1.5    Estimated Creatinine Clearance: 81.9 mL/min (by C-G formula based on SCr of 0.64 mg/dL).    Allergies  Allergen Reactions  . Avelox [Moxifloxacin Hcl In Nacl] Hives, Shortness Of Breath and Other (See Comments)  . Moxifloxacin Hives and Shortness Of Breath  . Allegra [Fexofenadine Hcl] Other (See Comments)    headaches  . Ibuprofen Swelling  . Sulfa Antibiotics Other (See Comments)    Difficulty breathing, rash, tongue swelling  . Tegretol [Carbamazepine] Other (See Comments)    Other reaction(s): Other (See Comments) Vomiting Vomiting   . Iodine Rash  . Ioxaglate Rash  . Ivp Dye [Iodinated Diagnostic Agents] Rash    Antimicrobials this admission: VAncomycin 10/26 >>  Cefepime 10/26 >>    Microbiology results: 10/26 BCx: pending 10/26 UCx: pending  10/26 MRSA PCR: pending  Thank you for allowing pharmacy to be a part of  this patient's care.  Eldana Isip A Tillman Kazmierski 02/02/2019 4:55 PM

## 2019-02-02 NOTE — Progress Notes (Signed)
[name], female   DOB: Feb 14, 1956, 63 y.o.   MRN: [id number]   Virtual Visit via video Note  This visit type was conducted due to national recommendations for restrictions regarding the COVID-19 pandemic (e.g. social distancing).  This format is felt to be most appropriate for this patient at this time.  All issues noted in this document were discussed and addressed.  No physical exam was performed (except for noted visual exam findings with Video Visits).   I connected with [name] by a video enabled telemedicine application and verified that I am speaking with the correct person using two identifiers. Location patient: home Location provider: work Persons participating in the virtual visit: patient, provider  I discussed the limitations, risks, security and privacy concerns of performing an evaluation and management service by video and the availability of in person appointments.  The patient expressed understanding and agreed to proceed.   Reason for visit: scheduled follow up.    HPI: She was admitted 01/12/19 - 01/19/19 with covid.  Diagnosed with covid pneumonia - acute hypoxic respiratory failure.  She received convalescent plasma and completed course of remdesivir.  Was also treated with steroids.  Discharged home on [facility].  (not previously on oxygen).  She has been using incentive spirometer.  Last visit, was feeling some better.  Still requiring 5L oxygen.  States yesterday, she tried to take a shower and became extremely sob.  Has not "recovered".  Still sob.  Hard to complete sentences.  Still with cough productive of thick mucus.  Using advair, spiriva and albuterol.  States pulse ox is has not registered above 80 in the last 24 hours.  Feels fatigued.  Also notices not being able to think as clearly at times over the last 24 hours.  She is eating some.  No vomiting.  No diarrhea.     ROS: See pertinent positives and negatives per HPI.  Past Medical History:   Diagnosis Date  . COPD (chronic obstructive pulmonary disease) ([place])   . Crohn's disease ([place])   . Depression   . Diabetes mellitus ([place])   . Fracture, foot    post fall  . Hypercholesterolemia   . Hypertension   . IBS (irritable bowel syndrome)   . Nephrolithiasis    s/p lithotripsy [facility])    Past Surgical History:  Procedure Laterality Date  . ABDOMINAL HYSTERECTOMY  1985  . APPENDECTOMY  1985  . BREAST EXCISIONAL BIOPSY Right 2013   benign  . COLONOSCOPY  2014  . LAPAROSCOPIC CHOLECYSTECTOMY  2005   Dr Tamala Julian  . LAPAROSCOPIC SUPRACERVICAL HYSTERECTOMY  1985   left ovary not removed  . LITHOTRIPSY     x7  . OOPHORECTOMY    . POLYPECTOMY    . SHOULDER SURGERY     right (pinning)  . SHOULDER SURGERY     left (pinning)  . TONSILLECTOMY  1966    Family History  Problem Relation Age of Onset  . Heart disease Father   . Emphysema Maternal Grandmother   . COPD Mother   . Hypercholesterolemia Mother   . Emphysema Mother   . Hypertension Brother   . Emphysema Other        grandmother  . Breast cancer Neg Hx   . Colon cancer Neg Hx     SOCIAL HX: reviewed.    Current Outpatient Medications:  .  acetaminophen (TYLENOL) 325 MG tablet, Take 2 tablets (650 mg total) by mouth every 6 (six) hours  as needed for mild pain or headache (fever >/= 101)., Disp:  , Rfl:  .  albuterol (PROVENTIL) (2.5 MG/3ML) 0.083% nebulizer solution, Take 3 mLs (2.5 mg total) by nebulization every 6 (six) hours as needed for wheezing., Disp: 150 mL, Rfl: 1 .  albuterol (VENTOLIN HFA) 108 (90 Base) MCG/ACT inhaler, Inhale 2 puffs into the lungs every 6 (six) hours as needed., Disp: 54 g, Rfl: 3 .  ALPRAZolam (XANAX) 0.25 MG tablet, Take 1 tablet (0.25 mg total) by mouth daily as needed for anxiety., Disp: 30 tablet, Rfl: 0 .  amLODipine (NORVASC) 5 MG tablet, Take 1 tablet (5 mg total) by mouth daily., Disp: 90 tablet, Rfl: 3 .  benzonatate (TESSALON) 100 MG capsule, Take 1  capsule (100 mg total) by mouth 3 (three) times daily as needed for cough., Disp: 21 capsule, Rfl: 0 .  buPROPion (WELLBUTRIN XL) 300 MG 24 hr tablet, Take 1 tablet (300 mg total) by mouth daily., Disp: 90 tablet, Rfl: 3 .  Dulaglutide (TRULICITY) 1.5 MP/5.3IR SOPN, Inject 1.5 mg into the skin every 7 (seven) days. , Disp: , Rfl:  .  empagliflozin (JARDIANCE) 25 MG TABS tablet, Take 25 mg by mouth daily. , Disp: , Rfl:  .  fluticasone (FLONASE) 50 MCG/ACT nasal spray, Place 2 sprays into both nostrils daily., Disp: 48 g, Rfl: 3 .  fluticasone-salmeterol (ADVAIR HFA) 230-21 MCG/ACT inhaler, Inhale 2 puffs into the lungs 2 (two) times daily., Disp: 3 Inhaler, Rfl: 3 .  furosemide (LASIX) 20 MG tablet, Take 1 tablet (20 mg total) by mouth daily., Disp: 90 tablet, Rfl: 3 .  hyoscyamine (LEVSIN SL) 0.125 MG SL tablet, Place 1 tablet (0.125 mg total) under the tongue 2 (two) times daily as needed., Disp: 30 tablet, Rfl: 0 .  losartan (COZAAR) 100 MG tablet, Take 1 tablet (100 mg total) by mouth daily., Disp: 90 tablet, Rfl: 3 .  metFORMIN (GLUCOPHAGE) 500 MG tablet, 2 tablets bid (Patient taking differently: Take 1,000 mg by mouth 2 (two) times daily with a meal. ), Disp: 360 tablet, Rfl: 3 .  metoprolol succinate (TOPROL-XL) 50 MG 24 hr tablet, Take 1 tablet (50 mg total) by mouth daily. Take with or immediately following a meal., Disp: 90 tablet, Rfl: 3 .  montelukast (SINGULAIR) 10 MG tablet, Take 1 tablet (10 mg total) by mouth daily., Disp: 90 tablet, Rfl: 3 .  RABEprazole (ACIPHEX) 20 MG tablet, Take 1 tablet (20 mg total) by mouth 2 (two) times daily before a meal., Disp: 180 tablet, Rfl: 3 .  SPIRIVA RESPIMAT 2.5 MCG/ACT AERS, USE 2 INHALATIONS DAILY (Patient taking differently: Take 2 puffs by mouth daily. ), Disp: 12 g, Rfl: 4  EXAM:  GENERAL: alert.  Appears to not feel well.  Hard to complete sentences.    HEENT: atraumatic, conjunttiva clear, no obvious abnormalities on inspection of  external nose and ears  NECK: normal movements of the head and neck  LUNGS: on inspection appears to be having trouble getting in a good breath.  Appears sob.  No gasping.    CV: no obvious cyanosis  MS: moves all visible extremities without noticeable abnormality  PSYCH/NEURO: pleasant and cooperative, no obvious depression or anxiety, speech and thought processing grossly intact  ASSESSMENT AND PLAN:  Discussed the following assessment and plan:  Acute respiratory failure due to COVID-19 [facility]) Recently admitted 10/5 - 01/19/19 with covid pneumonia.  Discharged on 5L [place].  Has noticed increased sob and difficulty breathing in the last  24 hours.  Persistent cough - productive of thick mucus.  Using advair, albuterol and spiriva.  No improvement.  States pulse ox not registering above 80% for the last 24 hours.  Still on 5LNC.  Has checked machine and connection.  She is getting the oxygen.  Feels more confused and like she is not thinking as clearly.  Also reports increased fatigue.  Given worsening sob, confusion, and increased need for oxygen, feel she needs reevaluation - ER.  Pt agreeable.  Husband will call [phone].  Pt in agreement.    Diabetes mellitus (HCC) Sees Dr Gabriel Carina.  Just evaluated 01/23/19.      I discussed the assessment and treatment plan with the patient. The patient was provided an opportunity to ask questions and all were answered. The patient agreed with the plan and demonstrated an understanding of the instructions.   The patient was advised to call back or seek an in-person evaluation if the symptoms worsen or if the condition fails to improve as anticipated.   Einar Pheasant, MD

## 2019-02-02 NOTE — ED Notes (Signed)
Report off to daniel rn

## 2019-02-02 NOTE — Progress Notes (Signed)
PHARMACY -  BRIEF ANTIBIOTIC NOTE   Pharmacy has received consult(s) for Vancomycin and Cefepime from an ED provider.  The patient's profile has been reviewed for ht/wt/allergies/indication/available labs.    One time order(s) placed for Vancomycin 2g IV x 1 and Cefepime 2g IV x 1  Further antibiotics/pharmacy consults should be ordered by admitting physician if indicated.                       Thank you, Pearla Dubonnet 02/02/2019  2:36 PM

## 2019-02-02 NOTE — Consult Note (Signed)
Name: Amanda Wiley MRN: [id number] DOB: [birth year]    ADMISSION DATE:  02/02/2019 CONSULTATION DATE: 02/02/2019  REFERRING MD : Dr. Verdell Carmine  CHIEF COMPLAINT: Shortness of Breath   BRIEF PATIENT DESCRIPTION:  63 yo female diagnosed and treated for COVID-19 on 10/3 and discharged from [facility] on 10/12 following treatment admitted to [facility] stepdown unit with acute hypoxic respiratory failure secondary to multifocal pneumonia and ARDS vs. Pulmonary edema requiring HFNC   SIGNIFICANT EVENTS/STUDIES:  10/26: CTA Chest revealed no evidence for pulmonary embolus. Extensive ground-glass airspace opacities involving all lobes of both lungs, with small bilateral pleural effusions, right greater than left. These findings are consistent with the patient's history of viral pneumonia. Pulmonary edema can have a similar appearance. Aortic Atherosclerosis (ICD10-I70.0). 10/26: Pt admitted to the stepdown unit with acute hypoxic respiratory failure requiring HFNC   HISTORY OF PRESENT ILLNESS:   This is a 63 yo female with a PMH of Nephrolithiasis, IBS, HTN, Hypercholesterolemia, Type II Diabetes Mellitus, Crohn's Disease, and COPD.  She presented to [facility] ER on 10/26 with shortness of breath.  She was recently admitted to [facility] following diagnosis of COVID-19 and treated with convalescent plasma, completed a course of remdesivir, and steroids. She was discharged from [facility] on 10/5 with home O2 @5L  via nasal canula with O2 sats in the high 80's and continued fatigue.  During current ER visit pt stated her O2 sats at home were 60-70% over a 24 hr period despite her home O2 @5L . Upon arrival to the ER pt in severe respiratory failure, worse when exerted with low grade fever and cough.  She was initially placed on NRB, then transitioned to HFNC @40L  at 100% with O2 sats increasing to 85%.  CXR concerning for worsening multifocal pneumonia and ARDS vs. pulmonary edema.   CTA Chest negative for pulmonary embolus, but concerning for extensive ground-glass opacities in all lobes, small bilateral pleural effusions, and possible pulmonary edema.  Lab results revealed glucose 260, troponin 21, BNP 471, lactic acid 2.0, wbc 9.5, pct 0.14, d-dimer 9,798.92, COVID-19 negative, and UA negative for UTI.  She received cefepime, vancomycin, solu-cortef, and duoneb x1 dose.  She was subsequently admitted to the stepdown unit per hospitalist team for additional workup and treatment.    PAST MEDICAL HISTORY :   has a past medical history of COPD (chronic obstructive pulmonary disease) ([place]), Crohn's disease ([place]), Depression, Diabetes mellitus ([place]), Fracture, foot, Hypercholesterolemia, Hypertension, IBS (irritable bowel syndrome), and Nephrolithiasis.  has a past surgical history that includes Tonsillectomy (1966); Laparoscopic supracervical hysterectomy (1985); Lithotripsy; Laparoscopic cholecystectomy (2005); Polypectomy; Shoulder surgery; Shoulder surgery; Abdominal hysterectomy (1985); Appendectomy (1985); Oophorectomy; Breast excisional biopsy (Right, 2013); and Colonoscopy (2014). Prior to Admission medications   Medication Sig Start Date End Date Taking? Authorizing Provider  acetaminophen (TYLENOL) 325 MG tablet Take 2 tablets (650 mg total) by mouth every 6 (six) hours as needed for mild pain or headache (fever >/= 101). 01/19/19  Yes Cherene Altes, MD  albuterol (PROVENTIL) (2.5 MG/3ML) 0.083% nebulizer solution Take 3 mLs (2.5 mg total) by nebulization every 6 (six) hours as needed for wheezing. 01/30/19  Yes Einar Pheasant, MD  albuterol (VENTOLIN HFA) 108 (90 Base) MCG/ACT inhaler Inhale 2 puffs into the lungs every 6 (six) hours as needed. 01/30/19  Yes Einar Pheasant, MD  ALPRAZolam Duanne Moron) 0.25 MG tablet Take 1 tablet (0.25 mg total) by mouth daily as needed for anxiety. 02/11/18  Yes Einar Pheasant, MD  amLODipine (NORVASC) 5 MG tablet Take 1 tablet (5 mg total)  by mouth daily. 11/13/18  Yes Einar Pheasant, MD  benzonatate (TESSALON) 100 MG capsule Take 1 capsule (100 mg total) by mouth 3 (three) times daily as needed for cough. 01/23/19  Yes Einar Pheasant, MD  buPROPion (WELLBUTRIN XL) 300 MG 24 hr tablet Take 1 tablet (300 mg total) by mouth daily. 11/13/18  Yes Einar Pheasant, MD  Dulaglutide (TRULICITY) 1.5 RA/0.7MA SOPN Inject 1.5 mg into the skin every 7 (seven) days.  05/12/18  Yes [provider]  empagliflozin (JARDIANCE) 25 MG TABS tablet Take 25 mg by mouth daily.    Yes [provider]  fluticasone-salmeterol (ADVAIR HFA) 230-21 MCG/ACT inhaler Inhale 2 puffs into the lungs 2 (two) times daily. 05/09/18  Yes Flora Lipps, MD  furosemide (LASIX) 20 MG tablet Take 1 tablet (20 mg total) by mouth daily. 11/13/18  Yes Einar Pheasant, MD  hyoscyamine (LEVSIN SL) 0.125 MG SL tablet Place 1 tablet (0.125 mg total) under the tongue 2 (two) times daily as needed. 11/13/18  Yes Einar Pheasant, MD  losartan (COZAAR) 100 MG tablet Take 1 tablet (100 mg total) by mouth daily. 11/17/18  Yes Einar Pheasant, MD  metFORMIN (GLUCOPHAGE) 500 MG tablet 2 tablets bid Patient taking differently: Take 1,000 mg by mouth 2 (two) times daily with a meal.  11/13/18  Yes Einar Pheasant, MD  metoprolol succinate (TOPROL-XL) 50 MG 24 hr tablet Take 1 tablet (50 mg total) by mouth daily. Take with or immediately following a meal. 11/13/18  Yes Einar Pheasant, MD  montelukast (SINGULAIR) 10 MG tablet Take 1 tablet (10 mg total) by mouth daily. Patient taking differently: Take 10 mg by mouth at bedtime.  11/13/18  Yes Einar Pheasant, MD  RABEprazole (ACIPHEX) 20 MG tablet Take 1 tablet (20 mg total) by mouth 2 (two) times daily before a meal. 11/13/18  Yes Einar Pheasant, MD  SPIRIVA RESPIMAT 2.5 MCG/ACT AERS USE 2 INHALATIONS DAILY Patient taking differently: Take 2 puffs by mouth daily.  02/04/18  Yes Flora Lipps, MD   Allergies  Allergen Reactions   Avelox  [Moxifloxacin Hcl In Nacl] Hives, Shortness Of Breath and Other (See Comments)   Moxifloxacin Hives and Shortness Of Breath   Allegra [Fexofenadine Hcl] Other (See Comments)    headaches   Ibuprofen Swelling   Sulfa Antibiotics Other (See Comments)    Difficulty breathing, rash, tongue swelling   Tegretol [Carbamazepine] Other (See Comments)    Other reaction(s): Other (See Comments) Vomiting Vomiting    Iodine Rash   Ioxaglate Rash   Ivp Dye [Iodinated Diagnostic Agents] Rash    FAMILY HISTORY:  family history includes COPD in her mother; Emphysema in her maternal grandmother, mother, and another family member; Heart disease in her father; Hypercholesterolemia in her mother; Hypertension in her brother. SOCIAL HISTORY:  reports that she quit smoking about 24 years ago. She has a 30.00 pack-year smoking history. She has never used smokeless tobacco. She reports previous alcohol use. She reports that she does not use drugs.  REVIEW OF SYSTEMS: Positives in BOLD  Constitutional: Negative for fever, chills, weight loss, malaise/fatigue and diaphoresis.  HENT: Negative for hearing loss, ear pain, nosebleeds, congestion, sore throat, neck pain, tinnitus and ear discharge.   Eyes: Negative for blurred vision, double vision, photophobia, pain, discharge and redness.  Respiratory: cough, hemoptysis, sputum production, shortness of breath, wheezing and stridor.   Cardiovascular: Negative for chest pain, palpitations, orthopnea, claudication, leg swelling  and PND.  Gastrointestinal: Negative for heartburn, nausea, vomiting, abdominal pain, diarrhea, constipation, blood in stool and melena.  Genitourinary: Negative for dysuria, urgency, frequency, hematuria and flank pain.  Musculoskeletal: Negative for myalgias, back pain, joint pain and falls.  Skin: Negative for itching and rash.  Neurological: Negative for dizziness, tingling, tremors, sensory change, speech change, focal weakness,  seizures, loss of consciousness, weakness and headaches.  Endo/Heme/Allergies: Negative for environmental allergies and polydipsia. Does not bruise/bleed easily.  SUBJECTIVE:  Pt c/o shortness of breath worse with exertion   VITAL SIGNS: Temp:  [98.9 F (37.2 C)] 98.9 F (37.2 C) (10/26 1239) Pulse Rate:  [93-107] 100 (10/26 1930) Resp:  [23-43] 28 (10/26 1930) BP: (114-149)/(54-84) 148/69 (10/26 1930) SpO2:  [82 %-89 %] 85 % (10/26 1930) FiO2 (%):  [99 %] 99 % (10/26 1145) Weight:  [95.3 kg] 95.3 kg (10/26 1240)  PHYSICAL EXAMINATION: General: acutely ill appearing elderly female, NAD on HFNC Neuro: alert and oriented, follows commands  HEENT: supple, no JVD  Cardiovascular: nsr, rrr, no R/G  Lungs: rhonchi throughout, labored and tachypneic with exertion  Abdomen: +BS x4, obese, soft, non tender, non distended  Musculoskeletal: normal bulk and tone, no edema  Skin: intact no rashes or lesions present   Recent Labs  Lab 02/02/19 1246  NA 143  K 3.9  CL 100  CO2 28  BUN 10  CREATININE 0.64  GLUCOSE 260*   Recent Labs  Lab 02/02/19 1246  HGB 15.2*  HCT 49.5*  WBC 9.5  PLT 279   Dg Chest 1 View  Result Date: 02/02/2019 CLINICAL DATA:  Shortness of breath. EXAM: CHEST  1 VIEW COMPARISON:  January 18, 2019. FINDINGS: Stable cardiomediastinal silhouette. Increased diffuse airspace opacities are noted bilaterally consistent with multifocal pneumonia. No pneumothorax or pleural effusion is noted. Status post right proximal humerus surgical internal fixation. IMPRESSION: Significantly increased bilateral lung opacities are noted consistent with worsening multifocal pneumonia. Electronically Signed   By: Marijo Conception M.D.   On: 02/02/2019 13:47   Ct Angio Chest Pe W And/or Wo Contrast  Result Date: 02/02/2019 CLINICAL DATA:  Complex chest pain EXAM: CT ANGIOGRAPHY CHEST WITH CONTRAST TECHNIQUE: Multidetector CT imaging of the chest was performed using the standard  protocol during bolus administration of intravenous contrast. Multiplanar CT image reconstructions and MIPs were obtained to evaluate the vascular anatomy. CONTRAST:  67m OMNIPAQUE IOHEXOL 350 MG/ML SOLN COMPARISON:  None. FINDINGS: Cardiovascular: Contrast injection is sufficient to demonstrate satisfactory opacification of the pulmonary arteries to the segmental level. There is no pulmonary embolus. The main pulmonary artery is within normal limits for size. There is no CT evidence of acute right heart strain. The aorta demonstrates extensive vascular calcifications. Heart size is normal, without pericardial effusion. Mediastinum/Nodes: --No mediastinal or hilar lymphadenopathy. --No axillary lymphadenopathy. --No supraclavicular lymphadenopathy. --bilateral thyroid nodules are noted. --The esophagus is unremarkable Lungs/Pleura: There are extensive ground-glass bilateral airspace opacities involving all lobes. There are small bilateral pleural effusions, right greater than left. There is no pneumothorax. There is no discrete pulmonary nodule, however evaluation is limited by extensive ground-glass airspace opacities. There is some debris within the trachea. Upper Abdomen: No acute abnormality. Musculoskeletal: No chest wall abnormality. No acute or significant osseous findings. Review of the MIP images confirms the above findings. IMPRESSION: 1. No evidence for pulmonary embolus. 2. Extensive ground-glass airspace opacities involving all lobes of both lungs, with small bilateral pleural effusions, right greater than left. These findings are consistent with the  patient's history of viral pneumonia. Pulmonary edema can have a similar appearance. Aortic Atherosclerosis (ICD10-I70.0). Electronically Signed   By: Constance Holster M.D.   On: 02/02/2019 17:43    ASSESSMENT / PLAN:  Acute hypoxic respiratory failure secondary to multifocal pneumonia,  ARDS. vs. pulmonary edema and COVID-19 positive (dx  01/10/2019) Hx: COPD Supplemental O2 for dyspnea and/or hypoxia  Scheduled and prn bronchodilator therapy  PO steroids Trend WBC and monitor fever curve Trend PCT  Follow cultures Continue vancomycin and cefepime for now  Maintain airborne and droplet precautions  Will start iv lasix as bp tolerates   Elevated troponin's likely demand ischemia in the setting of acute respiratory failure  Hx: HTN and Hypercholesterolemia  Continuous telemetry monitoring  Continue outpatient antihypertensives  Echo pending   Type II diabetes mellitus  CBG's ac/hs  SSI   Crohn's Disease  SUP px: po protonix   Depression  Continue bupropion and prn xanax   VTE px: subq lovenox  Marda Stalker, [place] Pager [phone] (please enter 7 digits) PCCM Consult Pager [phone] (please enter 7 digits)

## 2019-02-02 NOTE — ED Notes (Signed)
Resumed care from [place] rn.  Pt alert  nsr on monitor.  primedoc with pt. For admission.

## 2019-02-02 NOTE — H&P (Signed)
[facility] [place] NAME: Amanda Wiley    MR#:  [id number]  DATE OF BIRTH:  [birth year]  DATE OF ADMISSION:  02/02/2019  PRIMARY CARE PHYSICIAN: Einar Pheasant, MD   REQUESTING/REFERRING PHYSICIAN: Dr. Marjean Donna  CHIEF COMPLAINT:   Chief Complaint  Patient presents with  . Respiratory Distress    HISTORY OF PRESENT ILLNESS:  Amanda Wiley  is a 63 y.o. female with a known history of COPD, Crohn's disease, diabetes, hypertension, hyperlipidemia, history of IBS, recent admission at [facility] due to Covid pneumonia and discharged 2 weeks ago now returns back to the hospital secondary to worsening shortness of breath.  Patient was admitted to the [facility] for newly diagnosed Covid pneumonia and treated there with convalescent plasma remdesivir and Decadron and discharged home on 5 L of oxygen and had been doing well until the past 24 to 48 hours patient says she has developed worsening shortness of breath even on minimal exertion.  She denies any fevers, chills admits to some mild nausea but no vomiting.  Due to her worsening respiratory distress she came to the ER for further evaluation.  In emergency room patient was noted to be in acute on chronic respiratory failure with hypoxemia secondary to pneumonia secondary to Covid and therefore hospitalist services were contacted for admission.  PAST MEDICAL HISTORY:   Past Medical History:  Diagnosis Date  . COPD (chronic obstructive pulmonary disease) ([place])   . Crohn's disease ([place])   . Depression   . Diabetes mellitus ([place])   . Fracture, foot    post fall  . Hypercholesterolemia   . Hypertension   . IBS (irritable bowel syndrome)   . Nephrolithiasis    s/p lithotripsy [facility])    PAST SURGICAL HISTORY:   Past Surgical History:  Procedure Laterality Date  . ABDOMINAL HYSTERECTOMY  1985  . APPENDECTOMY  1985  . BREAST EXCISIONAL BIOPSY Right 2013   benign  . COLONOSCOPY  2014  . LAPAROSCOPIC CHOLECYSTECTOMY  2005   Dr Tamala Julian  . LAPAROSCOPIC SUPRACERVICAL HYSTERECTOMY  1985   left ovary not removed  . LITHOTRIPSY     x7  . OOPHORECTOMY    . POLYPECTOMY    . SHOULDER SURGERY     right (pinning)  . SHOULDER SURGERY     left (pinning)  . TONSILLECTOMY  1966    SOCIAL HISTORY:   Social History   Tobacco Use  . Smoking status: Former Smoker    Packs/day: 2.00    Years: 15.00    Pack years: 30.00    Quit date: 04/09/1994    Years since quitting: 24.8  . Smokeless tobacco: Never Used  . Tobacco comment: QUIT IN 1990'S  Substance Use Topics  . Alcohol use: Not Currently    Alcohol/week: 0.0 standard drinks    FAMILY HISTORY:   Family History  Problem Relation Age of Onset  . Heart disease Father   . Emphysema Maternal Grandmother   . COPD Mother   . Hypercholesterolemia Mother   . Emphysema Mother   . Hypertension Brother   . Emphysema Other        grandmother  . Breast cancer Neg Hx   . Colon cancer Neg Hx     DRUG ALLERGIES:   Allergies  Allergen Reactions  . Avelox [Moxifloxacin Hcl In Nacl] Hives, Shortness Of Breath and Other (See Comments)  . Moxifloxacin Hives and Shortness Of Breath  .  Allegra [Fexofenadine Hcl] Other (See Comments)    headaches  . Ibuprofen Swelling  . Sulfa Antibiotics Other (See Comments)    Difficulty breathing, rash, tongue swelling  . Tegretol [Carbamazepine] Other (See Comments)    Other reaction(s): Other (See Comments) Vomiting Vomiting   . Iodine Rash  . Ioxaglate Rash  . Ivp Dye [Iodinated Diagnostic Agents] Rash    REVIEW OF SYSTEMS:   Review of Systems  Constitutional: Negative for fever and weight loss.  HENT: Negative for congestion, nosebleeds and tinnitus.   Eyes: Negative for blurred vision, double vision and redness.  Respiratory: Positive for cough and shortness of breath. Negative for hemoptysis.   Cardiovascular: Negative for chest pain, orthopnea,  leg swelling and PND.  Gastrointestinal: Negative for abdominal pain, diarrhea, melena, nausea and vomiting.  Genitourinary: Negative for dysuria, hematuria and urgency.  Musculoskeletal: Negative for falls and joint pain.  Neurological: Positive for weakness (generalized). Negative for dizziness, tingling, sensory change, focal weakness, seizures and headaches.  Endo/Heme/Allergies: Negative for polydipsia. Does not bruise/bleed easily.  Psychiatric/Behavioral: Negative for depression and memory loss. The patient is not nervous/anxious.     MEDICATIONS AT HOME:   Prior to Admission medications   Medication Sig Start Date End Date Taking? Authorizing Provider  acetaminophen (TYLENOL) 325 MG tablet Take 2 tablets (650 mg total) by mouth every 6 (six) hours as needed for mild pain or headache (fever >/= 101). 01/19/19  Yes Cherene Altes, MD  albuterol (PROVENTIL) (2.5 MG/3ML) 0.083% nebulizer solution Take 3 mLs (2.5 mg total) by nebulization every 6 (six) hours as needed for wheezing. 01/30/19  Yes Einar Pheasant, MD  albuterol (VENTOLIN HFA) 108 (90 Base) MCG/ACT inhaler Inhale 2 puffs into the lungs every 6 (six) hours as needed. 01/30/19  Yes Einar Pheasant, MD  ALPRAZolam Duanne Moron) 0.25 MG tablet Take 1 tablet (0.25 mg total) by mouth daily as needed for anxiety. 02/11/18  Yes Einar Pheasant, MD  amLODipine (NORVASC) 5 MG tablet Take 1 tablet (5 mg total) by mouth daily. 11/13/18  Yes Einar Pheasant, MD  benzonatate (TESSALON) 100 MG capsule Take 1 capsule (100 mg total) by mouth 3 (three) times daily as needed for cough. 01/23/19  Yes Einar Pheasant, MD  buPROPion (WELLBUTRIN XL) 300 MG 24 hr tablet Take 1 tablet (300 mg total) by mouth daily. 11/13/18  Yes Einar Pheasant, MD  Dulaglutide (TRULICITY) 1.5 OV/7.8HY SOPN Inject 1.5 mg into the skin every 7 (seven) days.  05/12/18  Yes [provider]  empagliflozin (JARDIANCE) 25 MG TABS tablet Take 25 mg by mouth daily.    Yes  [provider]  fluticasone-salmeterol (ADVAIR HFA) 230-21 MCG/ACT inhaler Inhale 2 puffs into the lungs 2 (two) times daily. 05/09/18  Yes Flora Lipps, MD  furosemide (LASIX) 20 MG tablet Take 1 tablet (20 mg total) by mouth daily. 11/13/18  Yes Einar Pheasant, MD  hyoscyamine (LEVSIN SL) 0.125 MG SL tablet Place 1 tablet (0.125 mg total) under the tongue 2 (two) times daily as needed. 11/13/18  Yes Einar Pheasant, MD  losartan (COZAAR) 100 MG tablet Take 1 tablet (100 mg total) by mouth daily. 11/17/18  Yes Einar Pheasant, MD  metFORMIN (GLUCOPHAGE) 500 MG tablet 2 tablets bid Patient taking differently: Take 1,000 mg by mouth 2 (two) times daily with a meal.  11/13/18  Yes Einar Pheasant, MD  metoprolol succinate (TOPROL-XL) 50 MG 24 hr tablet Take 1 tablet (50 mg total) by mouth daily. Take with or immediately following a meal.  11/13/18  Yes Einar Pheasant, MD  montelukast (SINGULAIR) 10 MG tablet Take 1 tablet (10 mg total) by mouth daily. Patient taking differently: Take 10 mg by mouth at bedtime.  11/13/18  Yes Einar Pheasant, MD  RABEprazole (ACIPHEX) 20 MG tablet Take 1 tablet (20 mg total) by mouth 2 (two) times daily before a meal. 11/13/18  Yes Einar Pheasant, MD  SPIRIVA RESPIMAT 2.5 MCG/ACT AERS USE 2 INHALATIONS DAILY Patient taking differently: Take 2 puffs by mouth daily.  02/04/18  Yes Flora Lipps, MD      VITAL SIGNS:  Blood pressure (!) 149/70, pulse 99, temperature 98.9 F (37.2 C), temperature source Axillary, resp. rate (!) 23, height 5' 5.5" (1.664 m), weight 95.3 kg, SpO2 (!) 86 %.  PHYSICAL EXAMINATION:  Physical Exam  GENERAL:  63 y.o.-year-old iobese patient lying in the bed in mild Resp. Distress on [place].   EYES: Pupils equal, round, reactive to light and accommodation. No scleral icterus. Extraocular muscles intact.  HEENT: Head atraumatic, normocephalic. Oropharynx and nasopharynx clear. No oropharyngeal erythema, moist oral mucosa  NECK:  Supple, no  jugular venous distention. No thyroid enlargement, no tenderness.  LUNGS: Good air entry bilaterally, diffuse bibasilar Rales, no rhonchi, wheezing, negative use of accessory muscles. CARDIOVASCULAR: S1, S2 RRR. No murmurs, rubs, gallops, clicks.  ABDOMEN: Soft, nontender, nondistended. Bowel sounds present. No organomegaly or mass.  EXTREMITIES: No pedal edema, cyanosis, or clubbing. + 2 pedal & radial pulses b/l.   NEUROLOGIC: Cranial nerves II through XII are intact. No focal Motor or sensory deficits appreciated b/l. Globally weak PSYCHIATRIC: The patient is alert and oriented x 3.  SKIN: No obvious rash, lesion, or ulcer.   LABORATORY PANEL:   CBC Recent Labs  Lab 02/02/19 1246  WBC 9.5  HGB 15.2*  HCT 49.5*  PLT 279   ------------------------------------------------------------------------------------------------------------------  Chemistries  Recent Labs  Lab 02/02/19 1246  NA 143  K 3.9  CL 100  CO2 28  GLUCOSE 260*  BUN 10  CREATININE 0.64  CALCIUM 8.6*  MG 2.2  AST 22  ALT 18  ALKPHOS 90  BILITOT 0.7   ------------------------------------------------------------------------------------------------------------------  Cardiac Enzymes No results for input(s): TROPONINI in the last 168 hours. ------------------------------------------------------------------------------------------------------------------  RADIOLOGY:  Dg Chest 1 View  Result Date: 02/02/2019 CLINICAL DATA:  Shortness of breath. EXAM: CHEST  1 VIEW COMPARISON:  January 18, 2019. FINDINGS: Stable cardiomediastinal silhouette. Increased diffuse airspace opacities are noted bilaterally consistent with multifocal pneumonia. No pneumothorax or pleural effusion is noted. Status post right proximal humerus surgical internal fixation. IMPRESSION: Significantly increased bilateral lung opacities are noted consistent with worsening multifocal pneumonia. Electronically Signed   By: Marijo Conception M.D.    On: 02/02/2019 13:47     IMPRESSION AND PLAN:   62 y.o. female with a known history of COPD, Crohn's disease, diabetes, hypertension, hyperlipidemia, history of IBS, recent admission at [facility] due to Covid pneumonia and discharged 2 weeks ago now returns back to the hospital secondary to worsening shortness of breath.  1.  Acute on chronic respiratory failure with hypoxia-this is suspected secondary to multifocal pneumonia secondary to Covid. -Continue high flow nasal cannula, will treat underlying pneumonia with broad-spectrum IV antibiotics with vancomycin, cefepime. -We will also continue Decadron, remdesivir and full dose Lovenox for underlying Covid.  2.  Covid pneumonia-source of patient's ongoing respiratory failure and hypoxemia. -Patient was recently hospitalized at [facility] and discharge about 2 weeks ago.  Patient has received convalescent  plasma. -Continue remdesivir, will add some Decadron, continue full dose Lovenox.  Patient is awaiting a CTA of the chest to rule out pulmonary embolism. -We will also continue broad-spectrum IV antibiotics as mentioned above.  3.  Essential hypertension-presently hemodynamically stable. -Continue Toprol, Norvasc, losartan.  4.  GERD-continue Protonix.  5.  COPD-no acute exacerbation.  Sleep -Continue Spiriva, Dulera and DuoNeb's as needed.  6.  Anxiety-continue Xanax, Wellbutrin.  7.  Diabetes type 2 without complication-we will place the patient on sliding scale insulin, follow blood sugars.  Patient will be admitted to the stepdown level of care.  Notified intensivist and spoke to Dr. Mortimer Fries.   All the records are reviewed and case discussed with ED provider. Management plans discussed with the patient, family and they are in agreement.  CODE STATUS: Full code  TOTAL TIME TAKING CARE OF THIS PATIENT: 45 minutes.    Henreitta Leber M.D on 02/02/2019 at 3:54 PM  Between 7am to 6pm - Pager - [phone]   After 6pm go to [email or web address] - password [email or web address] [facility]  Office  [phone]  CC: Primary care physician; Einar Pheasant, MD

## 2019-02-02 NOTE — Assessment & Plan Note (Signed)
Recently admitted 10/5 - 01/19/19 with covid pneumonia.  Discharged on 5L [place].  Has noticed increased sob and difficulty breathing in the last 24 hours.  Persistent cough - productive of thick mucus.  Using advair, albuterol and spiriva.  No improvement.  States pulse ox not registering above 80% for the last 24 hours.  Still on 5L[place].  Has checked machine and connection.  She is getting the oxygen.  Feels more confused and like she is not thinking as clearly.  Also reports increased fatigue.  Given worsening sob, confusion, and increased need for oxygen, feel she needs reevaluation - ER.  Pt agreeable.  Husband will call [phone].  Pt in agreement.

## 2019-02-02 NOTE — ED Provider Notes (Signed)
[facility] Emergency Department Provider Note  ____________________________________________   First MD Initiated Contact with Patient 02/02/19 1232     (approximate)  I have reviewed the triage vital signs and the nursing notes.   HISTORY  Chief Complaint Shortness of breath.  HPI Amanda Wiley is a 64 y.o. female with COPD who was diagnosed with coronavirus 10/3 and had acute hypoxic respiratory failure from it.  She received convalescent plasma and completed a course of remdesivir as well as steroids.  She was discharged home on 5 L nasal cannula with sats in the high 80s patient since discharge has continued to feel very weak.  She has been satting in the 70s to 80s on her 5 L nasal cannula over the last 24 hours.  She feels like she is not thinking as clearly secondary to the hypoxia.  Denies any recent falls.  Her shortness of breath is severe, constant, worse with try to move around, better with rest.  She is has some associated low grade fevers and cough.  She says she also may have a urinary infection.   10/1 SARS-CoV-2 test positive 10/5 admit to [facility] via [facility] ED 10/12 discharge home          Past Medical History:  Diagnosis Date   COPD (chronic obstructive pulmonary disease) ([place])    Crohn's disease ([place])    Depression    Diabetes mellitus ([place])    Fracture, foot    post fall   Hypercholesterolemia    Hypertension    IBS (irritable bowel syndrome)    Nephrolithiasis    s/p lithotripsy [facility])    Patient Active Problem List   Diagnosis Date Noted   Pneumonia due to 2019-nCoV 01/12/2019   Acute respiratory failure due to COVID-19 ([place]) 01/12/2019   Abnormal liver function tests 12/24/2018   Hepatomegaly 12/24/2018   Cyst of left kidney 12/22/2018   Sinusitis 06/21/2018   Polycythemia vera ([place]) 04/04/2018   Papilloma of left breast 03/04/2018   Nipple discharge 02/16/2018   Health care  maintenance 12/11/2014   Anxiety and depression 12/09/2014   Allergic drug reaction 12/09/2014   Personal history of urinary calculi 12/09/2014   Obesity, diabetes, and hypertension syndrome ([place]) 04/18/2014   Asthma 03/04/2012   Vitamin D deficiency 03/04/2012   Crohn's disease ([place]) 03/04/2012   GERD (gastroesophageal reflux disease) 03/04/2012   Diabetes mellitus ([place]) 03/04/2012   Hypertension 03/04/2012   Acid reflux 03/04/2012   Essential (primary) hypertension 03/04/2012   Hypercholesterolemia without hypertriglyceridemia 03/04/2012   CD (Crohn's disease) ([place]) 03/04/2012   Adaptive colitis 01/30/2007    Past Surgical History:  Procedure Laterality Date   [place]   BREAST EXCISIONAL BIOPSY Right 2013   benign   COLONOSCOPY  2014   LAPAROSCOPIC CHOLECYSTECTOMY  2005   Dr Tamala Julian   LAPAROSCOPIC SUPRACERVICAL HYSTERECTOMY  1985   left ovary not removed   LITHOTRIPSY     x7   OOPHORECTOMY     POLYPECTOMY     SHOULDER SURGERY     right (pinning)   SHOULDER SURGERY     left (pinning)   TONSILLECTOMY  1966    Prior to Admission medications   Medication Sig Start Date End Date Taking? Authorizing Provider  acetaminophen (TYLENOL) 325 MG tablet Take 2 tablets (650 mg total) by mouth every 6 (six) hours as needed for mild pain or headache (fever >/= 101). 01/19/19  Cherene Altes, MD  albuterol (PROVENTIL) (2.5 MG/3ML) 0.083% nebulizer solution Take 3 mLs (2.5 mg total) by nebulization every 6 (six) hours as needed for wheezing. 01/30/19   Einar Pheasant, MD  albuterol (VENTOLIN HFA) 108 (90 Base) MCG/ACT inhaler Inhale 2 puffs into the lungs every 6 (six) hours as needed. 01/30/19   Einar Pheasant, MD  ALPRAZolam Duanne Moron) 0.25 MG tablet Take 1 tablet (0.25 mg total) by mouth daily as needed for anxiety. 02/11/18   Einar Pheasant, MD  amLODipine (NORVASC) 5 MG tablet Take 1 tablet (5 mg total) by mouth  daily. 11/13/18   Einar Pheasant, MD  benzonatate (TESSALON) 100 MG capsule Take 1 capsule (100 mg total) by mouth 3 (three) times daily as needed for cough. 01/23/19   Einar Pheasant, MD  buPROPion (WELLBUTRIN XL) 300 MG 24 hr tablet Take 1 tablet (300 mg total) by mouth daily. 11/13/18   Einar Pheasant, MD  Dulaglutide (TRULICITY) 1.5 ZO/1.0RU SOPN Inject 1.5 mg into the skin every 7 (seven) days.  05/12/18   [provider]  empagliflozin (JARDIANCE) 25 MG TABS tablet Take 25 mg by mouth daily.     [provider]  fluticasone (FLONASE) 50 MCG/ACT nasal spray Place 2 sprays into both nostrils daily. 10/17/17   Einar Pheasant, MD  fluticasone-salmeterol (ADVAIR HFA) [phone] MCG/ACT inhaler Inhale 2 puffs into the lungs 2 (two) times daily. 05/09/18   Flora Lipps, MD  furosemide (LASIX) 20 MG tablet Take 1 tablet (20 mg total) by mouth daily. 11/13/18   Einar Pheasant, MD  hyoscyamine (LEVSIN SL) 0.125 MG SL tablet Place 1 tablet (0.125 mg total) under the tongue 2 (two) times daily as needed. 11/13/18   Einar Pheasant, MD  losartan (COZAAR) 100 MG tablet Take 1 tablet (100 mg total) by mouth daily. 11/17/18   Einar Pheasant, MD  metFORMIN (GLUCOPHAGE) 500 MG tablet 2 tablets bid Patient taking differently: Take 1,000 mg by mouth 2 (two) times daily with a meal.  11/13/18   Einar Pheasant, MD  metoprolol succinate (TOPROL-XL) 50 MG 24 hr tablet Take 1 tablet (50 mg total) by mouth daily. Take with or immediately following a meal. 11/13/18   Einar Pheasant, MD  montelukast (SINGULAIR) 10 MG tablet Take 1 tablet (10 mg total) by mouth daily. 11/13/18   Einar Pheasant, MD  RABEprazole (ACIPHEX) 20 MG tablet Take 1 tablet (20 mg total) by mouth 2 (two) times daily before a meal. 11/13/18   Einar Pheasant, MD  SPIRIVA RESPIMAT 2.5 MCG/ACT AERS USE 2 INHALATIONS DAILY Patient taking differently: Take 2 puffs by mouth daily.  02/04/18   Flora Lipps, MD    Allergies Avelox [moxifloxacin hcl in  nacl], Moxifloxacin, Allegra [fexofenadine hcl], Ibuprofen, Sulfa antibiotics, Tegretol [carbamazepine], Iodine, Ioxaglate, and Ivp dye [iodinated diagnostic agents]  Family History  Problem Relation Age of Onset   Heart disease Father    Emphysema Maternal Grandmother    COPD Mother    Hypercholesterolemia Mother    Emphysema Mother    Hypertension Brother    Emphysema Other        grandmother   Breast cancer Neg Hx    Colon cancer Neg Hx     Social History Social History   Tobacco Use   Smoking status: Former Smoker    Packs/day: 2.00    Years: 15.00    Pack years: 30.00    Quit date: 04/09/1994    Years since quitting: 24.8   Smokeless tobacco: Never Used  Tobacco comment: QUIT IN 1990'S  Substance Use Topics   Alcohol use: Not Currently    Alcohol/week: 0.0 standard drinks   Drug use: No      Review of Systems Constitutional: Subjective fevers Eyes: No visual changes. ENT: No sore throat. Cardiovascular: No chest pain Respiratory: Positive for SOB, cough Gastrointestinal: No abdominal pain.  No nausea, no vomiting.  No diarrhea.  No constipation. Genitourinary: Positive dysuria Musculoskeletal: Negative for back pain. Skin: Negative for rash. Neurological: Negative for headaches, focal weakness or numbness. All other ROS negative ____________________________________________   PHYSICAL EXAM:  VITAL SIGNS: Blood pressure 128/78, pulse (!) 107, temperature 98.9 F (37.2 C), temperature source Axillary, resp. rate (!) 43, height 5' 5.5" (1.664 m), weight 95.3 kg, SpO2 (!) 85 %.   Constitutional: Alert and oriented with mild increased work of breathing. Eyes: Conjunctivae are normal. EOMI. Head: Atraumatic. Nose: No congestion/rhinnorhea. Mouth/Throat: Mucous membranes are moist.   Neck: No stridor. Trachea Midline. FROM Cardiovascular: Tachycardic, regular rhythm. Grossly normal heart sounds.  Good peripheral circulation. Respiratory:  Clear lungs, mild increased work of breathing although sounding high 80s on a nonrebreather Gastrointestinal: Soft and nontender. No distention. No abdominal bruits.  Musculoskeletal: No lower extremity tenderness nor edema.  No joint effusions. Neurologic:  Normal speech and language. No gross focal neurologic deficits are appreciated.  Skin:  Skin is warm, dry and intact. No rash noted. Psychiatric: Mood and affect are normal. Speech and behavior are normal. GU: Deferred   ____________________________________________   LABS (all labs ordered are listed, but only abnormal results are displayed)  Labs Reviewed  SARS CORONAVIRUS 2 BY RT PCR ([place], [facility]) - Abnormal; Notable for the following components:      Result Value   SARS Coronavirus 2 POSITIVE (*)    All other components within normal limits  CBC WITH DIFFERENTIAL/PLATELET - Abnormal; Notable for the following components:   RBC 5.43 (*)    Hemoglobin 15.2 (*)    HCT 49.5 (*)    RDW 16.4 (*)    nRBC 0.7 (*)    Abs Immature Granulocytes 0.12 (*)    All other components within normal limits  COMPREHENSIVE METABOLIC PANEL - Abnormal; Notable for the following components:   Glucose, Bld 260 (*)    Calcium 8.6 (*)    Albumin 2.5 (*)    All other components within normal limits  BRAIN NATRIURETIC PEPTIDE - Abnormal; Notable for the following components:   B Natriuretic Peptide 471.0 (*)    All other components within normal limits  LACTIC ACID, PLASMA - Abnormal; Notable for the following components:   Lactic Acid, Venous 2.0 (*)    All other components within normal limits  FIBRIN DERIVATIVES D-DIMER ([facility] ONLY) - Abnormal; Notable for the following components:   Fibrin derivatives D-dimer [facility]) 8,850.27 (*)    All other components within normal limits  BLOOD GAS, VENOUS - Abnormal; Notable for the following components:   pH, Ven 7.45 (*)    pO2, Ven 47.0 (*)    All other  components within normal limits  BLOOD GAS, ARTERIAL - Abnormal; Notable for the following components:   pH, Arterial 7.50 (*)    pO2, Arterial 60 (*)    Bicarbonate 30.4 (*)    Acid-Base Excess 6.7 (*)    All other components within normal limits  URINALYSIS, ROUTINE W REFLEX MICROSCOPIC - Abnormal; Notable for the following components:   Color, Urine STRAW (*)  APPearance CLEAR (*)    Glucose, UA >=500 (*)    Bacteria, UA RARE (*)    All other components within normal limits  TROPONIN I (HIGH SENSITIVITY) - Abnormal; Notable for the following components:   Troponin I (High Sensitivity) 21 (*)    All other components within normal limits  CULTURE, BLOOD (ROUTINE X 2)  CULTURE, BLOOD (ROUTINE X 2)  URINE CULTURE  MAGNESIUM  APTT  PROTIME-INR  LACTIC ACID, PLASMA  PROCALCITONIN  TROPONIN I (HIGH SENSITIVITY)   ____________________________________________   ED ECG REPORT I, Vanessa [place], the attending physician, personally viewed and interpreted this ECG.  EKG is normal sinus rate of 93, no ST elevation, T wave inversion in V3 and lead III, normal intervals ____________________________________________  RADIOLOGY Robert Bellow, personally viewed and evaluated these images (plain radiographs) as part of my medical decision making, as well as reviewing the written report by the radiologist.  ED MD interpretation: Chest x-ray with bilateral lung opacities.  Official radiology report(s): Dg Chest 1 View  Result Date: 02/02/2019 CLINICAL DATA:  Shortness of breath. EXAM: CHEST  1 VIEW COMPARISON:  January 18, 2019. FINDINGS: Stable cardiomediastinal silhouette. Increased diffuse airspace opacities are noted bilaterally consistent with multifocal pneumonia. No pneumothorax or pleural effusion is noted. Status post right proximal humerus surgical internal fixation. IMPRESSION: Significantly increased bilateral lung opacities are noted consistent with worsening multifocal  pneumonia. Electronically Signed   By: Marijo Conception M.D.   On: 02/02/2019 13:47    ____________________________________________   PROCEDURES  Procedure(s) performed (including Critical Care):  Ultrasound ED Peripheral IV (Provider)  Date/Time: 02/02/2019 1:25 PM Performed by: Vanessa [place], MD Authorized by: Vanessa [place], MD   Procedure details:    Indications: poor IV access     Skin Prep: isopropyl alcohol     Location:  Left AC   Angiocath:  20 G   Bedside Ultrasound Guided: Yes     Patient tolerated procedure without complications: Yes     Dressing applied: Yes   .Critical Care Performed by: Vanessa , MD Authorized by: Vanessa , MD   Critical care provider statement:    Critical care time (minutes):  60   Critical care was necessary to treat or prevent imminent or life-threatening deterioration of the following conditions:  Respiratory failure   Critical care was time spent personally by me on the following activities:  Discussions with consultants, evaluation of patient's response to treatment, examination of patient, ordering and performing treatments and interventions, ordering and review of laboratory studies, ordering and review of radiographic studies, pulse oximetry, re-evaluation of patient's condition, obtaining history from patient or surrogate and review of old charts     ____________________________________________   INITIAL IMPRESSION / ASSESSMENT AND PLAN / ED COURSE   Amanda Wiley was evaluated in Emergency Department on 02/02/2019 for the symptoms described in the history of present illness. She was evaluated in the context of the global COVID-19 pandemic, which necessitated consideration that the patient might be at risk for infection with the SARS-CoV-2 virus that causes COVID-19. Institutional protocols and algorithms that pertain to the evaluation of patients at risk for COVID-19 are in a state of rapid change based on information  released by regulatory bodies including the CDC and federal and state organizations. These policies and algorithms were followed during the patient's care in the ED.    Patient presents with severe hypoxemia satting in the 60s to 70s on 5 L.  Patient is currently at 85 to 90% on nonrebreather.  Will get patient on high flow nasal cannula given she is has no increased work of breathing.  Bedside ultrasound did not show evidence of right heart strain so we will hold off on heparin.  Patient will need to be prepped for CT PE.  No wheezing on exam to suggest COPD flare.   Pt presents with SOB. Differential includes: PNA-will get xray to evaluation Anemia-CBC to evaluate ACS- will get trops Arrhythmia-Will get EKG and keep on monitor.  COVID- will get testing per algorithm. PE-will need CT PE.    Patient placed on high flow nasal cannula.  Patient is on 40 L at 100% is satting around 85%.  Patient says she feels much more comfortable but clearly her saturations are still concerning.  Patient would like to hold off on intubation if possible given my concern that this might be underlying damage from the coronavirus that is irreversible I worry that if we intubate her if she would be very hard to extubate..  Will get arterial blood gas to further evaluate hypoxemia give patient 10 minutes on the high flow nasal cannula.   Chest x-ray consistent with bilateral lung opacities concerning for multifocal pneumonia versus ARDS.  Patient started on broad-spectrum antibiotics to cover for hospital-acquired pneumonia although I query whether this is just secondary to be underlying damage from the coronavirus.  2:28 PMArterial blood gas shows a PO2 of 60 however the predicted oxygen saturation was at 92%.  Patient feels much more comfortable on the high flow nasal cannula.  We are waiting the coronavirus testing to decide further steps.  Coronavirus testing is positive.  [facility] has no staffing to accept  additional patients.  Patient was also too unstable for transfer without being intubated and I feel like this would cause more harm than good for patient.  D/w Dr. Mortimer Fries from the ICU.  He is willing to accept the patient to our ICU for above.     ____________________________________________   FINAL CLINICAL IMPRESSION(S) / ED DIAGNOSES   Final diagnoses:  Acute on chronic respiratory failure with hypoxia (HCC)  Pneumonia of both lungs due to infectious organism, unspecified part of lung     MEDICATIONS GIVEN DURING THIS VISIT:  Medications  diphenhydrAMINE (BENADRYL) capsule 50 mg (has no administration in time range)    Or  diphenhydrAMINE (BENADRYL) injection 50 mg (has no administration in time range)  vancomycin (VANCOCIN) 2,000 mg in sodium chloride 0.9 % 500 mL IVPB (has no administration in time range)  hydrocortisone sodium succinate (SOLU-CORTEF) injection 200 mg (200 mg Intravenous Given 02/02/19 1310)  ipratropium-albuterol (DUONEB) 0.5-2.5 (3) MG/3ML nebulizer solution 3 mL (3 mLs Nebulization Given 02/02/19 1347)  ceFEPIme (MAXIPIME) 2 g in sodium chloride 0.9 % 100 mL IVPB (2 g Intravenous New Bag/Given 02/02/19 1441)     ED Discharge Orders    None       Note:  This document was prepared using Dragon voice recognition software and may include unintentional dictation errors.   Vanessa [place], MD 02/02/19 [phone]

## 2019-02-03 ENCOUNTER — Encounter (HOSPITAL_COMMUNITY): Payer: Self-pay

## 2019-02-03 ENCOUNTER — Inpatient Hospital Stay (HOSPITAL_COMMUNITY)
Admission: AD | Admit: 2019-02-03 | Discharge: 2019-02-10 | DRG: 177 | Disposition: A | Discharge status: Home / Self Care | Source: Other Acute Inpatient Hospital | Attending: Internal Medicine | Admitting: Internal Medicine

## 2019-02-03 ENCOUNTER — Other Ambulatory Visit: Payer: Self-pay

## 2019-02-03 DIAGNOSIS — J069 Acute upper respiratory infection, unspecified: Secondary | ICD-10-CM | POA: Diagnosis present

## 2019-02-03 DIAGNOSIS — K760 Fatty (change of) liver, not elsewhere classified: Secondary | ICD-10-CM | POA: Diagnosis present

## 2019-02-03 DIAGNOSIS — I10 Essential (primary) hypertension: Secondary | ICD-10-CM | POA: Diagnosis present

## 2019-02-03 DIAGNOSIS — J431 Panlobular emphysema: Secondary | ICD-10-CM | POA: Diagnosis not present

## 2019-02-03 DIAGNOSIS — F329 Major depressive disorder, single episode, unspecified: Secondary | ICD-10-CM | POA: Diagnosis present

## 2019-02-03 DIAGNOSIS — E1165 Type 2 diabetes mellitus with hyperglycemia: Secondary | ICD-10-CM | POA: Diagnosis present

## 2019-02-03 DIAGNOSIS — Z8249 Family history of ischemic heart disease and other diseases of the circulatory system: Secondary | ICD-10-CM

## 2019-02-03 DIAGNOSIS — Z66 Do not resuscitate: Secondary | ICD-10-CM | POA: Diagnosis present

## 2019-02-03 DIAGNOSIS — J810 Acute pulmonary edema: Secondary | ICD-10-CM

## 2019-02-03 DIAGNOSIS — Z90711 Acquired absence of uterus with remaining cervical stump: Secondary | ICD-10-CM | POA: Diagnosis not present

## 2019-02-03 DIAGNOSIS — Z882 Allergy status to sulfonamides status: Secondary | ICD-10-CM

## 2019-02-03 DIAGNOSIS — Z7951 Long term (current) use of inhaled steroids: Secondary | ICD-10-CM

## 2019-02-03 DIAGNOSIS — D45 Polycythemia vera: Secondary | ICD-10-CM | POA: Diagnosis present

## 2019-02-03 DIAGNOSIS — Z886 Allergy status to analgesic agent status: Secondary | ICD-10-CM

## 2019-02-03 DIAGNOSIS — E1159 Type 2 diabetes mellitus with other circulatory complications: Secondary | ICD-10-CM | POA: Diagnosis not present

## 2019-02-03 DIAGNOSIS — J9600 Acute respiratory failure, unspecified whether with hypoxia or hypercapnia: Secondary | ICD-10-CM | POA: Diagnosis present

## 2019-02-03 DIAGNOSIS — J1289 Other viral pneumonia: Secondary | ICD-10-CM | POA: Diagnosis present

## 2019-02-03 DIAGNOSIS — E876 Hypokalemia: Secondary | ICD-10-CM | POA: Diagnosis present

## 2019-02-03 DIAGNOSIS — R0602 Shortness of breath: Secondary | ICD-10-CM

## 2019-02-03 DIAGNOSIS — Z87891 Personal history of nicotine dependence: Secondary | ICD-10-CM

## 2019-02-03 DIAGNOSIS — Z888 Allergy status to other drugs, medicaments and biological substances status: Secondary | ICD-10-CM

## 2019-02-03 DIAGNOSIS — Z7189 Other specified counseling: Secondary | ICD-10-CM | POA: Diagnosis not present

## 2019-02-03 DIAGNOSIS — Z6835 Body mass index (BMI) 35.0-35.9, adult: Secondary | ICD-10-CM

## 2019-02-03 DIAGNOSIS — Z881 Allergy status to other antibiotic agents status: Secondary | ICD-10-CM

## 2019-02-03 DIAGNOSIS — E1169 Type 2 diabetes mellitus with other specified complication: Secondary | ICD-10-CM | POA: Diagnosis present

## 2019-02-03 DIAGNOSIS — Z9089 Acquired absence of other organs: Secondary | ICD-10-CM

## 2019-02-03 DIAGNOSIS — E118 Type 2 diabetes mellitus with unspecified complications: Secondary | ICD-10-CM | POA: Diagnosis not present

## 2019-02-03 DIAGNOSIS — Z87442 Personal history of urinary calculi: Secondary | ICD-10-CM

## 2019-02-03 DIAGNOSIS — Z9049 Acquired absence of other specified parts of digestive tract: Secondary | ICD-10-CM | POA: Diagnosis not present

## 2019-02-03 DIAGNOSIS — Z825 Family history of asthma and other chronic lower respiratory diseases: Secondary | ICD-10-CM | POA: Diagnosis not present

## 2019-02-03 DIAGNOSIS — Z90721 Acquired absence of ovaries, unilateral: Secondary | ICD-10-CM

## 2019-02-03 DIAGNOSIS — Z79899 Other long term (current) drug therapy: Secondary | ICD-10-CM

## 2019-02-03 DIAGNOSIS — J9690 Respiratory failure, unspecified, unspecified whether with hypoxia or hypercapnia: Secondary | ICD-10-CM | POA: Diagnosis present

## 2019-02-03 DIAGNOSIS — J9601 Acute respiratory failure with hypoxia: Secondary | ICD-10-CM

## 2019-02-03 DIAGNOSIS — Z9289 Personal history of other medical treatment: Principal | ICD-10-CM

## 2019-02-03 DIAGNOSIS — Z8379 Family history of other diseases of the digestive system: Secondary | ICD-10-CM

## 2019-02-03 DIAGNOSIS — J440 Chronic obstructive pulmonary disease with acute lower respiratory infection: Secondary | ICD-10-CM | POA: Diagnosis present

## 2019-02-03 DIAGNOSIS — E119 Type 2 diabetes mellitus without complications: Secondary | ICD-10-CM | POA: Diagnosis present

## 2019-02-03 DIAGNOSIS — IMO0002 Reserved for concepts with insufficient information to code with codable children: Secondary | ICD-10-CM | POA: Diagnosis present

## 2019-02-03 DIAGNOSIS — J449 Chronic obstructive pulmonary disease, unspecified: Secondary | ICD-10-CM | POA: Diagnosis present

## 2019-02-03 DIAGNOSIS — Z794 Long term (current) use of insulin: Secondary | ICD-10-CM

## 2019-02-03 DIAGNOSIS — U071 COVID-19: Secondary | ICD-10-CM | POA: Diagnosis present

## 2019-02-03 DIAGNOSIS — F320 Major depressive disorder, single episode, mild: Secondary | ICD-10-CM | POA: Diagnosis not present

## 2019-02-03 DIAGNOSIS — J80 Acute respiratory distress syndrome: Secondary | ICD-10-CM

## 2019-02-03 DIAGNOSIS — Z91041 Radiographic dye allergy status: Secondary | ICD-10-CM

## 2019-02-03 DIAGNOSIS — E669 Obesity, unspecified: Secondary | ICD-10-CM | POA: Diagnosis not present

## 2019-02-03 DIAGNOSIS — K5090 Crohn's disease, unspecified, without complications: Secondary | ICD-10-CM | POA: Diagnosis present

## 2019-02-03 DIAGNOSIS — Z23 Encounter for immunization: Secondary | ICD-10-CM | POA: Diagnosis not present

## 2019-02-03 DIAGNOSIS — Z8349 Family history of other endocrine, nutritional and metabolic diseases: Secondary | ICD-10-CM

## 2019-02-03 DIAGNOSIS — E8770 Fluid overload, unspecified: Secondary | ICD-10-CM | POA: Diagnosis present

## 2019-02-03 LAB — CBC
HCT: 42.3 % (ref 36.0–46.0)
Hemoglobin: 13.1 g/dL (ref 12.0–15.0)
MCH: 28.2 pg (ref 26.0–34.0)
MCHC: 31 g/dL (ref 30.0–36.0)
MCV: 91 fL (ref 80.0–100.0)
Platelets: 296 10*3/uL (ref 150–400)
RBC: 4.65 MIL/uL (ref 3.87–5.11)
RDW: 16.1 % — ABNORMAL HIGH (ref 11.5–15.5)
WBC: 10 10*3/uL (ref 4.0–10.5)
nRBC: 0.5 % — ABNORMAL HIGH (ref 0.0–0.2)

## 2019-02-03 LAB — MRSA PCR SCREENING: MRSA by PCR: NEGATIVE

## 2019-02-03 LAB — BASIC METABOLIC PANEL
Anion gap: 12 (ref 5–15)
BUN: 17 mg/dL (ref 8–23)
CO2: 29 mmol/L (ref 22–32)
Calcium: 8 mg/dL — ABNORMAL LOW (ref 8.9–10.3)
Chloride: 102 mmol/L (ref 98–111)
Creatinine, Ser: 0.65 mg/dL (ref 0.44–1.00)
GFR calc Af Amer: >60 mL/min (ref >60)
GFR calc non Af Amer: >60 mL/min (ref >60)
Glucose, Bld: 167 mg/dL — ABNORMAL HIGH (ref 70–99)
Potassium: 3.4 mmol/L — ABNORMAL LOW (ref 3.5–5.1)
Sodium: 143 mmol/L (ref 135–145)

## 2019-02-03 LAB — PROCALCITONIN: Procalcitonin: 0.11 ng/mL

## 2019-02-03 LAB — GLUCOSE, CAPILLARY
Glucose-Capillary: 121 mg/dL — ABNORMAL HIGH (ref 70–99)
Glucose-Capillary: 280 mg/dL — ABNORMAL HIGH (ref 70–99)
Glucose-Capillary: 223 mg/dL — ABNORMAL HIGH (ref 70–99)
Glucose-Capillary: 322 mg/dL — ABNORMAL HIGH (ref 70–99)

## 2019-02-03 LAB — MAGNESIUM: Magnesium: 1.9 mg/dL (ref 1.7–2.4)

## 2019-02-03 LAB — C-REACTIVE PROTEIN: CRP: 16.4 mg/dL — ABNORMAL HIGH (ref <1.0)

## 2019-02-03 LAB — FIBRIN DERIVATIVES D-DIMER (ARMC ONLY): Fibrin derivatives D-dimer (ARMC): 4296.57 ng/mL (FEU) — ABNORMAL HIGH (ref 0.00–499.00)

## 2019-02-03 MED ORDER — ONDANSETRON HCL 4 MG PO TABS: 4.0000 mg | ORAL_TABLET | Freq: Four times a day (QID) | ORAL | Status: DC | PRN

## 2019-02-03 MED ORDER — IPRATROPIUM-ALBUTEROL 20-100 MCG/ACT IN AERS
4.0000 | INHALATION_SPRAY | RESPIRATORY_TRACT | Status: DC
  Filled 2019-02-03: qty 4

## 2019-02-03 MED ORDER — COLCHICINE 0.6 MG PO TABS
0.6000 mg | ORAL_TABLET | Freq: Two times a day (BID) | ORAL | Status: DC
  Filled 2019-02-03 (×2): qty 1

## 2019-02-03 MED ORDER — POTASSIUM CHLORIDE CRYS ER 20 MEQ PO TBCR: 40.0000 meq | EXTENDED_RELEASE_TABLET | Freq: Two times a day (BID) | ORAL | Status: DC

## 2019-02-03 MED ORDER — COLCHICINE 0.6 MG PO TABS
0.6000 mg | ORAL_TABLET | Freq: Two times a day (BID) | ORAL | Status: DC
  Administered 2019-02-03 – 2019-02-10 (×14): 0.6 mg via ORAL
  Filled 2019-02-03 (×17): qty 1

## 2019-02-03 MED ORDER — POTASSIUM CHLORIDE CRYS ER 20 MEQ PO TBCR
40.0000 meq | EXTENDED_RELEASE_TABLET | Freq: Two times a day (BID) | ORAL | Status: DC
  Administered 2019-02-03 – 2019-02-04 (×2): 40 meq via ORAL
  Filled 2019-02-03 (×2): qty 2

## 2019-02-03 MED ORDER — ZINC SULFATE 220 (50 ZN) MG PO CAPS: 220.0000 mg | ORAL_CAPSULE | Freq: Every day | ORAL | Status: DC

## 2019-02-03 MED ORDER — IPRATROPIUM-ALBUTEROL 20-100 MCG/ACT IN AERS
4.0000 | INHALATION_SPRAY | RESPIRATORY_TRACT | Status: DC
  Administered 2019-02-03 – 2019-02-04 (×5): 4 via RESPIRATORY_TRACT
  Filled 2019-02-03: qty 4

## 2019-02-03 MED ORDER — PANTOPRAZOLE SODIUM 40 MG PO TBEC
40.0000 mg | DELAYED_RELEASE_TABLET | Freq: Every day | ORAL | Status: DC
  Administered 2019-02-03: 40 mg via ORAL
  Filled 2019-02-03: qty 1

## 2019-02-03 MED ORDER — BENZONATATE 100 MG PO CAPS: 100.0000 mg | ORAL_CAPSULE | Freq: Three times a day (TID) | ORAL | Status: DC | PRN

## 2019-02-03 MED ORDER — METOPROLOL SUCCINATE ER 50 MG PO TB24
50.0000 mg | ORAL_TABLET | Freq: Every day | ORAL | Status: DC
  Administered 2019-02-03: 50 mg via ORAL
  Filled 2019-02-03: qty 1

## 2019-02-03 MED ORDER — INSULIN ASPART 100 UNIT/ML ~~LOC~~ SOLN
0.0000 [IU] | Freq: Three times a day (TID) | SUBCUTANEOUS | Status: DC
  Administered 2019-02-04: 11:00:00 15 [IU] via SUBCUTANEOUS
  Administered 2019-02-04: 10:00:00 11 [IU] via SUBCUTANEOUS
  Administered 2019-02-04 – 2019-02-05 (×3): 20 [IU] via SUBCUTANEOUS
  Administered 2019-02-05: 17:00:00 15 [IU] via SUBCUTANEOUS
  Administered 2019-02-06: 11 [IU] via SUBCUTANEOUS
  Administered 2019-02-06 (×2): 20 [IU] via SUBCUTANEOUS
  Administered 2019-02-07: 17:00:00 11 [IU] via SUBCUTANEOUS
  Administered 2019-02-07: 20 [IU] via SUBCUTANEOUS
  Administered 2019-02-07: 15 [IU] via SUBCUTANEOUS
  Administered 2019-02-08: 17:00:00 11 [IU] via SUBCUTANEOUS
  Administered 2019-02-08: 7 [IU] via SUBCUTANEOUS
  Administered 2019-02-08 – 2019-02-09 (×3): 4 [IU] via SUBCUTANEOUS
  Administered 2019-02-10: 12:00:00 11 [IU] via SUBCUTANEOUS
  Administered 2019-02-10: 08:00:00 4 [IU] via SUBCUTANEOUS

## 2019-02-03 MED ORDER — ACETAMINOPHEN 325 MG PO TABS: 650.0000 mg | ORAL_TABLET | Freq: Four times a day (QID) | ORAL | Status: AC | PRN

## 2019-02-03 MED ORDER — ONDANSETRON HCL 4 MG/2ML IJ SOLN: 4.0000 mg | Freq: Four times a day (QID) | INTRAMUSCULAR | Status: DC | PRN

## 2019-02-03 MED ORDER — MOMETASONE FURO-FORMOTEROL FUM 200-5 MCG/ACT IN AERO: 2.0000 | INHALATION_SPRAY | Freq: Two times a day (BID) | RESPIRATORY_TRACT | Status: DC

## 2019-02-03 MED ORDER — INSULIN ASPART 100 UNIT/ML ~~LOC~~ SOLN
0.0000 [IU] | Freq: Every day | SUBCUTANEOUS | Status: DC
  Administered 2019-02-03: 22:00:00 4 [IU] via SUBCUTANEOUS
  Administered 2019-02-04: 22:00:00 3 [IU] via SUBCUTANEOUS
  Administered 2019-02-05: 5 [IU] via SUBCUTANEOUS
  Administered 2019-02-06: 3 [IU] via SUBCUTANEOUS
  Administered 2019-02-08: 2 [IU] via SUBCUTANEOUS

## 2019-02-03 MED ORDER — VITAMIN C 500 MG PO TABS
1000.0000 mg | ORAL_TABLET | Freq: Every day | ORAL | Status: DC
  Administered 2019-02-03 – 2019-02-10 (×8): 1000 mg via ORAL
  Filled 2019-02-03 (×8): qty 2

## 2019-02-03 MED ORDER — ACETAMINOPHEN 325 MG PO TABS: 650.0000 mg | ORAL_TABLET | Freq: Four times a day (QID) | ORAL | Status: DC | PRN

## 2019-02-03 MED ORDER — FUROSEMIDE 10 MG/ML IJ SOLN
40.0000 mg | Freq: Once | INTRAMUSCULAR | Status: AC
  Administered 2019-02-03: 40 mg via INTRAVENOUS
  Filled 2019-02-03: qty 4

## 2019-02-03 MED ORDER — IPRATROPIUM-ALBUTEROL 20-100 MCG/ACT IN AERS: 4.0000 | INHALATION_SPRAY | RESPIRATORY_TRACT | Status: DC

## 2019-02-03 MED ORDER — ALBUTEROL SULFATE HFA 108 (90 BASE) MCG/ACT IN AERS: 1.0000 | INHALATION_SPRAY | RESPIRATORY_TRACT | Status: DC | PRN

## 2019-02-03 MED ORDER — MOMETASONE FURO-FORMOTEROL FUM 200-5 MCG/ACT IN AERO
2.0000 | INHALATION_SPRAY | Freq: Two times a day (BID) | RESPIRATORY_TRACT | Status: DC
  Administered 2019-02-03: 2 via RESPIRATORY_TRACT
  Filled 2019-02-03: qty 8.8

## 2019-02-03 MED ORDER — INSULIN ASPART 100 UNIT/ML ~~LOC~~ SOLN: 0.0000 [IU] | Freq: Three times a day (TID) | SUBCUTANEOUS | 11 refills | Status: DC

## 2019-02-03 MED ORDER — COLCHICINE 0.6 MG PO TABS: 0.6000 mg | ORAL_TABLET | Freq: Two times a day (BID) | ORAL | Status: DC

## 2019-02-03 MED ORDER — MOMETASONE FURO-FORMOTEROL FUM 200-5 MCG/ACT IN AERO
2.0000 | INHALATION_SPRAY | Freq: Two times a day (BID) | RESPIRATORY_TRACT | Status: DC
  Administered 2019-02-03 – 2019-02-10 (×14): 2 via RESPIRATORY_TRACT
  Filled 2019-02-03: qty 8.8

## 2019-02-03 MED ORDER — ZINC SULFATE 220 (50 ZN) MG PO CAPS
220.0000 mg | ORAL_CAPSULE | Freq: Every day | ORAL | Status: DC
  Administered 2019-02-03 – 2019-02-10 (×8): 220 mg via ORAL
  Filled 2019-02-03 (×8): qty 1

## 2019-02-03 MED ORDER — ASCORBIC ACID 1000 MG PO TABS: 1000.0000 mg | ORAL_TABLET | Freq: Every day | ORAL | Status: DC

## 2019-02-03 MED ORDER — METHYLPREDNISOLONE SODIUM SUCC 40 MG IJ SOLR
40.0000 mg | Freq: Two times a day (BID) | INTRAMUSCULAR | Status: DC
  Administered 2019-02-03 – 2019-02-06 (×7): 40 mg via INTRAVENOUS
  Filled 2019-02-03 (×7): qty 1

## 2019-02-03 MED ORDER — FUROSEMIDE 10 MG/ML IJ SOLN: 60.0000 mg | Freq: Every day | INTRAMUSCULAR | 0 refills | Status: DC

## 2019-02-03 MED ORDER — FUROSEMIDE 10 MG/ML IJ SOLN
60.0000 mg | Freq: Two times a day (BID) | INTRAMUSCULAR | Status: DC
  Administered 2019-02-03 – 2019-02-04 (×2): 60 mg via INTRAVENOUS
  Filled 2019-02-03 (×2): qty 6

## 2019-02-03 MED ORDER — METHYLPREDNISOLONE SODIUM SUCC 40 MG IJ SOLR: 40.0000 mg | Freq: Two times a day (BID) | INTRAMUSCULAR | Status: DC

## 2019-02-03 MED ORDER — ENOXAPARIN SODIUM 40 MG/0.4ML ~~LOC~~ SOLN
40.0000 mg | Freq: Two times a day (BID) | SUBCUTANEOUS | Status: DC
  Administered 2019-02-04 – 2019-02-10 (×13): 40 mg via SUBCUTANEOUS
  Filled 2019-02-03 (×13): qty 0.4

## 2019-02-03 MED ORDER — PANTOPRAZOLE SODIUM 40 MG PO TBEC
40.0000 mg | DELAYED_RELEASE_TABLET | Freq: Every day | ORAL | Status: DC
  Administered 2019-02-04 – 2019-02-10 (×7): 40 mg via ORAL
  Filled 2019-02-03 (×7): qty 1

## 2019-02-03 MED ORDER — ORAL CARE MOUTH RINSE
15.0000 mL | Freq: Two times a day (BID) | OROMUCOSAL | Status: DC
  Administered 2019-02-03: 15 mL via OROMUCOSAL

## 2019-02-03 MED ORDER — VITAMIN C 500 MG PO TABS: 1000.0000 mg | ORAL_TABLET | Freq: Every day | ORAL | Status: DC

## 2019-02-03 MED ORDER — BUPROPION HCL ER (XL) 300 MG PO TB24
300.0000 mg | ORAL_TABLET | Freq: Every day | ORAL | Status: DC
  Administered 2019-02-03: 300 mg via ORAL
  Filled 2019-02-03: qty 1

## 2019-02-03 MED ORDER — ONDANSETRON HCL 4 MG PO TABS: 4.0000 mg | ORAL_TABLET | Freq: Four times a day (QID) | ORAL | 0 refills | Status: DC | PRN

## 2019-02-03 MED ORDER — TRAMADOL HCL 50 MG PO TABS: 50.0000 mg | ORAL_TABLET | Freq: Four times a day (QID) | ORAL | Status: DC | PRN

## 2019-02-03 MED ORDER — ALBUTEROL SULFATE HFA 108 (90 BASE) MCG/ACT IN AERS
1.0000 | INHALATION_SPRAY | RESPIRATORY_TRACT | Status: DC | PRN
  Filled 2019-02-03: qty 6.7

## 2019-02-03 MED ORDER — AMLODIPINE BESYLATE 5 MG PO TABS
5.0000 mg | ORAL_TABLET | Freq: Every day | ORAL | Status: DC
  Administered 2019-02-03: 5 mg via ORAL
  Filled 2019-02-03: qty 1

## 2019-02-03 MED ORDER — INSULIN ASPART 100 UNIT/ML ~~LOC~~ SOLN
0.0000 [IU] | Freq: Three times a day (TID) | SUBCUTANEOUS | Status: DC
  Administered 2019-02-03: 18:00:00 7 [IU] via SUBCUTANEOUS

## 2019-02-03 MED ORDER — ORAL CARE MOUTH RINSE
15.0000 mL | Freq: Two times a day (BID) | OROMUCOSAL | Status: DC
  Administered 2019-02-03 – 2019-02-10 (×14): 15 mL via OROMUCOSAL

## 2019-02-03 MED ORDER — FUROSEMIDE 10 MG/ML IJ SOLN: 60.0000 mg | Freq: Every day | INTRAMUSCULAR | Status: DC

## 2019-02-03 MED ORDER — HYOSCYAMINE SULFATE 0.125 MG SL SUBL
0.1250 mg | SUBLINGUAL_TABLET | Freq: Two times a day (BID) | SUBLINGUAL | Status: DC | PRN
  Filled 2019-02-03: qty 1

## 2019-02-03 MED ORDER — TIOTROPIUM BROMIDE MONOHYDRATE 2.5 MCG/ACT IN AERS: 2.0000 | INHALATION_SPRAY | Freq: Every day | RESPIRATORY_TRACT | Status: DC

## 2019-02-03 MED ORDER — SODIUM CHLORIDE 0.9 % IV SOLN
INTRAVENOUS | Status: DC | PRN
  Administered 2019-02-03: 400 mL via INTRAVENOUS

## 2019-02-03 MED ORDER — LOSARTAN POTASSIUM 50 MG PO TABS
100.0000 mg | ORAL_TABLET | Freq: Every day | ORAL | Status: DC
  Administered 2019-02-03: 100 mg via ORAL
  Filled 2019-02-03: qty 2

## 2019-02-03 MED ORDER — LINAGLIPTIN 5 MG PO TABS
5.0000 mg | ORAL_TABLET | Freq: Every day | ORAL | Status: DC
  Administered 2019-02-03 – 2019-02-10 (×8): 5 mg via ORAL
  Filled 2019-02-03 (×10): qty 1

## 2019-02-03 MED ORDER — INSULIN ASPART 100 UNIT/ML ~~LOC~~ SOLN: 0.0000 [IU] | Freq: Every day | SUBCUTANEOUS | Status: DC

## 2019-02-03 MED ORDER — VANCOMYCIN HCL 1.5 G IV SOLR
1500.0000 mg | INTRAVENOUS | Status: DC
  Filled 2019-02-03: qty 1500

## 2019-02-03 MED ORDER — POTASSIUM CHLORIDE CRYS ER 20 MEQ PO TBCR
20.0000 meq | EXTENDED_RELEASE_TABLET | ORAL | Status: AC
  Administered 2019-02-03 (×2): 20 meq via ORAL
  Filled 2019-02-03 (×2): qty 1

## 2019-02-03 MED ORDER — SODIUM CHLORIDE 0.9 % IV SOLN: 2.0000 g | Freq: Three times a day (TID) | INTRAVENOUS | Status: DC

## 2019-02-03 MED ORDER — ENOXAPARIN SODIUM 100 MG/ML ~~LOC~~ SOLN: 100.0000 mg | Freq: Two times a day (BID) | SUBCUTANEOUS | Status: DC

## 2019-02-03 MED ORDER — ENOXAPARIN SODIUM 100 MG/ML ~~LOC~~ SOLN
100.0000 mg | Freq: Two times a day (BID) | SUBCUTANEOUS | Status: DC
  Administered 2019-02-03: 100 mg via SUBCUTANEOUS
  Filled 2019-02-03 (×2): qty 1

## 2019-02-03 MED ORDER — FUROSEMIDE 20 MG PO TABS
20.0000 mg | ORAL_TABLET | Freq: Every day | ORAL | Status: DC
  Administered 2019-02-03: 20 mg via ORAL
  Filled 2019-02-03: qty 1

## 2019-02-03 MED ORDER — CHLORHEXIDINE GLUCONATE 0.12 % MT SOLN
15.0000 mL | Freq: Two times a day (BID) | OROMUCOSAL | Status: DC
  Administered 2019-02-03: 15 mL via OROMUCOSAL

## 2019-02-03 MED ORDER — INSULIN ASPART 100 UNIT/ML ~~LOC~~ SOLN: 0.0000 [IU] | Freq: Every day | SUBCUTANEOUS | 11 refills | Status: DC

## 2019-02-03 MED ORDER — ACETAMINOPHEN 650 MG RE SUPP: 650.0000 mg | Freq: Four times a day (QID) | RECTAL | Status: DC | PRN

## 2019-02-03 MED ORDER — ENOXAPARIN SODIUM 40 MG/0.4ML ~~LOC~~ SOLN
40.0000 mg | SUBCUTANEOUS | Status: DC
  Administered 2019-02-03: 19:00:00 40 mg via SUBCUTANEOUS
  Filled 2019-02-03 (×2): qty 0.4

## 2019-02-03 MED ORDER — CHLORHEXIDINE GLUCONATE CLOTH 2 % EX PADS
6.0000 | MEDICATED_PAD | Freq: Every day | CUTANEOUS | Status: DC
  Administered 2019-02-03: 6 via TOPICAL

## 2019-02-03 MED ORDER — CHLORHEXIDINE GLUCONATE 0.12 % MT SOLN: 15.0000 mL | Freq: Two times a day (BID) | OROMUCOSAL | 0 refills | Status: DC

## 2019-02-03 MED ORDER — INSULIN GLARGINE 100 UNIT/ML ~~LOC~~ SOLN
14.0000 [IU] | Freq: Every day | SUBCUTANEOUS | Status: DC
  Administered 2019-02-03: 22:00:00 14 [IU] via SUBCUTANEOUS
  Filled 2019-02-03: qty 0.14

## 2019-02-03 MED ORDER — ALPRAZOLAM 0.25 MG PO TABS: 0.2500 mg | ORAL_TABLET | Freq: Every day | ORAL | Status: DC | PRN

## 2019-02-03 MED ORDER — DEXAMETHASONE 4 MG PO TABS
6.0000 mg | ORAL_TABLET | Freq: Every day | ORAL | Status: DC
  Administered 2019-02-03: 6 mg via ORAL
  Filled 2019-02-03: qty 1.5

## 2019-02-03 MED ORDER — PANTOPRAZOLE SODIUM 40 MG PO TBEC: 40.0000 mg | DELAYED_RELEASE_TABLET | Freq: Every day | ORAL | Status: DC

## 2019-02-03 MED ORDER — TIOTROPIUM BROMIDE MONOHYDRATE 18 MCG IN CAPS
18.0000 ug | ORAL_CAPSULE | Freq: Every day | RESPIRATORY_TRACT | Status: DC
  Administered 2019-02-03: 18 ug via RESPIRATORY_TRACT
  Filled 2019-02-03: qty 5

## 2019-02-03 MED ORDER — METHYLPREDNISOLONE SODIUM SUCC 40 MG IJ SOLR: 40.0000 mg | Freq: Two times a day (BID) | INTRAMUSCULAR | 0 refills | Status: DC

## 2019-02-03 MED ORDER — DOCUSATE SODIUM 100 MG PO CAPS
100.0000 mg | ORAL_CAPSULE | Freq: Two times a day (BID) | ORAL | Status: DC
  Filled 2019-02-03 (×2): qty 1

## 2019-02-03 NOTE — Consult Note (Signed)
NAME:  Amanda Wiley, MRN:  [id number], DOB:  March 19, 1956, LOS: 0 ADMISSION DATE:  02/03/2019, CONSULTATION DATE:  02/03/19 REFERRING MD:  TRH Thereasa Solo, CHIEF COMPLAINT:  Acute hypoxemic respiratory failure with COVID-19   Brief History   63 year old female with history of COPD, DM, HTN and hepatomegally who was admitted on 10/5 to [facility] discharged on the 12 for COVID pneumonia.  Patient was discharged home on home O2 but returns back to [facility] for worsening respiratory status and hypoxemia.  Patient was retested and naturally remained positive and recommendations were made to d/c isolation per [facility] policy but staff felt uncomfortable with that and the patient was sent to [facility] for further management.  PCCM was consulted to assist with hypoxemic failure.  History of present illness   63 year old female with history of COPD, DM, HTN and hepatomegally who was admitted on 10/5 to [facility] discharged on the 12 for COVID pneumonia.  Patient was discharged home on home O2 but returns back to [facility] for worsening respiratory status and hypoxemia.  Patient was retested and naturally remained positive and recommendations were made to d/c isolation per [facility] policy but staff felt uncomfortable with that and the patient was sent to [facility] for further management.  PCCM was consulted to assist with hypoxemic failure.  Patient reports DOE, PND and severe fatigue  Past Medical History  HTN DM HLD Hepatomegaly  Morbid obesity COVID COPD  Significant Hospital Events   N/A  Consults:  PCCM 10/27  Procedures:  N/A  Significant Diagnostic Tests:  Chest CT with contrast on 10/27 with diffuse interstitial changes, right sided pleural effusion and no PE that I reviewed myself  Micro Data:  None  Antimicrobials:  None   Interim history/subjective:  Fatigue and SOB Dry cough  Objective   Blood pressure (!) 142/53, pulse 94, temperature 98.2 F (36.8 C), temperature source Axillary, resp. rate 19, height 5'  5.35" (1.66 m), weight 95.2 kg, SpO2 (!) 88 %.    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  Recommendations: Recutlure only if febrile, WBC and temp are normal, do not recommend Hold off other covid specific treatment for now Steroids as ordered Will not order D-dimer, ESR, CRP...etc. Titrate O2 for sat of 85% or higher No need for further ABGs Discussed code status, patient wishes to be DNR, will alter code status Strict I/O Aggressive diureses  Labs   CBC: Recent Labs  Lab 02/02/19 1246 02/03/19 0404  WBC 9.5 10.0  NEUTROABS 7.4  --   HGB 15.2* 13.1  HCT 49.5* 42.3  MCV 91.2 91.0  PLT 279 357    Basic Metabolic Panel: Recent Labs  Lab 02/02/19 1246 02/03/19 0404  NA 143 143  K 3.9 3.4*  CL 100 102  CO2 28 29  GLUCOSE 260* 167*  BUN 10 17  CREATININE 0.64 0.65  CALCIUM 8.6* 8.0*  MG 2.2 1.9   GFR: Estimated Creatinine Clearance: 81.6 mL/min (by C-G formula based on SCr of 0.65 mg/dL). Recent Labs  Lab 02/02/19 1246 02/02/19 1247 02/02/19 1444 02/03/19 0404  PROCALCITON  --   --  0.14 0.11  WBC 9.5  --   --  10.0  LATICACIDVEN  --  2.0* 1.5  --     Liver Function Tests: Recent Labs  Lab 02/02/19 1246  AST 22  ALT 18  ALKPHOS 90  BILITOT 0.7  PROT 8.0  ALBUMIN 2.5*   No results for input(s): LIPASE, AMYLASE in the last 168 hours. No results for  input(s): AMMONIA in the last 168 hours.  ABG    Component Value Date/Time   PHART 7.50 (H) 02/02/2019 1343   PCO2ART 39 02/02/2019 1343   PO2ART 60 (L) 02/02/2019 1343   HCO3 30.4 (H) 02/02/2019 1343   O2SAT 92.8 02/02/2019 1343     Coagulation Profile: Recent Labs  Lab 02/02/19 1246  INR 1.1    Cardiac Enzymes: No results for input(s): CKTOTAL, CKMB, CKMBINDEX, TROPONINI in the last 168 hours.  HbA1C: Hemoglobin A1C  Date/Time Value Ref Range Status  02/20/2018 08:25 AM 6.7 (A) 4.0 - 5.6 % Final   Hgb A1c MFr Bld  Date/Time Value Ref Range Status  01/13/2019 04:22 AM 7.3 (H) 4.8 - 5.6 % Final    Comment:    (NOTE) Pre diabetes:          5.7%-6.4% Diabetes:              >6.4% Glycemic control for   <7.0% adults with diabetes   02/01/2017 08:01 AM 6.9 (H) 4.6 - 6.5 % Final    Comment:    Glycemic Control Guidelines for People with Diabetes:Non Diabetic:  <6%Goal of Therapy: <7%Additional Action Suggested:  >8%     CBG: Recent Labs  Lab 02/03/19 0016 02/03/19 0744 02/03/19 1237  GLUCAP 280* 121* 223*    Review of Systems:   12 point ROS is negative other than above  Past Medical History  She,  has a past medical history of COPD (chronic obstructive pulmonary disease) ([place]), Crohn's disease ([place]), Depression, Diabetes mellitus ([place]), Fracture, foot, Hypercholesterolemia, Hypertension, IBS (irritable bowel syndrome), and Nephrolithiasis.   Surgical History    Past Surgical History:  Procedure Laterality Date  . ABDOMINAL HYSTERECTOMY  1985  . APPENDECTOMY  1985  . BREAST EXCISIONAL BIOPSY Right 2013   benign  . COLONOSCOPY  2014  . LAPAROSCOPIC CHOLECYSTECTOMY  2005   Dr Tamala Julian  . LAPAROSCOPIC SUPRACERVICAL HYSTERECTOMY  1985   left ovary not removed  . LITHOTRIPSY     x7  . OOPHORECTOMY    . POLYPECTOMY    . SHOULDER SURGERY     right (pinning)  . SHOULDER SURGERY     left (pinning)  . TONSILLECTOMY  1966     Social History   reports that  she quit smoking about 24 years ago. She has a 30.00 pack-year smoking history. She has never used smokeless tobacco. She reports previous alcohol use. She reports that she does not use drugs.   Family History   Her family history includes COPD in her mother and sister; Crohn's disease in her sister; Emphysema in her maternal grandmother, mother, and another family member; Heart disease in her father; Hypercholesterolemia in her mother; Hypertension in her brother and sister. There is no history of Breast cancer or Colon cancer.   Allergies Allergies  Allergen Reactions  . Avelox [Moxifloxacin Hcl In Nacl] Hives, Shortness Of Breath and Other (See Comments)  . Moxifloxacin Hives and Shortness Of Breath  . Allegra [Fexofenadine Hcl] Other (See Comments)    headaches  . Ibuprofen Swelling  . Sulfa Antibiotics Other (See Comments)    Difficulty breathing, rash, tongue swelling  . Tegretol [Carbamazepine] Other (See Comments)    Other reaction(s): Other (See Comments) Vomiting Vomiting   . Iodine Rash  . Ioxaglate Rash  . Ivp Dye [Iodinated Diagnostic Agents] Rash     Home Medications  Prior to Admission medications   Medication Sig Start Date End Date Taking? Authorizing Provider  acetaminophen (TYLENOL) 325 MG tablet Take 2 tablets (650 mg total) by mouth every 6 (six) hours as needed for mild pain (or Fever >/= 101). 02/03/19   Flora Lipps, MD  albuterol (VENTOLIN HFA) 108 (90 Base) MCG/ACT inhaler Inhale 1-2 puffs into the lungs every 4 (four) hours as needed for wheezing or shortness of breath. 02/03/19   Flora Lipps, MD  ceFEPIme 2 g in sodium chloride 0.9 % 100 mL Inject 2 g into the vein every 8 (eight) hours. 02/03/19   Flora Lipps, MD  chlorhexidine (PERIDEX) 0.12 % solution 15 mLs by Mouth Rinse route 2 (two) times daily. 02/03/19   Flora Lipps, MD  colchicine 0.6 MG tablet Take 1 tablet (0.6 mg total) by mouth 2 (two) times daily. 02/03/19   Flora Lipps, MD  enoxaparin  (LOVENOX) 100 MG/ML injection Inject 1 mL (100 mg total) into the skin every 12 (twelve) hours. 02/03/19   Flora Lipps, MD  furosemide (LASIX) 10 MG/ML injection Inject 6 mLs (60 mg total) into the vein daily. 02/03/19   Flora Lipps, MD  insulin aspart (NOVOLOG) 100 UNIT/ML injection Inject 0-9 Units into the skin 3 (three) times daily with meals. 02/03/19   Flora Lipps, MD  insulin aspart (NOVOLOG) 100 UNIT/ML injection Inject 0-5 Units into the skin at bedtime. 02/03/19   Flora Lipps, MD  Ipratropium-Albuterol (COMBIVENT) 20-100 MCG/ACT AERS respimat Inhale 4 puffs into the lungs every 4 (four) hours while awake. 02/03/19   Flora Lipps, MD  methylPREDNISolone sodium succinate (SOLU-MEDROL) 40 mg/mL injection Inject 1 mL (40 mg total) into the vein every 12 (twelve) hours. 02/03/19   Flora Lipps, MD  mometasone-formoterol (DULERA) 200-5 MCG/ACT AERO Inhale 2 puffs into the lungs 2 (two) times daily. 02/03/19   Flora Lipps, MD  ondansetron (ZOFRAN) 4 MG tablet Take 1 tablet (4 mg total) by mouth every 6 (six) hours as needed for nausea. 02/03/19   Flora Lipps, MD  pantoprazole (PROTONIX) 40 MG tablet Take 1 tablet (40 mg total) by mouth daily. 02/04/19   Flora Lipps, MD  potassium chloride SA (KLOR-CON) 20 MEQ tablet Take 2 tablets (40 mEq total) by mouth 2 (two) times daily. 02/03/19   Flora Lipps, MD  vitamin C (VITAMIN C) 1000 MG tablet Take 1 tablet (1,000 mg total) by mouth daily. 02/03/19   Flora Lipps, MD  zinc sulfate 220 (50 Zn) MG capsule Take 1 capsule (220 mg total) by mouth daily. 02/03/19   Flora Lipps, MD    The patient is critically ill with multiple organ systems failure and requires high complexity decision making for assessment and support, frequent evaluation and titration of therapies, application of advanced monitoring technologies and extensive interpretation of multiple databases.   Critical Care Time devoted to patient care services described in this note is  37   Minutes. This  time reflects time of care of this signee Dr Jennet Maduro. This critical care time does not reflect procedure time, or teaching time or supervisory time of PA/NP/Med student/Med Resident etc but could involve care discussion time.  Rush Farmer, M.D. [facility] Pulmonary/Critical Care Medicine.

## 2019-02-03 NOTE — Consult Note (Addendum)
Pharmacy Antibiotic Note  Amanda Wiley is a 63 y.o. female admitted on 02/02/2019 with pneumonia. Patient has also tested positive for COVID19. Pharmacy has been consulted for Remdesivir,Vancomycin and Cefepime dosing to treat both the virus and underlying pneumonia that may be causing respiratory failure. Patient was recently discharged from [facility] and completed full course of Remdesivir. After consulted with both the Hospitalist and Intensivist, the decision was made to not retreat. (-) MRSA PCR; however, per rounding discussion, will continue vancomycin and cefepime.  Plan: -Vancomycin 1500 mg IV Q 24 hrs, day 2 Goal AUC 400-550. Expected AUC: 539.9 Expected Css: 11.2 SCr used: 0.8(actual 0.65)  -Continue Cefepime 2g Q8 hours, day 2  Height: 5' 5.51" (166.4 cm) Weight: 209 lb 3.5 oz (94.9 kg) IBW/kg (Calculated) : 58.18  Temp (24hrs), Avg:98 F (36.7 C), Min:97.2 F (36.2 C), Max:98.9 F (37.2 C)  Recent Labs  Lab 02/02/19 1246 02/02/19 1247 02/02/19 1444 02/03/19 0404  WBC 9.5  --   --  10.0  CREATININE 0.64  --   --  0.65  LATICACIDVEN  --  2.0* 1.5  --     Estimated Creatinine Clearance: 81.8 mL/min (by C-G formula based on SCr of 0.65 mg/dL).    Allergies  Allergen Reactions  . Avelox [Moxifloxacin Hcl In Nacl] Hives, Shortness Of Breath and Other (See Comments)  . Moxifloxacin Hives and Shortness Of Breath  . Allegra [Fexofenadine Hcl] Other (See Comments)    headaches  . Ibuprofen Swelling  . Sulfa Antibiotics Other (See Comments)    Difficulty breathing, rash, tongue swelling  . Tegretol [Carbamazepine] Other (See Comments)    Other reaction(s): Other (See Comments) Vomiting Vomiting   . Iodine Rash  . Ioxaglate Rash  . Ivp Dye [Iodinated Diagnostic Agents] Rash    Antimicrobials this admission: Vancomycin 10/26 >>  Cefepime 10/26 >>    Microbiology results: 10/26 BCx: pending, no growth <24hr 10/26 UCx: pending  10/26 MRSA PCR:  negative 10/26 COVID: positive  Thank you for allowing pharmacy to be a part of this patient's care.  Sallye Lat, PharmD Candidate 02/03/2019 11:10 AM

## 2019-02-03 NOTE — Progress Notes (Signed)
[facility] NAME: Amanda Wiley    MR#:  [id number]  DATE OF BIRTH:  [birth year]  SUBJECTIVE:  Patient in isolation for [place] on HFNC D/c from [place] on 10/12 with COVID positive testing  REVIEW OF SYSTEMS:    Review of Systems  Constitutional: Negative for fever, chills weight loss HENT: Negative for ear pain, nosebleeds, congestion, facial swelling, rhinorrhea, neck pain, neck stiffness and ear discharge.   Respiratory: ++ for cough, shortness of breath, wheezing  Cardiovascular: Negative for chest pain, palpitations and leg swelling.  Gastrointestinal: Negative for heartburn, abdominal pain, vomiting, diarrhea or consitpation Genitourinary: Negative for dysuria, urgency, frequency, hematuria Musculoskeletal: Negative for back pain or joint pain Neurological: Negative for dizziness, seizures, syncope, focal weakness,  numbness and headaches.  Hematological: Does not bruise/bleed easily.  Psychiatric/Behavioral: Negative for hallucinations, confusion, dysphoric mood    Tolerating Diet:yes      DRUG ALLERGIES:   Allergies  Allergen Reactions  . Avelox [Moxifloxacin Hcl In Nacl] Hives, Shortness Of Breath and Other (See Comments)  . Moxifloxacin Hives and Shortness Of Breath  . Allegra [Fexofenadine Hcl] Other (See Comments)    headaches  . Ibuprofen Swelling  . Sulfa Antibiotics Other (See Comments)    Difficulty breathing, rash, tongue swelling  . Tegretol [Carbamazepine] Other (See Comments)    Other reaction(s): Other (See Comments) Vomiting Vomiting   . Iodine Rash  . Ioxaglate Rash  . Ivp Dye [Iodinated Diagnostic Agents] Rash    VITALS:  Blood pressure (!) 124/55, pulse 85, temperature 97.9 F (36.6 C), temperature source Oral, resp. rate (!) 30, height 5' 5.51" (1.664 m), weight 94.9 kg, SpO2 (!) 88 %.  PHYSICAL EXAMINATION:  Constitutional: Appears well-developed and well-nourished. No  distress.on HFNC LIMITED EXAM +COVID POSITVE  Neuro: Alert. CN 2-12 grossly intact. No focal deficits. Skin: Skin is warm and dry. No rash noted. Psychiatric: Normal mood and affect.      LABORATORY PANEL:   CBC Recent Labs  Lab 02/03/19 0404  WBC 10.0  HGB 13.1  HCT 42.3  PLT 296   ------------------------------------------------------------------------------------------------------------------  Chemistries  Recent Labs  Lab 02/02/19 1246 02/03/19 0404  NA 143 143  K 3.9 3.4*  CL 100 102  CO2 28 29  GLUCOSE 260* 167*  BUN 10 17  CREATININE 0.64 0.65  CALCIUM 8.6* 8.0*  MG 2.2 1.9  AST 22  --   ALT 18  --   ALKPHOS 90  --   BILITOT 0.7  --    ------------------------------------------------------------------------------------------------------------------  Cardiac Enzymes No results for input(s): TROPONINI in the last 168 hours. ------------------------------------------------------------------------------------------------------------------  RADIOLOGY:  Dg Chest 1 View  Result Date: 02/02/2019 CLINICAL DATA:  Shortness of breath. EXAM: CHEST  1 VIEW COMPARISON:  January 18, 2019. FINDINGS: Stable cardiomediastinal silhouette. Increased diffuse airspace opacities are noted bilaterally consistent with multifocal pneumonia. No pneumothorax or pleural effusion is noted. Status post right proximal humerus surgical internal fixation. IMPRESSION: Significantly increased bilateral lung opacities are noted consistent with worsening multifocal pneumonia. Electronically Signed   By: Marijo Conception M.D.   On: 02/02/2019 13:47   Ct Angio Chest Pe W And/or Wo Contrast  Result Date: 02/02/2019 CLINICAL DATA:  Complex chest pain EXAM: CT ANGIOGRAPHY CHEST WITH CONTRAST TECHNIQUE: Multidetector CT imaging of the chest was performed using the standard protocol during bolus administration of intravenous contrast. Multiplanar CT image reconstructions and MIPs were obtained to  evaluate the vascular anatomy. CONTRAST:  60m OMNIPAQUE IOHEXOL 350 MG/ML SOLN COMPARISON:  None. FINDINGS: Cardiovascular: Contrast injection is sufficient to demonstrate satisfactory opacification of the pulmonary arteries to the segmental level. There is no pulmonary embolus. The main pulmonary artery is within normal limits for size. There is no CT evidence of acute right heart strain. The aorta demonstrates extensive vascular calcifications. Heart size is normal, without pericardial effusion. Mediastinum/Nodes: --No mediastinal or hilar lymphadenopathy. --No axillary lymphadenopathy. --No supraclavicular lymphadenopathy. --bilateral thyroid nodules are noted. --The esophagus is unremarkable Lungs/Pleura: There are extensive ground-glass bilateral airspace opacities involving all lobes. There are small bilateral pleural effusions, right greater than left. There is no pneumothorax. There is no discrete pulmonary nodule, however evaluation is limited by extensive ground-glass airspace opacities. There is some debris within the trachea. Upper Abdomen: No acute abnormality. Musculoskeletal: No chest wall abnormality. No acute or significant osseous findings. Review of the MIP images confirms the above findings. IMPRESSION: 1. No evidence for pulmonary embolus. 2. Extensive ground-glass airspace opacities involving all lobes of both lungs, with small bilateral pleural effusions, right greater than left. These findings are consistent with the patient's history of viral pneumonia. Pulmonary edema can have a similar appearance. Aortic Atherosclerosis (ICD10-I70.0). Electronically Signed   By: CConstance HolsterM.D.   On: 02/02/2019 17:43     ASSESSMENT AND PLAN:   63y.o. female with a known history of COPD, Crohn's disease, diabetes, hypertension [facility] presented to ED with worsening shortness of breath.  1.  Acute on  chronic respiratory failure with hypoxia due to Pneumonia from COVID Continue high flow nasal cannula and wean as tolerated Continue Cefepime/Vanc (consider d/c if MRSA PCR negative) Continue IV steroids Consider remdesivir  CT Chest  Negative for PE  2.  Essential hypertension: Continue Toprol, Norvasc, losartan.   3. Elevated troponin from demand ischemia Patient has ruled out for ACS.   4 COPD without acute exacerbation: Continue Inhalers and Nebs PRN  6.  Depression/Anxiety: Coontinue Xanax, Wellbutrin.  7.  Diabetes type 2 without complication D/w nursing    Management plans discussed with the patient and she is in agreement.  CODE STATUS: full  TOTAL TIME TAKING CARE OF THIS PATIENT: 25 minutes.     POSSIBLE D/C 3-5 days, DEPENDING ON CLINICAL CONDITION.   SBettey CostaM.D on 02/03/2019 at 11:40 AM  Between 7am to 6pm - Pager - [phone] After 6pm go to [email or web address] - password [email or web address]  Office  [phone] CC: Primary care physician; SEinar Pheasant MD  Note: This dictation was prepared with Dragon dictation along with smaller phrase technology. Any transcriptional errors that result from this process are unintentional.

## 2019-02-03 NOTE — Progress Notes (Signed)
Clinical status relayed to patient.  Updated and notified of patients medical condition-  Progressive Resp failure and lung disease Related to COVID infection and COPD I discussed CODE STATUS with patient  She is alert and awake, follows commands   The patient understands her  situation.  Patient has consented and agreed to DNR/DNI status. She has also consented/agreed to transfer to [facility]   Patient satisfied with Plan of action and management. All questions answered  Corrin Parker, M.D.  [facility]  Medical Director [place] Director [facility] Cardio-Pulmonary Department

## 2019-02-03 NOTE — H&P (Signed)
History and Physical    Amanda Wiley CWU:[id number] DOB: 05-Dec-1955 DOA: 02/03/2019  PCP: Einar Pheasant, MD Patient coming from: [facility] ICU   Chief Complaint: SOB  HPI: 63 year old with a history of COPD, DM 2, HTN, HLD, hepatomegaly, and obesity who was originally admitted to [facility] 10/5 > 10/12 for COVID PNA w/ prolonged hypoxic resp failure. She was d/c home newly on O2 at 5LPM. Since returning home, she has not thrived. She remains stable when at rest, but has had signif difficulty w/ dyspnea w/ even the slightest of exertion, not markedly unlike she was experiencing at the time of her d/c. Over the few days prior to her re-admit she began to experience worsening of her DOE. She consulted w/ her PCP, who advised her to seek attention in the [facility] ED.   At the [facility] ED she underwent a CTa of the chest which did not reveal PE, but was remarkable for considerable persisiting B parenchymal change. Given the extent of her dyspnea, and her saturations in the 80s, she was admitted to the [facility] ICU.   Based upon the timing of her prior COVID testing and active sx, it was discussed w/ the team at [facility] that she was no longer an infectious risk in regard to [place] (as defined by ID). Nonetheless, the staff at [facility] felt most comfortable keeping her on isolation. Due to this fact it was felt to be in her best interest to transfer to [facility] to facilitate effective care.   Significant Events: 10/1 first COVID+ test  10/5 admit to [facility] w/ COVID pneumonia  10/12 D/C from [place] on home 5L O2    Assessment/Plan  Recurrent acute hypoxic respiratory failure Pulmonary consultation -Remdesivir and convalescent plasma have no role at this stage of her illness -trial of steroid therapy -aggressive diuresis  Recent Covid pneumonia No viral specific therapies appropriate at this time   Recent Labs  Lab 02/02/19 1246 02/02/19 1444 02/03/19 0104 02/03/19 0404  CRP  --   --  16.4*  --   ALT 18  --   --   --     PROCALCITON  --  0.14  --  0.11    DM 2 Utilize SSI and modified diet and follow CBG  COPD Cont home inhaler tx - no active wheezing at this time - follow clinically   HTN BP reasonably controlled at this time - follow   Hepatic steatosis /hepatomegaly A chronic issue   Polycythemia vera Hgb stable in a normal range at this time   Morbid obesity - Body mass index is 34.55 kg/m.   DVT prophylaxis: lovenox  Code Status: NO CODE BLUE - DNR  Family Communication: spoke w/ multiple family members via phone  Disposition Plan: ICU  Consults called: PCCM   Review of Systems: As per HPI otherwise 10 point review of systems negative.   Past Medical History:  Diagnosis Date   COPD (chronic obstructive pulmonary disease) ([place])    Crohn's disease ([place])    Depression    Diabetes mellitus ([place])    Fracture, foot    post fall   Hypercholesterolemia    Hypertension    IBS (irritable bowel syndrome)    Nephrolithiasis    s/p lithotripsy ([place])    Past Surgical History:  Procedure Laterality Date   [place]   BREAST EXCISIONAL BIOPSY Right 2013   benign   COLONOSCOPY  2014   LAPAROSCOPIC CHOLECYSTECTOMY  2005   Dr Tamala Julian   LAPAROSCOPIC SUPRACERVICAL HYSTERECTOMY  1985   left ovary not removed   LITHOTRIPSY     x7   OOPHORECTOMY     POLYPECTOMY     SHOULDER SURGERY     right (pinning)   SHOULDER SURGERY     left (pinning)   TONSILLECTOMY  1966    Family History  Family History  Problem Relation Age of Onset   Heart disease Father    Emphysema Maternal Grandmother    COPD Mother    Hypercholesterolemia Mother    Emphysema Mother    Hypertension Brother    Emphysema Other        grandmother   COPD Sister    Crohn's disease Sister    Hypertension Sister    Breast cancer Neg Hx    Colon cancer Neg Hx     Social History   reports that she quit smoking about 24 years  ago. She has a 30.00 pack-year smoking history. She has never used smokeless tobacco. She reports previous alcohol use. She reports that she does not use drugs.  Allergies Allergies  Allergen Reactions   Avelox [Moxifloxacin Hcl In Nacl] Hives, Shortness Of Breath and Other (See Comments)   Moxifloxacin Hives and Shortness Of Breath   Allegra [Fexofenadine Hcl] Other (See Comments)    headaches   Ibuprofen Swelling   Sulfa Antibiotics Other (See Comments)    Difficulty breathing, rash, tongue swelling   Tegretol [Carbamazepine] Other (See Comments)    Other reaction(s): Other (See Comments) Vomiting Vomiting    Iodine Rash   Ioxaglate Rash   Ivp Dye [Iodinated Diagnostic Agents] Rash    Prior to Admission medications   Medication Sig Start Date End Date Taking? Authorizing Provider  acetaminophen (TYLENOL) 325 MG tablet Take 2 tablets (650 mg total) by mouth every 6 (six) hours as needed for mild pain or headache (fever >/= 101). 01/19/19  Yes Cherene Altes, MD  albuterol (PROVENTIL) (2.5 MG/3ML) 0.083% nebulizer solution Take 3 mLs (2.5 mg total) by nebulization every 6 (six) hours as needed for wheezing. 01/30/19  Yes Einar Pheasant, MD  albuterol (VENTOLIN HFA) 108 (90 Base) MCG/ACT inhaler Inhale 2 puffs into the lungs every 6 (six) hours as needed. 01/30/19  Yes Einar Pheasant, MD  ALPRAZolam Duanne Moron) 0.25 MG tablet Take 1 tablet (0.25 mg total) by mouth daily as needed for anxiety. 02/11/18  Yes Einar Pheasant, MD  amLODipine (NORVASC) 5 MG tablet Take 1 tablet (5 mg total) by mouth daily. 11/13/18  Yes Einar Pheasant, MD  benzonatate (TESSALON) 100 MG capsule Take 1 capsule (100 mg total) by mouth 3 (three) times daily as needed for cough. 01/23/19  Yes Einar Pheasant, MD  buPROPion (WELLBUTRIN XL) 300 MG 24 hr tablet Take 1 tablet (300 mg total) by mouth daily. 11/13/18  Yes Einar Pheasant, MD  Dulaglutide (TRULICITY) 1.5 KW/4.0XB SOPN Inject 1.5 mg into the skin  every 7 (seven) days.  05/12/18  Yes [provider]  empagliflozin (JARDIANCE) 25 MG TABS tablet Take 25 mg by mouth daily.    Yes [provider]  fluticasone-salmeterol (ADVAIR HFA) 230-21 MCG/ACT inhaler Inhale 2 puffs into the lungs 2 (two) times daily. 05/09/18  Yes Flora Lipps, MD  furosemide (LASIX) 20 MG tablet Take 1 tablet (20 mg total) by mouth daily. 11/13/18  Yes Einar Pheasant, MD  hyoscyamine (LEVSIN SL) 0.125 MG SL tablet Place 1 tablet (0.125 mg total) under the tongue 2 (  two) times daily as needed. 11/13/18  Yes Einar Pheasant, MD  losartan (COZAAR) 100 MG tablet Take 1 tablet (100 mg total) by mouth daily. 11/17/18  Yes Einar Pheasant, MD  metFORMIN (GLUCOPHAGE) 500 MG tablet 2 tablets bid Patient taking differently: Take 1,000 mg by mouth 2 (two) times daily with a meal.  11/13/18  Yes Einar Pheasant, MD  metoprolol succinate (TOPROL-XL) 50 MG 24 hr tablet Take 1 tablet (50 mg total) by mouth daily. Take with or immediately following a meal. 11/13/18  Yes Einar Pheasant, MD  montelukast (SINGULAIR) 10 MG tablet Take 1 tablet (10 mg total) by mouth daily. Patient taking differently: Take 10 mg by mouth at bedtime.  11/13/18  Yes Einar Pheasant, MD  RABEprazole (ACIPHEX) 20 MG tablet Take 1 tablet (20 mg total) by mouth 2 (two) times daily before a meal. 11/13/18  Yes Einar Pheasant, MD  SPIRIVA RESPIMAT 2.5 MCG/ACT AERS USE 2 INHALATIONS DAILY Patient taking differently: Take 2 puffs by mouth daily.  02/04/18  Yes Flora Lipps, MD    Physical Exam: Vitals:   02/03/19 1546 02/03/19 1600  BP: (!) 142/53 (!) 142/53  Pulse: 94 94  Resp: (!) 25 19  Temp: 98.2 F (36.8 C)   TempSrc: Axillary   SpO2: (!) 89% (!) 88%  Weight: 95.2 kg   Height: 5' 5.35" (1.66 m)     Constitutional: NAD, calm, comfortable Eyes: PERRL, lids and conjunctivae normal ENMT: Mucous membranes are moist. Neck: normal, supple, no masses, no thyromegaly Respiratory: Fine crackles in  apparently diffusely bilateral fields and no wheezing Cardiovascular: Regular rate and rhythm, no murmurs / rubs / gallops. No extremity edema. 2+ pedal pulses.  Or please feel Abdomen: no tenderness, no masses palpated. No hepatosplenomegaly. Bowel sounds positive.  Musculoskeletal: no clubbing / cyanosis. No joint deformity upper and lower extremities. Good ROM, no contractures. Normal muscle tone.  Neurologic: CN 2-12 grossly intact. Sensation intact, DTR normal. Strength 5/5 in all 4.  Psychiatric: Normal judgment and insight. Alert and oriented x 3. Normal mood.    Labs on Admission:   CBC: Recent Labs  Lab 02/02/19 1246 02/03/19 0404  WBC 9.5 10.0  NEUTROABS 7.4  --   HGB 15.2* 13.1  HCT 49.5* 42.3  MCV 91.2 91.0  PLT 279 283   Basic Metabolic Panel: Recent Labs  Lab 02/02/19 1246 02/03/19 0404  NA 143 143  K 3.9 3.4*  CL 100 102  CO2 28 29  GLUCOSE 260* 167*  BUN 10 17  CREATININE 0.64 0.65  CALCIUM 8.6* 8.0*  MG 2.2 1.9   GFR: Estimated Creatinine Clearance: 81.6 mL/min (by C-G formula based on SCr of 0.65 mg/dL). Liver Function Tests: Recent Labs  Lab 02/02/19 1246  AST 22  ALT 18  ALKPHOS 90  BILITOT 0.7  PROT 8.0  ALBUMIN 2.5*   Coagulation Profile: Recent Labs  Lab 02/02/19 1246  INR 1.1   CBG: Recent Labs  Lab 02/03/19 0016 02/03/19 0744 02/03/19 1237  GLUCAP 280* 121* 223*   Urine analysis:    Component Value Date/Time   COLORURINE STRAW (A) 02/02/2019 1343   APPEARANCEUR CLEAR (A) 02/02/2019 1343   LABSPEC 1.013 02/02/2019 1343   PHURINE 5.0 02/02/2019 1343   GLUCOSEU >=500 (A) 02/02/2019 1343   GLUCOSEU 100 (A) 07/16/2013 1446   HGBUR NEGATIVE 02/02/2019 [place] 02/02/2019 1343   BILIRUBINUR neg 07/06/2015 [place] 02/02/2019 [place] 02/02/2019 1343  UROBILINOGEN 0.2 07/06/2015 1150   UROBILINOGEN 0.2 07/16/2013 1446   NITRITE NEGATIVE 02/02/2019 1343   LEUKOCYTESUR  NEGATIVE 02/02/2019 1343    Recent Results (from the past 240 hour(s))  Blood culture (routine x 2)     Status: None (Preliminary result)   Collection Time: 02/02/19 12:46 PM   Specimen: BLOOD  Result Value Ref Range Status   Specimen Description BLOOD LEFT ANTECUBITAL  Final   Special Requests   Final    BOTTLES DRAWN AEROBIC AND ANAEROBIC Blood Culture adequate volume   Culture   Final    NO GROWTH < 24 HOURS Performed at [facility], [address], [place], [place] [address]    Report Status PENDING  Incomplete  SARS Coronavirus 2 by RT PCR (hospital order, performed in [place] hospital lab) Nasopharyngeal Nasopharyngeal Swab     Status: Abnormal   Collection Time: 02/02/19 12:47 PM   Specimen: Nasopharyngeal Swab  Result Value Ref Range Status   SARS Coronavirus 2 POSITIVE (A) NEGATIVE Final    Comment: RESULT CALLED TO, READ BACK BY AND VERIFIED WITH:  AMBER PAYNE AT 6045, 02/02/2019 SDR (NOTE) If result is NEGATIVE SARS-CoV-2 target nucleic acids are NOT DETECTED. The SARS-CoV-2 RNA is generally detectable in upper and lower  respiratory specimens during the acute phase of infection. The lowest  concentration of SARS-CoV-2 viral copies this assay can detect is 250  copies / mL. A negative result does not preclude SARS-CoV-2 infection  and should not be used as the sole basis for treatment or other  patient management decisions.  A negative result may occur with  improper specimen collection / handling, submission of specimen other  than nasopharyngeal swab, presence of viral mutation(s) within the  areas targeted by this assay, and inadequate number of viral copies  (<250 copies / mL). A negative result must be combined with clinical  observations, patient history, and epidemiological information. If result is POSITIVE SARS-CoV-2 target nucleic acids are DETECTED.  The SARS-CoV-2 RNA is generally detectable in upper and lower  respiratory specimens  during the acute phase of infection.  Positive  results are indicative of active infection with SARS-CoV-2.  Clinical  correlation with patient history and other diagnostic information is  necessary to determine patient infection status.  Positive results do  not rule out bacterial infection or co-infection with other viruses. If result is PRESUMPTIVE POSTIVE SARS-CoV-2 nucleic acids MAY BE PRESENT.   A presumptive positive result was obtained on the submitted specimen  and confirmed on repeat testing.  While 2019 novel coronavirus  (SARS-CoV-2) nucleic acids may be present in the submitted sample  additional confirmatory testing may be necessary for epidemiological  and / or clinical management purposes  to differentiate between  SARS-CoV-2 and other Sarbecovirus currently known to infect humans.  If clinically indicated additional testing with an alternate test  methodology [phone]) i s advised. The SARS-CoV-2 RNA is generally  detectable in upper and lower respiratory specimens during the acute  phase of infection. The expected result is Negative. Fact Sheet for Patients:  StrictlyIdeas.no Fact Sheet for Healthcare Providers: BankingDealers.co.za This test is not yet approved or cleared by the [place] FDA and has been authorized for detection and/or diagnosis of SARS-CoV-2 by FDA under an Emergency Use Authorization (EUA).  This EUA will remain in effect (meaning this test can be used) for the duration of the COVID-19 declaration under Section 564(b)(1) of the Act, 21 U.S.C. section 360bbb-3(b)(1), unless the authorization is  terminated or revoked sooner. Performed at [facility], [address] [place], [place] [address]   Blood culture (routine x 2)     Status: None (Preliminary result)   Collection Time: 02/02/19  1:04 PM   Specimen: BLOOD  Result Value Ref Range Status   Specimen Description BLOOD RIGHT [facility]   Final   Special Requests   Final    BOTTLES DRAWN AEROBIC AND ANAEROBIC Blood Culture adequate volume   Culture   Final    NO GROWTH < 24 HOURS Performed at [facility], [address] [place], [place] [address]    Report Status PENDING  Incomplete  MRSA PCR Screening     Status: None   Collection Time: 02/03/19 12:24 AM   Specimen: Nasal Mucosa; Nasopharyngeal  Result Value Ref Range Status   MRSA by PCR NEGATIVE NEGATIVE Final    Comment:        The GeneXpert MRSA Assay (FDA approved for NASAL specimens only), is one component of a comprehensive MRSA colonization surveillance program. It is not intended to diagnose MRSA infection nor to guide or monitor treatment for MRSA infections. Performed at [facility], [address] [place], [place] [address]      Radiological Exams on Admission: Dg Chest 1 View  Result Date: 02/02/2019 CLINICAL DATA:  Shortness of breath. EXAM: CHEST  1 VIEW COMPARISON:  January 18, 2019. FINDINGS: Stable cardiomediastinal silhouette. Increased diffuse airspace opacities are noted bilaterally consistent with multifocal pneumonia. No pneumothorax or pleural effusion is noted. Status post right proximal humerus surgical internal fixation. IMPRESSION: Significantly increased bilateral lung opacities are noted consistent with worsening multifocal pneumonia. Electronically Signed   By: Marijo Conception M.D.   On: 02/02/2019 13:47   Ct Angio Chest Pe W And/or Wo Contrast  Result Date: 02/02/2019 CLINICAL DATA:  Complex chest pain EXAM: CT ANGIOGRAPHY CHEST WITH CONTRAST TECHNIQUE: Multidetector CT imaging of the chest was performed using the standard protocol during bolus administration of intravenous contrast. Multiplanar CT image reconstructions and MIPs were obtained to evaluate the vascular anatomy. CONTRAST:  67m OMNIPAQUE IOHEXOL 350 MG/ML SOLN COMPARISON:  None. FINDINGS: Cardiovascular: Contrast injection is sufficient to  demonstrate satisfactory opacification of the pulmonary arteries to the segmental level. There is no pulmonary embolus. The main pulmonary artery is within normal limits for size. There is no CT evidence of acute right heart strain. The aorta demonstrates extensive vascular calcifications. Heart size is normal, without pericardial effusion. Mediastinum/Nodes: --No mediastinal or hilar lymphadenopathy. --No axillary lymphadenopathy. --No supraclavicular lymphadenopathy. --bilateral thyroid nodules are noted. --The esophagus is unremarkable Lungs/Pleura: There are extensive ground-glass bilateral airspace opacities involving all lobes. There are small bilateral pleural effusions, right greater than left. There is no pneumothorax. There is no discrete pulmonary nodule, however evaluation is limited by extensive ground-glass airspace opacities. There is some debris within the trachea. Upper Abdomen: No acute abnormality. Musculoskeletal: No chest wall abnormality. No acute or significant osseous findings. Review of the MIP images confirms the above findings. IMPRESSION: 1. No evidence for pulmonary embolus. 2. Extensive ground-glass airspace opacities involving all lobes of both lungs, with small bilateral pleural effusions, right greater than left. These findings are consistent with the patient's history of viral pneumonia. Pulmonary edema can have a similar appearance. Aortic Atherosclerosis (ICD10-I70.0). Electronically Signed   By: CConstance HolsterM.D.   On: 02/02/2019 17:43    JCherene Altes MD [facility] Office  [phone] - Text Page per AShea Evansas  per below:  On-Call/Text Page:      [email or web address]  If 7PM-7AM, please contact night-coverage [email or web address] 02/03/2019, 6:04 PM

## 2019-02-03 NOTE — Progress Notes (Signed)
PT switched to 100% NRB and 10L [place] per dr Mortimer Fries, tolerating well at this time. Respiratory rate 28/min and sats 90%. Tolerating well at this time. Pt very anxious about being transferred to [facility]. Will notify dr Mortimer Fries.

## 2019-02-03 NOTE — Discharge Summary (Signed)
Physician Discharge Summary  [name] MRN: [id number] DOB/AGE: 11-11-1955 63 y.o.  Admit date: 02/02/2019 Discharge date: 02/03/2019  Admission Diagnoses:b/l pneumonia, COVID infection resp failure  Discharge Diagnoses: pneumonia, resp failure Active Problems:   Acute on chronic respiratory failure with hypoxia (HCC)   Respiratory failure [facility])    Hospital Course:  Acute hypoxic respiratory failure secondary to multifocal pneumonia, ARDS. vs. pulmonary edema and COVID-19 positive (dx 01/10/2019) Hx: COPD Supplemental O2 for dyspnea and/or hypoxia  100% NRB mask and [place] Scheduled  bronchodilator therapy  IVsteroids Trend WBC and monitor fever curve Trend PCT  Follow cultures Continue vancomycin and cefepime for now  Maintain airborne and droplet precautions  iv lasix as bp tolerates   Patient is DNR/DNI     Significant Diagnostic Studies:Ct chest NEG for PE    Discharge Exam: Blood pressure 133/60, pulse 100, temperature 97.7 F (36.5 C), temperature source Oral, resp. rate (!) 32, height 5' 5.51" (1.664 m), weight 94.9 kg, SpO2 (!) 82 %.   Disposition:  [place]   DISCHARGE MEDS    Current Facility-Administered Medications:  .  0.9 %  sodium chloride infusion, , Intravenous, PRN, Awilda Bill, NP, Last Rate: 10 mL/hr at 02/03/19 1200 .  acetaminophen (TYLENOL) tablet 650 mg, 650 mg, Oral, Q6H PRN **OR** acetaminophen (TYLENOL) suppository 650 mg, 650 mg, Rectal, Q6H PRN, Sainani, Vivek J, MD .  albuterol (VENTOLIN HFA) 108 (90 Base) MCG/ACT inhaler 1-2 puff, 1-2 puff, Inhalation, Q4H PRN, Awilda Bill, NP .  ALPRAZolam Duanne Moron) tablet 0.25 mg, 0.25 mg, Oral, Daily PRN, [place] Carmine, Vivek J, MD .  amLODipine (NORVASC) tablet 5 mg, 5 mg, Oral, Daily, Henreitta Leber, MD, 5 mg at 02/03/19 0925 .  benzonatate (TESSALON) capsule 100 mg, 100 mg, Oral, TID PRN, Henreitta Leber, MD .  buPROPion (WELLBUTRIN XL) 24 hr tablet 300 mg, 300 mg, Oral,  Daily, Henreitta Leber, MD, 300 mg at 02/03/19 0927 .  ceFEPIme (MAXIPIME) 2 g in sodium chloride 0.9 % 100 mL IVPB, 2 g, Intravenous, Q8H, Nazari, Walid A, RPH, Stopped at 02/03/19 1016 .  chlorhexidine (PERIDEX) 0.12 % solution 15 mL, 15 mL, Mouth Rinse, BID, Awilda Bill, NP, 15 mL at 02/03/19 0928 .  Chlorhexidine Gluconate Cloth 2 % PADS 6 each, 6 each, Topical, Daily, Awilda Bill, NP, 6 each at 02/03/19 0023 .  colchicine tablet 0.6 mg, 0.6 mg, Oral, BID, Dquan Cortopassi, MD .  enoxaparin (LOVENOX) injection 100 mg, 100 mg, Subcutaneous, Q12H, Sainani, Vivek J, MD, 100 mg at 02/03/19 0241 .  furosemide (LASIX) injection 60 mg, 60 mg, Intravenous, Daily, Trachelle Low, MD .  hyoscyamine (LEVSIN SL) SL tablet 0.125 mg, 0.125 mg, Sublingual, BID PRN, Sainani, Vivek J, MD .  insulin aspart (novoLOG) injection 0-5 Units, 0-5 Units, Subcutaneous, QHS, Henreitta Leber, MD, 3 Units at 02/03/19 0049 .  insulin aspart (novoLOG) injection 0-9 Units, 0-9 Units, Subcutaneous, TID WC, Henreitta Leber, MD, 3 Units at 02/03/19 1240 .  Ipratropium-Albuterol (COMBIVENT) respimat 4 puff, 4 puff, Inhalation, Q4H while awake, Annalysa Mohammad, MD .  losartan (COZAAR) tablet 100 mg, 100 mg, Oral, Daily, Henreitta Leber, MD, 100 mg at 02/03/19 0931 .  MEDLINE mouth rinse, 15 mL, Mouth Rinse, q12n4p, Blakeney, Dana G, NP, 15 mL at 02/03/19 0931 .  methylPREDNISolone sodium succinate (SOLU-MEDROL) 40 mg/mL injection 40 mg, 40 mg, Intravenous, Q12H, Jacque Garrels, MD .  metoprolol succinate (TOPROL-XL) 24 hr tablet 50 mg,  50 mg, Oral, Daily, Henreitta Leber, MD, 50 mg at 02/03/19 0926 .  mometasone-formoterol (DULERA) 200-5 MCG/ACT inhaler 2 puff, 2 puff, Inhalation, BID, Henreitta Leber, MD, 2 puff at 02/03/19 [phone] .  ondansetron (ZOFRAN) tablet 4 mg, 4 mg, Oral, Q6H PRN **OR** ondansetron (ZOFRAN) injection 4 mg, 4 mg, Intravenous, Q6H PRN, Sainani, Vivek J, MD .  pantoprazole (PROTONIX) EC tablet 40 mg, 40 mg,  Oral, Daily, Henreitta Leber, MD, 40 mg at 02/03/19 0926 .  potassium chloride SA (KLOR-CON) CR tablet 40 mEq, 40 mEq, Oral, BID, Nazair Fortenberry, MD .  vancomycin (VANCOCIN) 1,500 mg in sodium chloride 0.9 % 500 mL IVPB, 1,500 mg, Intravenous, Q24H, Dallie Piles, RPH .  vitamin C (ASCORBIC ACID) tablet 1,000 mg, 1,000 mg, Oral, Daily, Manhattan Mccuen, MD .  zinc sulfate capsule 220 mg, 220 mg, Oral, Daily, Flora Lipps, MD     Signed: Flora Lipps 02/03/2019, 1:42 PM

## 2019-02-03 NOTE — Plan of Care (Signed)
  Problem: Education: Goal: Knowledge of risk factors and measures for prevention of condition will improve Outcome: Progressing   Problem: Coping: Goal: Psychosocial and spiritual needs will be supported Outcome: Progressing   Problem: Respiratory: Goal: Will maintain a patent airway Outcome: Progressing   Problem: Education: Goal: Knowledge of General Education information will improve Description: Including pain rating scale, medication(s)/side effects and non-pharmacologic comfort measures Outcome: Progressing

## 2019-02-03 NOTE — Progress Notes (Signed)
Patient with recurrent and worsening acute hypoxic respiratory failure.  Patient recently treated for SARS COVID-19 viral pneumonia, discharge home 2 weeks ago on 5 L of supplemental oxygen per nasal cannula.  At home she experienced worsening dyspnea that prompted her to return to the hospital.  She has been admitted to the intensive care unit, she does have high oxygen requirements, currently on high flow nasal cannula 40 L/min, 99% FiO2.  With a respiratory rate of 29-30. Reported no dyspnea.   Her chest radiograph has diffuse interstitial and alveolar infiltrates bilaterally.   Patient will be placed on nonrebreather 100% FiO2 along with supplemental oxygen per nasal cannula, if her oxygen saturation remains above 88 and has no signs of respiratory distress patient will be transported to [facility].  ICU bed requested.

## 2019-02-04 ENCOUNTER — Inpatient Hospital Stay: Payer: Self-pay

## 2019-02-04 DIAGNOSIS — J069 Acute upper respiratory infection, unspecified: Secondary | ICD-10-CM

## 2019-02-04 DIAGNOSIS — E1165 Type 2 diabetes mellitus with hyperglycemia: Secondary | ICD-10-CM

## 2019-02-04 DIAGNOSIS — J449 Chronic obstructive pulmonary disease, unspecified: Secondary | ICD-10-CM | POA: Diagnosis present

## 2019-02-04 DIAGNOSIS — E118 Type 2 diabetes mellitus with unspecified complications: Secondary | ICD-10-CM | POA: Diagnosis present

## 2019-02-04 DIAGNOSIS — K760 Fatty (change of) liver, not elsewhere classified: Secondary | ICD-10-CM | POA: Diagnosis present

## 2019-02-04 DIAGNOSIS — IMO0002 Reserved for concepts with insufficient information to code with codable children: Secondary | ICD-10-CM | POA: Diagnosis present

## 2019-02-04 DIAGNOSIS — J9601 Acute respiratory failure with hypoxia: Secondary | ICD-10-CM | POA: Diagnosis present

## 2019-02-04 DIAGNOSIS — I10 Essential (primary) hypertension: Secondary | ICD-10-CM

## 2019-02-04 LAB — GLUCOSE, CAPILLARY
Glucose-Capillary: 342 mg/dL — ABNORMAL HIGH (ref 70–99)
Glucose-Capillary: 256 mg/dL — ABNORMAL HIGH (ref 70–99)
Glucose-Capillary: 340 mg/dL — ABNORMAL HIGH (ref 70–99)
Glucose-Capillary: 381 mg/dL — ABNORMAL HIGH (ref 70–99)
Glucose-Capillary: 292 mg/dL — ABNORMAL HIGH (ref 70–99)

## 2019-02-04 LAB — CBC WITH DIFFERENTIAL/PLATELET
Abs Immature Granulocytes: 0.1 10*3/uL — ABNORMAL HIGH (ref 0.00–0.07)
Basophils Absolute: 0 10*3/uL (ref 0.0–0.1)
Basophils Relative: 0 %
Eosinophils Absolute: 0 10*3/uL (ref 0.0–0.5)
Eosinophils Relative: 0 %
HCT: 45.8 % (ref 36.0–46.0)
Hemoglobin: 14.1 g/dL (ref 12.0–15.0)
Immature Granulocytes: 1 %
Lymphocytes Relative: 12 %
Lymphs Abs: 1 10*3/uL (ref 0.7–4.0)
MCH: 28.3 pg (ref 26.0–34.0)
MCHC: 30.8 g/dL (ref 30.0–36.0)
MCV: 91.8 fL (ref 80.0–100.0)
Monocytes Absolute: 0.6 10*3/uL (ref 0.1–1.0)
Monocytes Relative: 8 %
Neutro Abs: 6.2 10*3/uL (ref 1.7–7.7)
Neutrophils Relative %: 79 %
Platelets: 252 10*3/uL (ref 150–400)
RBC: 4.99 MIL/uL (ref 3.87–5.11)
RDW: 16.3 % — ABNORMAL HIGH (ref 11.5–15.5)
WBC: 7.9 10*3/uL (ref 4.0–10.5)
nRBC: 0 % (ref 0.0–0.2)

## 2019-02-04 LAB — COMPREHENSIVE METABOLIC PANEL
ALT: 22 U/L (ref 0–44)
AST: 25 U/L (ref 15–41)
Albumin: 2.4 g/dL — ABNORMAL LOW (ref 3.5–5.0)
Alkaline Phosphatase: 77 U/L (ref 38–126)
Anion gap: 16 — ABNORMAL HIGH (ref 5–15)
BUN: 26 mg/dL — ABNORMAL HIGH (ref 8–23)
CO2: 27 mmol/L (ref 22–32)
Calcium: 8.6 mg/dL — ABNORMAL LOW (ref 8.9–10.3)
Chloride: 93 mmol/L — ABNORMAL LOW (ref 98–111)
Creatinine, Ser: 0.87 mg/dL (ref 0.44–1.00)
GFR calc Af Amer: >60 mL/min (ref >60)
GFR calc non Af Amer: >60 mL/min (ref >60)
Glucose, Bld: 430 mg/dL — ABNORMAL HIGH (ref 70–99)
Potassium: 4.5 mmol/L (ref 3.5–5.1)
Sodium: 136 mmol/L (ref 135–145)
Total Bilirubin: 0.7 mg/dL (ref 0.3–1.2)
Total Protein: 7.5 g/dL (ref 6.5–8.1)

## 2019-02-04 LAB — MAGNESIUM: Magnesium: 2 mg/dL (ref 1.7–2.4)

## 2019-02-04 LAB — URINE CULTURE: Culture: >=100000 — AB

## 2019-02-04 LAB — PHOSPHORUS: Phosphorus: 2.5 mg/dL (ref 2.5–4.6)

## 2019-02-04 MED ORDER — IPRATROPIUM-ALBUTEROL 20-100 MCG/ACT IN AERS
1.0000 | INHALATION_SPRAY | Freq: Two times a day (BID) | RESPIRATORY_TRACT | Status: DC
  Administered 2019-02-05 – 2019-02-10 (×11): 1 via RESPIRATORY_TRACT

## 2019-02-04 MED ORDER — POTASSIUM CHLORIDE CRYS ER 20 MEQ PO TBCR
40.0000 meq | EXTENDED_RELEASE_TABLET | Freq: Three times a day (TID) | ORAL | Status: AC
  Administered 2019-02-04: 18:00:00 40 meq via ORAL
  Filled 2019-02-04 (×2): qty 2

## 2019-02-04 MED ORDER — FUROSEMIDE 10 MG/ML IJ SOLN
40.0000 mg | Freq: Four times a day (QID) | INTRAMUSCULAR | Status: AC
  Administered 2019-02-04 (×2): 40 mg via INTRAVENOUS
  Filled 2019-02-04 (×2): qty 4

## 2019-02-04 MED ORDER — INSULIN GLARGINE 100 UNIT/ML ~~LOC~~ SOLN
20.0000 [IU] | Freq: Every day | SUBCUTANEOUS | Status: DC
  Administered 2019-02-04 – 2019-02-05 (×2): 20 [IU] via SUBCUTANEOUS
  Filled 2019-02-04 (×3): qty 0.2

## 2019-02-04 MED ORDER — INFLUENZA VAC SPLIT QUAD 0.5 ML IM SUSY
0.5000 mL | PREFILLED_SYRINGE | INTRAMUSCULAR | Status: AC | PRN
  Administered 2019-02-10: 0.5 mL via INTRAMUSCULAR
  Filled 2019-02-04 (×2): qty 0.5

## 2019-02-04 MED ORDER — SODIUM CHLORIDE 0.9% FLUSH
10.0000 mL | Freq: Two times a day (BID) | INTRAVENOUS | Status: DC
  Administered 2019-02-04: 21:00:00 20 mL
  Administered 2019-02-05 – 2019-02-10 (×11): 10 mL

## 2019-02-04 MED ORDER — CHLORHEXIDINE GLUCONATE CLOTH 2 % EX PADS
6.0000 | MEDICATED_PAD | Freq: Every day | CUTANEOUS | Status: DC
  Administered 2019-02-04 – 2019-02-09 (×6): 6 via TOPICAL

## 2019-02-04 MED ORDER — DOCUSATE SODIUM 100 MG PO CAPS
100.0000 mg | ORAL_CAPSULE | Freq: Two times a day (BID) | ORAL | Status: DC | PRN
  Administered 2019-02-07: 09:00:00 100 mg via ORAL
  Filled 2019-02-04 (×2): qty 1

## 2019-02-04 MED ORDER — METOLAZONE 10 MG PO TABS
10.0000 mg | ORAL_TABLET | Freq: Once | ORAL | Status: AC
  Administered 2019-02-04: 11:00:00 10 mg via ORAL
  Filled 2019-02-04: qty 1

## 2019-02-04 MED ORDER — SODIUM CHLORIDE 0.9% FLUSH: 10.0000 mL | INTRAVENOUS | Status: DC | PRN

## 2019-02-04 NOTE — Telephone Encounter (Signed)
Pt admitted to [facility] at this time

## 2019-02-04 NOTE — Plan of Care (Signed)

## 2019-02-04 NOTE — Progress Notes (Signed)
Peripherally Inserted Central Catheter/Midline Placement  The IV Nurse has discussed with the patient and/or persons authorized to consent for the patient, the purpose of this procedure and the potential benefits and risks involved with this procedure.  The benefits include less needle sticks, lab draws from the catheter, and the patient may be discharged home with the catheter. Risks include, but not limited to, infection, bleeding, blood clot (thrombus formation), and puncture of an artery; nerve damage and irregular heartbeat and possibility to perform a PICC exchange if needed/ordered by physician.  Alternatives to this procedure were also discussed.  Bard Power PICC patient education guide, fact sheet on infection prevention and patient information card has been provided to patient /or left at bedside.    PICC/Midline Placement Documentation  PICC Single Lumen 35/46/56 PICC Right Basilic 41 cm 0 cm (Active)  Indication for Insertion or Continuance of Line Poor Vasculature-patient has had multiple peripheral attempts or PIVs lasting less than 24 hours 02/04/19 1603  Exposed Catheter (cm) 0 cm 02/04/19 1603  Site Assessment Clean;Dry;Intact 02/04/19 1603  Line Status Flushed;No blood return 02/04/19 1603  Dressing Type Transparent 02/04/19 1603  Dressing Status Clean;Intact;Dry;Antimicrobial disc in place 02/04/19 1603  Dressing Intervention New dressing 02/04/19 1603  Dressing Change Due 02/11/19 02/04/19 1603       Christella Noa Albarece 02/04/2019, 4:04 PM

## 2019-02-04 NOTE — Progress Notes (Addendum)
Lab only able to get CBC this am after multiple sticks. Patient has 1 working IV and unable to get blood return from new IV. Provider notified with request for PICC or midline

## 2019-02-04 NOTE — Progress Notes (Signed)
PROGRESS NOTE    Amanda Wiley  YBF:[id number] DOB: Nov 22, 1955 DOA: 02/03/2019 PCP: Einar Pheasant, MD   Brief Narrative:  63 year old WF PMHx COPD, DM 2, HTN, HLD, hepatomegaly, and obesity   who was originally admitted to [facility] 10/5 > 10/12 for COVID PNA w/ prolonged hypoxic resp failure. She was d/c home newly on O2 at 5LPM. Since returning home, she has not thrived. She remains stable when at rest, but has had signif difficulty w/ dyspnea w/ even the slightest of exertion, not markedly unlike she was experiencing at the time of her d/c. Over the few days prior to her re-admit she began to experience worsening of her DOE. She consulted w/ her PCP, who advised her to seek attention in the [facility] ED.   At the [facility] ED she underwent a CTa of the chest which did not reveal PE, but was remarkable for considerable persisiting B parenchymal change. Given the extent of her dyspnea, and her saturations in the 80s, she was admitted to the [facility] ICU.   Based upon the timing of her prior COVID testing and active sx, it was discussed w/ the team at [facility] that she was no longer an infectious risk in regard to [place] (as defined by ID). Nonetheless, the staff at [facility] felt most comfortable keeping her on isolation. Due to this fact it was felt to be in her best interest to transfer to [facility] to facilitate effective care.    Subjective: 10/28 A/O x4, negative CP, negative abdominal pain.  Positive SOB   Assessment & Plan:   Active Problems:   Obesity, diabetes, and hypertension syndrome (HCC)   Essential (primary) hypertension   Acute respiratory failure due to COVID-19 [facility])   Acute respiratory disease due to COVID-19 virus   Acute respiratory failure with hypoxia (HCC)   COPD (chronic obstructive pulmonary disease) (HCC)   Diabetes mellitus type 2, uncontrolled, with complications (HCC)   Hepatic steatosis   Respiratory failure with hypoxia recurrent -Combivent QID -Dulera 200-5- BID -Solu-Medrol  40 mg BID -Remdesivir, convalescent plasma negative role at this point her treatment.   COVID-19 Labs  Recent Labs    02/03/19 0104  CRP 16.4*    Lab Results  Component Value Date   SARSCOV2NAA POSITIVE (A) 02/02/2019   SARSCOV2NAA Detected (A) 01/08/2019  -Per patient did have some fluid retention and had some relief with diuresis. -Echocardiogram pending, will determine if cardiac component -Lasix 40 mg x 3 doses -Strict in and out -Daily weight -Albuterol   COPD -See acute respiratory failure with hypoxia  Diabetes type 2 uncontrolled -10/6 Hemoglobin A1c= 7.3 -10/28 increase Lantus 20 units daily -Resistant SSI  Essential HTN -Currently controlled without meds monitor closely   Hepatic steatosis/hepatomegaly -Chronic issue  Polycythemia vera -Hemoglobin stable in normal range at this time  Morbid obesity -Body mass index 35.55 kg/m  Polycythemia vera Hgb stable in a normal range at this time    Hypokalemia -Potassium goal> 4  Hypomagnesmia -Magnesium goal> 2    DVT prophylaxis: Lovenox Code Status: DNR Family Communication:  Disposition Plan: TBD   Consultants:  PCCM  Procedures/Significant Events:  10/1 first COVID+ test  10/5 admit to [facility] w/ COVID pneumonia  10/12 D/C from [place] on home 5L O2     I have personally reviewed and interpreted all radiology studies and my findings are as above.  VENTILATOR SETTINGS: HFNC Flow rate; 20 L/min FiO2 90%    Cultures   Antimicrobials: Anti-infectives (From admission, onward)  None       Devices    LINES / TUBES:      Continuous Infusions:   Objective: Vitals:   02/04/19 1150 02/04/19 1200 02/04/19 1609 02/04/19 1616  BP:  (!) 163/72  117/78  Pulse: 93 97 (!) 104 (!) 105  Resp: (!) 30 (!) 27 (!) 29 (!) 22  Temp:  97.7 F (36.5 C)  97.7 F (36.5 C)  TempSrc:  Oral  Oral  SpO2: 94% 96% 91% 91%  Weight:      Height:        Intake/Output Summary (Last 24 hours)  at 02/04/2019 1701 Last data filed at 02/04/2019 1253 Gross per 24 hour  Intake 840 ml  Output 4000 ml  Net -3160 ml   Filed Weights   02/03/19 1546 02/04/19 0500  Weight: 95.2 kg 94.2 kg    Examination:  General: A/O x4, positive acute respiratory distress Eyes: negative scleral hemorrhage, negative anisocoria, negative icterus ENT: Negative Runny nose, negative gingival bleeding, Neck:  Negative scars, masses, torticollis, lymphadenopathy, JVD Lungs: Tachypneic diffuse decreased breath sounds, without wheezes or crackles Cardiovascular: Regular rate and rhythm without murmur gallop or rub normal S1 and S2 Abdomen: Morbidly obese negative abdominal pain, nondistended, positive soft, bowel sounds, no rebound, no ascites, no appreciable mass Extremities: No significant cyanosis, clubbing, or edema bilateral lower extremities Skin: Negative rashes, lesions, ulcers Psychiatric:  Negative depression, negative anxiety, negative fatigue, negative mania  Central nervous system:  Cranial nerves II through XII intact, tongue/uvula midline, all extremities muscle strength 5/5, sensation intact throughout,negative dysarthria, negative expressive aphasia, negative receptive aphasia.  .     Data Reviewed: Care during the described time interval was provided by me .  I have reviewed this patient's available data, including medical history, events of note, physical examination, and all test results as part of my evaluation.   CBC: Recent Labs  Lab 02/02/19 1246 02/03/19 0404 02/04/19 0630  WBC 9.5 10.0 7.9  NEUTROABS 7.4  --  6.2  HGB 15.2* 13.1 14.1  HCT 49.5* 42.3 45.8  MCV 91.2 91.0 91.8  PLT 279 296 025   Basic Metabolic Panel: Recent Labs  Lab 02/02/19 1246 02/03/19 0404  NA 143 143  K 3.9 3.4*  CL 100 102  CO2 28 29  GLUCOSE 260* 167*  BUN 10 17  CREATININE 0.64 0.65  CALCIUM 8.6* 8.0*  MG 2.2 1.9   GFR: Estimated Creatinine Clearance: 81.2 mL/min (by C-G formula  based on SCr of 0.65 mg/dL). Liver Function Tests: Recent Labs  Lab 02/02/19 1246  AST 22  ALT 18  ALKPHOS 90  BILITOT 0.7  PROT 8.0  ALBUMIN 2.5*   No results for input(s): LIPASE, AMYLASE in the last 168 hours. No results for input(s): AMMONIA in the last 168 hours. Coagulation Profile: Recent Labs  Lab 02/02/19 1246  INR 1.1   Cardiac Enzymes: No results for input(s): CKTOTAL, CKMB, CKMBINDEX, TROPONINI in the last 168 hours. BNP (last 3 results) No results for input(s): PROBNP in the last 8760 hours. HbA1C: No results for input(s): HGBA1C in the last 72 hours. CBG: Recent Labs  Lab 02/03/19 1237 02/03/19 1758 02/03/19 2132 02/04/19 0802 02/04/19 1128  GLUCAP 223* 322* 342* 256* 340*   Lipid Profile: No results for input(s): CHOL, HDL, LDLCALC, TRIG, CHOLHDL, LDLDIRECT in the last 72 hours. Thyroid Function Tests: No results for input(s): TSH, T4TOTAL, FREET4, T3FREE, THYROIDAB in the last 72 hours. Anemia Panel: No results for input(s): VITAMINB12,  FOLATE, FERRITIN, TIBC, IRON, RETICCTPCT in the last 72 hours. Urine analysis:    Component Value Date/Time   COLORURINE STRAW (A) 02/02/2019 1343   APPEARANCEUR CLEAR (A) 02/02/2019 1343   LABSPEC 1.013 02/02/2019 1343   PHURINE 5.0 02/02/2019 1343   GLUCOSEU >=500 (A) 02/02/2019 1343   GLUCOSEU 100 (A) 07/16/2013 1446   HGBUR NEGATIVE 02/02/2019 1343   BILIRUBINUR NEGATIVE 02/02/2019 1343   BILIRUBINUR neg 07/06/2015 1150   KETONESUR NEGATIVE 02/02/2019 1343   PROTEINUR NEGATIVE 02/02/2019 1343   UROBILINOGEN 0.2 07/06/2015 1150   UROBILINOGEN 0.2 07/16/2013 1446   NITRITE NEGATIVE 02/02/2019 1343   LEUKOCYTESUR NEGATIVE 02/02/2019 1343   Sepsis Labs: @LABRCNTIP (procalcitonin:4,lacticidven:4)  ) Recent Results (from the past 240 hour(s))  Blood culture (routine x 2)     Status: None (Preliminary result)   Collection Time: 02/02/19 12:46 PM   Specimen: BLOOD  Result Value Ref Range Status    Specimen Description BLOOD LEFT ANTECUBITAL  Final   Special Requests   Final    BOTTLES DRAWN AEROBIC AND ANAEROBIC Blood Culture adequate volume   Culture   Final    NO GROWTH 2 DAYS Performed at [facility], [address], [place], [place] [address]    Report Status PENDING  Incomplete  SARS Coronavirus 2 by RT PCR (hospital order, performed in [place] hospital lab) Nasopharyngeal Nasopharyngeal Swab     Status: Abnormal   Collection Time: 02/02/19 12:47 PM   Specimen: Nasopharyngeal Swab  Result Value Ref Range Status   SARS Coronavirus 2 POSITIVE (A) NEGATIVE Final    Comment: RESULT CALLED TO, READ BACK BY AND VERIFIED WITH:  AMBER PAYNE AT 0071, 02/02/2019 SDR (NOTE) If result is NEGATIVE SARS-CoV-2 target nucleic acids are NOT DETECTED. The SARS-CoV-2 RNA is generally detectable in upper and lower  respiratory specimens during the acute phase of infection. The lowest  concentration of SARS-CoV-2 viral copies this assay can detect is 250  copies / mL. A negative result does not preclude SARS-CoV-2 infection  and should not be used as the sole basis for treatment or other  patient management decisions.  A negative result may occur with  improper specimen collection / handling, submission of specimen other  than nasopharyngeal swab, presence of viral mutation(s) within the  areas targeted by this assay, and inadequate number of viral copies  (<250 copies / mL). A negative result must be combined with clinical  observations, patient history, and epidemiological information. If result is POSITIVE SARS-CoV-2 target nucleic acids are DETECTED.  The SARS-CoV-2 RNA is generally detectable in upper and lower  respiratory specimens during the acute phase of infection.  Positive  results are indicative of active infection with SARS-CoV-2.  Clinical  correlation with patient history and other diagnostic information is  necessary to determine patient infection status.   Positive results do  not rule out bacterial infection or co-infection with other viruses. If result is PRESUMPTIVE POSTIVE SARS-CoV-2 nucleic acids MAY BE PRESENT.   A presumptive positive result was obtained on the submitted specimen  and confirmed on repeat testing.  While 2019 novel coronavirus  (SARS-CoV-2) nucleic acids may be present in the submitted sample  additional confirmatory testing may be necessary for epidemiological  and / or clinical management purposes  to differentiate between  SARS-CoV-2 and other Sarbecovirus currently known to infect humans.  If clinically indicated additional testing with an alternate test  methodology [phone]) i s advised. The SARS-CoV-2 RNA is generally  detectable in upper  and lower respiratory specimens during the acute  phase of infection. The expected result is Negative. Fact Sheet for Patients:  StrictlyIdeas.no Fact Sheet for Healthcare Providers: BankingDealers.co.za This test is not yet approved or cleared by the [place] FDA and has been authorized for detection and/or diagnosis of SARS-CoV-2 by FDA under an Emergency Use Authorization (EUA).  This EUA will remain in effect (meaning this test can be used) for the duration of the COVID-19 declaration under Section 564(b)(1) of the Act, 21 U.S.C. section 360bbb-3(b)(1), unless the authorization is terminated or revoked sooner. Performed at [facility], [address] [place], [place] [address]   Blood culture (routine x 2)     Status: None (Preliminary result)   Collection Time: 02/02/19  1:04 PM   Specimen: BLOOD  Result Value Ref Range Status   Specimen Description BLOOD RIGHT [facility]  Final   Special Requests   Final    BOTTLES DRAWN AEROBIC AND ANAEROBIC Blood Culture adequate volume   Culture   Final    NO GROWTH 2 DAYS Performed at [facility], [address] [place], [place] [address]    Report Status  PENDING  Incomplete  Urine culture     Status: Abnormal   Collection Time: 02/02/19  1:43 PM   Specimen: Urine, Clean Catch  Result Value Ref Range Status   Specimen Description   Final    URINE, CLEAN CATCH Performed at [facility], [address], [place], [place] [address]    Special Requests   Final    NONE Performed at [facility], [address], [place], [place] [address]    Culture >=100,000 COLONIES/mL KLEBSIELLA PNEUMONIAE (A)  Final   Report Status 02/04/2019 FINAL  Final   Organism ID, Bacteria KLEBSIELLA PNEUMONIAE (A)  Final      Susceptibility   Klebsiella pneumoniae - MIC*    AMPICILLIN RESISTANT Resistant     CEFAZOLIN <=4 SENSITIVE Sensitive     CEFTRIAXONE <=1 SENSITIVE Sensitive     CIPROFLOXACIN <=0.25 SENSITIVE Sensitive     GENTAMICIN <=1 SENSITIVE Sensitive     IMIPENEM <=0.25 SENSITIVE Sensitive     NITROFURANTOIN 128 RESISTANT Resistant     TRIMETH/SULFA <=20 SENSITIVE Sensitive     AMPICILLIN/SULBACTAM 4 SENSITIVE Sensitive     PIP/TAZO <=4 SENSITIVE Sensitive     Extended ESBL NEGATIVE Sensitive     * >=100,000 COLONIES/mL KLEBSIELLA PNEUMONIAE  MRSA PCR Screening     Status: None   Collection Time: 02/03/19 12:24 AM   Specimen: Nasal Mucosa; Nasopharyngeal  Result Value Ref Range Status   MRSA by PCR NEGATIVE NEGATIVE Final    Comment:        The GeneXpert MRSA Assay (FDA approved for NASAL specimens only), is one component of a comprehensive MRSA colonization surveillance program. It is not intended to diagnose MRSA infection nor to guide or monitor treatment for MRSA infections. Performed at [facility], [address] [place], [place] [address]          Radiology Studies: Ct Angio Chest Pe W And/or Wo Contrast  Result Date: 02/02/2019 CLINICAL DATA:  Complex chest pain EXAM: CT ANGIOGRAPHY CHEST WITH CONTRAST TECHNIQUE: Multidetector CT imaging of the chest was performed using the standard  protocol during bolus administration of intravenous contrast. Multiplanar CT image reconstructions and MIPs were obtained to evaluate the vascular anatomy. CONTRAST:  52m OMNIPAQUE IOHEXOL 350 MG/ML SOLN COMPARISON:  None. FINDINGS: Cardiovascular: Contrast injection is sufficient  to demonstrate satisfactory opacification of the pulmonary arteries to the segmental level. There is no pulmonary embolus. The main pulmonary artery is within normal limits for size. There is no CT evidence of acute right heart strain. The aorta demonstrates extensive vascular calcifications. Heart size is normal, without pericardial effusion. Mediastinum/Nodes: --No mediastinal or hilar lymphadenopathy. --No axillary lymphadenopathy. --No supraclavicular lymphadenopathy. --bilateral thyroid nodules are noted. --The esophagus is unremarkable Lungs/Pleura: There are extensive ground-glass bilateral airspace opacities involving all lobes. There are small bilateral pleural effusions, right greater than left. There is no pneumothorax. There is no discrete pulmonary nodule, however evaluation is limited by extensive ground-glass airspace opacities. There is some debris within the trachea. Upper Abdomen: No acute abnormality. Musculoskeletal: No chest wall abnormality. No acute or significant osseous findings. Review of the MIP images confirms the above findings. IMPRESSION: 1. No evidence for pulmonary embolus. 2. Extensive ground-glass airspace opacities involving all lobes of both lungs, with small bilateral pleural effusions, right greater than left. These findings are consistent with the patient's history of viral pneumonia. Pulmonary edema can have a similar appearance. Aortic Atherosclerosis (ICD10-I70.0). Electronically Signed   By: Constance Holster M.D.   On: 02/02/2019 17:43   [place] Ekg Site Rite  Result Date: 02/04/2019 If Site Rite image not attached, placement could not be confirmed due to current cardiac rhythm.        Scheduled Meds: . Chlorhexidine Gluconate Cloth  6 each Topical Daily  . colchicine  0.6 mg Oral BID  . enoxaparin (LOVENOX) injection  40 mg Subcutaneous Q12H  . furosemide  40 mg Intravenous Q6H  . insulin aspart  0-20 Units Subcutaneous TID WC  . insulin aspart  0-5 Units Subcutaneous QHS  . insulin glargine  20 Units Subcutaneous QHS  . [START ON 02/05/2019] Ipratropium-Albuterol  1 puff Inhalation BID  . linagliptin  5 mg Oral Daily  . mouth rinse  15 mL Mouth Rinse BID  . methylPREDNISolone sodium succinate  40 mg Intravenous Q12H  . mometasone-formoterol  2 puff Inhalation BID  . pantoprazole  40 mg Oral Daily  . potassium chloride  40 mEq Oral TID  . sodium chloride flush  10-40 mL Intracatheter Q12H  . ascorbic acid  1,000 mg Oral Daily  . zinc sulfate  220 mg Oral Daily   Continuous Infusions:   LOS: 1 day   The patient is critically ill with multiple organ systems failure and requires high complexity decision making for assessment and support, frequent evaluation and titration of therapies, application of advanced monitoring technologies and extensive interpretation of multiple databases. Critical Care Time devoted to patient care services described in this note  Time spent: 40 minutes     WOODS, Geraldo Docker, MD [facility] Pager [phone]  If 7PM-7AM, please contact night-coverage [email or web address] Password [email or web address] 02/04/2019, 5:01 PM

## 2019-02-04 NOTE — Plan of Care (Signed)
Patient complains of BP cuff being painful and takes it off despite being informed of the need for frequent monitoring. Still wants it off and will attempt to get vs with every assessment.

## 2019-02-04 NOTE — Progress Notes (Signed)
NAME:  JINNIFER MONTEJANO, MRN:  [id number], DOB:  30-Jun-1955, LOS: 1 ADMISSION DATE:  02/03/2019, CONSULTATION DATE:  02/03/19 REFERRING MD:  TRH Thereasa Solo, CHIEF COMPLAINT:  Acute hypoxemic respiratory failure with COVID-19   Brief History   63 year old female with history of COPD, DM, HTN and hepatomegally who was admitted on 10/5 to [facility] discharged on the 12 for COVID pneumonia.  Patient was discharged home on home O2 but returns back to [facility] for worsening respiratory status and hypoxemia.  Patient was retested and naturally remained positive and recommendations were made to d/c isolation per [facility] policy but staff felt uncomfortable with that and the patient was sent to [facility] for further management.  PCCM was consulted to assist with hypoxemic failure.  History of present illness   63 year old female with history of COPD, DM, HTN and hepatomegally who was admitted on 10/5 to [facility] discharged on the 12 for COVID pneumonia.  Patient was discharged home on home O2 but returns back to [facility] for worsening respiratory status and hypoxemia.  Patient was retested and naturally remained positive and recommendations were made to d/c isolation per [facility] policy but staff felt uncomfortable with that and the patient was sent to [facility] for further management.  PCCM was consulted to assist with hypoxemic failure.  Patient reports DOE, PND and severe fatigue  Past Medical History  HTN DM HLD Hepatomegaly  Morbid obesity COVID COPD  Significant Hospital Events   N/A  Consults:  PCCM 10/27  Procedures:  N/A  Significant Diagnostic Tests:  Chest CT with contrast on 10/27 with diffuse interstitial changes, right sided pleural effusion and no PE that I reviewed myself  Micro Data:  None  Antimicrobials:  None   Interim history/subjective:  SOB improved but continues to be fatigued  Objective   Blood pressure (!) 154/63, pulse 98, temperature 98.2 F (36.8 C), temperature source Oral, resp. rate (!)  28, height 5' 5.35" (1.66 m), weight 94.2 kg, SpO2 90 %.    FiO2 (%):  [90 %-100 %] 90 %   Intake/Output Summary (Last 24 hours) at 02/04/2019 0727 Last data filed at 02/04/2019 0600 Gross per 24 hour  Intake 600 ml  Output 2000 ml  Net -1400 ml   Filed Weights   02/03/19 1546 02/04/19 0500  Weight: 95.2 kg 94.2 kg    Examination: General: Fatigued appearing female, NAD HENT: [place]/AT, PERRL, EOM-I and MMM Lungs: Coarse BS diffusely Cardiovascular: RRR, Nl S1/S2 and -M/R/G Abdomen: Soft, NT, ND and +BS Extremities: 1+ edema and -tenderness Neuro: Alert and interactive moving all ext to command Skin: Intact  I reviewed chest CT myself, pleural effusion and diffuse infiltrate noted  Resolved Hospital Problem list   N/A  Assessment & Plan:  63 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  Recommendations: Hold off reculture for now Hold off other covid specific treatment for now short of steroids Will not order D-dimer, ESR, CRP...etc. Titrate O2 for sat of 85-92% No need for further ABGs Discussed code status, patient wishes to be DNR, will alter code status Strict I/O Lasix 40 mg IV q6 x3 doses Zaroxolyn 10 mg PO x1 dose If K from this AM after placement of PICC or medline is low will replace K as well but in the meantime will give 2 doses of lasix  Labs   CBC: Recent Labs  Lab 02/02/19 1246 02/03/19 0404  WBC 9.5 10.0  NEUTROABS 7.4  --   HGB 15.2* 13.1  HCT 49.5* 42.3  MCV 91.2 91.0  PLT 279 290    Basic Metabolic Panel: Recent Labs  Lab 02/02/19 1246 02/03/19 0404  NA 143 143  K 3.9 3.4*  CL 100 102  CO2 28 29  GLUCOSE 260* 167*  BUN 10 17  CREATININE 0.64 0.65  CALCIUM 8.6* 8.0*  MG 2.2 1.9   GFR: Estimated Creatinine Clearance: 81.2 mL/min (by C-G formula based on SCr of 0.65 mg/dL). Recent Labs  Lab 02/02/19 1246 02/02/19 1247 02/02/19 1444  02/03/19 0404  PROCALCITON  --   --  0.14 0.11  WBC 9.5  --   --  10.0  LATICACIDVEN  --  2.0* 1.5  --     Liver Function Tests: Recent Labs  Lab 02/02/19 1246  AST 22  ALT 18  ALKPHOS 90  BILITOT 0.7  PROT 8.0  ALBUMIN 2.5*   No results for input(s): LIPASE, AMYLASE in the last 168 hours. No results for input(s): AMMONIA in the last 168 hours.  ABG    Component Value Date/Time   PHART 7.50 (H) 02/02/2019 1343   PCO2ART 39 02/02/2019 1343   PO2ART 60 (L) 02/02/2019 1343   HCO3 30.4 (H) 02/02/2019 1343   O2SAT 92.8 02/02/2019 1343     Coagulation Profile: Recent Labs  Lab 02/02/19 1246  INR 1.1    Cardiac Enzymes: No results for input(s): CKTOTAL, CKMB, CKMBINDEX, TROPONINI in the last 168 hours.  HbA1C: Hemoglobin A1C  Date/Time Value Ref Range Status  02/20/2018 08:25 AM 6.7 (A) 4.0 - 5.6 % Final   Hgb A1c MFr Bld  Date/Time Value Ref Range Status  01/13/2019 04:22 AM 7.3 (H) 4.8 - 5.6 % Final    Comment:    (NOTE) Pre diabetes:          5.7%-6.4% Diabetes:              >6.4% Glycemic control for   <7.0% adults with diabetes   02/01/2017 08:01 AM 6.9 (H) 4.6 - 6.5 % Final    Comment:    Glycemic Control Guidelines for People with Diabetes:Non Diabetic:  <6%Goal of Therapy: <7%Additional Action Suggested:  >8%     CBG: Recent Labs  Lab 02/03/19 0016 02/03/19 0744 02/03/19 1237 02/03/19 1758 02/03/19 2132  GLUCAP 280* 121* 223* 322* 342*   The patient is critically ill with multiple organ systems failure and requires high complexity decision making for assessment and support, frequent evaluation and titration of therapies, application of advanced monitoring technologies and extensive interpretation of multiple databases.   Critical Care Time devoted to patient care services described in this note is  31  Minutes. This time reflects time of care of this signee Dr Jennet Maduro. This critical care time does not reflect procedure time, or teaching  time or supervisory time of PA/NP/Med student/Med Resident etc but could involve care discussion time.  Rush Farmer, M.D. [facility] Pulmonary/Critical Care Medicine.

## 2019-02-04 NOTE — Progress Notes (Signed)
Inpatient Diabetes Program Recommendations  AACE/ADA: New Consensus Statement on Inpatient Glycemic Control   Target Ranges:  Prepandial:   less than 140 mg/dL      Peak postprandial:   less than 180 mg/dL (1-2 hours)      Critically ill patients:  140 - 180 mg/dL   Results for Amanda Wiley, Amanda Wiley (MRN [id number]) as of 02/04/2019 13:40  Ref. Range 02/03/2019 07:44 02/03/2019 12:37 02/03/2019 17:58 02/03/2019 21:32 02/04/2019 08:02 02/04/2019 11:28  Glucose-Capillary Latest Ref Range: 70 - 99 mg/dL 121 (H) 223 (H) 322 (H) 342 (H) 256 (H) 340 (H)   Review of Glycemic Control  Diabetes history: DM2 Outpatient Diabetes medications: Novolog 0-9 units TID with meals, Novolog 0-5 units QHS Current orders for Inpatient glycemic control: Lantus 20 units QHS, Novolog 0-20 units TID with meals, Novolog 0-5 units QHS, Tradjenta 5 mg daily; Solumedrol 40 mg Q12H  Inpatient Diabetes Program Recommendations:   Insulin-Basal: Noted Lantus was increased from 14 to 20 units QHS today.  Insulin-Meal Coverage: If steroids are continued, please consider ordering Novolog 5 units TID with meals for meal coverage if patient eats at least 50% of meals..  Thanks, Barnie Alderman, RN, MSN, CDE Diabetes Coordinator Inpatient Diabetes Program [phone] (Team Pager from 8am to 5pm)

## 2019-02-05 ENCOUNTER — Inpatient Hospital Stay (HOSPITAL_COMMUNITY)

## 2019-02-05 DIAGNOSIS — J9600 Acute respiratory failure, unspecified whether with hypoxia or hypercapnia: Secondary | ICD-10-CM

## 2019-02-05 DIAGNOSIS — J431 Panlobular emphysema: Secondary | ICD-10-CM

## 2019-02-05 DIAGNOSIS — R0602 Shortness of breath: Secondary | ICD-10-CM

## 2019-02-05 LAB — COMPREHENSIVE METABOLIC PANEL WITH GFR
ALT: 22 U/L (ref 0–44)
AST: 23 U/L (ref 15–41)
Albumin: 2.5 g/dL — ABNORMAL LOW (ref 3.5–5.0)
Alkaline Phosphatase: 74 U/L (ref 38–126)
Anion gap: 13 (ref 5–15)
BUN: 35 mg/dL — ABNORMAL HIGH (ref 8–23)
CO2: 29 mmol/L (ref 22–32)
Calcium: 8.6 mg/dL — ABNORMAL LOW (ref 8.9–10.3)
Chloride: 92 mmol/L — ABNORMAL LOW (ref 98–111)
Creatinine, Ser: 0.94 mg/dL (ref 0.44–1.00)
GFR calc Af Amer: >60 mL/min
GFR calc non Af Amer: >60 mL/min
Glucose, Bld: 447 mg/dL — ABNORMAL HIGH (ref 70–99)
Potassium: 4.7 mmol/L (ref 3.5–5.1)
Sodium: 134 mmol/L — ABNORMAL LOW (ref 135–145)
Total Bilirubin: 0.5 mg/dL (ref 0.3–1.2)
Total Protein: 7.1 g/dL (ref 6.5–8.1)

## 2019-02-05 LAB — ECHOCARDIOGRAM COMPLETE
Height: 65.354 in
Weight: 3322.77 oz

## 2019-02-05 LAB — CBC WITH DIFFERENTIAL/PLATELET
Abs Immature Granulocytes: 0.06 10*3/uL (ref 0.00–0.07)
Basophils Absolute: 0 10*3/uL (ref 0.0–0.1)
Basophils Relative: 0 %
Eosinophils Absolute: 0 10*3/uL (ref 0.0–0.5)
Eosinophils Relative: 0 %
HCT: 44.1 % (ref 36.0–46.0)
Hemoglobin: 13.7 g/dL (ref 12.0–15.0)
Immature Granulocytes: 1 %
Lymphocytes Relative: 7 %
Lymphs Abs: 0.7 10*3/uL (ref 0.7–4.0)
MCH: 28.4 pg (ref 26.0–34.0)
MCHC: 31.1 g/dL (ref 30.0–36.0)
MCV: 91.5 fL (ref 80.0–100.0)
Monocytes Absolute: 0.6 10*3/uL (ref 0.1–1.0)
Monocytes Relative: 6 %
Neutro Abs: 7.9 10*3/uL — ABNORMAL HIGH (ref 1.7–7.7)
Neutrophils Relative %: 86 %
Platelets: 398 10*3/uL (ref 150–400)
RBC: 4.82 MIL/uL (ref 3.87–5.11)
RDW: 15.5 % (ref 11.5–15.5)
WBC: 9.2 10*3/uL (ref 4.0–10.5)
nRBC: 0 % (ref 0.0–0.2)

## 2019-02-05 LAB — GLUCOSE, CAPILLARY
Glucose-Capillary: 413 mg/dL — ABNORMAL HIGH (ref 70–99)
Glucose-Capillary: 406 mg/dL — ABNORMAL HIGH (ref 70–99)
Glucose-Capillary: 372 mg/dL — ABNORMAL HIGH (ref 70–99)
Glucose-Capillary: 337 mg/dL — ABNORMAL HIGH (ref 70–99)
Glucose-Capillary: 420 mg/dL — ABNORMAL HIGH (ref 70–99)

## 2019-02-05 LAB — PHOSPHORUS: Phosphorus: 4.3 mg/dL (ref 2.5–4.6)

## 2019-02-05 LAB — MAGNESIUM: Magnesium: 2.2 mg/dL (ref 1.7–2.4)

## 2019-02-05 LAB — C-REACTIVE PROTEIN: CRP: 5.9 mg/dL — ABNORMAL HIGH (ref <1.0)

## 2019-02-05 LAB — D-DIMER, QUANTITATIVE: D-Dimer, Quant: 2.09 ug/mL-FEU — ABNORMAL HIGH (ref 0.00–0.50)

## 2019-02-05 MED ORDER — INSULIN GLARGINE 100 UNIT/ML ~~LOC~~ SOLN
20.0000 [IU] | Freq: Every morning | SUBCUTANEOUS | Status: DC
  Administered 2019-02-05 – 2019-02-07 (×3): 20 [IU] via SUBCUTANEOUS
  Filled 2019-02-05 (×3): qty 0.2

## 2019-02-05 MED ORDER — FUROSEMIDE 10 MG/ML IJ SOLN
40.0000 mg | Freq: Three times a day (TID) | INTRAMUSCULAR | Status: AC
  Administered 2019-02-05 (×2): 40 mg via INTRAVENOUS
  Filled 2019-02-05 (×2): qty 4

## 2019-02-05 MED ORDER — METOLAZONE 5 MG PO TABS
5.0000 mg | ORAL_TABLET | Freq: Every day | ORAL | Status: AC
  Administered 2019-02-05: 13:00:00 5 mg via ORAL
  Filled 2019-02-05: qty 1

## 2019-02-05 MED ORDER — INSULIN ASPART 100 UNIT/ML ~~LOC~~ SOLN
10.0000 [IU] | Freq: Once | SUBCUTANEOUS | Status: AC
  Administered 2019-02-05: 10 [IU] via SUBCUTANEOUS

## 2019-02-05 MED ORDER — INSULIN ASPART 100 UNIT/ML ~~LOC~~ SOLN
10.0000 [IU] | Freq: Three times a day (TID) | SUBCUTANEOUS | Status: DC
  Administered 2019-02-05 – 2019-02-06 (×3): 10 [IU] via SUBCUTANEOUS

## 2019-02-05 NOTE — Progress Notes (Signed)
Inpatient Diabetes Program Recommendations  AACE/ADA: New Consensus Statement on Inpatient Glycemic Control   Target Ranges:  Prepandial:   less than 140 mg/dL      Peak postprandial:   less than 180 mg/dL (1-2 hours)      Critically ill patients:  140 - 180 mg/dL   Results for LYSETTE, LINDENBAUM (MRN [id number]) as of 02/05/2019 07:50  Ref. Range 02/04/2019 08:02 02/04/2019 11:28 02/04/2019 17:24 02/04/2019 22:00 02/05/2019 07:27  Glucose-Capillary Latest Ref Range: 70 - 99 mg/dL 256 (H) 340 (H) 381 (H) 292 (H) 413 (H)   Review of Glycemic Control  Diabetes history: DM2 Outpatient Diabetes medications: Novolog 0-9 units TID with meals, Novolog 0-5 units QHS Current orders for Inpatient glycemic control: Lantus 20 units QAM, Lantus 20 units QHS, Novolog 0-20 units TID with meals, Novolog 0-5 units QHS, Tradjenta 5 mg daily; Solumedrol 40 mg Q12H  Inpatient Diabetes Program Recommendations:   Insulin-Basal: Noted Lantus 20 units QAM added today.  Insulin-Meal Coverage: If steroids are continued, please consider ordering Novolog 5 units TID with meals for meal coverage if patient eats at least 50% of meals..  Thanks, Barnie Alderman, RN, MSN, CDE Diabetes Coordinator Inpatient Diabetes Program [phone] (Team Pager from 8am to 5pm)

## 2019-02-05 NOTE — Progress Notes (Signed)
  Echocardiogram 2D Echocardiogram has been performed.  Amanda Wiley 02/05/2019, 6:53 PM

## 2019-02-05 NOTE — Progress Notes (Addendum)
0730: Assumed care of Pt from night RN.   0745: Pt BG >400. MD made aware and new orders obtained. Assessment as documented, HR ST, RR mid to high 20's, desats quickly with exertion, BP's stable. Denies pain. PICC flushed without complication. Purewick in place. Pericare and repositoned complete. Bed low and instructed to call for assistance.  0900: Pt assisted out of bed to [facility] then chair with desats and tachycardia up to 140's. Coughing up thick secretions. Independent use of IS. Will monitor  1045: Pt bathed and assisted back to bed. VS recovered fairly well after resting. Instructed to call for assistance  1800: Echo complete. Purewick in place working well. Pt resting comfortably in bed. Will monitor

## 2019-02-05 NOTE — Progress Notes (Signed)
Pt taken off Heated HFNC and placed on [place] HFNC at 15L. Pt denies SOB, no increased WOB, VS within normal limits. RT will continue to monitor

## 2019-02-05 NOTE — Progress Notes (Signed)
NAME:  Amanda Wiley, MRN:  [id number], DOB:  [birth year], LOS: 2 ADMISSION DATE:  02/03/2019, CONSULTATION DATE:  02/03/19 REFERRING MD:  TRH Thereasa Solo, CHIEF COMPLAINT:  Acute hypoxemic respiratory failure with COVID-19   Brief History   63 year old female with history of COPD, DM, HTN and hepatomegally who was admitted on 10/5 to [facility] discharged on the 12 for COVID pneumonia.  Patient was discharged home on home O2 but returns back to [facility] for worsening respiratory status and hypoxemia.  Patient was retested and naturally remained positive and recommendations were made to d/c isolation per [facility] policy but staff felt uncomfortable with that and the patient was sent to [facility] for further management.  PCCM was consulted to assist with hypoxemic failure.  History of present illness   63 year old female with history of COPD, DM, HTN and hepatomegally who was admitted on 10/5 to [facility] discharged on the 12 for COVID pneumonia.  Patient was discharged home on home O2 but returns back to [facility] for worsening respiratory status and hypoxemia.  Patient was retested and naturally remained positive and recommendations were made to d/c isolation per [facility] policy but staff felt uncomfortable with that and the patient was sent to [facility] for further management.  PCCM was consulted to assist with hypoxemic failure.  Patient reports DOE, PND and severe fatigue  Past Medical History  HTN DM HLD Hepatomegaly  Morbid obesity COVID COPD  Significant Hospital Events   N/A  Consults:  PCCM 10/27  Procedures:  N/A  Significant Diagnostic Tests:  Chest CT with contrast on 10/27 with diffuse interstitial changes, right sided pleural effusion and no PE that I reviewed myself  Micro Data:  None  Antimicrobials:  None   Interim history/subjective:  O2 demand continues to be high  Objective   Blood pressure 136/78, pulse (!) 105, temperature 98.8 F (37.1 C), temperature source Oral, resp. rate (!) 26, height  5' 5.35" (1.66 m), weight 94.2 kg, SpO2 92 %.    FiO2 (%):  [30 %-100 %] 90 %   Intake/Output Summary (Last 24 hours) at 02/05/2019 0737 Last data filed at 02/05/2019 0400 Gross per 24 hour  Intake 1560 ml  Output 5075 ml  Net -3515 ml   Filed Weights   02/03/19 1546 02/04/19 0500  Weight: 95.2 kg 94.2 kg   Examination: General: Chronically ill appearing female, mild respiratory distress HENT: [place]/AT, PERRL, EOM-I and MMM Lungs: Coarse BS diffusely Cardiovascular: RRR, Nl S1/S2 and -M/R/G Abdomen: Soft, NT, ND and +BS Extremities: -edema and -tenderness Neuro: Alert and interactive moving all ext to command Skin: Intact  I reviewed CXR myself, infiltrate noted  Resolved Hospital Problem list   N/A  Assessment & Plan:  63 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  Recommendations: Monit Hold off other covid specific treatment for now short of steroids Titrate O2 for sat of 85-92% No need for further ABGs Discussed code status, patient wishes to be DNR, will alter code status Strict I/O Lasix 40 mg IV q6 x3 doses Zaroxolyn 10 mg PO x1 dose Replace electrolytes as indicated  Labs   CBC: Recent Labs  Lab 02/02/19 1246 02/03/19 0404 02/04/19 0630 02/05/19 0506  WBC 9.5 10.0 7.9 9.2  NEUTROABS 7.4  --  6.2 7.9*  HGB 15.2* 13.1 14.1 13.7  HCT 49.5* 42.3 45.8 44.1  MCV 91.2 91.0 91.8 91.5  PLT 279  296 252 299    Basic Metabolic Panel: Recent Labs  Lab 02/02/19 1246 02/03/19 0404 02/04/19 1823 02/05/19 0506  NA 143 143 136 134*  K 3.9 3.4* 4.5 4.7  CL 100 102 93* 92*  CO2 28 29 27 29   GLUCOSE 260* 167* 430* 447*  BUN 10 17 26* 35*  CREATININE 0.64 0.65 0.87 0.94  CALCIUM 8.6* 8.0* 8.6* 8.6*  MG 2.2 1.9 2.0 2.2  PHOS  --   --  2.5 4.3   GFR: Estimated Creatinine Clearance: 69.1 mL/min (by C-G formula based on SCr of 0.94 mg/dL). Recent Labs  Lab 02/02/19  1246 02/02/19 1247 02/02/19 1444 02/03/19 0404 02/04/19 0630 02/05/19 0506  PROCALCITON  --   --  0.14 0.11  --   --   WBC 9.5  --   --  10.0 7.9 9.2  LATICACIDVEN  --  2.0* 1.5  --   --   --     Liver Function Tests: Recent Labs  Lab 02/02/19 1246 02/04/19 1823 02/05/19 0506  AST 22 25 23   ALT 18 22 22   ALKPHOS 90 77 74  BILITOT 0.7 0.7 0.5  PROT 8.0 7.5 7.1  ALBUMIN 2.5* 2.4* 2.5*   No results for input(s): LIPASE, AMYLASE in the last 168 hours. No results for input(s): AMMONIA in the last 168 hours.  ABG    Component Value Date/Time   PHART 7.50 (H) 02/02/2019 1343   PCO2ART 39 02/02/2019 1343   PO2ART 60 (L) 02/02/2019 1343   HCO3 30.4 (H) 02/02/2019 1343   O2SAT 92.8 02/02/2019 1343     Coagulation Profile: Recent Labs  Lab 02/02/19 1246  INR 1.1    Cardiac Enzymes: No results for input(s): CKTOTAL, CKMB, CKMBINDEX, TROPONINI in the last 168 hours.  HbA1C: Hemoglobin A1C  Date/Time Value Ref Range Status  02/20/2018 08:25 AM 6.7 (A) 4.0 - 5.6 % Final   Hgb A1c MFr Bld  Date/Time Value Ref Range Status  01/13/2019 04:22 AM 7.3 (H) 4.8 - 5.6 % Final    Comment:    (NOTE) Pre diabetes:          5.7%-6.4% Diabetes:              >6.4% Glycemic control for   <7.0% adults with diabetes   02/01/2017 08:01 AM 6.9 (H) 4.6 - 6.5 % Final    Comment:    Glycemic Control Guidelines for People with Diabetes:Non Diabetic:  <6%Goal of Therapy: <7%Additional Action Suggested:  >8%     CBG: Recent Labs  Lab 02/03/19 2132 02/04/19 0802 02/04/19 1128 02/04/19 1724 02/04/19 2200  GLUCAP 342* 256* 340* 381* 292*   The patient is critically ill with multiple organ systems failure and requires high complexity decision making for assessment and support, frequent evaluation and titration of therapies, application of advanced monitoring technologies and extensive interpretation of multiple databases.   Critical Care Time devoted to patient care services  described in this note is  31  Minutes. This time reflects time of care of this signee Dr Jennet Maduro. This critical care time does not reflect procedure time, or teaching time or supervisory time of PA/NP/Med student/Med Resident etc but could involve care discussion time.  Rush Farmer, M.D. [facility] Pulmonary/Critical Care Medicine.

## 2019-02-05 NOTE — Progress Notes (Addendum)
PROGRESS NOTE    Amanda Wiley  FGH:[id number] DOB: [birth year] DOA: 02/03/2019 PCP: Einar Pheasant, MD   Brief Narrative:  63 year old WF PMHx COPD, DM 2, HTN, HLD, hepatomegaly, and obesity   who was originally admitted to [facility] 10/5 > 10/12 for COVID PNA w/ prolonged hypoxic resp failure. She was d/c home newly on O2 at 5LPM. Since returning home, she has not thrived. She remains stable when at rest, but has had signif difficulty w/ dyspnea w/ even the slightest of exertion, not markedly unlike she was experiencing at the time of her d/c. Over the few days prior to her re-admit she began to experience worsening of her DOE. She consulted w/ her PCP, who advised her to seek attention in the [facility] ED.   At the [facility] ED she underwent a CTa of the chest which did not reveal PE, but was remarkable for considerable persisiting B parenchymal change. Given the extent of her dyspnea, and her saturations in the 80s, she was admitted to the [facility] ICU.   Based upon the timing of her prior COVID testing and active sx, it was discussed w/ the team at [facility] that she was no longer an infectious risk in regard to [place] (as defined by ID). Nonetheless, the staff at [facility] felt most comfortable keeping her on isolation. Due to this fact it was felt to be in her best interest to transfer to [facility] to facilitate effective care.    Subjective: 10/29 A/O x4, negative CP, negative abdominal pain, positive S OB but improved   Assessment & Plan:   Active Problems:   Obesity, diabetes, and hypertension syndrome (HCC)   Essential (primary) hypertension   Acute respiratory failure due to COVID-19 [facility])   Acute respiratory disease due to COVID-19 virus   Acute respiratory failure with hypoxia (HCC)   COPD (chronic obstructive pulmonary disease) (HCC)   Diabetes mellitus type 2, uncontrolled, with complications (HCC)   Hepatic steatosis   Respiratory failure with hypoxia recurrent -Combivent QID -Dulera 200-5- BID  -Solu-Medrol 40 mg BID -Remdesivir, convalescent plasma negative role at this point her treatment.   COVID-19 Labs  Recent Labs    02/03/19 0104 02/05/19 0505 02/05/19 0506  DDIMER  --   --  2.09*  CRP 16.4* 5.9*  --     Lab Results  Component Value Date   SARSCOV2NAA POSITIVE (A) 02/02/2019   SARSCOV2NAA Detected (A) 01/08/2019  -Per patient did have some fluid retention and had some relief with diuresis. -Echocardiogram pending, will determine if cardiac component -Lasix 40 mg x 3 doses -Strict in and out -4.0 L -Daily weight Filed Weights   02/03/19 1546 02/04/19 0500  Weight: 95.2 kg 94.2 kg  -Albuterol  -Echocardiogram pending  COPD -See acute respiratory failure with hypoxia  Diabetes type 2 uncontrolled -10/6 Hemoglobin A1c= 7.3 -10/29 increase Lantus 20 units BID -10/29 NovoLog 10 units qac -Resistant SSI  Essential HTN -Currently controlled without meds monitor closely  Hepatic steatosis/hepatomegaly -Chronic issue  Polycythemia vera -Hemoglobin stable in normal range at this time  Morbid obesity -Body mass index 35.55 kg/m  Polycythemia vera -Hgb stable in a normal range at this time   Hypokalemia -Potassium goal> 4  Hypomagnesmia -Magnesium goal> 2    DVT prophylaxis: Lovenox Code Status: DNR Family Communication: 10/29 spoke with Dellis Filbert (husband) counseled on plan of care answered all questions Disposition Plan: TBD   Consultants:  PCCM  Procedures/Significant Events:  10/1 first COVID+ test  10/5 admit to [facility]  w/ COVID pneumonia  10/12 D/C from [place] on home 5L O2     I have personally reviewed and interpreted all radiology studies and my findings are as above.  VENTILATOR SETTINGS: HFNC Flow rate; 15 L/min FiO2 90%    Cultures   Antimicrobials: Anti-infectives (From admission, onward)   None      Devices    LINES / TUBES:      Continuous Infusions:   Objective: Vitals:   02/05/19 0418 02/05/19  0500 02/05/19 0600 02/05/19 0730  BP: 123/74   136/78  Pulse: 98 (!) 107 (!) 105 (!) 105  Resp: (!) 30 (!) 30 (!) 26 (!) 26  Temp: 98.4 F (36.9 C)   98.8 F (37.1 C)  TempSrc: Axillary   Oral  SpO2: 96% 96% 94% 92%  Weight:      Height:        Intake/Output Summary (Last 24 hours) at 02/05/2019 0750 Last data filed at 02/05/2019 0400 Gross per 24 hour  Intake 1320 ml  Output 4875 ml  Net -3555 ml   Filed Weights   02/03/19 1546 02/04/19 0500  Weight: 95.2 kg 94.2 kg   Physical Exam:  General: A/O x4, positive acute respiratory distress Eyes: negative scleral hemorrhage, negative anisocoria, negative icterus ENT: Negative Runny nose, negative gingival bleeding, Neck:  Negative scars, masses, torticollis, lymphadenopathy, JVD Lungs: Tachypneic clear to auscultation bilaterally without wheezes or crackles Cardiovascular: Tachycardic without murmur gallop or rub normal S1 and S2 Abdomen: MORBIDLY OBESE, negative abdominal pain, nondistended, positive soft, bowel sounds, no rebound, no ascites, no appreciable mass Extremities: No significant cyanosis, clubbing, or edema bilateral lower extremities Skin: Negative rashes, lesions, ulcers Psychiatric:  Negative depression, negative anxiety, negative fatigue, negative mania  Central nervous system:  Cranial nerves II through XII intact, tongue/uvula midline, all extremities muscle strength 5/5, sensation intact throughout,negative dysarthria, negative expressive aphasia, negative receptive aphasia. .     Data Reviewed: Care during the described time interval was provided by me .  I have reviewed this patient's available data, including medical history, events of note, physical examination, and all test results as part of my evaluation.   CBC: Recent Labs  Lab 02/02/19 1246 02/03/19 0404 02/04/19 0630 02/05/19 0506  WBC 9.5 10.0 7.9 9.2  NEUTROABS 7.4  --  6.2 7.9*  HGB 15.2* 13.1 14.1 13.7  HCT 49.5* 42.3 45.8 44.1  MCV  91.2 91.0 91.8 91.5  PLT 279 296 252 970   Basic Metabolic Panel: Recent Labs  Lab 02/02/19 1246 02/03/19 0404 02/04/19 1823 02/05/19 0506  NA 143 143 136 134*  K 3.9 3.4* 4.5 4.7  CL 100 102 93* 92*  CO2 28 29 27 29   GLUCOSE 260* 167* 430* 447*  BUN 10 17 26* 35*  CREATININE 0.64 0.65 0.87 0.94  CALCIUM 8.6* 8.0* 8.6* 8.6*  MG 2.2 1.9 2.0 2.2  PHOS  --   --  2.5 4.3   GFR: Estimated Creatinine Clearance: 69.1 mL/min (by C-G formula based on SCr of 0.94 mg/dL). Liver Function Tests: Recent Labs  Lab 02/02/19 1246 02/04/19 1823 02/05/19 0506  AST 22 25 23   ALT 18 22 22   ALKPHOS 90 77 74  BILITOT 0.7 0.7 0.5  PROT 8.0 7.5 7.1  ALBUMIN 2.5* 2.4* 2.5*   No results for input(s): LIPASE, AMYLASE in the last 168 hours. No results for input(s): AMMONIA in the last 168 hours. Coagulation Profile: Recent Labs  Lab 02/02/19 1246  INR 1.1  Cardiac Enzymes: No results for input(s): CKTOTAL, CKMB, CKMBINDEX, TROPONINI in the last 168 hours. BNP (last 3 results) No results for input(s): PROBNP in the last 8760 hours. HbA1C: No results for input(s): HGBA1C in the last 72 hours. CBG: Recent Labs  Lab 02/04/19 0802 02/04/19 1128 02/04/19 1724 02/04/19 2200 02/05/19 0727  GLUCAP 256* 340* 381* 292* 413*   Lipid Profile: No results for input(s): CHOL, HDL, LDLCALC, TRIG, CHOLHDL, LDLDIRECT in the last 72 hours. Thyroid Function Tests: No results for input(s): TSH, T4TOTAL, FREET4, T3FREE, THYROIDAB in the last 72 hours. Anemia Panel: No results for input(s): VITAMINB12, FOLATE, FERRITIN, TIBC, IRON, RETICCTPCT in the last 72 hours. Urine analysis:    Component Value Date/Time   COLORURINE STRAW (A) 02/02/2019 1343   APPEARANCEUR CLEAR (A) 02/02/2019 1343   LABSPEC 1.013 02/02/2019 1343   PHURINE 5.0 02/02/2019 1343   GLUCOSEU >=500 (A) 02/02/2019 1343   GLUCOSEU 100 (A) 07/16/2013 1446   HGBUR NEGATIVE 02/02/2019 1343   BILIRUBINUR NEGATIVE 02/02/2019 1343    BILIRUBINUR neg 07/06/2015 1150   KETONESUR NEGATIVE 02/02/2019 1343   PROTEINUR NEGATIVE 02/02/2019 1343   UROBILINOGEN 0.2 07/06/2015 1150   UROBILINOGEN 0.2 07/16/2013 1446   NITRITE NEGATIVE 02/02/2019 1343   LEUKOCYTESUR NEGATIVE 02/02/2019 1343   Sepsis Labs: @LABRCNTIP (procalcitonin:4,lacticidven:4)  ) Recent Results (from the past 240 hour(s))  Blood culture (routine x 2)     Status: None (Preliminary result)   Collection Time: 02/02/19 12:46 PM   Specimen: BLOOD  Result Value Ref Range Status   Specimen Description BLOOD LEFT ANTECUBITAL  Final   Special Requests   Final    BOTTLES DRAWN AEROBIC AND ANAEROBIC Blood Culture adequate volume   Culture   Final    NO GROWTH 3 DAYS Performed at [facility], [address] [place], [place] [address]    Report Status PENDING  Incomplete  SARS Coronavirus 2 by RT PCR (hospital order, performed in [place] hospital lab) Nasopharyngeal Nasopharyngeal Swab     Status: Abnormal   Collection Time: 02/02/19 12:47 PM   Specimen: Nasopharyngeal Swab  Result Value Ref Range Status   SARS Coronavirus 2 POSITIVE (A) NEGATIVE Final    Comment: RESULT CALLED TO, READ BACK BY AND VERIFIED WITH:  AMBER PAYNE AT 8144, 02/02/2019 SDR (NOTE) If result is NEGATIVE SARS-CoV-2 target nucleic acids are NOT DETECTED. The SARS-CoV-2 RNA is generally detectable in upper and lower  respiratory specimens during the acute phase of infection. The lowest  concentration of SARS-CoV-2 viral copies this assay can detect is 250  copies / mL. A negative result does not preclude SARS-CoV-2 infection  and should not be used as the sole basis for treatment or other  patient management decisions.  A negative result may occur with  improper specimen collection / handling, submission of specimen other  than nasopharyngeal swab, presence of viral mutation(s) within the  areas targeted by this assay, and inadequate number of viral copies  (<250  copies / mL). A negative result must be combined with clinical  observations, patient history, and epidemiological information. If result is POSITIVE SARS-CoV-2 target nucleic acids are DETECTED.  The SARS-CoV-2 RNA is generally detectable in upper and lower  respiratory specimens during the acute phase of infection.  Positive  results are indicative of active infection with SARS-CoV-2.  Clinical  correlation with patient history and other diagnostic information is  necessary to determine patient infection status.  Positive results do  not rule out bacterial infection  or co-infection with other viruses. If result is PRESUMPTIVE POSTIVE SARS-CoV-2 nucleic acids MAY BE PRESENT.   A presumptive positive result was obtained on the submitted specimen  and confirmed on repeat testing.  While 2019 novel coronavirus  (SARS-CoV-2) nucleic acids may be present in the submitted sample  additional confirmatory testing may be necessary for epidemiological  and / or clinical management purposes  to differentiate between  SARS-CoV-2 and other Sarbecovirus currently known to infect humans.  If clinically indicated additional testing with an alternate test  methodology [phone]) i s advised. The SARS-CoV-2 RNA is generally  detectable in upper and lower respiratory specimens during the acute  phase of infection. The expected result is Negative. Fact Sheet for Patients:  StrictlyIdeas.no Fact Sheet for Healthcare Providers: BankingDealers.co.za This test is not yet approved or cleared by the [place] FDA and has been authorized for detection and/or diagnosis of SARS-CoV-2 by FDA under an Emergency Use Authorization (EUA).  This EUA will remain in effect (meaning this test can be used) for the duration of the COVID-19 declaration under Section 564(b)(1) of the Act, 21 U.S.C. section 360bbb-3(b)(1), unless the authorization is terminated or revoked  sooner. Performed at [facility], [address] [place], [place] [address]   Blood culture (routine x 2)     Status: None (Preliminary result)   Collection Time: 02/02/19  1:04 PM   Specimen: BLOOD  Result Value Ref Range Status   Specimen Description BLOOD RIGHT [facility]  Final   Special Requests   Final    BOTTLES DRAWN AEROBIC AND ANAEROBIC Blood Culture adequate volume   Culture   Final    NO GROWTH 3 DAYS Performed at [facility], [address], [place], [place] [address]    Report Status PENDING  Incomplete  Urine culture     Status: Abnormal   Collection Time: 02/02/19  1:43 PM   Specimen: Urine, Clean Catch  Result Value Ref Range Status   Specimen Description   Final    URINE, CLEAN CATCH Performed at [facility], [address], [place], [place] [address]    Special Requests   Final    NONE Performed at [facility], [address], [place], [place] [address]    Culture >=100,000 COLONIES/mL KLEBSIELLA PNEUMONIAE (A)  Final   Report Status 02/04/2019 FINAL  Final   Organism ID, Bacteria KLEBSIELLA PNEUMONIAE (A)  Final      Susceptibility   Klebsiella pneumoniae - MIC*    AMPICILLIN RESISTANT Resistant     CEFAZOLIN <=4 SENSITIVE Sensitive     CEFTRIAXONE <=1 SENSITIVE Sensitive     CIPROFLOXACIN <=0.25 SENSITIVE Sensitive     GENTAMICIN <=1 SENSITIVE Sensitive     IMIPENEM <=0.25 SENSITIVE Sensitive     NITROFURANTOIN 128 RESISTANT Resistant     TRIMETH/SULFA <=20 SENSITIVE Sensitive     AMPICILLIN/SULBACTAM 4 SENSITIVE Sensitive     PIP/TAZO <=4 SENSITIVE Sensitive     Extended ESBL NEGATIVE Sensitive     * >=100,000 COLONIES/mL KLEBSIELLA PNEUMONIAE  MRSA PCR Screening     Status: None   Collection Time: 02/03/19 12:24 AM   Specimen: Nasal Mucosa; Nasopharyngeal  Result Value Ref Range Status   MRSA by PCR NEGATIVE NEGATIVE Final    Comment:        The GeneXpert MRSA Assay (FDA approved for NASAL specimens  only), is one component of a comprehensive MRSA colonization surveillance program. It is not intended to diagnose MRSA infection  nor to guide or monitor treatment for MRSA infections. Performed at [facility], [address], [place], [place] [address]          Radiology Studies: [place] Ekg Site Rite  Result Date: 02/04/2019 If [facility] image not attached, placement could not be confirmed due to current cardiac rhythm.       Scheduled Meds: . Chlorhexidine Gluconate Cloth  6 each Topical Daily  . colchicine  0.6 mg Oral BID  . enoxaparin (LOVENOX) injection  40 mg Subcutaneous Q12H  . insulin aspart  0-20 Units Subcutaneous TID WC  . insulin aspart  0-5 Units Subcutaneous QHS  . insulin glargine  20 Units Subcutaneous QHS  . insulin glargine  20 Units Subcutaneous q morning - 10a  . Ipratropium-Albuterol  1 puff Inhalation BID  . linagliptin  5 mg Oral Daily  . mouth rinse  15 mL Mouth Rinse BID  . methylPREDNISolone sodium succinate  40 mg Intravenous Q12H  . mometasone-formoterol  2 puff Inhalation BID  . pantoprazole  40 mg Oral Daily  . sodium chloride flush  10-40 mL Intracatheter Q12H  . ascorbic acid  1,000 mg Oral Daily  . zinc sulfate  220 mg Oral Daily   Continuous Infusions:   LOS: 2 days   The patient is critically ill with multiple organ systems failure and requires high complexity decision making for assessment and support, frequent evaluation and titration of therapies, application of advanced monitoring technologies and extensive interpretation of multiple databases. Critical Care Time devoted to patient care services described in this note  Time spent: 40 minutes     WOODS, Geraldo Docker, MD [facility] Pager [phone]  If 7PM-7AM, please contact night-coverage [email or web address] Password [email or web address] 02/05/2019, 7:50 AM

## 2019-02-06 ENCOUNTER — Inpatient Hospital Stay (HOSPITAL_COMMUNITY)

## 2019-02-06 DIAGNOSIS — E1169 Type 2 diabetes mellitus with other specified complication: Secondary | ICD-10-CM

## 2019-02-06 DIAGNOSIS — E1159 Type 2 diabetes mellitus with other circulatory complications: Secondary | ICD-10-CM

## 2019-02-06 DIAGNOSIS — E669 Obesity, unspecified: Secondary | ICD-10-CM

## 2019-02-06 LAB — COMPREHENSIVE METABOLIC PANEL
ALT: 26 U/L (ref 0–44)
AST: 24 U/L (ref 15–41)
Albumin: 2.7 g/dL — ABNORMAL LOW (ref 3.5–5.0)
Alkaline Phosphatase: 69 U/L (ref 38–126)
Anion gap: 15 (ref 5–15)
BUN: 43 mg/dL — ABNORMAL HIGH (ref 8–23)
CO2: 32 mmol/L (ref 22–32)
Calcium: 8.7 mg/dL — ABNORMAL LOW (ref 8.9–10.3)
Chloride: 88 mmol/L — ABNORMAL LOW (ref 98–111)
Creatinine, Ser: 0.75 mg/dL (ref 0.44–1.00)
GFR calc Af Amer: >60 mL/min (ref >60)
GFR calc non Af Amer: >60 mL/min (ref >60)
Glucose, Bld: 324 mg/dL — ABNORMAL HIGH (ref 70–99)
Potassium: 3.7 mmol/L (ref 3.5–5.1)
Sodium: 135 mmol/L (ref 135–145)
Total Bilirubin: 0.8 mg/dL (ref 0.3–1.2)
Total Protein: 7.3 g/dL (ref 6.5–8.1)

## 2019-02-06 LAB — CBC WITH DIFFERENTIAL/PLATELET
Abs Immature Granulocytes: 0.1 10*3/uL — ABNORMAL HIGH (ref 0.00–0.07)
Basophils Absolute: 0 10*3/uL (ref 0.0–0.1)
Basophils Relative: 0 %
Eosinophils Absolute: 0 10*3/uL (ref 0.0–0.5)
Eosinophils Relative: 0 %
HCT: 45.7 % (ref 36.0–46.0)
Hemoglobin: 14.5 g/dL (ref 12.0–15.0)
Immature Granulocytes: 1 %
Lymphocytes Relative: 11 %
Lymphs Abs: 1.2 10*3/uL (ref 0.7–4.0)
MCH: 28.5 pg (ref 26.0–34.0)
MCHC: 31.7 g/dL (ref 30.0–36.0)
MCV: 90 fL (ref 80.0–100.0)
Monocytes Absolute: 1.1 10*3/uL — ABNORMAL HIGH (ref 0.1–1.0)
Monocytes Relative: 10 %
Neutro Abs: 8.3 10*3/uL — ABNORMAL HIGH (ref 1.7–7.7)
Neutrophils Relative %: 78 %
Platelets: 445 10*3/uL — ABNORMAL HIGH (ref 150–400)
RBC: 5.08 MIL/uL (ref 3.87–5.11)
RDW: 14.9 % (ref 11.5–15.5)
WBC: 10.7 10*3/uL — ABNORMAL HIGH (ref 4.0–10.5)
nRBC: 0 % (ref 0.0–0.2)

## 2019-02-06 LAB — D-DIMER, QUANTITATIVE: D-Dimer, Quant: 1.25 ug/mL-FEU — ABNORMAL HIGH (ref 0.00–0.50)

## 2019-02-06 LAB — GLUCOSE, CAPILLARY
Glucose-Capillary: 362 mg/dL — ABNORMAL HIGH (ref 70–99)
Glucose-Capillary: 352 mg/dL — ABNORMAL HIGH (ref 70–99)
Glucose-Capillary: 257 mg/dL — ABNORMAL HIGH (ref 70–99)

## 2019-02-06 LAB — MAGNESIUM: Magnesium: 2.3 mg/dL (ref 1.7–2.4)

## 2019-02-06 LAB — C-REACTIVE PROTEIN: CRP: 2.6 mg/dL — ABNORMAL HIGH (ref <1.0)

## 2019-02-06 LAB — PHOSPHORUS: Phosphorus: 4.7 mg/dL — ABNORMAL HIGH (ref 2.5–4.6)

## 2019-02-06 MED ORDER — INSULIN GLARGINE 100 UNIT/ML ~~LOC~~ SOLN
30.0000 [IU] | Freq: Every day | SUBCUTANEOUS | Status: DC
  Administered 2019-02-06: 21:00:00 30 [IU] via SUBCUTANEOUS
  Filled 2019-02-06 (×2): qty 0.3

## 2019-02-06 MED ORDER — INSULIN ASPART 100 UNIT/ML ~~LOC~~ SOLN
20.0000 [IU] | Freq: Three times a day (TID) | SUBCUTANEOUS | Status: DC
  Administered 2019-02-06 – 2019-02-08 (×7): 20 [IU] via SUBCUTANEOUS

## 2019-02-06 NOTE — Progress Notes (Signed)
PROGRESS NOTE    Amanda Wiley  ZYS:[id number] DOB: [birth year] DOA: 02/03/2019 PCP: Einar Pheasant, MD   Brief Narrative:  63 year old WF PMHx COPD, DM 2, HTN, HLD, hepatomegaly, and obesity   who was originally admitted to [facility] 10/5 > 10/12 for COVID PNA w/ prolonged hypoxic resp failure. She was d/c home newly on O2 at 5LPM. Since returning home, she has not thrived. She remains stable when at rest, but has had signif difficulty w/ dyspnea w/ even the slightest of exertion, not markedly unlike she was experiencing at the time of her d/c. Over the few days prior to her re-admit she began to experience worsening of her DOE. She consulted w/ her PCP, who advised her to seek attention in the [facility] ED.   At the [facility] ED she underwent a CTa of the chest which did not reveal PE, but was remarkable for considerable persisiting B parenchymal change. Given the extent of her dyspnea, and her saturations in the 80s, she was admitted to the [facility] ICU.   Based upon the timing of her prior COVID testing and active sx, it was discussed w/ the team at [facility] that she was no longer an infectious risk in regard to [place] (as defined by ID). Nonetheless, the staff at [facility] felt most comfortable keeping her on isolation. Due to this fact it was felt to be in her best interest to transfer to [facility] to facilitate effective care.    Subjective: 10/30 A/O x4, negative CP negative abdominal pain, positive S OB, positive productive cough.   Assessment & Plan:   Active Problems:   Obesity, diabetes, and hypertension syndrome (HCC)   Essential (primary) hypertension   Acute respiratory failure due to COVID-19 [facility])   Acute respiratory disease due to COVID-19 virus   Acute respiratory failure with hypoxia (HCC)   COPD (chronic obstructive pulmonary disease) (HCC)   Diabetes mellitus type 2, uncontrolled, with complications (HCC)   Hepatic steatosis   Respiratory failure with hypoxia recurrent -Combivent QID  -Dulera 200-5- BID -Solu-Medrol 40 mg BID -Remdesivir, convalescent plasma negative role at this point her treatment.   COVID-19 Labs  Recent Labs    02/05/19 0505 02/05/19 0506 02/06/19 0502 02/06/19 0503  DDIMER  --  2.09*  --  1.25*  CRP 5.9*  --  2.6*  --     Lab Results  Component Value Date   SARSCOV2NAA POSITIVE (A) 02/02/2019   SARSCOV2NAA Detected (A) 01/08/2019  -Per patient did have some fluid retention and had some relief with diuresis. -Echocardiogram pending, will determine if cardiac component -Lasix 40 mg x 3 doses -Strict in and out -5.2 L -Daily weight Filed Weights   02/03/19 1546 02/04/19 0500  Weight: 95.2 kg 94.2 kg  -Albuterol  -10/29 echocardiogram normal see results below  COPD -See acute respiratory failure with hypoxia  Diabetes type 2 uncontrolled -10/6 Hemoglobin A1c= 7.3 -10/30 increase Lantus 30 units QHS -10/30 increase NovoLog 20 units qac  -Resistant SSI  Essential HTN -Currently controlled without meds monitor closely  Hepatic steatosis/hepatomegaly -Chronic issue  Polycythemia vera -Hemoglobin stable in normal range at this time  Morbid obesity -Body mass index 35.55 kg/m  Polycythemia vera -Hgb stable in a normal range at this time   Hypokalemia -Potassium goal> 4  Hypomagnesmia -Magnesium goal> 2    DVT prophylaxis: Lovenox Code Status: DNR Family Communication: 10/29 spoke with Dellis Filbert (husband) counseled on plan of care answered all questions Disposition Plan: TBD   Consultants:  PCCM  Procedures/Significant Events:  10/1 first COVID+ test  10/5 admit to [facility] w/ COVID pneumonia  10/12 D/C from [place] on home 5L O2   10/29 echocardiogram;Left Ventricle: LVEF=60 to 65%.  -Indeterminate diastolic filling due to E-A fusion.      I have personally reviewed and interpreted all radiology studies and my findings are as above.  VENTILATOR SETTINGS: HFNC Flow rate; 15 L/min FiO2 85%    Cultures    Antimicrobials: Anti-infectives (From admission, onward)   None      Devices    LINES / TUBES:      Continuous Infusions:   Objective: Vitals:   02/06/19 0800 02/06/19 0852 02/06/19 0853 02/06/19 0900  BP: 121/74     Pulse: (!) 106 (!) 119  (!) 119  Resp: (!) 25 (!) 25  (!) 23  Temp:      TempSrc:      SpO2: (!) 88% (!) 89% (!) 89% (!) 85%  Weight:      Height:        Intake/Output Summary (Last 24 hours) at 02/06/2019 2778 Last data filed at 02/06/2019 0800 Gross per 24 hour  Intake 1640 ml  Output 2400 ml  Net -760 ml   Filed Weights   02/03/19 1546 02/04/19 0500  Weight: 95.2 kg 94.2 kg    Physical Exam:  General: A/O x4, positive acute respiratory distress Eyes: negative scleral hemorrhage, negative anisocoria, negative icterus ENT: Negative Runny nose, negative gingival bleeding, Neck:  Negative scars, masses, torticollis, lymphadenopathy, JVD Lungs: Tachypneic, diffuse rhonchi,without wheezes or crackles Cardiovascular: Tachycardic without murmur gallop or rub normal S1 and S2 Abdomen: MORBIDLY Obese negative abdominal pain, nondistended, positive soft, bowel sounds, no rebound, no ascites, no appreciable mass Extremities: No significant cyanosis, clubbing, or edema bilateral lower extremities Skin: Negative rashes, lesions, ulcers Psychiatric:  Negative depression, negative anxiety, negative fatigue, negative mania  Central nervous system:  Cranial nerves II through XII intact, tongue/uvula midline, all extremities muscle strength 5/5, sensation intact throughout, negative dysarthria, negative expressive aphasia, negative receptive aphasia.   Data Reviewed: Care during the described time interval was provided by me .  I have reviewed this patient's available data, including medical history, events of note, physical examination, and all test results as part of my evaluation.   CBC: Recent Labs  Lab 02/02/19 1246 02/03/19 0404 02/04/19 0630  02/05/19 0506 02/06/19 0503  WBC 9.5 10.0 7.9 9.2 10.7*  NEUTROABS 7.4  --  6.2 7.9* 8.3*  HGB 15.2* 13.1 14.1 13.7 14.5  HCT 49.5* 42.3 45.8 44.1 45.7  MCV 91.2 91.0 91.8 91.5 90.0  PLT 279 296 252 398 242*   Basic Metabolic Panel: Recent Labs  Lab 02/02/19 1246 02/03/19 0404 02/04/19 1823 02/05/19 0506 02/06/19 0503  NA 143 143 136 134* 135  K 3.9 3.4* 4.5 4.7 3.7  CL 100 102 93* 92* 88*  CO2 28 29 27 29  32  GLUCOSE 260* 167* 430* 447* 324*  BUN 10 17 26* 35* 43*  CREATININE 0.64 0.65 0.87 0.94 0.75  CALCIUM 8.6* 8.0* 8.6* 8.6* 8.7*  MG 2.2 1.9 2.0 2.2 2.3  PHOS  --   --  2.5 4.3 4.7*   GFR: Estimated Creatinine Clearance: 81.2 mL/min (by C-G formula based on SCr of 0.75 mg/dL). Liver Function Tests: Recent Labs  Lab 02/02/19 1246 02/04/19 1823 02/05/19 0506 02/06/19 0503  AST 22 25 23 24   ALT 18 22 22 26   ALKPHOS 90 77 74 69  BILITOT 0.7 0.7  0.5 0.8  PROT 8.0 7.5 7.1 7.3  ALBUMIN 2.5* 2.4* 2.5* 2.7*   No results for input(s): LIPASE, AMYLASE in the last 168 hours. No results for input(s): AMMONIA in the last 168 hours. Coagulation Profile: Recent Labs  Lab 02/02/19 1246  INR 1.1   Cardiac Enzymes: No results for input(s): CKTOTAL, CKMB, CKMBINDEX, TROPONINI in the last 168 hours. BNP (last 3 results) No results for input(s): PROBNP in the last 8760 hours. HbA1C: No results for input(s): HGBA1C in the last 72 hours. CBG: Recent Labs  Lab 02/05/19 0740 02/05/19 1239 02/05/19 1635 02/05/19 2200 02/06/19 0735  GLUCAP 406* 372* 337* 420* 362*   Lipid Profile: No results for input(s): CHOL, HDL, LDLCALC, TRIG, CHOLHDL, LDLDIRECT in the last 72 hours. Thyroid Function Tests: No results for input(s): TSH, T4TOTAL, FREET4, T3FREE, THYROIDAB in the last 72 hours. Anemia Panel: No results for input(s): VITAMINB12, FOLATE, FERRITIN, TIBC, IRON, RETICCTPCT in the last 72 hours. Urine analysis:    Component Value Date/Time   COLORURINE STRAW (A)  02/02/2019 1343   APPEARANCEUR CLEAR (A) 02/02/2019 1343   LABSPEC 1.013 02/02/2019 1343   PHURINE 5.0 02/02/2019 1343   GLUCOSEU >=500 (A) 02/02/2019 1343   GLUCOSEU 100 (A) 07/16/2013 1446   HGBUR NEGATIVE 02/02/2019 1343   BILIRUBINUR NEGATIVE 02/02/2019 1343   BILIRUBINUR neg 07/06/2015 1150   KETONESUR NEGATIVE 02/02/2019 1343   PROTEINUR NEGATIVE 02/02/2019 1343   UROBILINOGEN 0.2 07/06/2015 1150   UROBILINOGEN 0.2 07/16/2013 1446   NITRITE NEGATIVE 02/02/2019 1343   LEUKOCYTESUR NEGATIVE 02/02/2019 1343   Sepsis Labs: @LABRCNTIP (procalcitonin:4,lacticidven:4)  ) Recent Results (from the past 240 hour(s))  Blood culture (routine x 2)     Status: None (Preliminary result)   Collection Time: 02/02/19 12:46 PM   Specimen: BLOOD  Result Value Ref Range Status   Specimen Description BLOOD LEFT ANTECUBITAL  Final   Special Requests   Final    BOTTLES DRAWN AEROBIC AND ANAEROBIC Blood Culture adequate volume   Culture   Final    NO GROWTH 4 DAYS Performed at [facility], [address], [place],  [address]    Report Status PENDING  Incomplete  SARS Coronavirus 2 by RT PCR (hospital order, performed in [place] hospital lab) Nasopharyngeal Nasopharyngeal Swab     Status: Abnormal   Collection Time: 02/02/19 12:47 PM   Specimen: Nasopharyngeal Swab  Result Value Ref Range Status   SARS Coronavirus 2 POSITIVE (A) NEGATIVE Final    Comment: RESULT CALLED TO, READ BACK BY AND VERIFIED WITH:  AMBER PAYNE AT 8250, 02/02/2019 SDR (NOTE) If result is NEGATIVE SARS-CoV-2 target nucleic acids are NOT DETECTED. The SARS-CoV-2 RNA is generally detectable in upper and lower  respiratory specimens during the acute phase of infection. The lowest  concentration of SARS-CoV-2 viral copies this assay can detect is 250  copies / mL. A negative result does not preclude SARS-CoV-2 infection  and should not be used as the sole basis for treatment or other  patient  management decisions.  A negative result may occur with  improper specimen collection / handling, submission of specimen other  than nasopharyngeal swab, presence of viral mutation(s) within the  areas targeted by this assay, and inadequate number of viral copies  (<250 copies / mL). A negative result must be combined with clinical  observations, patient history, and epidemiological information. If result is POSITIVE SARS-CoV-2 target nucleic acids are DETECTED.  The SARS-CoV-2 RNA is generally detectable in upper and  lower  respiratory specimens during the acute phase of infection.  Positive  results are indicative of active infection with SARS-CoV-2.  Clinical  correlation with patient history and other diagnostic information is  necessary to determine patient infection status.  Positive results do  not rule out bacterial infection or co-infection with other viruses. If result is PRESUMPTIVE POSTIVE SARS-CoV-2 nucleic acids MAY BE PRESENT.   A presumptive positive result was obtained on the submitted specimen  and confirmed on repeat testing.  While 2019 novel coronavirus  (SARS-CoV-2) nucleic acids may be present in the submitted sample  additional confirmatory testing may be necessary for epidemiological  and / or clinical management purposes  to differentiate between  SARS-CoV-2 and other Sarbecovirus currently known to infect humans.  If clinically indicated additional testing with an alternate test  methodology [phone]) i s advised. The SARS-CoV-2 RNA is generally  detectable in upper and lower respiratory specimens during the acute  phase of infection. The expected result is Negative. Fact Sheet for Patients:  StrictlyIdeas.no Fact Sheet for Healthcare Providers: BankingDealers.co.za This test is not yet approved or cleared by the [place] FDA and has been authorized for detection and/or diagnosis of SARS-CoV-2 by FDA under  an Emergency Use Authorization (EUA).  This EUA will remain in effect (meaning this test can be used) for the duration of the COVID-19 declaration under Section 564(b)(1) of the Act, 21 U.S.C. section 360bbb-3(b)(1), unless the authorization is terminated or revoked sooner. Performed at [facility], [address] [place], [place] [address]   Blood culture (routine x 2)     Status: None (Preliminary result)   Collection Time: 02/02/19  1:04 PM   Specimen: BLOOD  Result Value Ref Range Status   Specimen Description BLOOD RIGHT [facility]  Final   Special Requests   Final    BOTTLES DRAWN AEROBIC AND ANAEROBIC Blood Culture adequate volume   Culture   Final    NO GROWTH 4 DAYS Performed at [facility], [address], [place], [place] [address]    Report Status PENDING  Incomplete  Urine culture     Status: Abnormal   Collection Time: 02/02/19  1:43 PM   Specimen: Urine, Clean Catch  Result Value Ref Range Status   Specimen Description   Final    URINE, CLEAN CATCH Performed at [facility], [address] [place], [place] [address]    Special Requests   Final    NONE Performed at [facility], [place],  [address]    Culture >=100,000 COLONIES/mL KLEBSIELLA PNEUMONIAE (A)  Final   Report Status 02/04/2019 FINAL  Final   Organism ID, Bacteria KLEBSIELLA PNEUMONIAE (A)  Final      Susceptibility   Klebsiella pneumoniae - MIC*    AMPICILLIN RESISTANT Resistant     CEFAZOLIN <=4 SENSITIVE Sensitive     CEFTRIAXONE <=1 SENSITIVE Sensitive     CIPROFLOXACIN <=0.25 SENSITIVE Sensitive     GENTAMICIN <=1 SENSITIVE Sensitive     IMIPENEM <=0.25 SENSITIVE Sensitive     NITROFURANTOIN 128 RESISTANT Resistant     TRIMETH/SULFA <=20 SENSITIVE Sensitive     AMPICILLIN/SULBACTAM 4 SENSITIVE Sensitive     PIP/TAZO <=4 SENSITIVE Sensitive     Extended ESBL NEGATIVE Sensitive     * >=100,000 COLONIES/mL KLEBSIELLA PNEUMONIAE   MRSA PCR Screening     Status: None   Collection Time: 02/03/19 12:24 AM   Specimen: Nasal Mucosa; Nasopharyngeal  Result Value  Ref Range Status   MRSA by PCR NEGATIVE NEGATIVE Final    Comment:        The GeneXpert MRSA Assay (FDA approved for NASAL specimens only), is one component of a comprehensive MRSA colonization surveillance program. It is not intended to diagnose MRSA infection nor to guide or monitor treatment for MRSA infections. Performed at [facility], [address] [place], [place] [address]          Radiology Studies: Dg Chest [facility] 1 View  Result Date: 02/06/2019 CLINICAL DATA:  Acute respiratory failure with hypoxemia. EXAM: PORTABLE CHEST 1 VIEW COMPARISON:  02/05/2019 FINDINGS: Normal sized heart. Marked diffuse prominence of the interstitial markings and associated patchy densities with mild improvement. Stable left PICC with its tip approximately 1.5 cm superior to the superior cavoatrial junction. Right shoulder fixation hardware. IMPRESSION: Mildly improved bilateral pneumonia and interstitial disease. Electronically Signed   By: Claudie Revering M.D.   On: 02/06/2019 07:10   Dg Chest Port 1 View  Result Date: 02/05/2019 CLINICAL DATA:  Endotracheal tube. EXAM: PORTABLE CHEST 1 VIEW COMPARISON:  December 03, 2018. FINDINGS: The heart size and mediastinal contours are within normal limits. No pneumothorax pleural effusion is noted. Stable diffuse bilateral lung opacities are noted. The visualized skeletal structures are unremarkable. IMPRESSION: Stable bilateral lung opacities consistent with multifocal pneumonia. Electronically Signed   By: Marijo Conception M.D.   On: 02/05/2019 08:39   [place] Ekg Site Rite  Result Date: 02/04/2019 If Site Rite image not attached, placement could not be confirmed due to current cardiac rhythm.       Scheduled Meds: . Chlorhexidine Gluconate Cloth  6 each Topical Daily  . colchicine  0.6 mg Oral BID  .  enoxaparin (LOVENOX) injection  40 mg Subcutaneous Q12H  . insulin aspart  0-20 Units Subcutaneous TID WC  . insulin aspart  0-5 Units Subcutaneous QHS  . insulin aspart  10 Units Subcutaneous TID WC  . insulin glargine  20 Units Subcutaneous QHS  . insulin glargine  20 Units Subcutaneous q morning - 10a  . Ipratropium-Albuterol  1 puff Inhalation BID  . linagliptin  5 mg Oral Daily  . mouth rinse  15 mL Mouth Rinse BID  . methylPREDNISolone sodium succinate  40 mg Intravenous Q12H  . mometasone-formoterol  2 puff Inhalation BID  . pantoprazole  40 mg Oral Daily  . sodium chloride flush  10-40 mL Intracatheter Q12H  . ascorbic acid  1,000 mg Oral Daily  . zinc sulfate  220 mg Oral Daily   Continuous Infusions:   LOS: 3 days   The patient is critically ill with multiple organ systems failure and requires high complexity decision making for assessment and support, frequent evaluation and titration of therapies, application of advanced monitoring technologies and extensive interpretation of multiple databases. Critical Care Time devoted to patient care services described in this note  Time spent: 40 minutes     WOODS, Geraldo Docker, MD [facility] Pager [phone]  If 7PM-7AM, please contact night-coverage [email or web address] Password [email or web address] 02/06/2019, 9:23 AM

## 2019-02-06 NOTE — Progress Notes (Signed)
Patient transfer received from ICU.  Patient transferred via wheelchair, assisted to restroom prior to settling into bed.  Patient currently on 6L o2 via [place]. Non-tele. Noted dyspnea with activty, patient steady with standby assistance x1. IS pull 217m.    02/06/19 1605  Vitals  Temp 98 F (36.7 C)  Temp Source Oral  BP 130/62  MAP (mmHg) 78  BP Location Right Wrist  BP Method Automatic  Patient Position (if appropriate) Lying  Pulse Rate (!) 108  Pulse Rate Source Monitor  Resp (!) 22  Oxygen Therapy  SpO2 99 %  O2 Device Nasal Cannula  O2 Flow Rate (L/min) 6 L/min  Patient Activity (if Appropriate) In bed  Pulse Oximetry Type Continuous  Oximetry Probe Site Changed No  Pain Assessment  Pain Scale 0-10  Pain Score 0  MEWS Score  MEWS RR 1  MEWS Pulse 1  MEWS Systolic 0  MEWS LOC 0  MEWS Temp 0  MEWS Score 2  MEWS Score Color Yellow

## 2019-02-06 NOTE — Progress Notes (Signed)
Amanda Wiley spoke with her husband on her own cell phone, and they are able to send text messages back and forth throughout the day. Questions were asked and answered as needed.

## 2019-02-06 NOTE — Progress Notes (Addendum)
NAME:  ABBEYGAIL IGOE, MRN:  [id number], DOB:  [birth year], LOS: 3 ADMISSION DATE:  02/03/2019, CONSULTATION DATE:  02/03/19 REFERRING MD:  TRH Thereasa Solo, CHIEF COMPLAINT:  Acute hypoxemic respiratory failure with COVID-19   Brief History   63 year old female with history of COPD, DM, HTN and hepatomegally who was admitted on 10/5 to [facility] discharged on the 12 for COVID pneumonia.  Patient was discharged home on home O2 but returns back to [facility] for worsening respiratory status and hypoxemia.  Patient was retested and naturally remained positive and recommendations were made to d/c isolation per [facility] policy but staff felt uncomfortable with that and the patient was sent to [facility] for further management.  PCCM was consulted to assist with hypoxemic failure.  History of present illness   63 year old female with history of COPD, DM, HTN and hepatomegally who was admitted on 10/5 to [facility] discharged on the 12 for COVID pneumonia.  Patient was discharged home on home O2 but returns back to [facility] for worsening respiratory status and hypoxemia.  Patient was retested and naturally remained positive and recommendations were made to d/c isolation per [facility] policy but staff felt uncomfortable with that and the patient was sent to [facility] for further management.  PCCM was consulted to assist with hypoxemic failure.  Patient reports DOE, PND and severe fatigue  Past Medical History  HTN DM HLD Hepatomegaly  Morbid obesity COVID COPD  Significant Hospital Events   N/A  Consults:  PCCM 10/27  Procedures:  N/A  Significant Diagnostic Tests:  Chest CT with contrast on 10/27 with diffuse interstitial changes, right sided pleural effusion and no PE that I reviewed myself  Micro Data:  None  Antimicrobials:  None   Interim history/subjective:  O2 demand improving Down to [place]ter at 15 liters no HFNC  Objective   Blood pressure 121/74, pulse (!) 119, temperature 98.2 F (36.8 C), temperature source Oral,  resp. rate (!) 23, height 5' 5.35" (1.66 m), weight 94.2 kg, SpO2 (!) 85 %.        Intake/Output Summary (Last 24 hours) at 02/06/2019 1143 Last data filed at 02/06/2019 0800 Gross per 24 hour  Intake 1640 ml  Output 2400 ml  Net -760 ml   Filed Weights   02/03/19 1546 02/04/19 0500  Weight: 95.2 kg 94.2 kg   Examination: General: Chronically ill appearing female but appears better this AM HENT: [place]/AT, PERRL, EOM-I and MMM Lungs: Bibasilar crackles Cardiovascular: RRR, Nl S1/S2 and -M/R/G Abdomen: Soft, NT, ND and +BS Extremities: -edema and -tenderness Neuro: Alert and interactive moving all ext to command Skin: Intact  I reviewed CXR myself, infiltrate noted  Discussed with TRH-MD  Resolved Hospital Problem list   N/A  Assessment & Plan:  63 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  Recommendations: Hold off other covid specific treatment for now short of steroids Titrate O2 for sat of 85-92% D/C further ABG Code status DNR Change lasix to maintenance level diuretics at this point Replace electrolytes as indicated BMET in AM Advance diet OOB to chair PT/OT  PCCM will sign off, please call back if needed  Labs   CBC: Recent Labs  Lab 02/02/19 1246 02/03/19 0404 02/04/19 0630 02/05/19 0506 02/06/19 0503  WBC 9.5 10.0 7.9 9.2 10.7*  NEUTROABS 7.4  --  6.2 7.9* 8.3*  HGB 15.2* 13.1 14.1 13.7 14.5  HCT 49.5* 42.3  45.8 44.1 45.7  MCV 91.2 91.0 91.8 91.5 90.0  PLT 279 296 252 398 445*    Basic Metabolic Panel: Recent Labs  Lab 02/02/19 1246 02/03/19 0404 02/04/19 1823 02/05/19 0506 02/06/19 0503  NA 143 143 136 134* 135  K 3.9 3.4* 4.5 4.7 3.7  CL 100 102 93* 92* 88*  CO2 28 29 27 29  32  GLUCOSE 260* 167* 430* 447* 324*  BUN 10 17 26* 35* 43*  CREATININE 0.64 0.65 0.87 0.94 0.75  CALCIUM 8.6* 8.0* 8.6* 8.6* 8.7*  MG 2.2 1.9 2.0 2.2 2.3  PHOS  --    --  2.5 4.3 4.7*   GFR: Estimated Creatinine Clearance: 81.2 mL/min (by C-G formula based on SCr of 0.75 mg/dL). Recent Labs  Lab 02/02/19 1247 02/02/19 1444 02/03/19 0404 02/04/19 0630 02/05/19 0506 02/06/19 0503  PROCALCITON  --  0.14 0.11  --   --   --   WBC  --   --  10.0 7.9 9.2 10.7*  LATICACIDVEN 2.0* 1.5  --   --   --   --     Liver Function Tests: Recent Labs  Lab 02/02/19 1246 02/04/19 1823 02/05/19 0506 02/06/19 0503  AST 22 25 23 24   ALT 18 22 22 26   ALKPHOS 90 77 74 69  BILITOT 0.7 0.7 0.5 0.8  PROT 8.0 7.5 7.1 7.3  ALBUMIN 2.5* 2.4* 2.5* 2.7*   No results for input(s): LIPASE, AMYLASE in the last 168 hours. No results for input(s): AMMONIA in the last 168 hours.  ABG    Component Value Date/Time   PHART 7.50 (H) 02/02/2019 1343   PCO2ART 39 02/02/2019 1343   PO2ART 60 (L) 02/02/2019 1343   HCO3 30.4 (H) 02/02/2019 1343   O2SAT 92.8 02/02/2019 1343     Coagulation Profile: Recent Labs  Lab 02/02/19 1246  INR 1.1    Cardiac Enzymes: No results for input(s): CKTOTAL, CKMB, CKMBINDEX, TROPONINI in the last 168 hours.  HbA1C: Hemoglobin A1C  Date/Time Value Ref Range Status  02/20/2018 08:25 AM 6.7 (A) 4.0 - 5.6 % Final   Hgb A1c MFr Bld  Date/Time Value Ref Range Status  01/13/2019 04:22 AM 7.3 (H) 4.8 - 5.6 % Final    Comment:    (NOTE) Pre diabetes:          5.7%-6.4% Diabetes:              >6.4% Glycemic control for   <7.0% adults with diabetes   02/01/2017 08:01 AM 6.9 (H) 4.6 - 6.5 % Final    Comment:    Glycemic Control Guidelines for People with Diabetes:Non Diabetic:  <6%Goal of Therapy: <7%Additional Action Suggested:  >8%     CBG: Recent Labs  Lab 02/05/19 0740 02/05/19 1239 02/05/19 1635 02/05/19 2200 02/06/19 0735  GLUCAP 406* 372* 337* 420* 362*   Rush Farmer, M.D. [facility] Pulmonary/Critical Care Medicine.

## 2019-02-06 NOTE — Progress Notes (Signed)
Inpatient Diabetes Program Recommendations  AACE/ADA: New Consensus Statement on Inpatient Glycemic Control   Target Ranges:  Prepandial:   less than 140 mg/dL      Peak postprandial:   less than 180 mg/dL (1-2 hours)      Critically ill patients:  140 - 180 mg/dL   Results for Amanda Wiley, Amanda Wiley (MRN [id number]) as of 02/06/2019 09:44  Ref. Range 02/05/2019 07:40 02/05/2019 12:39 02/05/2019 16:35 02/05/2019 22:00 02/06/2019 07:35  Glucose-Capillary Latest Ref Range: 70 - 99 mg/dL 406 (H) 372 (H) 337 (H) 420 (H) 362 (H)   Review of Glycemic Control  Diabetes history:DM2 Outpatient Diabetes medications:Novolog 0-9 units TID with meals, Novolog 0-5 units QHS Current orders for Inpatient glycemic control:Lantus 20 units QAM, Lantus 20 units QHS, Novolog 0-20 units TID with meals, Novolog 0-5 units QHS, Novolog 10 units TID with meals for meal coverage, Tradjenta 5 mg daily; Solumedrol 40 mg Q12H  Inpatient Diabetes Program Recommendations:  Insulin-Basal:  If steroids are continued, please consider increasing Lantus to 30 units BID.  Insulin-Meal Coverage:If steroids are continued, please consider increasing meal coverage to Novolog 15 units TID with meals if patient eats at least 50% of meals.  Thanks, Barnie Alderman, RN, MSN, CDE Diabetes Coordinator Inpatient Diabetes Program [phone] (Team Pager from 8am to 5pm)

## 2019-02-06 NOTE — Progress Notes (Signed)
Need for patient to be moved to room 208-3 due to pt transfer and no female beds available. Patient ok with this transfer. Moved by bed, this nurse, oxygen, CR monitor. Room orientation complete. Quick SBAR given to nurse in room for when I'm away from patient. No needs stated by patient.

## 2019-02-07 ENCOUNTER — Inpatient Hospital Stay (HOSPITAL_COMMUNITY)

## 2019-02-07 LAB — CBC WITH DIFFERENTIAL/PLATELET
Abs Immature Granulocytes: 0.09 10*3/uL — ABNORMAL HIGH (ref 0.00–0.07)
Basophils Absolute: 0 10*3/uL (ref 0.0–0.1)
Basophils Relative: 0 %
Eosinophils Absolute: 0 10*3/uL (ref 0.0–0.5)
Eosinophils Relative: 0 %
HCT: 46.8 % — ABNORMAL HIGH (ref 36.0–46.0)
Hemoglobin: 14.7 g/dL (ref 12.0–15.0)
Immature Granulocytes: 1 %
Lymphocytes Relative: 10 %
Lymphs Abs: 1 10*3/uL (ref 0.7–4.0)
MCH: 27.9 pg (ref 26.0–34.0)
MCHC: 31.4 g/dL (ref 30.0–36.0)
MCV: 89 fL (ref 80.0–100.0)
Monocytes Absolute: 0.6 10*3/uL (ref 0.1–1.0)
Monocytes Relative: 7 %
Neutro Abs: 7.7 10*3/uL (ref 1.7–7.7)
Neutrophils Relative %: 82 %
Platelets: 442 10*3/uL — ABNORMAL HIGH (ref 150–400)
RBC: 5.26 MIL/uL — ABNORMAL HIGH (ref 3.87–5.11)
RDW: 14.7 % (ref 11.5–15.5)
WBC: 9.4 10*3/uL (ref 4.0–10.5)
nRBC: 0 % (ref 0.0–0.2)

## 2019-02-07 LAB — MAGNESIUM: Magnesium: 2.6 mg/dL — ABNORMAL HIGH (ref 1.7–2.4)

## 2019-02-07 LAB — COMPREHENSIVE METABOLIC PANEL
ALT: 28 U/L (ref 0–44)
AST: 26 U/L (ref 15–41)
Albumin: 2.7 g/dL — ABNORMAL LOW (ref 3.5–5.0)
Alkaline Phosphatase: 62 U/L (ref 38–126)
Anion gap: 12 (ref 5–15)
BUN: 46 mg/dL — ABNORMAL HIGH (ref 8–23)
CO2: 31 mmol/L (ref 22–32)
Calcium: 9.1 mg/dL (ref 8.9–10.3)
Chloride: 92 mmol/L — ABNORMAL LOW (ref 98–111)
Creatinine, Ser: 0.77 mg/dL (ref 0.44–1.00)
GFR calc Af Amer: >60 mL/min (ref >60)
GFR calc non Af Amer: >60 mL/min (ref >60)
Glucose, Bld: 367 mg/dL — ABNORMAL HIGH (ref 70–99)
Potassium: 4.4 mmol/L (ref 3.5–5.1)
Sodium: 135 mmol/L (ref 135–145)
Total Bilirubin: 0.4 mg/dL (ref 0.3–1.2)
Total Protein: 7.1 g/dL (ref 6.5–8.1)

## 2019-02-07 LAB — CULTURE, BLOOD (ROUTINE X 2)
Culture: NO GROWTH
Culture: NO GROWTH
Special Requests: ADEQUATE
Special Requests: ADEQUATE

## 2019-02-07 LAB — PHOSPHORUS: Phosphorus: 4.7 mg/dL — ABNORMAL HIGH (ref 2.5–4.6)

## 2019-02-07 LAB — D-DIMER, QUANTITATIVE: D-Dimer, Quant: 0.89 ug/mL-FEU — ABNORMAL HIGH (ref 0.00–0.50)

## 2019-02-07 LAB — GLUCOSE, CAPILLARY
Glucose-Capillary: 328 mg/dL — ABNORMAL HIGH (ref 70–99)
Glucose-Capillary: 290 mg/dL — ABNORMAL HIGH (ref 70–99)
Glucose-Capillary: 120 mg/dL — ABNORMAL HIGH (ref 70–99)

## 2019-02-07 LAB — C-REACTIVE PROTEIN: CRP: 1.2 mg/dL — ABNORMAL HIGH (ref <1.0)

## 2019-02-07 MED ORDER — FUROSEMIDE 10 MG/ML IJ SOLN: 40.0000 mg | Freq: Two times a day (BID) | INTRAMUSCULAR | Status: DC

## 2019-02-07 MED ORDER — FUROSEMIDE 10 MG/ML IJ SOLN
40.0000 mg | Freq: Two times a day (BID) | INTRAMUSCULAR | Status: AC
  Administered 2019-02-07 – 2019-02-10 (×6): 40 mg via INTRAVENOUS
  Filled 2019-02-07 (×6): qty 4

## 2019-02-07 MED ORDER — INSULIN GLARGINE 100 UNIT/ML ~~LOC~~ SOLN
30.0000 [IU] | Freq: Two times a day (BID) | SUBCUTANEOUS | Status: DC
  Administered 2019-02-07 – 2019-02-10 (×6): 30 [IU] via SUBCUTANEOUS
  Filled 2019-02-07 (×7): qty 0.3

## 2019-02-07 MED ORDER — METHYLPREDNISOLONE SODIUM SUCC 40 MG IJ SOLR
40.0000 mg | Freq: Two times a day (BID) | INTRAMUSCULAR | Status: DC
  Administered 2019-02-07 – 2019-02-10 (×7): 40 mg via INTRAVENOUS
  Filled 2019-02-07 (×7): qty 1

## 2019-02-07 MED ORDER — INSULIN GLARGINE 100 UNIT/ML ~~LOC~~ SOLN
10.0000 [IU] | Freq: Once | SUBCUTANEOUS | Status: AC
  Administered 2019-02-07: 18:00:00 10 [IU] via SUBCUTANEOUS
  Filled 2019-02-07: qty 0.1

## 2019-02-07 MED ORDER — METOPROLOL TARTRATE 25 MG PO TABS
12.5000 mg | ORAL_TABLET | Freq: Two times a day (BID) | ORAL | Status: DC
  Administered 2019-02-07 – 2019-02-08 (×2): 12.5 mg via ORAL
  Filled 2019-02-07 (×3): qty 1

## 2019-02-07 NOTE — Progress Notes (Signed)
PROGRESS NOTE    Amanda Wiley  OYD:[id number] DOB: 03-28-1956 DOA: 02/03/2019 PCP: Einar Pheasant, MD   Brief Narrative:  63 year old WF PMHx COPD, DM 2, HTN, HLD, hepatomegaly, and obesity   who was originally admitted to [facility] 10/5 > 10/12 for COVID PNA w/ prolonged hypoxic resp failure. She was d/c home newly on O2 at 5LPM. Since returning home, she has not thrived. She remains stable when at rest, but has had signif difficulty w/ dyspnea w/ even the slightest of exertion, not markedly unlike she was experiencing at the time of her d/c. Over the few days prior to her re-admit she began to experience worsening of her DOE. She consulted w/ her PCP, who advised her to seek attention in the [facility] ED.   At the [facility] ED she underwent a CTa of the chest which did not reveal PE, but was remarkable for considerable persisiting B parenchymal change. Given the extent of her dyspnea, and her saturations in the 80s, she was admitted to the [facility] ICU.   Based upon the timing of her prior COVID testing and active sx, it was discussed w/ the team at [facility] that she was no longer an infectious risk in regard to Ray (as defined by ID). Nonetheless, the staff at [facility] felt most comfortable keeping her on isolation. Due to this fact it was felt to be in her best interest to transfer to [facility] to facilitate effective care.    Subjective: 10/31 A/O x4, negative CP, negative abdominal pain, positive S OB, positive productive cough states the sputum is beginning to turn clear.   Assessment & Plan:   Active Problems:   Obesity, diabetes, and hypertension syndrome (HCC)   Essential (primary) hypertension   Acute respiratory failure due to COVID-19 [facility])   Acute respiratory disease due to COVID-19 virus   Acute respiratory failure with hypoxia (HCC)   COPD (chronic obstructive pulmonary disease) (HCC)   Diabetes mellitus type 2, uncontrolled, with complications (HCC)   Hepatic steatosis   Respiratory failure  with hypoxia recurrent -Combivent QID -Dulera 200-5- BID -Solu-Medrol 40 mg BID -Remdesivir, convalescent plasma negative role at this point her treatment.   COVID-19 Labs  Recent Labs    02/05/19 0505 02/05/19 0506 02/06/19 0502 02/06/19 0503 02/07/19 0230  DDIMER  --  2.09*  --  1.25* 0.89*  CRP 5.9*  --  2.6*  --  1.2*    Lab Results  Component Value Date   SARSCOV2NAA POSITIVE (A) 02/02/2019   SARSCOV2NAA Detected (A) 01/08/2019  -Per patient did have some fluid retention and had some relief with diuresis. -Echocardiogram pending, will determine if cardiac component -10/31 Lasix IV 40 mg x 6 doses  -Strict in and out -3.6 L -Daily weight Filed Weights   02/04/19 0500 02/06/19 0500 02/07/19 0500  Weight: 94.2 kg 94.2 kg 91.8 kg  -Albuterol  -10/29 echocardiogram normal see results below  COPD -See acute respiratory failure with hypoxia  Diabetes type 2 uncontrolled -10/6 Hemoglobin A1c= 7.3 -10/30 increase NovoLog 20 units qac  -10/31 increase Lantus 30 units BID -Resistant SSI  Essential HTN -10/31 metoprolol 12.5 mg BID  Hepatic steatosis/hepatomegaly -Chronic issue  Polycythemia vera -Hemoglobin stable in normal range at this time  Morbid obesity -Body mass index 35.55 kg/m  Polycythemia vera -Hgb stable in a normal range at this time   Hypokalemia -Potassium goal> 4  Hypomagnesmia -Magnesium goal> 2    DVT prophylaxis: Lovenox Code Status: DNR Family Communication: 10/31 spoke with  Dellis Filbert (husband) counseled on plan of care answered all questions Disposition Plan: TBD   Consultants:  PCCM  Procedures/Significant Events:  10/1 first COVID+ test  10/5 admit to [facility] w/ COVID pneumonia  10/12 D/C from [facility] on home 5L O2   10/29 echocardiogram;Left Ventricle: LVEF=60 to 65%.  -Indeterminate diastolic filling due to E-A fusion.      I have personally reviewed and interpreted all radiology studies and my findings are as above.   VENTILATOR SETTINGS: HFNC Flow rate; 12 L/min FiO2 93%    Cultures   Antimicrobials: Anti-infectives (From admission, onward)   None      Devices    LINES / TUBES:      Continuous Infusions:   Objective: Vitals:   02/07/19 0500 02/07/19 0800 02/07/19 0815 02/07/19 0900  BP:   125/90   Pulse: 88 (!) 119 (!) 105 (!) 127  Resp:   (!) 22 (!) 24  Temp:   98.2 F (36.8 C)   TempSrc:   Oral   SpO2: 91% (!) 75% (!) 87% 90%  Weight: 91.8 kg     Height:        Intake/Output Summary (Last 24 hours) at 02/07/2019 0914 Last data filed at 02/07/2019 4158 Gross per 24 hour  Intake 1220 ml  Output -  Net 1220 ml   Filed Weights   02/04/19 0500 02/06/19 0500 02/07/19 0500  Weight: 94.2 kg 94.2 kg 91.8 kg   Physical Exam:  General: A/O x4, positive acute respiratory distress Eyes: negative scleral hemorrhage, negative anisocoria, negative icterus ENT: Negative Runny nose, negative gingival bleeding, Neck:  Negative scars, masses, torticollis, lymphadenopathy, JVD Lungs: Tachypneic decreased breath sounds diffusely but clear to auscultation,without wheezes or crackles Cardiovascular: Tachycardic without murmur gallop or rub normal S1 and S2 Abdomen: MORBIDLY OBESE negative abdominal pain, nondistended, positive soft, bowel sounds, no rebound, no ascites, no appreciable mass Extremities: No significant cyanosis, clubbing, or edema bilateral lower extremities Skin: Negative rashes, lesions, ulcers Psychiatric:  Negative depression, negative anxiety, negative fatigue, negative mania  Central nervous system:  Cranial nerves II through XII intact, tongue/uvula midline, all extremities muscle strength 5/5, sensation intact throughout, negative dysarthria, negative expressive aphasia, negative receptive aphasia.   Data Reviewed: Care during the described time interval was provided by me .  I have reviewed this patient's available data, including medical history, events of  note, physical examination, and all test results as part of my evaluation.   CBC: Recent Labs  Lab 02/02/19 1246 02/03/19 0404 02/04/19 0630 02/05/19 0506 02/06/19 0503 02/07/19 0230  WBC 9.5 10.0 7.9 9.2 10.7* 9.4  NEUTROABS 7.4  --  6.2 7.9* 8.3* 7.7  HGB 15.2* 13.1 14.1 13.7 14.5 14.7  HCT 49.5* 42.3 45.8 44.1 45.7 46.8*  MCV 91.2 91.0 91.8 91.5 90.0 89.0  PLT 279 296 252 398 445* 309*   Basic Metabolic Panel: Recent Labs  Lab 02/03/19 0404 02/04/19 1823 02/05/19 0506 02/06/19 0503 02/07/19 0230  NA 143 136 134* 135 135  K 3.4* 4.5 4.7 3.7 4.4  CL 102 93* 92* 88* 92*  CO2 29 27 29  32 31  GLUCOSE 167* 430* 447* 324* 367*  BUN 17 26* 35* 43* 46*  CREATININE 0.65 0.87 0.94 0.75 0.77  CALCIUM 8.0* 8.6* 8.6* 8.7* 9.1  MG 1.9 2.0 2.2 2.3 2.6*  PHOS  --  2.5 4.3 4.7* 4.7*   GFR: Estimated Creatinine Clearance: 80.1 mL/min (by C-G formula based on SCr of 0.77 mg/dL). Liver Function Tests:  Recent Labs  Lab 02/02/19 1246 02/04/19 1823 02/05/19 0506 02/06/19 0503 02/07/19 0230  AST 22 25 23 24 26   ALT 18 22 22 26 28   ALKPHOS 90 77 74 69 62  BILITOT 0.7 0.7 0.5 0.8 0.4  PROT 8.0 7.5 7.1 7.3 7.1  ALBUMIN 2.5* 2.4* 2.5* 2.7* 2.7*   No results for input(s): LIPASE, AMYLASE in the last 168 hours. No results for input(s): AMMONIA in the last 168 hours. Coagulation Profile: Recent Labs  Lab 02/02/19 1246  INR 1.1   Cardiac Enzymes: No results for input(s): CKTOTAL, CKMB, CKMBINDEX, TROPONINI in the last 168 hours. BNP (last 3 results) No results for input(s): PROBNP in the last 8760 hours. HbA1C: No results for input(s): HGBA1C in the last 72 hours. CBG: Recent Labs  Lab 02/05/19 1635 02/05/19 2200 02/06/19 0735 02/06/19 1129 02/06/19 1806  GLUCAP 337* 420* 362* 352* 257*   Lipid Profile: No results for input(s): CHOL, HDL, LDLCALC, TRIG, CHOLHDL, LDLDIRECT in the last 72 hours. Thyroid Function Tests: No results for input(s): TSH, T4TOTAL, FREET4,  T3FREE, THYROIDAB in the last 72 hours. Anemia Panel: No results for input(s): VITAMINB12, FOLATE, FERRITIN, TIBC, IRON, RETICCTPCT in the last 72 hours. Urine analysis:    Component Value Date/Time   COLORURINE STRAW (A) 02/02/2019 1343   APPEARANCEUR CLEAR (A) 02/02/2019 1343   LABSPEC 1.013 02/02/2019 1343   PHURINE 5.0 02/02/2019 1343   GLUCOSEU >=500 (A) 02/02/2019 1343   GLUCOSEU 100 (A) 07/16/2013 1446   HGBUR NEGATIVE 02/02/2019 1343   BILIRUBINUR NEGATIVE 02/02/2019 1343   BILIRUBINUR neg 07/06/2015 1150   KETONESUR NEGATIVE 02/02/2019 1343   PROTEINUR NEGATIVE 02/02/2019 1343   UROBILINOGEN 0.2 07/06/2015 1150   UROBILINOGEN 0.2 07/16/2013 1446   NITRITE NEGATIVE 02/02/2019 1343   LEUKOCYTESUR NEGATIVE 02/02/2019 1343   Sepsis Labs: @LABRCNTIP (procalcitonin:4,lacticidven:4)  ) Recent Results (from the past 240 hour(s))  Blood culture (routine x 2)     Status: None   Collection Time: 02/02/19 12:46 PM   Specimen: BLOOD  Result Value Ref Range Status   Specimen Description BLOOD LEFT ANTECUBITAL  Final   Special Requests   Final    BOTTLES DRAWN AEROBIC AND ANAEROBIC Blood Culture adequate volume   Culture   Final    NO GROWTH 5 DAYS Performed at [facility], [address] [place], [place] [address]    Report Status 02/07/2019 FINAL  Final  SARS Coronavirus 2 by RT PCR (hospital order, performed in [place] hospital lab) Nasopharyngeal Nasopharyngeal Swab     Status: Abnormal   Collection Time: 02/02/19 12:47 PM   Specimen: Nasopharyngeal Swab  Result Value Ref Range Status   SARS Coronavirus 2 POSITIVE (A) NEGATIVE Final    Comment: RESULT CALLED TO, READ BACK BY AND VERIFIED WITH:  AMBER PAYNE AT 1093, 02/02/2019 SDR (NOTE) If result is NEGATIVE SARS-CoV-2 target nucleic acids are NOT DETECTED. The SARS-CoV-2 RNA is generally detectable in upper and lower  respiratory specimens during the acute phase of infection. The lowest  concentration  of SARS-CoV-2 viral copies this assay can detect is 250  copies / mL. A negative result does not preclude SARS-CoV-2 infection  and should not be used as the sole basis for treatment or other  patient management decisions.  A negative result may occur with  improper specimen collection / handling, submission of specimen other  than nasopharyngeal swab, presence of viral mutation(s) within the  areas targeted by this assay, and inadequate number of viral  copies  (<250 copies / mL). A negative result must be combined with clinical  observations, patient history, and epidemiological information. If result is POSITIVE SARS-CoV-2 target nucleic acids are DETECTED.  The SARS-CoV-2 RNA is generally detectable in upper and lower  respiratory specimens during the acute phase of infection.  Positive  results are indicative of active infection with SARS-CoV-2.  Clinical  correlation with patient history and other diagnostic information is  necessary to determine patient infection status.  Positive results do  not rule out bacterial infection or co-infection with other viruses. If result is PRESUMPTIVE POSTIVE SARS-CoV-2 nucleic acids MAY BE PRESENT.   A presumptive positive result was obtained on the submitted specimen  and confirmed on repeat testing.  While 2019 novel coronavirus  (SARS-CoV-2) nucleic acids may be present in the submitted sample  additional confirmatory testing may be necessary for epidemiological  and / or clinical management purposes  to differentiate between  SARS-CoV-2 and other Sarbecovirus currently known to infect humans.  If clinically indicated additional testing with an alternate test  methodology [phone]) i s advised. The SARS-CoV-2 RNA is generally  detectable in upper and lower respiratory specimens during the acute  phase of infection. The expected result is Negative. Fact Sheet for Patients:  StrictlyIdeas.no Fact Sheet for Healthcare  Providers: BankingDealers.co.za This test is not yet approved or cleared by the [place] FDA and has been authorized for detection and/or diagnosis of SARS-CoV-2 by FDA under an Emergency Use Authorization (EUA).  This EUA will remain in effect (meaning this test can be used) for the duration of the COVID-19 declaration under Section 564(b)(1) of the Act, 21 U.S.C. section 360bbb-3(b)(1), unless the authorization is terminated or revoked sooner. Performed at [facility], [address] [place], [place] [address]   Blood culture (routine x 2)     Status: None   Collection Time: 02/02/19  1:04 PM   Specimen: BLOOD  Result Value Ref Range Status   Specimen Description BLOOD RIGHT [facility]  Final   Special Requests   Final    BOTTLES DRAWN AEROBIC AND ANAEROBIC Blood Culture adequate volume   Culture   Final    NO GROWTH 5 DAYS Performed at [facility], [address], [place], [place] [address]    Report Status 02/07/2019 FINAL  Final  Urine culture     Status: Abnormal   Collection Time: 02/02/19  1:43 PM   Specimen: Urine, Clean Catch  Result Value Ref Range Status   Specimen Description   Final    URINE, CLEAN CATCH Performed at [facility], [address] [place], [place] [address]    Special Requests   Final    NONE Performed at [facility], [place], [place] [address]    Culture >=100,000 COLONIES/mL KLEBSIELLA PNEUMONIAE (A)  Final   Report Status 02/04/2019 FINAL  Final   Organism ID, Bacteria KLEBSIELLA PNEUMONIAE (A)  Final      Susceptibility   Klebsiella pneumoniae - MIC*    AMPICILLIN RESISTANT Resistant     CEFAZOLIN <=4 SENSITIVE Sensitive     CEFTRIAXONE <=1 SENSITIVE Sensitive     CIPROFLOXACIN <=0.25 SENSITIVE Sensitive     GENTAMICIN <=1 SENSITIVE Sensitive     IMIPENEM <=0.25 SENSITIVE Sensitive     NITROFURANTOIN 128 RESISTANT Resistant     TRIMETH/SULFA <=20 SENSITIVE  Sensitive     AMPICILLIN/SULBACTAM 4 SENSITIVE Sensitive     PIP/TAZO <=4 SENSITIVE Sensitive  Extended ESBL NEGATIVE Sensitive     * >=100,000 COLONIES/mL KLEBSIELLA PNEUMONIAE  MRSA PCR Screening     Status: None   Collection Time: 02/03/19 12:24 AM   Specimen: Nasal Mucosa; Nasopharyngeal  Result Value Ref Range Status   MRSA by PCR NEGATIVE NEGATIVE Final    Comment:        The GeneXpert MRSA Assay (FDA approved for NASAL specimens only), is one component of a comprehensive MRSA colonization surveillance program. It is not intended to diagnose MRSA infection nor to guide or monitor treatment for MRSA infections. Performed at [facility], [address], [place],  [address]          Radiology Studies: Dg Chest [facility] 1 View  Result Date: 02/06/2019 CLINICAL DATA:  Acute respiratory failure with hypoxemia. EXAM: PORTABLE CHEST 1 VIEW COMPARISON:  02/05/2019 FINDINGS: Normal sized heart. Marked diffuse prominence of the interstitial markings and associated patchy densities with mild improvement. Stable left PICC with its tip approximately 1.5 cm superior to the superior cavoatrial junction. Right shoulder fixation hardware. IMPRESSION: Mildly improved bilateral pneumonia and interstitial disease. Electronically Signed   By: Claudie Revering M.D.   On: 02/06/2019 07:10        Scheduled Meds: . Chlorhexidine Gluconate Cloth  6 each Topical Daily  . colchicine  0.6 mg Oral BID  . enoxaparin (LOVENOX) injection  40 mg Subcutaneous Q12H  . insulin aspart  0-20 Units Subcutaneous TID WC  . insulin aspart  0-5 Units Subcutaneous QHS  . insulin aspart  20 Units Subcutaneous TID WC  . insulin glargine  20 Units Subcutaneous q morning - 10a  . insulin glargine  30 Units Subcutaneous QHS  . Ipratropium-Albuterol  1 puff Inhalation BID  . linagliptin  5 mg Oral Daily  . mouth rinse  15 mL Mouth Rinse BID  . methylPREDNISolone sodium succinate  40 mg Intravenous  Q12H  . mometasone-formoterol  2 puff Inhalation BID  . pantoprazole  40 mg Oral Daily  . sodium chloride flush  10-40 mL Intracatheter Q12H  . ascorbic acid  1,000 mg Oral Daily  . zinc sulfate  220 mg Oral Daily   Continuous Infusions:   LOS: 4 days   The patient is critically ill with multiple organ systems failure and requires high complexity decision making for assessment and support, frequent evaluation and titration of therapies, application of advanced monitoring technologies and extensive interpretation of multiple databases. Critical Care Time devoted to patient care services described in this note  Time spent: 40 minutes     , Geraldo Docker, MD [facility] Pager [phone]  If 7PM-7AM, please contact night-coverage [email or web address] Password [email or web address] 02/07/2019, 9:14 AM

## 2019-02-07 NOTE — Progress Notes (Signed)
   02/07/19 0900  Vitals  Pulse Rate (!) 127  Pulse Rate Source Monitor  Resp (!) 24  Oxygen Therapy  SpO2 90 %  O2 Device HFNC  O2 Flow Rate (L/min) 12 L/min  Patient Activity (if Appropriate) In chair  Pulse Oximetry Type Continuous  Oximetry Probe Site Changed No  MEWS Score  MEWS RR 1  MEWS Pulse 2  MEWS Systolic 0  MEWS LOC 0  MEWS Temp 0  MEWS Score 3  MEWS Score Color Yellow  MEWS Assessment  Is this an acute change? Yes (resting in chair, HR up and RR remain up)  Provider Notification  Provider Name/Title Dr. Dia Crawford  Date Provider Notified 02/07/19  Time Provider Notified [phone]  Notification Type Page  Notification Reason Change in status (o2 up, RR, and HR up, pt not in distress, VSS)  Response See new orders  Date of Provider Response 02/07/19  Time of Provider Response [phone]

## 2019-02-07 NOTE — Progress Notes (Signed)
   02/07/19 1544  Family/Significant Other Communication  Family/Significant Other Update Called;Updated;Other (Comment) (Spouse, Jeffery. )

## 2019-02-08 DIAGNOSIS — F320 Major depressive disorder, single episode, mild: Secondary | ICD-10-CM

## 2019-02-08 LAB — CBC WITH DIFFERENTIAL/PLATELET
Abs Immature Granulocytes: 0.19 10*3/uL — ABNORMAL HIGH (ref 0.00–0.07)
Basophils Absolute: 0 10*3/uL (ref 0.0–0.1)
Basophils Relative: 0 %
Eosinophils Absolute: 0 10*3/uL (ref 0.0–0.5)
Eosinophils Relative: 0 %
HCT: 47.3 % — ABNORMAL HIGH (ref 36.0–46.0)
Hemoglobin: 15 g/dL (ref 12.0–15.0)
Immature Granulocytes: 1 %
Lymphocytes Relative: 13 %
Lymphs Abs: 1.7 10*3/uL (ref 0.7–4.0)
MCH: 28.5 pg (ref 26.0–34.0)
MCHC: 31.7 g/dL (ref 30.0–36.0)
MCV: 89.8 fL (ref 80.0–100.0)
Monocytes Absolute: 1 10*3/uL (ref 0.1–1.0)
Monocytes Relative: 8 %
Neutro Abs: 10.4 10*3/uL — ABNORMAL HIGH (ref 1.7–7.7)
Neutrophils Relative %: 78 %
Platelets: 517 10*3/uL — ABNORMAL HIGH (ref 150–400)
RBC: 5.27 MIL/uL — ABNORMAL HIGH (ref 3.87–5.11)
RDW: 14.5 % (ref 11.5–15.5)
WBC: 13.3 10*3/uL — ABNORMAL HIGH (ref 4.0–10.5)
nRBC: 0 % (ref 0.0–0.2)

## 2019-02-08 LAB — COMPREHENSIVE METABOLIC PANEL
ALT: 31 U/L (ref 0–44)
AST: 25 U/L (ref 15–41)
Albumin: 2.8 g/dL — ABNORMAL LOW (ref 3.5–5.0)
Alkaline Phosphatase: 58 U/L (ref 38–126)
Anion gap: 13 (ref 5–15)
BUN: 43 mg/dL — ABNORMAL HIGH (ref 8–23)
CO2: 30 mmol/L (ref 22–32)
Calcium: 9 mg/dL (ref 8.9–10.3)
Chloride: 93 mmol/L — ABNORMAL LOW (ref 98–111)
Creatinine, Ser: 0.82 mg/dL (ref 0.44–1.00)
GFR calc Af Amer: >60 mL/min (ref >60)
GFR calc non Af Amer: >60 mL/min (ref >60)
Glucose, Bld: 217 mg/dL — ABNORMAL HIGH (ref 70–99)
Potassium: 3.7 mmol/L (ref 3.5–5.1)
Sodium: 136 mmol/L (ref 135–145)
Total Bilirubin: 0.6 mg/dL (ref 0.3–1.2)
Total Protein: 7.2 g/dL (ref 6.5–8.1)

## 2019-02-08 LAB — GLUCOSE, CAPILLARY
Glucose-Capillary: 193 mg/dL — ABNORMAL HIGH (ref 70–99)
Glucose-Capillary: 227 mg/dL — ABNORMAL HIGH (ref 70–99)
Glucose-Capillary: 286 mg/dL — ABNORMAL HIGH (ref 70–99)
Glucose-Capillary: 215 mg/dL — ABNORMAL HIGH (ref 70–99)

## 2019-02-08 LAB — C-REACTIVE PROTEIN: CRP: <0.8 mg/dL (ref <1.0)

## 2019-02-08 LAB — PHOSPHORUS: Phosphorus: 6 mg/dL — ABNORMAL HIGH (ref 2.5–4.6)

## 2019-02-08 LAB — MAGNESIUM: Magnesium: 2.3 mg/dL (ref 1.7–2.4)

## 2019-02-08 LAB — D-DIMER, QUANTITATIVE: D-Dimer, Quant: 0.67 ug/mL-FEU — ABNORMAL HIGH (ref 0.00–0.50)

## 2019-02-08 MED ORDER — METOPROLOL TARTRATE 25 MG PO TABS
12.5000 mg | ORAL_TABLET | Freq: Once | ORAL | Status: AC
  Administered 2019-02-08: 12.5 mg via ORAL
  Filled 2019-02-08: qty 1

## 2019-02-08 MED ORDER — METOPROLOL TARTRATE 25 MG PO TABS
25.0000 mg | ORAL_TABLET | Freq: Two times a day (BID) | ORAL | Status: DC
  Administered 2019-02-08 – 2019-02-10 (×4): 25 mg via ORAL
  Filled 2019-02-08 (×3): qty 1

## 2019-02-08 MED ORDER — BUPROPION HCL ER (XL) 300 MG PO TB24
300.0000 mg | ORAL_TABLET | Freq: Every day | ORAL | Status: DC
  Administered 2019-02-08 – 2019-02-10 (×3): 300 mg via ORAL
  Filled 2019-02-08 (×4): qty 1

## 2019-02-08 MED ORDER — INSULIN ASPART 100 UNIT/ML ~~LOC~~ SOLN
28.0000 [IU] | Freq: Three times a day (TID) | SUBCUTANEOUS | Status: DC
  Administered 2019-02-08 – 2019-02-10 (×6): 28 [IU] via SUBCUTANEOUS

## 2019-02-08 NOTE — Plan of Care (Signed)
  Problem: Respiratory: Goal: Will maintain a patent airway Outcome: Not Progressing Variance Did not return to baseline function   Problem: Clinical Measurements: Goal: Respiratory complications will improve Outcome: Not Progressing Variance Did not return to baseline function   Problem: Activity: Goal: Risk for activity intolerance will decrease Outcome: Not Progressing Variance Did not return to baseline function

## 2019-02-08 NOTE — Progress Notes (Signed)
PROGRESS NOTE    Amanda Wiley  JTT:[id number] DOB: 03-24-1956 DOA: 02/03/2019 PCP: Einar Pheasant, MD   Brief Narrative:  63 year old WF PMHx COPD, DM 2, HTN, HLD, hepatomegaly, and obesity   who was originally admitted to [facility] 10/5 > 10/12 for COVID PNA w/ prolonged hypoxic resp failure. She was d/c home newly on O2 at 5LPM. Since returning home, she has not thrived. She remains stable when at rest, but has had signif difficulty w/ dyspnea w/ even the slightest of exertion, not markedly unlike she was experiencing at the time of her d/c. Over the few days prior to her re-admit she began to experience worsening of her DOE. She consulted w/ her PCP, who advised her to seek attention in the [facility] ED.   At the [facility] ED she underwent a CTa of the chest which did not reveal PE, but was remarkable for considerable persisiting B parenchymal change. Given the extent of her dyspnea, and her saturations in the 80s, she was admitted to the [facility] ICU.   Based upon the timing of her prior COVID testing and active sx, it was discussed w/ the team at [facility] that she was no longer an infectious risk in regard to [place] (as defined by ID). Nonetheless, the staff at [facility] felt most comfortable keeping her on isolation. Due to this fact it was felt to be in her best interest to transfer to [facility] to facilitate effective care.    Subjective: 11/1 A/O x4, negative CP, negative abdominal pain, positive S OB (improving), positive productive cough.  Positive depression (attributes to the fact that her Wellbutrin has not been restarted).   Assessment & Plan:   Active Problems:   Obesity, diabetes, and hypertension syndrome (HCC)   Essential (primary) hypertension   Acute respiratory failure due to COVID-19 [facility])   Acute respiratory disease due to COVID-19 virus   Acute respiratory failure with hypoxia (HCC)   COPD (chronic obstructive pulmonary disease) (HCC)   Diabetes mellitus type 2, uncontrolled, with  complications (HCC)   Hepatic steatosis   Respiratory failure with hypoxia recurrent -Combivent QID -Dulera 200-5- BID -Solu-Medrol 40 mg BID -Remdesivir, convalescent plasma negative role at this point her treatment.   COVID-19 Labs  Recent Labs    02/06/19 0502 02/06/19 0503 02/07/19 0230 02/08/19 0445  DDIMER  --  1.25* 0.89* 0.67*  CRP 2.6*  --  1.2* <0.8    Lab Results  Component Value Date   SARSCOV2NAA POSITIVE (A) 02/02/2019   SARSCOV2NAA Detected (A) 01/08/2019  -Per patient did have some fluid retention and had some relief with diuresis. -Echocardiogram pending, will determine if cardiac component -10/31 Lasix IV 40 mg x 6 doses  -Strict in and out -4.0 L -Daily weight Filed Weights   02/06/19 0500 02/07/19 0500 02/08/19 0418  Weight: 94.2 kg 91.8 kg 92.1 kg  -Albuterol  -10/29 echocardiogram normal see results below  COPD -See acute respiratory failure with hypoxia  Diabetes type 2 uncontrolled -10/6 Hemoglobin A1c= 7.3 -10/31 increase Lantus 30 units BID -11/1 increase NovoLog 28 units qac -Resistant SSI  Essential HTN -11/1 increase metoprolol 25 mg BID  Hepatic steatosis/hepatomegaly -Chronic issue  Polycythemia vera -Hemoglobin stable in normal range at this time  Morbid obesity -Body mass index 35.55 kg/m  Polycythemia vera -Hgb stable in a normal range at this time   Hypokalemia -Potassium goal> 4  Hypomagnesmia -Magnesium goal> 2  Depression -Wellbutrin XL 300 mg daily    DVT prophylaxis: Lovenox Code Status:  DNR Family Communication: 11/1 spoke with Dellis Filbert (husband) counseled on plan of care answered all questions Disposition Plan: TBD   Consultants:  PCCM  Procedures/Significant Events:  10/1 first COVID+ test  10/5 admit to [facility] w/ COVID pneumonia  10/12 D/C from [facility] on home 5L O2   10/29 echocardiogram;Left Ventricle: LVEF=60 to 65%.  -Indeterminate diastolic filling due to E-A fusion.      I have  personally reviewed and interpreted all radiology studies and my findings are as above.  VENTILATOR SETTINGS: HFNC Flow rate; 7 L/min FiO2 92%    Cultures   Antimicrobials: Anti-infectives (From admission, onward)   None      Devices    LINES / TUBES:      Continuous Infusions:   Objective: Vitals:   02/08/19 0739 02/08/19 0740 02/08/19 0741 02/08/19 0743  BP:    132/82  Pulse:    (!) 101  Resp:    18  Temp:    98.1 F (36.7 C)  TempSrc:    Oral  SpO2: 98% 97% 99% 96%  Weight:      Height:        Intake/Output Summary (Last 24 hours) at 02/08/2019 0837 Last data filed at 02/08/2019 0740 Gross per 24 hour  Intake 1490 ml  Output 2300 ml  Net -810 ml   Filed Weights   02/06/19 0500 02/07/19 0500 02/08/19 0418  Weight: 94.2 kg 91.8 kg 92.1 kg  Physical Exam:  General: A/O x4, positive acute respiratory distress Eyes: negative scleral hemorrhage, negative anisocoria, negative icterus ENT: Negative Runny nose, negative gingival bleeding, Neck:  Negative scars, masses, torticollis, lymphadenopathy, JVD Lungs: Tachypneic, mild basilar expiratory wheezing, negative crackles Cardiovascular: Tachycardic, without murmur gallop or rub normal S1 and S2 Abdomen: MORBIDLY OBESE, negative abdominal pain, nondistended, positive soft, bowel sounds, no rebound, no ascites, no appreciable mass Extremities: No significant cyanosis, clubbing, or edema bilateral lower extremities Skin: Negative rashes, lesions, ulcers Psychiatric: Positive depression, negative anxiety, negative fatigue, negative mania  Central nervous system:  Cranial nerves II through XII intact, tongue/uvula midline, all extremities muscle strength 5/5, sensation intact throughout,  negative dysarthria, negative expressive aphasia, negative receptive aphasia.  Data Reviewed: Care during the described time interval was provided by me .  I have reviewed this patient's available data, including medical  history, events of note, physical examination, and all test results as part of my evaluation.   CBC: Recent Labs  Lab 02/04/19 0630 02/05/19 0506 02/06/19 0503 02/07/19 0230 02/08/19 0445  WBC 7.9 9.2 10.7* 9.4 13.3*  NEUTROABS 6.2 7.9* 8.3* 7.7 10.4*  HGB 14.1 13.7 14.5 14.7 15.0  HCT 45.8 44.1 45.7 46.8* 47.3*  MCV 91.8 91.5 90.0 89.0 89.8  PLT 252 398 445* 442* 450*   Basic Metabolic Panel: Recent Labs  Lab 02/04/19 1823 02/05/19 0506 02/06/19 0503 02/07/19 0230 02/08/19 0445  NA 136 134* 135 135 136  K 4.5 4.7 3.7 4.4 3.7  CL 93* 92* 88* 92* 93*  CO2 27 29 32 31 30  GLUCOSE 430* 447* 324* 367* 217*  BUN 26* 35* 43* 46* 43*  CREATININE 0.87 0.94 0.75 0.77 0.82  CALCIUM 8.6* 8.6* 8.7* 9.1 9.0  MG 2.0 2.2 2.3 2.6* 2.3  PHOS 2.5 4.3 4.7* 4.7* 6.0*   GFR: Estimated Creatinine Clearance: 78.2 mL/min (by C-G formula based on SCr of 0.82 mg/dL). Liver Function Tests: Recent Labs  Lab 02/04/19 1823 02/05/19 0506 02/06/19 0503 02/07/19 0230 02/08/19 0445  AST 25 23 24  26 25  ALT 22 22 26 28 31   ALKPHOS 77 74 69 62 58  BILITOT 0.7 0.5 0.8 0.4 0.6  PROT 7.5 7.1 7.3 7.1 7.2  ALBUMIN 2.4* 2.5* 2.7* 2.7* 2.8*   No results for input(s): LIPASE, AMYLASE in the last 168 hours. No results for input(s): AMMONIA in the last 168 hours. Coagulation Profile: Recent Labs  Lab 02/02/19 1246  INR 1.1   Cardiac Enzymes: No results for input(s): CKTOTAL, CKMB, CKMBINDEX, TROPONINI in the last 168 hours. BNP (last 3 results) No results for input(s): PROBNP in the last 8760 hours. HbA1C: No results for input(s): HGBA1C in the last 72 hours. CBG: Recent Labs  Lab 02/06/19 1806 02/07/19 1237 02/07/19 1627 02/07/19 2027 02/08/19 0741  GLUCAP 257* 328* 290* 120* 193*   Lipid Profile: No results for input(s): CHOL, HDL, LDLCALC, TRIG, CHOLHDL, LDLDIRECT in the last 72 hours. Thyroid Function Tests: No results for input(s): TSH, T4TOTAL, FREET4, T3FREE, THYROIDAB in the  last 72 hours. Anemia Panel: No results for input(s): VITAMINB12, FOLATE, FERRITIN, TIBC, IRON, RETICCTPCT in the last 72 hours. Urine analysis:    Component Value Date/Time   COLORURINE STRAW (A) 02/02/2019 1343   APPEARANCEUR CLEAR (A) 02/02/2019 1343   LABSPEC 1.013 02/02/2019 1343   PHURINE 5.0 02/02/2019 1343   GLUCOSEU >=500 (A) 02/02/2019 1343   GLUCOSEU 100 (A) 07/16/2013 1446   HGBUR NEGATIVE 02/02/2019 1343   BILIRUBINUR NEGATIVE 02/02/2019 1343   BILIRUBINUR neg 07/06/2015 1150   KETONESUR NEGATIVE 02/02/2019 1343   PROTEINUR NEGATIVE 02/02/2019 1343   UROBILINOGEN 0.2 07/06/2015 1150   UROBILINOGEN 0.2 07/16/2013 1446   NITRITE NEGATIVE 02/02/2019 1343   LEUKOCYTESUR NEGATIVE 02/02/2019 1343   Sepsis Labs: @LABRCNTIP (procalcitonin:4,lacticidven:4)  ) Recent Results (from the past 240 hour(s))  Blood culture (routine x 2)     Status: None   Collection Time: 02/02/19 12:46 PM   Specimen: BLOOD  Result Value Ref Range Status   Specimen Description BLOOD LEFT ANTECUBITAL  Final   Special Requests   Final    BOTTLES DRAWN AEROBIC AND ANAEROBIC Blood Culture adequate volume   Culture   Final    NO GROWTH 5 DAYS Performed at [facility], [address] [place], [place] [address]    Report Status 02/07/2019 FINAL  Final  SARS Coronavirus 2 by RT PCR (hospital order, performed in [place] hospital lab) Nasopharyngeal Nasopharyngeal Swab     Status: Abnormal   Collection Time: 02/02/19 12:47 PM   Specimen: Nasopharyngeal Swab  Result Value Ref Range Status   SARS Coronavirus 2 POSITIVE (A) NEGATIVE Final    Comment: RESULT CALLED TO, READ BACK BY AND VERIFIED WITH:  AMBER PAYNE AT 6160, 02/02/2019 SDR (NOTE) If result is NEGATIVE SARS-CoV-2 target nucleic acids are NOT DETECTED. The SARS-CoV-2 RNA is generally detectable in upper and lower  respiratory specimens during the acute phase of infection. The lowest  concentration of SARS-CoV-2 viral  copies this assay can detect is 250  copies / mL. A negative result does not preclude SARS-CoV-2 infection  and should not be used as the sole basis for treatment or other  patient management decisions.  A negative result may occur with  improper specimen collection / handling, submission of specimen other  than nasopharyngeal swab, presence of viral mutation(s) within the  areas targeted by this assay, and inadequate number of viral copies  (<250 copies / mL). A negative result must be combined with clinical  observations, patient history, and  epidemiological information. If result is POSITIVE SARS-CoV-2 target nucleic acids are DETECTED.  The SARS-CoV-2 RNA is generally detectable in upper and lower  respiratory specimens during the acute phase of infection.  Positive  results are indicative of active infection with SARS-CoV-2.  Clinical  correlation with patient history and other diagnostic information is  necessary to determine patient infection status.  Positive results do  not rule out bacterial infection or co-infection with other viruses. If result is PRESUMPTIVE POSTIVE SARS-CoV-2 nucleic acids MAY BE PRESENT.   A presumptive positive result was obtained on the submitted specimen  and confirmed on repeat testing.  While 2019 novel coronavirus  (SARS-CoV-2) nucleic acids may be present in the submitted sample  additional confirmatory testing may be necessary for epidemiological  and / or clinical management purposes  to differentiate between  SARS-CoV-2 and other Sarbecovirus currently known to infect humans.  If clinically indicated additional testing with an alternate test  methodology [phone]) i s advised. The SARS-CoV-2 RNA is generally  detectable in upper and lower respiratory specimens during the acute  phase of infection. The expected result is Negative. Fact Sheet for Patients:  StrictlyIdeas.no Fact Sheet for Healthcare Providers:  BankingDealers.co.za This test is not yet approved or cleared by the [place] FDA and has been authorized for detection and/or diagnosis of SARS-CoV-2 by FDA under an Emergency Use Authorization (EUA).  This EUA will remain in effect (meaning this test can be used) for the duration of the COVID-19 declaration under Section 564(b)(1) of the Act, 21 U.S.C. section 360bbb-3(b)(1), unless the authorization is terminated or revoked sooner. Performed at [facility], [address] [place], [place] [address]   Blood culture (routine x 2)     Status: None   Collection Time: 02/02/19  1:04 PM   Specimen: BLOOD  Result Value Ref Range Status   Specimen Description BLOOD RIGHT [facility]  Final   Special Requests   Final    BOTTLES DRAWN AEROBIC AND ANAEROBIC Blood Culture adequate volume   Culture   Final    NO GROWTH 5 DAYS Performed at [facility], [address] [place], [place] [address]    Report Status 02/07/2019 FINAL  Final  Urine culture     Status: Abnormal   Collection Time: 02/02/19  1:43 PM   Specimen: Urine, Clean Catch  Result Value Ref Range Status   Specimen Description   Final    URINE, CLEAN CATCH Performed at [facility], [address], [place], [place] [address]    Special Requests   Final    NONE Performed at [facility], [place], [place] [address]    Culture >=100,000 COLONIES/mL KLEBSIELLA PNEUMONIAE (A)  Final   Report Status 02/04/2019 FINAL  Final   Organism ID, Bacteria KLEBSIELLA PNEUMONIAE (A)  Final      Susceptibility   Klebsiella pneumoniae - MIC*    AMPICILLIN RESISTANT Resistant     CEFAZOLIN <=4 SENSITIVE Sensitive     CEFTRIAXONE <=1 SENSITIVE Sensitive     CIPROFLOXACIN <=0.25 SENSITIVE Sensitive     GENTAMICIN <=1 SENSITIVE Sensitive     IMIPENEM <=0.25 SENSITIVE Sensitive     NITROFURANTOIN 128 RESISTANT Resistant     TRIMETH/SULFA <=20 SENSITIVE Sensitive      AMPICILLIN/SULBACTAM 4 SENSITIVE Sensitive     PIP/TAZO <=4 SENSITIVE Sensitive     Extended ESBL NEGATIVE Sensitive     * >=100,000 COLONIES/mL KLEBSIELLA PNEUMONIAE  MRSA PCR Screening  Status: None   Collection Time: 02/03/19 12:24 AM   Specimen: Nasal Mucosa; Nasopharyngeal  Result Value Ref Range Status   MRSA by PCR NEGATIVE NEGATIVE Final    Comment:        The GeneXpert MRSA Assay (FDA approved for NASAL specimens only), is one component of a comprehensive MRSA colonization surveillance program. It is not intended to diagnose MRSA infection nor to guide or monitor treatment for MRSA infections. Performed at [facility], [address] [place], [place] [address]          Radiology Studies: Dg Chest Wheeler 1 View  Result Date: 02/07/2019 CLINICAL DATA:  Shortness of breath EXAM: PORTABLE CHEST 1 VIEW COMPARISON:  02/06/2019 FINDINGS: No significant interval change in AP portable examination with diffuse, somewhat bibasilar predominant interstitial pulmonary opacity. No new airspace opacity. Heart and mediastinum are unremarkable. Left upper extremity PICC remains in unchanged position. IMPRESSION: 1. No significant interval change in AP portable examination with diffuse, somewhat bibasilar predominant interstitial pulmonary opacity. Findings remain consistent with edema or infection, possibly with chronic underlying interstitial change. No new airspace opacity. 2.  Left upper extremity PICC remains in unchanged position. Electronically Signed   By: Eddie Candle M.D.   On: 02/07/2019 11:32        Scheduled Meds: . Chlorhexidine Gluconate Cloth  6 each Topical Daily  . colchicine  0.6 mg Oral BID  . enoxaparin (LOVENOX) injection  40 mg Subcutaneous Q12H  . furosemide  40 mg Intravenous BID  . insulin aspart  0-20 Units Subcutaneous TID WC  . insulin aspart  0-5 Units Subcutaneous QHS  . insulin aspart  20 Units Subcutaneous TID WC  . insulin glargine   30 Units Subcutaneous BID  . Ipratropium-Albuterol  1 puff Inhalation BID  . linagliptin  5 mg Oral Daily  . mouth rinse  15 mL Mouth Rinse BID  . methylPREDNISolone sodium succinate  40 mg Intravenous Q12H  . metoprolol tartrate  12.5 mg Oral BID  . mometasone-formoterol  2 puff Inhalation BID  . pantoprazole  40 mg Oral Daily  . sodium chloride flush  10-40 mL Intracatheter Q12H  . ascorbic acid  1,000 mg Oral Daily  . zinc sulfate  220 mg Oral Daily   Continuous Infusions:   LOS: 5 days   The patient is critically ill with multiple organ systems failure and requires high complexity decision making for assessment and support, frequent evaluation and titration of therapies, application of advanced monitoring technologies and extensive interpretation of multiple databases. Critical Care Time devoted to patient care services described in this note  Time spent: 40 minutes     , Geraldo Docker, MD [facility] Pager [phone]  If 7PM-7AM, please contact night-coverage [email or web address] Password [facility] 02/08/2019, 8:37 AM

## 2019-02-08 NOTE — Progress Notes (Signed)
   02/08/19 1418  Family/Significant Other Communication  Family/Significant Other Update Called (No answer. CB number left on VM)

## 2019-02-09 LAB — GLUCOSE, CAPILLARY
Glucose-Capillary: 281 mg/dL — ABNORMAL HIGH (ref 70–99)
Glucose-Capillary: 370 mg/dL — ABNORMAL HIGH (ref 70–99)
Glucose-Capillary: 99 mg/dL (ref 70–99)
Glucose-Capillary: 140 mg/dL — ABNORMAL HIGH (ref 70–99)
Glucose-Capillary: 157 mg/dL — ABNORMAL HIGH (ref 70–99)
Glucose-Capillary: 180 mg/dL — ABNORMAL HIGH (ref 70–99)

## 2019-02-09 LAB — CBC WITH DIFFERENTIAL/PLATELET
Abs Immature Granulocytes: 0.19 10*3/uL — ABNORMAL HIGH (ref 0.00–0.07)
Basophils Absolute: 0 10*3/uL (ref 0.0–0.1)
Basophils Relative: 0 %
Eosinophils Absolute: 0 10*3/uL (ref 0.0–0.5)
Eosinophils Relative: 0 %
HCT: 47.4 % — ABNORMAL HIGH (ref 36.0–46.0)
Hemoglobin: 14.9 g/dL (ref 12.0–15.0)
Immature Granulocytes: 2 %
Lymphocytes Relative: 12 %
Lymphs Abs: 1.6 10*3/uL (ref 0.7–4.0)
MCH: 28.5 pg (ref 26.0–34.0)
MCHC: 31.4 g/dL (ref 30.0–36.0)
MCV: 90.6 fL (ref 80.0–100.0)
Monocytes Absolute: 1.1 10*3/uL — ABNORMAL HIGH (ref 0.1–1.0)
Monocytes Relative: 9 %
Neutro Abs: 10.2 10*3/uL — ABNORMAL HIGH (ref 1.7–7.7)
Neutrophils Relative %: 77 %
Platelets: 473 10*3/uL — ABNORMAL HIGH (ref 150–400)
RBC: 5.23 MIL/uL — ABNORMAL HIGH (ref 3.87–5.11)
RDW: 14.6 % (ref 11.5–15.5)
WBC: 13.1 10*3/uL — ABNORMAL HIGH (ref 4.0–10.5)
nRBC: 0 % (ref 0.0–0.2)

## 2019-02-09 LAB — COMPREHENSIVE METABOLIC PANEL
ALT: 32 U/L (ref 0–44)
AST: 24 U/L (ref 15–41)
Albumin: 3.1 g/dL — ABNORMAL LOW (ref 3.5–5.0)
Alkaline Phosphatase: 52 U/L (ref 38–126)
Anion gap: 15 (ref 5–15)
BUN: 43 mg/dL — ABNORMAL HIGH (ref 8–23)
CO2: 31 mmol/L (ref 22–32)
Calcium: 9 mg/dL (ref 8.9–10.3)
Chloride: 93 mmol/L — ABNORMAL LOW (ref 98–111)
Creatinine, Ser: 0.81 mg/dL (ref 0.44–1.00)
GFR calc Af Amer: >60 mL/min (ref >60)
GFR calc non Af Amer: >60 mL/min (ref >60)
Glucose, Bld: 71 mg/dL (ref 70–99)
Potassium: 3.1 mmol/L — ABNORMAL LOW (ref 3.5–5.1)
Sodium: 139 mmol/L (ref 135–145)
Total Bilirubin: 0.5 mg/dL (ref 0.3–1.2)
Total Protein: 6.7 g/dL (ref 6.5–8.1)

## 2019-02-09 LAB — MAGNESIUM: Magnesium: 2.5 mg/dL — ABNORMAL HIGH (ref 1.7–2.4)

## 2019-02-09 LAB — PHOSPHORUS: Phosphorus: 5.3 mg/dL — ABNORMAL HIGH (ref 2.5–4.6)

## 2019-02-09 LAB — C-REACTIVE PROTEIN: CRP: <0.8 mg/dL (ref <1.0)

## 2019-02-09 LAB — FERRITIN: Ferritin: 152 ng/mL (ref 11–307)

## 2019-02-09 LAB — D-DIMER, QUANTITATIVE: D-Dimer, Quant: 0.66 ug/mL-FEU — ABNORMAL HIGH (ref 0.00–0.50)

## 2019-02-09 LAB — LACTATE DEHYDROGENASE: LDH: 219 U/L — ABNORMAL HIGH (ref 98–192)

## 2019-02-09 MED ORDER — POTASSIUM CHLORIDE CRYS ER 20 MEQ PO TBCR
50.0000 meq | EXTENDED_RELEASE_TABLET | Freq: Two times a day (BID) | ORAL | Status: AC
  Administered 2019-02-09 (×2): 50 meq via ORAL
  Filled 2019-02-09 (×2): qty 3

## 2019-02-09 MED ORDER — POTASSIUM CHLORIDE CRYS ER 20 MEQ PO TBCR
50.0000 meq | EXTENDED_RELEASE_TABLET | Freq: Once | ORAL | Status: AC
  Administered 2019-02-09: 50 meq via ORAL
  Filled 2019-02-09: qty 3

## 2019-02-09 NOTE — Progress Notes (Signed)
PROGRESS NOTE    Amanda Wiley  TIW:[id number] DOB: 01-Nov-1955 DOA: 02/03/2019 PCP: Einar Pheasant, MD   Brief Narrative:  63 year old WF PMHx COPD, DM 2, HTN, HLD, hepatomegaly, and obesity   who was originally admitted to [facility] 10/5 > 10/12 for COVID PNA w/ prolonged hypoxic resp failure. She was d/c home newly on O2 at 5LPM. Since returning home, she has not thrived. She remains stable when at rest, but has had signif difficulty w/ dyspnea w/ even the slightest of exertion, not markedly unlike she was experiencing at the time of her d/c. Over the few days prior to her re-admit she began to experience worsening of her DOE. She consulted w/ her PCP, who advised her to seek attention in the [facility] ED.   At the [facility] ED she underwent a CTa of the chest which did not reveal PE, but was remarkable for considerable persisiting B parenchymal change. Given the extent of her dyspnea, and her saturations in the 80s, she was admitted to the [facility] ICU.   Based upon the timing of her prior COVID testing and active sx, it was discussed w/ the team at [facility] that she was no longer an infectious risk in regard to [place] (as defined by ID). Nonetheless, the staff at [facility] felt most comfortable keeping her on isolation. Due to this fact it was felt to be in her best interest to transfer to [facility] to facilitate effective care.    Subjective: 11/2 last 24 hours afebrile, negative CP, positive S OB but significantly improved, continued productive cough.  Depression seems to have resolved with the restarting of her medication.    Assessment & Plan:   Active Problems:   Obesity, diabetes, and hypertension syndrome (HCC)   Essential (primary) hypertension   Acute respiratory failure due to COVID-19 [facility])   Acute respiratory disease due to COVID-19 virus   Acute respiratory failure with hypoxia (HCC)   COPD (chronic obstructive pulmonary disease) (HCC)   Diabetes mellitus type 2, uncontrolled, with complications  (HCC)   Hepatic steatosis   Respiratory failure with hypoxia recurrent -Combivent QID -Dulera 200-5- BID -Solu-Medrol 40 mg BID -Remdesivir, convalescent plasma negative role at this point her treatment.   -11/3 ambulatory SPO2 pending COVID-19 Labs  Recent Labs    02/07/19 0230 02/08/19 0445 02/09/19 0522  DDIMER 0.89* 0.67*  --   LDH  --   --  219*  CRP 1.2* <0.8  --     Lab Results  Component Value Date   SARSCOV2NAA POSITIVE (A) 02/02/2019   SARSCOV2NAA Detected (A) 01/08/2019  -Per patient did have some fluid retention and had some relief with diuresis. -10/31 Lasix IV 40 mg x 6 doses  -Strict in and out -4.3 L -Daily weight Filed Weights   02/07/19 0500 02/08/19 0418 02/09/19 0500  Weight: 91.8 kg 92.1 kg 92.4 kg  -Albuterol  -10/29 echocardiogram normal see results below -11/3 ambulatory SPO2  COPD -See acute respiratory failure with hypoxia  Diabetes type 2 uncontrolled -10/6 Hemoglobin A1c= 7.3 -10/31 increase Lantus 30 units BID -11/1 increase NovoLog 28 units qac -Resistant SSI  Essential HTN -11/1 increase metoprolol 25 mg BID  Hepatic steatosis/hepatomegaly -Chronic issue  Polycythemia vera -Hemoglobin stable in normal range at this time  Morbid obesity -Body mass index 35.55 kg/m  Polycythemia vera -Hgb stable in a normal range at this time   Hypokalemia -Potassium goal> 4 -11/2 K Dur 50 meq x 3 doses  Hypomagnesmia -Magnesium goal> 2  Depression -  Wellbutrin XL 300 mg daily    DVT prophylaxis: Lovenox Code Status: DNR Family Communication: 11/1 spoke with Dellis Filbert (husband) counseled on plan of care answered all questions Disposition Plan: TBD   Consultants:  PCCM  Procedures/Significant Events:  10/1 first COVID+ test  10/5 admit to [facility] w/ COVID pneumonia  10/12 D/C from [facility] on home 5L O2   10/29 echocardiogram;Left Ventricle: LVEF=60 to 65%.  -Indeterminate diastolic filling due to E-A fusion.      I have  personally reviewed and interpreted all radiology studies and my findings are as above.  VENTILATOR SETTINGS: Nasal cannula Flow rate; 4 L/min SPO2 91%    Cultures   Antimicrobials: Anti-infectives (From admission, onward)   None      Devices    LINES / TUBES:      Continuous Infusions:   Objective: Vitals:   02/08/19 1816 02/08/19 1939 02/09/19 0456 02/09/19 0500  BP:  (!) 143/70 139/64   Pulse:  90 75   Resp:  18 19   Temp:  97.8 F (36.6 C) 98 F (36.7 C)   TempSrc:  Oral Oral   SpO2: 95% 93% 91%   Weight:    92.4 kg  Height:        Intake/Output Summary (Last 24 hours) at 02/09/2019 0809 Last data filed at 02/08/2019 2300 Gross per 24 hour  Intake 1660 ml  Output 1100 ml  Net 560 ml   Filed Weights   02/07/19 0500 02/08/19 0418 02/09/19 0500  Weight: 91.8 kg 92.1 kg 92.4 kg    Physical Exam:  General: A/O x4, positive acute respiratory distress Eyes: negative scleral hemorrhage, negative anisocoria, negative icterus ENT: Negative Runny nose, negative gingival bleeding, Neck:  Negative scars, masses, torticollis, lymphadenopathy, JVD Lungs: Clear to auscultation bilaterally without wheezes or crackles Cardiovascular: Regular rate and rhythm without murmur gallop or rub normal S1 and S2 Abdomen: negative abdominal pain, nondistended, positive soft, bowel sounds, no rebound, no ascites, no appreciable mass Extremities: No significant cyanosis, clubbing, or edema bilateral lower extremities Skin: Negative rashes, lesions, ulcers Psychiatric:  Negative depression, negative anxiety, negative fatigue, negative mania  Central nervous system:  Cranial nerves II through XII intact, tongue/uvula midline, all extremities muscle strength 5/5, sensation intact throughout, finger nose finger bilateral within normal limits, quick finger touch bilateral within normal limits, negative Romberg sign, heel to shin bilateral within normal limits, standing on 1 foot  bilateral within normal limits, walking on tiptoes within normal limits, walking on heels within normal limits, negative dysarthria, negative expressive aphasia, negative receptive aphasia.     Data Reviewed: Care during the described time interval was provided by me .  I have reviewed this patient's available data, including medical history, events of note, physical examination, and all test results as part of my evaluation.   CBC: Recent Labs  Lab 02/04/19 0630 02/05/19 0506 02/06/19 0503 02/07/19 0230 02/08/19 0445  WBC 7.9 9.2 10.7* 9.4 13.3*  NEUTROABS 6.2 7.9* 8.3* 7.7 10.4*  HGB 14.1 13.7 14.5 14.7 15.0  HCT 45.8 44.1 45.7 46.8* 47.3*  MCV 91.8 91.5 90.0 89.0 89.8  PLT 252 398 445* 442* 921*   Basic Metabolic Panel: Recent Labs  Lab 02/05/19 0506 02/06/19 0503 02/07/19 0230 02/08/19 0445 02/09/19 0522  NA 134* 135 135 136 139  K 4.7 3.7 4.4 3.7 3.1*  CL 92* 88* 92* 93* 93*  CO2 29 32 31 30 31   GLUCOSE 447* 324* 367* 217* 71  BUN 35* 43* 46*  43* 43*  CREATININE 0.94 0.75 0.77 0.82 0.81  CALCIUM 8.6* 8.7* 9.1 9.0 9.0  MG 2.2 2.3 2.6* 2.3 2.5*  PHOS 4.3 4.7* 4.7* 6.0* 5.3*   GFR: Estimated Creatinine Clearance: 79.3 mL/min (by C-G formula based on SCr of 0.81 mg/dL). Liver Function Tests: Recent Labs  Lab 02/05/19 0506 02/06/19 0503 02/07/19 0230 02/08/19 0445 02/09/19 0522  AST 23 24 26 25 24   ALT 22 26 28 31  32  ALKPHOS 74 69 62 58 52  BILITOT 0.5 0.8 0.4 0.6 0.5  PROT 7.1 7.3 7.1 7.2 6.7  ALBUMIN 2.5* 2.7* 2.7* 2.8* 3.1*   No results for input(s): LIPASE, AMYLASE in the last 168 hours. No results for input(s): AMMONIA in the last 168 hours. Coagulation Profile: Recent Labs  Lab 02/02/19 1246  INR 1.1   Cardiac Enzymes: No results for input(s): CKTOTAL, CKMB, CKMBINDEX, TROPONINI in the last 168 hours. BNP (last 3 results) No results for input(s): PROBNP in the last 8760 hours. HbA1C: No results for input(s): HGBA1C in the last 72  hours. CBG: Recent Labs  Lab 02/07/19 2027 02/08/19 0741 02/08/19 1133 02/08/19 1621 02/08/19 1945  GLUCAP 120* 193* 227* 286* 215*   Lipid Profile: No results for input(s): CHOL, HDL, LDLCALC, TRIG, CHOLHDL, LDLDIRECT in the last 72 hours. Thyroid Function Tests: No results for input(s): TSH, T4TOTAL, FREET4, T3FREE, THYROIDAB in the last 72 hours. Anemia Panel: No results for input(s): VITAMINB12, FOLATE, FERRITIN, TIBC, IRON, RETICCTPCT in the last 72 hours. Urine analysis:    Component Value Date/Time   COLORURINE STRAW (A) 02/02/2019 1343   APPEARANCEUR CLEAR (A) 02/02/2019 1343   LABSPEC 1.013 02/02/2019 1343   PHURINE 5.0 02/02/2019 1343   GLUCOSEU >=500 (A) 02/02/2019 1343   GLUCOSEU 100 (A) 07/16/2013 1446   HGBUR NEGATIVE 02/02/2019 1343   BILIRUBINUR NEGATIVE 02/02/2019 1343   BILIRUBINUR neg 07/06/2015 1150   KETONESUR NEGATIVE 02/02/2019 1343   PROTEINUR NEGATIVE 02/02/2019 1343   UROBILINOGEN 0.2 07/06/2015 1150   UROBILINOGEN 0.2 07/16/2013 1446   NITRITE NEGATIVE 02/02/2019 1343   LEUKOCYTESUR NEGATIVE 02/02/2019 1343   Sepsis Labs: @LABRCNTIP (procalcitonin:4,lacticidven:4)  ) Recent Results (from the past 240 hour(s))  Blood culture (routine x 2)     Status: None   Collection Time: 02/02/19 12:46 PM   Specimen: BLOOD  Result Value Ref Range Status   Specimen Description BLOOD LEFT ANTECUBITAL  Final   Special Requests   Final    BOTTLES DRAWN AEROBIC AND ANAEROBIC Blood Culture adequate volume   Culture   Final    NO GROWTH 5 DAYS Performed at [facility], [address] [place], [place] [address]    Report Status 02/07/2019 FINAL  Final  SARS Coronavirus 2 by RT PCR (hospital order, performed in [place] hospital lab) Nasopharyngeal Nasopharyngeal Swab     Status: Abnormal   Collection Time: 02/02/19 12:47 PM   Specimen: Nasopharyngeal Swab  Result Value Ref Range Status   SARS Coronavirus 2 POSITIVE (A) NEGATIVE Final     Comment: RESULT CALLED TO, READ BACK BY AND VERIFIED WITH:  AMBER PAYNE AT 9326, 02/02/2019 SDR (NOTE) If result is NEGATIVE SARS-CoV-2 target nucleic acids are NOT DETECTED. The SARS-CoV-2 RNA is generally detectable in upper and lower  respiratory specimens during the acute phase of infection. The lowest  concentration of SARS-CoV-2 viral copies this assay can detect is 250  copies / mL. A negative result does not preclude SARS-CoV-2 infection  and should not be used  as the sole basis for treatment or other  patient management decisions.  A negative result may occur with  improper specimen collection / handling, submission of specimen other  than nasopharyngeal swab, presence of viral mutation(s) within the  areas targeted by this assay, and inadequate number of viral copies  (<250 copies / mL). A negative result must be combined with clinical  observations, patient history, and epidemiological information. If result is POSITIVE SARS-CoV-2 target nucleic acids are DETECTED.  The SARS-CoV-2 RNA is generally detectable in upper and lower  respiratory specimens during the acute phase of infection.  Positive  results are indicative of active infection with SARS-CoV-2.  Clinical  correlation with patient history and other diagnostic information is  necessary to determine patient infection status.  Positive results do  not rule out bacterial infection or co-infection with other viruses. If result is PRESUMPTIVE POSTIVE SARS-CoV-2 nucleic acids MAY BE PRESENT.   A presumptive positive result was obtained on the submitted specimen  and confirmed on repeat testing.  While 2019 novel coronavirus  (SARS-CoV-2) nucleic acids may be present in the submitted sample  additional confirmatory testing may be necessary for epidemiological  and / or clinical management purposes  to differentiate between  SARS-CoV-2 and other Sarbecovirus currently known to infect humans.  If clinically indicated  additional testing with an alternate test  methodology [phone]) i s advised. The SARS-CoV-2 RNA is generally  detectable in upper and lower respiratory specimens during the acute  phase of infection. The expected result is Negative. Fact Sheet for Patients:  StrictlyIdeas.no Fact Sheet for Healthcare Providers: BankingDealers.co.za This test is not yet approved or cleared by the [place] FDA and has been authorized for detection and/or diagnosis of SARS-CoV-2 by FDA under an Emergency Use Authorization (EUA).  This EUA will remain in effect (meaning this test can be used) for the duration of the COVID-19 declaration under Section 564(b)(1) of the Act, 21 U.S.C. section 360bbb-3(b)(1), unless the authorization is terminated or revoked sooner. Performed at [facility], [address] [place], [place] [address]   Blood culture (routine x 2)     Status: None   Collection Time: 02/02/19  1:04 PM   Specimen: BLOOD  Result Value Ref Range Status   Specimen Description BLOOD RIGHT [facility]  Final   Special Requests   Final    BOTTLES DRAWN AEROBIC AND ANAEROBIC Blood Culture adequate volume   Culture   Final    NO GROWTH 5 DAYS Performed at [facility], [address], [place], [place] [address]    Report Status 02/07/2019 FINAL  Final  Urine culture     Status: Abnormal   Collection Time: 02/02/19  1:43 PM   Specimen: Urine, Clean Catch  Result Value Ref Range Status   Specimen Description   Final    URINE, CLEAN CATCH Performed at [facility] [place], [address], [place], [place] [address]    Special Requests   Final    NONE Performed at [facility], [address], [place], [place] [address]    Culture >=100,000 COLONIES/mL KLEBSIELLA PNEUMONIAE (A)  Final   Report Status 02/04/2019 FINAL  Final   Organism ID, Bacteria KLEBSIELLA PNEUMONIAE (A)  Final      Susceptibility   Klebsiella  pneumoniae - MIC*    AMPICILLIN RESISTANT Resistant     CEFAZOLIN <=4 SENSITIVE Sensitive     CEFTRIAXONE <=1 SENSITIVE Sensitive     CIPROFLOXACIN <=0.25 SENSITIVE Sensitive  GENTAMICIN <=1 SENSITIVE Sensitive     IMIPENEM <=0.25 SENSITIVE Sensitive     NITROFURANTOIN 128 RESISTANT Resistant     TRIMETH/SULFA <=20 SENSITIVE Sensitive     AMPICILLIN/SULBACTAM 4 SENSITIVE Sensitive     PIP/TAZO <=4 SENSITIVE Sensitive     Extended ESBL NEGATIVE Sensitive     * >=100,000 COLONIES/mL KLEBSIELLA PNEUMONIAE  MRSA PCR Screening     Status: None   Collection Time: 02/03/19 12:24 AM   Specimen: Nasal Mucosa; Nasopharyngeal  Result Value Ref Range Status   MRSA by PCR NEGATIVE NEGATIVE Final    Comment:        The GeneXpert MRSA Assay (FDA approved for NASAL specimens only), is one component of a comprehensive MRSA colonization surveillance program. It is not intended to diagnose MRSA infection nor to guide or monitor treatment for MRSA infections. Performed at [facility], [address] [place], [place] [address]          Radiology Studies: Dg Chest Calera 1 View  Result Date: 02/07/2019 CLINICAL DATA:  Shortness of breath EXAM: PORTABLE CHEST 1 VIEW COMPARISON:  02/06/2019 FINDINGS: No significant interval change in AP portable examination with diffuse, somewhat bibasilar predominant interstitial pulmonary opacity. No new airspace opacity. Heart and mediastinum are unremarkable. Left upper extremity PICC remains in unchanged position. IMPRESSION: 1. No significant interval change in AP portable examination with diffuse, somewhat bibasilar predominant interstitial pulmonary opacity. Findings remain consistent with edema or infection, possibly with chronic underlying interstitial change. No new airspace opacity. 2.  Left upper extremity PICC remains in unchanged position. Electronically Signed   By: Eddie Candle M.D.   On: 02/07/2019 11:32        Scheduled Meds:   buPROPion  300 mg Oral Daily   Chlorhexidine Gluconate Cloth  6 each Topical Daily   colchicine  0.6 mg Oral BID   enoxaparin (LOVENOX) injection  40 mg Subcutaneous Q12H   furosemide  40 mg Intravenous BID   insulin aspart  0-20 Units Subcutaneous TID WC   insulin aspart  0-5 Units Subcutaneous QHS   insulin aspart  28 Units Subcutaneous TID WC   insulin glargine  30 Units Subcutaneous BID   Ipratropium-Albuterol  1 puff Inhalation BID   linagliptin  5 mg Oral Daily   mouth rinse  15 mL Mouth Rinse BID   methylPREDNISolone sodium succinate  40 mg Intravenous Q12H   metoprolol tartrate  25 mg Oral BID   mometasone-formoterol  2 puff Inhalation BID   pantoprazole  40 mg Oral Daily   sodium chloride flush  10-40 mL Intracatheter Q12H   ascorbic acid  1,000 mg Oral Daily   zinc sulfate  220 mg Oral Daily   Continuous Infusions:   LOS: 6 days   The patient is critically ill with multiple organ systems failure and requires high complexity decision making for assessment and support, frequent evaluation and titration of therapies, application of advanced monitoring technologies and extensive interpretation of multiple databases. Critical Care Time devoted to patient care services described in this note  Time spent: 40 minutes     Blair Mesina, Geraldo Docker, MD [facility] Pager [phone]  If 7PM-7AM, please contact night-coverage [email or web address] Password [email or web address] 02/09/2019, 8:09 AM

## 2019-02-09 NOTE — Plan of Care (Signed)
  Problem: Activity: Goal: Risk for activity intolerance will decrease Outcome: Not Progressing  Continues to desat with activity.

## 2019-02-09 NOTE — Progress Notes (Signed)
   02/09/19 1315  Family/Significant Other Communication  Family/Significant Other Update Called;Updated (Spoke with husand Jacqulynn Cadet)

## 2019-02-09 NOTE — Plan of Care (Signed)

## 2019-02-10 ENCOUNTER — Telehealth: Payer: Self-pay | Admitting: Internal Medicine

## 2019-02-10 LAB — PHOSPHORUS: Phosphorus: 3.3 mg/dL (ref 2.5–4.6)

## 2019-02-10 LAB — C-REACTIVE PROTEIN: CRP: <0.8 mg/dL (ref <1.0)

## 2019-02-10 LAB — COMPREHENSIVE METABOLIC PANEL
ALT: 28 U/L (ref 0–44)
AST: 18 U/L (ref 15–41)
Albumin: 2.8 g/dL — ABNORMAL LOW (ref 3.5–5.0)
Alkaline Phosphatase: 51 U/L (ref 38–126)
Anion gap: 12 (ref 5–15)
BUN: 34 mg/dL — ABNORMAL HIGH (ref 8–23)
CO2: 27 mmol/L (ref 22–32)
Calcium: 8.7 mg/dL — ABNORMAL LOW (ref 8.9–10.3)
Chloride: 97 mmol/L — ABNORMAL LOW (ref 98–111)
Creatinine, Ser: 0.78 mg/dL (ref 0.44–1.00)
GFR calc Af Amer: >60 mL/min (ref >60)
GFR calc non Af Amer: >60 mL/min (ref >60)
Glucose, Bld: 228 mg/dL — ABNORMAL HIGH (ref 70–99)
Potassium: 4.6 mmol/L (ref 3.5–5.1)
Sodium: 136 mmol/L (ref 135–145)
Total Bilirubin: 0.4 mg/dL (ref 0.3–1.2)
Total Protein: 6.4 g/dL — ABNORMAL LOW (ref 6.5–8.1)

## 2019-02-10 LAB — CBC WITH DIFFERENTIAL/PLATELET
Abs Immature Granulocytes: 0.21 10*3/uL — ABNORMAL HIGH (ref 0.00–0.07)
Basophils Absolute: 0 10*3/uL (ref 0.0–0.1)
Basophils Relative: 0 %
Eosinophils Absolute: 0 10*3/uL (ref 0.0–0.5)
Eosinophils Relative: 0 %
HCT: 45.4 % (ref 36.0–46.0)
Hemoglobin: 14.2 g/dL (ref 12.0–15.0)
Immature Granulocytes: 2 %
Lymphocytes Relative: 9 %
Lymphs Abs: 1.1 10*3/uL (ref 0.7–4.0)
MCH: 28.2 pg (ref 26.0–34.0)
MCHC: 31.3 g/dL (ref 30.0–36.0)
MCV: 90.1 fL (ref 80.0–100.0)
Monocytes Absolute: 0.8 10*3/uL (ref 0.1–1.0)
Monocytes Relative: 6 %
Neutro Abs: 10.4 10*3/uL — ABNORMAL HIGH (ref 1.7–7.7)
Neutrophils Relative %: 83 %
Platelets: 459 10*3/uL — ABNORMAL HIGH (ref 150–400)
RBC: 5.04 MIL/uL (ref 3.87–5.11)
RDW: 14.8 % (ref 11.5–15.5)
WBC: 12.6 10*3/uL — ABNORMAL HIGH (ref 4.0–10.5)
nRBC: 0 % (ref 0.0–0.2)

## 2019-02-10 LAB — GLUCOSE, CAPILLARY
Glucose-Capillary: 164 mg/dL — ABNORMAL HIGH (ref 70–99)
Glucose-Capillary: 255 mg/dL — ABNORMAL HIGH (ref 70–99)

## 2019-02-10 LAB — LACTATE DEHYDROGENASE: LDH: 231 U/L — ABNORMAL HIGH (ref 98–192)

## 2019-02-10 LAB — D-DIMER, QUANTITATIVE: D-Dimer, Quant: 0.6 ug/mL-FEU — ABNORMAL HIGH (ref 0.00–0.50)

## 2019-02-10 LAB — MAGNESIUM: Magnesium: 2.4 mg/dL (ref 1.7–2.4)

## 2019-02-10 LAB — FERRITIN: Ferritin: 138 ng/mL (ref 11–307)

## 2019-02-10 MED ORDER — LINAGLIPTIN 5 MG PO TABS: 5.0000 mg | ORAL_TABLET | Freq: Every day | ORAL | 0 refills | Status: DC

## 2019-02-10 MED ORDER — METOPROLOL TARTRATE 25 MG PO TABS: 25.0000 mg | ORAL_TABLET | Freq: Two times a day (BID) | ORAL | 0 refills | Status: DC

## 2019-02-10 MED ORDER — TRAMADOL HCL 50 MG PO TABS: 50.0000 mg | ORAL_TABLET | Freq: Four times a day (QID) | ORAL | 0 refills | Status: DC | PRN

## 2019-02-10 MED ORDER — BUPROPION HCL ER (XL) 300 MG PO TB24: 300.0000 mg | ORAL_TABLET | Freq: Every day | ORAL | 0 refills | Status: DC

## 2019-02-10 NOTE — Progress Notes (Signed)
SATURATION QUALIFICATIONS: (This note is used to comply with regulatory documentation for home oxygen)  Patient Saturations on Room Air at Rest = 90%-92%  Patient Saturations on Room Air while Ambulating = 80-82%  Patient Saturations on 4 Liters of oxygen while Ambulating = 89-92%  Please briefly explain why patient needs home oxygen: Patient needs home oxygen to maintain saturations while ambulating greater than 89%.

## 2019-02-10 NOTE — Progress Notes (Addendum)
0730: Assumed care of Pt from night RN. Pt resting comfortably in chair  0815: Assessed, denies pain, SOB. VSS, SpO2 92% on room air resting. Plan for discharge today. Will monitor  1005: Called husband to give update with no answer. Left message  1200: IV therapy contacted for PICC removal.  1400: Over an hour spent with Pt sorting out discharge education/paperwork/prescriptions. Pt medication list being straightened out with pharmacist and Pt also called PCP because tradjenta is not covered by insurance. Pt making endocrinologist aware of current insulin regimen over phone. RN contacted charge RN to remove PICC.   1600: Pt verified all questions answered regarding discharge instructions including medications. Charge RN removed PICC line with no immediate complications. Pt assisted out of hospital to awaiting son-in-law.

## 2019-02-10 NOTE — Discharge Summary (Addendum)
Physician Discharge Summary  Amanda Wiley DHR:[id number] DOB: January 29, 1956 DOA: 02/03/2019  PCP: Einar Pheasant, MD  Admit date: 02/03/2019 Discharge date: 02/10/2019  Time spent: 30 minutes  Recommendations for Outpatient Follow-up:   Respiratory failure with hypoxia recurrent -Combivent QID -Dulera 200-5- BID -Solu-Medrol 40 mg BID completed course.  Upon further consideration of the length of time patient has been on steroids and how well she is doing WILL NOT discharged on steroids -Remdesivir, convalescent plasma negative role at this point her treatment.  -Follow-up with PCP Dr. Einar Pheasant in 2 weeks  [place] (last 2 labs)         Recent Labs    02/06/19 0502 02/06/19 0503 02/07/19 0230 02/08/19 0445  DDIMER  --  1.25* 0.89* 0.67*  CRP 2.6*  --  1.2* <0.8      Recent Labs       Lab Results  Component Value Date   [place] (A) 02/02/2019   SARSCOV2NAA Detected (A) 01/08/2019    -Per patient did have some fluid retention and had some relief with diuresis. -Echocardiogram pending; WNL, see results below,  -Strict in and out -2.6 L -Daily weight      Filed Weights   02/06/19 0500 02/07/19 0500 02/08/19 0418  Weight: 94.2 kg 91.8 kg 92.1 kg  -Albuterol  -10/29 echocardiogram normal see results below SATURATION QUALIFICATIONS: (This note is used to comply with regulatory documentation for home oxygen) Patient Saturations on Room Air at Rest = 90%-92% Patient Saturations on Room Air while Ambulating = 80-82% Patient Saturations on 4 Liters of oxygen while Ambulating = 89-92% Please briefly explain why patient needs home oxygen: Patient needs home oxygen to maintain saturations while ambulating greater than 89%. -4 L O2 via [place] with exertion and when sleeping.  Titrate to maintain SPO2> 88% -Provide [employer] portable oxygen concentrator   COPD -See acute respiratory failure with hypoxia  Diabetes type 2  uncontrolled -10/6 Hemoglobin A1c= 7.3 -Restart home insulin regimen   Essential HTN -Metoprolol 25 mg BID  Hepatic steatosis/hepatomegaly -Chronic issue  Polycythemia vera -Hemoglobin stable in normal range at this time  Morbid obesity -Body mass index 35.55 kg/m  Polycythemia vera -Hgb stable in a normal range at this time  Hypokalemia -Potassium goal> 4  Hypomagnesmia -Magnesium goal> 2  Depression -Wellbutrin XL 300 mg daily   Discharge Diagnoses:  Active Problems:   Obesity, diabetes, and hypertension syndrome (HCC)   Essential (primary) hypertension   Acute respiratory failure due to COVID-19 [facility])   Acute respiratory disease due to COVID-19 virus   Acute respiratory failure with hypoxia (HCC)   COPD (chronic obstructive pulmonary disease) (HCC)   Diabetes mellitus type 2, uncontrolled, with complications ([place])   Hepatic steatosis   Discharge Condition: Stable  Diet recommendation: Carb modified  Filed Weights   02/08/19 0418 02/09/19 0500 02/10/19 0344  Weight: 92.1 kg 92.4 kg 92.5 kg    History of present illness:  63 year old WF PMHx COPD, DM 2, HTN, HLD, hepatomegaly, and obesity   who was originally admitted to [facility] 10/5 > 10/12 for COVID PNA w/ prolonged hypoxic resp failure. She was d/c home newly on O2at 5LPM. Since returning home, she has not thrived. She remains stable when at rest, but has had signif difficulty w/ dyspnea w/ even the slightest of exertion, not markedly unlike she was experiencing at the time of her d/c. Over the few days prior to her re-admit she began to experience worsening of  her DOE. She consulted w/ her PCP, who advised her to seek attention in the [facility] ED.   At the [facility] ED she underwent a CTa of the chest which did not reveal PE, but was remarkable for considerable persisiting B parenchymal change. Given the extent of her dyspnea, and her saturations in the 80s, she was admitted to the [facility] ICU.   Based upon  the timing of her prior COVID testing and active sx, it was discussed w/ the team at [facility] that she was no longer an infectious risk in regard to [employer] (as defined by ID). Nonetheless, the staff at [facility] felt most comfortable keeping her on isolation. Due to this fact it was felt to be in her best interest to transfer to [facility] to facilitate effective care.   Hospital Course:  During his hospitalization patient was treated for post Covid acute respiratory distress with hypoxia, patient responded well to treatment and is stable for discharge.     Procedures: 10/1 first COVID+ test  10/5 admit to [facility] w/ COVID pneumonia  10/12 D/C from [facility] on home 5L O2  10/29 echocardiogram;Left Ventricle: LVEF=60 to 65%.  -Indeterminate diastolic filling due to E-A fusion.  Ventilator settings Room air SPO2; 90%   Consultations: PCCM  Cultures   10/26 SARS coronavirus positive   Antibiotics Anti-infectives (From admission, onward)   None       Discharge Exam: Vitals:   02/10/19 0000 02/10/19 0344 02/10/19 0345 02/10/19 0733  BP: (!) 108/59  (!) 108/54 126/64  Pulse: 61  66 70  Resp: 20  15 20   Temp: 98.9 F (37.2 C)  98.3 F (36.8 C) 97.8 F (36.6 C)  TempSrc: Oral  Oral Oral  SpO2: 90%  (!) 89% 90%  Weight:  92.5 kg    Height:        General: A/O x4, positive acute respiratory distress Eyes: negative scleral hemorrhage, negative anisocoria, negative icterus ENT: Negative Runny nose, negative gingival bleeding, Neck:  Negative scars, masses, torticollis, lymphadenopathy, JVD Lungs:  Clear to auscultation bilaterally, mild basilar expiratory wheezing, negative crackles Cardiovascular:  Regular rhythm and rate, without murmur gallop or rub normal S1 and S2  Discharge Instructions   Allergies as of 02/10/2019      Reactions   Avelox [moxifloxacin Hcl In Nacl] Hives, Shortness Of Breath, Other (See Comments)   Moxifloxacin Hives, Shortness Of Breath   Allegra [fexofenadine  Hcl] Other (See Comments)   headaches   Ibuprofen Swelling   Sulfa Antibiotics Other (See Comments)   Difficulty breathing, rash, tongue swelling   Tegretol [carbamazepine] Other (See Comments)   Other reaction(s): Other (See Comments) Vomiting Vomiting   Iodine Rash   Ioxaglate Rash   Ivp Dye [iodinated Diagnostic Agents] Rash      Medication List    STOP taking these medications   ceFEPIme 2 g in sodium chloride 0.9 % 100 mL   chlorhexidine 0.12 % solution Commonly known as: PERIDEX   enoxaparin 100 MG/ML injection Commonly known as: LOVENOX   furosemide 10 MG/ML injection Commonly known as: LASIX   methylPREDNISolone sodium succinate 40 mg/mL injection Commonly known as: SOLU-MEDROL     TAKE these medications   acetaminophen 325 MG tablet Commonly known as: TYLENOL Take 2 tablets (650 mg total) by mouth every 6 (six) hours as needed for mild pain (or Fever >/= 101).   albuterol 108 (90 Base) MCG/ACT inhaler Commonly known as: VENTOLIN HFA Inhale 1-2 puffs into the lungs every 4 (four)  hours as needed for wheezing or shortness of breath.   ascorbic acid 1000 MG tablet Commonly known as: VITAMIN C Take 1 tablet (1,000 mg total) by mouth daily.   buPROPion 300 MG 24 hr tablet Commonly known as: WELLBUTRIN XL Take 1 tablet (300 mg total) by mouth daily. Start taking on: February 11, 2019   colchicine 0.6 MG tablet Take 1 tablet (0.6 mg total) by mouth 2 (two) times daily.   insulin aspart 100 UNIT/ML injection Commonly known as: novoLOG Inject 0-9 Units into the skin 3 (three) times daily with meals.   insulin aspart 100 UNIT/ML injection Commonly known as: novoLOG Inject 0-5 Units into the skin at bedtime.   Ipratropium-Albuterol 20-100 MCG/ACT Aers respimat Commonly known as: COMBIVENT Inhale 4 puffs into the lungs every 4 (four) hours while awake.   linagliptin 5 MG Tabs tablet Commonly known as: TRADJENTA Take 1 tablet (5 mg total) by mouth  daily. Start taking on: February 11, 2019   metoprolol tartrate 25 MG tablet Commonly known as: LOPRESSOR Take 1 tablet (25 mg total) by mouth 2 (two) times daily.   mometasone-formoterol 200-5 MCG/ACT Aero Commonly known as: DULERA Inhale 2 puffs into the lungs 2 (two) times daily.   ondansetron 4 MG tablet Commonly known as: ZOFRAN Take 1 tablet (4 mg total) by mouth every 6 (six) hours as needed for nausea.   pantoprazole 40 MG tablet Commonly known as: PROTONIX Take 1 tablet (40 mg total) by mouth daily.   potassium chloride SA 20 MEQ tablet Commonly known as: KLOR-CON Take 2 tablets (40 mEq total) by mouth 2 (two) times daily.   traMADol 50 MG tablet Commonly known as: ULTRAM Take 1 tablet (50 mg total) by mouth every 6 (six) hours as needed for up to 3 days for moderate pain.   zinc sulfate 220 (50 Zn) MG capsule Take 1 capsule (220 mg total) by mouth daily.      Allergies  Allergen Reactions  . Avelox [Moxifloxacin Hcl In Nacl] Hives, Shortness Of Breath and Other (See Comments)  . Moxifloxacin Hives and Shortness Of Breath  . Allegra [Fexofenadine Hcl] Other (See Comments)    headaches  . Ibuprofen Swelling  . Sulfa Antibiotics Other (See Comments)    Difficulty breathing, rash, tongue swelling  . Tegretol [Carbamazepine] Other (See Comments)    Other reaction(s): Other (See Comments) Vomiting Vomiting   . Iodine Rash  . Ioxaglate Rash  . Ivp Dye [Iodinated Diagnostic Agents] Rash      The results of significant diagnostics from this hospitalization (including imaging, microbiology, ancillary and laboratory) are listed below for reference.    Significant Diagnostic Studies: Dg Chest 1 View  Result Date: 02/02/2019 CLINICAL DATA:  Shortness of breath. EXAM: CHEST  1 VIEW COMPARISON:  January 18, 2019. FINDINGS: Stable cardiomediastinal silhouette. Increased diffuse airspace opacities are noted bilaterally consistent with multifocal pneumonia. No  pneumothorax or pleural effusion is noted. Status post right proximal humerus surgical internal fixation. IMPRESSION: Significantly increased bilateral lung opacities are noted consistent with worsening multifocal pneumonia. Electronically Signed   By: Marijo Conception M.D.   On: 02/02/2019 13:47   Ct Angio Chest Pe W And/or Wo Contrast  Result Date: 02/02/2019 CLINICAL DATA:  Complex chest pain EXAM: CT ANGIOGRAPHY CHEST WITH CONTRAST TECHNIQUE: Multidetector CT imaging of the chest was performed using the standard protocol during bolus administration of intravenous contrast. Multiplanar CT image reconstructions and MIPs were obtained to evaluate the vascular anatomy. CONTRAST:  68m OMNIPAQUE IOHEXOL 350 MG/ML SOLN COMPARISON:  None. FINDINGS: Cardiovascular: Contrast injection is sufficient to demonstrate satisfactory opacification of the pulmonary arteries to the segmental level. There is no pulmonary embolus. The main pulmonary artery is within normal limits for size. There is no CT evidence of acute right heart strain. The aorta demonstrates extensive vascular calcifications. Heart size is normal, without pericardial effusion. Mediastinum/Nodes: --No mediastinal or hilar lymphadenopathy. --No axillary lymphadenopathy. --No supraclavicular lymphadenopathy. --bilateral thyroid nodules are noted. --The esophagus is unremarkable Lungs/Pleura: There are extensive ground-glass bilateral airspace opacities involving all lobes. There are small bilateral pleural effusions, right greater than left. There is no pneumothorax. There is no discrete pulmonary nodule, however evaluation is limited by extensive ground-glass airspace opacities. There is some debris within the trachea. Upper Abdomen: No acute abnormality. Musculoskeletal: No chest wall abnormality. No acute or significant osseous findings. Review of the MIP images confirms the above findings. IMPRESSION: 1. No evidence for pulmonary embolus. 2. Extensive  ground-glass airspace opacities involving all lobes of both lungs, with small bilateral pleural effusions, right greater than left. These findings are consistent with the patient's history of viral pneumonia. Pulmonary edema can have a similar appearance. Aortic Atherosclerosis (ICD10-I70.0). Electronically Signed   By: CConstance HolsterM.D.   On: 02/02/2019 17:43   Dg Chest Port 1 View  Result Date: 02/07/2019 CLINICAL DATA:  Shortness of breath EXAM: PORTABLE CHEST 1 VIEW COMPARISON:  02/06/2019 FINDINGS: No significant interval change in AP portable examination with diffuse, somewhat bibasilar predominant interstitial pulmonary opacity. No new airspace opacity. Heart and mediastinum are unremarkable. Left upper extremity PICC remains in unchanged position. IMPRESSION: 1. No significant interval change in AP portable examination with diffuse, somewhat bibasilar predominant interstitial pulmonary opacity. Findings remain consistent with edema or infection, possibly with chronic underlying interstitial change. No new airspace opacity. 2.  Left upper extremity PICC remains in unchanged position. Electronically Signed   By: AEddie CandleM.D.   On: 02/07/2019 11:32   Dg Chest Port 1 View  Result Date: 02/06/2019 CLINICAL DATA:  Acute respiratory failure with hypoxemia. EXAM: PORTABLE CHEST 1 VIEW COMPARISON:  02/05/2019 FINDINGS: Normal sized heart. Marked diffuse prominence of the interstitial markings and associated patchy densities with mild improvement. Stable left PICC with its tip approximately 1.5 cm superior to the superior cavoatrial junction. Right shoulder fixation hardware. IMPRESSION: Mildly improved bilateral pneumonia and interstitial disease. Electronically Signed   By: SClaudie ReveringM.D.   On: 02/06/2019 07:10   Dg Chest Port 1 View  Result Date: 02/05/2019 CLINICAL DATA:  Endotracheal tube. EXAM: PORTABLE CHEST 1 VIEW COMPARISON:  December 03, 2018. FINDINGS: The heart size and  mediastinal contours are within normal limits. No pneumothorax pleural effusion is noted. Stable diffuse bilateral lung opacities are noted. The visualized skeletal structures are unremarkable. IMPRESSION: Stable bilateral lung opacities consistent with multifocal pneumonia. Electronically Signed   By: JMarijo ConceptionM.D.   On: 02/05/2019 08:39   Dg Chest Port 1 View  Result Date: 01/18/2019 CLINICAL DATA:  COVID+ AM Port @ [facility]. COPD, HTN, DM, coughing during procedure. EXAM: PORTABLE CHEST 1 VIEW COMPARISON:  Radiograph 01/15/2019 FINDINGS: Stable cardiac silhouette. Patchy bilateral airspace densities similar comparison exam. Probable atelectasis at lung bases. IMPRESSION: 1. Overall no interval change. 2. Bilateral peripheral airspace densities and basilar atelectasis. Electronically Signed   By: SSuzy BouchardM.D.   On: 01/18/2019 07:13   Dg Chest Port 1 View  Result Date: 01/15/2019 CLINICAL DATA:  63year old female  with history of pneumonia from COVID-19 infection. EXAM: PORTABLE CHEST 1 VIEW COMPARISON:  Chest x-ray 01/12/2019. FINDINGS: Multifocal patchy airspace consolidation and interstitial opacities are again noted throughout the lungs bilaterally, with increasing confluence in the periphery of the mid to lower lungs bilaterally, compatible with worsening pneumonia. Probable small right pleural effusion. Pulmonary vasculature is obscured. Heart size appears mildly enlarged. Upper mediastinal contours are within normal limits. Status post ORIF in the right proximal humerus. IMPRESSION: 1. Progressive multilobar bilateral pneumonia, as above. 2. Small right pleural effusion. Electronically Signed   By: Vinnie Langton M.D.   On: 01/15/2019 08:48   Dg Chest Port 1 View  Result Date: 01/12/2019 CLINICAL DATA:  COVID-19 positive with shortness of breath and fever. EXAM: PORTABLE CHEST 1 VIEW COMPARISON:  10/13/2016 FINDINGS: The cardiac silhouette, mediastinal and hilar contours are within  normal limits and stable. The lungs demonstrate underlying emphysematous changes. Low lung volumes with vascular crowding. Streaky bibasilar infiltrates but no effusions or edema. The bony thorax is intact. Stable right shoulder hardware. IMPRESSION: Underlying emphysema and low lung volumes. Patchy basilar infiltrates. Electronically Signed   By: Marijo Sanes M.D.   On: 01/12/2019 13:41   [place] Ekg Site Rite  Result Date: 02/04/2019 If Site Rite image not attached, placement could not be confirmed due to current cardiac rhythm.   Microbiology: Recent Results (from the past 240 hour(s))  Blood culture (routine x 2)     Status: None   Collection Time: 02/02/19 12:46 PM   Specimen: BLOOD  Result Value Ref Range Status   Specimen Description BLOOD LEFT ANTECUBITAL  Final   Special Requests   Final    BOTTLES DRAWN AEROBIC AND ANAEROBIC Blood Culture adequate volume   Culture   Final    NO GROWTH 5 DAYS Performed at [facility], [address] [place], [place] [address]    Report Status 02/07/2019 FINAL  Final  SARS Coronavirus 2 by RT PCR (hospital order, performed in [place] hospital lab) Nasopharyngeal Nasopharyngeal Swab     Status: Abnormal   Collection Time: 02/02/19 12:47 PM   Specimen: Nasopharyngeal Swab  Result Value Ref Range Status   SARS Coronavirus 2 POSITIVE (A) NEGATIVE Final    Comment: RESULT CALLED TO, READ BACK BY AND VERIFIED WITH:  AMBER PAYNE AT 2694, 02/02/2019 SDR (NOTE) If result is NEGATIVE SARS-CoV-2 target nucleic acids are NOT DETECTED. The SARS-CoV-2 RNA is generally detectable in upper and lower  respiratory specimens during the acute phase of infection. The lowest  concentration of SARS-CoV-2 viral copies this assay can detect is 250  copies / mL. A negative result does not preclude SARS-CoV-2 infection  and should not be used as the sole basis for treatment or other  patient management decisions.  A negative result may occur with   improper specimen collection / handling, submission of specimen other  than nasopharyngeal swab, presence of viral mutation(s) within the  areas targeted by this assay, and inadequate number of viral copies  (<250 copies / mL). A negative result must be combined with clinical  observations, patient history, and epidemiological information. If result is POSITIVE SARS-CoV-2 target nucleic acids are DETECTED.  The SARS-CoV-2 RNA is generally detectable in upper and lower  respiratory specimens during the acute phase of infection.  Positive  results are indicative of active infection with SARS-CoV-2.  Clinical  correlation with patient history and other diagnostic information is  necessary to determine patient infection status.  Positive results do  not rule out bacterial infection or co-infection with other viruses. If result is PRESUMPTIVE POSTIVE SARS-CoV-2 nucleic acids MAY BE PRESENT.   A presumptive positive result was obtained on the submitted specimen  and confirmed on repeat testing.  While 2019 novel coronavirus  (SARS-CoV-2) nucleic acids may be present in the submitted sample  additional confirmatory testing may be necessary for epidemiological  and / or clinical management purposes  to differentiate between  SARS-CoV-2 and other Sarbecovirus currently known to infect humans.  If clinically indicated additional testing with an alternate test  methodology [phone]) i s advised. The SARS-CoV-2 RNA is generally  detectable in upper and lower respiratory specimens during the acute  phase of infection. The expected result is Negative. Fact Sheet for Patients:  StrictlyIdeas.no Fact Sheet for Healthcare Providers: BankingDealers.co.za This test is not yet approved or cleared by the [place] FDA and has been authorized for detection and/or diagnosis of SARS-CoV-2 by FDA under an Emergency Use Authorization (EUA).  This EUA will  remain in effect (meaning this test can be used) for the duration of the COVID-19 declaration under Section 564(b)(1) of the Act, 21 U.S.C. section 360bbb-3(b)(1), unless the authorization is terminated or revoked sooner. Performed at [facility], [address] [place], [place] [address]   Blood culture (routine x 2)     Status: None   Collection Time: 02/02/19  1:04 PM   Specimen: BLOOD  Result Value Ref Range Status   Specimen Description BLOOD RIGHT [facility]  Final   Special Requests   Final    BOTTLES DRAWN AEROBIC AND ANAEROBIC Blood Culture adequate volume   Culture   Final    NO GROWTH 5 DAYS Performed at [facility], [address] [place], [place] [address]    Report Status 02/07/2019 FINAL  Final  Urine culture     Status: Abnormal   Collection Time: 02/02/19  1:43 PM   Specimen: Urine, Clean Catch  Result Value Ref Range Status   Specimen Description   Final    URINE, CLEAN CATCH Performed at [facility], [address] [place], [place] [address]    Special Requests   Final    NONE Performed at [facility], [address], [place],  [address]    Culture >=100,000 COLONIES/mL KLEBSIELLA PNEUMONIAE (A)  Final   Report Status 02/04/2019 FINAL  Final   Organism ID, Bacteria KLEBSIELLA PNEUMONIAE (A)  Final      Susceptibility   Klebsiella pneumoniae - MIC*    AMPICILLIN RESISTANT Resistant     CEFAZOLIN <=4 SENSITIVE Sensitive     CEFTRIAXONE <=1 SENSITIVE Sensitive     CIPROFLOXACIN <=0.25 SENSITIVE Sensitive     GENTAMICIN <=1 SENSITIVE Sensitive     IMIPENEM <=0.25 SENSITIVE Sensitive     NITROFURANTOIN 128 RESISTANT Resistant     TRIMETH/SULFA <=20 SENSITIVE Sensitive     AMPICILLIN/SULBACTAM 4 SENSITIVE Sensitive     PIP/TAZO <=4 SENSITIVE Sensitive     Extended ESBL NEGATIVE Sensitive     * >=100,000 COLONIES/mL KLEBSIELLA PNEUMONIAE  MRSA PCR Screening     Status: None   Collection Time: 02/03/19 12:24 AM    Specimen: Nasal Mucosa; Nasopharyngeal  Result Value Ref Range Status   MRSA by PCR NEGATIVE NEGATIVE Final    Comment:        The GeneXpert MRSA Assay (FDA approved for NASAL specimens only), is one component of a comprehensive MRSA colonization surveillance program. It is not intended  to diagnose MRSA infection nor to guide or monitor treatment for MRSA infections. Performed at [facility], [address] [place], [place] [address]      Labs: Basic Metabolic Panel: Recent Labs  Lab 02/06/19 0503 02/07/19 0230 02/08/19 0445 02/09/19 0522 02/10/19 0507  NA 135 135 136 139 136  K 3.7 4.4 3.7 3.1* 4.6  CL 88* 92* 93* 93* 97*  CO2 32 31 30 31 27   GLUCOSE 324* 367* 217* 71 228*  BUN 43* 46* 43* 43* 34*  CREATININE 0.75 0.77 0.82 0.81 0.78  CALCIUM 8.7* 9.1 9.0 9.0 8.7*  MG 2.3 2.6* 2.3 2.5* 2.4  PHOS 4.7* 4.7* 6.0* 5.3* 3.3   Liver Function Tests: Recent Labs  Lab 02/06/19 0503 02/07/19 0230 02/08/19 0445 02/09/19 0522 02/10/19 0507  AST 24 26 25 24 18   ALT 26 28 31  32 28  ALKPHOS 69 62 58 52 51  BILITOT 0.8 0.4 0.6 0.5 0.4  PROT 7.3 7.1 7.2 6.7 6.4*  ALBUMIN 2.7* 2.7* 2.8* 3.1* 2.8*   No results for input(s): LIPASE, AMYLASE in the last 168 hours. No results for input(s): AMMONIA in the last 168 hours. CBC: Recent Labs  Lab 02/06/19 0503 02/07/19 0230 02/08/19 0445 02/09/19 0500 02/10/19 0507  WBC 10.7* 9.4 13.3* 13.1* 12.6*  NEUTROABS 8.3* 7.7 10.4* 10.2* 10.4*  HGB 14.5 14.7 15.0 14.9 14.2  HCT 45.7 46.8* 47.3* 47.4* 45.4  MCV 90.0 89.0 89.8 90.6 90.1  PLT 445* 442* 517* 473* 459*   Cardiac Enzymes: No results for input(s): CKTOTAL, CKMB, CKMBINDEX, TROPONINI in the last 168 hours. BNP: BNP (last 3 results) Recent Labs    02/02/19 1247  BNP 471.0*    ProBNP (last 3 results) No results for input(s): PROBNP in the last 8760 hours.  CBG: Recent Labs  Lab 02/09/19 0846 02/09/19 1233 02/09/19 1551 02/09/19 2039  02/10/19 0732  GLUCAP 99 140* 157* 180* 164*       Signed:  Dia Crawford, MD [facility] [phone] pager

## 2019-02-10 NOTE — TOC Transition Note (Addendum)
Transition of Care [facility]) - CM/SW Discharge Note   Patient Details  Name: Amanda Wiley MRN: [id number] Date of Birth: Oct 06, 1955  Transition of Care [facility]) CM/SW Contact:  Weston Anna, LCSW Phone Number: 02/10/2019, 12:23 PM   Clinical Narrative:     Patient set to discharge home today- DME orders placed by MD for o2. O2 will be provided by [facility]- portable o2 tank provided by [facility] for transportation. No other needs at this time.   Final next level of care: Home/Self Care Barriers to Discharge: No Barriers Identified   Patient Goals and CMS Choice        Discharge Placement                       Discharge Plan and Services                DME Arranged: Oxygen DME Agency: [employer] Date DME Agency Contacted: 02/10/19 Time DME Agency Contacted: 1223 Representative spoke with at DME Agency: [place]: NA          Social Determinants of Health ([place]) Interventions     Readmission Risk Interventions No flowsheet data found.

## 2019-02-10 NOTE — Telephone Encounter (Addendum)
[facility] was prescribed tradjente or switch to [place]. The tradjenta needs PA. pharm [facility] in [place]. Pt would like dr Nicki Reaper to know she make it

## 2019-02-10 NOTE — Plan of Care (Signed)
Pt discharged, goals met

## 2019-02-11 ENCOUNTER — Telehealth: Payer: Self-pay

## 2019-02-11 NOTE — Telephone Encounter (Signed)
Pt is followed by Dr Gabriel Carina for diabetic treatment. I have spoke with patient to let her know that she should reach out to their office regarding this medication. I called and spoke with endocrinology making sure that there wasn't anything we need to do. Receptionist sending message over to check with Dr Gabriel Carina. Callback number given in case they need to speak with me. I have called patient but was unable to leave a message giving her update

## 2019-02-11 NOTE — Telephone Encounter (Signed)
First attempt to reach patient for transitional care management. No answer. Unable to leave message. Will follow as appropriate.

## 2019-02-12 NOTE — Telephone Encounter (Signed)
Transition Care Management Follow-up Telephone Call   Date discharged? 02/10/19   How have you been since you were released from the hospital? Patient states, "My legs are weak but I am doing squats for that." Denies falls. Oxygen in use prn, has not yet needed. Oxygen on room air @ 92%. Notes she is feeling "great".    Do you understand why you were in the hospital? Yes, acute respiratory failure.   Do you understand the discharge instructions? Yes, get up and move around every hour, deep breathing exercises every 30-60 minutes.    Where were you discharged to? Home.    Items Reviewed:  Medications reviewed: Yes, medication changes per her endocrinologist to start Jardiance, stop Tradjenta. Take 20U long acting lantus once daily. Hospital added combivent and dulera inhalers at discharge. Taking other scheduled medications as directed.   Allergies reviewed: Yes, none new.  Dietary changes reviewed: Yes, heart healthy/diabetic.  Referrals reviewed: She plans to consult new pulmonary physician. Endocrinology scheduled 03/02/19.   Functional Questionnaire:   Activities of Daily Living (ADLs):   She states they are independent in the following: Self feeds, grooming.  States they require assistance with the following: Bathing, dressing, meal prep, husband assist. BSC in use to shorten walking distance to restroom, ambulates indoors by holding onto the walls. I encouraged the use of walker or cane for stability.    Any transportation issues/concerns?: None at this time.    Any patient concerns? Yes, questions if she is to restart singulair, lasix and amlodipine. Notes she stopped taking lasix upon recent discharge. Stopped singulair and amlodipine about 30 days ago. Reports blood pressure readings have been WNL and denies headaches, chest pain and all other symptoms. I encouraged spot checking BP prior to hfu appointment.    Confirmed importance and date/time of follow-up visits  scheduled Yes, virtual doxy scheduled on 02/13/2019 @ 4:00, [phone].  Provider Appointment booked with Dr. Nicki Reaper, pcp.  Confirmed with patient if condition begins to worsen call PCP or go to the ER.  Patient was given the office number and encouraged to call back with question or concerns.  :Yes.

## 2019-02-13 ENCOUNTER — Other Ambulatory Visit: Payer: Self-pay

## 2019-02-13 ENCOUNTER — Other Ambulatory Visit: Payer: Self-pay | Admitting: Internal Medicine

## 2019-02-13 ENCOUNTER — Ambulatory Visit (INDEPENDENT_AMBULATORY_CARE_PROVIDER_SITE_OTHER): Admitting: Internal Medicine

## 2019-02-13 DIAGNOSIS — J9621 Acute and chronic respiratory failure with hypoxia: Secondary | ICD-10-CM | POA: Diagnosis not present

## 2019-02-13 DIAGNOSIS — J4520 Mild intermittent asthma, uncomplicated: Secondary | ICD-10-CM

## 2019-02-13 DIAGNOSIS — E1165 Type 2 diabetes mellitus with hyperglycemia: Secondary | ICD-10-CM

## 2019-02-13 DIAGNOSIS — R7989 Other specified abnormal findings of blood chemistry: Secondary | ICD-10-CM

## 2019-02-13 DIAGNOSIS — R945 Abnormal results of liver function studies: Secondary | ICD-10-CM

## 2019-02-13 DIAGNOSIS — J069 Acute upper respiratory infection, unspecified: Secondary | ICD-10-CM

## 2019-02-13 DIAGNOSIS — I10 Essential (primary) hypertension: Secondary | ICD-10-CM

## 2019-02-13 DIAGNOSIS — U071 COVID-19: Secondary | ICD-10-CM

## 2019-02-13 DIAGNOSIS — K219 Gastro-esophageal reflux disease without esophagitis: Secondary | ICD-10-CM | POA: Diagnosis not present

## 2019-02-13 NOTE — Progress Notes (Signed)
Patient ID: NHYLA Amanda Wiley, female   DOB: [birth year], 63 y.o.   MRN: [id number]   Virtual Visit via video Note  This visit type was conducted due to national recommendations for restrictions regarding the COVID-19 pandemic (e.g. social distancing).  This format is felt to be most appropriate for this patient at this time.  All issues noted in this document were discussed and addressed.  No physical exam was performed (except for noted visual exam findings with Video Visits).   I connected with [name] by a video enabled telemedicine application and verified that I am speaking with the correct person using two identifiers. Location patient: home Location provider: work Persons participating in the virtual visit: patient, provider  I discussed the limitations, risks, security and privacy concerns of performing an evaluation and management service by video and the availability of in person appointments. The patient expressed understanding and agreed to proceed.   Reason for visit: hospital follow up.   HPI: She was hospitalized from 02/03/19 - 02/10/19 with respiratory failure with hypoxia - from covid 19.  She was given solumedrol and started on combivent and dulera.  Was also diuresed while in the hospital. ECHO normal.  She was noted to still have decreased O2 sats while ambulating.  Was discharged home to continue O2 with exertion. Since her discharge home, she does feel better.  Still with decreased energy, but breathing is better.  Appetite improving.  She met with Dr Gabriel Carina yesterday and adjusted her insulin regimen.  She reports no fever.  No chest pain.  Still with cough, but has improved.  Using inhalers.  No abdominal pain.  Bowels stable.  She has not been wearing her oxygen.  Discussed the need to wear her oxygen with ambulation.  Discussed physical therapy.  She declines.  Went over her medication list.  She had questions regarding the medications that were held.  Blood pressure doing  well - off lasix, amlodipine and losartan.  Discussed would need to follow closely and add back as needed.     ROS: See pertinent positives and negatives per HPI.  Past Medical History:  Diagnosis Date   COPD (chronic obstructive pulmonary disease) ([place])    Crohn's disease ([place])    Depression    Diabetes mellitus ([place])    Fracture, foot    post fall   Hypercholesterolemia    Hypertension    IBS (irritable bowel syndrome)    Nephrolithiasis    s/p lithotripsy [facility])    Past Surgical History:  Procedure Laterality Date   ABDOMINAL HYSTERECTOMY  1985   APPENDECTOMY  1985   BREAST EXCISIONAL BIOPSY Right 2013   benign   COLONOSCOPY  2014   LAPAROSCOPIC CHOLECYSTECTOMY  2005   Dr Tamala Julian   LAPAROSCOPIC SUPRACERVICAL HYSTERECTOMY  1985   left ovary not removed   LITHOTRIPSY     x7   OOPHORECTOMY     POLYPECTOMY     SHOULDER SURGERY     right (pinning)   SHOULDER SURGERY     left (pinning)   TONSILLECTOMY  1966    Family History  Problem Relation Age of Onset   Heart disease Father    Emphysema Maternal Grandmother    COPD Mother    Hypercholesterolemia Mother    Emphysema Mother    Hypertension Brother    Emphysema Other        grandmother   COPD Sister    Crohn's disease Sister    Hypertension Sister  Breast cancer Neg Hx    Colon cancer Neg Hx     SOCIAL HX: reviewed.    Current Outpatient Medications:    acetaminophen (TYLENOL) 325 MG tablet, Take 2 tablets (650 mg total) by mouth every 6 (six) hours as needed for mild pain (or Fever >/= 101)., Disp:  , Rfl:    albuterol (VENTOLIN HFA) 108 (90 Base) MCG/ACT inhaler, Inhale 1-2 puffs into the lungs every 4 (four) hours as needed for wheezing or shortness of breath., Disp:  , Rfl:    buPROPion (WELLBUTRIN XL) 300 MG 24 hr tablet, Take 1 tablet (300 mg total) by mouth daily., Disp: 30 tablet, Rfl: 0   insulin aspart (NOVOLOG) 100 UNIT/ML injection,  Inject 0-9 Units into the skin 3 (three) times daily with meals. (Patient not taking: Reported on 02/13/2019), Disp: 10 mL, Rfl: 11   insulin aspart (NOVOLOG) 100 UNIT/ML injection, Inject 0-5 Units into the skin at bedtime. (Patient not taking: Reported on 02/13/2019), Disp: 10 mL, Rfl: 11   Ipratropium-Albuterol (COMBIVENT) 20-100 MCG/ACT AERS respimat, Inhale 2 puffs into the lungs every 4 (four) hours while awake., Disp: 4 g, Rfl: 2   metoprolol tartrate (LOPRESSOR) 25 MG tablet, Take 1 tablet (25 mg total) by mouth 2 (two) times daily., Disp: 50 tablet, Rfl: 0   mometasone-formoterol (DULERA) 200-5 MCG/ACT AERO, Inhale 2 puffs into the lungs 2 (two) times daily., Disp: 13 g, Rfl: 1   potassium chloride SA (KLOR-CON) 20 MEQ tablet, Take 2 tablets (40 mEq total) by mouth 2 (two) times daily., Disp:  , Rfl:    vitamin C (VITAMIN C) 1000 MG tablet, Take 1 tablet (1,000 mg total) by mouth daily., Disp:  , Rfl:    zinc sulfate 220 (50 Zn) MG capsule, Take 1 capsule (220 mg total) by mouth daily., Disp:  , Rfl:   EXAM:  VITALS per patient if applicable: 182/99, 371/69  GENERAL: alert, oriented, appears well and in no acute distress  HEENT: atraumatic, conjunttiva clear, no obvious abnormalities on inspection of external nose and ears  NECK: normal movements of the head and neck  LUNGS: on inspection no signs of respiratory distress, breathing rate appears normal, no obvious gross SOB, gasping or wheezing  CV: no obvious cyanosis  PSYCH/NEURO: pleasant and cooperative, no obvious depression or anxiety, speech and thought processing grossly intact  ASSESSMENT AND PLAN:  Discussed the following assessment and plan:  Abnormal liver function tests Follow liver panel.    Acid reflux Controlled.   Acute on chronic respiratory failure with hypoxia (HCC) On oxygen.  Discussed the need for her to use her oxygen with ambulation.  Breathing overall is better.  Follow closely.    Acute  respiratory disease due to COVID-19 virus Recently admitted as outlined.  Given solumedrol.  Breathing better now. On oxygen.  Needs with ambulation.  Follow.    Asthma Continue inhalers as outlined.  Follow.    Diabetes mellitus ([place]) Just met with Dr Gabriel Carina.  Back on jardiance, metformin and taking lantus 20 units per day.  Follow.    Essential (primary) hypertension Blood pressure as outlined - off amlodipine, lasix and losartan.  She will monitor blood pressure.  Discussed would follow and add back medication as indicated.  Follow metabolic panel.   GERD (gastroesophageal reflux disease) Controlled.     I discussed the assessment and treatment plan with the patient. The patient was provided an opportunity to ask questions and all were answered. The patient agreed with  the plan and demonstrated an understanding of the instructions.   The patient was advised to call back or seek an in-person evaluation if the symptoms worsen or if the condition fails to improve as anticipated.  I provided 25 minutes of non-face-to-face time during this encounter.   Einar Pheasant, MD

## 2019-02-16 ENCOUNTER — Encounter: Payer: Self-pay | Admitting: Internal Medicine

## 2019-02-16 MED ORDER — MOMETASONE FURO-FORMOTEROL FUM 200-5 MCG/ACT IN AERO: 2.0000 | INHALATION_SPRAY | Freq: Two times a day (BID) | RESPIRATORY_TRACT | 1 refills | Status: DC

## 2019-02-16 MED ORDER — IPRATROPIUM-ALBUTEROL 20-100 MCG/ACT IN AERS: 2.0000 | INHALATION_SPRAY | RESPIRATORY_TRACT | 2 refills | Status: AC

## 2019-02-16 NOTE — Assessment & Plan Note (Signed)
Blood pressure as outlined - off amlodipine, lasix and losartan.  She will monitor blood pressure.  Discussed would follow and add back medication as indicated.  Follow metabolic panel.

## 2019-02-16 NOTE — Assessment & Plan Note (Signed)
Controlled.  

## 2019-02-16 NOTE — Assessment & Plan Note (Signed)
On oxygen.  Discussed the need for her to use her oxygen with ambulation.  Breathing overall is better.  Follow closely.

## 2019-02-16 NOTE — Assessment & Plan Note (Signed)
Just met with Dr Gabriel Carina.  Back on jardiance, metformin and taking lantus 20 units per day.  Follow.

## 2019-02-16 NOTE — Assessment & Plan Note (Signed)
Follow liver panel.

## 2019-02-16 NOTE — Assessment & Plan Note (Signed)
Continue inhalers as outlined.  Follow.

## 2019-02-16 NOTE — Assessment & Plan Note (Signed)
Recently admitted as outlined.  Given solumedrol.  Breathing better now. On oxygen.  Needs with ambulation.  Follow.

## 2019-02-23 ENCOUNTER — Ambulatory Visit (INDEPENDENT_AMBULATORY_CARE_PROVIDER_SITE_OTHER): Admitting: Internal Medicine

## 2019-02-23 ENCOUNTER — Other Ambulatory Visit: Payer: Self-pay

## 2019-02-23 VITALS — BP 132/75 | Ht 65.5 in | Wt 195.0 lb

## 2019-02-23 DIAGNOSIS — F329 Major depressive disorder, single episode, unspecified: Secondary | ICD-10-CM

## 2019-02-23 DIAGNOSIS — U071 COVID-19: Secondary | ICD-10-CM | POA: Diagnosis not present

## 2019-02-23 DIAGNOSIS — K50919 Crohn's disease, unspecified, with unspecified complications: Secondary | ICD-10-CM

## 2019-02-23 DIAGNOSIS — J431 Panlobular emphysema: Secondary | ICD-10-CM | POA: Diagnosis not present

## 2019-02-23 DIAGNOSIS — R7989 Other specified abnormal findings of blood chemistry: Secondary | ICD-10-CM

## 2019-02-23 DIAGNOSIS — E1165 Type 2 diabetes mellitus with hyperglycemia: Secondary | ICD-10-CM

## 2019-02-23 DIAGNOSIS — F419 Anxiety disorder, unspecified: Secondary | ICD-10-CM

## 2019-02-23 DIAGNOSIS — R945 Abnormal results of liver function studies: Secondary | ICD-10-CM

## 2019-02-23 DIAGNOSIS — I10 Essential (primary) hypertension: Secondary | ICD-10-CM

## 2019-02-23 DIAGNOSIS — J069 Acute upper respiratory infection, unspecified: Secondary | ICD-10-CM

## 2019-02-23 DIAGNOSIS — J9621 Acute and chronic respiratory failure with hypoxia: Secondary | ICD-10-CM

## 2019-02-23 NOTE — Progress Notes (Signed)
[name], female   DOB: [birth year], 63 y.o.   MRN: [id number]   Virtual Visit via video Note  This visit type was conducted due to national recommendations for restrictions regarding the COVID-19 pandemic (e.g. social distancing).  This format is felt to be most appropriate for this patient at this time.  All issues noted in this document were discussed and addressed.  No physical exam was performed (except for noted visual exam findings with Video Visits).   I connected with [name] by a video enabled telemedicine application and verified that I am speaking with the correct person using two identifiers. Location patient: home Location provider: work Persons participating in the virtual visit: patient, provider  I discussed the limitations, risks, security and privacy concerns of performing an evaluation and management service by video and the availability of in person appointments.  The patient expressed understanding and agreed to proceed.   Reason for visit: scheduled follow up.   HPI: She was hospitalized from 02/03/19 - 02/10/19 with respiratory failure with hypoxia from covid 19.  See last note for details.  Was discharged home on oxygen.  Since our last visit, she continues to require oxygen - when ambulating.  States O2 sats ranging 86%-93%.  Still with decreased energy.  Taking vitamin C, but this is affecting her appetite.  States if does not take, better appetite.  Sugars doing well - averaging 125-140.  Using her inhalers.  No chest pain.  Cough is better.  No fever.  No abdominal pain.  Bowels moving.  Feels she is gradually improving.     ROS: See pertinent positives and negatives per HPI.  Past Medical History:  Diagnosis Date  . COPD (chronic obstructive pulmonary disease) ([place])   . Crohn's disease ([place])   . Depression   . Diabetes mellitus ([place])   . Fracture, foot    post fall  . Hypercholesterolemia   . Hypertension   . IBS (irritable bowel  syndrome)   . Nephrolithiasis    s/p lithotripsy [facility])    Past Surgical History:  Procedure Laterality Date  . ABDOMINAL HYSTERECTOMY  1985  . APPENDECTOMY  1985  . BREAST EXCISIONAL BIOPSY Right 2013   benign  . COLONOSCOPY  2014  . LAPAROSCOPIC CHOLECYSTECTOMY  2005   Dr Tamala Julian  . LAPAROSCOPIC SUPRACERVICAL HYSTERECTOMY  1985   left ovary not removed  . LITHOTRIPSY     x7  . OOPHORECTOMY    . POLYPECTOMY    . SHOULDER SURGERY     right (pinning)  . SHOULDER SURGERY     left (pinning)  . TONSILLECTOMY  1966    Family History  Problem Relation Age of Onset  . Heart disease Father   . Emphysema Maternal Grandmother   . COPD Mother   . Hypercholesterolemia Mother   . Emphysema Mother   . Hypertension Brother   . Emphysema Other        grandmother  . COPD Sister   . Crohn's disease Sister   . Hypertension Sister   . Breast cancer Neg Hx   . Colon cancer Neg Hx     SOCIAL HX: reviewed.    Current Outpatient Medications:  .  acetaminophen (TYLENOL) 325 MG tablet, Take 2 tablets (650 mg total) by mouth every 6 (six) hours as needed for mild pain (or Fever >/= 101)., Disp:  , Rfl:  .  albuterol (VENTOLIN HFA) 108 (90 Base) MCG/ACT inhaler, Inhale 1-2 puffs into the  lungs every 4 (four) hours as needed for wheezing or shortness of breath., Disp:  , Rfl:  .  buPROPion (WELLBUTRIN XL) 300 MG 24 hr tablet, Take 1 tablet (300 mg total) by mouth daily., Disp: 30 tablet, Rfl: 0 .  Dulaglutide (TRULICITY) 1.5 MC/9.4BS SOPN, Inject 1.5 mg into the skin once a week., Disp: , Rfl:  .  insulin glargine (LANTUS) 100 UNIT/ML injection, Inject 20 Units into the skin daily., Disp: , Rfl:  .  Ipratropium-Albuterol (COMBIVENT) 20-100 MCG/ACT AERS respimat, Inhale 2 puffs into the lungs every 4 (four) hours while awake., Disp: 4 g, Rfl: 2 .  metoprolol tartrate (LOPRESSOR) 25 MG tablet, Take 1 tablet (25 mg total) by mouth 2 (two) times daily., Disp: 50 tablet, Rfl: 0 .   mometasone-formoterol (DULERA) 200-5 MCG/ACT AERO, Inhale 2 puffs into the lungs 2 (two) times daily., Disp: 13 g, Rfl: 1 .  potassium chloride SA (KLOR-CON) 20 MEQ tablet, Take 2 tablets (40 mEq total) by mouth 2 (two) times daily., Disp:  , Rfl:  .  vitamin C (VITAMIN C) 1000 MG tablet, Take 1 tablet (1,000 mg total) by mouth daily., Disp:  , Rfl:  .  zinc sulfate 220 (50 Zn) MG capsule, Take 1 capsule (220 mg total) by mouth daily., Disp:  , Rfl:  .  insulin aspart (NOVOLOG) 100 UNIT/ML injection, Inject 0-9 Units into the skin 3 (three) times daily with meals. (Patient not taking: Reported on 02/13/2019), Disp: 10 mL, Rfl: 11 .  insulin aspart (NOVOLOG) 100 UNIT/ML injection, Inject 0-5 Units into the skin at bedtime. (Patient not taking: Reported on 02/13/2019), Disp: 10 mL, Rfl: 11  EXAM:  VITALS per patient if applicable:  962/83  GENERAL: alert, oriented, appears well and in no acute distress  HEENT: atraumatic, conjunttiva clear, no obvious abnormalities on inspection of external nose and ears  NECK: normal movements of the head and neck  LUNGS: on inspection no signs of respiratory distress, breathing rate appears normal, no obvious gross SOB, gasping or wheezing  CV: no obvious cyanosis  PSYCH/NEURO: pleasant and cooperative, no obvious depression or anxiety, speech and thought processing grossly intact  ASSESSMENT AND PLAN:  Discussed the following assessment and plan:  Abnormal liver function tests Follow liver panel.   Acute on chronic respiratory failure with hypoxia (HCC) Still requiring oxygen with ambulation.  Persistent sob with exertion.  Refer to pulmonary for further evaluation.  Had recent CT - extensive air space opacities.  Hold on f/u HRCT - since just had CT.  Concern with persistent symptoms.  Will have pulmonary evaluate.  Pt in agreement.  Continue inhalers.    Anxiety and depression Stable. On wellbutrin.   CD (Crohn's disease) Bowels stable.     Diabetes mellitus ([place]) Followed by Dr Gabriel Carina.  Sugars per her report are doing well.  Follow.    Essential (primary) hypertension Blood pressure has been doing well.  Follow.      I discussed the assessment and treatment plan with the patient. The patient was provided an opportunity to ask questions and all were answered. The patient agreed with the plan and demonstrated an understanding of the instructions.   The patient was advised to call back or seek an in-person evaluation if the symptoms worsen or if the condition fails to improve as anticipated.   Einar Pheasant, MD

## 2019-02-28 ENCOUNTER — Encounter: Payer: Self-pay | Admitting: Internal Medicine

## 2019-02-28 NOTE — Assessment & Plan Note (Signed)
Stable. On wellbutrin.

## 2019-02-28 NOTE — Assessment & Plan Note (Signed)
Blood pressure has been doing well.  Follow.

## 2019-02-28 NOTE — Assessment & Plan Note (Signed)
Follow liver panel.

## 2019-02-28 NOTE — Assessment & Plan Note (Signed)
Bowels stable.

## 2019-02-28 NOTE — Assessment & Plan Note (Signed)
Followed by Dr Gabriel Carina.  Sugars per her report are doing well.  Follow.

## 2019-02-28 NOTE — Assessment & Plan Note (Signed)
Still requiring oxygen with ambulation.  Persistent sob with exertion.  Refer to pulmonary for further evaluation.  Had recent CT - extensive air space opacities.  Hold on f/u HRCT - since just had CT.  Concern with persistent symptoms.  Will have pulmonary evaluate.  Pt in agreement.  Continue inhalers.

## 2019-03-23 ENCOUNTER — Ambulatory Visit (INDEPENDENT_AMBULATORY_CARE_PROVIDER_SITE_OTHER): Admitting: Internal Medicine

## 2019-03-23 ENCOUNTER — Other Ambulatory Visit: Payer: Self-pay

## 2019-03-23 DIAGNOSIS — J9621 Acute and chronic respiratory failure with hypoxia: Secondary | ICD-10-CM | POA: Diagnosis not present

## 2019-03-23 DIAGNOSIS — I10 Essential (primary) hypertension: Secondary | ICD-10-CM | POA: Diagnosis not present

## 2019-03-23 DIAGNOSIS — F419 Anxiety disorder, unspecified: Secondary | ICD-10-CM | POA: Diagnosis not present

## 2019-03-23 DIAGNOSIS — F329 Major depressive disorder, single episode, unspecified: Secondary | ICD-10-CM

## 2019-03-23 DIAGNOSIS — E1165 Type 2 diabetes mellitus with hyperglycemia: Secondary | ICD-10-CM | POA: Diagnosis not present

## 2019-03-23 DIAGNOSIS — E7800 Pure hypercholesterolemia, unspecified: Secondary | ICD-10-CM

## 2019-03-23 NOTE — Progress Notes (Signed)
[name], female   DOB: 1955-09-27, 63 y.o.   MRN: [id number]   Virtual Visit via video Note  This visit type was conducted due to national recommendations for restrictions regarding the COVID-19 pandemic (e.g. social distancing).  This format is felt to be most appropriate for this patient at this time.  All issues noted in this document were discussed and addressed.  No physical exam was performed (except for noted visual exam findings with Video Visits).   I connected with [name] by a video enabled telemedicine application or telephone and verified that I am speaking with the correct person using two identifiers. Location patient: home Location provider: work  Persons participating in the virtual visit: patient, provider  The limitations, risks, security and privacy concerns of performing an evaluation and management service by video and the availability of in person appointments has been discussed.  The patient expressed understanding and agreed to proceed.   Reason for visit:  Follow up appt  HPI: Was hospitalized 10/27-11/3/20 with respiratory failure with hypoxia from covid 19.  See previous notes for details.  Since our last visit, she continues to gradually improved.  Breathing is better.  Still requiring oxygen.  She is more comfortable sitting.  Does not require oxygen at rest. When she gets up to move around, oxygen level does still drop to low 80s (and occasionally 78).  Discussed the need to wear her oxygen with ambulation.  She is eating and drinking.  No nausea or vomiting.  No chest pain.  No cough or congestion.  Blood sugars averaging 130-150s.  Seeing Dr Gabriel Carina.   Has appt planned with pulmonary 04/15/19 - for further evaluation and w/up given her persistent hypoxia with ambulation and recent covid infection.     ROS: See pertinent positives and negatives per HPI.  Past Medical History:  Diagnosis Date  . COPD (chronic obstructive pulmonary disease)  ([place])   . Crohn's disease ([place])   . Depression   . Diabetes mellitus ([place])   . Fracture, foot    post fall  . Hypercholesterolemia   . Hypertension   . IBS (irritable bowel syndrome)   . Nephrolithiasis    s/p lithotripsy [facility])    Past Surgical History:  Procedure Laterality Date  . ABDOMINAL HYSTERECTOMY  1985  . APPENDECTOMY  1985  . BREAST EXCISIONAL BIOPSY Right 2013   benign  . COLONOSCOPY  2014  . LAPAROSCOPIC CHOLECYSTECTOMY  2005   Dr Tamala Julian  . LAPAROSCOPIC SUPRACERVICAL HYSTERECTOMY  1985   left ovary not removed  . LITHOTRIPSY     x7  . OOPHORECTOMY    . POLYPECTOMY    . SHOULDER SURGERY     right (pinning)  . SHOULDER SURGERY     left (pinning)  . TONSILLECTOMY  1966    Family History  Problem Relation Age of Onset  . Heart disease Father   . Emphysema Maternal Grandmother   . COPD Mother   . Hypercholesterolemia Mother   . Emphysema Mother   . Hypertension Brother   . Emphysema Other        grandmother  . COPD Sister   . Crohn's disease Sister   . Hypertension Sister   . Breast cancer Neg Hx   . Colon cancer Neg Hx     SOCIAL HX: reviewed.    Current Outpatient Medications:  .  acetaminophen (TYLENOL) 325 MG tablet, Take 2 tablets (650 mg total) by mouth every 6 (six) hours  as needed for mild pain (or Fever >/= 101)., Disp:  , Rfl:  .  albuterol (VENTOLIN HFA) 108 (90 Base) MCG/ACT inhaler, Inhale 1-2 puffs into the lungs every 4 (four) hours as needed for wheezing or shortness of breath., Disp:  , Rfl:  .  buPROPion (WELLBUTRIN XL) 300 MG 24 hr tablet, Take 1 tablet (300 mg total) by mouth daily., Disp: 30 tablet, Rfl: 0 .  Dulaglutide (TRULICITY) 1.64 YO/4.5TX SOPN, Inject 1.5 mg into the skin once a week., Disp: , Rfl:  .  insulin aspart (NOVOLOG) 100 UNIT/ML injection, Inject 0-9 Units into the skin 3 (three) times daily with meals. (Patient not taking: Reported on 02/13/2019), Disp: 10 mL, Rfl: 11 .  insulin aspart (NOVOLOG)  100 UNIT/ML injection, Inject 0-5 Units into the skin at bedtime. (Patient not taking: Reported on 02/13/2019), Disp: 10 mL, Rfl: 11 .  insulin glargine (LANTUS) 100 UNIT/ML injection, Inject 20 Units into the skin daily., Disp: , Rfl:  .  Ipratropium-Albuterol (COMBIVENT) 20-100 MCG/ACT AERS respimat, Inhale 2 puffs into the lungs every 4 (four) hours while awake., Disp: 4 g, Rfl: 2 .  metoprolol tartrate (LOPRESSOR) 25 MG tablet, Take 1 tablet (25 mg total) by mouth 2 (two) times daily., Disp: 50 tablet, Rfl: 0 .  mometasone-formoterol (DULERA) 200-5 MCG/ACT AERO, Inhale 2 puffs into the lungs 2 (two) times daily., Disp: 13 g, Rfl: 1 .  potassium chloride SA (KLOR-CON) 20 MEQ tablet, Take 2 tablets (40 mEq total) by mouth 2 (two) times daily., Disp:  , Rfl:  .  vitamin C (VITAMIN C) 1000 MG tablet, Take 1 tablet (1,000 mg total) by mouth daily., Disp:  , Rfl:  .  zinc sulfate 220 (50 Zn) MG capsule, Take 1 capsule (220 mg total) by mouth daily., Disp:  , Rfl:   EXAM:  GENERAL: alert, oriented, appears well and in no acute distress  HEENT: atraumatic, conjunttiva clear, no obvious abnormalities on inspection of external nose and ears  NECK: normal movements of the head and neck  LUNGS: on inspection no signs of respiratory distress, breathing rate appears normal, no obvious gross SOB, gasping or wheezing  CV: no obvious cyanosis  PSYCH/NEURO: pleasant and cooperative, no obvious depression or anxiety, speech and thought processing grossly intact  ASSESSMENT AND PLAN:  Discussed the following assessment and plan:  Acute on chronic respiratory failure with hypoxia (HCC) Still requiring oxygen with ambulation.  No cough or congestion.  Recent CT - extensive air space opacities - recent covid infection.  Hold on f/u HRCT.  Has appt with pulmonary 04/15/19.  Pulmonary to evaluate.  Continue inhalers.  Follow.    Anxiety and depression Stable.    Diabetes mellitus ([place]) Sugars as outlined.   Seeing Dr Gabriel Carina.  Follow met b and a1c.  Continue low carb diet and exercise.    Essential (primary) hypertension Follow blood pressures.    Hypercholesterolemia without hypertriglyceridemia Low cholesterol diet and exercise.  Off crestor.  Follow lipid panel and liver function tests.     I discussed the assessment and treatment plan with the patient. The patient was provided an opportunity to ask questions and all were answered. The patient agreed with the plan and demonstrated an understanding of the instructions.   The patient was advised to call back or seek an in-person evaluation if the symptoms worsen or if the condition fails to improve as anticipated.   Einar Pheasant, MD

## 2019-03-24 ENCOUNTER — Other Ambulatory Visit: Payer: Self-pay | Admitting: Internal Medicine

## 2019-03-24 ENCOUNTER — Telehealth: Payer: Self-pay | Admitting: Internal Medicine

## 2019-03-24 ENCOUNTER — Encounter: Payer: Self-pay | Admitting: Internal Medicine

## 2019-03-24 NOTE — Assessment & Plan Note (Signed)
Follow blood pressures.

## 2019-03-24 NOTE — Assessment & Plan Note (Signed)
Low cholesterol diet and exercise.  Off crestor.  Follow lipid panel and liver function tests.

## 2019-03-24 NOTE — Assessment & Plan Note (Signed)
Stable

## 2019-03-24 NOTE — Telephone Encounter (Signed)
Called and lm for pt to call back and schedule a 56mfollow up along with labs a couple days before her appt

## 2019-03-24 NOTE — Assessment & Plan Note (Signed)
Sugars as outlined.  Seeing Dr Gabriel Carina.  Follow met b and a1c.  Continue low carb diet and exercise.

## 2019-03-24 NOTE — Assessment & Plan Note (Signed)
Still requiring oxygen with ambulation.  No cough or congestion.  Recent CT - extensive air space opacities - recent covid infection.  Hold on f/u HRCT.  Has appt with pulmonary 04/15/19.  Pulmonary to evaluate.  Continue inhalers.  Follow.

## 2019-03-25 ENCOUNTER — Other Ambulatory Visit: Payer: Self-pay

## 2019-03-25 MED ORDER — FLUCONAZOLE 150 MG PO TABS: ORAL_TABLET | ORAL | 0 refills | Status: DC

## 2019-06-02 ENCOUNTER — Other Ambulatory Visit: Payer: Self-pay

## 2019-06-02 ENCOUNTER — Other Ambulatory Visit (INDEPENDENT_AMBULATORY_CARE_PROVIDER_SITE_OTHER)

## 2019-06-02 DIAGNOSIS — E1165 Type 2 diabetes mellitus with hyperglycemia: Secondary | ICD-10-CM | POA: Diagnosis not present

## 2019-06-02 DIAGNOSIS — I10 Essential (primary) hypertension: Secondary | ICD-10-CM

## 2019-06-02 DIAGNOSIS — E7800 Pure hypercholesterolemia, unspecified: Secondary | ICD-10-CM | POA: Diagnosis not present

## 2019-06-02 LAB — BASIC METABOLIC PANEL
BUN: 13 mg/dL (ref 6–23)
CO2: 24 mEq/L (ref 19–32)
Calcium: 9.6 mg/dL (ref 8.4–10.5)
Chloride: 105 mEq/L (ref 96–112)
Creatinine, Ser: 0.74 mg/dL (ref 0.40–1.20)
GFR: 78.93 mL/min (ref >60.00)
Glucose, Bld: 130 mg/dL — ABNORMAL HIGH (ref 70–99)
Potassium: 4.3 mEq/L (ref 3.5–5.1)
Sodium: 139 mEq/L (ref 135–145)

## 2019-06-02 LAB — TSH: TSH: 2.15 u[IU]/mL (ref 0.35–4.50)

## 2019-06-02 LAB — MICROALBUMIN / CREATININE URINE RATIO
Creatinine,U: 55.7 mg/dL
Microalb Creat Ratio: 1.3 mg/g (ref 0.0–30.0)
Microalb, Ur: 0.7 mg/dL (ref 0.0–1.9)

## 2019-06-02 LAB — LIPID PANEL
Cholesterol: 208 mg/dL — ABNORMAL HIGH (ref 0–200)
HDL: 39.2 mg/dL (ref >39.00)
NonHDL: 169.02
Total CHOL/HDL Ratio: 5
Triglycerides: 396 mg/dL — ABNORMAL HIGH (ref 0.0–149.0)
VLDL: 79.2 mg/dL — ABNORMAL HIGH (ref 0.0–40.0)

## 2019-06-02 LAB — CBC WITH DIFFERENTIAL/PLATELET
Basophils Absolute: 0.1 10*3/uL (ref 0.0–0.1)
Basophils Relative: 1 % (ref 0.0–3.0)
Eosinophils Absolute: 0.4 10*3/uL (ref 0.0–0.7)
Eosinophils Relative: 4.7 % (ref 0.0–5.0)
HCT: 47 % — ABNORMAL HIGH (ref 36.0–46.0)
Hemoglobin: 15 g/dL (ref 12.0–15.0)
Lymphocytes Relative: 31.1 % (ref 12.0–46.0)
Lymphs Abs: 2.6 10*3/uL (ref 0.7–4.0)
MCHC: 31.9 g/dL (ref 30.0–36.0)
MCV: 84.8 fl (ref 78.0–100.0)
Monocytes Absolute: 0.9 10*3/uL (ref 0.1–1.0)
Monocytes Relative: 10.2 % (ref 3.0–12.0)
Neutro Abs: 4.5 10*3/uL (ref 1.4–7.7)
Neutrophils Relative %: 53 % (ref 43.0–77.0)
Platelets: 195 10*3/uL (ref 150.0–400.0)
RBC: 5.54 Mil/uL — ABNORMAL HIGH (ref 3.87–5.11)
RDW: 16.7 % — ABNORMAL HIGH (ref 11.5–15.5)
WBC: 8.4 10*3/uL (ref 4.0–10.5)

## 2019-06-02 LAB — HEPATIC FUNCTION PANEL
ALT: 19 U/L (ref 0–35)
AST: 16 U/L (ref 0–37)
Albumin: 4 g/dL (ref 3.5–5.2)
Alkaline Phosphatase: 73 U/L (ref 39–117)
Bilirubin, Direct: 0.1 mg/dL (ref 0.0–0.3)
Total Bilirubin: 0.3 mg/dL (ref 0.2–1.2)
Total Protein: 7.3 g/dL (ref 6.0–8.3)

## 2019-06-02 LAB — LDL CHOLESTEROL, DIRECT: Direct LDL: 120 mg/dL

## 2019-06-04 ENCOUNTER — Ambulatory Visit: Admitting: Internal Medicine

## 2019-06-04 LAB — HEMOGLOBIN A1C: Hgb A1c MFr Bld: 7 % — ABNORMAL HIGH (ref 4.6–6.5)

## 2019-06-16 ENCOUNTER — Other Ambulatory Visit: Payer: Self-pay

## 2019-06-16 ENCOUNTER — Encounter: Payer: Self-pay | Admitting: Internal Medicine

## 2019-06-16 ENCOUNTER — Ambulatory Visit (INDEPENDENT_AMBULATORY_CARE_PROVIDER_SITE_OTHER): Admitting: Internal Medicine

## 2019-06-16 VITALS — BP 128/70 | HR 80 | Temp 96.7°F | Resp 16 | Wt 214.0 lb

## 2019-06-16 DIAGNOSIS — F419 Anxiety disorder, unspecified: Secondary | ICD-10-CM

## 2019-06-16 DIAGNOSIS — J4520 Mild intermittent asthma, uncomplicated: Secondary | ICD-10-CM

## 2019-06-16 DIAGNOSIS — R945 Abnormal results of liver function studies: Secondary | ICD-10-CM | POA: Diagnosis not present

## 2019-06-16 DIAGNOSIS — E1165 Type 2 diabetes mellitus with hyperglycemia: Secondary | ICD-10-CM

## 2019-06-16 DIAGNOSIS — J1282 Pneumonia due to coronavirus disease 2019: Secondary | ICD-10-CM

## 2019-06-16 DIAGNOSIS — R7989 Other specified abnormal findings of blood chemistry: Secondary | ICD-10-CM

## 2019-06-16 DIAGNOSIS — D45 Polycythemia vera: Secondary | ICD-10-CM

## 2019-06-16 DIAGNOSIS — K50919 Crohn's disease, unspecified, with unspecified complications: Secondary | ICD-10-CM

## 2019-06-16 DIAGNOSIS — F329 Major depressive disorder, single episode, unspecified: Secondary | ICD-10-CM

## 2019-06-16 DIAGNOSIS — E7800 Pure hypercholesterolemia, unspecified: Secondary | ICD-10-CM

## 2019-06-16 DIAGNOSIS — K219 Gastro-esophageal reflux disease without esophagitis: Secondary | ICD-10-CM

## 2019-06-16 DIAGNOSIS — I10 Essential (primary) hypertension: Secondary | ICD-10-CM

## 2019-06-16 DIAGNOSIS — U071 COVID-19: Secondary | ICD-10-CM

## 2019-06-16 DIAGNOSIS — Z1231 Encounter for screening mammogram for malignant neoplasm of breast: Secondary | ICD-10-CM

## 2019-06-16 MED ORDER — FLUCONAZOLE 150 MG PO TABS: ORAL_TABLET | ORAL | 0 refills | Status: DC

## 2019-06-16 NOTE — Progress Notes (Signed)
[name], female   DOB: 18-Apr-1955, 64 y.o.   MRN: [id number]   Subjective:    [name], female    DOB: [birth year], 64 y.o.   MRN: [id number]  HPI  Patient here for a scheduled follow up.  She is doing much better. Was diagnosed with covid 01/2019.  Hospitalized x 2.  Received convalescent plasma, remdesevir and decadron.  Has a history of COPD.  Seeing pulmonary - Dr Laural Roes.  Instructed to continue combivent and advair.  CXR f/u appt.  Feels better.  Has started back crafting.  Energy better.  Pulse ox 95-97%.  No nausea or vomiting.  No diarrhea.  Blood pressures averaging 120-130/70s.  Sugars doing well.  Handling stress.     Past Medical History:  Diagnosis Date  . COPD (chronic obstructive pulmonary disease) ([place])   . Crohn's disease ([place])   . Depression   . Diabetes mellitus ([place])   . Fracture, foot    post fall  . Hypercholesterolemia   . Hypertension   . IBS (irritable bowel syndrome)   . Nephrolithiasis    s/p lithotripsy [facility])   Past Surgical History:  Procedure Laterality Date  . ABDOMINAL HYSTERECTOMY  1985  . APPENDECTOMY  1985  . BREAST EXCISIONAL BIOPSY Right 2013   benign  . COLONOSCOPY  2014  . LAPAROSCOPIC CHOLECYSTECTOMY  2005   Dr Tamala Julian  . LAPAROSCOPIC SUPRACERVICAL HYSTERECTOMY  1985   left ovary not removed  . LITHOTRIPSY     x7  . OOPHORECTOMY    . POLYPECTOMY    . SHOULDER SURGERY     right (pinning)  . SHOULDER SURGERY     left (pinning)  . TONSILLECTOMY  1966   Family History  Problem Relation Age of Onset  . Heart disease Father   . Emphysema Maternal Grandmother   . COPD Mother   . Hypercholesterolemia Mother   . Emphysema Mother   . Hypertension Brother   . Emphysema Other        grandmother  . COPD Sister   . Crohn's disease Sister   . Hypertension Sister   . Breast cancer Neg Hx   . Colon cancer Neg Hx    Social History   Socioeconomic History  . Marital status:  Married    Spouse name: Not on file  . Number of children: 2  . Years of education: Not on file  . Highest education level: Not on file  Occupational History    Employer: [employer]  Tobacco Use  . Smoking status: Former Smoker    Packs/day: 2.00    Years: 15.00    Pack years: 30.00    Quit date: 04/09/1994    Years since quitting: 25.2  . Smokeless tobacco: Never Used  . Tobacco comment: QUIT IN 1990'S  Substance and Sexual Activity  . Alcohol use: Not Currently    Alcohol/week: 0.0 standard drinks  . Drug use: No  . Sexual activity: Not on file  Other Topics Concern  . Not on file  Social History Narrative  . Not on file   Social Determinants of Health   Financial Resource Strain:   . Difficulty of Paying Living Expenses:   Food Insecurity:   . Worried About Charity fundraiser in the Last Year:   . Arboriculturist in the Last Year:   Transportation Needs:   . Film/video editor (Medical):   Marland Kitchen Lack of Transportation (Non-Medical):  Physical Activity:   . Days of Exercise per Week:   . Minutes of Exercise per Session:   Stress:   . Feeling of Stress :   Social Connections:   . Frequency of Communication with Friends and Family:   . Frequency of Social Gatherings with Friends and Family:   . Attends Religious Services:   . Active Member of Clubs or Organizations:   . Attends Archivist Meetings:   Marland Kitchen Marital Status:     Outpatient Encounter Medications as of 06/16/2019  Medication Sig  . acetaminophen (TYLENOL) 325 MG tablet Take 2 tablets (650 mg total) by mouth every 6 (six) hours as needed for mild pain (or Fever >/= 101).  Marland Kitchen amLODipine (NORVASC) 5 MG tablet Take 5 mg by mouth daily.  Marland Kitchen buPROPion (WELLBUTRIN XL) 300 MG 24 hr tablet Take 1 tablet (300 mg total) by mouth daily.  . Dulaglutide (TRULICITY) 1.5 AS/6.0RV SOPN Inject 1.5 mg into the skin once a week.  . fluconazole (DIFLUCAN) 150 MG tablet Take one tablet by mouth for one dose. MAY  REPEAT X1 IF PERSISTENT SYMPTOMS AFTER 3 DAYS  . insulin glargine (LANTUS) 100 UNIT/ML injection Inject 20 Units into the skin daily.  . Ipratropium-Albuterol (COMBIVENT) 20-100 MCG/ACT AERS respimat Inhale 2 puffs into the lungs every 4 (four) hours while awake.  Marland Kitchen JARDIANCE 25 MG TABS tablet Take 25 mg by mouth daily.  . metFORMIN (GLUCOPHAGE) 500 MG tablet Take 1,000 mg by mouth 2 (two) times daily.  . metoprolol tartrate (LOPRESSOR) 25 MG tablet Take 1 tablet (25 mg total) by mouth 2 (two) times daily.  . RABEprazole (ACIPHEX) 20 MG tablet   . ULTRACARE PEN NEEDLES 32G X 4 MM MISC   . vitamin C (VITAMIN C) 1000 MG tablet Take 1 tablet (1,000 mg total) by mouth daily.  . [DISCONTINUED] albuterol (VENTOLIN HFA) 108 (90 Base) MCG/ACT inhaler Inhale 1-2 puffs into the lungs every 4 (four) hours as needed for wheezing or shortness of breath.  . [DISCONTINUED] fluconazole (DIFLUCAN) 150 MG tablet Take one tablet by mouth for one dose. MAY REPEAT X1 IF PERSISTENT SYMPTOMS AFTER 3 DAYS  . [DISCONTINUED] insulin aspart (NOVOLOG) 100 UNIT/ML injection Inject 0-9 Units into the skin 3 (three) times daily with meals. (Patient not taking: Reported on 02/13/2019)  . [DISCONTINUED] insulin aspart (NOVOLOG) 100 UNIT/ML injection Inject 0-5 Units into the skin at bedtime. (Patient not taking: Reported on 02/13/2019)  . [DISCONTINUED] mometasone-formoterol (DULERA) 200-5 MCG/ACT AERO Inhale 2 puffs into the lungs 2 (two) times daily.  . [DISCONTINUED] potassium chloride SA (KLOR-CON) 20 MEQ tablet Take 2 tablets (40 mEq total) by mouth 2 (two) times daily.  . [DISCONTINUED] zinc sulfate 220 (50 Zn) MG capsule Take 1 capsule (220 mg total) by mouth daily.   No facility-administered encounter medications on file as of 06/16/2019.    Review of Systems  Constitutional: Negative for appetite change and unexpected weight change.       Energy better.    HENT: Negative for congestion and sinus pressure.   Respiratory:  Negative for cough and chest tightness.        Breathing improved.    Cardiovascular: Negative for chest pain, palpitations and leg swelling.  Gastrointestinal: Negative for abdominal pain, diarrhea, nausea and vomiting.  Genitourinary: Negative for difficulty urinating and dysuria.  Musculoskeletal: Negative for joint swelling and myalgias.  Skin: Negative for color change and rash.  Neurological: Negative for dizziness, light-headedness and headaches.  Psychiatric/Behavioral: Negative  for agitation and dysphoric mood.       Objective:    Physical Exam Constitutional:      General: She is not in acute distress.    Appearance: Normal appearance.  HENT:     Head: Normocephalic and atraumatic.     Right Ear: External ear normal.     Left Ear: External ear normal.  Eyes:     General: No scleral icterus.       Right eye: No discharge.        Left eye: No discharge.     Conjunctiva/sclera: Conjunctivae normal.  Neck:     Thyroid: No thyromegaly.  Cardiovascular:     Rate and Rhythm: Normal rate and regular rhythm.  Pulmonary:     Effort: No respiratory distress.     Breath sounds: Normal breath sounds. No wheezing.  Abdominal:     General: Bowel sounds are normal.     Palpations: Abdomen is soft.     Tenderness: There is no abdominal tenderness.  Musculoskeletal:        General: No swelling or tenderness.     Cervical back: Neck supple. No tenderness.  Lymphadenopathy:     Cervical: No cervical adenopathy.  Skin:    Findings: No erythema or rash.  Neurological:     Mental Status: She is alert.  Psychiatric:        Mood and Affect: Mood normal.        Behavior: Behavior normal.     BP 128/70   Pulse 80   Temp (!) 96.7 F (35.9 C)   Resp 16   Wt 214 lb (97.1 kg)   SpO2 97%   BMI 35.07 kg/m  Wt Readings from Last 3 Encounters:  06/16/19 214 lb (97.1 kg)  02/23/19 195 lb (88.5 kg)  02/13/19 190 lb (86.2 kg)     Lab Results  Component Value Date   WBC 8.4  06/02/2019   HGB 15.0 06/02/2019   HCT 47.0 (H) 06/02/2019   PLT 195.0 06/02/2019   GLUCOSE 130 (H) 06/02/2019   CHOL 208 (H) 06/02/2019   TRIG 396.0 (H) 06/02/2019   HDL 39.20 06/02/2019   LDLDIRECT 120.0 06/02/2019   LDLCALC 148 (H) 07/24/2013   ALT 19 06/02/2019   AST 16 06/02/2019   NA 139 06/02/2019   K 4.3 06/02/2019   CL 105 06/02/2019   CREATININE 0.74 06/02/2019   BUN 13 06/02/2019   CO2 24 06/02/2019   TSH 2.15 06/02/2019   INR 1.1 02/02/2019   HGBA1C 7.0 (H) 06/02/2019   MICROALBUR 0.7 06/02/2019    [place] EKG SITE RITE  Result Date: 02/04/2019 If Site Rite image not attached, placement could not be confirmed due to current cardiac rhythm.      Assessment & Plan:   Problem List Items Addressed This Visit    Abnormal liver function tests    Diet and exercise.  Follow liver function tests.        Anxiety and depression    Stable.  On wellbutrin.        Asthma    Continue inhalers as outlined.  Breathing improved.       CD (Crohn's disease) ([place])    Bowels stable.        Diabetes mellitus ([place])    Followed by Dr Gabriel Carina.  Sugars per her report have been doing well.  a1c 7.0 06/02/19.  Low carb diet and exercise.  Follow met b and a1c.  Relevant Medications   metFORMIN (GLUCOPHAGE) 500 MG tablet   JARDIANCE 25 MG TABS tablet   Other Relevant Orders   Hemoglobin A1c   Essential (primary) hypertension    Blood pressure as outlined.  Remains on amlodipine, metoprolol.  Follow pressures.  Follow metabolic panel.       Relevant Medications   amLODipine (NORVASC) 5 MG tablet   Other Relevant Orders   CBC with Differential/Platelet   Basic metabolic panel   GERD (gastroesophageal reflux disease)    Upper symptoms controlled on aciphex.        Relevant Medications   RABEprazole (ACIPHEX) 20 MG tablet   Hypercholesterolemia without hypertriglyceridemia    Off crestor.  Low cholesterol diet and exercise.  Follow lipid panel.       Relevant  Medications   amLODipine (NORVASC) 5 MG tablet   Other Relevant Orders   Hepatic function panel   Lipid panel   Pneumonia due to 2019-nCoV    Recently hospitalized as outlined.  Seeing pulmonary.  Breathing much improved.  Oxygenation better.  Using inhalers.  Keep f/u with pulmonary.  Planning for f/u cxr.       Relevant Medications   fluconazole (DIFLUCAN) 150 MG tablet   Polycythemia vera ([place])    Has seen hematology.  Released.  Follow cbc.       Relevant Medications   fluconazole (DIFLUCAN) 150 MG tablet    Other Visit Diagnoses    Encounter for screening mammogram for malignant neoplasm of breast    -  Primary   Relevant Orders   MM 3D SCREEN BREAST BILATERAL       Einar Pheasant, MD

## 2019-06-21 ENCOUNTER — Encounter: Payer: Self-pay | Admitting: Internal Medicine

## 2019-06-21 NOTE — Assessment & Plan Note (Signed)
Has seen hematology.  Released.  Follow cbc.

## 2019-06-21 NOTE — Assessment & Plan Note (Signed)
Off crestor.  Low cholesterol diet and exercise.  Follow lipid panel.

## 2019-06-21 NOTE — Assessment & Plan Note (Signed)
Continue inhalers as outlined.  Breathing improved.

## 2019-06-21 NOTE — Assessment & Plan Note (Signed)
Upper symptoms controlled on aciphex.

## 2019-06-21 NOTE — Assessment & Plan Note (Signed)
Diet and exercise.  Follow liver function tests.

## 2019-06-21 NOTE — Assessment & Plan Note (Signed)
Stable.  On wellbutrin.

## 2019-06-21 NOTE — Assessment & Plan Note (Signed)
Followed by Dr Gabriel Carina.  Sugars per her report have been doing well.  a1c 7.0 06/02/19.  Low carb diet and exercise.  Follow met b and a1c.

## 2019-06-21 NOTE — Assessment & Plan Note (Signed)
Recently hospitalized as outlined.  Seeing pulmonary.  Breathing much improved.  Oxygenation better.  Using inhalers.  Keep f/u with pulmonary.  Planning for f/u cxr.

## 2019-06-21 NOTE — Assessment & Plan Note (Signed)
Bowels stable.

## 2019-06-21 NOTE — Assessment & Plan Note (Signed)
Blood pressure as outlined.  Remains on amlodipine, metoprolol.  Follow pressures.  Follow metabolic panel.

## 2019-07-06 ENCOUNTER — Ambulatory Visit
Admission: RE | Admit: 2019-07-06 | Discharge: 2019-07-06 | Disposition: A | Discharge status: Home / Self Care | Source: Ambulatory Visit | Attending: Internal Medicine | Admitting: Internal Medicine

## 2019-07-06 DIAGNOSIS — Z1231 Encounter for screening mammogram for malignant neoplasm of breast: Secondary | ICD-10-CM | POA: Insufficient documentation

## 2019-07-07 ENCOUNTER — Other Ambulatory Visit: Payer: Self-pay | Admitting: Internal Medicine

## 2019-07-07 DIAGNOSIS — N6452 Nipple discharge: Secondary | ICD-10-CM

## 2019-07-21 ENCOUNTER — Other Ambulatory Visit

## 2019-07-21 ENCOUNTER — Inpatient Hospital Stay: Admission: RE | Admit: 2019-07-21 | Source: Ambulatory Visit

## 2019-07-21 ENCOUNTER — Ambulatory Visit

## 2019-07-22 ENCOUNTER — Telehealth: Payer: Self-pay | Admitting: Internal Medicine

## 2019-07-22 NOTE — Telephone Encounter (Signed)
Pt states that her right big foot is swollen and painful. She thinks she is having a gout flare-up. She is not sure if she needs an appt or advise. Please call back

## 2019-07-22 NOTE — Telephone Encounter (Signed)
Patient stated it is just the Amanda Wiley. Very red and swollen. She has had sx before in the past. Patient would like medication called in [facility]. Sending to Dr Derrel Nip due to PCP not being in office. Was informed to send to Dr Derrel Nip.

## 2019-07-22 NOTE — Telephone Encounter (Signed)
Dr Nicki Reaper has never treated her for gout,  So I cannot treat her  For gout unless I can examine her .  Please schedule her an appt or direct her  to urgent care if no  Appointments  are available in he nect 24 hours

## 2019-07-23 NOTE — Telephone Encounter (Signed)
Agree with need for evaluation.

## 2019-07-23 NOTE — Telephone Encounter (Signed)
Pt called in following up on what she should do. I let her know what Dr. Derrel Nip suggested. We don't have any appointments available. Pt said she was going to go to a walk in clinic to be seen for gout. She wish she knew this yesterday and she would have went then.

## 2019-07-24 ENCOUNTER — Encounter

## 2019-07-24 ENCOUNTER — Other Ambulatory Visit

## 2019-08-05 ENCOUNTER — Ambulatory Visit
Admission: RE | Admit: 2019-08-05 | Discharge: 2019-08-05 | Disposition: A | Discharge status: Home / Self Care | Source: Ambulatory Visit | Attending: Internal Medicine | Admitting: Internal Medicine

## 2019-08-05 DIAGNOSIS — N6452 Nipple discharge: Secondary | ICD-10-CM | POA: Diagnosis not present

## 2019-08-07 ENCOUNTER — Other Ambulatory Visit: Payer: Self-pay | Admitting: Internal Medicine

## 2019-08-07 DIAGNOSIS — N6452 Nipple discharge: Secondary | ICD-10-CM

## 2019-08-07 NOTE — Progress Notes (Signed)
Order placed for breast MRI

## 2019-08-10 ENCOUNTER — Telehealth: Payer: Self-pay | Admitting: Internal Medicine

## 2019-08-10 MED ORDER — LORAZEPAM 0.5 MG PO TABS: ORAL_TABLET | ORAL | 0 refills | Status: DC

## 2019-08-10 NOTE — Telephone Encounter (Signed)
Pt would like to have something to calm her down getting the MRI. Please advise to pt. Thank you!  Pharmacy is  [facility], [place]. Phone:  [phone]  Fax:  [phone]     Pt appt is on 08/21/2019.

## 2019-08-10 NOTE — Telephone Encounter (Signed)
Patient is having MRI done on 5/14 and would like to have something to take that day prior to having it done.

## 2019-08-10 NOTE — Telephone Encounter (Signed)
rx sent in for lorazepam.  May take 1/2 tablet before MRI.  May repeat x 1.  Will need someone to drive her.

## 2019-08-10 NOTE — Addendum Note (Signed)
Addended by: Alisa Graff on: 08/10/2019 08:20 PM   Modules accepted: Orders

## 2019-08-11 NOTE — Telephone Encounter (Signed)
Patient is aware 

## 2019-08-19 ENCOUNTER — Telehealth: Payer: Self-pay

## 2019-08-19 DIAGNOSIS — N6452 Nipple discharge: Secondary | ICD-10-CM

## 2019-08-19 NOTE — Telephone Encounter (Signed)
Nothing further needed 

## 2019-08-19 NOTE — Telephone Encounter (Signed)
Order signed.  Let me know if I need to do anything more.

## 2019-08-19 NOTE — Telephone Encounter (Signed)
Received call from radiology stating that order for MRI that is scheduled for 5/14 is in wrong. I have pended the correct image code that was given to me per radiology. Will not let me sign. Please review and let me know if I need to do anything further.

## 2019-08-21 ENCOUNTER — Other Ambulatory Visit: Payer: Self-pay

## 2019-08-21 ENCOUNTER — Ambulatory Visit
Admission: RE | Admit: 2019-08-21 | Discharge: 2019-08-21 | Disposition: A | Discharge status: Home / Self Care | Source: Ambulatory Visit | Attending: Internal Medicine | Admitting: Internal Medicine

## 2019-08-21 DIAGNOSIS — N6452 Nipple discharge: Secondary | ICD-10-CM | POA: Insufficient documentation

## 2019-08-21 MED ORDER — GADOBUTROL 1 MMOL/ML IV SOLN
10.0000 mL | Freq: Once | INTRAVENOUS | Status: AC | PRN
  Administered 2019-08-21: 10 mL via INTRAVENOUS

## 2019-08-26 ENCOUNTER — Other Ambulatory Visit: Payer: Self-pay | Admitting: Internal Medicine

## 2019-08-26 DIAGNOSIS — N6452 Nipple discharge: Secondary | ICD-10-CM

## 2019-08-26 DIAGNOSIS — D249 Benign neoplasm of unspecified breast: Secondary | ICD-10-CM

## 2019-08-26 DIAGNOSIS — R928 Other abnormal and inconclusive findings on diagnostic imaging of breast: Secondary | ICD-10-CM

## 2019-08-26 NOTE — Progress Notes (Signed)
Order placed for referral to Dr Bary Castilla.

## 2019-08-30 ENCOUNTER — Other Ambulatory Visit: Payer: Self-pay | Admitting: General Surgery

## 2019-09-02 ENCOUNTER — Other Ambulatory Visit: Payer: Self-pay

## 2019-09-02 ENCOUNTER — Encounter
Admission: RE | Admit: 2019-09-02 | Discharge: 2019-09-02 | Disposition: A | Discharge status: Home / Self Care | Source: Ambulatory Visit | Attending: General Surgery | Admitting: General Surgery

## 2019-09-02 DIAGNOSIS — I10 Essential (primary) hypertension: Secondary | ICD-10-CM | POA: Diagnosis not present

## 2019-09-02 DIAGNOSIS — R9431 Abnormal electrocardiogram [ECG] [EKG]: Secondary | ICD-10-CM | POA: Insufficient documentation

## 2019-09-02 HISTORY — DX: Other complications of anesthesia, initial encounter: T88.59XA

## 2019-09-02 HISTORY — DX: Unspecified osteoarthritis, unspecified site: M19.90

## 2019-09-02 NOTE — Pre-Procedure Instructions (Signed)
Progress Notes - documented in this encounter Amanda Glazier, MD - 07/21/2019 3:00 PM EDT Formatting of this note is different from the original. Images from the original note were not included.     DIVISION OF PULMONARY AND CRITICAL CARE MEDICINE FOLLOW UP PATIENT ENCOUNTER   Chief Complaint: Post COVID19 evaluation with worsening SOB/DOE  Interval history :  [name] is 64 y.o. female who was referred to [facility] Pulmonary clinic due to DOE/SOB , hx of recent hospitalization with COVID 61. Background of emphysema. Patient was dx with COVID19 October 01/2019 with hospitalization at [facility] then was readmitted at [facility] to ICU with transfer to [facility] again. She received convalescent plasma, remdesevir, decadron. She does have background hx of COPD. She previously worked at [employer], and after retirement she noted significant improvement in breathing. She was using advair/spiriva. She had severe dysaturation with minimal exertion post COVID hospitalization but has slowly improved to have O2 saturation >88% but still requiring 2L/min [place]. She was able to get to 764m TV on incentive spirometer after COVID while before it was >1200. Currently mMRC is at 3-4 (getting dyspneic while showering/dressing herself)  07/21/19 - patient is dong well, she has not used O2. She states she does go out and do all ADLs, she is very happy.   Past medical History: She has a past medical history of COPD (chronic obstructive pulmonary disease) (CMS-HCC), Hypertension, Obesity, Pneumonia due to COVID-19 virus (01/2019), and Type 2 diabetes mellitus (CMS-HCC).  is allergic to moxifloxacin, carbamazepine, fexofenadine, ibuprofen, iodine, sulfa (sulfonamide antibiotics), ioxaglate meglumine, and ioxaglate sodium.  ROS: 10 point review of systems has been conducted during interview and evaluation today. Remainder has been reviewed and is negative except as per HPI  Current Medications (including changes made  at this visit) Current Outpatient Medications  Medication Sig Dispense Refill  . albuterol (PROVENTIL) 2.5 mg /3 mL (0.083 %) nebulizer solution Take 3 mLs (2.5 mg total) by nebulization every 6 (six) hours as needed for wheezing.  .Marland Kitchenalbuterol 90 mcg/actuation inhaler Inhale 2 inhalations into the lungs continuously as needed.  .Marland KitchenamLODIPine (NORVASC) 5 MG tablet Take 5 mg by mouth once daily.  .Marland Kitchenascorbic acid (VITAMIN C ORAL) Take by mouth  . blood glucose meter kit USE TO CHECK BLOOD SUGARS DAILY. PLEASE DISPENSE WHAT IS COVERED THROUGH PATIENTS INSURANCE (Patient not taking: Reported on 05/27/2018 ) 1 each 0  . buPROPion (WELLBUTRIN XL) 150 MG XL tablet Take 300 mg by mouth every morning.   . dulaglutide (TRULICITY) 1.5 mEV/0.3mL subcutaneous injection Inject 0.5 mLs (1.5 mg total) subcutaneously every 7 (seven) days 6 mL 0  . empagliflozin (JARDIANCE) 25 mg tablet Take 1 tablet (25 mg total) by mouth once daily 90 tablet 1  . esomeprazole (NEXIUM) 40 MG DR capsule Take 40 mg by mouth 2 (two) times daily.  .Marland Kitchenexenatide microspheres (BYDUREON BCISE) 2 mg/0.85 mL AtIn Inject 2 mg subcutaneously every 7 (seven) days (Patient not taking: Reported on 03/02/2019 ) 12 Syringe 1  . fluticasone (FLONASE) 50 mcg/actuation nasal spray Place 2 sprays into both nostrils as needed.  . fluticasone-salmeterol (ADVAIR HFA) 230-21 mcg/actuation inhaler Inhale 2 inhalations into the lungs 2 (two) times daily.  . FUROsemide (LASIX) 20 MG tablet Take 20 mg by mouth once daily.  . hyoscyamine (LEVSIN/SL) 0.125 mg SL tablet Place under the tongue as needed.  . insulin ASPART (NOVOLOG FLEXPEN) pen injector (concentration 100 units/mL) Take up to 30 units daily as directed (  Patient not taking: Reported on 12/05/2017 ) 15 mL 1  . insulin LISPRO (HUMALOG KWIKPEN) pen injector (concentration 100 units/mL) Patient is to take 2-6 units of insulin subcutaneously before meals as directed (Patient not taking: Reported on 12/05/2017  ) 15 mL 1  . ipratropium-albuteroL (COMBIVENT RESPIMAT) 20-100 mcg/actuation inhaler Inhale 1 inhalation into the lungs 4 (four) times daily as needed for Wheezing 3 Inhaler 3  . LANTUS SOLOSTAR U-100 INSULIN pen injector (concentration 100 units/mL) Inject 20 Units subcutaneously nightly 15 mL 11  . losartan (COZAAR) 100 MG tablet Take 100 mg by mouth once daily.  . metFORMIN (GLUCOPHAGE) 500 MG tablet Take 2 tablets (1,000 mg total) by mouth 2 (two) times daily 360 tablet 3  . metoprolol succinate (TOPROL XL) 50 MG XL tablet Take 25 mg by mouth 2 (two) times daily  . montelukast (SINGULAIR) 10 mg tablet Take 10 mg by mouth once daily.  . pen needle, diabetic 31 gauge x 5/16" needle Use as directed (Patient not taking: Reported on 03/02/2019 ) 100 each 12  . RABEprazole (ACIPHEX) 20 mg EC tablet Take 20 mg by mouth once daily.  Marland Kitchen TIOTROPIUM BROMIDE (SPIRIVA WITH HANDIHALER INHAL) Inhale into the lungs.  Marland Kitchen ZINC ORAL Take by mouth   No current facility-administered medications for this visit.   Social History: She reports that she quit smoking about 21 years ago. She has never used smokeless tobacco. She reports that she does not drink alcohol and does not use drugs.  Surgical History: has a past surgical history that includes egd (05/09/2012, 06/27/2007, 04/05/2003); Colonoscopy (10/01/1998); Colonoscopy (05/17/2000); Colonoscopy (02/02/2005); Colonoscopy (06/27/2007); and Colonoscopy (05/09/2012).  Family History: Her family history is not on file.  Physical Exam: There were no vitals taken for this visit. GENERAL: NAD, able to speak in complete sentences without dyspnea or cough HEENT: normocephalic, PERRL, EOMI, TM with sharp light reflex bilaterally. Normal external auditory canal. No hypertrophy of nasal turbinates. Clear mucosa in mouth without any exudates or erythema NECK: Supple, no jugular venous distension, nodes, stridor nor thyromegaly. Trachea midline.  CARDIOVASCULAR: RRR, no  murmurs, gallops, rubs RESPIRATORY: decreased air entry bilaterally  GASTROINTESTINAL: NABS, soft, non tender, non distended EXTR: No edema, homans sign or cyanosis LYMPHATIC: No nodes in neck or supraclavicular areas INTEGUMENTARY: No rashes, bruising, telantagias, sclerodactaly nor alopecia NEURO: Cranial nerves, motor, sensation intact. Normal gait  Labs & Imaging:   IMPRESSION: 1. No evidence for pulmonary embolus. 2. Extensive ground-glass airspace opacities involving all lobes of both lungs, with small bilateral pleural effusions, right greater than left. These findings are consistent with the patient's history of viral pneumonia. Pulmonary edema can have a similar appearance.  I have personally reviewed all above lab data and Imaging today and discussed pertinent findings with patient.   MedicalDecision Making:   Impression/Plan:   Post COVID19 pneumonitis - last dose of prednisone was Nov 5th - patient still on O2 and is weaning down to 2L/min - encouraged patient to use IS x10/h when resting - will have repeat CXR in 3 months  COPD grade 3 D Current inhalers: Combivent and Advair Smoking history and Cessation:  Social History   Tobacco Use  Smoking Status Former Smoker  . Quit date: 11/03/1997  . Years since quitting: 21.7  Smokeless Tobacco Never Used  Counseling given: Not Answered  PFTs: Moderate COPD FEV1 69% , will repeat pFT in 3 monts Significant Imaging and Lab findings: n Recent respiratory hospitalization Nov covid Phenotype:  GERD complaints during  visit today: n  Depression screening PHQ 2/9 last 3 flowsheet values  No flowsheet data found.  Depression Severity and Treatment Recommendations:  0-4= None  5-9= Mild / Treatment: Support, educate to call if worse; return in one month  10-14= Moderate / Treatment: Support, watchful waiting; Antidepressant or Psychotherapy  15-19= Moderately severe / Treatment: Antidepressant OR Psychotherapy  >=  20 = Major depression, severe / Antidepressant AND Psychotherapy   Patient relates understanding our goals for this visit and is agreeable to above plans.   Claudette Stapler, MD [place]  Division of Pulmonary & Critical Care Medicine   Electronically signed by Amanda Glazier, MD at 07/21/2019 3:51 PM EDT

## 2019-09-02 NOTE — Patient Instructions (Addendum)
Your procedure is scheduled on: 09-04-19 FRIDAY Report to Same Day Surgery 2nd floor medical mall [facility] Entrance-take elevator on left to 2nd floor.  Check in with surgery information desk.) To find out your arrival time please call [phone] between 1PM - 3PM 09-03-19 THURSDAY  Remember: Instructions that are not followed completely may result in serious medical risk, up to and including death, or upon the discretion of your surgeon and anesthesiologist your surgery may need to be rescheduled.    _x___ 1. Do not eat food after midnight the night before your procedure. NO GUM OR CANDY AFTER MIDNIGHT.  You may drink WATER up to 2 hours before you are scheduled to arrive at the hospital for your procedure.  Do not drink WATER within 2 hours of your scheduled arrival to the hospital.  Type 1 and type 2 diabetics should only drink water.   ____Ensure clear carbohydrate drink on the way to the hospital for bariatric patients  ____Ensure clear carbohydrate drink 3 hours before surgery.    __x__ 2. No Alcohol for 24 hours before or after surgery.   __x__3. No Smoking or e-cigarettes for 24 prior to surgery.  Do not use any chewable tobacco products for at least 6 hour prior to surgery   ____  4. Bring all medications with you on the day of surgery if instructed.    __x__ 5. Notify your doctor if there is any change in your medical condition     (cold, fever, infections).    x___6. On the morning of surgery brush your teeth with toothpaste and water.  You may rinse your mouth with mouth wash if you wish.  Do not swallow any toothpaste or mouthwash.   Do not wear jewelry, make-up, hairpins, clips or nail polish.  Do not wear lotions, powders, or perfumes.  Do not shave 48 hours prior to surgery. Men may shave face and neck.  Do not bring valuables to the hospital.    [facility] is not responsible for any belongings or valuables.               Contacts, dentures or bridgework may  not be worn into surgery.  Leave your suitcase in the car. After surgery it may be brought to your room.  For patients admitted to the hospital, discharge time is determined by your treatment team.  _  Patients discharged the day of surgery will not be allowed to drive home.  You will need someone to drive you home and stay with you the night of your procedure.    Please read over the following fact sheets that you were given:   [facility] Preparing for Surgery  _x___ TAKE THE FOLLOWING MEDICATION THE MORNING OF SURGERY WITH A SMALL SIP OF WATER. These include:  1. WELLBUTRIN (BUPROPION)  2. METOPROLOL (LOPRESSOR)  3. ACIPHEX (RABEPRAZOLE)  4.  5.  6.  ____Fleets enema or Magnesium Citrate as directed.   _x___ Use CHG Soap or sage wipes as directed on instruction sheet   _X___ Use inhalers on the day of surgery and bring to hospital day of [place]   _X___ Stop Metformin 2 days prior to surgery-LAST DOSE NOW (5-26)    _X___ Take 1/2 of usual insulin dose the night before surgery and none on the morning surgery-TAKE HALF OF YOUR LANTUS (10 UNITS) THURSDAY  NIGHT (5-27) AND NO INSULIN THE MORNING OF SURGERY  ____ Follow recommendations from  Cardiologist, Pulmonologist or PCP regarding stopping Aspirin, Coumadin, Plavix ,Eliquis, Effient, or Pradaxa, and Pletal.  X____Stop Anti-inflammatories such as Advil, Aleve, Ibuprofen, Motrin, Naproxen, Naprosyn, Goodies powders or aspirin products NOW-OK to take Tylenol   _x___ Stop supplements until after surgery-STOP YOUR VITAMIN C NOW-YOU MAY RESUME AFTER YOUR SURGERY   ____ Bring C-Pap to the hospital.

## 2019-09-03 ENCOUNTER — Other Ambulatory Visit
Admission: RE | Admit: 2019-09-03 | Discharge: 2019-09-03 | Disposition: A | Discharge status: Home / Self Care | Source: Ambulatory Visit | Attending: General Surgery | Admitting: General Surgery

## 2019-09-03 DIAGNOSIS — Z20822 Contact with and (suspected) exposure to covid-19: Secondary | ICD-10-CM | POA: Diagnosis not present

## 2019-09-03 DIAGNOSIS — Z01812 Encounter for preprocedural laboratory examination: Secondary | ICD-10-CM | POA: Insufficient documentation

## 2019-09-03 LAB — SARS CORONAVIRUS 2 (TAT 6-24 HRS): SARS Coronavirus 2: NEGATIVE

## 2019-09-04 ENCOUNTER — Encounter: Payer: Self-pay | Admitting: General Surgery

## 2019-09-04 ENCOUNTER — Ambulatory Visit: Admitting: Certified Registered"

## 2019-09-04 ENCOUNTER — Encounter
Admission: RE | Disposition: A | Discharge status: Home / Self Care | Payer: Self-pay | Source: Home / Self Care | Attending: General Surgery

## 2019-09-04 ENCOUNTER — Ambulatory Visit
Admission: RE | Admit: 2019-09-04 | Discharge: 2019-09-04 | Disposition: A | Discharge status: Home / Self Care | Attending: General Surgery | Admitting: General Surgery

## 2019-09-04 ENCOUNTER — Other Ambulatory Visit: Payer: Self-pay

## 2019-09-04 DIAGNOSIS — N6452 Nipple discharge: Secondary | ICD-10-CM | POA: Insufficient documentation

## 2019-09-04 DIAGNOSIS — I10 Essential (primary) hypertension: Secondary | ICD-10-CM | POA: Insufficient documentation

## 2019-09-04 DIAGNOSIS — Z87891 Personal history of nicotine dependence: Secondary | ICD-10-CM | POA: Diagnosis not present

## 2019-09-04 DIAGNOSIS — F329 Major depressive disorder, single episode, unspecified: Secondary | ICD-10-CM | POA: Insufficient documentation

## 2019-09-04 DIAGNOSIS — E7800 Pure hypercholesterolemia, unspecified: Secondary | ICD-10-CM | POA: Insufficient documentation

## 2019-09-04 DIAGNOSIS — D242 Benign neoplasm of left breast: Secondary | ICD-10-CM | POA: Insufficient documentation

## 2019-09-04 DIAGNOSIS — K219 Gastro-esophageal reflux disease without esophagitis: Secondary | ICD-10-CM | POA: Diagnosis not present

## 2019-09-04 DIAGNOSIS — Z794 Long term (current) use of insulin: Secondary | ICD-10-CM | POA: Diagnosis not present

## 2019-09-04 DIAGNOSIS — K5090 Crohn's disease, unspecified, without complications: Secondary | ICD-10-CM | POA: Diagnosis not present

## 2019-09-04 DIAGNOSIS — Z79899 Other long term (current) drug therapy: Secondary | ICD-10-CM | POA: Insufficient documentation

## 2019-09-04 DIAGNOSIS — J449 Chronic obstructive pulmonary disease, unspecified: Secondary | ICD-10-CM | POA: Diagnosis not present

## 2019-09-04 DIAGNOSIS — E119 Type 2 diabetes mellitus without complications: Secondary | ICD-10-CM | POA: Insufficient documentation

## 2019-09-04 DIAGNOSIS — F419 Anxiety disorder, unspecified: Secondary | ICD-10-CM | POA: Diagnosis not present

## 2019-09-04 DIAGNOSIS — M1990 Unspecified osteoarthritis, unspecified site: Secondary | ICD-10-CM | POA: Insufficient documentation

## 2019-09-04 DIAGNOSIS — Z7951 Long term (current) use of inhaled steroids: Secondary | ICD-10-CM | POA: Diagnosis not present

## 2019-09-04 HISTORY — PX: BREAST DUCTAL SYSTEM EXCISION: SHX5242

## 2019-09-04 LAB — GLUCOSE, CAPILLARY
Glucose-Capillary: 158 mg/dL — ABNORMAL HIGH (ref 70–99)
Glucose-Capillary: 152 mg/dL — ABNORMAL HIGH (ref 70–99)

## 2019-09-04 SURGERY — EXCISION DUCTAL SYSTEM BREAST
Anesthesia: General | Laterality: Left

## 2019-09-04 MED ORDER — DEXAMETHASONE SODIUM PHOSPHATE 10 MG/ML IJ SOLN
INTRAMUSCULAR | Status: DC | PRN
  Administered 2019-09-04: 5 mg via INTRAVENOUS

## 2019-09-04 MED ORDER — HYDROCODONE-ACETAMINOPHEN 5-325 MG PO TABS: 1.0000 | ORAL_TABLET | ORAL | 0 refills | Status: DC | PRN

## 2019-09-04 MED ORDER — EPHEDRINE SULFATE 50 MG/ML IJ SOLN
INTRAMUSCULAR | Status: DC | PRN
  Administered 2019-09-04 (×2): 10 mg via INTRAVENOUS

## 2019-09-04 MED ORDER — ONDANSETRON HCL 4 MG/2ML IJ SOLN
INTRAMUSCULAR | Status: AC
  Filled 2019-09-04: qty 2

## 2019-09-04 MED ORDER — ONDANSETRON HCL 4 MG/2ML IJ SOLN: 4.0000 mg | Freq: Once | INTRAMUSCULAR | Status: AC | PRN

## 2019-09-04 MED ORDER — IPRATROPIUM-ALBUTEROL 0.5-2.5 (3) MG/3ML IN SOLN: 3.0000 mL | Freq: Once | RESPIRATORY_TRACT | Status: AC

## 2019-09-04 MED ORDER — ONDANSETRON HCL 4 MG/2ML IJ SOLN
INTRAMUSCULAR | Status: DC | PRN
  Administered 2019-09-04: 4 mg via INTRAVENOUS

## 2019-09-04 MED ORDER — FENTANYL CITRATE (PF) 100 MCG/2ML IJ SOLN: 25.0000 ug | INTRAMUSCULAR | Status: DC | PRN

## 2019-09-04 MED ORDER — MIDAZOLAM HCL 2 MG/2ML IJ SOLN
INTRAMUSCULAR | Status: DC | PRN
  Administered 2019-09-04: 2 mg via INTRAVENOUS

## 2019-09-04 MED ORDER — PROPOFOL 10 MG/ML IV BOLUS
INTRAVENOUS | Status: AC
  Filled 2019-09-04: qty 20

## 2019-09-04 MED ORDER — FENTANYL CITRATE (PF) 100 MCG/2ML IJ SOLN
INTRAMUSCULAR | Status: AC
  Filled 2019-09-04: qty 2

## 2019-09-04 MED ORDER — LIDOCAINE HCL (PF) 2 % IJ SOLN
INTRAMUSCULAR | Status: AC
  Filled 2019-09-04: qty 10

## 2019-09-04 MED ORDER — PROPOFOL 10 MG/ML IV BOLUS
INTRAVENOUS | Status: DC | PRN
  Administered 2019-09-04: 150 mg via INTRAVENOUS

## 2019-09-04 MED ORDER — FENTANYL CITRATE (PF) 100 MCG/2ML IJ SOLN
INTRAMUSCULAR | Status: DC | PRN
  Administered 2019-09-04: 50 ug via INTRAVENOUS
  Administered 2019-09-04 (×2): 25 ug via INTRAVENOUS

## 2019-09-04 MED ORDER — LIDOCAINE HCL (CARDIAC) PF 100 MG/5ML IV SOSY
PREFILLED_SYRINGE | INTRAVENOUS | Status: DC | PRN
  Administered 2019-09-04: 100 mg via INTRAVENOUS

## 2019-09-04 MED ORDER — MIDAZOLAM HCL 2 MG/2ML IJ SOLN
INTRAMUSCULAR | Status: AC
  Filled 2019-09-04: qty 2

## 2019-09-04 MED ORDER — IPRATROPIUM-ALBUTEROL 0.5-2.5 (3) MG/3ML IN SOLN
RESPIRATORY_TRACT | Status: AC
  Administered 2019-09-04: 3 mL via RESPIRATORY_TRACT
  Filled 2019-09-04: qty 3

## 2019-09-04 MED ORDER — ONDANSETRON HCL 4 MG/2ML IJ SOLN
INTRAMUSCULAR | Status: AC
  Administered 2019-09-04: 4 mg via INTRAVENOUS
  Filled 2019-09-04: qty 2

## 2019-09-04 MED ORDER — CHLORHEXIDINE GLUCONATE 0.12 % MT SOLN
OROMUCOSAL | Status: AC
  Filled 2019-09-04: qty 15

## 2019-09-04 MED ORDER — ACETAMINOPHEN 10 MG/ML IV SOLN
INTRAVENOUS | Status: AC
  Filled 2019-09-04: qty 100

## 2019-09-04 MED ORDER — PHENYLEPHRINE HCL (PRESSORS) 10 MG/ML IV SOLN
INTRAVENOUS | Status: DC | PRN
  Administered 2019-09-04 (×2): 100 ug via INTRAVENOUS

## 2019-09-04 MED ORDER — DEXAMETHASONE SODIUM PHOSPHATE 10 MG/ML IJ SOLN
INTRAMUSCULAR | Status: AC
  Filled 2019-09-04: qty 1

## 2019-09-04 MED ORDER — SODIUM CHLORIDE 0.9 % IV SOLN: INTRAVENOUS | Status: DC

## 2019-09-04 MED ORDER — BUPIVACAINE-EPINEPHRINE (PF) 0.5% -1:200000 IJ SOLN
INTRAMUSCULAR | Status: DC | PRN
  Administered 2019-09-04: 30 mL

## 2019-09-04 MED ORDER — CHLORHEXIDINE GLUCONATE 0.12 % MT SOLN
15.0000 mL | Freq: Once | OROMUCOSAL | Status: AC
  Administered 2019-09-04: 15 mL via OROMUCOSAL

## 2019-09-04 MED ORDER — ACETAMINOPHEN 10 MG/ML IV SOLN
INTRAVENOUS | Status: DC | PRN
  Administered 2019-09-04: 1000 mg via INTRAVENOUS

## 2019-09-04 MED ORDER — ORAL CARE MOUTH RINSE: 15.0000 mL | Freq: Once | OROMUCOSAL | Status: AC

## 2019-09-04 MED ORDER — BUPIVACAINE-EPINEPHRINE (PF) 0.5% -1:200000 IJ SOLN
INTRAMUSCULAR | Status: AC
  Filled 2019-09-04: qty 90

## 2019-09-04 SURGICAL SUPPLY — 52 items
APL PRP STRL LF DISP 70% ISPRP (MISCELLANEOUS) ×1
BINDER BREAST LRG (GAUZE/BANDAGES/DRESSINGS) IMPLANT
BINDER BREAST MEDIUM (GAUZE/BANDAGES/DRESSINGS) IMPLANT
BINDER BREAST XLRG (GAUZE/BANDAGES/DRESSINGS) IMPLANT
BINDER BREAST XXLRG (GAUZE/BANDAGES/DRESSINGS) IMPLANT
BLADE SURG 15 STRL SS SAFETY (BLADE) ×6 IMPLANT
CANISTER SUCT 1200ML W/VALVE (MISCELLANEOUS) ×3 IMPLANT
CHLORAPREP W/TINT 26 (MISCELLANEOUS) ×3 IMPLANT
CLOSURE WOUND 1/2 X4 (GAUZE/BANDAGES/DRESSINGS) ×1
CNTNR SPEC 2.5X3XGRAD LEK (MISCELLANEOUS) ×1
CONT SPEC 4OZ STER OR WHT (MISCELLANEOUS) ×2
CONT SPEC 4OZ STRL OR WHT (MISCELLANEOUS) ×1
CONTAINER SPEC 2.5X3XGRAD LEK (MISCELLANEOUS) IMPLANT
COVER PROBE FLX POLY STRL (MISCELLANEOUS) ×3 IMPLANT
COVER WAND RF STERILE (DRAPES) ×3 IMPLANT
DEVICE DUBIN SPECIMEN MAMMOGRA (MISCELLANEOUS) IMPLANT
DRAPE LAPAROTOMY 100X77 ABD (DRAPES) ×3 IMPLANT
DRSG GAUZE FLUFF 36X18 (GAUZE/BANDAGES/DRESSINGS) ×3 IMPLANT
DRSG TEGADERM 4X4.75 (GAUZE/BANDAGES/DRESSINGS) ×2 IMPLANT
DRSG TELFA 4X3 1S NADH ST (GAUZE/BANDAGES/DRESSINGS) ×5 IMPLANT
ELECT CAUTERY BLADE TIP 2.5 (TIP) ×3
ELECT REM PT RETURN 9FT ADLT (ELECTROSURGICAL) ×3
ELECTRODE CAUTERY BLDE TIP 2.5 (TIP) ×1 IMPLANT
ELECTRODE REM PT RTRN 9FT ADLT (ELECTROSURGICAL) ×1 IMPLANT
GLOVE BIO SURGEON STRL SZ7.5 (GLOVE) ×6 IMPLANT
GLOVE INDICATOR 8.0 STRL GRN (GLOVE) ×5 IMPLANT
GOWN STRL REUS W/ TWL LRG LVL3 (GOWN DISPOSABLE) ×2 IMPLANT
GOWN STRL REUS W/TWL LRG LVL3 (GOWN DISPOSABLE) ×6
KIT TURNOVER KIT A (KITS) ×3 IMPLANT
LABEL OR SOLS (LABEL) ×3 IMPLANT
MARGIN MAP 10MM (MISCELLANEOUS) ×3 IMPLANT
NDL HYPO 25X1 1.5 SAFETY (NEEDLE) ×1 IMPLANT
NEEDLE HYPO 22GX1.5 SAFETY (NEEDLE) ×3 IMPLANT
NEEDLE HYPO 25X1 1.5 SAFETY (NEEDLE) ×3 IMPLANT
PACK BASIN MINOR (MISCELLANEOUS) ×3 IMPLANT
PUNCH BIOPSY 3 (MISCELLANEOUS) ×2 IMPLANT
RETRACTOR RING XSMALL (MISCELLANEOUS) IMPLANT
RTRCTR WOUND ALEXIS 13CM XS SH (MISCELLANEOUS)
SHEARS HARMONIC 9CM CVD (BLADE) IMPLANT
STRIP CLOSURE SKIN 1/2X4 (GAUZE/BANDAGES/DRESSINGS) ×2 IMPLANT
SUT ETHILON 3-0 FS-10 30 BLK (SUTURE) ×3
SUT PROLENE 5-0 (SUTURE) ×3
SUT PROLENE 5-0 BB 24X2 ARM (SUTURE) ×1
SUT VIC AB 2-0 CT1 27 (SUTURE) ×6
SUT VIC AB 2-0 CT1 TAPERPNT 27 (SUTURE) ×2 IMPLANT
SUT VIC AB 4-0 FS2 27 (SUTURE) ×3 IMPLANT
SUTURE EHLN 3-0 FS-10 30 BLK (SUTURE) ×1 IMPLANT
SUTURE PROLEN 5-0 BB 24X2 ARM (SUTURE) IMPLANT
SWABSTK COMLB BENZOIN TINCTURE (MISCELLANEOUS) ×3 IMPLANT
SYR 10ML LL (SYRINGE) ×3 IMPLANT
TAPE TRANSPORE STRL 2 31045 (GAUZE/BANDAGES/DRESSINGS) IMPLANT
WATER STERILE IRR 1000ML POUR (IV SOLUTION) ×3 IMPLANT

## 2019-09-04 NOTE — Anesthesia Preprocedure Evaluation (Signed)
Anesthesia Evaluation  Patient identified by MRN, date of birth, ID band Patient awake    Reviewed: Allergy & Precautions, H&P , NPO status , Patient's Chart, lab work & pertinent test results, reviewed documented beta blocker date and time   History of Anesthesia Complications (+) PROLONGED EMERGENCE and history of anesthetic complications  Airway Mallampati: I  TM Distance: >3 FB Neck ROM: full    Dental  (+) Dental Advidsory Given, Caps, Teeth Intact, Missing   Pulmonary neg shortness of breath, asthma , neg sleep apnea, pneumonia (2/2 COVID-19), unresolved, COPD, neg recent URI, former smoker,    Pulmonary exam normal breath sounds clear to auscultation       Cardiovascular Exercise Tolerance: Good hypertension, (-) angina(-) Past MI and (-) Cardiac Stents Normal cardiovascular exam(-) dysrhythmias (-) Valvular Problems/Murmurs Rhythm:regular Rate:Normal     Neuro/Psych PSYCHIATRIC DISORDERS Anxiety Depression negative neurological ROS     GI/Hepatic Neg liver ROS, GERD  ,  Endo/Other  diabetes  Renal/GU Renal disease (kidney stone)  negative genitourinary   Musculoskeletal   Abdominal   Peds  Hematology negative hematology ROS (+)   Anesthesia Other Findings Past Medical History: No date: Arthritis No date: Complication of anesthesia     Comment:  HARD TO WAKE UP No date: COPD (chronic obstructive pulmonary disease) (HCC) No date: Crohn's disease ([place]) No date: Depression No date: Diabetes mellitus ([place]) No date: Fracture, foot     Comment:  post fall No date: Hypercholesterolemia No date: Hypertension No date: IBS (irritable bowel syndrome) No date: Nephrolithiasis     Comment:  s/p lithotripsy [facility] U[facility])   Reproductive/Obstetrics negative OB ROS                             Anesthesia Physical Anesthesia Plan  ASA: III  Anesthesia Plan: General   Post-op  Pain Management:    Induction: Intravenous  PONV Risk Score and Plan: 3 and Ondansetron, Dexamethasone, Treatment may vary due to age or medical condition and Midazolam  Airway Management Planned: LMA and Oral ETT  Additional Equipment:   Intra-op Plan:   Post-operative Plan: Extubation in OR  Informed Consent: I have reviewed the patients History and Physical, chart, labs and discussed the procedure including the risks, benefits and alternatives for the proposed anesthesia with the patient or authorized representative who has indicated his/her understanding and acceptance.     Dental Advisory Given  Plan Discussed with: Anesthesiologist, CRNA and Surgeon  Anesthesia Plan Comments:         Anesthesia Quick Evaluation

## 2019-09-04 NOTE — Op Note (Signed)
Preoperative diagnosis: Recurrent left nipple drainage, past history of papilloma.  Postoperative diagnosis: Same.  Operative procedure: Resection of left retroareolar ductal tissue and nipple biopsy.  Operating surgeon: Hervey Ard, MD  Anesthesia: General by LMA, Marcaine 0.5% with 1: 200,000 units of epinephrine, 30 cc.  Estimated blood loss: Less than 5 cc.  Clinical note: This 64 year old woman has had nipple drainage and underwent a vacuum biopsy of a small papilloma several years ago.  She had developed a 2-3 mm blue area of discoloration on the tip of the left nipple and it was elected to resect the retroareolar tissue as well as this abnormal nipple tissue.  The patient underwent general anesthesia and tolerated this well.  The breast was cleansed with ChloraPrep and draped.  Marcaine was infiltrated in the periphery to 2-3 cm away from the edge of the areola for comfort.  A circumareolar incision was made from the 12 to 6 o'clock position.  This was completed after cannulating the draining duct with a lacrimal duct probe.  The skin was incised sharply and the remaining dissection completed electrocautery.  The skin and adipose layer of the tissue behind the areola was lifted circumferentially to expose the central duct structures.  At this time the lacrimal duct probe was removed and a 3 mm punch biopsy used to remove the involved duct.  The ductal structures were then dissected free from the subcutaneous fat with electrocautery.  A 3 x 3 x 7 cm block of tissue from the central portion of the breast was resected, orientated and sent fresh to pathology per protocol.  There was a 6 mm area of skin injury on the 9 o'clock position at the base of the nipple from the cautery.  This was repaired with interrupted 4-0 Vicryl subcuticular sutures and 2-5-0 Prolene skin sutures.  The deep tissue was then approximated with interrupted 2-0 Vicryl figure-of-eight sutures to reconstruct the breast  mound.  The underlying fat was approximated with similar sutures.  The skin was closed with a running 4-0 Vicryl subcuticular suture.  Benzoin and Steri-Strips were applied.  Telfa was stacked to avoid compression on the nipple followed by a Tegaderm dressing.  The patient tolerated the procedure well and was taken recovery in stable condition.

## 2019-09-04 NOTE — Discharge Instructions (Signed)
AMBULATORY SURGERY  °DISCHARGE INSTRUCTIONS ° ° °1) The drugs that you were given will stay in your system until tomorrow so for the next 24 hours you should not: ° °A) Drive an automobile °B) Make any legal decisions °C) Drink any alcoholic beverage ° ° °2) You may resume regular meals tomorrow.  Today it is better to start with liquids and gradually work up to solid foods. ° °You may eat anything you prefer, but it is better to start with liquids, then soup and crackers, and gradually work up to solid foods. ° ° °3) Please notify your doctor immediately if you have any unusual bleeding, trouble breathing, redness and pain at the surgery site, drainage, fever, or pain not relieved by medication. ° ° ° °4) Additional Instructions: ° ° ° ° ° ° ° °Please contact your physician with any problems or Same Day Surgery at [phone], Monday through Friday 6 am to 4 pm, or [place] at [place] Main number at [phone]. °

## 2019-09-04 NOTE — Transfer of Care (Signed)
Immediate Anesthesia Transfer of Care Note  Patient: Amanda Wiley  Procedure(s) Performed: EXCISION DUCTAL SYSTEM BREAST (Left )  Patient Location: PACU  Anesthesia Type:General  Level of Consciousness: sedated  Airway & Oxygen Therapy: Patient Spontanous Breathing and Patient connected to face mask oxygen  Post-op Assessment: Report given to RN and Post -op Vital signs reviewed and stable  Post vital signs: Reviewed and stable  Last Vitals:  Vitals Value Taken Time  BP 156/74 09/04/19 1700  Temp 36.7 C 09/04/19 1700  Pulse 82 09/04/19 1700  Resp 17 09/04/19 1700  SpO2 95 % 09/04/19 1700    Last Pain:  Vitals:   09/04/19 1700  TempSrc:   PainSc: 0-No pain         Complications: No apparent anesthesia complications

## 2019-09-04 NOTE — Anesthesia Procedure Notes (Signed)
Procedure Name: LMA Insertion Date/Time: 09/04/2019 1:58 PM Performed by: Johnna Acosta, CRNA Pre-anesthesia Checklist: Patient identified, Emergency Drugs available, Suction available, Patient being monitored and Timeout performed Patient Re-evaluated:Patient Re-evaluated prior to induction Oxygen Delivery Method: Circle system utilized Preoxygenation: Pre-oxygenation with 100% oxygen Induction Type: IV induction LMA: LMA inserted LMA Size: 5.0 Tube type: Oral Number of attempts: 1 Placement Confirmation: positive ETCO2 and breath sounds checked- equal and bilateral Tube secured with: Tape Dental Injury: Teeth and Oropharynx as per pre-operative assessment

## 2019-09-04 NOTE — H&P (Signed)
Amanda Wiley [id number] 1955/11/18     HPI:  64 y/o with ductal drainage.  MRI reviewed. Plan for ductal excision.   Medications Prior to Admission  Medication Sig Dispense Refill Last Dose  . acetaminophen (TYLENOL) 325 MG tablet Take 2 tablets (650 mg total) by mouth every 6 (six) hours as needed for mild pain (or Fever >/= 101).   09/03/2019 at Unknown time  . buPROPion (WELLBUTRIN XL) 300 MG 24 hr tablet Take 1 tablet (300 mg total) by mouth daily. (Patient taking differently: Take 300 mg by mouth every morning. ) 30 tablet 0 09/03/2019 at Unknown time  . Dulaglutide (TRULICITY) 1.5 NG/2.9BM SOPN Inject 1.5 mg into the skin once a week. ON SUNDAYS   09/03/2019 at Unknown time  . fluticasone (FLONASE) 50 MCG/ACT nasal spray Place 1 spray into both nostrils as needed.    09/03/2019 at Unknown time  . fluticasone-salmeterol (ADVAIR HFA) 230-21 MCG/ACT inhaler Inhale 2 puffs into the lungs 2 (two) times daily.   09/04/2019 at Unknown time  . insulin glargine (LANTUS) 100 UNIT/ML injection Inject 20 Units into the skin at bedtime.    09/03/2019 at Unknown time  . Ipratropium-Albuterol (COMBIVENT) 20-100 MCG/ACT AERS respimat Inhale 2 puffs into the lungs every 4 (four) hours while awake. (Patient taking differently: Inhale 2 puffs into the lungs in the morning and at bedtime. ) 4 g 2 09/04/2019 at Unknown time  . JARDIANCE 25 MG TABS tablet Take 25 mg by mouth daily.   09/03/2019 at Unknown time  . metFORMIN (GLUCOPHAGE) 500 MG tablet Take 1,000 mg by mouth 2 (two) times daily.   09/03/2019 at Unknown time  . metoprolol tartrate (LOPRESSOR) 50 MG tablet Take 25 mg by mouth 2 (two) times daily.   09/03/2019 at Unknown time  . RABEprazole (ACIPHEX) 20 MG tablet Take 20 mg by mouth in the morning and at bedtime.    09/03/2019 at Unknown time  . ULTRACARE PEN NEEDLES 32G X 4 MM MISC    09/03/2019 at Unknown time  . vitamin C (VITAMIN C) 1000 MG tablet Take 1 tablet (1,000 mg total) by mouth daily.   09/03/2019  at Unknown time  . fluconazole (DIFLUCAN) 150 MG tablet Take one tablet by mouth for one dose. MAY REPEAT X1 IF PERSISTENT SYMPTOMS AFTER 3 DAYS (Patient not taking: Reported on 08/31/2019) 2 tablet 0 Not Taking at Unknown time  . LORazepam (ATIVAN) 0.5 MG tablet Take 1/2 tablet prior to MRI.  May repeat x 1 prior to MRI if needed. (Patient not taking: Reported on 08/31/2019) 2 tablet 0 Not Taking at Unknown time  . metoprolol tartrate (LOPRESSOR) 25 MG tablet Take 1 tablet (25 mg total) by mouth 2 (two) times daily. (Patient not taking: Reported on 08/31/2019) 50 tablet 0 Not Taking at Unknown time   Allergies  Allergen Reactions  . Avelox [Moxifloxacin Hcl In Nacl] Hives, Shortness Of Breath and Other (See Comments)  . Moxifloxacin Hives and Shortness Of Breath  . Allegra [Fexofenadine Hcl] Other (See Comments)    headaches  . Ibuprofen Swelling    FACIAL  . Sulfa Antibiotics Other (See Comments)    Difficulty breathing, rash, tongue swelling  . Tegretol [Carbamazepine] Other (See Comments)    Other reaction(s): Other (See Comments) Vomiting Vomiting   . Iodine Rash  . Ioxaglate Rash  . Ivp Dye [Iodinated Diagnostic Agents] Rash   Past Medical History:  Diagnosis Date  . Arthritis   . Complication of anesthesia  HARD TO WAKE UP  . COPD (chronic obstructive pulmonary disease) ([place])   . Crohn's disease ([place])   . Depression   . Diabetes mellitus ([place])   . Fracture, foot    post fall  . Hypercholesterolemia   . Hypertension   . IBS (irritable bowel syndrome)   . Nephrolithiasis    s/p lithotripsy [facility])   Past Surgical History:  Procedure Laterality Date  . ABDOMINAL HYSTERECTOMY  1985  . APPENDECTOMY  1985  . BREAST EXCISIONAL BIOPSY Right 2013   benign  . COLONOSCOPY  2014  . EXCISION / BIOPSY BREAST / NIPPLE / DUCT Left 2019   papilloma  . EXCISION OF BREAST BIOPSY Left 2019   papilloma  . LAPAROSCOPIC CHOLECYSTECTOMY  2005   Dr Tamala Julian  .  LAPAROSCOPIC SUPRACERVICAL HYSTERECTOMY  1985   left ovary not removed  . LITHOTRIPSY     x7  . OOPHORECTOMY    . POLYPECTOMY    . SHOULDER SURGERY     right (pinning)  . SHOULDER SURGERY     left (pinning)  . TONSILLECTOMY  1966   Social History   Socioeconomic History  . Marital status: Married    Spouse name: Not on file  . Number of children: 2  . Years of education: Not on file  . Highest education level: Not on file  Occupational History    Employer: [employer]  Tobacco Use  . Smoking status: Former Smoker    Packs/day: 2.00    Years: 15.00    Pack years: 30.00    Quit date: 04/09/1997    Years since quitting: 22.4  . Smokeless tobacco: Never Used  Substance and Sexual Activity  . Alcohol use: Never    Alcohol/week: 0.0 standard drinks  . Drug use: No  . Sexual activity: Not on file  Other Topics Concern  . Not on file  Social History Narrative  . Not on file   Social Determinants of Health   Financial Resource Strain:   . Difficulty of Paying Living Expenses:   Food Insecurity:   . Worried About Charity fundraiser in the Last Year:   . Arboriculturist in the Last Year:   Transportation Needs:   . Film/video editor (Medical):   Marland Kitchen Lack of Transportation (Non-Medical):   Physical Activity:   . Days of Exercise per Week:   . Minutes of Exercise per Session:   Stress:   . Feeling of Stress :   Social Connections:   . Frequency of Communication with Friends and Family:   . Frequency of Social Gatherings with Friends and Family:   . Attends Religious Services:   . Active Member of Clubs or Organizations:   . Attends Archivist Meetings:   Marland Kitchen Marital Status:   Intimate Partner Violence:   . Fear of Current or Ex-Partner:   . Emotionally Abused:   Marland Kitchen Physically Abused:   . Sexually Abused:    Social History   Social History Narrative  . Not on file     ROS: Negative.     PE: HEENT: Negative. Lungs: Clear. Cardio:  RR.  Assessment/Plan:  Proceed with planned excision of retro-areolar ductal structures.    Forest Gleason [facility] 09/04/2019

## 2019-09-09 LAB — SURGICAL PATHOLOGY

## 2019-09-09 NOTE — Anesthesia Postprocedure Evaluation (Signed)
Anesthesia Post Note  Patient: Amanda Wiley  Procedure(s) Performed: EXCISION DUCTAL SYSTEM BREAST (Left )  Patient location during evaluation: PACU Anesthesia Type: General Level of consciousness: awake and alert Pain management: pain level controlled Vital Signs Assessment: post-procedure vital signs reviewed and stable Respiratory status: spontaneous breathing, nonlabored ventilation, respiratory function stable and patient connected to nasal cannula oxygen Cardiovascular status: blood pressure returned to baseline and stable Postop Assessment: no apparent nausea or vomiting Anesthetic complications: no     Last Vitals:  Vitals:   09/04/19 1616 09/04/19 1700  BP: (!) 178/79 (!) 156/74  Pulse: 80 82  Resp: 16 17  Temp:  36.7 C  SpO2: 92% 95%    Last Pain:  Vitals:   09/08/19 0824  TempSrc:   PainSc: 3                  Martha Clan

## 2019-10-14 ENCOUNTER — Other Ambulatory Visit (INDEPENDENT_AMBULATORY_CARE_PROVIDER_SITE_OTHER)

## 2019-10-14 ENCOUNTER — Other Ambulatory Visit: Payer: Self-pay

## 2019-10-14 DIAGNOSIS — I10 Essential (primary) hypertension: Secondary | ICD-10-CM

## 2019-10-14 DIAGNOSIS — E7800 Pure hypercholesterolemia, unspecified: Secondary | ICD-10-CM

## 2019-10-14 DIAGNOSIS — E1165 Type 2 diabetes mellitus with hyperglycemia: Secondary | ICD-10-CM | POA: Diagnosis not present

## 2019-10-14 LAB — CBC WITH DIFFERENTIAL/PLATELET
Basophils Absolute: 0.1 10*3/uL (ref 0.0–0.1)
Basophils Relative: 1.2 % (ref 0.0–3.0)
Eosinophils Absolute: 0.4 10*3/uL (ref 0.0–0.7)
Eosinophils Relative: 4.5 % (ref 0.0–5.0)
HCT: 46.8 % — ABNORMAL HIGH (ref 36.0–46.0)
Hemoglobin: 15.3 g/dL — ABNORMAL HIGH (ref 12.0–15.0)
Lymphocytes Relative: 26 % (ref 12.0–46.0)
Lymphs Abs: 2.1 10*3/uL (ref 0.7–4.0)
MCHC: 32.7 g/dL (ref 30.0–36.0)
MCV: 84.6 fl (ref 78.0–100.0)
Monocytes Absolute: 0.9 10*3/uL (ref 0.1–1.0)
Monocytes Relative: 11.2 % (ref 3.0–12.0)
Neutro Abs: 4.5 10*3/uL (ref 1.4–7.7)
Neutrophils Relative %: 57.1 % (ref 43.0–77.0)
Platelets: 172 10*3/uL (ref 150.0–400.0)
RBC: 5.53 Mil/uL — ABNORMAL HIGH (ref 3.87–5.11)
RDW: 17.1 % — ABNORMAL HIGH (ref 11.5–15.5)
WBC: 7.9 10*3/uL (ref 4.0–10.5)

## 2019-10-14 LAB — HEPATIC FUNCTION PANEL
ALT: 25 U/L (ref 0–35)
AST: 19 U/L (ref 0–37)
Albumin: 4.2 g/dL (ref 3.5–5.2)
Alkaline Phosphatase: 65 U/L (ref 39–117)
Bilirubin, Direct: 0.1 mg/dL (ref 0.0–0.3)
Total Bilirubin: 0.4 mg/dL (ref 0.2–1.2)
Total Protein: 7.2 g/dL (ref 6.0–8.3)

## 2019-10-14 LAB — LIPID PANEL
Cholesterol: 210 mg/dL — ABNORMAL HIGH (ref 0–200)
HDL: 41.2 mg/dL (ref >39.00)
NonHDL: 168.93
Total CHOL/HDL Ratio: 5
Triglycerides: 324 mg/dL — ABNORMAL HIGH (ref 0.0–149.0)
VLDL: 64.8 mg/dL — ABNORMAL HIGH (ref 0.0–40.0)

## 2019-10-14 LAB — BASIC METABOLIC PANEL
BUN: 20 mg/dL (ref 6–23)
CO2: 22 mEq/L (ref 19–32)
Calcium: 9.6 mg/dL (ref 8.4–10.5)
Chloride: 104 mEq/L (ref 96–112)
Creatinine, Ser: 0.71 mg/dL (ref 0.40–1.20)
GFR: 82.69 mL/min (ref >60.00)
Glucose, Bld: 136 mg/dL — ABNORMAL HIGH (ref 70–99)
Potassium: 4.4 mEq/L (ref 3.5–5.1)
Sodium: 138 mEq/L (ref 135–145)

## 2019-10-14 LAB — LDL CHOLESTEROL, DIRECT: Direct LDL: 128 mg/dL

## 2019-10-14 LAB — HEMOGLOBIN A1C: Hgb A1c MFr Bld: 7.6 % — ABNORMAL HIGH (ref 4.6–6.5)

## 2019-10-16 ENCOUNTER — Ambulatory Visit (INDEPENDENT_AMBULATORY_CARE_PROVIDER_SITE_OTHER): Admitting: Internal Medicine

## 2019-10-16 ENCOUNTER — Other Ambulatory Visit: Payer: Self-pay

## 2019-10-16 DIAGNOSIS — I10 Essential (primary) hypertension: Secondary | ICD-10-CM

## 2019-10-16 DIAGNOSIS — E7800 Pure hypercholesterolemia, unspecified: Secondary | ICD-10-CM

## 2019-10-16 DIAGNOSIS — K219 Gastro-esophageal reflux disease without esophagitis: Secondary | ICD-10-CM

## 2019-10-16 DIAGNOSIS — D242 Benign neoplasm of left breast: Secondary | ICD-10-CM | POA: Diagnosis not present

## 2019-10-16 DIAGNOSIS — E669 Obesity, unspecified: Secondary | ICD-10-CM

## 2019-10-16 DIAGNOSIS — F320 Major depressive disorder, single episode, mild: Secondary | ICD-10-CM

## 2019-10-16 DIAGNOSIS — D45 Polycythemia vera: Secondary | ICD-10-CM | POA: Diagnosis not present

## 2019-10-16 DIAGNOSIS — F32A Depression, unspecified: Secondary | ICD-10-CM

## 2019-10-16 DIAGNOSIS — E1169 Type 2 diabetes mellitus with other specified complication: Secondary | ICD-10-CM

## 2019-10-16 DIAGNOSIS — E1159 Type 2 diabetes mellitus with other circulatory complications: Secondary | ICD-10-CM

## 2019-10-16 DIAGNOSIS — E559 Vitamin D deficiency, unspecified: Secondary | ICD-10-CM | POA: Diagnosis not present

## 2019-10-16 DIAGNOSIS — E1165 Type 2 diabetes mellitus with hyperglycemia: Secondary | ICD-10-CM

## 2019-10-16 DIAGNOSIS — K5090 Crohn's disease, unspecified, without complications: Secondary | ICD-10-CM

## 2019-10-16 NOTE — Progress Notes (Signed)
[name], female   DOB: [birth year], 64 y.o.   MRN: [id number]   Subjective:    [name], female    DOB: Sep 01, 1955, 64 y.o.   MRN: [id number]  HPI This visit occurred during the SARS-CoV-2 public health emergency.  Safety protocols were in place, including screening questions prior to the visit, additional usage of staff PPE, and extensive cleaning of exam room while observing appropriate contact time as indicated for disinfecting solutions.  Patient here for a scheduled follow up.  She reports she is doing relatively well.  Breathing ok.  Previous covid infection.  See previous notes.  Has done well.  No chest pain.  No acid reflux reported.  No abdominal pain or bowel change reported.  Discussed labs.  a1c elevated.  Discussed low carb diet and exercise.  She plans to get more serious about her diet.  Due to see Dr Gabriel Carina 01/2020.  Will forward labs.  Increased stress.  Overall handling things relatively well.  Has good support.  Does not feel needs any further intervention at this time.   Past Medical History:  Diagnosis Date  . Arthritis   . Complication of anesthesia    HARD TO WAKE UP  . COPD (chronic obstructive pulmonary disease) ([place])   . Crohn's disease ([place])   . Depression   . Diabetes mellitus ([place])   . Fracture, foot    post fall  . Hypercholesterolemia   . Hypertension   . IBS (irritable bowel syndrome)   . Nephrolithiasis    s/p lithotripsy [facility])   Past Surgical History:  Procedure Laterality Date  . ABDOMINAL HYSTERECTOMY  1985  . APPENDECTOMY  1985  . BREAST DUCTAL SYSTEM EXCISION Left 09/04/2019   Procedure: EXCISION DUCTAL SYSTEM BREAST;  Surgeon: Robert Bellow, MD;  Location: [facility] ORS;  Service: General;  Laterality: Left;  . BREAST EXCISIONAL BIOPSY Right 2013   benign  . COLONOSCOPY  2014  . EXCISION / BIOPSY BREAST / NIPPLE / DUCT Left 2019   papilloma  . EXCISION OF BREAST BIOPSY Left 2019    papilloma  . LAPAROSCOPIC CHOLECYSTECTOMY  2005   Dr Tamala Julian  . LAPAROSCOPIC SUPRACERVICAL HYSTERECTOMY  1985   left ovary not removed  . LITHOTRIPSY     x7  . OOPHORECTOMY    . POLYPECTOMY    . SHOULDER SURGERY     right (pinning)  . SHOULDER SURGERY     left (pinning)  . TONSILLECTOMY  1966   Family History  Problem Relation Age of Onset  . Heart disease Father   . Emphysema Maternal Grandmother   . COPD Mother   . Hypercholesterolemia Mother   . Emphysema Mother   . Hypertension Brother   . Emphysema Other        grandmother  . COPD Sister   . Crohn's disease Sister   . Hypertension Sister   . Breast cancer Neg Hx   . Colon cancer Neg Hx    Social History   Socioeconomic History  . Marital status: Married    Spouse name: Not on file  . Number of children: 2  . Years of education: Not on file  . Highest education level: Not on file  Occupational History    Employer: [employer]  Tobacco Use  . Smoking status: Former Smoker    Packs/day: 2.00    Years: 15.00    Pack years: 30.00    Quit date: 04/09/1997  Years since quitting: 22.5  . Smokeless tobacco: Never Used  Vaping Use  . Vaping Use: Never used  Substance and Sexual Activity  . Alcohol use: Never    Alcohol/week: 0.0 standard drinks  . Drug use: No  . Sexual activity: Not on file  Other Topics Concern  . Not on file  Social History Narrative  . Not on file   Social Determinants of Health   Financial Resource Strain:   . Difficulty of Paying Living Expenses:   Food Insecurity:   . Worried About Charity fundraiser in the Last Year:   . Arboriculturist in the Last Year:   Transportation Needs:   . Film/video editor (Medical):   Marland Kitchen Lack of Transportation (Non-Medical):   Physical Activity:   . Days of Exercise per Week:   . Minutes of Exercise per Session:   Stress:   . Feeling of Stress :   Social Connections:   . Frequency of Communication with Friends and Family:   . Frequency  of Social Gatherings with Friends and Family:   . Attends Religious Services:   . Active Member of Clubs or Organizations:   . Attends Archivist Meetings:   Marland Kitchen Marital Status:     Outpatient Encounter Medications as of 10/16/2019  Medication Sig  . acetaminophen (TYLENOL) 325 MG tablet Take 2 tablets (650 mg total) by mouth every 6 (six) hours as needed for mild pain (or Fever >/= 101).  Marland Kitchen buPROPion (WELLBUTRIN XL) 300 MG 24 hr tablet Take 1 tablet (300 mg total) by mouth daily. (Patient taking differently: Take 300 mg by mouth every morning. )  . Dulaglutide (TRULICITY) 1.5 DT/2.6ZT SOPN Inject 1.5 mg into the skin once a week. ON SUNDAYS  . fluconazole (DIFLUCAN) 150 MG tablet Take one tablet by mouth for one dose. MAY REPEAT X1 IF PERSISTENT SYMPTOMS AFTER 3 DAYS (Patient not taking: Reported on 08/31/2019)  . fluticasone (FLONASE) 50 MCG/ACT nasal spray Place 1 spray into both nostrils as needed.   . fluticasone-salmeterol (ADVAIR HFA) 230-21 MCG/ACT inhaler Inhale 2 puffs into the lungs 2 (two) times daily.  . insulin glargine (LANTUS) 100 UNIT/ML injection Inject 20 Units into the skin at bedtime.   . Ipratropium-Albuterol (COMBIVENT) 20-100 MCG/ACT AERS respimat Inhale 2 puffs into the lungs every 4 (four) hours while awake. (Patient taking differently: Inhale 2 puffs into the lungs in the morning and at bedtime. )  . JARDIANCE 25 MG TABS tablet Take 25 mg by mouth daily.  Marland Kitchen LORazepam (ATIVAN) 0.5 MG tablet Take 1/2 tablet prior to MRI.  May repeat x 1 prior to MRI if needed. (Patient not taking: Reported on 08/31/2019)  . metFORMIN (GLUCOPHAGE) 500 MG tablet Take 1,000 mg by mouth 2 (two) times daily.  . metoprolol tartrate (LOPRESSOR) 25 MG tablet Take 1 tablet (25 mg total) by mouth 2 (two) times daily. (Patient not taking: Reported on 08/31/2019)  . RABEprazole (ACIPHEX) 20 MG tablet Take 20 mg by mouth in the morning and at bedtime.   Marland Kitchen ULTRACARE PEN NEEDLES 32G X 4 MM MISC     . vitamin C (VITAMIN C) 1000 MG tablet Take 1 tablet (1,000 mg total) by mouth daily.  . [DISCONTINUED] HYDROcodone-acetaminophen (NORCO/VICODIN) 5-325 MG tablet Take 1 tablet by mouth every 4 (four) hours as needed for moderate pain.  . [DISCONTINUED] metoprolol tartrate (LOPRESSOR) 50 MG tablet Take 25 mg by mouth 2 (two) times daily.   No facility-administered  encounter medications on file as of 10/16/2019.    Review of Systems  Constitutional: Negative for appetite change and unexpected weight change.  HENT: Negative for congestion and sinus pressure.   Respiratory: Negative for cough, chest tightness and shortness of breath.   Cardiovascular: Negative for chest pain, palpitations and leg swelling.  Gastrointestinal: Negative for abdominal pain, diarrhea, nausea and vomiting.  Genitourinary: Negative for difficulty urinating and dysuria.  Musculoskeletal: Negative for joint swelling and myalgias.  Skin: Negative for color change and rash.  Neurological: Negative for dizziness, light-headedness and headaches.  Psychiatric/Behavioral: Negative for agitation and dysphoric mood.       Objective:    Physical Exam Vitals reviewed.  Constitutional:      General: She is not in acute distress.    Appearance: Normal appearance.  HENT:     Head: Normocephalic and atraumatic.     Right Ear: External ear normal.     Left Ear: External ear normal.  Eyes:     General: No scleral icterus.       Right eye: No discharge.        Left eye: No discharge.     Conjunctiva/sclera: Conjunctivae normal.  Neck:     Thyroid: No thyromegaly.  Cardiovascular:     Rate and Rhythm: Normal rate and regular rhythm.  Pulmonary:     Effort: No respiratory distress.     Breath sounds: Normal breath sounds. No wheezing.  Abdominal:     General: Bowel sounds are normal.     Palpations: Abdomen is soft.     Tenderness: There is no abdominal tenderness.  Musculoskeletal:        General: No swelling or  tenderness.     Cervical back: Neck supple. No tenderness.  Lymphadenopathy:     Cervical: No cervical adenopathy.  Skin:    Findings: No erythema or rash.  Neurological:     Mental Status: She is alert.  Psychiatric:        Mood and Affect: Mood normal.        Behavior: Behavior normal.     BP 122/64   Pulse 83   Temp (!) 97.4 F (36.3 C)   Resp 16   Ht 5' 6"  (1.676 m)   Wt 220 lb (99.8 kg)   SpO2 95%   BMI 35.51 kg/m  Wt Readings from Last 3 Encounters:  10/16/19 220 lb (99.8 kg)  09/04/19 220 lb (99.8 kg)  06/16/19 214 lb (97.1 kg)     Lab Results  Component Value Date   WBC 7.9 10/14/2019   HGB 15.3 (H) 10/14/2019   HCT 46.8 (H) 10/14/2019   PLT 172.0 10/14/2019   GLUCOSE 136 (H) 10/14/2019   CHOL 210 (H) 10/14/2019   TRIG 324.0 (H) 10/14/2019   HDL 41.20 10/14/2019   LDLDIRECT 128.0 10/14/2019   LDLCALC 148 (H) 07/24/2013   ALT 25 10/14/2019   AST 19 10/14/2019   NA 138 10/14/2019   K 4.4 10/14/2019   CL 104 10/14/2019   CREATININE 0.71 10/14/2019   BUN 20 10/14/2019   CO2 22 10/14/2019   TSH 2.15 06/02/2019   INR 1.1 02/02/2019   HGBA1C 7.6 (H) 10/14/2019   MICROALBUR 0.7 06/02/2019       Assessment & Plan:   Problem List Items Addressed This Visit    Crohn's disease ([place])    Bowels stable.  Continue f/u with GI.       Diabetes mellitus ([place])    Followed  by Dr Gabriel Carina.  a1c just checked 7.6.  Low carb diet and exercise.  She plans to get back in her routine with her diet.  Forward labs to Dr Gabriel Carina.        Relevant Orders   Hemoglobin A1c   GERD (gastroesophageal reflux disease)    Upper symptoms controlled on aciphex.        Hypercholesterolemia without hypertriglyceridemia    Off crestor.  Low cholesterol diet and exercise.  Desires not to restart cholesterol medication.  Follow lipid panel.       Relevant Orders   Hepatic function panel   Lipid panel   Hypertension    Blood pressure as outlined.  Continue metoprolol.  Follow  pressures.  Follow metabolic panel.       Relevant Orders   Basic metabolic panel   Mild depression ([place])    Discussed increased stress with her today.  On wellbutrin. Does not feel needs any further intervention at this time.  Follow.        Obesity, diabetes, and hypertension syndrome ([facility])    Discussed diet, exercise and weight loss.  Continue jardiance and trulicity.        Papilloma of left breast    Saw Dr Bary Castilla.  S/p resection of left retro-areolar ductal structures on 09/04/19.  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        Polycythemia vera ([place])    Has seen hematology.  Released.  Follow cbc.       Relevant Orders   CBC with Differential/Platelet   Vitamin D deficiency    Follow vitamin D level.           Einar Pheasant, MD

## 2019-10-17 ENCOUNTER — Encounter: Payer: Self-pay | Admitting: Internal Medicine

## 2019-10-17 NOTE — Assessment & Plan Note (Signed)
Saw Dr Bary Castilla.  S/p resection of left retro-areolar ductal structures on 09/04/19.  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

## 2019-10-17 NOTE — Assessment & Plan Note (Signed)
Followed by Dr Gabriel Carina.  a1c just checked 7.6.  Low carb diet and exercise.  She plans to get back in her routine with her diet.  Forward labs to Dr Gabriel Carina.

## 2019-10-17 NOTE — Assessment & Plan Note (Signed)
Off crestor.  Low cholesterol diet and exercise.  Desires not to restart cholesterol medication.  Follow lipid panel.

## 2019-10-17 NOTE — Assessment & Plan Note (Signed)
Discussed increased stress with her today.  On wellbutrin. Does not feel needs any further intervention at this time.  Follow.

## 2019-10-17 NOTE — Assessment & Plan Note (Signed)
Has seen hematology.  Released.  Follow cbc.

## 2019-10-17 NOTE — Assessment & Plan Note (Signed)
Follow vitamin D level.

## 2019-10-17 NOTE — Assessment & Plan Note (Signed)
Bowels stable.  Continue f/u with GI.

## 2019-10-17 NOTE — Assessment & Plan Note (Signed)
Upper symptoms controlled on aciphex.

## 2019-10-17 NOTE — Assessment & Plan Note (Signed)
Discussed diet, exercise and weight loss.  Continue jardiance and trulicity.

## 2019-10-17 NOTE — Assessment & Plan Note (Signed)
Blood pressure as outlined.  Continue metoprolol.  Follow pressures.  Follow metabolic panel.

## 2019-11-13 ENCOUNTER — Telehealth: Payer: Self-pay | Admitting: Internal Medicine

## 2019-11-13 DIAGNOSIS — Z1211 Encounter for screening for malignant neoplasm of colon: Secondary | ICD-10-CM

## 2019-11-13 DIAGNOSIS — E7800 Pure hypercholesterolemia, unspecified: Secondary | ICD-10-CM

## 2019-11-13 MED ORDER — FLUCONAZOLE 150 MG PO TABS: ORAL_TABLET | ORAL | 0 refills | Status: DC

## 2019-11-13 NOTE — Telephone Encounter (Signed)
Pt uses prn for frequent yeast infections. Okay to send in for her? No other acute symptoms noted.

## 2019-11-13 NOTE — Telephone Encounter (Signed)
Patient aware. Also stated she is supposed to be on a new cholesterol medication and have a referral for a colonoscopy with Dr Bary Castilla.

## 2019-11-13 NOTE — Addendum Note (Signed)
Addended by: Alisa Graff on: 11/13/2019 03:38 PM   Modules accepted: Orders

## 2019-11-13 NOTE — Telephone Encounter (Signed)
rx sent in for diflucan - for acute flare.  If persistent symptoms, will need to be evaluated.

## 2019-11-13 NOTE — Telephone Encounter (Signed)
Pt needs a refill on fluconazole (DIFLUCAN) 150 MG tablet sent to [facility] She has a yeast infection

## 2019-11-14 NOTE — Addendum Note (Signed)
Addended by: Alisa Graff on: 11/14/2019 08:47 AM   Modules accepted: Orders

## 2019-11-14 NOTE — Telephone Encounter (Signed)
I have placed the order for the referral to Dr Bary Castilla.  Someone should contact her with an appt date and time.  Also, confirm she is agreeable to starting crestor.  We can start crestor 73m q Monday, Wednesday and Friday.  Will need liver panel checked 6 weeks prior to starting.  If she does agree, will need to let lab know that the next lab draw I will only need to liver panel.  I have ordered.  The other future labs are to be done at her 02/12/20 lab appt.  Thanks.

## 2019-11-16 NOTE — Telephone Encounter (Signed)
Spoke with pt and she stated that she spoke with Dr. Dwyane Luo office this morning. She also stated that she wants to hold off on the cholesterol medication for now.

## 2019-11-24 ENCOUNTER — Other Ambulatory Visit: Payer: Self-pay | Admitting: General Surgery

## 2019-11-24 NOTE — Progress Notes (Signed)
Subjective:     [name] is a 64 y.o. female.  HPI  The following portions of the patient's history were reviewed and updated as appropriate.  This an established patient is here today for: office visit. Patient is here today to follow up from a left breast duct excision done on 09-04-19. No complaints.   She has appreciated that she is numb over the nipple/areolar complex.  Review of Systems  Constitutional: Negative for chills and fever.  Respiratory: Negative for cough and chest tightness.          Chief Complaint  Patient presents with  . Post Operative Visit    left breast duct excision done 09-04-19      BP 166/88   Pulse 78   Temp 36.5 C (97.7 F)   Ht 165.1 cm (5' 5" )   Wt 99.8 kg (220 lb)   LMP 04/10/1983   SpO2 93%   BMI 36.61 kg/m       Past Medical History:  Diagnosis Date  . COPD (chronic obstructive pulmonary disease) (CMS-HCC)   . COVID-19   . Crohn's disease (CMS-HCC)   . Depressive disorder   . Fracture of foot   . Hypercholesteremia   . Hyperlipidemia   . Hypertension   . Irritable bowel syndrome   . Nephrolithiasis   . Obesity   . Pneumonia due to COVID-19 virus 01/2019  . Type 2 diabetes mellitus (CMS-HCC)           Past Surgical History:  Procedure Laterality Date  . APPENDECTOMY  1985  . Biopsy of breast Left 2019   papilloma  . Biopsy of breast Right 2013  . CHOLECYSTECTOMY  2005  . COLONOSCOPY  10/01/1998   Adenomatous Polyp  . COLONOSCOPY  05/17/2000   PH Adenomatous Polyp  . COLONOSCOPY  02/02/2005   Adenomatous Polyps  . COLONOSCOPY  06/27/2007   PH Adenomatous Polyps  . COLONOSCOPY  05/09/2012   Adenomatous Polyp: CF 04/2017; Recall Ltr mailed 03/26/2017 (dw)  . EGD  05/09/2012, 06/27/2007, 04/05/2003  . History of operative procedure on shoulder Bilateral   . HYSTERECTOMY  1985  . LITHOTRIPSY    . OOPHORECTOMY    . polypectomy    . TONSILLECTOMY   1966     OB History    Gravida  3   Para  2   Term      Preterm      AB      Living        SAB      TAB      Ectopic      Molar      Multiple      Live Births          Obstetric Comments  Age at first period 18 Age at first pregnancy 3 menopause age 46        Social History          Socioeconomic History  . Marital status: Married    Spouse name: Not on file  . Number of children: Not on file  . Years of education: Not on file  . Highest education level: Not on file  Occupational History  . Not on file  Tobacco Use  . Smoking status: Former Smoker    Quit date: 11/03/1997    Years since quitting: 22.0  . Smokeless tobacco: Never Used  Vaping Use  . Vaping Use: Never used  Substance and Sexual Activity  .  Alcohol use: No    Alcohol/week: 0.0 standard drinks  . Drug use: No  . Sexual activity: Defer  Other Topics Concern  . Not on file  Social History Narrative  . Not on file   Social Determinants of Health      Financial Resource Strain:   . Difficulty of Paying Living Expenses:   Food Insecurity:   . Worried About Charity fundraiser in the Last Year:   . Arboriculturist in the Last Year:   Transportation Needs:   . Film/video editor (Medical):   Marland Kitchen Lack of Transportation (Non-Medical):             Allergies  Allergen Reactions  . Moxifloxacin Hives and Shortness Of Breath  . Carbamazepine Other (See Comments)    Vomiting  . Fexofenadine Other (See Comments)    headaches  . Ibuprofen Rash  . Iodine Rash  . Sulfa (Sulfonamide Antibiotics) Other (See Comments)    Difficulty breathing, rash, tongue swelling  . Ioxaglate Meglumine Rash  . Ioxaglate Sodium Rash    Current Medications        Current Outpatient Medications  Medication Sig Dispense Refill  . albuterol (PROVENTIL) 2.5 mg /3 mL (0.083 %) nebulizer solution Take 3 mLs (2.5 mg total) by nebulization every 6 (six) hours as needed  for wheezing.      Marland Kitchen albuterol 90 mcg/actuation inhaler Inhale 2 inhalations into the lungs continuously as needed       . ascorbic acid (VITAMIN C ORAL) Take by mouth    . blood glucose meter kit USE TO CHECK BLOOD SUGARS DAILY. PLEASE DISPENSE WHAT IS COVERED THROUGH PATIENTS INSURANCE 1 each 0  . buPROPion (WELLBUTRIN XL) 150 MG XL tablet Take 300 mg by mouth every morning.      . fluticasone (FLONASE) 50 mcg/actuation nasal spray Place 2 sprays into both nostrils as needed.     . fluticasone propion-salmeteroL (ADVAIR HFA) 230-21 mcg/actuation inhaler Inhale 2 inhalations into the lungs 2 (two) times daily 3 Inhaler 3  . hyoscyamine (LEVSIN/SL) 0.125 mg SL tablet Place under the tongue as needed.     Marland Kitchen ipratropium (ATROVENT) 0.06 % nasal spray Place 2 sprays into both nostrils 3 (three) times daily 45 mL 3  . ipratropium-albuteroL (COMBIVENT RESPIMAT) 20-100 mcg/actuation inhaler Inhale 1 inhalation into the lungs 4 (four) times daily as needed for Wheezing 3 Inhaler 3  . JARDIANCE 25 mg tablet TAKE 1 TABLET DAILY 90 tablet 3  . LANTUS SOLOSTAR U-100 INSULIN pen injector (concentration 100 units/mL) Inject 20 Units subcutaneously nightly 45 mL 0  . metFORMIN (GLUCOPHAGE) 500 MG tablet Take 2 tablets (1,000 mg total) by mouth 2 (two) times daily 360 tablet 3  . metoprolol succinate (TOPROL XL) 50 MG XL tablet Take 25 mg by mouth 2 (two) times daily       . montelukast (SINGULAIR) 10 mg tablet Take 10 mg by mouth once daily       . pen needle, diabetic 31 gauge x 5/16" needle Use as directed 100 each 12  . RABEprazole (ACIPHEX) 20 mg EC tablet Take 20 mg by mouth once daily.    . TRULICITY 1.5 CB/7.6 mL subcutaneous injection INJECT 0.5 ML (1.5 MG) UNDER THE SKIN EVERY 7 DAYS 6 mL 3  . HYDROcodone-acetaminophen (NORCO) 5-325 mg tablet Take one tablet at night for pain; may take up to every 6 hours as needed for pain if not working or driving (Patient not  taking: Reported on  11/03/2019  ) 20 tablet 0   No current facility-administered medications for this visit.           Family History  Problem Relation Age of Onset  . Emphysema Mother   . Lung disease Mother   . Hyperlipidemia (Elevated cholesterol) Mother   . Ischemic heart disease Father   . Lung disease Sister   . Hyperlipidemia (Elevated cholesterol) Sister   . High blood pressure (Hypertension) Sister   . Crohn's disease Sister   . High blood pressure (Hypertension) Brother   . Emphysema Maternal Grandmother   . Breast cancer Neg Hx   . Colon cancer Neg Hx      Objective:   Physical Exam Exam conducted with a chaperone present.  Constitutional:      Appearance: Normal appearance.  Chest:     Breasts:        Right: No inverted nipple or nipple discharge.        Left: No inverted nipple or nipple discharge.       Comments: Left breast wide excision Lymphadenopathy:     Upper Body:     Right upper body: No axillary adenopathy.     Left upper body: No axillary adenopathy.  Skin:    General: Skin is warm and dry.  Neurological:     Mental Status: She is alert and oriented to person, place, and time.  Psychiatric:        Mood and Affect: Mood normal.        Behavior: Behavior normal.    Labs and Radiology:   Sep 04, 2019 pathology: DIAGNOSIS:  A. BREAST, LEFT DUCTAL STRUCTURES; EXCISION:  - LARGE DUCT WITH INTRADUCTAL PAPILLOMA WITH FLORID EPITHELIAL  HYPERPLASIA AND SCLEROSIS.  - DUCT ECTASIA.  - CAUTERIZED PAPILLOMA INVOLVES ANTERIOR MARGIN.  - NEGATIVE FOR ATYPIA AND MALIGNANCY.        Assessment:     Doing well post resection of retroareolar ductal structures.    Plan:     The patient reports that she has had on rare occasion a spot of blood from the nipple.  None recently.  Will observe at present.  The patient's last colonoscopy that I can identify was in 2009.  Adenomatous polyps in 2006, hyperplastic in 2009.  She should be having a  follow-up colonoscopy in the near future.  We will arrange for bilateral screening mammograms in April 2022 with an office visit to follow.     Entered by Ledell Noss, CMA, acting as a scribe for Dr. Hervey Ard, MD.   The documentation recorded by the scribe accurately reflects the service I personally performed and the decisions made by me.   Robert Bellow, MD FACS

## 2020-01-04 ENCOUNTER — Telehealth: Payer: Self-pay | Admitting: Internal Medicine

## 2020-01-04 NOTE — Telephone Encounter (Signed)
Pt needs a refill on the following sent to [facility]   metFORMIN (GLUCOPHAGE) 500 MG tablet metoprolol tartrate (LOPRESSOR) 25 MG tablet buPROPion (WELLBUTRIN XL) 300 MG 24 hr tablet RABEprazole (ACIPHEX) 20 MG tablet

## 2020-01-05 NOTE — Telephone Encounter (Signed)
I do not see where you have filled these for her before. Are you ok to fill?

## 2020-01-06 MED ORDER — METFORMIN HCL 500 MG PO TABS: 1000.0000 mg | ORAL_TABLET | Freq: Two times a day (BID) | ORAL | 1 refills | Status: DC

## 2020-01-06 MED ORDER — METOPROLOL TARTRATE 25 MG PO TABS: 25.0000 mg | ORAL_TABLET | Freq: Two times a day (BID) | ORAL | 1 refills | Status: DC

## 2020-01-06 MED ORDER — RABEPRAZOLE SODIUM 20 MG PO TBEC: 20.0000 mg | DELAYED_RELEASE_TABLET | Freq: Two times a day (BID) | ORAL | 1 refills | Status: DC

## 2020-01-06 MED ORDER — BUPROPION HCL ER (XL) 300 MG PO TB24: 300.0000 mg | ORAL_TABLET | Freq: Every day | ORAL | 1 refills | Status: DC

## 2020-01-06 NOTE — Telephone Encounter (Signed)
Pt aware.

## 2020-01-06 NOTE — Telephone Encounter (Signed)
rx's sent to [facility]

## 2020-01-06 NOTE — Addendum Note (Signed)
Addended by: Alisa Graff on: 01/06/2020 05:50 AM   Modules accepted: Orders

## 2020-02-08 ENCOUNTER — Other Ambulatory Visit: Payer: Self-pay

## 2020-02-08 ENCOUNTER — Other Ambulatory Visit
Admission: RE | Admit: 2020-02-08 | Discharge: 2020-02-08 | Disposition: A | Discharge status: Home / Self Care | Payer: Medicare Other | Source: Ambulatory Visit | Attending: General Surgery | Admitting: General Surgery

## 2020-02-08 DIAGNOSIS — Z01818 Encounter for other preprocedural examination: Secondary | ICD-10-CM | POA: Insufficient documentation

## 2020-02-08 DIAGNOSIS — Z20822 Contact with and (suspected) exposure to covid-19: Secondary | ICD-10-CM | POA: Diagnosis not present

## 2020-02-08 LAB — SARS CORONAVIRUS 2 (TAT 6-24 HRS): SARS Coronavirus 2: NEGATIVE

## 2020-02-09 ENCOUNTER — Encounter: Payer: Self-pay | Admitting: General Surgery

## 2020-02-10 ENCOUNTER — Ambulatory Visit: Payer: Medicare Other | Admitting: Anesthesiology

## 2020-02-10 ENCOUNTER — Encounter: Payer: Self-pay | Admitting: General Surgery

## 2020-02-10 ENCOUNTER — Encounter
Admission: RE | Disposition: A | Discharge status: Home / Self Care | Payer: Self-pay | Source: Home / Self Care | Attending: General Surgery

## 2020-02-10 ENCOUNTER — Ambulatory Visit
Admission: RE | Admit: 2020-02-10 | Discharge: 2020-02-10 | Disposition: A | Discharge status: Home / Self Care | Payer: Medicare Other | Attending: General Surgery | Admitting: General Surgery

## 2020-02-10 DIAGNOSIS — Z91041 Radiographic dye allergy status: Secondary | ICD-10-CM | POA: Insufficient documentation

## 2020-02-10 DIAGNOSIS — Z1211 Encounter for screening for malignant neoplasm of colon: Secondary | ICD-10-CM | POA: Diagnosis not present

## 2020-02-10 DIAGNOSIS — K5730 Diverticulosis of large intestine without perforation or abscess without bleeding: Secondary | ICD-10-CM | POA: Insufficient documentation

## 2020-02-10 DIAGNOSIS — Z886 Allergy status to analgesic agent status: Secondary | ICD-10-CM | POA: Diagnosis not present

## 2020-02-10 DIAGNOSIS — Z87891 Personal history of nicotine dependence: Secondary | ICD-10-CM | POA: Insufficient documentation

## 2020-02-10 DIAGNOSIS — Z882 Allergy status to sulfonamides status: Secondary | ICD-10-CM | POA: Insufficient documentation

## 2020-02-10 DIAGNOSIS — E119 Type 2 diabetes mellitus without complications: Secondary | ICD-10-CM | POA: Diagnosis not present

## 2020-02-10 DIAGNOSIS — Z881 Allergy status to other antibiotic agents status: Secondary | ICD-10-CM | POA: Diagnosis not present

## 2020-02-10 DIAGNOSIS — Z888 Allergy status to other drugs, medicaments and biological substances status: Secondary | ICD-10-CM | POA: Insufficient documentation

## 2020-02-10 HISTORY — PX: COLONOSCOPY WITH PROPOFOL: SHX5780

## 2020-02-10 HISTORY — DX: Vitamin D deficiency, unspecified: E55.9

## 2020-02-10 HISTORY — DX: Unspecified asthma, uncomplicated: J45.909

## 2020-02-10 LAB — GLUCOSE, CAPILLARY
Glucose-Capillary: 232 mg/dL — ABNORMAL HIGH (ref 70–99)
Glucose-Capillary: 184 mg/dL — ABNORMAL HIGH (ref 70–99)

## 2020-02-10 SURGERY — COLONOSCOPY WITH PROPOFOL
Anesthesia: General

## 2020-02-10 MED ORDER — SODIUM CHLORIDE 0.9 % IV SOLN
INTRAVENOUS | Status: DC
  Administered 2020-02-10: 1000 mL via INTRAVENOUS

## 2020-02-10 MED ORDER — INSULIN ASPART 100 UNIT/ML ~~LOC~~ SOLN
SUBCUTANEOUS | Status: AC
  Administered 2020-02-10: 5 [IU]
  Filled 2020-02-10: qty 1

## 2020-02-10 MED ORDER — PROPOFOL 500 MG/50ML IV EMUL
INTRAVENOUS | Status: AC
  Filled 2020-02-10: qty 50

## 2020-02-10 MED ORDER — LIDOCAINE HCL (CARDIAC) PF 100 MG/5ML IV SOSY
PREFILLED_SYRINGE | INTRAVENOUS | Status: DC | PRN
  Administered 2020-02-10: 50 mg via INTRAVENOUS

## 2020-02-10 MED ORDER — PROPOFOL 10 MG/ML IV BOLUS
INTRAVENOUS | Status: DC | PRN
  Administered 2020-02-10: 100 mg via INTRAVENOUS

## 2020-02-10 MED ORDER — PROPOFOL 500 MG/50ML IV EMUL
INTRAVENOUS | Status: DC | PRN
  Administered 2020-02-10: 100 ug/kg/min via INTRAVENOUS

## 2020-02-10 MED ORDER — MIDAZOLAM HCL 2 MG/2ML IJ SOLN
INTRAMUSCULAR | Status: AC
  Filled 2020-02-10: qty 2

## 2020-02-10 MED ORDER — MIDAZOLAM HCL 2 MG/2ML IJ SOLN
INTRAMUSCULAR | Status: DC | PRN
  Administered 2020-02-10 (×2): 1 mg via INTRAVENOUS

## 2020-02-10 NOTE — Transfer of Care (Signed)
Immediate Anesthesia Transfer of Care Note  Patient: Amanda Wiley  Procedure(s) Performed: COLONOSCOPY WITH PROPOFOL (N/A )  Patient Location: Endoscopy Unit  Anesthesia Type:General  Level of Consciousness: drowsy and patient cooperative  Airway & Oxygen Therapy: Patient Spontanous Breathing and Patient connected to nasal cannula oxygen  Post-op Assessment: Report given to RN and Post -op Vital signs reviewed and stable  Post vital signs: Reviewed and stable  Last Vitals:  Vitals Value Taken Time  BP 115/57 02/10/20 1025  Temp 36.4 C 02/10/20 1025  Pulse 94 02/10/20 1031  Resp 22 02/10/20 1031  SpO2 94 % 02/10/20 1031  Vitals shown include unvalidated device data.  Last Pain:  Vitals:   02/10/20 1025  TempSrc: Temporal  PainSc: 0-No pain         Complications: No complications documented.

## 2020-02-10 NOTE — H&P (Signed)
Amanda Wiley [id number] 11-16-1955     HPI:  64 y/o woman for a screening colonoscopy. Tolerated the prep well.   Medications Prior to Admission  Medication Sig Dispense Refill Last Dose  . montelukast (SINGULAIR) 10 MG tablet Take 10 mg by mouth at bedtime.     Marland Kitchen acetaminophen (TYLENOL) 325 MG tablet Take 2 tablets (650 mg total) by mouth every 6 (six) hours as needed for mild pain (or Fever >/= 101).     Marland Kitchen buPROPion (WELLBUTRIN XL) 300 MG 24 hr tablet Take 1 tablet (300 mg total) by mouth daily. 90 tablet 1   . Dulaglutide (TRULICITY) 1.5 RW/4.3XV SOPN Inject 1.5 mg into the skin once a week. ON SUNDAYS     . fluconazole (DIFLUCAN) 150 MG tablet Take one tablet by mouth for one dose. MAY REPEAT X1 IF PERSISTENT SYMPTOMS AFTER 3 DAYS 2 tablet 0   . fluticasone (FLONASE) 50 MCG/ACT nasal spray Place 1 spray into both nostrils as needed.      . fluticasone-salmeterol (ADVAIR HFA) 230-21 MCG/ACT inhaler Inhale 2 puffs into the lungs 2 (two) times daily.     . insulin glargine (LANTUS) 100 UNIT/ML injection Inject 20 Units into the skin at bedtime.      . Ipratropium-Albuterol (COMBIVENT) 20-100 MCG/ACT AERS respimat Inhale 2 puffs into the lungs every 4 (four) hours while awake. (Patient taking differently: Inhale 2 puffs into the lungs in the morning and at bedtime. ) 4 g 2   . JARDIANCE 25 MG TABS tablet Take 25 mg by mouth daily.     Marland Kitchen LORazepam (ATIVAN) 0.5 MG tablet Take 1/2 tablet prior to MRI.  May repeat x 1 prior to MRI if needed. (Patient not taking: Reported on 08/31/2019) 2 tablet 0   . metFORMIN (GLUCOPHAGE) 500 MG tablet Take 2 tablets (1,000 mg total) by mouth 2 (two) times daily. 360 tablet 1   . metoprolol tartrate (LOPRESSOR) 25 MG tablet Take 1 tablet (25 mg total) by mouth 2 (two) times daily. 180 tablet 1   . RABEprazole (ACIPHEX) 20 MG tablet Take 1 tablet (20 mg total) by mouth in the morning and at bedtime. 180 tablet 1   . ULTRACARE PEN NEEDLES 32G X 4 MM MISC      .  vitamin C (VITAMIN C) 1000 MG tablet Take 1 tablet (1,000 mg total) by mouth daily.      Allergies  Allergen Reactions  . Avelox [Moxifloxacin Hcl In Nacl] Hives, Shortness Of Breath and Other (See Comments)  . Moxifloxacin Hives and Shortness Of Breath  . Allegra [Fexofenadine Hcl] Other (See Comments)    headaches  . Ibuprofen Swelling    FACIAL  . Sulfa Antibiotics Other (See Comments)    Difficulty breathing, rash, tongue swelling  . Tegretol [Carbamazepine] Other (See Comments)    Other reaction(s): Other (See Comments) Vomiting Vomiting   . Iodine Rash  . Ioxaglate Rash  . Ivp Dye [Iodinated Diagnostic Agents] Rash   Past Medical History:  Diagnosis Date  . Arthritis   . Asthma   . Complication of anesthesia    HARD TO WAKE UP  . COPD (chronic obstructive pulmonary disease) ([place])   . Crohn's disease ([place])   . Depression   . Diabetes mellitus ([place])   . Fracture, foot    post fall  . Hypercholesterolemia   . Hypertension   . IBS (irritable bowel syndrome)   . Nephrolithiasis    s/p lithotripsy [facility])  .  Vitamin D deficiency    Past Surgical History:  Procedure Laterality Date  . ABDOMINAL HYSTERECTOMY  1985  . APPENDECTOMY  1985  . BREAST DUCTAL SYSTEM EXCISION Left 09/04/2019   Procedure: EXCISION DUCTAL SYSTEM BREAST;  Surgeon: Robert Bellow, MD;  Location: [facility] ORS;  Service: General;  Laterality: Left;  . BREAST EXCISIONAL BIOPSY Right 2013   benign  . COLONOSCOPY  2014  . EXCISION / BIOPSY BREAST / NIPPLE / DUCT Left 2019   papilloma  . EXCISION OF BREAST BIOPSY Left 2019   papilloma  . LAPAROSCOPIC CHOLECYSTECTOMY  2005   Dr Tamala Julian  . LAPAROSCOPIC SUPRACERVICAL HYSTERECTOMY  1985   left ovary not removed  . LITHOTRIPSY     x7  . OOPHORECTOMY    . POLYPECTOMY    . SHOULDER SURGERY     right (pinning)  . SHOULDER SURGERY     left (pinning)  . TONSILLECTOMY  1966   Social History   Socioeconomic History  . Marital  status: Married    Spouse name: Not on file  . Number of children: 2  . Years of education: Not on file  . Highest education level: Not on file  Occupational History    Employer: [employer]  Tobacco Use  . Smoking status: Former Smoker    Packs/day: 2.00    Years: 15.00    Pack years: 30.00    Quit date: 04/09/1997    Years since quitting: 22.8  . Smokeless tobacco: Never Used  Vaping Use  . Vaping Use: Never used  Substance and Sexual Activity  . Alcohol use: Never    Alcohol/week: 0.0 standard drinks  . Drug use: No  . Sexual activity: Not on file  Other Topics Concern  . Not on file  Social History Narrative  . Not on file   Social Determinants of Health   Financial Resource Strain:   . Difficulty of Paying Living Expenses: Not on file  Food Insecurity:   . Worried About Charity fundraiser in the Last Year: Not on file  . Ran Out of Food in the Last Year: Not on file  Transportation Needs:   . Lack of Transportation (Medical): Not on file  . Lack of Transportation (Non-Medical): Not on file  Physical Activity:   . Days of Exercise per Week: Not on file  . Minutes of Exercise per Session: Not on file  Stress:   . Feeling of Stress : Not on file  Social Connections:   . Frequency of Communication with Friends and Family: Not on file  . Frequency of Social Gatherings with Friends and Family: Not on file  . Attends Religious Services: Not on file  . Active Member of Clubs or Organizations: Not on file  . Attends Archivist Meetings: Not on file  . Marital Status: Not on file  Intimate Partner Violence:   . Fear of Current or Ex-Partner: Not on file  . Emotionally Abused: Not on file  . Physically Abused: Not on file  . Sexually Abused: Not on file   Social History   Social History Narrative  . Not on file     ROS: Negative.     PE: HEENT: Negative. Lungs: Clear. Cardio: RR. Mild tachycardia.   Assessment/Plan:  Proceed with planned  endoscopy.   Forest Gleason [facility] 02/10/2020

## 2020-02-10 NOTE — Anesthesia Postprocedure Evaluation (Signed)
Anesthesia Post Note  Patient: TEMPERENCE ZENOR  Procedure(s) Performed: COLONOSCOPY WITH PROPOFOL (N/A )  Patient location during evaluation: Endoscopy Anesthesia Type: General Level of consciousness: awake and alert Pain management: pain level controlled Vital Signs Assessment: post-procedure vital signs reviewed and stable Respiratory status: spontaneous breathing, nonlabored ventilation, respiratory function stable and patient connected to nasal cannula oxygen Cardiovascular status: blood pressure returned to baseline and stable Postop Assessment: no apparent nausea or vomiting Anesthetic complications: no   No complications documented.   Last Vitals:  Vitals:   02/10/20 1035 02/10/20 1045  BP: 134/72 (!) 141/70  Pulse: 91 89  Resp: (!) 21 (!) 23  Temp:    SpO2: 92% 96%    Last Pain:  Vitals:   02/10/20 1045  TempSrc:   PainSc: 0-No pain                 Precious Haws Lourine Alberico

## 2020-02-10 NOTE — Anesthesia Preprocedure Evaluation (Signed)
Anesthesia Evaluation  Patient identified by MRN, date of birth, ID band Patient awake    Reviewed: Allergy & Precautions, H&P , NPO status , Patient's Chart, lab work & pertinent test results, reviewed documented beta blocker date and time   History of Anesthesia Complications (+) PROLONGED EMERGENCE and history of anesthetic complications  Airway Mallampati: II  TM Distance: <3 FB Neck ROM: limited    Dental  (+) Dental Advidsory Given, Caps, Missing, Chipped   Pulmonary neg shortness of breath, asthma , neg sleep apnea, pneumonia (2/2 COVID-19), unresolved, COPD, neg recent URI, former smoker,    Pulmonary exam normal        Cardiovascular Exercise Tolerance: Good hypertension, (-) angina(-) Past MI and (-) Cardiac Stents Normal cardiovascular exam(-) dysrhythmias (-) Valvular Problems/Murmurs     Neuro/Psych PSYCHIATRIC DISORDERS Anxiety Depression negative neurological ROS     GI/Hepatic Neg liver ROS, GERD  ,  Endo/Other  diabetes  Renal/GU Renal disease (kidney stone)  negative genitourinary   Musculoskeletal   Abdominal   Peds  Hematology negative hematology ROS (+)   Anesthesia Other Findings Past Medical History: No date: Arthritis No date: Complication of anesthesia     Comment:  HARD TO WAKE UP No date: COPD (chronic obstructive pulmonary disease) (HCC) No date: Crohn's disease ([place]) No date: Depression No date: Diabetes mellitus ([place]) No date: Fracture, foot     Comment:  post fall No date: Hypercholesterolemia No date: Hypertension No date: IBS (irritable bowel syndrome) No date: Nephrolithiasis     Comment:  s/p lithotripsy [facility])   Reproductive/Obstetrics negative OB ROS                             Anesthesia Physical  Anesthesia Plan  ASA: III  Anesthesia Plan: General   Post-op Pain Management:    Induction: Intravenous  PONV Risk Score  and Plan: Propofol infusion and TIVA  Airway Management Planned: Natural Airway and Nasal Cannula  Additional Equipment:   Intra-op Plan:   Post-operative Plan:   Informed Consent: I have reviewed the patients History and Physical, chart, labs and discussed the procedure including the risks, benefits and alternatives for the proposed anesthesia with the patient or authorized representative who has indicated his/her understanding and acceptance.     Dental Advisory Given  Plan Discussed with: Anesthesiologist, CRNA and Surgeon  Anesthesia Plan Comments: (Patient consented for risks of anesthesia including but not limited to:  - adverse reactions to medications - risk of intubation if required - damage to eyes, teeth, lips or other oral mucosa - nerve damage due to positioning  - sore throat or hoarseness - Damage to heart, brain, nerves, lungs, other parts of body or loss of life  Patient voiced understanding.)        Anesthesia Quick Evaluation

## 2020-02-10 NOTE — Op Note (Addendum)
[facility] Patient Name: Amanda Wiley Procedure Date: 02/10/2020 9:33 AM MRN: [id number] Account #: [id number] Date of Birth: 10-30-1955 Admit Type: Outpatient Age: 64 Room: [facility] ENDO ROOM 1 Gender: Female Note Status: Supervisor Override Procedure:             Colonoscopy Indications:           Personal history of colonic polyps Providers:             Robert Bellow, MD Medicines:             Monitored Anesthesia Care Complications:         No immediate complications. Procedure:             Pre-Anesthesia Assessment:                        - Prior to the procedure, a History and Physical was                         performed, and patient medications, allergies and                         sensitivities were reviewed. The patient's tolerance                         of previous anesthesia was reviewed.                        - The risks and benefits of the procedure and the                         sedation options and risks were discussed with the                         patient. All questions were answered and informed                         consent was obtained.                        After obtaining informed consent, the colonoscope was                         passed under direct vision. Throughout the procedure,                         the patient's blood pressure, pulse, and oxygen                         saturations were monitored continuously. The                         Colonoscope was introduced through the anus and                         advanced to the the cecum, identified by appendiceal                         orifice and ileocecal valve. The colonoscopy was  performed without difficulty. The patient tolerated                         the procedure well. The quality of the bowel                         preparation was excellent. Findings:      A few small-mouthed diverticula were found in the sigmoid colon  and       descending colon.      The retroflexed view of the distal rectum and anal verge was normal and       showed no anal or rectal abnormalities. Impression:            - Diverticulosis in the sigmoid colon and in the                         descending colon.                        - The distal rectum and anal verge are normal on                         retroflexion view.                        - No specimens collected. Recommendation:        - Repeat colonoscopy in 10 years for screening                         purposes. Procedure Code(s):     --- Professional ---                        [phone], Colonoscopy, flexible; diagnostic, including                         collection of specimen(s) by brushing or washing, when                         performed (separate procedure) Diagnosis Code(s):     --- Professional ---                        K57.30, Diverticulosis of large intestine without                         perforation or abscess without bleeding                        Z12.11, Encounter for screening for malignant neoplasm                         of colon CPT copyright 2019 American Medical Association. All rights reserved. The codes documented in this report are preliminary and upon coder review may  be revised to meet current compliance requirements. Robert Bellow, MD 02/10/2020 10:23:31 AM This report has been signed electronically. Number of Addenda: 0 Note Initiated On: 02/10/2020 9:33 AM Scope Withdrawal Time: 0 hours 6 minutes 51 seconds  Total Procedure Duration: 0 hours 11 minutes 25 seconds       [facility]

## 2020-02-11 ENCOUNTER — Encounter: Payer: Self-pay | Admitting: General Surgery

## 2020-02-12 ENCOUNTER — Other Ambulatory Visit (INDEPENDENT_AMBULATORY_CARE_PROVIDER_SITE_OTHER): Payer: Medicare Other

## 2020-02-12 ENCOUNTER — Other Ambulatory Visit: Payer: Self-pay

## 2020-02-12 DIAGNOSIS — I10 Essential (primary) hypertension: Secondary | ICD-10-CM

## 2020-02-12 DIAGNOSIS — D45 Polycythemia vera: Secondary | ICD-10-CM

## 2020-02-12 DIAGNOSIS — E1165 Type 2 diabetes mellitus with hyperglycemia: Secondary | ICD-10-CM

## 2020-02-12 DIAGNOSIS — E7800 Pure hypercholesterolemia, unspecified: Secondary | ICD-10-CM

## 2020-02-12 DIAGNOSIS — Z23 Encounter for immunization: Secondary | ICD-10-CM | POA: Diagnosis not present

## 2020-02-12 LAB — CBC WITH DIFFERENTIAL/PLATELET
Basophils Absolute: 0.1 10*3/uL (ref 0.0–0.1)
Basophils Relative: 1.1 % (ref 0.0–3.0)
Eosinophils Absolute: 0.3 10*3/uL (ref 0.0–0.7)
Eosinophils Relative: 3.8 % (ref 0.0–5.0)
HCT: 47.7 % — ABNORMAL HIGH (ref 36.0–46.0)
Hemoglobin: 15.6 g/dL — ABNORMAL HIGH (ref 12.0–15.0)
Lymphocytes Relative: 27.4 % (ref 12.0–46.0)
Lymphs Abs: 2 10*3/uL (ref 0.7–4.0)
MCHC: 32.6 g/dL (ref 30.0–36.0)
MCV: 85.3 fl (ref 78.0–100.0)
Monocytes Absolute: 0.8 10*3/uL (ref 0.1–1.0)
Monocytes Relative: 10.6 % (ref 3.0–12.0)
Neutro Abs: 4.1 10*3/uL (ref 1.4–7.7)
Neutrophils Relative %: 57.1 % (ref 43.0–77.0)
Platelets: 169 10*3/uL (ref 150.0–400.0)
RBC: 5.59 Mil/uL — ABNORMAL HIGH (ref 3.87–5.11)
RDW: 16.9 % — ABNORMAL HIGH (ref 11.5–15.5)
WBC: 7.2 10*3/uL (ref 4.0–10.5)

## 2020-02-12 LAB — LIPID PANEL
Cholesterol: 222 mg/dL — ABNORMAL HIGH (ref 0–200)
HDL: 40.5 mg/dL (ref >39.00)
NonHDL: 181.72
Total CHOL/HDL Ratio: 5
Triglycerides: 330 mg/dL — ABNORMAL HIGH (ref 0.0–149.0)
VLDL: 66 mg/dL — ABNORMAL HIGH (ref 0.0–40.0)

## 2020-02-12 LAB — HEPATIC FUNCTION PANEL
ALT: 26 U/L (ref 0–35)
AST: 17 U/L (ref 0–37)
Albumin: 4 g/dL (ref 3.5–5.2)
Alkaline Phosphatase: 60 U/L (ref 39–117)
Bilirubin, Direct: 0.1 mg/dL (ref 0.0–0.3)
Total Bilirubin: 0.4 mg/dL (ref 0.2–1.2)
Total Protein: 6.6 g/dL (ref 6.0–8.3)

## 2020-02-12 LAB — BASIC METABOLIC PANEL
BUN: 12 mg/dL (ref 6–23)
CO2: 26 mEq/L (ref 19–32)
Calcium: 9 mg/dL (ref 8.4–10.5)
Chloride: 106 mEq/L (ref 96–112)
Creatinine, Ser: 0.75 mg/dL (ref 0.40–1.20)
GFR: 83.77 mL/min (ref >60.00)
Glucose, Bld: 169 mg/dL — ABNORMAL HIGH (ref 70–99)
Potassium: 4.3 mEq/L (ref 3.5–5.1)
Sodium: 142 mEq/L (ref 135–145)

## 2020-02-12 LAB — HEMOGLOBIN A1C: Hgb A1c MFr Bld: 7.7 % — ABNORMAL HIGH (ref 4.6–6.5)

## 2020-02-12 LAB — LDL CHOLESTEROL, DIRECT: Direct LDL: 149 mg/dL

## 2020-02-16 ENCOUNTER — Ambulatory Visit (INDEPENDENT_AMBULATORY_CARE_PROVIDER_SITE_OTHER): Payer: Medicare Other | Admitting: Internal Medicine

## 2020-02-16 ENCOUNTER — Other Ambulatory Visit: Payer: Self-pay

## 2020-02-16 ENCOUNTER — Encounter: Payer: Self-pay | Admitting: Internal Medicine

## 2020-02-16 VITALS — BP 128/80 | HR 93 | Temp 98.2°F | Resp 16 | Ht 66.0 in | Wt 220.0 lb

## 2020-02-16 DIAGNOSIS — E1169 Type 2 diabetes mellitus with other specified complication: Secondary | ICD-10-CM

## 2020-02-16 DIAGNOSIS — E7800 Pure hypercholesterolemia, unspecified: Secondary | ICD-10-CM | POA: Diagnosis not present

## 2020-02-16 DIAGNOSIS — E559 Vitamin D deficiency, unspecified: Secondary | ICD-10-CM

## 2020-02-16 DIAGNOSIS — R945 Abnormal results of liver function studies: Secondary | ICD-10-CM | POA: Diagnosis not present

## 2020-02-16 DIAGNOSIS — J4520 Mild intermittent asthma, uncomplicated: Secondary | ICD-10-CM

## 2020-02-16 DIAGNOSIS — E669 Obesity, unspecified: Secondary | ICD-10-CM

## 2020-02-16 DIAGNOSIS — R7989 Other specified abnormal findings of blood chemistry: Secondary | ICD-10-CM

## 2020-02-16 DIAGNOSIS — Z0000 Encounter for general adult medical examination without abnormal findings: Secondary | ICD-10-CM

## 2020-02-16 DIAGNOSIS — D45 Polycythemia vera: Secondary | ICD-10-CM

## 2020-02-16 DIAGNOSIS — I10 Essential (primary) hypertension: Secondary | ICD-10-CM

## 2020-02-16 DIAGNOSIS — E1159 Type 2 diabetes mellitus with other circulatory complications: Secondary | ICD-10-CM

## 2020-02-16 DIAGNOSIS — F320 Major depressive disorder, single episode, mild: Secondary | ICD-10-CM

## 2020-02-16 DIAGNOSIS — F32A Depression, unspecified: Secondary | ICD-10-CM

## 2020-02-16 DIAGNOSIS — K219 Gastro-esophageal reflux disease without esophagitis: Secondary | ICD-10-CM

## 2020-02-16 DIAGNOSIS — K5090 Crohn's disease, unspecified, without complications: Secondary | ICD-10-CM

## 2020-02-16 DIAGNOSIS — E1165 Type 2 diabetes mellitus with hyperglycemia: Secondary | ICD-10-CM

## 2020-02-16 DIAGNOSIS — I152 Hypertension secondary to endocrine disorders: Secondary | ICD-10-CM

## 2020-02-16 MED ORDER — LOVASTATIN 10 MG PO TABS: ORAL_TABLET | ORAL | 1 refills | Status: DC

## 2020-02-16 NOTE — Assessment & Plan Note (Addendum)
Physical today 02/16/20.  PAP 12/20/16 - negative with negative HPV.  Colonoscopy last week.  Breast MRI 08/2019 - recommended further evaluation.  Saw surgery.  S/p left breast duct excision - 09/04/19.  Recommended f/u mammogram 07/2020.

## 2020-02-16 NOTE — Progress Notes (Signed)
[name], female   DOB: 1955/08/14, 64 y.o.   MRN: [id number]   Subjective:    [name], female    DOB: 18-Jul-1955, 64 y.o.   MRN: [id number]  HPI This visit occurred during the SARS-CoV-2 public health emergency.  Safety protocols were in place, including screening questions prior to the visit, additional usage of staff PPE, and extensive cleaning of exam room while observing appropriate contact time as indicated for disinfecting solutions.  Patient here for her physical exam.  S/p left breast duct excision 09/04/19.  Recommended bilateral screening mammogram 07/2020.  Breathing doing well.  No chest pain or sob reported.  No abdominal pain or bowel change reported.  Seeing Dr Gabriel Carina for f/u of her diabetes.  Handling stress.  Overall feeling better.    Past Medical History:  Diagnosis Date  . Arthritis   . Asthma   . Complication of anesthesia    HARD TO WAKE UP  . COPD (chronic obstructive pulmonary disease) ([place])   . Crohn's disease ([place])   . Depression   . Diabetes mellitus ([place])   . Fracture, foot    post fall  . Hypercholesterolemia   . Hypertension   . IBS (irritable bowel syndrome)   . Nephrolithiasis    s/p lithotripsy [facility])  . Vitamin D deficiency    Past Surgical History:  Procedure Laterality Date  . ABDOMINAL HYSTERECTOMY  1985  . APPENDECTOMY  1985  . BREAST DUCTAL SYSTEM EXCISION Left 09/04/2019   Procedure: EXCISION DUCTAL SYSTEM BREAST;  Surgeon: Robert Bellow, MD;  Location: [facility] ORS;  Service: General;  Laterality: Left;  . BREAST EXCISIONAL BIOPSY Right 2013   benign  . COLONOSCOPY  2014  . COLONOSCOPY WITH PROPOFOL N/A 02/10/2020   Procedure: COLONOSCOPY WITH PROPOFOL;  Surgeon: Robert Bellow, MD;  Location: [facility] ENDOSCOPY;  Service: Endoscopy;  Laterality: N/A;  . EXCISION / BIOPSY BREAST / NIPPLE / DUCT Left 2019   papilloma  . EXCISION OF BREAST BIOPSY Left 2019   papilloma  . LAPAROSCOPIC  CHOLECYSTECTOMY  2005   Dr Tamala Julian  . LAPAROSCOPIC SUPRACERVICAL HYSTERECTOMY  1985   left ovary not removed  . LITHOTRIPSY     x7  . OOPHORECTOMY    . POLYPECTOMY    . SHOULDER SURGERY     right (pinning)  . SHOULDER SURGERY     left (pinning)  . TONSILLECTOMY  1966   Family History  Problem Relation Age of Onset  . Heart disease Father   . Emphysema Maternal Grandmother   . COPD Mother   . Hypercholesterolemia Mother   . Emphysema Mother   . Hypertension Brother   . Emphysema Other        grandmother  . COPD Sister   . Crohn's disease Sister   . Hypertension Sister   . Breast cancer Neg Hx   . Colon cancer Neg Hx    Social History   Socioeconomic History  . Marital status: Married    Spouse name: Not on file  . Number of children: 2  . Years of education: Not on file  . Highest education level: Not on file  Occupational History    Employer: [employer]  Tobacco Use  . Smoking status: Former Smoker    Packs/day: 2.00    Years: 15.00    Pack years: 30.00    Quit date: 04/09/1997    Years since quitting: 22.8  . Smokeless tobacco: Never  Used  Vaping Use  . Vaping Use: Never used  Substance and Sexual Activity  . Alcohol use: Never    Alcohol/week: 0.0 standard drinks  . Drug use: No  . Sexual activity: Not on file  Other Topics Concern  . Not on file  Social History Narrative  . Not on file   Social Determinants of Health   Financial Resource Strain:   . Difficulty of Paying Living Expenses: Not on file  Food Insecurity:   . Worried About Charity fundraiser in the Last Year: Not on file  . Ran Out of Food in the Last Year: Not on file  Transportation Needs:   . Lack of Transportation (Medical): Not on file  . Lack of Transportation (Non-Medical): Not on file  Physical Activity:   . Days of Exercise per Week: Not on file  . Minutes of Exercise per Session: Not on file  Stress:   . Feeling of Stress : Not on file  Social Connections:   .  Frequency of Communication with Friends and Family: Not on file  . Frequency of Social Gatherings with Friends and Family: Not on file  . Attends Religious Services: Not on file  . Active Member of Clubs or Organizations: Not on file  . Attends Archivist Meetings: Not on file  . Marital Status: Not on file    Outpatient Encounter Medications as of 02/16/2020  Medication Sig  . acetaminophen (TYLENOL) 325 MG tablet Take 2 tablets (650 mg total) by mouth every 6 (six) hours as needed for mild pain (or Fever >/= 101).  Marland Kitchen buPROPion (WELLBUTRIN XL) 300 MG 24 hr tablet Take 1 tablet (300 mg total) by mouth daily.  . Dulaglutide (TRULICITY) 1.5 JK/9.3OI SOPN Inject 1.5 mg into the skin once a week. ON SUNDAYS  . fluconazole (DIFLUCAN) 150 MG tablet Take one tablet by mouth for one dose. MAY REPEAT X1 IF PERSISTENT SYMPTOMS AFTER 3 DAYS  . fluticasone (FLONASE) 50 MCG/ACT nasal spray Place 1 spray into both nostrils as needed.   . fluticasone-salmeterol (ADVAIR HFA) 230-21 MCG/ACT inhaler Inhale 2 puffs into the lungs 2 (two) times daily.  . insulin glargine (LANTUS) 100 UNIT/ML injection Inject 20 Units into the skin at bedtime.   . Ipratropium-Albuterol (COMBIVENT) 20-100 MCG/ACT AERS respimat Inhale 2 puffs into the lungs every 4 (four) hours while awake. (Patient taking differently: Inhale 2 puffs into the lungs in the morning and at bedtime. )  . JARDIANCE 25 MG TABS tablet Take 25 mg by mouth daily.  Marland Kitchen LORazepam (ATIVAN) 0.5 MG tablet Take 1/2 tablet prior to MRI.  May repeat x 1 prior to MRI if needed. (Patient not taking: Reported on 08/31/2019)  . lovastatin (MEVACOR) 10 MG tablet Take one tablet q week.  . metFORMIN (GLUCOPHAGE) 500 MG tablet Take 2 tablets (1,000 mg total) by mouth 2 (two) times daily.  . metoprolol tartrate (LOPRESSOR) 25 MG tablet Take 1 tablet (25 mg total) by mouth 2 (two) times daily.  . montelukast (SINGULAIR) 10 MG tablet Take 10 mg by mouth at bedtime.  .  RABEprazole (ACIPHEX) 20 MG tablet Take 1 tablet (20 mg total) by mouth in the morning and at bedtime.  Marland Kitchen ULTRACARE PEN NEEDLES 32G X 4 MM MISC   . vitamin C (VITAMIN C) 1000 MG tablet Take 1 tablet (1,000 mg total) by mouth daily.   No facility-administered encounter medications on file as of 02/16/2020.    Review of Systems  Constitutional: Negative for appetite change and unexpected weight change.  HENT: Negative for congestion, sinus pressure and sore throat.   Eyes: Negative for pain and visual disturbance.  Respiratory: Negative for cough, chest tightness and shortness of breath.   Cardiovascular: Negative for chest pain, palpitations and leg swelling.  Gastrointestinal: Negative for abdominal pain, diarrhea, nausea and vomiting.  Genitourinary: Negative for difficulty urinating and dysuria.  Musculoskeletal: Negative for joint swelling and myalgias.  Skin: Negative for color change and rash.  Neurological: Negative for dizziness, light-headedness and headaches.  Hematological: Negative for adenopathy. Does not bruise/bleed easily.  Psychiatric/Behavioral: Negative for agitation and dysphoric mood.       Objective:    Physical Exam Vitals reviewed.  Constitutional:      General: She is not in acute distress.    Appearance: Normal appearance.  HENT:     Head: Normocephalic and atraumatic.     Right Ear: External ear normal.     Left Ear: External ear normal.  Eyes:     General: No scleral icterus.       Right eye: No discharge.        Left eye: No discharge.     Conjunctiva/sclera: Conjunctivae normal.  Neck:     Thyroid: No thyromegaly.  Cardiovascular:     Rate and Rhythm: Normal rate and regular rhythm.  Pulmonary:     Effort: No respiratory distress.     Breath sounds: Normal breath sounds. No wheezing.  Abdominal:     General: Bowel sounds are normal.     Palpations: Abdomen is soft.     Tenderness: There is no abdominal tenderness.  Musculoskeletal:         General: No swelling or tenderness.     Cervical back: Neck supple. No tenderness.  Lymphadenopathy:     Cervical: No cervical adenopathy.  Skin:    Findings: No erythema or rash.  Neurological:     Mental Status: She is alert.  Psychiatric:        Mood and Affect: Mood normal.        Behavior: Behavior normal.     BP 128/80   Pulse 93   Temp 98.2 F (36.8 C) (Oral)   Resp 16   Ht _0  (1.676 m)   Wt 220 lb (99.8 kg)   SpO2 96%   BMI 35.51 kg/m  Wt Readings from Last 3 Encounters:  02/16/20 220 lb (99.8 kg)  02/10/20 215 lb (97.5 kg)  10/16/19 220 lb (99.8 kg)     Lab Results  Component Value Date   WBC 7.2 02/12/2020   HGB 15.6 (H) 02/12/2020   HCT 47.7 (H) 02/12/2020   PLT 169.0 02/12/2020   GLUCOSE 169 (H) 02/12/2020   CHOL 222 (H) 02/12/2020   TRIG 330.0 (H) 02/12/2020   HDL 40.50 02/12/2020   LDLDIRECT 149.0 02/12/2020   LDLCALC 148 (H) 07/24/2013   ALT 26 02/12/2020   AST 17 02/12/2020   NA 142 02/12/2020   K 4.3 02/12/2020   CL 106 02/12/2020   CREATININE 0.75 02/12/2020   BUN 12 02/12/2020   CO2 26 02/12/2020   TSH 2.15 06/02/2019   INR 1.1 02/02/2019   HGBA1C 7.7 (H) 02/12/2020   MICROALBUR 0.7 06/02/2019       Assessment & Plan:   Problem List Items Addressed This Visit    Vitamin D deficiency    Follow vitamin D level.       Polycythemia vera ([place])  Has seen hematology.  Follow cbc.       Obesity, diabetes, and hypertension syndrome (H[facility])    Diet, exercise and weight loss.  Follow.        Relevant Medications   lovastatin (MEVACOR) 10 MG tablet   Mild depression ([facility])    Doing well.  On wellbutrin.  Stable.  Follow.       Hypercholesterolemia without hypertriglyceridemia - Primary    Off crestor. Has had intolerance to statin medication.  Trial of lovastatin.  Follow lipid panel and liver function tests.        Relevant Medications   lovastatin (MEVACOR) 10 MG tablet   Other Relevant Orders   Hepatic function panel    Health care maintenance    Physical today 02/16/20.  PAP 12/20/16 - negative with negative HPV.  Colonoscopy last week.  Breast MRI 08/2019 - recommended further evaluation.  Saw surgery.  S/p left breast duct excision - 09/04/19.  Recommended f/u mammogram 07/2020.        GERD (gastroesophageal reflux disease)    Upper symptoms controlled on aciphex.  Follow.        Essential (primary) hypertension    Continue on amlodipine and metoprolol.  Follow pressures.  Follow metabolic panel.       Relevant Medications   lovastatin (MEVACOR) 10 MG tablet   Diabetes mellitus ([place])    Followed by Dr Gabriel Carina.  a1c 7.4.  Discussed diet and exercise.  Follow met b and a1c.       Relevant Medications   lovastatin (MEVACOR) 10 MG tablet   Crohn's disease ([place])    Bowels stable.  Continue f/u with GI.       Asthma    Continue advair, combivent and singulair.  Breathing stable.  Follow.       Abnormal liver function tests    Diet and exercise.  Follow liver function tests.            Einar Pheasant, MD

## 2020-02-21 ENCOUNTER — Encounter: Payer: Self-pay | Admitting: Internal Medicine

## 2020-02-21 NOTE — Assessment & Plan Note (Signed)
Has seen hematology.  Follow cbc.

## 2020-02-21 NOTE — Assessment & Plan Note (Signed)
Continue advair, combivent and singulair.  Breathing stable.  Follow.

## 2020-02-21 NOTE — Assessment & Plan Note (Signed)
Bowels stable.  Continue f/u with GI.

## 2020-02-21 NOTE — Assessment & Plan Note (Signed)
Follow vitamin D level.

## 2020-02-21 NOTE — Assessment & Plan Note (Signed)
Continue on amlodipine and metoprolol.  Follow pressures.  Follow metabolic panel.

## 2020-02-21 NOTE — Assessment & Plan Note (Signed)
Followed by Dr Gabriel Carina.  a1c 7.4.  Discussed diet and exercise.  Follow met b and a1c.

## 2020-02-21 NOTE — Assessment & Plan Note (Signed)
Diet and exercise.  Follow liver function tests.

## 2020-02-21 NOTE — Assessment & Plan Note (Signed)
Upper symptoms controlled on aciphex.  Follow.

## 2020-02-21 NOTE — Assessment & Plan Note (Signed)
Diet, exercise and weight loss.  Follow.

## 2020-02-21 NOTE — Assessment & Plan Note (Signed)
Off crestor. Has had intolerance to statin medication.  Trial of lovastatin.  Follow lipid panel and liver function tests.

## 2020-02-21 NOTE — Assessment & Plan Note (Signed)
Doing well.  On wellbutrin.  Stable.  Follow.

## 2020-03-05 ENCOUNTER — Emergency Department: Payer: Medicare Other

## 2020-03-05 ENCOUNTER — Inpatient Hospital Stay
Admission: EM | Admit: 2020-03-05 | Discharge: 2020-03-08 | DRG: 247 | Disposition: A | Discharge status: Home / Self Care | Payer: Medicare Other | Attending: Family Medicine | Admitting: Family Medicine

## 2020-03-05 ENCOUNTER — Other Ambulatory Visit: Payer: Self-pay

## 2020-03-05 ENCOUNTER — Encounter: Payer: Self-pay | Admitting: *Deleted

## 2020-03-05 DIAGNOSIS — Z7984 Long term (current) use of oral hypoglycemic drugs: Secondary | ICD-10-CM

## 2020-03-05 DIAGNOSIS — I214 Non-ST elevation (NSTEMI) myocardial infarction: Secondary | ICD-10-CM | POA: Diagnosis not present

## 2020-03-05 DIAGNOSIS — Z87891 Personal history of nicotine dependence: Secondary | ICD-10-CM

## 2020-03-05 DIAGNOSIS — Z825 Family history of asthma and other chronic lower respiratory diseases: Secondary | ICD-10-CM

## 2020-03-05 DIAGNOSIS — Z7951 Long term (current) use of inhaled steroids: Secondary | ICD-10-CM

## 2020-03-05 DIAGNOSIS — K219 Gastro-esophageal reflux disease without esophagitis: Secondary | ICD-10-CM | POA: Diagnosis present

## 2020-03-05 DIAGNOSIS — E785 Hyperlipidemia, unspecified: Secondary | ICD-10-CM | POA: Diagnosis present

## 2020-03-05 DIAGNOSIS — Z83438 Family history of other disorder of lipoprotein metabolism and other lipidemia: Secondary | ICD-10-CM

## 2020-03-05 DIAGNOSIS — D45 Polycythemia vera: Secondary | ICD-10-CM | POA: Diagnosis present

## 2020-03-05 DIAGNOSIS — F319 Bipolar disorder, unspecified: Secondary | ICD-10-CM | POA: Diagnosis present

## 2020-03-05 DIAGNOSIS — I200 Unstable angina: Secondary | ICD-10-CM

## 2020-03-05 DIAGNOSIS — Z794 Long term (current) use of insulin: Secondary | ICD-10-CM

## 2020-03-05 DIAGNOSIS — E1165 Type 2 diabetes mellitus with hyperglycemia: Secondary | ICD-10-CM | POA: Diagnosis present

## 2020-03-05 DIAGNOSIS — E872 Acidosis: Secondary | ICD-10-CM | POA: Diagnosis present

## 2020-03-05 DIAGNOSIS — E876 Hypokalemia: Secondary | ICD-10-CM | POA: Diagnosis present

## 2020-03-05 DIAGNOSIS — Z882 Allergy status to sulfonamides status: Secondary | ICD-10-CM

## 2020-03-05 DIAGNOSIS — Z90710 Acquired absence of both cervix and uterus: Secondary | ICD-10-CM

## 2020-03-05 DIAGNOSIS — Z91041 Radiographic dye allergy status: Secondary | ICD-10-CM

## 2020-03-05 DIAGNOSIS — J449 Chronic obstructive pulmonary disease, unspecified: Secondary | ICD-10-CM | POA: Diagnosis present

## 2020-03-05 DIAGNOSIS — I25110 Atherosclerotic heart disease of native coronary artery with unstable angina pectoris: Secondary | ICD-10-CM | POA: Diagnosis present

## 2020-03-05 DIAGNOSIS — K5730 Diverticulosis of large intestine without perforation or abscess without bleeding: Secondary | ICD-10-CM | POA: Diagnosis present

## 2020-03-05 DIAGNOSIS — Z888 Allergy status to other drugs, medicaments and biological substances status: Secondary | ICD-10-CM

## 2020-03-05 DIAGNOSIS — Z87442 Personal history of urinary calculi: Secondary | ICD-10-CM

## 2020-03-05 DIAGNOSIS — Z20822 Contact with and (suspected) exposure to covid-19: Secondary | ICD-10-CM | POA: Diagnosis present

## 2020-03-05 DIAGNOSIS — Z79899 Other long term (current) drug therapy: Secondary | ICD-10-CM

## 2020-03-05 DIAGNOSIS — F419 Anxiety disorder, unspecified: Secondary | ICD-10-CM | POA: Diagnosis present

## 2020-03-05 DIAGNOSIS — D649 Anemia, unspecified: Secondary | ICD-10-CM | POA: Diagnosis present

## 2020-03-05 DIAGNOSIS — E7800 Pure hypercholesterolemia, unspecified: Secondary | ICD-10-CM | POA: Diagnosis present

## 2020-03-05 DIAGNOSIS — Z8249 Family history of ischemic heart disease and other diseases of the circulatory system: Secondary | ICD-10-CM

## 2020-03-05 DIAGNOSIS — I10 Essential (primary) hypertension: Secondary | ICD-10-CM | POA: Diagnosis present

## 2020-03-05 DIAGNOSIS — Z90721 Acquired absence of ovaries, unilateral: Secondary | ICD-10-CM

## 2020-03-05 LAB — CBC
HCT: 47.6 % — ABNORMAL HIGH (ref 36.0–46.0)
Hemoglobin: 15.6 g/dL — ABNORMAL HIGH (ref 12.0–15.0)
MCH: 28.1 pg (ref 26.0–34.0)
MCHC: 32.8 g/dL (ref 30.0–36.0)
MCV: 85.6 fL (ref 80.0–100.0)
Platelets: 216 10*3/uL (ref 150–400)
RBC: 5.56 MIL/uL — ABNORMAL HIGH (ref 3.87–5.11)
RDW: 15.8 % — ABNORMAL HIGH (ref 11.5–15.5)
WBC: 9.8 10*3/uL (ref 4.0–10.5)
nRBC: 0 % (ref 0.0–0.2)

## 2020-03-05 LAB — BASIC METABOLIC PANEL
Anion gap: 14 (ref 5–15)
BUN: 13 mg/dL (ref 8–23)
CO2: 19 mmol/L — ABNORMAL LOW (ref 22–32)
Calcium: 9.5 mg/dL (ref 8.9–10.3)
Chloride: 109 mmol/L (ref 98–111)
Creatinine, Ser: 0.96 mg/dL (ref 0.44–1.00)
GFR, Estimated: >60 mL/min (ref >60)
Glucose, Bld: 211 mg/dL — ABNORMAL HIGH (ref 70–99)
Potassium: 3.9 mmol/L (ref 3.5–5.1)
Sodium: 142 mmol/L (ref 135–145)

## 2020-03-05 LAB — TROPONIN I (HIGH SENSITIVITY): Troponin I (High Sensitivity): 705 ng/L (ref <18)

## 2020-03-05 NOTE — ED Provider Notes (Signed)
[facility] Emergency Department Provider Note   ____________________________________________   First MD Initiated Contact with Patient 03/05/20 2356     (approximate)  I have reviewed the triage vital signs and the nursing notes.   HISTORY  Chief Complaint Chest Pain    HPI KAYLYNN CHAMBLIN is a 64 y.o. female who presents to the ED from home with a chief complaint of chest pain.  Patient reports aching in her arms and chest which woke her up last evening.  Waxing/waning pain to the center of her chest today associated with clamminess.  Denies associated shortness of breath, nausea or vomiting.  Denies fever, abdominal pain, diarrhea or dizziness.  Chronic cough due to COPD.  Patient is vaccinated against COVID-19.  Took 1 baby aspirin prior to arrival.     Past Medical History:  Diagnosis Date  . Arthritis   . Asthma   . Complication of anesthesia    HARD TO WAKE UP  . COPD (chronic obstructive pulmonary disease) ([place])   . Crohn's disease ([place])   . Depression   . Diabetes mellitus ([place])   . Fracture, foot    post fall  . Hypercholesterolemia   . Hypertension   . IBS (irritable bowel syndrome)   . Nephrolithiasis    s/p lithotripsy [facility])  . Vitamin D deficiency     Patient Active Problem List   Diagnosis Date Noted  . NSTEMI (non-ST elevated myocardial infarction) ([place]) 03/06/2020  . Diabetes mellitus type 2, uncontrolled, with complications ([place]) 12/87/8676  . Hepatic steatosis 02/04/2019  . Acute respiratory disease due to COVID-19 virus 02/03/2019  . Pneumonia due to 2019-nCoV 01/12/2019  . Abnormal liver function tests 12/24/2018  . Hepatomegaly 12/24/2018  . Cyst of left kidney 12/22/2018  . Sinusitis 06/21/2018  . Polycythemia vera ([place]) 04/04/2018  . 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 Nipple discharge 02/16/2018  . Health care maintenance 12/11/2014  . Mild depression ([place]) 12/09/2014  . Allergic drug  reaction 12/09/2014  . Personal history of urinary calculi 12/09/2014  . Obesity, diabetes, and hypertension syndrome ([place]) 04/18/2014  . Asthma 03/04/2012  . Vitamin D deficiency 03/04/2012  . Crohn's disease ([place]) 03/04/2012  . GERD (gastroesophageal reflux disease) 03/04/2012  . Diabetes mellitus ([place]) 03/04/2012  . Hypertension 03/04/2012  . Acid reflux 03/04/2012  . Essential (primary) hypertension 03/04/2012  . Hypercholesterolemia without hypertriglyceridemia 03/04/2012  . CD (Crohn's disease) ([place]) 03/04/2012  . Adaptive colitis 01/30/2007    Past Surgical History:  Procedure Laterality Date  . ABDOMINAL HYSTERECTOMY  1985  . APPENDECTOMY  1985  . BREAST DUCTAL SYSTEM EXCISION Left 09/04/2019   Procedure: EXCISION DUCTAL SYSTEM BREAST;  Surgeon: Robert Bellow, MD;  Location: [facility] ORS;  Service: General;  Laterality: Left;  . BREAST EXCISIONAL BIOPSY Right 2013   benign  . COLONOSCOPY  2014  . COLONOSCOPY WITH PROPOFOL N/A 02/10/2020   Procedure: COLONOSCOPY WITH PROPOFOL;  Surgeon: Robert Bellow, MD;  Location: [facility] ENDOSCOPY;  Service: Endoscopy;  Laterality: N/A;  . EXCISION / BIOPSY BREAST / NIPPLE / DUCT Left 2019   papilloma  . EXCISION OF BREAST BIOPSY Left 2019   papilloma  . LAPAROSCOPIC CHOLECYSTECTOMY  2005   Dr Tamala Julian  . LAPAROSCOPIC SUPRACERVICAL HYSTERECTOMY  1985   left ovary not removed  . LITHOTRIPSY     x7  . OOPHORECTOMY    . POLYPECTOMY    . SHOULDER SURGERY     right (pinning)  .  SHOULDER SURGERY     left (pinning)  . TONSILLECTOMY  1966    Prior to Admission medications   Medication Sig Start Date End Date Taking? Authorizing Provider  acetaminophen (TYLENOL) 325 MG tablet Take 2 tablets (650 mg total) by mouth every 6 (six) hours as needed for mild pain (or Fever >/= 101). 02/03/19  Yes Flora Lipps, MD  albuterol (PROVENTIL) (2.5 MG/3ML) 0.083% nebulizer solution Take 2.5 mg by nebulization every 6 (six) hours as needed for  wheezing.   Yes [provider]  aspirin EC 81 MG tablet Take 81 mg by mouth once. Swallow whole.   Yes [provider]  buPROPion (WELLBUTRIN XL) 300 MG 24 hr tablet Take 1 tablet (300 mg total) by mouth daily. 01/06/20  Yes Einar Pheasant, MD  Dulaglutide (TRULICITY) 1.5 WE/9.9BZ SOPN Inject 1.5 mg into the skin once a week. ON SUNDAYS   Yes [provider]  fluticasone-salmeterol (ADVAIR HFA) 230-21 MCG/ACT inhaler Inhale 2 puffs into the lungs 2 (two) times daily.   Yes [provider]  hyoscyamine (LEVSIN SL) 0.125 MG SL tablet Place 0.125 mg under the tongue as needed.   Yes [provider]  insulin glargine (LANTUS) 100 UNIT/ML injection Inject 20 Units into the skin at bedtime.    Yes [provider]  ipratropium (ATROVENT) 0.06 % nasal spray Place 2 sprays into both nostrils 3 (three) times daily as needed for rhinitis.    Yes [provider]  Ipratropium-Albuterol (COMBIVENT) 20-100 MCG/ACT AERS respimat Inhale 2 puffs into the lungs every 4 (four) hours while awake. Patient taking differently: Inhale 1 puff into the lungs 2 (two) times daily. And as needed. 02/16/19  Yes Einar Pheasant, MD  JARDIANCE 25 MG TABS tablet Take 25 mg by mouth daily. 04/24/19  Yes [provider]  LORazepam (ATIVAN) 0.5 MG tablet Take 1/2 tablet prior to MRI.  May repeat x 1 prior to MRI if needed. 08/10/19  Yes Einar Pheasant, MD  lovastatin (MEVACOR) 10 MG tablet Take one tablet q week. 02/16/20  Yes Einar Pheasant, MD  metFORMIN (GLUCOPHAGE) 500 MG tablet Take 2 tablets (1,000 mg total) by mouth 2 (two) times daily. 01/06/20  Yes Einar Pheasant, MD  metoprolol tartrate (LOPRESSOR) 25 MG tablet Take 1 tablet (25 mg total) by mouth 2 (two) times daily. 01/06/20  Yes Einar Pheasant, MD  RABEprazole (ACIPHEX) 20 MG tablet Take 1 tablet (20 mg total) by mouth in the morning and at bedtime. 01/06/20  Yes Einar Pheasant, MD  vitamin C (VITAMIN C)  1000 MG tablet Take 1 tablet (1,000 mg total) by mouth daily. 02/03/19  Yes Flora Lipps, MD  ULTRACARE PEN NEEDLES 32G X 4 MM MISC  02/10/19   [provider]    Allergies Avelox [moxifloxacin hcl in nacl], Moxifloxacin, Allegra [fexofenadine hcl], Ibuprofen, Sulfa antibiotics, Tegretol [carbamazepine], Iodine, Ioxaglate, and Ivp dye [iodinated diagnostic agents]  Family History  Problem Relation Age of Onset  . Heart disease Father   . Emphysema Maternal Grandmother   . COPD Mother   . Hypercholesterolemia Mother   . Emphysema Mother   . Hypertension Brother   . Emphysema Other        grandmother  . COPD Sister   . Crohn's disease Sister   . Hypertension Sister   . Breast cancer Neg Hx   . Colon cancer Neg Hx     Social History Social History   Tobacco Use  . Smoking status: Former Smoker  Packs/day: 2.00    Years: 15.00    Pack years: 30.00    Quit date: 04/09/1997    Years since quitting: 22.9  . Smokeless tobacco: Never Used  Vaping Use  . Vaping Use: Never used  Substance Use Topics  . Alcohol use: Never    Alcohol/week: 0.0 standard drinks  . Drug use: No    Review of Systems  Constitutional: No fever/chills Eyes: No visual changes. ENT: No sore throat. Cardiovascular: Positive for chest pain. Respiratory: Denies shortness of breath. Gastrointestinal: No abdominal pain.  No nausea, no vomiting.  No diarrhea.  No constipation. Genitourinary: Negative for dysuria. Musculoskeletal: Negative for back pain. Skin: Negative for rash. Neurological: Negative for headaches, focal weakness or numbness.   ____________________________________________   PHYSICAL EXAM:  VITAL SIGNS: ED Triage Vitals [03/05/20 2236]  Enc Vitals Group     BP (!) 168/82     Pulse Rate 93     Resp 16     Temp 98.6 F (37 C)     Temp Source Oral     SpO2 95 %     Weight 220 lb 0.3 oz (99.8 kg)     Height 5' 6"  (1.676 m)     Head Circumference      Peak Flow       Pain Score 4     Pain Loc      Pain Edu?      Excl. in [place]?     Constitutional: Alert and oriented. Well appearing and in mild acute distress. Eyes: Conjunctivae are normal. PERRL. EOMI. Head: Atraumatic. Nose: No congestion/rhinnorhea. Mouth/Throat: Mucous membranes are moist.   Neck: No stridor.   Cardiovascular: Normal rate, regular rhythm. Grossly normal heart sounds.  Good peripheral circulation. Respiratory: Normal respiratory effort.  No retractions. Lungs CTAB. Gastrointestinal: Soft and nontender to light or deep palpation. No distention. No abdominal bruits. No CVA tenderness. Musculoskeletal: No lower extremity tenderness nor edema.  No joint effusions. Neurologic:  Normal speech and language. No gross focal neurologic deficits are appreciated. No gait instability. Skin:  Skin is warm, dry and intact. No rash noted. Psychiatric: Mood and affect are normal. Speech and behavior are normal.  ____________________________________________   LABS (all labs ordered are listed, but only abnormal results are displayed)  Labs Reviewed  BASIC METABOLIC PANEL - Abnormal; Notable for the following components:      Result Value   CO2 19 (*)    Glucose, Bld 211 (*)    All other components within normal limits  CBC - Abnormal; Notable for the following components:   RBC 5.56 (*)    Hemoglobin 15.6 (*)    HCT 47.6 (*)    RDW 15.8 (*)    All other components within normal limits  LACTIC ACID, PLASMA - Abnormal; Notable for the following components:   Lactic Acid, Venous 3.0 (*)    All other components within normal limits  BASIC METABOLIC PANEL - Abnormal; Notable for the following components:   Glucose, Bld 146 (*)    All other components within normal limits  CBC - Abnormal; Notable for the following components:   RBC 5.14 (*)    RDW 15.8 (*)    All other components within normal limits  TROPONIN I (HIGH SENSITIVITY) - Abnormal; Notable for the following components:    Troponin I (High Sensitivity) 705 (*)    All other components within normal limits  TROPONIN I (HIGH SENSITIVITY) - Abnormal; Notable for the following components:  Troponin I (High Sensitivity) 901 (*)    All other components within normal limits  RESP PANEL BY RT-PCR (FLU A&B, COVID) ARPGX2  CULTURE, BLOOD (ROUTINE X 2)  CULTURE, BLOOD (ROUTINE X 2)  PROTIME-INR  PROTIME-INR  APTT  LACTIC ACID, PLASMA  HEPARIN LEVEL (UNFRACTIONATED)  HIV ANTIBODY (ROUTINE TESTING W REFLEX)  LACTIC ACID, PLASMA   ____________________________________________  EKG  ED ECG REPORT I, [facility]el[facility] Violet J, the attending physician, personally viewed and interpreted this ECG.   Date: 03/06/2020  EKG Time: 2223  Rate: 95  Rhythm: normal EKG, normal sinus rhythm  Axis: Normal  Intervals:none  ST&T Change: Nonspecific  ED ECG REPORT I, Dorna Mallet J, the attending physician, personally viewed and interpreted this ECG.   Date: 03/06/2020  EKG Time: 2359  Rate: 94  Rhythm: normal EKG, normal sinus rhythm  Axis: Normal  Intervals:none  ST&T Change: Nonspecific   ____________________________________________  RADIOLOGY I, Keldan Eplin J, personally viewed and evaluated these images (plain radiographs) as part of my medical decision making, as well as reviewing the written report by the radiologist.  ED MD interpretation: Retrocardiac airspace opacity  Official radiology report(s): DG Chest 2 View  Result Date: 03/05/2020 CLINICAL DATA:  Chest pain.  Right arm into jaw. EXAM: CHEST - 2 VIEW COMPARISON:  Chest x-ray 02/07/2019 FINDINGS: The heart size and mediastinal contours unchanged. Biapical pleural/pulmonary scarring. Interval development of a retrocardiac airspace opacity. Similar appearing coarsened interstitial markings. No pulmonary edema. No pleural effusion. No pneumothorax. No acute osseous abnormality.  Right humeral surgical hardware. IMPRESSION: Interval development of a retrocardiac airspace  opacity that could represent infection or inflammation. Followup PA and lateral chest X-ray is recommended in 3-4 weeks following trial therapy to ensure resolution and exclude underlying malignancy. Electronically Signed   By: Iven Finn M.D.   On: 03/05/2020 22:59    ____________________________________________   PROCEDURES  Procedure(s) performed (including Critical Care):  .1-3 Lead EKG Interpretation Performed by: Paulette Blanch, MD Authorized by: Paulette Blanch, MD     Interpretation: normal     ECG rate:  94   ECG rate assessment: normal     Rhythm: sinus rhythm     Ectopy: none     Conduction: normal   Comments:     Patient placed on cardiac monitor to evaluate for arrhythmias   CRITICAL CARE Performed by: Paulette Blanch   Total critical care time: 30 minutes  Critical care time was exclusive of separately billable procedures and treating other patients.  Critical care was necessary to treat or prevent imminent or life-threatening deterioration.  Critical care was time spent personally by me on the following activities: development of treatment plan with patient and/or surrogate as well as nursing, discussions with consultants, evaluation of patient's response to treatment, examination of patient, obtaining history from patient or surrogate, ordering and performing treatments and interventions, ordering and review of laboratory studies, ordering and review of radiographic studies, pulse oximetry and re-evaluation of patient's condition.   ____________________________________________   INITIAL IMPRESSION / ASSESSMENT AND PLAN / ED COURSE  As part of my medical decision making, I reviewed the following data within the [place] notes reviewed and incorporated, Labs reviewed, EKG interpreted, Old chart reviewed (office visits for chronic bronchitis and diabetes), Radiograph reviewed, Discussed with admitting physician and Notes from prior ED  visits (respiratory visits)     64 year old female presenting with chest pain. Differential diagnosis includes, but is not limited to, ACS, aortic dissection,  pulmonary embolism, cardiac tamponade, pneumothorax, pneumonia, pericarditis, myocarditis, GI-related causes including esophagitis/gastritis, and musculoskeletal chest wall pain.    Initial troponin 705.  Possible retrocardiac opacity seen on chest x-ray; will obtain blood cultures and lactic acid.  Administer aspirin, nitroglycerin paste, heparin bolus and drip.  Have discussed case with hospitalist services for admission.      ____________________________________________   FINAL CLINICAL IMPRESSION(S) / ED DIAGNOSES  Final diagnoses:  NSTEMI (non-ST elevated myocardial infarction) ([place])  Unstable angina pectoris [facility])     ED Discharge Orders    None      *Please note:  SERAPHINA MITCHNER was evaluated in Emergency Department on 03/06/2020 for the symptoms described in the history of present illness. She was evaluated in the context of the global COVID-19 pandemic, which necessitated consideration that the patient might be at risk for infection with the SARS-CoV-2 virus that causes COVID-19. Institutional protocols and algorithms that pertain to the evaluation of patients at risk for COVID-19 are in a state of rapid change based on information released by regulatory bodies including the CDC and federal and state organizations. These policies and algorithms were followed during the patient's care in the ED.  Some ED evaluations and interventions may be delayed as a result of limited staffing during and the pandemic.*   Note:  This document was prepared using Dragon voice recognition software and may include unintentional dictation errors.   Paulette Blanch, MD 03/06/20 [phone]

## 2020-03-05 NOTE — ED Triage Notes (Signed)
Pt to ED reporting centralized chest pain radiating to her right arm and into her jaw. Pt reports the pain worsens intermittently but is not related to activity specifically. No hx of MI. No dizziness or lightheadedness.

## 2020-03-06 ENCOUNTER — Encounter: Payer: Self-pay | Admitting: Family Medicine

## 2020-03-06 ENCOUNTER — Other Ambulatory Visit: Payer: Self-pay

## 2020-03-06 ENCOUNTER — Inpatient Hospital Stay: Payer: Medicare Other

## 2020-03-06 ENCOUNTER — Inpatient Hospital Stay
Admit: 2020-03-06 | Discharge: 2020-03-06 | Disposition: A | Discharge status: Home / Self Care | Payer: Medicare Other | Attending: Family Medicine | Admitting: Family Medicine

## 2020-03-06 DIAGNOSIS — Z90721 Acquired absence of ovaries, unilateral: Secondary | ICD-10-CM | POA: Diagnosis not present

## 2020-03-06 DIAGNOSIS — Z8249 Family history of ischemic heart disease and other diseases of the circulatory system: Secondary | ICD-10-CM | POA: Diagnosis not present

## 2020-03-06 DIAGNOSIS — D649 Anemia, unspecified: Secondary | ICD-10-CM | POA: Diagnosis present

## 2020-03-06 DIAGNOSIS — Z91041 Radiographic dye allergy status: Secondary | ICD-10-CM | POA: Diagnosis not present

## 2020-03-06 DIAGNOSIS — E876 Hypokalemia: Secondary | ICD-10-CM | POA: Diagnosis present

## 2020-03-06 DIAGNOSIS — E785 Hyperlipidemia, unspecified: Secondary | ICD-10-CM | POA: Diagnosis not present

## 2020-03-06 DIAGNOSIS — Z90710 Acquired absence of both cervix and uterus: Secondary | ICD-10-CM | POA: Diagnosis not present

## 2020-03-06 DIAGNOSIS — E119 Type 2 diabetes mellitus without complications: Secondary | ICD-10-CM

## 2020-03-06 DIAGNOSIS — I200 Unstable angina: Secondary | ICD-10-CM | POA: Diagnosis present

## 2020-03-06 DIAGNOSIS — E872 Acidosis: Secondary | ICD-10-CM | POA: Diagnosis present

## 2020-03-06 DIAGNOSIS — E1165 Type 2 diabetes mellitus with hyperglycemia: Secondary | ICD-10-CM | POA: Diagnosis present

## 2020-03-06 DIAGNOSIS — Z794 Long term (current) use of insulin: Secondary | ICD-10-CM

## 2020-03-06 DIAGNOSIS — Z825 Family history of asthma and other chronic lower respiratory diseases: Secondary | ICD-10-CM | POA: Diagnosis not present

## 2020-03-06 DIAGNOSIS — J449 Chronic obstructive pulmonary disease, unspecified: Secondary | ICD-10-CM | POA: Diagnosis present

## 2020-03-06 DIAGNOSIS — I10 Essential (primary) hypertension: Secondary | ICD-10-CM | POA: Diagnosis present

## 2020-03-06 DIAGNOSIS — Z87442 Personal history of urinary calculi: Secondary | ICD-10-CM | POA: Diagnosis not present

## 2020-03-06 DIAGNOSIS — R9389 Abnormal findings on diagnostic imaging of other specified body structures: Secondary | ICD-10-CM

## 2020-03-06 DIAGNOSIS — F319 Bipolar disorder, unspecified: Secondary | ICD-10-CM | POA: Diagnosis present

## 2020-03-06 DIAGNOSIS — D45 Polycythemia vera: Secondary | ICD-10-CM | POA: Diagnosis present

## 2020-03-06 DIAGNOSIS — I214 Non-ST elevation (NSTEMI) myocardial infarction: Principal | ICD-10-CM | POA: Diagnosis present

## 2020-03-06 DIAGNOSIS — K219 Gastro-esophageal reflux disease without esophagitis: Secondary | ICD-10-CM | POA: Diagnosis present

## 2020-03-06 DIAGNOSIS — Z79899 Other long term (current) drug therapy: Secondary | ICD-10-CM | POA: Diagnosis not present

## 2020-03-06 DIAGNOSIS — F419 Anxiety disorder, unspecified: Secondary | ICD-10-CM | POA: Diagnosis present

## 2020-03-06 DIAGNOSIS — Z20822 Contact with and (suspected) exposure to covid-19: Secondary | ICD-10-CM | POA: Diagnosis present

## 2020-03-06 DIAGNOSIS — Z882 Allergy status to sulfonamides status: Secondary | ICD-10-CM | POA: Diagnosis not present

## 2020-03-06 DIAGNOSIS — Z888 Allergy status to other drugs, medicaments and biological substances status: Secondary | ICD-10-CM | POA: Diagnosis not present

## 2020-03-06 DIAGNOSIS — Z83438 Family history of other disorder of lipoprotein metabolism and other lipidemia: Secondary | ICD-10-CM | POA: Diagnosis not present

## 2020-03-06 DIAGNOSIS — Z955 Presence of coronary angioplasty implant and graft: Secondary | ICD-10-CM | POA: Diagnosis not present

## 2020-03-06 DIAGNOSIS — Z87891 Personal history of nicotine dependence: Secondary | ICD-10-CM | POA: Diagnosis not present

## 2020-03-06 LAB — HEPARIN LEVEL (UNFRACTIONATED)
Heparin Unfractionated: 0.41 IU/mL (ref 0.30–0.70)
Heparin Unfractionated: 0.22 IU/mL — ABNORMAL LOW (ref 0.30–0.70)
Heparin Unfractionated: 0.3 IU/mL (ref 0.30–0.70)

## 2020-03-06 LAB — BLOOD CULTURE ID PANEL (REFLEXED) - BCID2

## 2020-03-06 LAB — TROPONIN I (HIGH SENSITIVITY): Troponin I (High Sensitivity): 901 ng/L (ref <18)

## 2020-03-06 LAB — RESP PANEL BY RT-PCR (FLU A&B, COVID) ARPGX2
Influenza A by PCR: NEGATIVE
Influenza B by PCR: NEGATIVE
SARS Coronavirus 2 by RT PCR: NEGATIVE

## 2020-03-06 LAB — BASIC METABOLIC PANEL
Anion gap: 12 (ref 5–15)
BUN: 11 mg/dL (ref 8–23)
CO2: 22 mmol/L (ref 22–32)
Calcium: 9.2 mg/dL (ref 8.9–10.3)
Chloride: 105 mmol/L (ref 98–111)
Creatinine, Ser: 0.79 mg/dL (ref 0.44–1.00)
GFR, Estimated: >60 mL/min (ref >60)
Glucose, Bld: 146 mg/dL — ABNORMAL HIGH (ref 70–99)
Potassium: 3.5 mmol/L (ref 3.5–5.1)
Sodium: 139 mmol/L (ref 135–145)

## 2020-03-06 LAB — CBC
HCT: 44.1 % (ref 36.0–46.0)
Hemoglobin: 14.7 g/dL (ref 12.0–15.0)
MCH: 28.6 pg (ref 26.0–34.0)
MCHC: 33.3 g/dL (ref 30.0–36.0)
MCV: 85.8 fL (ref 80.0–100.0)
Platelets: 212 10*3/uL (ref 150–400)
RBC: 5.14 MIL/uL — ABNORMAL HIGH (ref 3.87–5.11)
RDW: 15.8 % — ABNORMAL HIGH (ref 11.5–15.5)
WBC: 9.9 10*3/uL (ref 4.0–10.5)
nRBC: 0 % (ref 0.0–0.2)

## 2020-03-06 LAB — GLUCOSE, CAPILLARY
Glucose-Capillary: 186 mg/dL — ABNORMAL HIGH (ref 70–99)
Glucose-Capillary: 196 mg/dL — ABNORMAL HIGH (ref 70–99)
Glucose-Capillary: 202 mg/dL — ABNORMAL HIGH (ref 70–99)
Glucose-Capillary: 155 mg/dL — ABNORMAL HIGH (ref 70–99)

## 2020-03-06 LAB — PROTIME-INR
INR: 0.9 (ref 0.8–1.2)
INR: 0.9 (ref 0.8–1.2)
Prothrombin Time: 11.9 seconds (ref 11.4–15.2)
Prothrombin Time: 12 seconds (ref 11.4–15.2)

## 2020-03-06 LAB — LACTIC ACID, PLASMA
Lactic Acid, Venous: 3 mmol/L (ref 0.5–1.9)
Lactic Acid, Venous: 1.5 mmol/L (ref 0.5–1.9)
Lactic Acid, Venous: 1.4 mmol/L (ref 0.5–1.9)

## 2020-03-06 LAB — HIV ANTIBODY (ROUTINE TESTING W REFLEX): HIV Screen 4th Generation wRfx: NONREACTIVE

## 2020-03-06 LAB — APTT: aPTT: 29 seconds (ref 24–36)

## 2020-03-06 MED ORDER — MORPHINE SULFATE (PF) 2 MG/ML IV SOLN: 2.0000 mg | INTRAVENOUS | Status: DC | PRN

## 2020-03-06 MED ORDER — SODIUM CHLORIDE 0.9 % WEIGHT BASED INFUSION
3.0000 mL/kg/h | INTRAVENOUS | Status: AC
  Administered 2020-03-07: 3 mL/kg/h via INTRAVENOUS

## 2020-03-06 MED ORDER — SODIUM CHLORIDE 0.9 % WEIGHT BASED INFUSION
1.0000 mL/kg/h | INTRAVENOUS | Status: DC
  Administered 2020-03-07 (×2): 1 mL/kg/h via INTRAVENOUS

## 2020-03-06 MED ORDER — IOHEXOL 300 MG/ML  SOLN
75.0000 mL | Freq: Once | INTRAMUSCULAR | Status: AC | PRN
  Administered 2020-03-06: 75 mL via INTRAVENOUS

## 2020-03-06 MED ORDER — DIPHENHYDRAMINE HCL 25 MG PO CAPS
50.0000 mg | ORAL_CAPSULE | Freq: Once | ORAL | Status: AC
  Administered 2020-03-06: 50 mg via ORAL
  Filled 2020-03-06: qty 2

## 2020-03-06 MED ORDER — SODIUM CHLORIDE 0.9% FLUSH: 3.0000 mL | INTRAVENOUS | Status: DC | PRN

## 2020-03-06 MED ORDER — SODIUM CHLORIDE 0.9 % IV SOLN: 250.0000 mL | INTRAVENOUS | Status: DC | PRN

## 2020-03-06 MED ORDER — ASCORBIC ACID 500 MG PO TABS
1000.0000 mg | ORAL_TABLET | Freq: Every day | ORAL | Status: DC
  Administered 2020-03-06 – 2020-03-08 (×3): 1000 mg via ORAL
  Filled 2020-03-06 (×3): qty 2

## 2020-03-06 MED ORDER — ASPIRIN EC 81 MG PO TBEC
81.0000 mg | DELAYED_RELEASE_TABLET | Freq: Once | ORAL | Status: AC
  Administered 2020-03-06: 81 mg via ORAL
  Filled 2020-03-06: qty 1

## 2020-03-06 MED ORDER — ONDANSETRON HCL 4 MG/2ML IJ SOLN: 4.0000 mg | Freq: Four times a day (QID) | INTRAMUSCULAR | Status: DC | PRN

## 2020-03-06 MED ORDER — ASPIRIN 300 MG RE SUPP: 300.0000 mg | RECTAL | Status: AC

## 2020-03-06 MED ORDER — IPRATROPIUM-ALBUTEROL 20-100 MCG/ACT IN AERS
2.0000 | INHALATION_SPRAY | Freq: Four times a day (QID) | RESPIRATORY_TRACT | Status: DC | PRN
  Filled 2020-03-06: qty 4

## 2020-03-06 MED ORDER — PANTOPRAZOLE SODIUM 40 MG PO TBEC
40.0000 mg | DELAYED_RELEASE_TABLET | Freq: Every day | ORAL | Status: DC
  Administered 2020-03-06 – 2020-03-08 (×2): 40 mg via ORAL
  Filled 2020-03-06 (×3): qty 1

## 2020-03-06 MED ORDER — ASPIRIN EC 81 MG PO TBEC: 81.0000 mg | DELAYED_RELEASE_TABLET | Freq: Every day | ORAL | Status: DC

## 2020-03-06 MED ORDER — ALPRAZOLAM 0.25 MG PO TABS: 0.2500 mg | ORAL_TABLET | Freq: Two times a day (BID) | ORAL | Status: DC | PRN

## 2020-03-06 MED ORDER — EMPAGLIFLOZIN 25 MG PO TABS
25.0000 mg | ORAL_TABLET | Freq: Every day | ORAL | Status: DC
  Administered 2020-03-06 – 2020-03-08 (×3): 25 mg via ORAL
  Filled 2020-03-06 (×3): qty 1

## 2020-03-06 MED ORDER — INSULIN ASPART 100 UNIT/ML ~~LOC~~ SOLN
0.0000 [IU] | Freq: Three times a day (TID) | SUBCUTANEOUS | Status: DC
  Administered 2020-03-06: 2 [IU] via SUBCUTANEOUS
  Administered 2020-03-06: 3 [IU] via SUBCUTANEOUS
  Administered 2020-03-06: 2 [IU] via SUBCUTANEOUS
  Administered 2020-03-07 (×2): 5 [IU] via SUBCUTANEOUS
  Administered 2020-03-08: 2 [IU] via SUBCUTANEOUS
  Filled 2020-03-06 (×6): qty 1

## 2020-03-06 MED ORDER — ASPIRIN 81 MG PO CHEW: 324.0000 mg | CHEWABLE_TABLET | ORAL | Status: AC

## 2020-03-06 MED ORDER — DULAGLUTIDE 1.5 MG/0.5ML ~~LOC~~ SOAJ: 1.5000 mg | SUBCUTANEOUS | Status: DC

## 2020-03-06 MED ORDER — HEPARIN SODIUM (PORCINE) 5000 UNIT/ML IJ SOLN
4000.0000 [IU] | Freq: Once | INTRAMUSCULAR | Status: AC
  Administered 2020-03-06: 4000 [IU] via INTRAVENOUS
  Filled 2020-03-06: qty 1

## 2020-03-06 MED ORDER — BUPROPION HCL ER (XL) 150 MG PO TB24
300.0000 mg | ORAL_TABLET | Freq: Every day | ORAL | Status: DC
  Administered 2020-03-06 – 2020-03-08 (×3): 300 mg via ORAL
  Filled 2020-03-06 (×3): qty 2

## 2020-03-06 MED ORDER — NITROGLYCERIN 2 % TD OINT
1.0000 [in_us] | TOPICAL_OINTMENT | Freq: Once | TRANSDERMAL | Status: AC
  Administered 2020-03-06: 1 [in_us] via TOPICAL
  Filled 2020-03-06: qty 1

## 2020-03-06 MED ORDER — ZOLPIDEM TARTRATE 5 MG PO TABS: 5.0000 mg | ORAL_TABLET | Freq: Every evening | ORAL | Status: DC | PRN

## 2020-03-06 MED ORDER — NITROGLYCERIN 0.4 MG SL SUBL: 0.4000 mg | SUBLINGUAL_TABLET | SUBLINGUAL | Status: DC | PRN

## 2020-03-06 MED ORDER — SODIUM CHLORIDE 0.9% FLUSH
3.0000 mL | Freq: Two times a day (BID) | INTRAVENOUS | Status: DC
  Administered 2020-03-06 – 2020-03-08 (×3): 3 mL via INTRAVENOUS

## 2020-03-06 MED ORDER — ASPIRIN 81 MG PO CHEW
81.0000 mg | CHEWABLE_TABLET | ORAL | Status: AC
  Administered 2020-03-07: 81 mg via ORAL
  Filled 2020-03-06: qty 1

## 2020-03-06 MED ORDER — HEPARIN (PORCINE) 25000 UT/250ML-% IV SOLN
1300.0000 [IU]/h | INTRAVENOUS | Status: DC
  Administered 2020-03-06: 1100 [IU]/h via INTRAVENOUS
  Administered 2020-03-06: 1250 [IU]/h via INTRAVENOUS
  Filled 2020-03-06 (×2): qty 250

## 2020-03-06 MED ORDER — MONTELUKAST SODIUM 10 MG PO TABS: 10.0000 mg | ORAL_TABLET | Freq: Every day | ORAL | Status: DC

## 2020-03-06 MED ORDER — HYDROCORTISONE NA SUCCINATE PF 250 MG IJ SOLR
200.0000 mg | Freq: Once | INTRAMUSCULAR | Status: AC
  Administered 2020-03-06: 200 mg via INTRAVENOUS
  Filled 2020-03-06: qty 200

## 2020-03-06 MED ORDER — ACETAMINOPHEN 325 MG PO TABS: 650.0000 mg | ORAL_TABLET | ORAL | Status: DC | PRN

## 2020-03-06 MED ORDER — FLUTICASONE PROPIONATE 50 MCG/ACT NA SUSP
1.0000 | Freq: Every day | NASAL | Status: DC
  Filled 2020-03-06: qty 16

## 2020-03-06 MED ORDER — HEPARIN BOLUS VIA INFUSION
1200.0000 [IU] | Freq: Once | INTRAVENOUS | Status: AC
  Administered 2020-03-06: 1200 [IU] via INTRAVENOUS
  Filled 2020-03-06: qty 1200

## 2020-03-06 MED ORDER — ATORVASTATIN CALCIUM 80 MG PO TABS: 80.0000 mg | ORAL_TABLET | Freq: Every day | ORAL | Status: DC

## 2020-03-06 MED ORDER — IPRATROPIUM-ALBUTEROL 0.5-2.5 (3) MG/3ML IN SOLN: 3.0000 mL | RESPIRATORY_TRACT | Status: DC | PRN

## 2020-03-06 MED ORDER — INSULIN GLARGINE 100 UNIT/ML ~~LOC~~ SOLN
20.0000 [IU] | Freq: Every day | SUBCUTANEOUS | Status: DC
  Administered 2020-03-06 – 2020-03-08 (×3): 20 [IU] via SUBCUTANEOUS
  Filled 2020-03-06 (×3): qty 0.2

## 2020-03-06 MED ORDER — METOPROLOL TARTRATE 25 MG PO TABS
25.0000 mg | ORAL_TABLET | Freq: Two times a day (BID) | ORAL | Status: DC
  Administered 2020-03-06 – 2020-03-08 (×6): 25 mg via ORAL
  Filled 2020-03-06 (×6): qty 1

## 2020-03-06 MED ORDER — DIPHENHYDRAMINE HCL 50 MG/ML IJ SOLN: 50.0000 mg | Freq: Once | INTRAMUSCULAR | Status: AC

## 2020-03-06 NOTE — Consult Note (Signed)
Cardiology Consultation Note    [name], MRN: [id number], DOB/AGE: [birth year] 64 y.o. Admit date: 03/05/2020   Date of Consult: 03/06/2020 Primary Physician: Einar Pheasant, MD Primary Cardiologist: none  Chief Complaint: chest pain Reason for Consultation: abnormal troponin Requesting MD: Dr. Verlon Au  HPI: Amanda Wiley is a 64 y.o. female with history of diabetes, hypertension, sleep apnea, irritable bowel syndrome who presented with acute onset of midsternal chest pain that awoke her from sleep.  She had a sharp pain with radiation to her right arm associated with tingling.  Has been waxing and waning over the previous several days.  She denied extended travel.  In the emergency room she was hypertensive with a pressure of 168/82 with a high-sensitivity troponin of 705 with a subsequent value of 901.  EKG revealed no ischemic changes sinus rhythm.  Chest x-ray revealed no pulmonary edema with no pleural effusion.  She was noted to have a retrocardiac airspace opacity.  PA and lateral chest x-ray are pending.  Echocardiogram is pending.  Patient was started on heparin drip given aspirin and placed on nitro paste.  Risk factors include previous tobacco abuse, quit in 1999, family history, hypertension.  As an outpatient she was on lovastatin 10 mg daily, Metformin, Jardiance, Toprol tartrate 25 twice daily.  Past Medical History:  Diagnosis Date  . Arthritis   . Asthma   . Complication of anesthesia    HARD TO WAKE UP  . COPD (chronic obstructive pulmonary disease) ([place])   . Crohn's disease ([place])   . Depression   . Diabetes mellitus ([place])   . Fracture, foot    post fall  . Hypercholesterolemia   . Hypertension   . IBS (irritable bowel syndrome)   . Nephrolithiasis    s/p lithotripsy [facility])  . Vitamin D deficiency       Surgical History:  Past Surgical History:  Procedure Laterality Date  . ABDOMINAL HYSTERECTOMY  1985  . APPENDECTOMY   1985  . BREAST DUCTAL SYSTEM EXCISION Left 09/04/2019   Procedure: EXCISION DUCTAL SYSTEM BREAST;  Surgeon: Robert Bellow, MD;  Location: [facility] ORS;  Service: General;  Laterality: Left;  . BREAST EXCISIONAL BIOPSY Right 2013   benign  . COLONOSCOPY  2014  . COLONOSCOPY WITH PROPOFOL N/A 02/10/2020   Procedure: COLONOSCOPY WITH PROPOFOL;  Surgeon: Robert Bellow, MD;  Location: [facility] ENDOSCOPY;  Service: Endoscopy;  Laterality: N/A;  . EXCISION / BIOPSY BREAST / NIPPLE / DUCT Left 2019   papilloma  . EXCISION OF BREAST BIOPSY Left 2019   papilloma  . LAPAROSCOPIC CHOLECYSTECTOMY  2005   Dr Tamala Julian  . LAPAROSCOPIC SUPRACERVICAL HYSTERECTOMY  1985   left ovary not removed  . LITHOTRIPSY     x7  . OOPHORECTOMY    . POLYPECTOMY    . SHOULDER SURGERY     right (pinning)  . SHOULDER SURGERY     left (pinning)  . TONSILLECTOMY  1966     Home Meds: Prior to Admission medications   Medication Sig Start Date End Date Taking? Authorizing Provider  acetaminophen (TYLENOL) 325 MG tablet Take 2 tablets (650 mg total) by mouth every 6 (six) hours as needed for mild pain (or Fever >/= 101). 02/03/19  Yes Flora Lipps, MD  albuterol (PROVENTIL) (2.5 MG/3ML) 0.083% nebulizer solution Take 2.5 mg by nebulization every 6 (six) hours as needed for wheezing.   Yes [provider]  aspirin EC 81 MG tablet Take  81 mg by mouth once. Swallow whole.   Yes [provider]  buPROPion (WELLBUTRIN XL) 300 MG 24 hr tablet Take 1 tablet (300 mg total) by mouth daily. 01/06/20  Yes Einar Pheasant, MD  Dulaglutide (TRULICITY) 1.5 ZO/1.0RU SOPN Inject 1.5 mg into the skin once a week. ON SUNDAYS   Yes [provider]  fluticasone-salmeterol (ADVAIR HFA) 230-21 MCG/ACT inhaler Inhale 2 puffs into the lungs 2 (two) times daily.   Yes [provider]  hyoscyamine (LEVSIN SL) 0.125 MG SL tablet Place 0.125 mg under the tongue as needed.   Yes [provider]  insulin  glargine (LANTUS) 100 UNIT/ML injection Inject 20 Units into the skin at bedtime.    Yes [provider]  ipratropium (ATROVENT) 0.06 % nasal spray Place 2 sprays into both nostrils 3 (three) times daily as needed for rhinitis.    Yes [provider]  Ipratropium-Albuterol (COMBIVENT) 20-100 MCG/ACT AERS respimat Inhale 2 puffs into the lungs every 4 (four) hours while awake. Patient taking differently: Inhale 1 puff into the lungs 2 (two) times daily. And as needed. 02/16/19  Yes Einar Pheasant, MD  JARDIANCE 25 MG TABS tablet Take 25 mg by mouth daily. 04/24/19  Yes [provider]  LORazepam (ATIVAN) 0.5 MG tablet Take 1/2 tablet prior to MRI.  May repeat x 1 prior to MRI if needed. 08/10/19  Yes Einar Pheasant, MD  lovastatin (MEVACOR) 10 MG tablet Take one tablet q week. 02/16/20  Yes Einar Pheasant, MD  metFORMIN (GLUCOPHAGE) 500 MG tablet Take 2 tablets (1,000 mg total) by mouth 2 (two) times daily. 01/06/20  Yes Einar Pheasant, MD  metoprolol tartrate (LOPRESSOR) 25 MG tablet Take 1 tablet (25 mg total) by mouth 2 (two) times daily. 01/06/20  Yes Einar Pheasant, MD  RABEprazole (ACIPHEX) 20 MG tablet Take 1 tablet (20 mg total) by mouth in the morning and at bedtime. 01/06/20  Yes Einar Pheasant, MD  vitamin C (VITAMIN C) 1000 MG tablet Take 1 tablet (1,000 mg total) by mouth daily. 02/03/19  Yes Flora Lipps, MD  ULTRACARE PEN NEEDLES 32G X 4 MM MISC  02/10/19   [provider]    Inpatient Medications:  . ascorbic acid  1,000 mg Oral Daily  . aspirin  324 mg Oral NOW   Or  . aspirin  300 mg Rectal NOW  . [START ON 03/07/2020] aspirin EC  81 mg Oral Daily  . atorvastatin  80 mg Oral q1800  . buPROPion  300 mg Oral Daily  . diphenhydrAMINE  50 mg Oral Once   Or  . diphenhydrAMINE  50 mg Intravenous Once  . Dulaglutide  1.5 mg Subcutaneous Weekly  . empagliflozin  25 mg Oral Daily  . fluticasone  1 spray Each Nare Daily  . insulin aspart  0-9 Units  Subcutaneous TID WC  . insulin glargine  20 Units Subcutaneous Daily  . metoprolol tartrate  25 mg Oral BID  . montelukast  10 mg Oral QHS  . pantoprazole  40 mg Oral Daily   . heparin 1,100 Units/hr (03/06/20 0454)    Allergies:  Allergies  Allergen Reactions  . Avelox [Moxifloxacin Hcl In Nacl] Hives, Shortness Of Breath and Other (See Comments)  . Moxifloxacin Hives and Shortness Of Breath  . Allegra [Fexofenadine Hcl] Other (See Comments)    headaches  . Ibuprofen Swelling    FACIAL  . Sulfa Antibiotics Other (See Comments)    Difficulty breathing, rash, tongue swelling  .  Tegretol [Carbamazepine] Other (See Comments)    Other reaction(s): Other (See Comments) Vomiting Vomiting   . Iodine Rash  . Ioxaglate Rash  . Ivp Dye [Iodinated Diagnostic Agents] Rash    Social History   Socioeconomic History  . Marital status: Married    Spouse name: Not on file  . Number of children: 2  . Years of education: Not on file  . Highest education level: Not on file  Occupational History    Employer: [employer]  Tobacco Use  . Smoking status: Former Smoker    Packs/day: 2.00    Years: 15.00    Pack years: 30.00    Quit date: 04/09/1997    Years since quitting: 22.9  . Smokeless tobacco: Never Used  Vaping Use  . Vaping Use: Never used  Substance and Sexual Activity  . Alcohol use: Never    Alcohol/week: 0.0 standard drinks  . Drug use: No  . Sexual activity: Not on file  Other Topics Concern  . Not on file  Social History Narrative  . Not on file   Social Determinants of Health   Financial Resource Strain:   . Difficulty of Paying Living Expenses: Not on file  Food Insecurity:   . Worried About Charity fundraiser in the Last Year: Not on file  . Ran Out of Food in the Last Year: Not on file  Transportation Needs:   . Lack of Transportation (Medical): Not on file  . Lack of Transportation (Non-Medical): Not on file  Physical Activity:   . Days of Exercise  per Week: Not on file  . Minutes of Exercise per Session: Not on file  Stress:   . Feeling of Stress : Not on file  Social Connections:   . Frequency of Communication with Friends and Family: Not on file  . Frequency of Social Gatherings with Friends and Family: Not on file  . Attends Religious Services: Not on file  . Active Member of Clubs or Organizations: Not on file  . Attends Archivist Meetings: Not on file  . Marital Status: Not on file  Intimate Partner Violence:   . Fear of Current or Ex-Partner: Not on file  . Emotionally Abused: Not on file  . Physically Abused: Not on file  . Sexually Abused: Not on file     Family History  Problem Relation Age of Onset  . Heart disease Father   . Emphysema Maternal Grandmother   . COPD Mother   . Hypercholesterolemia Mother   . Emphysema Mother   . Hypertension Brother   . Emphysema Other        grandmother  . COPD Sister   . Crohn's disease Sister   . Hypertension Sister   . Breast cancer Neg Hx   . Colon cancer Neg Hx      Review of Systems: A 12-system review of systems was performed and is negative except as noted in the HPI.  Labs: No results for input(s): CKTOTAL, CKMB, TROPONINI in the last 72 hours. Lab Results  Component Value Date   WBC 9.9 03/06/2020   HGB 14.7 03/06/2020   HCT 44.1 03/06/2020   MCV 85.8 03/06/2020   PLT 212 03/06/2020    Recent Labs  Lab 03/06/20 0435  NA 139  K 3.5  CL 105  CO2 22  BUN 11  CREATININE 0.79  CALCIUM 9.2  GLUCOSE 146*   Lab Results  Component Value Date   CHOL 222 (H) 02/12/2020  HDL 40.50 02/12/2020   LDLCALC 148 (H) 07/24/2013   TRIG 330.0 (H) 02/12/2020   Lab Results  Component Value Date   DDIMER 0.60 (H) 02/10/2019    Radiology/Studies:  DG Chest 2 View  Result Date: 03/05/2020 CLINICAL DATA:  Chest pain.  Right arm into jaw. EXAM: CHEST - 2 VIEW COMPARISON:  Chest x-ray 02/07/2019 FINDINGS: The heart size and mediastinal contours  unchanged. Biapical pleural/pulmonary scarring. Interval development of a retrocardiac airspace opacity. Similar appearing coarsened interstitial markings. No pulmonary edema. No pleural effusion. No pneumothorax. No acute osseous abnormality.  Right humeral surgical hardware. IMPRESSION: Interval development of a retrocardiac airspace opacity that could represent infection or inflammation. Followup PA and lateral chest X-ray is recommended in 3-4 weeks following trial therapy to ensure resolution and exclude underlying malignancy. Electronically Signed   By: Iven Finn M.D.   On: 03/05/2020 22:59    Wt Readings from Last 3 Encounters:  03/06/20 99.9 kg  02/16/20 99.8 kg  02/10/20 97.5 kg    EKG: Normal sinus rhythm with no ischemia  Physical Exam:  Blood pressure (!) 104/57, pulse 63, temperature (!) 97.3 F (36.3 C), temperature source Oral, resp. rate 19, height 5' 5"  (1.651 m), weight 99.9 kg, SpO2 95 %. Body mass index is 36.64 kg/m. General: Well developed, well nourished, in no acute distress. Head: Normocephalic, atraumatic, sclera non-icteric, no xanthomas, nares are without discharge.  Neck: Negative for carotid bruits. JVD not elevated. Lungs: Clear bilaterally to auscultation without wheezes, rales, or rhonchi. Breathing is unlabored. Heart: RRR with S1 S2. No murmurs, rubs, or gallops appreciated. Abdomen: Soft, non-tender, non-distended with normoactive bowel sounds. No hepatomegaly. No rebound/guarding. No obvious abdominal masses. Msk:  Strength and tone appear normal for age. Extremities: No clubbing or cyanosis. No edema.  Distal pedal pulses are 2+ and equal bilaterally. Neuro: Alert and oriented X 3. No facial asymmetry. No focal deficit. Moves all extremities spontaneously. Psych:  Responds to questions appropriately with a normal affect.     Assessment and Plan  64 year old female with diabetes hypertension sleep apnea with an unremarkable cardiac  catheterization approximately 15 years ago who presented with episodes of midsternal chest pain with radiation to both arms.  EKG showed no acute ischemia however initial troponin was 705 with a subsequent value of 901.  Patient's risk factors include previous tobacco use, family history, hypertension and hyperlipidemia.  She has been on low-dose lovastatin due to intolerance of more intense statins.  It is of note her symptoms started after starting lovastatin however somewhat atypical for lovastatin pain.  Given elevated troponin and risk factor we will proceed with left heart cath tomorrow morning per Dr. Saralyn Pilar.  Chest CT to evaluate retrocardiac abnormality is pending.  Should this show significant abnormality, further recommendations are forthcoming.  Signed, Teodoro Spray MD 03/06/2020, 8:59 AM Pager: [phone]

## 2020-03-06 NOTE — Progress Notes (Signed)
ANTICOAGULATION CONSULT NOTE  Pharmacy Consult for Heparin Indication: chest pain/ACS  Patient Measurements: Height: 5' 5"  (165.1 cm) Weight: 99.9 kg (220 lb 3.2 oz) IBW/kg (Calculated) : 57 HEPARIN DW (KG): 79.8  Vital Signs: Temp: 97.3 F (36.3 C) (11/28 0808) Temp Source: Oral (11/28 0808) BP: 104/57 (11/28 0808) Pulse Rate: 63 (11/28 0808)  Labs: Recent Labs    03/05/20 2237 03/06/20 0034 03/06/20 0036 03/06/20 0435  HGB 15.6*  --   --  14.7  HCT 47.6*  --   --  44.1  PLT 216  --   --  212  APTT  --   --  29  --   LABPROT  --  11.9 12.0  --   INR  --  0.9 0.9  --   HEPARINUNFRC  --   --   --  0.41  CREATININE 0.96  --   --  0.79  TROPONINIHS 705* 901*  --   --    Estimated Creatinine Clearance: 82.1 mL/min (by C-G formula based on SCr of 0.79 mg/dL).  Medical History: Past Medical History:  Diagnosis Date  . Arthritis   . Asthma   . Complication of anesthesia    HARD TO WAKE UP  . COPD (chronic obstructive pulmonary disease) ([place])   . Crohn's disease ([place])   . Depression   . Diabetes mellitus ([place])   . Fracture, foot    post fall  . Hypercholesterolemia   . Hypertension   . IBS (irritable bowel syndrome)   . Nephrolithiasis    s/p lithotripsy [facility])  . Vitamin D deficiency    Medications:  Medications Prior to Admission  Medication Sig Dispense Refill Last Dose  . acetaminophen (TYLENOL) 325 MG tablet Take 2 tablets (650 mg total) by mouth every 6 (six) hours as needed for mild pain (or Fever >/= 101).   prn at prn  . albuterol (PROVENTIL) (2.5 MG/3ML) 0.083% nebulizer solution Take 2.5 mg by nebulization every 6 (six) hours as needed for wheezing.   prn at prn  . aspirin EC 81 MG tablet Take 81 mg by mouth once. Swallow whole.   03/05/2020 at 2030  . buPROPion (WELLBUTRIN XL) 300 MG 24 hr tablet Take 1 tablet (300 mg total) by mouth daily. 90 tablet 1 03/05/2020 at 0830  . Dulaglutide (TRULICITY) 1.5 OP/9.2TW SOPN Inject 1.5 mg  into the skin once a week. ON SUNDAYS   02/28/2020 at 0830  . fluticasone-salmeterol (ADVAIR HFA) 230-21 MCG/ACT inhaler Inhale 2 puffs into the lungs 2 (two) times daily.   03/05/2020 at 0830  . hyoscyamine (LEVSIN SL) 0.125 MG SL tablet Place 0.125 mg under the tongue as needed.   prn at prn  . insulin glargine (LANTUS) 100 UNIT/ML injection Inject 20 Units into the skin at bedtime.    03/04/2020 at 2300  . ipratropium (ATROVENT) 0.06 % nasal spray Place 2 sprays into both nostrils 3 (three) times daily as needed for rhinitis.    prn at prn  . Ipratropium-Albuterol (COMBIVENT) 20-100 MCG/ACT AERS respimat Inhale 2 puffs into the lungs every 4 (four) hours while awake. (Patient taking differently: Inhale 1 puff into the lungs 2 (two) times daily. And as needed.) 4 g 2 03/05/2020 at 0830  . JARDIANCE 25 MG TABS tablet Take 25 mg by mouth daily.   03/05/2020 at 0830  . LORazepam (ATIVAN) 0.5 MG tablet Take 1/2 tablet prior to MRI.  May repeat x 1 prior to MRI if needed. 2  tablet 0 03/05/2020 at prn  . lovastatin (MEVACOR) 10 MG tablet Take one tablet q week. 15 tablet 1 03/02/2020 at 0830  . metFORMIN (GLUCOPHAGE) 500 MG tablet Take 2 tablets (1,000 mg total) by mouth 2 (two) times daily. 360 tablet 1 03/05/2020 at 0830  . metoprolol tartrate (LOPRESSOR) 25 MG tablet Take 1 tablet (25 mg total) by mouth 2 (two) times daily. 180 tablet 1 03/05/2020 at 0830  . RABEprazole (ACIPHEX) 20 MG tablet Take 1 tablet (20 mg total) by mouth in the morning and at bedtime. 180 tablet 1 03/05/2020 at 0830  . vitamin C (VITAMIN C) 1000 MG tablet Take 1 tablet (1,000 mg total) by mouth daily.   Past Week at Unknown time  . ULTRACARE PEN NEEDLES 32G X 4 MM MISC        Assessment: 64 y.o. Caucasian female with a known history of type 2 diabetes mellitus, hypertension, sleep edema, IBS, asthma/COPD and Crohn's disease, who presented to the emergency room with acute onset of midsternal chest pain, HS troponin 705 >> 901,  H&H, platelets stable.  No chronic anticoagulants prior to arrival  Goal of Therapy:  Heparin level 0.3-0.7 units/ml Monitor platelets by anticoagulation protocol: Yes   Plan:   repeat anti-Xa level sub-therapeutic:   Bolus 1200 units heparin  increase infusion rate to 1250 units/hr  Re-check anti-Xa level in 6 hours after heparin rate change  Repeat CBC in am  Dallie Piles 03/06/2020,9:15 AM

## 2020-03-06 NOTE — H&P (Signed)
[place]   PATIENT NAME: Amanda Wiley    MR#:  [id number]  DATE OF BIRTH:  1955-11-27  DATE OF ADMISSION:  03/05/2020  PRIMARY CARE PHYSICIAN: Einar Pheasant, MD   REQUESTING/REFERRING PHYSICIAN: Lurline Hare, MD  CHIEF COMPLAINT:   Chief Complaint  Patient presents with  . Chest Pain    HISTORY OF PRESENT ILLNESS:  Amanda Wiley  is a 64 y.o. Caucasian female with a known history of type 2 diabetes mellitus, hypertension, sleep edema, IBS, asthma/COPD and Crohn's disease, who presented to the emergency room with acute onset of midsternal chest pain that woke her up from sleep yesterday morning and was felt as a sharp pain with radiation to her right arm with associated tingling.  She admitted to nausea without vomiting with it.  It has been waxing and waning over the last couple of days.  She denied any dyspnea or palpitations.  No leg pain or edema recent travels or surgeries.  No cough or wheezing or hemoptysis.  No bleeding diathesis.  Upon presentation to the emergency room, blood pressure was 168/82 with otherwise normal vital signs.  Labs revealed hyperglycemia of 211.  High-sensitivity troponin I came back significantly elevated at 705 and CBC showed hemoconcentration.  Two-view chest x-ray showed interval development of retrocardiac airspace opacity that could represent infection or inflammation with recommendation for PA and lateral chest x-ray. EKG showed normal sinus rhythm with a rate of 95 with low voltage QRS and T wave inversion in V1 and V2.  Repeat EKG showed no sinus rhythm rate of 94 with similar findings with T wave inversion in V1 through V3.  The patient was given 3 baby aspirin, initial Nitropaste, IV heparin bolus and drip.  He will be admitted to a progressive unit bed for further evaluation and management.  PAST MEDICAL HISTORY:   Past Medical History:  Diagnosis Date  . Arthritis   . Asthma   . Complication of anesthesia    HARD TO WAKE UP  .  COPD (chronic obstructive pulmonary disease) ([place])   . Crohn's disease ([place])   . Depression   . Diabetes mellitus ([place])   . Fracture, foot    post fall  . Hypercholesterolemia   . Hypertension   . IBS (irritable bowel syndrome)   . Nephrolithiasis    s/p lithotripsy [facility])  . Vitamin D deficiency     PAST SURGICAL HISTORY:   Past Surgical History:  Procedure Laterality Date  . ABDOMINAL HYSTERECTOMY  1985  . APPENDECTOMY  1985  . BREAST DUCTAL SYSTEM EXCISION Left 09/04/2019   Procedure: EXCISION DUCTAL SYSTEM BREAST;  Surgeon: Robert Bellow, MD;  Location: [facility] ORS;  Service: General;  Laterality: Left;  . BREAST EXCISIONAL BIOPSY Right 2013   benign  . COLONOSCOPY  2014  . COLONOSCOPY WITH PROPOFOL N/A 02/10/2020   Procedure: COLONOSCOPY WITH PROPOFOL;  Surgeon: Robert Bellow, MD;  Location: [facility] ENDOSCOPY;  Service: Endoscopy;  Laterality: N/A;  . EXCISION / BIOPSY BREAST / NIPPLE / DUCT Left 2019   papilloma  . EXCISION OF BREAST BIOPSY Left 2019   papilloma  . LAPAROSCOPIC CHOLECYSTECTOMY  2005   Dr Tamala Julian  . LAPAROSCOPIC SUPRACERVICAL HYSTERECTOMY  1985   left ovary not removed  . LITHOTRIPSY     x7  . OOPHORECTOMY    . POLYPECTOMY    . SHOULDER SURGERY     right (pinning)  . SHOULDER SURGERY     left (pinning)  .  TONSILLECTOMY  1966    SOCIAL HISTORY:   Social History   Tobacco Use  . Smoking status: Former Smoker    Packs/day: 2.00    Years: 15.00    Pack years: 30.00    Quit date: 04/09/1997    Years since quitting: 22.9  . Smokeless tobacco: Never Used  Substance Use Topics  . Alcohol use: Never    Alcohol/week: 0.0 standard drinks    FAMILY HISTORY:   Family History  Problem Relation Age of Onset  . Heart disease Father   . Emphysema Maternal Grandmother   . COPD Mother   . Hypercholesterolemia Mother   . Emphysema Mother   . Hypertension Brother   . Emphysema Other        grandmother  . COPD Sister   .  Crohn's disease Sister   . Hypertension Sister   . Breast cancer Neg Hx   . Colon cancer Neg Hx     DRUG ALLERGIES:   Allergies  Allergen Reactions  . Avelox [Moxifloxacin Hcl In Nacl] Hives, Shortness Of Breath and Other (See Comments)  . Moxifloxacin Hives and Shortness Of Breath  . Allegra [Fexofenadine Hcl] Other (See Comments)    headaches  . Ibuprofen Swelling    FACIAL  . Sulfa Antibiotics Other (See Comments)    Difficulty breathing, rash, tongue swelling  . Tegretol [Carbamazepine] Other (See Comments)    Other reaction(s): Other (See Comments) Vomiting Vomiting   . Iodine Rash  . Ioxaglate Rash  . Ivp Dye [Iodinated Diagnostic Agents] Rash    REVIEW OF SYSTEMS:   ROS As per history of present illness. All pertinent systems were reviewed above. Constitutional, HEENT, cardiovascular, respiratory, GI, GU, musculoskeletal, neuro, psychiatric, endocrine, integumentary and hematologic systems were reviewed and are otherwise negative/unremarkable except for positive findings mentioned above in the HPI.   MEDICATIONS AT HOME:   Prior to Admission medications   Medication Sig Start Date End Date Taking? Authorizing Provider  acetaminophen (TYLENOL) 325 MG tablet Take 2 tablets (650 mg total) by mouth every 6 (six) hours as needed for mild pain (or Fever >/= 101). 02/03/19   Flora Lipps, MD  buPROPion (WELLBUTRIN XL) 300 MG 24 hr tablet Take 1 tablet (300 mg total) by mouth daily. 01/06/20   Einar Pheasant, MD  Dulaglutide (TRULICITY) 1.5 HB/7.1IR SOPN Inject 1.5 mg into the skin once a week. ON SUNDAYS    [provider]  fluconazole (DIFLUCAN) 150 MG tablet Take one tablet by mouth for one dose. MAY REPEAT X1 IF PERSISTENT SYMPTOMS AFTER 3 DAYS 11/13/19   Einar Pheasant, MD  fluticasone [facility]) 50 MCG/ACT nasal spray Place 1 spray into both nostrils as needed.     [provider]  fluticasone-salmeterol (ADVAIR HFA) 230-21 MCG/ACT inhaler Inhale 2  puffs into the lungs 2 (two) times daily.    [provider]  insulin glargine (LANTUS) 100 UNIT/ML injection Inject 20 Units into the skin at bedtime.     [provider]  Ipratropium-Albuterol (COMBIVENT) 20-100 MCG/ACT AERS respimat Inhale 2 puffs into the lungs every 4 (four) hours while awake. Patient taking differently: Inhale 2 puffs into the lungs in the morning and at bedtime.  02/16/19   Einar Pheasant, MD  JARDIANCE 25 MG TABS tablet Take 25 mg by mouth daily. 04/24/19   [provider]  LORazepam (ATIVAN) 0.5 MG tablet Take 1/2 tablet prior to MRI.  May repeat x 1 prior to MRI if needed. Patient not taking: Reported  on 08/31/2019 08/10/19   Einar Pheasant, MD  lovastatin (MEVACOR) 10 MG tablet Take one tablet q week. 02/16/20   Einar Pheasant, MD  metFORMIN (GLUCOPHAGE) 500 MG tablet Take 2 tablets (1,000 mg total) by mouth 2 (two) times daily. 01/06/20   Einar Pheasant, MD  metoprolol tartrate (LOPRESSOR) 25 MG tablet Take 1 tablet (25 mg total) by mouth 2 (two) times daily. 01/06/20   Einar Pheasant, MD  montelukast (SINGULAIR) 10 MG tablet Take 10 mg by mouth at bedtime.    [provider]  RABEprazole (ACIPHEX) 20 MG tablet Take 1 tablet (20 mg total) by mouth in the morning and at bedtime. 01/06/20   Einar Pheasant, MD  ULTRACARE PEN NEEDLES 32G X 4 MM MISC  02/10/19   [provider]  vitamin C (VITAMIN C) 1000 MG tablet Take 1 tablet (1,000 mg total) by mouth daily. 02/03/19   Flora Lipps, MD      VITAL SIGNS:  Blood pressure (!) 168/82, pulse 93, temperature 98.6 F (37 C), temperature source Oral, resp. rate 16, height 5' 6"  (1.676 m), weight 99.8 kg, SpO2 95 %.  PHYSICAL EXAMINATION:  Physical Exam  GENERAL:  64 y.o.-year-old Caucasian female patient lying in the bed with no acute distress.  EYES: Pupils equal, round, reactive to light and accommodation. No scleral icterus. Extraocular muscles intact.  HEENT: Head atraumatic,  normocephalic. Oropharynx and nasopharynx clear.  NECK:  Supple, no jugular venous distention. No thyroid enlargement, no tenderness.  LUNGS: Normal breath sounds bilaterally, no wheezing, rales,rhonchi or crepitation. No use of accessory muscles of respiration.  CARDIOVASCULAR: Regular rate and rhythm, S1, S2 normal. No murmurs, rubs, or gallops.  ABDOMEN: Soft, nondistended, nontender. Bowel sounds present. No organomegaly or mass.  EXTREMITIES: No pedal edema, cyanosis, or clubbing.  NEUROLOGIC: Cranial nerves II through XII are intact. Muscle strength 5/5 in all extremities. Sensation intact. Gait not checked.  PSYCHIATRIC: The patient is alert and oriented x 3.  Normal affect and good eye contact. SKIN: No obvious rash, lesion, or ulcer.   LABORATORY PANEL:   CBC Recent Labs  Lab 03/05/20 2237  WBC 9.8  HGB 15.6*  HCT 47.6*  PLT 216   ------------------------------------------------------------------------------------------------------------------  Chemistries  Recent Labs  Lab 03/05/20 2237  NA 142  K 3.9  CL 109  CO2 19*  GLUCOSE 211*  BUN 13  CREATININE 0.96  CALCIUM 9.5   ------------------------------------------------------------------------------------------------------------------  Cardiac Enzymes No results for input(s): TROPONINI in the last 168 hours. ------------------------------------------------------------------------------------------------------------------  RADIOLOGY:  DG Chest 2 View  Result Date: 03/05/2020 CLINICAL DATA:  Chest pain.  Right arm into jaw. EXAM: CHEST - 2 VIEW COMPARISON:  Chest x-ray 02/07/2019 FINDINGS: The heart size and mediastinal contours unchanged. Biapical pleural/pulmonary scarring. Interval development of a retrocardiac airspace opacity. Similar appearing coarsened interstitial markings. No pulmonary edema. No pleural effusion. No pneumothorax. No acute osseous abnormality.  Right humeral surgical hardware. IMPRESSION:  Interval development of a retrocardiac airspace opacity that could represent infection or inflammation. Followup PA and lateral chest X-ray is recommended in 3-4 weeks following trial therapy to ensure resolution and exclude underlying malignancy. Electronically Signed   By: Iven Finn M.D.   On: 03/05/2020 22:59      IMPRESSION AND PLAN:   1.  Non-ST elevation myocardial infarction. -The patient will be admitted to a progressive unit bed. -We will continue her on IV heparin as well as aspirin. -We will resume her beta-blocker therapy with Lopressor. -We will continue her statin  therapy and check fasting lipids in a.m. -Cardiology consultation will be obtained.  I notified Dr. Ubaldo Glassing about the patient.  2.  Abnormal chest x-ray. -The patient has no symptoms to suggest pneumonia. -We will obtain a chest CTA to assess for pneumonia and rule out PE.  3.  Type 2 diabetes mellitus. -The patient will be placed on supplemental coverage with NovoLog and will continue her basal coverage. -Metformin will be held off. -We will continue Jardiance  4.  Dyslipidemia. -Statin therapy will be resumed.  5.  Anxiety and depression. -We will continue Ativan and Wellbutrin XL.  6.  GERD. -Statin therapy will be resumed.  7.  DVT prophylaxis. -Subcutaneous Lovenox.   All the records are reviewed and case discussed with ED provider. The plan of care was discussed in details with the patient (and family). I answered all questions. The patient agreed to proceed with the above mentioned plan. Further management will depend upon hospital course.   CODE STATUS: Full code  Status is: Inpatient  Remains inpatient appropriate because:Ongoing active pain requiring inpatient pain management, Ongoing diagnostic testing needed not appropriate for outpatient work up, Unsafe d/c plan, IV treatments appropriate due to intensity of illness or inability to take PO and Inpatient level of care appropriate due  to severity of illness   Dispo: The patient is from: Home              Anticipated d/c is to: Home              Anticipated d/c date is: 2 days              Patient currently is not medically stable to d/c.   TOTAL TIME TAKING CARE OF THIS PATIENT: 55 minutes.    Christel Mormon M.D on 03/06/2020 at 12:19 AM  [facility]   From 7 PM-7 AM, contact night-coverage [email or web address]  CC: Primary care physician; Einar Pheasant, MD

## 2020-03-06 NOTE — Progress Notes (Signed)
Pt advised RN she desires to be a Full Code status and rescinds her DNR status

## 2020-03-06 NOTE — ED Notes (Signed)
Date and time results received: 03/06/20 0123 (use smartphrase ".now" to insert current time)  Test: Troponin  Critical Value: 901  Name of Provider Notified: Sidney Ace, MD

## 2020-03-06 NOTE — Progress Notes (Signed)
ANTICOAGULATION CONSULT NOTE  Pharmacy Consult for Heparin Indication: chest pain/ACS  Patient Measurements: Height: 5' 5"  (165.1 cm) Weight: 99.9 kg (220 lb 3.2 oz) IBW/kg (Calculated) : 57 HEPARIN DW (KG): 79.8  Vital Signs: Temp: 98.2 F (36.8 C) (11/28 1931) Temp Source: Oral (11/28 1931) BP: 112/55 (11/28 1931) Pulse Rate: 73 (11/28 1931)  Labs: Recent Labs    03/05/20 2237 03/06/20 0034 03/06/20 0036 03/06/20 0435 03/06/20 1141 03/06/20 2211  HGB 15.6*  --   --  14.7  --   --   HCT 47.6*  --   --  44.1  --   --   PLT 216  --   --  212  --   --   APTT  --   --  29  --   --   --   LABPROT  --  11.9 12.0  --   --   --   INR  --  0.9 0.9  --   --   --   HEPARINUNFRC  --   --   --  0.41 0.22* 0.30  CREATININE 0.96  --   --  0.79  --   --   TROPONINIHS 705* 901*  --   --   --   --    Estimated Creatinine Clearance: 82.1 mL/min (by C-G formula based on SCr of 0.79 mg/dL).  Medical History: Past Medical History:  Diagnosis Date  . Arthritis   . Asthma   . Complication of anesthesia    HARD TO WAKE UP  . COPD (chronic obstructive pulmonary disease) ([place])   . Crohn's disease ([place])   . Depression   . Diabetes mellitus ([place])   . Fracture, foot    post fall  . Hypercholesterolemia   . Hypertension   . IBS (irritable bowel syndrome)   . Nephrolithiasis    s/p lithotripsy [facility])  . Vitamin D deficiency    Medications:  Medications Prior to Admission  Medication Sig Dispense Refill Last Dose  . acetaminophen (TYLENOL) 325 MG tablet Take 2 tablets (650 mg total) by mouth every 6 (six) hours as needed for mild pain (or Fever >/= 101).   prn at prn  . albuterol (PROVENTIL) (2.5 MG/3ML) 0.083% nebulizer solution Take 2.5 mg by nebulization every 6 (six) hours as needed for wheezing.   prn at prn  . aspirin EC 81 MG tablet Take 81 mg by mouth once. Swallow whole.   03/05/2020 at 2030  . buPROPion (WELLBUTRIN XL) 300 MG 24 hr tablet Take 1 tablet (300  mg total) by mouth daily. 90 tablet 1 03/05/2020 at 0830  . Dulaglutide (TRULICITY) 1.5 GQ/6.7YP SOPN Inject 1.5 mg into the skin once a week. ON SUNDAYS   02/28/2020 at 0830  . fluticasone-salmeterol (ADVAIR HFA) 230-21 MCG/ACT inhaler Inhale 2 puffs into the lungs 2 (two) times daily.   03/05/2020 at 0830  . hyoscyamine (LEVSIN SL) 0.125 MG SL tablet Place 0.125 mg under the tongue as needed.   prn at prn  . insulin glargine (LANTUS) 100 UNIT/ML injection Inject 20 Units into the skin at bedtime.    03/04/2020 at 2300  . ipratropium (ATROVENT) 0.06 % nasal spray Place 2 sprays into both nostrils 3 (three) times daily as needed for rhinitis.    prn at prn  . Ipratropium-Albuterol (COMBIVENT) 20-100 MCG/ACT AERS respimat Inhale 2 puffs into the lungs every 4 (four) hours while awake. (Patient taking differently: Inhale 1 puff into the lungs 2 (  two) times daily. And as needed.) 4 g 2 03/05/2020 at 0830  . JARDIANCE 25 MG TABS tablet Take 25 mg by mouth daily.   03/05/2020 at 0830  . LORazepam (ATIVAN) 0.5 MG tablet Take 1/2 tablet prior to MRI.  May repeat x 1 prior to MRI if needed. 2 tablet 0 03/05/2020 at prn  . lovastatin (MEVACOR) 10 MG tablet Take one tablet q week. 15 tablet 1 03/02/2020 at 0830  . metFORMIN (GLUCOPHAGE) 500 MG tablet Take 2 tablets (1,000 mg total) by mouth 2 (two) times daily. 360 tablet 1 03/05/2020 at 0830  . metoprolol tartrate (LOPRESSOR) 25 MG tablet Take 1 tablet (25 mg total) by mouth 2 (two) times daily. 180 tablet 1 03/05/2020 at 0830  . RABEprazole (ACIPHEX) 20 MG tablet Take 1 tablet (20 mg total) by mouth in the morning and at bedtime. 180 tablet 1 03/05/2020 at 0830  . vitamin C (VITAMIN C) 1000 MG tablet Take 1 tablet (1,000 mg total) by mouth daily.   Past Week at Unknown time  . ULTRACARE PEN NEEDLES 32G X 4 MM MISC       Assessment: 64 y.o. Caucasian female with a known history of type 2 diabetes mellitus, hypertension, sleep edema, IBS, asthma/COPD and  Crohn's disease, who presented to the emergency room with acute onset of midsternal chest pain, HS troponin 705 >> 901, H&H, platelets stable.  No chronic anticoagulants prior to arrival  Goal of Therapy:  Heparin level 0.3-0.7 units/ml Monitor platelets by anticoagulation protocol: Yes   Plan:   11/28 @ 2211 HL 0.30, therapeutic on low end of range:   increase infusion rate to 1300 units/hr  Re-check anti-Xa level in 6 hours after heparin rate change  Repeat CBC in am  Hart Robinsons A 03/06/2020,10:58 PM

## 2020-03-06 NOTE — Progress Notes (Signed)
*  PRELIMINARY RESULTS* Echocardiogram 2D Echocardiogram has been performed.  Amanda Wiley 03/06/2020, 12:10 PM

## 2020-03-06 NOTE — ED Notes (Signed)
Pt reports intermittent chest pain that radiates to jaw and right arm x 2 days. Pt states pain has improved at this time. Pt placed on monitor and repeat EKG obtained. VSS. Awaiting further orders. Will continue to monitor.

## 2020-03-06 NOTE — Progress Notes (Signed)
PROGRESS NOTE   Amanda Wiley  VCB:[id number] DOB: 13-Jan-1956 DOA: 03/05/2020 PCP: Einar Pheasant, MD  Brief Narrative:  72 white female status post left breast duct excision 09/04/2019 Recent colonoscopy 11/3 with sigmoid and descending colon diverticulosis DM TY 2 followed by endocrinology Dr. Sharrie Rothman HLD, reflux, BMI 107 Crohn's disease asthma polycythemia vera, depression anxiety obstructive sleep apnea not on CPAP and prior cardiac cath 15 years ago  Presented to [facility] ED for central chest pain right arm and shoulder radiation intermittent-initial troponin VII 105-CXR retrocardiac opacity Received aspirin prior to arrival In emergency room given aspirin Nitropaste heparin GTT EKG showed T wave inversion V1 through 3 although this is not new Admitted for NSTEMI-secondary to abnormal CXR CT chest was ordered cardiology was consulted Dr. Ubaldo Glassing  Assessment & Plan:   Active Problems:   NSTEMI (non-ST elevated myocardial infarction) ([place])   1. NSTEMI with prior history of cardiac cath about 2006 a. Cardiology electing on catheterization 11/29 b. Continue ASA 81, metoprolol 25 twice daily, Nitropaste 1 inch and defer rest to cardiology 2. Sepsis not ruled out lactic acidosis 3.0 on admission 3. Abnormal CXR in the setting of Left breast duct excision 09/04/2018 a. CXR abnormal appearing therefore CT scan chest is pending b. At this time antibiotics have been held given uncertainty of diagnosis but lactic acidosis has trended down c. Will work-up dependent on CT scan--- I have briefly updated the patient regarding the same but can talk more once we get back to CT 4. DM TY 2 a. Continue dulaglutide 1.5 weekly, Jardiance 25 and Lantus 20 in addition to sensitive sliding scale b. Blood sugar ranging from 1 46-2 11 monitor trends and adjust as needed c. Metformin to be resumed post cath but has been held for now 5. Bipolar a. Continue Xanax 0.25 twice daily as needed anxiety, Wellbutrin 300  XL, Ambien 5 6. Asthma  a. Continue Advair twice daily, Combivent every 4 as needed may need to add back albuterol 7. Sigmoid diverticulosis with history of Crohn's a. Holding at this time Levsin  Chronic issues stable at present 8. HLD 9. Reflux 10. Polycythemia vera 11. BMI 36  DVT prophylaxis: On IV heparin Code Status: Full CODE STATUS Family Communication:  Disposition:  Status is: Inpatient  Remains inpatient appropriate because:Hemodynamically unstable, Ongoing diagnostic testing needed not appropriate for outpatient work up and Unsafe d/c plan   Dispo: The patient is from: Home              Anticipated d/c is to: Home              Anticipated d/c date is: 2 days              Patient currently is not medically stable to d/c.       Consultants:   Cardiologist  Procedures: Echocardiogram is pending  Antimicrobials: None at this time   Subjective: Feels well overall states still some mild Pain in her right arm and chest but nowhere near what it was yesterday No nausea no vomiting no chills no [place] No cough or cold or any sputum or fever  Objective: Vitals:   03/06/20 0100 03/06/20 0130 03/06/20 0228 03/06/20 0441  BP: (!) 152/74 139/68 (!) 159/82 (!) 119/59  Pulse: 79 88 77 77  Resp: 15 17    Temp:   (!) 97.5 F (36.4 C) (!) 97.5 F (36.4 C)  TempSrc:   Oral Oral  SpO2: 94% 94% 96% 95%  Weight:  99.9 kg   Height:   5' 5"  (1.651 m)     Intake/Output Summary (Last 24 hours) at 03/06/2020 0753 Last data filed at 03/06/2020 0442 Gross per 24 hour  Intake 28.47 ml  Output 0 ml  Net 28.47 ml   Filed Weights   03/05/20 2236 03/06/20 0228  Weight: 99.8 kg 99.9 kg    Examination: Awake coherent alert no distress EOMI NCAT no focal deficit Thick neck Mallampati 4 S1-S2 no murmur on monitors normal sinus rhythm Abdomen soft no rebound no guarding no distention although obese Trace lower extremity edema Neurologically intact no focal  deficit   Data Reviewed: I have personally reviewed following labs and imaging studies BUNs/creatinine 13/0.9-->11/0.7 Troponin 705-->901 Lactic acid 3.0-->1.5 Hemoglobin 15.6--14.7 -Platelet count 212    Radiology Studies: DG Chest 2 View  Result Date: 03/05/2020 CLINICAL DATA:  Chest pain.  Right arm into jaw. EXAM: CHEST - 2 VIEW COMPARISON:  Chest x-ray 02/07/2019 FINDINGS: The heart size and mediastinal contours unchanged. Biapical pleural/pulmonary scarring. Interval development of a retrocardiac airspace opacity. Similar appearing coarsened interstitial markings. No pulmonary edema. No pleural effusion. No pneumothorax. No acute osseous abnormality.  Right humeral surgical hardware. IMPRESSION: Interval development of a retrocardiac airspace opacity that could represent infection or inflammation. Followup PA and lateral chest X-ray is recommended in 3-4 weeks following trial therapy to ensure resolution and exclude underlying malignancy. Electronically Signed   By: Iven Finn M.D.   On: 03/05/2020 22:59     Scheduled Meds:  ascorbic acid  1,000 mg Oral Daily   aspirin  324 mg Oral NOW   Or   aspirin  300 mg Rectal NOW   [START ON 03/07/2020] aspirin EC  81 mg Oral Daily   atorvastatin  80 mg Oral q1800   buPROPion  300 mg Oral Daily   Dulaglutide  1.5 mg Subcutaneous Weekly   empagliflozin  25 mg Oral Daily   fluticasone  1 spray Each Nare Daily   insulin aspart  0-9 Units Subcutaneous TID WC   insulin glargine  20 Units Subcutaneous Daily   metoprolol tartrate  25 mg Oral BID   montelukast  10 mg Oral QHS   pantoprazole  40 mg Oral Daily   Continuous Infusions:  heparin 1,100 Units/hr (03/06/20 0337)     LOS: 0 days    Time spent: [place], MD [facility] To contact the attending provider between 7A-7P or the covering provider during after hours 7P-7A, please log into the web site [email or web address] and access using  universal [place] password for that web site. If you do not have the password, please call the hospital operator.  03/06/2020, 7:53 AM

## 2020-03-06 NOTE — Progress Notes (Signed)
PHARMACY - PHYSICIAN COMMUNICATION CRITICAL VALUE ALERT - BLOOD CULTURE IDENTIFICATION (BCID)  Amanda Wiley is an 64 y.o. female who presented to [facility] on 03/05/2020 with a chief complaint of chest pain.  Assessment:  1/4 bottles GPC. BCID detected Staphylococcus species. No resistance detected. Suspect contamination.   Name of physician (or Provider) Contacted: Dr. Christia Reading Opyd  Current antibiotics: None  Changes to prescribed antibiotics recommended:  No recommendations for starting antibiotics at this time  Results for orders placed or performed during the hospital encounter of 03/05/20  Blood Culture ID Panel (Reflexed) (Collected: 03/06/2020 12:35 AM)  Result Value Ref Range   Enterococcus faecalis NOT DETECTED NOT DETECTED   Enterococcus Faecium NOT DETECTED NOT DETECTED   Listeria monocytogenes NOT DETECTED NOT DETECTED   Staphylococcus species DETECTED (A) NOT DETECTED   Staphylococcus aureus (BCID) NOT DETECTED NOT DETECTED   Staphylococcus epidermidis NOT DETECTED NOT DETECTED   Staphylococcus lugdunensis NOT DETECTED NOT DETECTED   Streptococcus species NOT DETECTED NOT DETECTED   Streptococcus agalactiae NOT DETECTED NOT DETECTED   Streptococcus pneumoniae NOT DETECTED NOT DETECTED   Streptococcus pyogenes NOT DETECTED NOT DETECTED   A.calcoaceticus-baumannii NOT DETECTED NOT DETECTED   Bacteroides fragilis NOT DETECTED NOT DETECTED   Enterobacterales NOT DETECTED NOT DETECTED   Enterobacter cloacae complex NOT DETECTED NOT DETECTED   Escherichia coli NOT DETECTED NOT DETECTED   Klebsiella aerogenes NOT DETECTED NOT DETECTED   Klebsiella oxytoca NOT DETECTED NOT DETECTED   Klebsiella pneumoniae NOT DETECTED NOT DETECTED   Proteus species NOT DETECTED NOT DETECTED   Salmonella species NOT DETECTED NOT DETECTED   Serratia marcescens NOT DETECTED NOT DETECTED   Haemophilus influenzae NOT DETECTED NOT DETECTED   Neisseria meningitidis NOT DETECTED NOT DETECTED    Pseudomonas aeruginosa NOT DETECTED NOT DETECTED   Stenotrophomonas maltophilia NOT DETECTED NOT DETECTED   Candida albicans NOT DETECTED NOT DETECTED   Candida auris NOT DETECTED NOT DETECTED   Candida glabrata NOT DETECTED NOT DETECTED   Candida krusei NOT DETECTED NOT DETECTED   Candida parapsilosis NOT DETECTED NOT DETECTED   Candida tropicalis NOT DETECTED NOT DETECTED   Cryptococcus neoformans/gattii NOT DETECTED NOT DETECTED    Benita Gutter 03/06/2020  6:50 PM

## 2020-03-06 NOTE — Progress Notes (Signed)
ANTICOAGULATION CONSULT NOTE - Initial Consult  Pharmacy Consult for Heparin Indication: chest pain/ACS  Allergies  Allergen Reactions  . Avelox [Moxifloxacin Hcl In Nacl] Hives, Shortness Of Breath and Other (See Comments)  . Moxifloxacin Hives and Shortness Of Breath  . Allegra [Fexofenadine Hcl] Other (See Comments)    headaches  . Ibuprofen Swelling    FACIAL  . Sulfa Antibiotics Other (See Comments)    Difficulty breathing, rash, tongue swelling  . Tegretol [Carbamazepine] Other (See Comments)    Other reaction(s): Other (See Comments) Vomiting Vomiting   . Iodine Rash  . Ioxaglate Rash  . Ivp Dye [Iodinated Diagnostic Agents] Rash    Patient Measurements: Height: 5' 6"  (167.6 cm) Weight: 99.8 kg (220 lb 0.3 oz) IBW/kg (Calculated) : 59.3 HEPARIN DW (KG): 81.8  Vital Signs: Temp: 98.6 F (37 C) (11/27 2236) Temp Source: Oral (11/27 2236) BP: 168/82 (11/27 2236) Pulse Rate: 93 (11/27 2236)  Labs: Recent Labs    03/05/20 2237  HGB 15.6*  HCT 47.6*  PLT 216  CREATININE 0.96  TROPONINIHS 705*   Estimated Creatinine Clearance: 69.6 mL/min (by C-G formula based on SCr of 0.96 mg/dL).  Medical History: Past Medical History:  Diagnosis Date  . Arthritis   . Asthma   . Complication of anesthesia    HARD TO WAKE UP  . COPD (chronic obstructive pulmonary disease) ([place])   . Crohn's disease ([place])   . Depression   . Diabetes mellitus ([place])   . Fracture, foot    post fall  . Hypercholesterolemia   . Hypertension   . IBS (irritable bowel syndrome)   . Nephrolithiasis    s/p lithotripsy [facility])  . Vitamin D deficiency    Medications:  (Not in a hospital admission)   Assessment: Heparin for ACS.  Baseline labs ordered.  No anticoagulants PTA.  Goal of Therapy:  Heparin level 0.3-0.7 units/ml Monitor platelets by anticoagulation protocol: Yes   Plan:  Heparin 4000 units bolus x 1 then infusion at 1100 units/hr Check HL ~ 6 hours  after heparin started  Hart Robinsons A 03/06/2020,12:20 AM

## 2020-03-06 NOTE — ED Notes (Signed)
Date and time results received: 03/06/20 0110 (use smartphrase ".now" to insert current time)  Test: Lactic Acid Critical Value: 3.0  Name of Provider Notified: Sidney Ace, MD

## 2020-03-06 NOTE — Progress Notes (Signed)
ANTICOAGULATION CONSULT NOTE  Pharmacy Consult for Heparin Indication: chest pain/ACS  Patient Measurements: Height: 5' 5"  (165.1 cm) Weight: 99.9 kg (220 lb 3.2 oz) IBW/kg (Calculated) : 57 HEPARIN DW (KG): 79.8  Vital Signs: Temp: 97.3 F (36.3 C) (11/28 0808) Temp Source: Oral (11/28 0808) BP: 104/57 (11/28 0808) Pulse Rate: 63 (11/28 0808)  Labs: Recent Labs    03/05/20 2237 03/06/20 0034 03/06/20 0036 03/06/20 0435  HGB 15.6*  --   --  14.7  HCT 47.6*  --   --  44.1  PLT 216  --   --  212  APTT  --   --  29  --   LABPROT  --  11.9 12.0  --   INR  --  0.9 0.9  --   HEPARINUNFRC  --   --   --  0.41  CREATININE 0.96  --   --  0.79  TROPONINIHS 705* 901*  --   --    Estimated Creatinine Clearance: 82.1 mL/min (by C-G formula based on SCr of 0.79 mg/dL).  Medical History: Past Medical History:  Diagnosis Date  . Arthritis   . Asthma   . Complication of anesthesia    HARD TO WAKE UP  . COPD (chronic obstructive pulmonary disease) ([place])   . Crohn's disease ([place])   . Depression   . Diabetes mellitus ([place])   . Fracture, foot    post fall  . Hypercholesterolemia   . Hypertension   . IBS (irritable bowel syndrome)   . Nephrolithiasis    s/p lithotripsy [facility])  . Vitamin D deficiency    Medications:  Medications Prior to Admission  Medication Sig Dispense Refill Last Dose  . acetaminophen (TYLENOL) 325 MG tablet Take 2 tablets (650 mg total) by mouth every 6 (six) hours as needed for mild pain (or Fever >/= 101).   prn at prn  . albuterol (PROVENTIL) (2.5 MG/3ML) 0.083% nebulizer solution Take 2.5 mg by nebulization every 6 (six) hours as needed for wheezing.   prn at prn  . aspirin EC 81 MG tablet Take 81 mg by mouth once. Swallow whole.   03/05/2020 at 2030  . buPROPion (WELLBUTRIN XL) 300 MG 24 hr tablet Take 1 tablet (300 mg total) by mouth daily. 90 tablet 1 03/05/2020 at 0830  . Dulaglutide (TRULICITY) 1.5 EY/8.1KG SOPN Inject 1.5 mg  into the skin once a week. ON SUNDAYS   02/28/2020 at 0830  . fluticasone-salmeterol (ADVAIR HFA) 230-21 MCG/ACT inhaler Inhale 2 puffs into the lungs 2 (two) times daily.   03/05/2020 at 0830  . hyoscyamine (LEVSIN SL) 0.125 MG SL tablet Place 0.125 mg under the tongue as needed.   prn at prn  . insulin glargine (LANTUS) 100 UNIT/ML injection Inject 20 Units into the skin at bedtime.    03/04/2020 at 2300  . ipratropium (ATROVENT) 0.06 % nasal spray Place 2 sprays into both nostrils 3 (three) times daily as needed for rhinitis.    prn at prn  . Ipratropium-Albuterol (COMBIVENT) 20-100 MCG/ACT AERS respimat Inhale 2 puffs into the lungs every 4 (four) hours while awake. (Patient taking differently: Inhale 1 puff into the lungs 2 (two) times daily. And as needed.) 4 g 2 03/05/2020 at 0830  . JARDIANCE 25 MG TABS tablet Take 25 mg by mouth daily.   03/05/2020 at 0830  . LORazepam (ATIVAN) 0.5 MG tablet Take 1/2 tablet prior to MRI.  May repeat x 1 prior to MRI if needed. 2  tablet 0 03/05/2020 at prn  . lovastatin (MEVACOR) 10 MG tablet Take one tablet q week. 15 tablet 1 03/02/2020 at 0830  . metFORMIN (GLUCOPHAGE) 500 MG tablet Take 2 tablets (1,000 mg total) by mouth 2 (two) times daily. 360 tablet 1 03/05/2020 at 0830  . metoprolol tartrate (LOPRESSOR) 25 MG tablet Take 1 tablet (25 mg total) by mouth 2 (two) times daily. 180 tablet 1 03/05/2020 at 0830  . RABEprazole (ACIPHEX) 20 MG tablet Take 1 tablet (20 mg total) by mouth in the morning and at bedtime. 180 tablet 1 03/05/2020 at 0830  . vitamin C (VITAMIN C) 1000 MG tablet Take 1 tablet (1,000 mg total) by mouth daily.   Past Week at Unknown time  . ULTRACARE PEN NEEDLES 32G X 4 MM MISC        Assessment: 64 y.o. Caucasian female with a known history of type 2 diabetes mellitus, hypertension, sleep edema, IBS, asthma/COPD and Crohn's disease, who presented to the emergency room with acute onset of midsternal chest pain, HS troponin 705 >> 901,  H&H, platelets stable.  No chronic anticoagulants prior to arrival  Goal of Therapy:  Heparin level 0.3-0.7 units/ml Monitor platelets by anticoagulation protocol: Yes   Plan:   1st anti-Xa level therapeutic:   Continue infusion at 1100 units/hr  Re-check confirmatory anti-Xa level in 6 hours after heparin started  If 2nd anti-Xa level is therapeutic we can check once daily thereafter per protocol  Repeat CBC in am  Dallie Piles 03/06/2020,9:06 AM

## 2020-03-07 ENCOUNTER — Other Ambulatory Visit: Payer: Self-pay

## 2020-03-07 ENCOUNTER — Encounter: Payer: Self-pay | Admitting: Cardiology

## 2020-03-07 ENCOUNTER — Encounter
Admission: EM | Disposition: A | Discharge status: Home / Self Care | Payer: Self-pay | Source: Home / Self Care | Attending: Family Medicine

## 2020-03-07 DIAGNOSIS — I214 Non-ST elevation (NSTEMI) myocardial infarction: Secondary | ICD-10-CM | POA: Diagnosis not present

## 2020-03-07 HISTORY — PX: LEFT HEART CATH AND CORONARY ANGIOGRAPHY: CATH118249

## 2020-03-07 HISTORY — PX: CORONARY STENT INTERVENTION: CATH118234

## 2020-03-07 LAB — POCT ACTIVATED CLOTTING TIME
Activated Clotting Time: 296 seconds
Activated Clotting Time: 329 seconds

## 2020-03-07 LAB — CULTURE, BLOOD (ROUTINE X 2): Special Requests: ADEQUATE

## 2020-03-07 LAB — COMPREHENSIVE METABOLIC PANEL
ALT: 23 U/L (ref 0–44)
AST: 26 U/L (ref 15–41)
Albumin: 3.2 g/dL — ABNORMAL LOW (ref 3.5–5.0)
Alkaline Phosphatase: 51 U/L (ref 38–126)
Anion gap: 10 (ref 5–15)
BUN: 15 mg/dL (ref 8–23)
CO2: 22 mmol/L (ref 22–32)
Calcium: 8.5 mg/dL — ABNORMAL LOW (ref 8.9–10.3)
Chloride: 109 mmol/L (ref 98–111)
Creatinine, Ser: 0.83 mg/dL (ref 0.44–1.00)
GFR, Estimated: >60 mL/min (ref >60)
Glucose, Bld: 165 mg/dL — ABNORMAL HIGH (ref 70–99)
Potassium: 3.3 mmol/L — ABNORMAL LOW (ref 3.5–5.1)
Sodium: 141 mmol/L (ref 135–145)
Total Bilirubin: 0.8 mg/dL (ref 0.3–1.2)
Total Protein: 6.2 g/dL — ABNORMAL LOW (ref 6.5–8.1)

## 2020-03-07 LAB — CBC
HCT: 42.7 % (ref 36.0–46.0)
Hemoglobin: 13.9 g/dL (ref 12.0–15.0)
MCH: 28.1 pg (ref 26.0–34.0)
MCHC: 32.6 g/dL (ref 30.0–36.0)
MCV: 86.4 fL (ref 80.0–100.0)
Platelets: 184 10*3/uL (ref 150–400)
RBC: 4.94 MIL/uL (ref 3.87–5.11)
RDW: 15.8 % — ABNORMAL HIGH (ref 11.5–15.5)
WBC: 9.4 10*3/uL (ref 4.0–10.5)
nRBC: 0 % (ref 0.0–0.2)

## 2020-03-07 LAB — GLUCOSE, CAPILLARY
Glucose-Capillary: 202 mg/dL — ABNORMAL HIGH (ref 70–99)
Glucose-Capillary: 272 mg/dL — ABNORMAL HIGH (ref 70–99)
Glucose-Capillary: 272 mg/dL — ABNORMAL HIGH (ref 70–99)
Glucose-Capillary: 316 mg/dL — ABNORMAL HIGH (ref 70–99)

## 2020-03-07 LAB — HEPARIN LEVEL (UNFRACTIONATED): Heparin Unfractionated: 0.46 IU/mL (ref 0.30–0.70)

## 2020-03-07 LAB — ECHOCARDIOGRAM COMPLETE
AV Peak grad: 7.8 mmHg
Ao pk vel: 1.4 m/s
Area-P 1/2: 3.08 cm2
Height: 65 in
S' Lateral: 3.26 cm
Weight: 3523.2 oz

## 2020-03-07 SURGERY — LEFT HEART CATH AND CORONARY ANGIOGRAPHY
Anesthesia: Moderate Sedation

## 2020-03-07 MED ORDER — HEPARIN (PORCINE) IN NACL 1000-0.9 UT/500ML-% IV SOLN
INTRAVENOUS | Status: AC
  Filled 2020-03-07: qty 1000

## 2020-03-07 MED ORDER — ONDANSETRON HCL 4 MG/2ML IJ SOLN
INTRAMUSCULAR | Status: DC | PRN
  Administered 2020-03-07: 4 mg via INTRAVENOUS

## 2020-03-07 MED ORDER — HEPARIN SODIUM (PORCINE) 1000 UNIT/ML IJ SOLN
INTRAMUSCULAR | Status: AC
  Filled 2020-03-07: qty 1

## 2020-03-07 MED ORDER — ONDANSETRON HCL 4 MG/2ML IJ SOLN: 4.0000 mg | Freq: Four times a day (QID) | INTRAMUSCULAR | Status: DC | PRN

## 2020-03-07 MED ORDER — SODIUM CHLORIDE 0.9% FLUSH
3.0000 mL | Freq: Two times a day (BID) | INTRAVENOUS | Status: DC
  Administered 2020-03-07 – 2020-03-08 (×2): 3 mL via INTRAVENOUS

## 2020-03-07 MED ORDER — SODIUM CHLORIDE 0.9 % IV SOLN: 250.0000 mL | INTRAVENOUS | Status: DC | PRN

## 2020-03-07 MED ORDER — HYDRALAZINE HCL 20 MG/ML IJ SOLN: 10.0000 mg | INTRAMUSCULAR | Status: AC | PRN

## 2020-03-07 MED ORDER — ONDANSETRON HCL 4 MG/2ML IJ SOLN
INTRAMUSCULAR | Status: AC
  Filled 2020-03-07: qty 2

## 2020-03-07 MED ORDER — FENTANYL CITRATE (PF) 100 MCG/2ML IJ SOLN
INTRAMUSCULAR | Status: DC | PRN
  Administered 2020-03-07: 25 ug via INTRAVENOUS

## 2020-03-07 MED ORDER — ASPIRIN 81 MG PO CHEW
CHEWABLE_TABLET | ORAL | Status: DC | PRN
  Administered 2020-03-07: 243 mg via ORAL

## 2020-03-07 MED ORDER — ASPIRIN 81 MG PO CHEW
81.0000 mg | CHEWABLE_TABLET | Freq: Every day | ORAL | Status: DC
  Administered 2020-03-08: 81 mg via ORAL
  Filled 2020-03-07: qty 1

## 2020-03-07 MED ORDER — NITROGLYCERIN 1 MG/10 ML FOR IR/CATH LAB
INTRA_ARTERIAL | Status: DC | PRN
  Administered 2020-03-07 (×2): 200 ug via INTRACORONARY

## 2020-03-07 MED ORDER — METHYLPREDNISOLONE SODIUM SUCC 125 MG IJ SOLR
INTRAMUSCULAR | Status: AC
  Administered 2020-03-07: 125 mg via INTRAVENOUS
  Filled 2020-03-07: qty 2

## 2020-03-07 MED ORDER — MIDAZOLAM HCL 2 MG/2ML IJ SOLN
INTRAMUSCULAR | Status: DC | PRN
  Administered 2020-03-07: 1 mg via INTRAVENOUS

## 2020-03-07 MED ORDER — POTASSIUM CHLORIDE CRYS ER 20 MEQ PO TBCR
40.0000 meq | EXTENDED_RELEASE_TABLET | Freq: Every day | ORAL | Status: DC
  Administered 2020-03-07 – 2020-03-08 (×2): 40 meq via ORAL
  Filled 2020-03-07 (×2): qty 2

## 2020-03-07 MED ORDER — HEPARIN (PORCINE) IN NACL 1000-0.9 UT/500ML-% IV SOLN
INTRAVENOUS | Status: DC | PRN
  Administered 2020-03-07: 500 mL

## 2020-03-07 MED ORDER — SODIUM CHLORIDE 0.9% FLUSH: 3.0000 mL | INTRAVENOUS | Status: DC | PRN

## 2020-03-07 MED ORDER — LABETALOL HCL 5 MG/ML IV SOLN: 10.0000 mg | INTRAVENOUS | Status: AC | PRN

## 2020-03-07 MED ORDER — PRASUGREL HCL 10 MG PO TABS
ORAL_TABLET | ORAL | Status: AC
  Filled 2020-03-07: qty 6

## 2020-03-07 MED ORDER — PRASUGREL HCL 10 MG PO TABS
10.0000 mg | ORAL_TABLET | Freq: Every day | ORAL | Status: DC
  Administered 2020-03-08: 10 mg via ORAL
  Filled 2020-03-07: qty 1

## 2020-03-07 MED ORDER — HEPARIN SODIUM (PORCINE) 1000 UNIT/ML IJ SOLN
INTRAMUSCULAR | Status: DC | PRN
  Administered 2020-03-07: 5000 [IU] via INTRAVENOUS
  Administered 2020-03-07: 7000 [IU] via INTRAVENOUS
  Administered 2020-03-07: 2000 [IU] via INTRAVENOUS

## 2020-03-07 MED ORDER — DIPHENHYDRAMINE HCL 50 MG/ML IJ SOLN: 50.0000 mg | Freq: Once | INTRAMUSCULAR | Status: AC

## 2020-03-07 MED ORDER — IOHEXOL 300 MG/ML  SOLN
INTRAMUSCULAR | Status: DC | PRN
  Administered 2020-03-07: 235 mL

## 2020-03-07 MED ORDER — MIDAZOLAM HCL 2 MG/2ML IJ SOLN
INTRAMUSCULAR | Status: AC
  Filled 2020-03-07: qty 2

## 2020-03-07 MED ORDER — PRASUGREL HCL 10 MG PO TABS
ORAL_TABLET | ORAL | Status: DC | PRN
  Administered 2020-03-07: 60 mg via ORAL

## 2020-03-07 MED ORDER — FENTANYL CITRATE (PF) 100 MCG/2ML IJ SOLN
INTRAMUSCULAR | Status: AC
  Filled 2020-03-07: qty 2

## 2020-03-07 MED ORDER — METHYLPREDNISOLONE SODIUM SUCC 125 MG IJ SOLR: 125.0000 mg | Freq: Once | INTRAMUSCULAR | Status: AC

## 2020-03-07 MED ORDER — LIDOCAINE HCL (PF) 1 % IJ SOLN
INTRAMUSCULAR | Status: AC
  Filled 2020-03-07: qty 30

## 2020-03-07 MED ORDER — VERAPAMIL HCL 2.5 MG/ML IV SOLN
INTRAVENOUS | Status: AC
  Filled 2020-03-07: qty 2

## 2020-03-07 MED ORDER — DIPHENHYDRAMINE HCL 50 MG/ML IJ SOLN
INTRAMUSCULAR | Status: AC
  Administered 2020-03-07: 50 mg via INTRAVENOUS
  Filled 2020-03-07: qty 1

## 2020-03-07 MED ORDER — SODIUM CHLORIDE 0.9 % WEIGHT BASED INFUSION: 1.0000 mL/kg/h | INTRAVENOUS | Status: AC

## 2020-03-07 MED ORDER — ASPIRIN EC 81 MG PO TBEC: 81.0000 mg | DELAYED_RELEASE_TABLET | Freq: Every day | ORAL | Status: DC

## 2020-03-07 MED ORDER — VERAPAMIL HCL 2.5 MG/ML IV SOLN
INTRAVENOUS | Status: DC | PRN
  Administered 2020-03-07: 2.5 mg via INTRAVENOUS

## 2020-03-07 MED ORDER — ASPIRIN 81 MG PO CHEW
CHEWABLE_TABLET | ORAL | Status: AC
  Filled 2020-03-07: qty 3

## 2020-03-07 MED ORDER — ACETAMINOPHEN 325 MG PO TABS: 650.0000 mg | ORAL_TABLET | ORAL | Status: DC | PRN

## 2020-03-07 SURGICAL SUPPLY — 17 items
BALLN TREK RX 2.25X12 (BALLOONS) ×3
BALLN ~~LOC~~ EUPHORA RX 2.5X20 (BALLOONS) ×3
BALLOON TREK RX 2.25X12 (BALLOONS) IMPLANT
BALLOON ~~LOC~~ EUPHORA RX 2.5X20 (BALLOONS) IMPLANT
CATH 5F 110X4 TIG (CATHETERS) ×2 IMPLANT
CATH VISTA GUIDE 6FR JL3.5 (CATHETERS) ×2 IMPLANT
CATH VISTA GUIDE 6FR XB3.5 (CATHETERS) ×2 IMPLANT
DEVICE RAD TR BAND REGULAR (VASCULAR PRODUCTS) ×2 IMPLANT
GLIDESHEATH SLEND SS 6F .021 (SHEATH) ×2 IMPLANT
GUIDEWIRE INQWIRE 1.5J.035X260 (WIRE) IMPLANT
INQWIRE 1.5J .035X260CM (WIRE) ×3
KIT ENCORE 26 ADVANTAGE (KITS) ×2 IMPLANT
KIT MANI 3VAL PERCEP (MISCELLANEOUS) ×3 IMPLANT
PACK CARDIAC CATH (CUSTOM PROCEDURE TRAY) ×3 IMPLANT
STENT RESOLUTE ONYX 2.25X8 (Permanent Stent) ×2 IMPLANT
STENT RESOLUTE ONYX 2.5X22 (Permanent Stent) ×2 IMPLANT
WIRE ASAHI PROWATER 180CM (WIRE) ×2 IMPLANT

## 2020-03-07 NOTE — Hospital Course (Addendum)
Conclusion

## 2020-03-07 NOTE — Progress Notes (Signed)
PROGRESS NOTE   Amanda Wiley  HBZ:[id number] DOB: May 17, 1955 DOA: 03/05/2020 PCP: Einar Pheasant, MD  Brief Narrative:  59 white female status post left breast duct excision 09/04/2019 Recent colonoscopy 11/3 with sigmoid and descending colon diverticulosis DM TY 2 followed by endocrinology Dr. Sharrie Rothman HLD, reflux, BMI 17 Crohn's disease asthma polycythemia vera, depression anxiety obstructive sleep apnea not on CPAP and prior cardiac cath 15 years ago  Presented to [facility] ED for central chest pain right arm and shoulder radiation intermittent-initial troponin VII 105-CXR retrocardiac opacity Received aspirin prior to arrival In emergency room given aspirin Nitropaste heparin GTT EKG showed T wave inversion V1 through 3 although this is not new Admitted for NSTEMI-secondary to abnormal CXR CT chest was ordered cardiology was consulted Dr. Ubaldo Glassing  Assessment & Plan:   Active Problems:   NSTEMI (non-ST elevated myocardial infarction) ([place])   1. NSTEMI with prior history of cardiac cath about 2006 a. Cardiology catheterization with PCI to LAD on 11/29-needs aspirin Plavix at least 1 year uninterrupted b. Continuing metoprolol 25 twice daily,  Nitropaste discontinued-rest as per cardiology 2. Sepsis not ruled out lactic acidosis 3.0 on admission 3. Abnormal CXR in the setting of Left breast duct excision 09/04/2018 a. CXR abnormal appearing-CT chest shows chronic lung changes without malignancy b. Patient is aware of chronic findings in the lungs 4. DM TY 2 a. Continue dulaglutide 1.5 weekly, Jardiance 25 and Lantus 20 in addition to sensitive sliding scale--Metformin held given cardiac cath and resume 11/30 on discharge b. CBG ranging 1 65-2 72 no changes to regimen today 5. Bipolar a. Continue Xanax 0.25 twice daily as needed anxiety, Wellbutrin 300 XL, Ambien 5 6. Asthma  a. Continue Advair twice daily, Combivent every 4 as needed may need to add back albuterol 7. Sigmoid  diverticulosis with history of Crohn's a. Holding at this time Levsin  Chronic issues stable at present 8. HLD 9. Reflux 10. Polycythemia vera 11. BMI 36  DVT prophylaxis: On IV heparin Code Status: Full CODE STATUS Family Communication: Discussed with husband at bedside Disposition:  Status is: Inpatient  Remains inpatient appropriate because:Hemodynamically unstable, Ongoing diagnostic testing needed not appropriate for outpatient work up and Unsafe d/c plan   Dispo: The patient is from: Home              Anticipated d/c is to: Home              Anticipated d/c date is: 2 days              Patient currently is not medically stable to d/c.       Consultants:   Cardiologist  Procedures:  Echocardiogram 11/28 EF 55-60% no wall motion abnormalities-valves seem normal per echo  Cardiac cath 11/29  Mid LAD lesion is 90% stenosed.  Prox RCA lesion is 20% stenosed.  Dist LAD lesion is 40% stenosed.  A drug-eluting stent was successfully placed.  Post intervention, there is a 0% residual stenosis.  A drug-eluting stent was successfully placed.   1.  One-vessel coronary artery disease with 90% stenosis mid LAD 2.  Normal left ventricular function 3.  Successful PCI, with overlapping DES mid LAD   Recommendations   1.  Dual antiplatelet therapy uninterrupted for 1 year 2.  Aggressive risk factor modification 3.  Likely discharge home in a.m.  Antimicrobials: None at this time   Subjective:  Doing fair expected pain in right arm but no other issues other than may be indigestion  Does not have chest pain   Objective: Vitals:   03/07/20 1200 03/07/20 1215 03/07/20 1245 03/07/20 1309  BP: (!) 145/62 (!) 151/68  136/70  Pulse: 73 78 82 81  Resp: (!) 21 (!) 24 (!) 24 18  Temp:    97.9 F (36.6 C)  TempSrc:    Oral  SpO2: 95% 95% 92% 93%  Weight:      Height:        Intake/Output Summary (Last 24 hours) at 03/07/2020 1329 Last data filed at 03/07/2020  1240 Gross per 24 hour  Intake 1659.8 ml  Output 2700 ml  Net -1040.2 ml   Filed Weights   03/06/20 0228 03/07/20 0438 03/07/20 0739  Weight: 99.9 kg 101.2 kg 101.2 kg    Examination: Awake coherent pleasant no distress seems to be comfortable eating Clear no rales no rhonchi to chest exam S1-S2 no murmur Abdomen soft no rebound no guarding Lower extremities soft nontender  Data Reviewed: I have personally reviewed following labs and imaging studies BUNs/creatinine 13/0.9-->11/0.7-->15/0.83 Hemoglobin 15.6--14.7-->13.9    Radiology Studies: DG Chest 2 View  Result Date: 03/05/2020 CLINICAL DATA:  Chest pain.  Right arm into jaw. EXAM: CHEST - 2 VIEW COMPARISON:  Chest x-ray 02/07/2019 FINDINGS: The heart size and mediastinal contours unchanged. Biapical pleural/pulmonary scarring. Interval development of a retrocardiac airspace opacity. Similar appearing coarsened interstitial markings. No pulmonary edema. No pleural effusion. No pneumothorax. No acute osseous abnormality.  Right humeral surgical hardware. IMPRESSION: Interval development of a retrocardiac airspace opacity that could represent infection or inflammation. Followup PA and lateral chest X-ray is recommended in 3-4 weeks following trial therapy to ensure resolution and exclude underlying malignancy. Electronically Signed   By: Iven Finn M.D.   On: 03/05/2020 22:59   CT CHEST W CONTRAST  Result Date: 03/06/2020 CLINICAL DATA:  Follow-up abnormal chest radiograph. Rule out metastatic breast cancer. EXAM: CT CHEST WITH CONTRAST TECHNIQUE: Multidetector CT imaging of the chest was performed during intravenous contrast administration. CONTRAST:  58m OMNIPAQUE IOHEXOL 300 MG/ML  SOLN COMPARISON:  CT angio chest 02/02/19 FINDINGS: Cardiovascular: The heart size appears within normal limits. No pericardial effusion. Aortic atherosclerosis. Coronary artery calcifications. Mediastinum/Nodes: Normal appearance of the thyroid  gland. The trachea appears patent and is midline. Normal appearance of the esophagus. No enlarged axillary, supraclavicular, mediastinal, or hilar lymph nodes. Lungs/Pleura: There are coarsened interstitial markings identified bilaterally, likely the sequelae of prior inflammation/infection. Mosaic attenuation is noted within the upper lung zones with areas of air trapping, likely reflecting small airways disease. Scar like density with surrounding architectural distortion is identified within the posterior left lower lobe, image 105/3, which likely accounts for the chest radiograph abnormality. Subpleural reticulation and scarring is also noted within the posterior right lung base. There is a calcified granuloma noted within the posterior right base, image 112/3. No suspicious pulmonary nodules to suggest metastatic disease Upper Abdomen: No acute abnormality identified within the imaged portions of the upper abdomen. Previous cholecystectomy. Scarring is noted involving the left kidney. Musculoskeletal: Postoperative changes from ORIF of the right proximal humerus noted. No acute or suspicious osseous abnormality. Small bone island noted within the upper thoracic spine measuring 5 mm. No signs of chest wall mass identified. IMPRESSION: 1. No findings to suggest metastatic disease to the chest. 2. Bilateral lower lobe areas of scarring and architectural distortion are identified. Findings are favored to represent sequelae of inflammation/infection. 3. Mosaic attenuation pattern identified within the upper lung zones likely reflecting small airways disease.  4. Aortic atherosclerosis. Coronary artery calcifications noted. Aortic Atherosclerosis (ICD10-I70.0). Electronically Signed   By: Kerby Moors M.D.   On: 03/06/2020 13:08   CARDIAC CATHETERIZATION  Result Date: 03/07/2020  Mid LAD lesion is 90% stenosed.  Prox RCA lesion is 20% stenosed.  Dist LAD lesion is 40% stenosed.  A drug-eluting stent was  successfully placed.  Post intervention, there is a 0% residual stenosis.  A drug-eluting stent was successfully placed.  1.  One-vessel coronary artery disease with 90% stenosis mid LAD 2.  Normal left ventricular function 3.  Successful PCI, with overlapping DES mid LAD Recommendations 1.  Dual antiplatelet therapy uninterrupted for 1 year 2.  Aggressive risk factor modification 3.  Likely discharge home in a.m.   ECHOCARDIOGRAM COMPLETE  Result Date: 03/07/2020    ECHOCARDIOGRAM REPORT   Patient Name:   Amanda Wiley Date of Exam: 03/06/2020 Medical Rec #:  [id number]      Height:       65.0 in Accession #:    [id number]     Weight:       220.2 lb Date of Birth:  July 04, 1955     BSA:          2.061 m Patient Age:    64 years       BP:           104/57 mmHg Patient Gender: F              HR:           63 bpm. Exam Location:  [facility] Procedure: 2D Echo, Cardiac Doppler and Color Doppler Indications:     NSTEMI I21.4  History:         Patient has prior history of Echocardiogram examinations. Risk                  Factors:Hypertension and Diabetes.  Sonographer:     Alyse Low Roar Referring Phys:  [id number] [place] Diagnosing Phys: Bartholome Bill MD IMPRESSIONS  1. Left ventricular ejection fraction, by estimation, is 55 to 60%. The left ventricle has normal function. The left ventricle has no regional wall motion abnormalities. Left ventricular diastolic parameters were normal.  2. Right ventricular systolic function is normal. The right ventricular size is normal.  3. Left atrial size was mildly dilated.  4. The mitral valve is grossly normal. Trivial mitral valve regurgitation.  5. The aortic valve is grossly normal. Aortic valve regurgitation is not visualized. FINDINGS  Left Ventricle: Left ventricular ejection fraction, by estimation, is 55 to 60%. The left ventricle has normal function. The left ventricle has no regional wall motion abnormalities. The left ventricular internal cavity size was normal in size.  There is  borderline left ventricular hypertrophy. Left ventricular diastolic parameters were normal. Right Ventricle: The right ventricular size is normal. No increase in right ventricular wall thickness. Right ventricular systolic function is normal. Left Atrium: Left atrial size was mildly dilated. Right Atrium: Right atrial size was normal in size. Pericardium: There is no evidence of pericardial effusion. Mitral Valve: The mitral valve is grossly normal. Trivial mitral valve regurgitation. Tricuspid Valve: The tricuspid valve is grossly normal. Tricuspid valve regurgitation is trivial. Aortic Valve: The aortic valve is grossly normal. Aortic valve regurgitation is not visualized. Aortic valve peak gradient measures 7.8 mmHg. Pulmonic Valve: The pulmonic valve was not well visualized. Pulmonic valve regurgitation is trivial. Aorta: The aortic root is normal in size and structure. IAS/Shunts: The atrial septum is  grossly normal.  LEFT VENTRICLE PLAX 2D LVIDd:         4.71 cm Diastology LVIDs:         3.26 cm LV e' medial:    5.98 cm/s LV PW:         0.93 cm LV E/e' medial:  16.0 LV IVS:        1.12 cm LV e' lateral:   9.36 cm/s                        LV E/e' lateral: 10.2  RIGHT VENTRICLE RV Mid diam:    2.76 cm RV S prime:     11.70 cm/s TAPSE (M-mode): 1.9 cm LEFT ATRIUM             Index       RIGHT ATRIUM           Index LA diam:        3.90 cm 1.89 cm/m  RA Area:     12.80 cm LA Vol (A2C):   62.6 ml 30.38 ml/m RA Volume:   29.90 ml  14.51 ml/m LA Vol (A4C):   47.0 ml 22.81 ml/m LA Biplane Vol: 54.4 ml 26.40 ml/m  AORTIC VALVE              PULMONIC VALVE AV Vmax:      140.00 cm/s PV Vmax:        0.92 m/s AV Peak Grad: 7.8 mmHg    PV Peak grad:   3.4 mmHg LVOT Vmax:    104.00 cm/s RVOT Peak grad: 2 mmHg  AORTA Ao Root diam: 2.70 cm MITRAL VALVE MV Area (PHT): 3.08 cm MV Decel Time: 246 msec MV E velocity: 95.50 cm/s MV A velocity: 77.10 cm/s MV E/A ratio:  1.24 MV A Prime:    8.2 cm/s Bartholome Bill MD  Electronically signed by Bartholome Bill MD Signature Date/Time: 03/07/2020/7:30:49 AM    Final      Scheduled Meds:  ascorbic acid  1,000 mg Oral Daily   aspirin  81 mg Oral Daily   buPROPion  300 mg Oral Daily   empagliflozin  25 mg Oral Daily   fluticasone  1 spray Each Nare Daily   insulin aspart  0-9 Units Subcutaneous TID WC   insulin glargine  20 Units Subcutaneous Daily   metoprolol tartrate  25 mg Oral BID   montelukast  10 mg Oral QHS   pantoprazole  40 mg Oral Daily   potassium chloride  40 mEq Oral Daily   [START ON 03/08/2020] prasugrel  10 mg Oral Daily   sodium chloride flush  3 mL Intravenous Q12H   sodium chloride flush  3 mL Intravenous Q12H   Continuous Infusions:  sodium chloride     sodium chloride     heparin Stopped (03/07/20 0730)     LOS: 1 day    Time spent: 25  Nita Sells, MD [facility] To contact the attending provider between 7A-7P or the covering provider during after hours 7P-7A, please log into the web site [email or web address] and access using universal [place] password for that web site. If you do not have the password, please call the hospital operator.  03/07/2020, 1:29 PM

## 2020-03-07 NOTE — Progress Notes (Signed)
Pt alert and oriented x 4. Pt states she is not in any pain. Pt right forearm IV stung when I pushed a saline flush. Pt right forearm IV was infiltrated, IV was removed. Pt states she has not had a bowel movement since 11/27. Pt went NPO at midnight per protocol for cardiac cath procedure scheduled for 11/29. Will continue pt care.

## 2020-03-07 NOTE — Progress Notes (Addendum)
ANTICOAGULATION CONSULT NOTE  Pharmacy Consult for Heparin Indication: chest pain/ACS  Patient Measurements: Height: 5' 5"  (165.1 cm) Weight: 101.2 kg (223 lb 1.7 oz) IBW/kg (Calculated) : 57 HEPARIN DW (KG): 80.2  Vital Signs: Temp: 98 F (36.7 C) (11/29 0739) Temp Source: Oral (11/29 0739) BP: 121/54 (11/29 0739) Pulse Rate: 79 (11/29 0739)  Labs: Recent Labs    03/05/20 2237 03/05/20 2237 03/06/20 0034 03/06/20 0036 03/06/20 0435 03/06/20 0435 03/06/20 1141 03/06/20 2211 03/07/20 0644  HGB 15.6*   < >  --   --  14.7  --   --   --  13.9  HCT 47.6*  --   --   --  44.1  --   --   --  42.7  PLT 216  --   --   --  212  --   --   --  184  APTT  --   --   --  29  --   --   --   --   --   LABPROT  --   --  11.9 12.0  --   --   --   --   --   INR  --   --  0.9 0.9  --   --   --   --   --   HEPARINUNFRC  --   --   --   --  0.41   < > 0.22* 0.30 0.46  CREATININE 0.96  --   --   --  0.79  --   --   --  0.83  TROPONINIHS 705*  --  901*  --   --   --   --   --   --    < > = values in this interval not displayed.   Estimated Creatinine Clearance: 79.7 mL/min (by C-G formula based on SCr of 0.83 mg/dL).  Medical History: Past Medical History:  Diagnosis Date  . Arthritis   . Asthma   . Complication of anesthesia    HARD TO WAKE UP  . COPD (chronic obstructive pulmonary disease) ([place])   . Crohn's disease ([place])   . Depression   . Diabetes mellitus ([place])   . Fracture, foot    post fall  . Hypercholesterolemia   . Hypertension   . IBS (irritable bowel syndrome)   . Nephrolithiasis    s/p lithotripsy [facility])  . Vitamin D deficiency    Medications:  Medications Prior to Admission  Medication Sig Dispense Refill Last Dose  . acetaminophen (TYLENOL) 325 MG tablet Take 2 tablets (650 mg total) by mouth every 6 (six) hours as needed for mild pain (or Fever >/= 101).   prn at prn  . albuterol (PROVENTIL) (2.5 MG/3ML) 0.083% nebulizer solution Take 2.5 mg by  nebulization every 6 (six) hours as needed for wheezing.   prn at prn  . aspirin EC 81 MG tablet Take 81 mg by mouth once. Swallow whole.   03/05/2020 at 2030  . buPROPion (WELLBUTRIN XL) 300 MG 24 hr tablet Take 1 tablet (300 mg total) by mouth daily. 90 tablet 1 03/05/2020 at 0830  . Dulaglutide (TRULICITY) 1.5 WL/7.9GX SOPN Inject 1.5 mg into the skin once a week. ON SUNDAYS   02/28/2020 at 0830  . fluticasone-salmeterol (ADVAIR HFA) 230-21 MCG/ACT inhaler Inhale 2 puffs into the lungs 2 (two) times daily.   03/05/2020 at 0830  . hyoscyamine (LEVSIN SL) 0.125 MG SL tablet Place 0.125 mg  under the tongue as needed.   prn at prn  . insulin glargine (LANTUS) 100 UNIT/ML injection Inject 20 Units into the skin at bedtime.    03/04/2020 at 2300  . ipratropium (ATROVENT) 0.06 % nasal spray Place 2 sprays into both nostrils 3 (three) times daily as needed for rhinitis.    prn at prn  . Ipratropium-Albuterol (COMBIVENT) 20-100 MCG/ACT AERS respimat Inhale 2 puffs into the lungs every 4 (four) hours while awake. (Patient taking differently: Inhale 1 puff into the lungs 2 (two) times daily. And as needed.) 4 g 2 03/05/2020 at 0830  . JARDIANCE 25 MG TABS tablet Take 25 mg by mouth daily.   03/05/2020 at 0830  . LORazepam (ATIVAN) 0.5 MG tablet Take 1/2 tablet prior to MRI.  May repeat x 1 prior to MRI if needed. 2 tablet 0 03/05/2020 at prn  . lovastatin (MEVACOR) 10 MG tablet Take one tablet q week. 15 tablet 1 03/02/2020 at 0830  . metFORMIN (GLUCOPHAGE) 500 MG tablet Take 2 tablets (1,000 mg total) by mouth 2 (two) times daily. 360 tablet 1 03/05/2020 at 0830  . metoprolol tartrate (LOPRESSOR) 25 MG tablet Take 1 tablet (25 mg total) by mouth 2 (two) times daily. 180 tablet 1 03/05/2020 at 0830  . RABEprazole (ACIPHEX) 20 MG tablet Take 1 tablet (20 mg total) by mouth in the morning and at bedtime. 180 tablet 1 03/05/2020 at 0830  . vitamin C (VITAMIN C) 1000 MG tablet Take 1 tablet (1,000 mg total) by  mouth daily.   Past Week at Unknown time  . ULTRACARE PEN NEEDLES 32G X 4 MM MISC       Assessment: 64 y.o. Caucasian female with a known history of type 2 diabetes mellitus, hypertension, sleep edema, IBS, asthma/COPD and Crohn's disease, who presented to the emergency room with acute onset of midsternal chest pain, HS troponin 705 >> 901, H&H, platelets stable.  No chronic anticoagulants prior to arrival  11/28 2211 HL 0.3  11/29 0644 HL 0.46   Goal of Therapy:  Heparin level 0.3-0.7 units/ml Monitor platelets by anticoagulation protocol: Yes   Plan:   Heparin level is therapeutic,. Will continue heparin infusion at 1300 units/hr  Re-check anti-Xa level and CBC with AM labs.   Plan for cath 11/29.   Oswald Hillock, PharmD, BCPS 03/07/2020,8:30 AM

## 2020-03-08 DIAGNOSIS — I214 Non-ST elevation (NSTEMI) myocardial infarction: Secondary | ICD-10-CM | POA: Diagnosis not present

## 2020-03-08 DIAGNOSIS — Z955 Presence of coronary angioplasty implant and graft: Secondary | ICD-10-CM

## 2020-03-08 LAB — COMPREHENSIVE METABOLIC PANEL
ALT: 23 U/L (ref 0–44)
AST: 17 U/L (ref 15–41)
Albumin: 3.3 g/dL — ABNORMAL LOW (ref 3.5–5.0)
Alkaline Phosphatase: 48 U/L (ref 38–126)
Anion gap: 10 (ref 5–15)
BUN: 18 mg/dL (ref 8–23)
CO2: 24 mmol/L (ref 22–32)
Calcium: 8.8 mg/dL — ABNORMAL LOW (ref 8.9–10.3)
Chloride: 109 mmol/L (ref 98–111)
Creatinine, Ser: 0.83 mg/dL (ref 0.44–1.00)
GFR, Estimated: >60 mL/min (ref >60)
Glucose, Bld: 149 mg/dL — ABNORMAL HIGH (ref 70–99)
Potassium: 4.1 mmol/L (ref 3.5–5.1)
Sodium: 143 mmol/L (ref 135–145)
Total Bilirubin: 0.5 mg/dL (ref 0.3–1.2)
Total Protein: 6.6 g/dL (ref 6.5–8.1)

## 2020-03-08 LAB — CBC
HCT: 42.1 % (ref 36.0–46.0)
Hemoglobin: 13.7 g/dL (ref 12.0–15.0)
MCH: 28.2 pg (ref 26.0–34.0)
MCHC: 32.5 g/dL (ref 30.0–36.0)
MCV: 86.6 fL (ref 80.0–100.0)
Platelets: 213 10*3/uL (ref 150–400)
RBC: 4.86 MIL/uL (ref 3.87–5.11)
RDW: 15.9 % — ABNORMAL HIGH (ref 11.5–15.5)
WBC: 10.1 10*3/uL (ref 4.0–10.5)
nRBC: 0 % (ref 0.0–0.2)

## 2020-03-08 LAB — GLUCOSE, CAPILLARY: Glucose-Capillary: 156 mg/dL — ABNORMAL HIGH (ref 70–99)

## 2020-03-08 MED ORDER — ASPIRIN 81 MG PO CHEW: 81.0000 mg | CHEWABLE_TABLET | Freq: Every day | ORAL | 0 refills | Status: DC

## 2020-03-08 MED ORDER — FLUTICASONE PROPIONATE 50 MCG/ACT NA SUSP
1.0000 | Freq: Every day | NASAL | 2 refills | Status: DC
Start: 2020-03-08 — End: 2020-03-10

## 2020-03-08 MED ORDER — ROSUVASTATIN CALCIUM 5 MG PO TABS: 5.0000 mg | ORAL_TABLET | Freq: Every day | ORAL | 0 refills | Status: DC

## 2020-03-08 MED ORDER — LOSARTAN POTASSIUM 25 MG PO TABS: 25.0000 mg | ORAL_TABLET | Freq: Every day | ORAL | 0 refills | Status: AC

## 2020-03-08 MED ORDER — PRASUGREL HCL 10 MG PO TABS: 10.0000 mg | ORAL_TABLET | Freq: Every day | ORAL | 0 refills | Status: DC

## 2020-03-08 NOTE — Progress Notes (Signed)
[facility]    SUBJECTIVE: Amanda Wiley is a 64 year old female with a past medical history significant for type 2 diabetes, COPD, Crohn's disease, IBS, hypertension, and sleep apnea who presented to the ED on 03/05/20 for an acute onset of chest pain.  Workup in the ED included high sensitivity troponin elevated at 705 and 901, ECG revealed anterior T wave abnormalities, and chest xray revealed retrocardiac airspace opacity consistent with infection vs. inflammation.  She underwent LHC with Dr. Saralyn Pilar on 03/07/20 which revealed one-vessel coronary artery disease with 90% stenosis in the mid LAD that was successfully treated with overlapping drug eluding stents.    Echocardiogram this admission revealed normal LV systolic function with an EF estimated between 55-60% with normal RV systolic function; no significant valvular abnormalities.   03/08/20: Amanda Wiley is sitting up in bed, eating, in no acute distress.  She reports the chest pain with associated right arm pain and jaw pain has completely resolved.  She denies shortness of breath, lower extremity swelling, orthopnea, or PND.  She denies syncopal or presyncopal episodes.     Vitals:   03/07/20 2117 03/08/20 0119 03/08/20 0349 03/08/20 0433  BP: (!) 122/58 131/60 137/60   Pulse: 78 72 82   Resp: 18 16 20    Temp: 98.1 F (36.7 C) 97.7 F (36.5 C) (!) 97.5 F (36.4 C)   TempSrc: Oral Oral Oral   SpO2: 92% 93% 93%   Weight:    102 kg  Height:         Intake/Output Summary (Last 24 hours) at 03/08/2020 0071 Last data filed at 03/08/2020 2197 Gross per 24 hour  Intake 1320 ml  Output 3700 ml  Net -2380 ml      PHYSICAL EXAM  General: Well developed, obese, in no acute distress HEENT:  Normocephalic and atramatic Neck:  No JVD.  Lungs: Clear bilaterally to auscultation and percussion. Heart: HRRR . Normal S1 and S2 without gallops or murmurs.  Abdomen: Bowel sounds are positive, abdomen soft and non-tender  Msk:  Back  normal. Normal strength and tone for age. Extremities: No clubbing, cyanosis or edema.   Neuro: Alert and oriented X 3. Psych:  Good affect, responds appropriately   LABS: Basic Metabolic Panel: Recent Labs    03/07/20 0644 03/08/20 0526  NA 141 143  K 3.3* 4.1  CL 109 109  CO2 22 24  GLUCOSE 165* 149*  Wiley 15 18  CREATININE 0.83 0.83  CALCIUM 8.5* 8.8*   Liver Function Tests: Recent Labs    03/07/20 0644 03/08/20 0526  AST 26 17  ALT 23 23  ALKPHOS 51 48  BILITOT 0.8 0.5  PROT 6.2* 6.6  ALBUMIN 3.2* 3.3*   No results for input(s): LIPASE, AMYLASE in the last 72 hours. CBC: Recent Labs    03/07/20 0644 03/08/20 0526  WBC 9.4 10.1  HGB 13.9 13.7  HCT 42.7 42.1  MCV 86.4 86.6  PLT 184 213   Cardiac Enzymes: No results for input(s): CKTOTAL, CKMB, CKMBINDEX, TROPONINI in the last 72 hours. BNP: Invalid input(s): POCBNP D-Dimer: No results for input(s): DDIMER in the last 72 hours. Hemoglobin A1C: No results for input(s): HGBA1C in the last 72 hours. Fasting Lipid Panel: No results for input(s): CHOL, HDL, LDLCALC, TRIG, CHOLHDL, LDLDIRECT in the last 72 hours. Thyroid Function Tests: No results for input(s): TSH, T4TOTAL, T3FREE, THYROIDAB in the last 72 hours.  Invalid input(s): FREET3 Anemia Panel: No results for input(s): VITAMINB12, FOLATE, FERRITIN,  TIBC, IRON, RETICCTPCT in the last 72 hours.  CT CHEST W CONTRAST  Result Date: 03/06/2020 CLINICAL DATA:  Follow-up abnormal chest radiograph. Rule out metastatic breast cancer. EXAM: CT CHEST WITH CONTRAST TECHNIQUE: Multidetector CT imaging of the chest was performed during intravenous contrast administration. CONTRAST:  70m OMNIPAQUE IOHEXOL 300 MG/ML  SOLN COMPARISON:  CT angio chest 02/02/19 FINDINGS: Cardiovascular: The heart size appears within normal limits. No pericardial effusion. Aortic atherosclerosis. Coronary artery calcifications. Mediastinum/Nodes: Normal appearance of the thyroid gland.  The trachea appears patent and is midline. Normal appearance of the esophagus. No enlarged axillary, supraclavicular, mediastinal, or hilar lymph nodes. Lungs/Pleura: There are coarsened interstitial markings identified bilaterally, likely the sequelae of prior inflammation/infection. Mosaic attenuation is noted within the upper lung zones with areas of air trapping, likely reflecting small airways disease. Scar like density with surrounding architectural distortion is identified within the posterior left lower lobe, image 105/3, which likely accounts for the chest radiograph abnormality. Subpleural reticulation and scarring is also noted within the posterior right lung base. There is a calcified granuloma noted within the posterior right base, image 112/3. No suspicious pulmonary nodules to suggest metastatic disease Upper Abdomen: No acute abnormality identified within the imaged portions of the upper abdomen. Previous cholecystectomy. Scarring is noted involving the left kidney. Musculoskeletal: Postoperative changes from ORIF of the right proximal humerus noted. No acute or suspicious osseous abnormality. Small bone island noted within the upper thoracic spine measuring 5 mm. No signs of chest wall mass identified. IMPRESSION: 1. No findings to suggest metastatic disease to the chest. 2. Bilateral lower lobe areas of scarring and architectural distortion are identified. Findings are favored to represent sequelae of inflammation/infection. 3. Mosaic attenuation pattern identified within the upper lung zones likely reflecting small airways disease. 4. Aortic atherosclerosis. Coronary artery calcifications noted. Aortic Atherosclerosis (ICD10-I70.0). Electronically Signed   By: TKerby MoorsM.D.   On: 03/06/2020 13:08   CARDIAC CATHETERIZATION  Result Date: 03/07/2020  Mid LAD lesion is 90% stenosed.  Prox RCA lesion is 20% stenosed.  Dist LAD lesion is 40% stenosed.  A drug-eluting stent was  successfully placed.  Post intervention, there is a 0% residual stenosis.  A drug-eluting stent was successfully placed.  1.  One-vessel coronary artery disease with 90% stenosis mid LAD 2.  Normal left ventricular function 3.  Successful PCI, with overlapping DES mid LAD Recommendations 1.  Dual antiplatelet therapy uninterrupted for 1 year 2.  Aggressive risk factor modification 3.  Likely discharge home in a.m.   ECHOCARDIOGRAM COMPLETE  Result Date: 03/07/2020    ECHOCARDIOGRAM REPORT   Patient Name:   Amanda JOHANNSENDate of Exam: 03/06/2020 Medical Rec #:  [id number]     Height:       65.0 in Accession #:    [id number]    Weight:       220.2 lb Date of Birth:  [birth year]    BSA:          2.061 m Patient Age:    89years       BP:           104/57 mmHg Patient Gender: F              HR:           63 bpm. Exam Location:  [facility] Procedure: 2D Echo, Cardiac Doppler and Color Doppler Indications:     NSTEMI I21.4  History:  Patient has prior history of Echocardiogram examinations. Risk                  Factors:Hypertension and Diabetes.  Sonographer:     Alyse Low Roar Referring Phys:  [id number] [place] Diagnosing Phys: Bartholome Bill MD IMPRESSIONS  1. Left ventricular ejection fraction, by estimation, is 55 to 60%. The left ventricle has normal function. The left ventricle has no regional wall motion abnormalities. Left ventricular diastolic parameters were normal.  2. Right ventricular systolic function is normal. The right ventricular size is normal.  3. Left atrial size was mildly dilated.  4. The mitral valve is grossly normal. Trivial mitral valve regurgitation.  5. The aortic valve is grossly normal. Aortic valve regurgitation is not visualized. FINDINGS  Left Ventricle: Left ventricular ejection fraction, by estimation, is 55 to 60%. The left ventricle has normal function. The left ventricle has no regional wall motion abnormalities. The left ventricular internal cavity size was normal in size.  There is  borderline left ventricular hypertrophy. Left ventricular diastolic parameters were normal. Right Ventricle: The right ventricular size is normal. No increase in right ventricular wall thickness. Right ventricular systolic function is normal. Left Atrium: Left atrial size was mildly dilated. Right Atrium: Right atrial size was normal in size. Pericardium: There is no evidence of pericardial effusion. Mitral Valve: The mitral valve is grossly normal. Trivial mitral valve regurgitation. Tricuspid Valve: The tricuspid valve is grossly normal. Tricuspid valve regurgitation is trivial. Aortic Valve: The aortic valve is grossly normal. Aortic valve regurgitation is not visualized. Aortic valve peak gradient measures 7.8 mmHg. Pulmonic Valve: The pulmonic valve was not well visualized. Pulmonic valve regurgitation is trivial. Aorta: The aortic root is normal in size and structure. IAS/Shunts: The atrial septum is grossly normal.  LEFT VENTRICLE PLAX 2D LVIDd:         4.71 cm Diastology LVIDs:         3.26 cm LV e' medial:    5.98 cm/s LV PW:         0.93 cm LV E/e' medial:  16.0 LV IVS:        1.12 cm LV e' lateral:   9.36 cm/s                        LV E/e' lateral: 10.2  RIGHT VENTRICLE RV Mid diam:    2.76 cm RV S prime:     11.70 cm/s TAPSE (M-mode): 1.9 cm LEFT ATRIUM             Index       RIGHT ATRIUM           Index LA diam:        3.90 cm 1.89 cm/m  RA Area:     12.80 cm LA Vol (A2C):   62.6 ml 30.38 ml/m RA Volume:   29.90 ml  14.51 ml/m LA Vol (A4C):   47.0 ml 22.81 ml/m LA Biplane Vol: 54.4 ml 26.40 ml/m  AORTIC VALVE              PULMONIC VALVE AV Vmax:      140.00 cm/s PV Vmax:        0.92 m/s AV Peak Grad: 7.8 mmHg    PV Peak grad:   3.4 mmHg LVOT Vmax:    104.00 cm/s RVOT Peak grad: 2 mmHg  AORTA Ao Root diam: 2.70 cm MITRAL VALVE MV Area (PHT): 3.08 cm MV Decel Time:  246 msec MV E velocity: 95.50 cm/s MV A velocity: 77.10 cm/s MV E/A ratio:  1.24 MV A Prime:    8.2 cm/s Bartholome Bill MD  Electronically signed by Bartholome Bill MD Signature Date/Time: 03/07/2020/7:30:49 AM    Final      Echo: Normal RV and LV systolic function with an EF estimated between 55-60%.   TELEMETRY: Normal sinus rhythm   ASSESSMENT AND PLAN:  Active Problems:   NSTEMI (non-ST elevated myocardial infarction) ([place])    1.  NSTEMI   -S/p PCI to the LAD with 2 drug eluding stents   -Will continue uninterrupted dual antiplatelet therapy of aspirin 24m daily and prasugrel 180mdaily   -Will continue home dose of metoprolol tartrate 2565mID   -History of cough on ACE inhibitors; will discharge on losartan 48m55mily   -History of myalgias with high intensity statin; will discharge on rosuvastatin 5mg 35mly and titrate dose if tolerated   -Cardiac rehab ordered   -Recommend follow up with Dr. Fath Ubaldo GlassingicolDoristine Mango within 7-10 days of discharge    [facility] Discharge   Aspirin prescribed at discharge:  Yes  High Intensity Statin Prescribed? (Lipitor 40-80mg 37mrestor 20-40mg):10m Intolerance to high intensity statins, both Crestor and Lipitor; will discharge on rosuvastatin 5mg and31mtrate dose in an outpatient setting if tolerated   Beta Blocker Prescribed: Yes  ADP Receptor Inhibitor Prescribed? (i.e. Plavix etc.-Includes Medically Managed Patients): Yes  ACEI/ARB Prescribed? (If NO document contraindications)  Yes  Aldosterone Inhibitor Prescribed? No: Not indicated; LV systolic function normal  Was EF assessed during THIS hospitalization? Yes  (YES = Measured in current episode of care or document plan to evaluate after discharge.)  Was EF < 40% ? No: 55-60%  Was Cardiac Rehab II ordered? (Includes Medically managed Patients): Yes  Was Smoking Cessation Advice provided?  No: Not indicated    The history, physical exam findings, and plan of care were all discussed with Dr. Kenneth Bartholome Billl decision making was made in collaboration.   Riki Gehring LAvie Arenas11/30/2021 7:42 AM

## 2020-03-08 NOTE — Progress Notes (Signed)
Pt in bed. Pt alert and oriented x 4. Pt stated that her left forearm IV hurt, I tried to flush the IV but it was occluded. I removed the IV. Pt still has a left posterior hand IV. Pt states that she has been emotional today now that she knows everything is okay she has thought about how it could have been worse if she did not come in. Will continue pt care.

## 2020-03-08 NOTE — Discharge Instructions (Signed)
Angina  Angina is very bad discomfort or pain in the chest, neck, arm, jaw, or back. The discomfort is caused by a lack of blood in the middle layer of the heart wall (myocardium). What are the causes? This condition is caused by a buildup of fat and cholesterol (plaque) in your arteries (atherosclerosis). This buildup narrows the arteries and makes it hard for blood to flow. What increases the risk? You are more likely to develop this condition if:  You have high levels of cholesterol in your blood.  You have high blood pressure (hypertension).  You have diabetes.  You have a family history of heart disease.  You are not active, or you do not exercise enough.  You feel sad (depressed).  You have been treated with high energy rays (radiation) on the left side of your chest. Other risk factors are:  Using tobacco.  Being very overweight (obese).  Eating a diet high in unhealthy fats (saturated fats).  Having stress, or being exposed to things that cause stress.  Using drugs, such as cocaine. Women have a greater risk for angina if:  They are older than 72.  They have stopped having their period (are in postmenopause). What are the signs or symptoms? Common symptoms of this condition in both men and women may include:  Chest pain, which may: ? Feel like a crushing or squeezing in the chest. ? Feel like a tightness, pressure, fullness, or heaviness in the chest. ? Last for more than a few minutes at a time. ? Stop and come back (recur) after a few minutes.  Pain in the neck, arm, jaw, or back.  Heartburn or upset stomach (indigestion) for no reason.  Being short of breath.  Feeling sick to your stomach (nauseous).  Sudden cold sweats. Women and people with diabetes may have other symptoms that are not usual, such as feeling:  Tired (fatigue).  Worried or nervous (anxious) for no reason.  Weak for no reason.  Dizzy or passing out (fainting). How is this  treated? This condition may be treated with:  Medicines. These are given to: ? Prevent blood clots. ? Prevent heart attack. ? Relax blood vessels and improve blood flow to the heart (nitrates). ? Reduce blood pressure. ? Improve the pumping action of the heart. ? Reduce fat and cholesterol in the blood.  A procedure to widen a narrowed or blocked artery in the heart (angioplasty).  Surgery to allow blood to go around a blocked artery (coronary artery bypass surgery). Follow these instructions at home: Medicines  Take over-the-counter and prescription medicines only as told by your doctor.  Do not take these medicines unless your doctor says that you can: ? NSAIDs. These include:  Ibuprofen.  Naproxen. ? Vitamin supplements that have vitamin A, vitamin E, or both. ? Hormone therapy that contains estrogen with or without progestin. Eating and drinking   Eat a heart-healthy diet that includes: ? Lots of fresh fruits and vegetables. ? Whole grains. ? Low-fat (lean) protein. ? Low-fat dairy products.  Follow instructions from your doctor about what you cannot eat or drink. Activity  Follow an exercise program that your doctor tells you.  Talk with your doctor about joining a program to help improve the health of your heart (cardiac rehab).  When you feel tired, take a break. Plan breaks if you know you are going to feel tired. Lifestyle   Do not use any products that contain nicotine or tobacco. This includes cigarettes, e-cigarettes, and  chewing tobacco. If you need help quitting, ask your doctor.  If your doctor says you can drink alcohol: ? Limit how much you use to:  0-1 drink a day for women who are not pregnant.  0-2 drinks a day for men. ? Be aware of how much alcohol is in your drink. In the [place] one drink equals:  One 12 oz bottle of beer (355 mL).  One 5 oz glass of wine (148 mL).  One 1 oz glass of hard liquor (44 mL). General instructions  Stay  at a healthy weight. If your doctor tells you to do so, work with him or her to lose weight.  Learn to deal with stress. If you need help, ask your doctor.  Keep your vaccines up to date. Get a flu shot every year.  Talk with your doctor if you feel sad. Take a screening test to see if you are at risk for depression.  Work with your doctor to manage any other health problems that you have. These may include diabetes or high blood pressure.  Keep all follow-up visits as told by your doctor. This is important. Get help right away if:  You have pain in your chest, neck, arm, jaw, or back, and the pain: ? Lasts more than a few minutes. ? Comes back. ? Does not get better after you take medicine under your tongue (sublingual nitroglycerin). ? Keeps getting worse. ? Comes more often.  You have any of these problems for no reason: ? Sweating a lot. ? Heartburn or upset stomach. ? Shortness of breath. ? Trouble breathing. ? Feeling sick to your stomach. ? Throwing up (vomiting). ? Feeling more tired than normal. ? Feeling nervous or worrying more than normal. ? Weakness.  You are suddenly dizzy or light-headed.  You pass out. These symptoms may be an emergency. Do not wait to see if the symptoms will go away. Get medical help right away. Call your local emergency services (911 in the [place] Do not drive yourself to the hospital. Summary  Angina is very bad discomfort or pain in the chest, neck, arm, neck, or back.  Symptoms include chest pain, heartburn or upset stomach for no reason, and shortness of breath.  Women or people with diabetes may have symptoms that are not usual, such as feeling nervous or worried for no reason, weak for no reason, or tired.  Take all medicines only as told by your doctor.  You should eat a heart-healthy diet and follow an exercise program. This information is not intended to replace advice given to you by your health care provider. Make sure you  discuss any questions you have with your health care provider. Document Revised: 11/11/2017 Document Reviewed: 11/11/2017 Elsevier Patient Education  2020 [employer].   Heart Attack A heart attack occurs when blood and oxygen supply to the heart is cut off. A heart attack causes damage to the heart that cannot be fixed. A heart attack is also called a myocardial infarction, or MI. If you think you are having a heart attack, do not wait to see if the symptoms will go away. Get medical help right away. What are the causes? This condition may be caused by:  A fatty substance (plaque) in the blood vessels (arteries). This can block the flow of blood to the heart.  A blood clot in the blood vessels that go to the heart. The blood clot blocks blood flow.  Low blood pressure.  An abnormal heartbeat.  Some diseases, such as problems in red blood cells (anemia)orproblems in breathing (respiratory failure).  Tightening (spasm) of a blood vessel that cuts off blood to the heart.  A tear in a blood vessel of the heart.  High blood pressure. What increases the risk? The following factors may make you more likely to develop this condition:  Aging. The older you are, the higher your risk.  Having a personal or family history of chest pain, heart attack, stroke, or narrowing of the arteries in the legs, arms, head, or stomach (peripheral artery disease).  Being female.  Smoking.  Not getting regular exercise.  Being overweight or obese.  Having high blood pressure.  Having high cholesterol.  Having diabetes.  Drinking too much alcohol.  Using illegal drugs, such as cocaine or methamphetamine. What are the signs or symptoms? Symptoms of this condition include:  Chest pain. It may feel like: ? Crushing or squeezing. ? Tightness, pressure, fullness, or heaviness.  Pain in the arm, neck, jaw, back, or upper body.  Shortness of breath.  Heartburn.  Upset stomach  (indigestion).  Feeling like you may vomit (nauseous).  Cold sweats.  Feeling tired.  Sudden light-headedness. How is this treated? A heart attack must be treated as soon as possible. Treatment may include:  Medicines to: ? Break up or dissolve blood clots. ? Thin blood and help prevent blood clots. ? Treat blood pressure. ? Improve blood flow to the heart. ? Reduce pain. ? Reduce cholesterol.  Procedures to widen a blocked artery and keep it open.  Open heart surgery.  Receiving oxygen.  Making your heart strong again (cardiac rehabilitation) through exercise, education, and counseling. Follow these instructions at home: Medicines  Take over-the-counter and prescription medicines only as told by your doctor. You may need to take medicine: ? To keep your blood from clotting too easily. ? To control blood pressure. ? To lower cholesterol. ? To control heart rhythms.  Do not take these medicines unless your doctor says it is okay: ? NSAIDs, such as ibuprofen. ? Supplements that have vitamin A, vitamin E, or both. ? Hormone replacement therapy that has estrogen with or without progestin. Lifestyle      Do not use any products that have nicotine or tobacco, such as cigarettes, e-cigarettes, and chewing tobacco. If you need help quitting, ask your doctor.  Avoid secondhand smoke.  Exercise regularly. Ask your doctor about a cardiac rehab program.  Eat heart-healthy foods. Your doctor will tell you what foods to eat.  Stay at a healthy weight.  Lower your stress level.  Do not use illegal drugs. Alcohol use  Do not drink alcohol if: ? Your doctor tells you not to drink. ? You are pregnant, may be pregnant, or are planning to become pregnant.  If you drink alcohol: ? Limit how much you use to:  0-1 drink a day for women.  0-2 drinks a day for men. ? Know how much alcohol is in your drink. In the [place] one drink equals one 12 oz bottle of beer (355 mL),  one 5 oz glass of wine (148 mL), or one 1 oz glass of hard liquor (44 mL). General instructions  Work with your doctor to treat other problems you may have, such as diabetes or high blood pressure.  Get screened for depression. Get treatment if needed.  Keep your vaccines up to date. Get the flu shot (influenza vaccine) every year.  Keep all follow-up visits as told by your doctor.  This is important. Contact a doctor if:  You feel very sad.  You have trouble doing your daily activities. Get help right away if:  You have sudden, unexplained discomfort in your chest, arms, back, neck, jaw, or upper body.  You have shortness of breath.  You have sudden sweating or clammy skin.  You feel like you may vomit.  You vomit.  You feel tired or weak.  You get light-headed or dizzy.  You feel your heart beating fast.  You feel your heart skipping beats.  You have blood pressure that is higher than 180/120. These symptoms may be an emergency. Do not wait to see if the symptoms will go away. Get medical help right away. Call your local emergency services (911 in the [place] Do not drive yourself to the hospital. Summary  A heart attack occurs when blood and oxygen supply to the heart is cut off.  Do not take NSAIDs unless your doctor says it is okay.  Do not smoke. Avoid secondhand smoke.  Exercise regularly. Ask your doctor about a cardiac rehab program. This information is not intended to replace advice given to you by your health care provider. Make sure you discuss any questions you have with your health care provider. Document Revised: 07/07/2018 Document Reviewed: 07/07/2018 Elsevier Patient Education  [place].

## 2020-03-08 NOTE — Discharge Summary (Addendum)
Physician Discharge Summary  Amanda Wiley XKG:[id number] DOB: 09/13/1955 DOA: 03/05/2020  PCP: Einar Pheasant, MD  Admit date: 03/05/2020 Discharge date: 03/08/2020  Time spent: 37 minutes  Recommendations for Outpatient Follow-up:  1. Please obtain Chem-12, CBC in about 1 week 2. New meds: Metoprolol aspirin prasugrel Crestor  3. Outpatient cardiac rehab being contemplated  Discharge Diagnoses:  Active Problems:   NSTEMI (non-ST elevated myocardial infarction) [facility])   Discharge Condition: Improved  Diet recommendation: Diabetic heart healthy  Filed Weights   03/07/20 0438 03/07/20 0739 03/08/20 0433  Weight: 101.2 kg 101.2 kg 102 kg    History of present illness:  17 white female status post left breast duct excision 09/04/2019 Recent colonoscopy 11/3 with sigmoid and descending colon diverticulosis DM TY 2 followed by endocrinology Dr. Sharrie Rothman HLD, reflux, BMI 37 Crohn's disease asthma polycythemia vera, depression anxiety obstructive sleep apnea not on CPAP and prior cardiac cath 15 years ago  Presented to [facility] ED for central chest pain right arm and shoulder radiation intermittent-initial troponin VII 105-CXR retrocardiac opacity Received aspirin prior to arrival In emergency room given aspirin Nitropaste heparin GTT EKG showed T wave inversion V1 through 3 although this is not new Admitted for NSTEMI-secondary to abnormal CXR CT chest was ordered cardiology was consulted Dr. Mamie Levers Course:   NSTEMI with prior history of cardiac cath about 2006 a. Cardiology catheterization with PCI to LAD on 11/29-needs aspirin Plavix at least 1 year uninterrupted b. Continuing metoprolol 25 twice daily,  Nitropaste discontinued-per cardiology discharging on aspirin and prasugrel Crestor 1. Sepsis ruled out on admission lactic acidosis 3.0 on admission 2. Abnormal CXR in the setting of Left breast duct excision 09/04/2018 a. CXR abnormal appearing-CT chest shows chronic lung  changes without malignancy b. Patient is aware of chronic findings in the lungs 3. DM TY 2 a. Continue dulaglutide 1.5 weekly, Jardiance 25 and Lantus 20 in addition to sensitive sliding scale--Metformin held initially and resume 11/30 on discharge b. CBG ranging 1 65-2 72 no changes to regimen today 4. Bipolar a. Continue Xanax 0.25 twice daily as needed anxiety, Wellbutrin 300 XL, Ambien 5 5. Asthma  a. Continue Advair twice daily, Combivent every 4 as needed may need to add back albuterol 6. Sigmoid diverticulosis with history of Crohn's a. Holding at this time Levsin  Chronic issues stable at present 8. HLD 9. Reflux 10. Polycythemia vera 11. Anemia BMI 36   Consultants:   Cardiologist  Procedures:  Echocardiogram 11/28 EF 55-60% no wall motion abnormalities-valves seem normal per echo  Cardiac cath 11/29  Mid LAD lesion is 90% stenosed.  Prox RCA lesion is 20% stenosed.  Dist LAD lesion is 40% stenosed.  A drug-eluting stent was successfully placed.  Post intervention, there is a 0% residual stenosis.  A drug-eluting stent was successfully placed.  1. One-vessel coronary artery disease with 90% stenosis mid LAD 2. Normal left ventricular function 3. Successful PCI, with overlapping DES mid LAD  Recommendations  1. Dual antiplatelet therapy uninterrupted for 1 year 2. Aggressive risk factor modification 3. Likely discharge home in a.m.   Discharge Exam: Vitals:   03/08/20 0349 03/08/20 0826  BP: 137/60 (!) 124/108  Pulse: 82 68  Resp: 20 18  Temp: (!) 97.5 F (36.4 C) 97.8 F (36.6 C)  SpO2: 93% 96%  Patient doing fair Does not like potassium pills No chest pain Tolerating diet  Ready to go home  General: Awake alert coherent no distress EOMI NCAT no focal  deficit Cardiovascular: S1-S2 no murmur no rub no gallop NSVT 4 beats only Respiratory: Clear no added sound no rales no rhonchi no wheeze Abdomen soft nontender no rebound no  guarding Right arm has mild hematoma at the area of cath site left arm also has some bruising Neurologically intact no focal deficit moving all 4 limbs equally independent  Discharge Instructions   Discharge Instructions    AMB Referral to Cardiac Rehabilitation - Phase II   Complete by: As directed    Diagnosis:  Coronary Stents NSTEMI     After initial evaluation and assessments completed: Virtual Based Care may be provided alone or in conjunction with Phase 2 Cardiac Rehab based on patient barriers.: Yes   Diet - low sodium heart healthy   Complete by: As directed    Discharge instructions   Complete by: As directed    Please look at your medications carefully as you have additions of various new blood thinners as per the cardiology group who saw you and made those recommendations You can resume your other medications as per home regimen-he will need cardiac rehab to help you rehabilitate your heart from heart blockage that you hadblk fem and you will need to follow-up with cardiology so please ensure to follow the instructions on your discharge paperwork Please get lab work either at the cardiology office or at your primary care office within 1 to 2 weeks as some of your medications affect your kidney function etc. Keep a record of your blood pressure etc. and get a blood pressure cuff in the outpatient setting There has been no change in one of your cholesterol medications please take note of that If you experience bleeding, severe shortness of breath or chest pain similar to prior to admission please follow-up in the emergency room for this   Increase activity slowly   Complete by: As directed      Allergies as of 03/08/2020      Reactions   Avelox [moxifloxacin Hcl In Nacl] Hives, Shortness Of Breath, Other (See Comments)   Moxifloxacin Hives, Shortness Of Breath   Allegra [fexofenadine Hcl] Other (See Comments)   headaches   Ibuprofen Swelling   FACIAL   Sulfa Antibiotics  Other (See Comments)   Difficulty breathing, rash, tongue swelling   Tegretol [carbamazepine] Other (See Comments)   Other reaction(s): Other (See Comments) Vomiting Vomiting   Iodine Rash   Ioxaglate Rash   Ivp Dye [iodinated Diagnostic Agents] Rash      Medication List    STOP taking these medications   aspirin EC 81 MG tablet Replaced by: aspirin 81 MG chewable tablet   lovastatin 10 MG tablet Commonly known as: MEVACOR     TAKE these medications   acetaminophen 325 MG tablet Commonly known as: TYLENOL Take 2 tablets (650 mg total) by mouth every 6 (six) hours as needed for mild pain (or Fever >/= 101).   albuterol (2.5 MG/3ML) 0.083% nebulizer solution Commonly known as: PROVENTIL Take 2.5 mg by nebulization every 6 (six) hours as needed for wheezing.   ascorbic acid 1000 MG tablet Commonly known as: VITAMIN C Take 1 tablet (1,000 mg total) by mouth daily.   aspirin 81 MG chewable tablet Chew 1 tablet (81 mg total) by mouth daily. Replaces: aspirin EC 81 MG tablet   buPROPion 300 MG 24 hr tablet Commonly known as: WELLBUTRIN XL Take 1 tablet (300 mg total) by mouth daily.   fluticasone 50 MCG/ACT nasal spray Commonly known as: FLONASE  Place 1 spray into both nostrils daily. What changed:   when to take this  reasons to take this   fluticasone-salmeterol 230-21 MCG/ACT inhaler Commonly known as: ADVAIR HFA Inhale 2 puffs into the lungs 2 (two) times daily.   hyoscyamine 0.125 MG SL tablet Commonly known as: LEVSIN SL Place 0.125 mg under the tongue as needed.   insulin glargine 100 UNIT/ML injection Commonly known as: LANTUS Inject 20 Units into the skin at bedtime.   ipratropium 0.06 % nasal spray Commonly known as: ATROVENT Place 2 sprays into both nostrils 3 (three) times daily as needed for rhinitis.   Ipratropium-Albuterol 20-100 MCG/ACT Aers respimat Commonly known as: COMBIVENT Inhale 2 puffs into the lungs every 4 (four) hours while  awake. What changed:   how much to take  when to take this  additional instructions   Jardiance 25 MG Tabs tablet Generic drug: empagliflozin Take 25 mg by mouth daily.   LORazepam 0.5 MG tablet Commonly known as: ATIVAN Take 1/2 tablet prior to MRI.  May repeat x 1 prior to MRI if needed.   losartan 25 MG tablet Commonly known as: Cozaar Take 1 tablet (25 mg total) by mouth daily.   metFORMIN 500 MG tablet Commonly known as: GLUCOPHAGE Take 2 tablets (1,000 mg total) by mouth 2 (two) times daily.   metoprolol tartrate 25 MG tablet Commonly known as: LOPRESSOR Take 1 tablet (25 mg total) by mouth 2 (two) times daily.   prasugrel 10 MG Tabs tablet Commonly known as: EFFIENT Take 1 tablet (10 mg total) by mouth daily.   RABEprazole 20 MG tablet Commonly known as: ACIPHEX Take 1 tablet (20 mg total) by mouth in the morning and at bedtime.   rosuvastatin 5 MG tablet Commonly known as: Crestor Take 1 tablet (5 mg total) by mouth daily.   Trulicity 1.5 PZ/0.2HE Sopn Generic drug: Dulaglutide Inject 1.5 mg into the skin once a week. ON SUNDAYS   [employer]tracare Pen Needles 32G X 4 MM Misc Generic drug: Insulin Pen Needle      Allergies  Allergen Reactions  . Avelox [Moxifloxacin Hcl In Nacl] Hives, Shortness Of Breath and Other (See Comments)  . Moxifloxacin Hives and Shortness Of Breath  . Allegra [Fexofenadine Hcl] Other (See Comments)    headaches  . Ibuprofen Swelling    FACIAL  . Sulfa Antibiotics Other (See Comments)    Difficulty breathing, rash, tongue swelling  . Tegretol [Carbamazepine] Other (See Comments)    Other reaction(s): Other (See Comments) Vomiting Vomiting   . Iodine Rash  . Ioxaglate Rash  . Ivp Dye [Iodinated Diagnostic Agents] Rash    Follow-up Information    Teodoro Spray, MD Follow up in 1 week(s).   Specialty: Cardiology Contact information: [place] [address] [phone]                The  results of significant diagnostics from this hospitalization (including imaging, microbiology, ancillary and laboratory) are listed below for reference.    Significant Diagnostic Studies: DG Chest 2 View  Result Date: 03/05/2020 CLINICAL DATA:  Chest pain.  Right arm into jaw. EXAM: CHEST - 2 VIEW COMPARISON:  Chest x-ray 02/07/2019 FINDINGS: The heart size and mediastinal contours unchanged. Biapical pleural/pulmonary scarring. Interval development of a retrocardiac airspace opacity. Similar appearing coarsened interstitial markings. No pulmonary edema. No pleural effusion. No pneumothorax. No acute osseous abnormality.  Right humeral surgical hardware. IMPRESSION: Interval development of a retrocardiac airspace opacity that could  represent infection or inflammation. Followup PA and lateral chest X-ray is recommended in 3-4 weeks following trial therapy to ensure resolution and exclude underlying malignancy. Electronically Signed   By: Iven Finn M.D.   On: 03/05/2020 22:59   CT CHEST W CONTRAST  Result Date: 03/06/2020 CLINICAL DATA:  Follow-up abnormal chest radiograph. Rule out metastatic breast cancer. EXAM: CT CHEST WITH CONTRAST TECHNIQUE: Multidetector CT imaging of the chest was performed during intravenous contrast administration. CONTRAST:  5m OMNIPAQUE IOHEXOL 300 MG/ML  SOLN COMPARISON:  CT angio chest 02/02/19 FINDINGS: Cardiovascular: The heart size appears within normal limits. No pericardial effusion. Aortic atherosclerosis. Coronary artery calcifications. Mediastinum/Nodes: Normal appearance of the thyroid gland. The trachea appears patent and is midline. Normal appearance of the esophagus. No enlarged axillary, supraclavicular, mediastinal, or hilar lymph nodes. Lungs/Pleura: There are coarsened interstitial markings identified bilaterally, likely the sequelae of prior inflammation/infection. Mosaic attenuation is noted within the upper lung zones with areas of air trapping,  likely reflecting small airways disease. Scar like density with surrounding architectural distortion is identified within the posterior left lower lobe, image 105/3, which likely accounts for the chest radiograph abnormality. Subpleural reticulation and scarring is also noted within the posterior right lung base. There is a calcified granuloma noted within the posterior right base, image 112/3. No suspicious pulmonary nodules to suggest metastatic disease Upper Abdomen: No acute abnormality identified within the imaged portions of the upper abdomen. Previous cholecystectomy. Scarring is noted involving the left kidney. Musculoskeletal: Postoperative changes from ORIF of the right proximal humerus noted. No acute or suspicious osseous abnormality. Small bone island noted within the upper thoracic spine measuring 5 mm. No signs of chest wall mass identified. IMPRESSION: 1. No findings to suggest metastatic disease to the chest. 2. Bilateral lower lobe areas of scarring and architectural distortion are identified. Findings are favored to represent sequelae of inflammation/infection. 3. Mosaic attenuation pattern identified within the upper lung zones likely reflecting small airways disease. 4. Aortic atherosclerosis. Coronary artery calcifications noted. Aortic Atherosclerosis (ICD10-I70.0). Electronically Signed   By: TKerby MoorsM.D.   On: 03/06/2020 13:08   CARDIAC CATHETERIZATION  Result Date: 03/07/2020  Mid LAD lesion is 90% stenosed.  Prox RCA lesion is 20% stenosed.  Dist LAD lesion is 40% stenosed.  A drug-eluting stent was successfully placed.  Post intervention, there is a 0% residual stenosis.  A drug-eluting stent was successfully placed.  1.  One-vessel coronary artery disease with 90% stenosis mid LAD 2.  Normal left ventricular function 3.  Successful PCI, with overlapping DES mid LAD Recommendations 1.  Dual antiplatelet therapy uninterrupted for 1 year 2.  Aggressive risk factor  modification 3.  Likely discharge home in a.m.   ECHOCARDIOGRAM COMPLETE  Result Date: 03/07/2020    ECHOCARDIOGRAM REPORT   Patient Name:   Amanda TOSTENSONDate of Exam: 03/06/2020 Medical Rec #:  [id number]     Height:       65.0 in Accession #:    [id number]    Weight:       220.2 lb Date of Birth:  1November 18, 1957    BSA:          2.061 m Patient Age:    89years       BP:           104/57 mmHg Patient Gender: F              HR:  63 bpm. Exam Location:  [facility] Procedure: 2D Echo, Cardiac Doppler and Color Doppler Indications:     NSTEMI I21.4  History:         Patient has prior history of Echocardiogram examinations. Risk                  Factors:Hypertension and Diabetes.  Sonographer:     Alyse Low Roar Referring Phys:  [id number] [place] Diagnosing Phys: Bartholome Bill MD IMPRESSIONS  1. Left ventricular ejection fraction, by estimation, is 55 to 60%. The left ventricle has normal function. The left ventricle has no regional wall motion abnormalities. Left ventricular diastolic parameters were normal.  2. Right ventricular systolic function is normal. The right ventricular size is normal.  3. Left atrial size was mildly dilated.  4. The mitral valve is grossly normal. Trivial mitral valve regurgitation.  5. The aortic valve is grossly normal. Aortic valve regurgitation is not visualized. FINDINGS  Left Ventricle: Left ventricular ejection fraction, by estimation, is 55 to 60%. The left ventricle has normal function. The left ventricle has no regional wall motion abnormalities. The left ventricular internal cavity size was normal in size. There is  borderline left ventricular hypertrophy. Left ventricular diastolic parameters were normal. Right Ventricle: The right ventricular size is normal. No increase in right ventricular wall thickness. Right ventricular systolic function is normal. Left Atrium: Left atrial size was mildly dilated. Right Atrium: Right atrial size was normal in size. Pericardium:  There is no evidence of pericardial effusion. Mitral Valve: The mitral valve is grossly normal. Trivial mitral valve regurgitation. Tricuspid Valve: The tricuspid valve is grossly normal. Tricuspid valve regurgitation is trivial. Aortic Valve: The aortic valve is grossly normal. Aortic valve regurgitation is not visualized. Aortic valve peak gradient measures 7.8 mmHg. Pulmonic Valve: The pulmonic valve was not well visualized. Pulmonic valve regurgitation is trivial. Aorta: The aortic root is normal in size and structure. IAS/Shunts: The atrial septum is grossly normal.  LEFT VENTRICLE PLAX 2D LVIDd:         4.71 cm Diastology LVIDs:         3.26 cm LV e' medial:    5.98 cm/s LV PW:         0.93 cm LV E/e' medial:  16.0 LV IVS:        1.12 cm LV e' lateral:   9.36 cm/s                        LV E/e' lateral: 10.2  RIGHT VENTRICLE RV Mid diam:    2.76 cm RV S prime:     11.70 cm/s TAPSE (M-mode): 1.9 cm LEFT ATRIUM             Index       RIGHT ATRIUM           Index LA diam:        3.90 cm 1.89 cm/m  RA Area:     12.80 cm LA Vol (A2C):   62.6 ml 30.38 ml/m RA Volume:   29.90 ml  14.51 ml/m LA Vol (A4C):   47.0 ml 22.81 ml/m LA Biplane Vol: 54.4 ml 26.40 ml/m  AORTIC VALVE              PULMONIC VALVE AV Vmax:      140.00 cm/s PV Vmax:        0.92 m/s AV Peak Grad: 7.8 mmHg    PV Peak grad:  3.4 mmHg LVOT Vmax:    104.00 cm/s RVOT Peak grad: 2 mmHg  AORTA Ao Root diam: 2.70 cm MITRAL VALVE MV Area (PHT): 3.08 cm MV Decel Time: 246 msec MV E velocity: 95.50 cm/s MV A velocity: 77.10 cm/s MV E/A ratio:  1.24 MV A Prime:    8.2 cm/s Bartholome Bill MD Electronically signed by Bartholome Bill MD Signature Date/Time: 03/07/2020/7:30:49 AM    Final     Microbiology: Recent Results (from the past 240 hour(s))  Resp Panel by RT-PCR (Flu A&B, Covid) Nasopharyngeal Swab     Status: None   Collection Time: 03/06/20 12:34 AM   Specimen: Nasopharyngeal Swab; Nasopharyngeal(NP) swabs in vial transport medium  Result  Value Ref Range Status   SARS Coronavirus 2 by RT PCR NEGATIVE NEGATIVE Final    Comment: (NOTE) SARS-CoV-2 target nucleic acids are NOT DETECTED.  The SARS-CoV-2 RNA is generally detectable in upper respiratory specimens during the acute phase of infection. The lowest concentration of SARS-CoV-2 viral copies this assay can detect is 138 copies/mL. A negative result does not preclude SARS-Cov-2 infection and should not be used as the sole basis for treatment or other patient management decisions. A negative result may occur with  improper specimen collection/handling, submission of specimen other than nasopharyngeal swab, presence of viral mutation(s) within the areas targeted by this assay, and inadequate number of viral copies(<138 copies/mL). A negative result must be combined with clinical observations, patient history, and epidemiological information. The expected result is Negative.  Fact Sheet for Patients:  EntrepreneurPulse.com.au  Fact Sheet for Healthcare Providers:  IncredibleEmployment.be  This test is no t yet approved or cleared by the [place] FDA and  has been authorized for detection and/or diagnosis of SARS-CoV-2 by FDA under an Emergency Use Authorization (EUA). This EUA will remain  in effect (meaning this test can be used) for the duration of the COVID-19 declaration under Section 564(b)(1) of the Act, 21 U.S.C.section 360bbb-3(b)(1), unless the authorization is terminated  or revoked sooner.       Influenza A by PCR NEGATIVE NEGATIVE Final   Influenza B by PCR NEGATIVE NEGATIVE Final    Comment: (NOTE) The Xpert Xpress SARS-CoV-2/FLU/RSV plus assay is intended as an aid in the diagnosis of influenza from Nasopharyngeal swab specimens and should not be used as a sole basis for treatment. Nasal washings and aspirates are unacceptable for Xpert Xpress SARS-CoV-2/FLU/RSV testing.  Fact Sheet for  Patients: EntrepreneurPulse.com.au  Fact Sheet for Healthcare Providers: IncredibleEmployment.be  This test is not yet approved or cleared by the [place] FDA and has been authorized for detection and/or diagnosis of SARS-CoV-2 by FDA under an Emergency Use Authorization (EUA). This EUA will remain in effect (meaning this test can be used) for the duration of the COVID-19 declaration under Section 564(b)(1) of the Act, 21 U.S.C. section 360bbb-3(b)(1), unless the authorization is terminated or revoked.  Performed at [facility], [address] [place], [place] [address]   Culture, blood (routine x 2)     Status: Abnormal   Collection Time: 03/06/20 12:35 AM   Specimen: BLOOD  Result Value Ref Range Status   Specimen Description   Final    BLOOD RIGHT ANTECUBITAL Performed at [facility], [address] [place], [place] [address]    Special Requests   Final    BOTTLES DRAWN AEROBIC AND ANAEROBIC Blood Culture adequate volume Performed at [facility], [address] [place], [place] [address]    Culture  Setup Time   Final    Organism ID to follow AEROBIC BOTTLE ONLY GRAM POSITIVE COCCI CRITICAL RESULT CALLED TO, READ BACK BY AND VERIFIED WITH: ALEX CHAPPELL 03/06/20 AT 1825 BY ACR Performed at [facility], [address] [place], [place] [address]    Culture (A)  Final    STAPHYLOCOCCUS HOMINIS THE SIGNIFICANCE OF ISOLATING THIS ORGANISM FROM A SINGLE SET OF BLOOD CULTURES WHEN MULTIPLE SETS ARE DRAWN IS UNCERTAIN. PLEASE NOTIFY THE MICROBIOLOGY DEPARTMENT WITHIN ONE WEEK IF SPECIATION AND SENSITIVITIES ARE REQUIRED. Performed at [facility], [place] [address] [place], [place] [address]    Report Status 03/07/2020 FINAL  Final  Blood Culture ID Panel (Reflexed)     Status: Abnormal   Collection Time: 03/06/20 12:35 AM  Result Value Ref Range Status   Enterococcus faecalis NOT  DETECTED NOT DETECTED Final   Enterococcus Faecium NOT DETECTED NOT DETECTED Final   Listeria monocytogenes NOT DETECTED NOT DETECTED Final   Staphylococcus species DETECTED (A) NOT DETECTED Final    Comment: CRITICAL RESULT CALLED TO, READ BACK BY AND VERIFIED WITH: ALEX CHAPPELL 03/06/20 AT 1825 BY ACR    Staphylococcus aureus (BCID) NOT DETECTED NOT DETECTED Final   Staphylococcus epidermidis NOT DETECTED NOT DETECTED Final   Staphylococcus lugdunensis NOT DETECTED NOT DETECTED Final   Streptococcus species NOT DETECTED NOT DETECTED Final   Streptococcus agalactiae NOT DETECTED NOT DETECTED Final   Streptococcus pneumoniae NOT DETECTED NOT DETECTED Final   Streptococcus pyogenes NOT DETECTED NOT DETECTED Final   A.calcoaceticus-baumannii NOT DETECTED NOT DETECTED Final   Bacteroides fragilis NOT DETECTED NOT DETECTED Final   Enterobacterales NOT DETECTED NOT DETECTED Final   Enterobacter cloacae complex NOT DETECTED NOT DETECTED Final   Escherichia coli NOT DETECTED NOT DETECTED Final   Klebsiella aerogenes NOT DETECTED NOT DETECTED Final   Klebsiella oxytoca NOT DETECTED NOT DETECTED Final   Klebsiella pneumoniae NOT DETECTED NOT DETECTED Final   Proteus species NOT DETECTED NOT DETECTED Final   Salmonella species NOT DETECTED NOT DETECTED Final   Serratia marcescens NOT DETECTED NOT DETECTED Final   Haemophilus influenzae NOT DETECTED NOT DETECTED Final   Neisseria meningitidis NOT DETECTED NOT DETECTED Final   Pseudomonas aeruginosa NOT DETECTED NOT DETECTED Final   Stenotrophomonas maltophilia NOT DETECTED NOT DETECTED Final   Candida albicans NOT DETECTED NOT DETECTED Final   Candida auris NOT DETECTED NOT DETECTED Final   Candida glabrata NOT DETECTED NOT DETECTED Final   Candida krusei NOT DETECTED NOT DETECTED Final   Candida parapsilosis NOT DETECTED NOT DETECTED Final   Candida tropicalis NOT DETECTED NOT DETECTED Final   Cryptococcus neoformans/gattii NOT DETECTED NOT  DETECTED Final    Comment: Performed at [facility], [address] [place], [place] [address]  Culture, blood (routine x 2)     Status: None (Preliminary result)   Collection Time: 03/06/20  4:35 AM   Specimen: BLOOD  Result Value Ref Range Status   Specimen Description BLOOD BLOOD RIGHT FOREARM  Final   Special Requests   Final    BOTTLES DRAWN AEROBIC AND ANAEROBIC Blood Culture adequate volume   Culture   Final    NO GROWTH 2 DAYS Performed at [facility], [address] [place], [place] [address]    Report Status PENDING  Incomplete     Labs: Basic Metabolic Panel: Recent Labs  Lab 03/05/20 2237 03/06/20 0435 03/07/20 0644 03/08/20 0526  NA 142 139 141 143  K 3.9  3.5 3.3* 4.1  CL 109 105 109 109  CO2 19* 22 22 24   GLUCOSE 211* 146* 165* 149*  BUN 13 11 15 18   CREATININE 0.96 0.79 0.83 0.83  CALCIUM 9.5 9.2 8.5* 8.8*   Liver Function Tests: Recent Labs  Lab 03/07/20 0644 03/08/20 0526  AST 26 17  ALT 23 23  ALKPHOS 51 48  BILITOT 0.8 0.5  PROT 6.2* 6.6  ALBUMIN 3.2* 3.3*   No results for input(s): LIPASE, AMYLASE in the last 168 hours. No results for input(s): AMMONIA in the last 168 hours. CBC: Recent Labs  Lab 03/05/20 2237 03/06/20 0435 03/07/20 0644 03/08/20 0526  WBC 9.8 9.9 9.4 10.1  HGB 15.6* 14.7 13.9 13.7  HCT 47.6* 44.1 42.7 42.1  MCV 85.6 85.8 86.4 86.6  PLT 216 212 184 213   Cardiac Enzymes: No results for input(s): CKTOTAL, CKMB, CKMBINDEX, TROPONINI in the last 168 hours. BNP: BNP (last 3 results) No results for input(s): BNP in the last 8760 hours.  ProBNP (last 3 results) No results for input(s): PROBNP in the last 8760 hours.  CBG: Recent Labs  Lab 03/07/20 0931 03/07/20 1313 03/07/20 1637 03/07/20 2054 03/08/20 0825  GLUCAP 202* 272* 272* 316* 156*       Signed:  Nita Sells MD   [facility] 03/08/2020, 9:38 AM

## 2020-03-08 NOTE — Plan of Care (Signed)
  Problem: Education: Goal: Knowledge of General Education information will improve Description: Including pain rating scale, medication(s)/side effects and non-pharmacologic comfort measures Outcome: Progressing   Problem: Health Behavior/Discharge Planning: Goal: Ability to manage health-related needs will improve Outcome: Progressing   Problem: Clinical Measurements: Goal: Ability to maintain clinical measurements within normal limits will improve Outcome: Progressing Goal: Will remain free from infection Outcome: Progressing Goal: Diagnostic test results will improve Outcome: Progressing Goal: Respiratory complications will improve Outcome: Progressing Goal: Cardiovascular complication will be avoided Outcome: Progressing   Problem: Activity: Goal: Risk for activity intolerance will decrease Outcome: Progressing   Problem: Nutrition: Goal: Adequate nutrition will be maintained Outcome: Progressing   Problem: Coping: Goal: Level of anxiety will decrease Outcome: Progressing   Problem: Elimination: Goal: Will not experience complications related to bowel motility Outcome: Progressing Goal: Will not experience complications related to urinary retention Outcome: Progressing   Problem: Pain Managment: Goal: General experience of comfort will improve Outcome: Progressing   Problem: Safety: Goal: Ability to remain free from injury will improve Outcome: Progressing   Problem: Skin Integrity: Goal: Risk for impaired skin integrity will decrease Outcome: Progressing   Problem: Education: Goal: Understanding of cardiac disease, CV risk reduction, and recovery process will improve Outcome: Progressing   Problem: Activity: Goal: Ability to tolerate increased activity will improve Outcome: Progressing   Problem: Cardiac: Goal: Ability to achieve and maintain adequate cardiovascular perfusion will improve Outcome: Progressing   Problem: Health Behavior/Discharge  Planning: Goal: Ability to safely manage health-related needs after discharge will improve Outcome: Progressing   Problem: Education: Goal: Understanding of CV disease, CV risk reduction, and recovery process will improve Outcome: Progressing   Problem: Activity: Goal: Ability to return to baseline activity level will improve Outcome: Progressing   Problem: Cardiovascular: Goal: Ability to achieve and maintain adequate cardiovascular perfusion will improve Outcome: Progressing   Problem: Health Behavior/Discharge Planning: Goal: Ability to safely manage health-related needs after discharge will improve Outcome: Progressing

## 2020-03-09 ENCOUNTER — Other Ambulatory Visit: Payer: Self-pay

## 2020-03-09 ENCOUNTER — Inpatient Hospital Stay
Admission: EM | Admit: 2020-03-09 | Discharge: 2020-03-11 | DRG: 281 | Disposition: A | Discharge status: Home / Self Care | Payer: Medicare Other | Attending: Internal Medicine | Admitting: Internal Medicine

## 2020-03-09 ENCOUNTER — Telehealth: Payer: Self-pay

## 2020-03-09 ENCOUNTER — Emergency Department: Payer: Medicare Other

## 2020-03-09 ENCOUNTER — Encounter: Payer: Self-pay | Admitting: *Deleted

## 2020-03-09 DIAGNOSIS — R0789 Other chest pain: Secondary | ICD-10-CM

## 2020-03-09 DIAGNOSIS — Z83438 Family history of other disorder of lipoprotein metabolism and other lipidemia: Secondary | ICD-10-CM

## 2020-03-09 DIAGNOSIS — I2510 Atherosclerotic heart disease of native coronary artery without angina pectoris: Secondary | ICD-10-CM | POA: Diagnosis present

## 2020-03-09 DIAGNOSIS — Z90710 Acquired absence of both cervix and uterus: Secondary | ICD-10-CM

## 2020-03-09 DIAGNOSIS — Z87891 Personal history of nicotine dependence: Secondary | ICD-10-CM

## 2020-03-09 DIAGNOSIS — Z825 Family history of asthma and other chronic lower respiratory diseases: Secondary | ICD-10-CM

## 2020-03-09 DIAGNOSIS — K219 Gastro-esophageal reflux disease without esophagitis: Secondary | ICD-10-CM | POA: Diagnosis present

## 2020-03-09 DIAGNOSIS — E119 Type 2 diabetes mellitus without complications: Secondary | ICD-10-CM | POA: Diagnosis present

## 2020-03-09 DIAGNOSIS — Z91041 Radiographic dye allergy status: Secondary | ICD-10-CM

## 2020-03-09 DIAGNOSIS — Z87442 Personal history of urinary calculi: Secondary | ICD-10-CM

## 2020-03-09 DIAGNOSIS — J449 Chronic obstructive pulmonary disease, unspecified: Secondary | ICD-10-CM | POA: Diagnosis present

## 2020-03-09 DIAGNOSIS — Z7951 Long term (current) use of inhaled steroids: Secondary | ICD-10-CM

## 2020-03-09 DIAGNOSIS — Z955 Presence of coronary angioplasty implant and graft: Secondary | ICD-10-CM

## 2020-03-09 DIAGNOSIS — Z7984 Long term (current) use of oral hypoglycemic drugs: Secondary | ICD-10-CM

## 2020-03-09 DIAGNOSIS — E7800 Pure hypercholesterolemia, unspecified: Secondary | ICD-10-CM | POA: Diagnosis present

## 2020-03-09 DIAGNOSIS — I25119 Atherosclerotic heart disease of native coronary artery with unspecified angina pectoris: Secondary | ICD-10-CM | POA: Diagnosis present

## 2020-03-09 DIAGNOSIS — E785 Hyperlipidemia, unspecified: Secondary | ICD-10-CM | POA: Diagnosis present

## 2020-03-09 DIAGNOSIS — Z79899 Other long term (current) drug therapy: Secondary | ICD-10-CM

## 2020-03-09 DIAGNOSIS — I25110 Atherosclerotic heart disease of native coronary artery with unstable angina pectoris: Secondary | ICD-10-CM | POA: Diagnosis present

## 2020-03-09 DIAGNOSIS — Z888 Allergy status to other drugs, medicaments and biological substances status: Secondary | ICD-10-CM

## 2020-03-09 DIAGNOSIS — I214 Non-ST elevation (NSTEMI) myocardial infarction: Principal | ICD-10-CM | POA: Diagnosis present

## 2020-03-09 DIAGNOSIS — Z7982 Long term (current) use of aspirin: Secondary | ICD-10-CM

## 2020-03-09 DIAGNOSIS — Z20822 Contact with and (suspected) exposure to covid-19: Secondary | ICD-10-CM | POA: Diagnosis present

## 2020-03-09 DIAGNOSIS — Z8249 Family history of ischemic heart disease and other diseases of the circulatory system: Secondary | ICD-10-CM

## 2020-03-09 DIAGNOSIS — E669 Obesity, unspecified: Secondary | ICD-10-CM | POA: Diagnosis present

## 2020-03-09 DIAGNOSIS — I25118 Atherosclerotic heart disease of native coronary artery with other forms of angina pectoris: Secondary | ICD-10-CM | POA: Diagnosis present

## 2020-03-09 DIAGNOSIS — Z882 Allergy status to sulfonamides status: Secondary | ICD-10-CM

## 2020-03-09 DIAGNOSIS — I10 Essential (primary) hypertension: Secondary | ICD-10-CM | POA: Diagnosis present

## 2020-03-09 DIAGNOSIS — I237 Postinfarction angina: Secondary | ICD-10-CM | POA: Diagnosis present

## 2020-03-09 DIAGNOSIS — R079 Chest pain, unspecified: Secondary | ICD-10-CM

## 2020-03-09 DIAGNOSIS — F319 Bipolar disorder, unspecified: Secondary | ICD-10-CM | POA: Diagnosis present

## 2020-03-09 DIAGNOSIS — Z8616 Personal history of COVID-19: Secondary | ICD-10-CM

## 2020-03-09 DIAGNOSIS — K5090 Crohn's disease, unspecified, without complications: Secondary | ICD-10-CM | POA: Diagnosis present

## 2020-03-09 LAB — CBC
HCT: 43.6 % (ref 36.0–46.0)
Hemoglobin: 14.3 g/dL (ref 12.0–15.0)
MCH: 28.8 pg (ref 26.0–34.0)
MCHC: 32.8 g/dL (ref 30.0–36.0)
MCV: 87.7 fL (ref 80.0–100.0)
Platelets: 178 10*3/uL (ref 150–400)
RBC: 4.97 MIL/uL (ref 3.87–5.11)
RDW: 16 % — ABNORMAL HIGH (ref 11.5–15.5)
WBC: 10.2 10*3/uL (ref 4.0–10.5)
nRBC: 0 % (ref 0.0–0.2)

## 2020-03-09 LAB — BASIC METABOLIC PANEL
Anion gap: 11 (ref 5–15)
BUN: 13 mg/dL (ref 8–23)
CO2: 24 mmol/L (ref 22–32)
Calcium: 8.8 mg/dL — ABNORMAL LOW (ref 8.9–10.3)
Chloride: 106 mmol/L (ref 98–111)
Creatinine, Ser: 0.88 mg/dL (ref 0.44–1.00)
GFR, Estimated: >60 mL/min (ref >60)
Glucose, Bld: 224 mg/dL — ABNORMAL HIGH (ref 70–99)
Potassium: 3.9 mmol/L (ref 3.5–5.1)
Sodium: 141 mmol/L (ref 135–145)

## 2020-03-09 LAB — TROPONIN I (HIGH SENSITIVITY): Troponin I (High Sensitivity): 437 ng/L (ref <18)

## 2020-03-09 MED ORDER — MORPHINE SULFATE (PF) 2 MG/ML IV SOLN
2.0000 mg | Freq: Once | INTRAVENOUS | Status: AC
  Administered 2020-03-09: 2 mg via INTRAVENOUS
  Filled 2020-03-09: qty 1

## 2020-03-09 MED ORDER — ONDANSETRON HCL 4 MG/2ML IJ SOLN
4.0000 mg | Freq: Once | INTRAMUSCULAR | Status: AC
  Administered 2020-03-09: 4 mg via INTRAVENOUS
  Filled 2020-03-09: qty 2

## 2020-03-09 MED ORDER — FAMOTIDINE 20 MG PO TABS
20.0000 mg | ORAL_TABLET | Freq: Once | ORAL | Status: AC
  Administered 2020-03-09: 20 mg via ORAL
  Filled 2020-03-09: qty 1

## 2020-03-09 MED ORDER — ALUM & MAG HYDROXIDE-SIMETH 200-200-20 MG/5ML PO SUSP
15.0000 mL | Freq: Once | ORAL | Status: AC
  Administered 2020-03-09: 15 mL via ORAL
  Filled 2020-03-09: qty 30

## 2020-03-09 NOTE — H&P (Signed)
History and Physical    Amanda Wiley FEO:[id number] DOB: Oct 02, 1955 DOA: 03/09/2020  PCP: Einar Pheasant, MD   Patient coming from: Home  I have personally briefly reviewed patient's old medical records in [place]  Chief Complaint: Chest pain  HPI: Amanda Wiley is a 64 y.o. female with medical history significant for diabetes mellitus type 2, Crohn's disease, hypertension, coronary artery disease status post recent hospitalization for non-ST elevation MI and is status post stent angioplasty involving the mid LAD.  Patient was discharged home on 11/30 in stable condition and presents to the emergency room 1 day later for evaluation of midsternal pain which started acutely at rest with radiation to her right shoulder shoulder.  She had nausea but denies having any vomiting and thinks this pain may be related to stress.  She also states that this pain is different from when she had a non-ST elevation MI a couple of days ago.  Her chest pain improved following administration of morphine and is down to a 4 x 10 in intensity. She denies having any abdominal pain, no changes in her bowel habits, no fever, no chills, no cough, no dizziness, no lightheadedness. Labs show sodium 141, potassium 3.9, chloride 106, bicarb 24, glucose 224, BUN 13, creatinine 0.8, calcium 8.8, troponin IV 37 down from 700 about 2 days ago, white count 10.2, hemoglobin 14.3, hematocrit 43.6, MCV 87.7, RDW 16, platelet count 178 Chest x-ray reviewed by me shows no active disease Twelve-lead EKG reviewed by me shows normal sinus rhythm   ED Course: Patient is a 64 year old female who is status post recent stent angioplasty to the mid LAD and presents for evaluation of midsternal chest pain associated with nausea.  Per patient this pain feels different than when she had her acute coronary event.  Troponin is elevated at 400 but improved from 2 days ago.  She will be referred to observation status for further  evaluation.  Review of Systems: As per HPI otherwise 10 point review of systems negative.    Past Medical History:  Diagnosis Date  . Arthritis   . Asthma   . Complication of anesthesia    HARD TO WAKE UP  . COPD (chronic obstructive pulmonary disease) ([place])   . Crohn's disease ([place])   . Depression   . Diabetes mellitus ([place])   . Fracture, foot    post fall  . Hypercholesterolemia   . Hypertension   . IBS (irritable bowel syndrome)   . Nephrolithiasis    s/p lithotripsy [facility])  . Vitamin D deficiency     Past Surgical History:  Procedure Laterality Date  . ABDOMINAL HYSTERECTOMY  1985  . APPENDECTOMY  1985  . BREAST DUCTAL SYSTEM EXCISION Left 09/04/2019   Procedure: EXCISION DUCTAL SYSTEM BREAST;  Surgeon: Robert Bellow, MD;  Location: [facility] ORS;  Service: General;  Laterality: Left;  . BREAST EXCISIONAL BIOPSY Right 2013   benign  . COLONOSCOPY  2014  . COLONOSCOPY WITH PROPOFOL N/A 02/10/2020   Procedure: COLONOSCOPY WITH PROPOFOL;  Surgeon: Robert Bellow, MD;  Location: [facility] ENDOSCOPY;  Service: Endoscopy;  Laterality: N/A;  . CORONARY STENT INTERVENTION N/A 03/07/2020   Procedure: CORONARY STENT INTERVENTION;  Surgeon: Isaias Cowman, MD;  Location: [place] [facility];  Service: Cardiovascular;  Laterality: N/A;  . EXCISION / BIOPSY BREAST / NIPPLE / DUCT Left 2019   papilloma  . EXCISION OF BREAST BIOPSY Left 2019   papilloma  . LAPAROSCOPIC CHOLECYSTECTOMY  2005  Dr Tamala Julian  . LAPAROSCOPIC SUPRACERVICAL HYSTERECTOMY  1985   left ovary not removed  . LEFT HEART CATH AND CORONARY ANGIOGRAPHY N/A 03/07/2020   Procedure: LEFT HEART CATH AND CORONARY ANGIOGRAPHY;  Surgeon: Isaias Cowman, MD;  Location: [place] [facility];  Service: Cardiovascular;  Laterality: N/A;  . LITHOTRIPSY     x7  . OOPHORECTOMY    . POLYPECTOMY    . SHOULDER SURGERY     right (pinning)  . SHOULDER SURGERY     left (pinning)  . TONSILLECTOMY   1966     reports that she quit smoking about 22 years ago. She has a 30.00 pack-year smoking history. She has never used smokeless tobacco. She reports that she does not drink alcohol and does not use drugs.  Allergies  Allergen Reactions  . Avelox [Moxifloxacin Hcl In Nacl] Hives, Shortness Of Breath and Other (See Comments)  . Moxifloxacin Hives and Shortness Of Breath  . Allegra [Fexofenadine Hcl] Other (See Comments)    headaches  . Ibuprofen Swelling    FACIAL  . Sulfa Antibiotics Other (See Comments)    Difficulty breathing, rash, tongue swelling  . Tegretol [Carbamazepine] Other (See Comments)    Other reaction(s): Other (See Comments) Vomiting Vomiting   . Iodine Rash  . Ioxaglate Rash  . Ivp Dye [Iodinated Diagnostic Agents] Rash    Family History  Problem Relation Age of Onset  . Heart disease Father   . Emphysema Maternal Grandmother   . COPD Mother   . Hypercholesterolemia Mother   . Emphysema Mother   . Hypertension Brother   . Emphysema Other        grandmother  . COPD Sister   . Crohn's disease Sister   . Hypertension Sister   . Breast cancer Neg Hx   . Colon cancer Neg Hx      Prior to Admission medications   Medication Sig Start Date End Date Taking? Authorizing Provider  acetaminophen (TYLENOL) 325 MG tablet Take 2 tablets (650 mg total) by mouth every 6 (six) hours as needed for mild pain (or Fever >/= 101). 02/03/19   Flora Lipps, MD  albuterol (PROVENTIL) (2.5 MG/3ML) 0.083% nebulizer solution Take 2.5 mg by nebulization every 6 (six) hours as needed for wheezing.    [provider]  aspirin 81 MG chewable tablet Chew 1 tablet (81 mg total) by mouth daily. 03/08/20   Avie Arenas, PA-C  buPROPion (WELLBUTRIN XL) 300 MG 24 hr tablet Take 1 tablet (300 mg total) by mouth daily. 01/06/20   Einar Pheasant, MD  Dulaglutide (TRULICITY) 1.5 ZD/6.3OV SOPN Inject 1.5 mg into the skin once a week. ON SUNDAYS    [provider]   fluticasone (FLONASE) 50 MCG/ACT nasal spray Place 1 spray into both nostrils daily. 03/08/20   Nita Sells, MD  fluticasone-salmeterol (ADVAIR HFA) 230-21 MCG/ACT inhaler Inhale 2 puffs into the lungs 2 (two) times daily.    [provider]  hyoscyamine (LEVSIN SL) 0.125 MG SL tablet Place 0.125 mg under the tongue as needed.    [provider]  insulin glargine (LANTUS) 100 UNIT/ML injection Inject 20 Units into the skin at bedtime.     [provider]  ipratropium (ATROVENT) 0.06 % nasal spray Place 2 sprays into both nostrils 3 (three) times daily as needed for rhinitis.     [provider]  Ipratropium-Albuterol (COMBIVENT) 20-100 MCG/ACT AERS respimat Inhale 2 puffs into the lungs every 4 (four) hours while awake. Patient  taking differently: Inhale 1 puff into the lungs 2 (two) times daily. And as needed. 02/16/19   Einar Pheasant, MD  JARDIANCE 25 MG TABS tablet Take 25 mg by mouth daily. 04/24/19   [provider]  LORazepam (ATIVAN) 0.5 MG tablet Take 1/2 tablet prior to MRI.  May repeat x 1 prior to MRI if needed. 08/10/19   Einar Pheasant, MD  losartan (COZAAR) 25 MG tablet Take 1 tablet (25 mg total) by mouth daily. 03/08/20   Avie Arenas, PA-C  metFORMIN (GLUCOPHAGE) 500 MG tablet Take 2 tablets (1,000 mg total) by mouth 2 (two) times daily. 01/06/20   Einar Pheasant, MD  metoprolol tartrate (LOPRESSOR) 25 MG tablet Take 1 tablet (25 mg total) by mouth 2 (two) times daily. 01/06/20   Einar Pheasant, MD  prasugrel (EFFIENT) 10 MG TABS tablet Take 1 tablet (10 mg total) by mouth daily. 03/08/20   Avie Arenas, PA-C  RABEprazole (ACIPHEX) 20 MG tablet Take 1 tablet (20 mg total) by mouth in the morning and at bedtime. 01/06/20   Einar Pheasant, MD  rosuvastatin (CRESTOR) 5 MG tablet Take 1 tablet (5 mg total) by mouth daily. 03/08/20   Avie Arenas, PA-C  ULTRACARE PEN NEEDLES 32G X 4 MM MISC  02/10/19   [provider]  vitamin C (VITAMIN C) 1000 MG tablet Take 1 tablet (1,000 mg total) by mouth daily. 02/03/19   Flora Lipps, MD    Physical Exam: Vitals:   03/09/20 2113 03/09/20 2115 03/09/20 2145 03/09/20 2230  BP:  (!) 165/149 (!) 157/56 127/60  Pulse:  73 75 73  Resp:  16 17 17   Temp:  98.1 F (36.7 C)    TempSrc:  Oral    SpO2:  98% 97% 90%  Weight: 102 kg     Height: 5' 5"  (1.651 m)        Vitals:   03/09/20 2113 03/09/20 2115 03/09/20 2145 03/09/20 2230  BP:  (!) 165/149 (!) 157/56 127/60  Pulse:  73 75 73  Resp:  16 17 17   Temp:  98.1 F (36.7 C)    TempSrc:  Oral    SpO2:  98% 97% 90%  Weight: 102 kg     Height: 5' 5"  (1.651 m)       Constitutional: NAD, alert and oriented x 3.  Morbidly obese appears comfortable and in no distress Eyes: PERRL, lids and conjunctivae normal ENMT: Mucous membranes are moist.  Neck: normal, supple, no masses, no thyromegaly Respiratory: clear to auscultation bilaterally, no wheezing, no crackles. Normal respiratory effort. No accessory muscle use.  Cardiovascular: Regular rate and rhythm, no murmurs / rubs / gallops. No extremity edema. 2+ pedal pulses. No carotid bruits.  Abdomen: no tenderness, no masses palpated. No hepatosplenomegaly. Bowel sounds positive.  Central adiposity Musculoskeletal: no clubbing / cyanosis. No joint deformity upper and lower extremities.  Skin: no rashes, lesions, ulcers.  Neurologic: No gross focal neurologic deficit. Psychiatric: Normal mood and affect.   Labs on Admission: I have personally reviewed following labs and imaging studies  CBC: Recent Labs  Lab 03/05/20 2237 03/06/20 0435 03/07/20 0644 03/08/20 0526 03/09/20 2216  WBC 9.8 9.9 9.4 10.1 10.2  HGB 15.6* 14.7 13.9 13.7 14.3  HCT 47.6* 44.1 42.7 42.1 43.6  MCV 85.6 85.8 86.4 86.6 87.7  PLT 216 212 184 213 789   Basic Metabolic Panel: Recent Labs  Lab 03/05/20 2237 03/06/20 0435 03/07/20 0644 03/08/20 0526 03/09/20 2126  NA 142 139 141 143 141  K 3.9 3.5 3.3* 4.1 3.9  CL 109 105 109 109 106  CO2 19* 22 22 24 24   GLUCOSE 211* 146* 165* 149* 224*  BUN 13 11 15 18 13   CREATININE 0.96 0.79 0.83 0.83 0.88  CALCIUM 9.5 9.2 8.5* 8.8* 8.8*   GFR: Estimated Creatinine Clearance: 75.5 mL/min (by C-G formula based on SCr of 0.88 mg/dL). Liver Function Tests: Recent Labs  Lab 03/07/20 0644 03/08/20 0526  AST 26 17  ALT 23 23  ALKPHOS 51 48  BILITOT 0.8 0.5  PROT 6.2* 6.6  ALBUMIN 3.2* 3.3*   No results for input(s): LIPASE, AMYLASE in the last 168 hours. No results for input(s): AMMONIA in the last 168 hours. Coagulation Profile: Recent Labs  Lab 03/06/20 0034 03/06/20 0036  INR 0.9 0.9   Cardiac Enzymes: No results for input(s): CKTOTAL, CKMB, CKMBINDEX, TROPONINI in the last 168 hours. BNP (last 3 results) No results for input(s): PROBNP in the last 8760 hours. HbA1C: No results for input(s): HGBA1C in the last 72 hours. CBG: Recent Labs  Lab 03/07/20 0931 03/07/20 1313 03/07/20 1637 03/07/20 2054 03/08/20 0825  GLUCAP 202* 272* 272* 316* 156*   Lipid Profile: No results for input(s): CHOL, HDL, LDLCALC, TRIG, CHOLHDL, LDLDIRECT in the last 72 hours. Thyroid Function Tests: No results for input(s): TSH, T4TOTAL, FREET4, T3FREE, THYROIDAB in the last 72 hours. Anemia Panel: No results for input(s): VITAMINB12, FOLATE, FERRITIN, TIBC, IRON, RETICCTPCT in the last 72 hours. Urine analysis:    Component Value Date/Time   COLORURINE STRAW (A) 02/02/2019 1343   APPEARANCEUR CLEAR (A) 02/02/2019 1343   LABSPEC 1.013 02/02/2019 1343   PHURINE 5.0 02/02/2019 1343   GLUCOSEU >=500 (A) 02/02/2019 1343   GLUCOSEU 100 (A) 07/16/2013 1446   HGBUR NEGATIVE 02/02/2019 [place] 02/02/2019 1343   BILIRUBINUR neg 07/06/2015 1150   KETONESUR NEGATIVE 02/02/2019 1343   PROTEINUR NEGATIVE 02/02/2019 1343   UROBILINOGEN 0.2 07/06/2015 1150   UROBILINOGEN 0.2 07/16/2013 1446    NITRITE NEGATIVE 02/02/2019 1343   LEUKOCYTESUR NEGATIVE 02/02/2019 1343    Radiological Exams on Admission: DG Chest Portable 1 View  Result Date: 03/09/2020 CLINICAL DATA:  Status post cardiac catheterization, chest pain EXAM: PORTABLE CHEST 1 VIEW COMPARISON:  03/05/2020 FINDINGS: Lungs are clear. No pneumothorax or pleural effusion. Previously noted retrocardiac mass has resolved, likely artifactual on prior examination given absence of segment and findings on subsequent CT examination of 03/06/2020. Cardiac size within normal limits. Pulmonary vascularity is normal. IMPRESSION: No active disease. Electronically Signed   By: Fidela Salisbury MD   On: 03/09/2020 22:10    EKG: Independently reviewed.  Normal sinus rhythm  Assessment/Plan Principal Problem:   Chest pain Active Problems:   Diabetes mellitus (HCC)   Acid reflux   CD (Crohn's disease) (HCC)   CAD (coronary artery disease), native coronary artery       Chest pain In a patient with known coronary artery disease Status post prior cardiac cath in 2006 And recent stent angioplasty to mid LAD with drug-eluting stent Initial troponin is elevated but improved compared to her last hospitalization Cycle serial cardiac enzymes Continue aspirin, Effient, Crestor and metoprolol We will request cardiology consult    Diabetes mellitus Maintain consistent carbohydrate diet Continue scheduled long-acting insulin with sliding scale coverage Accu-Chek before meals and at bedtime   COPD/asthma Not acutely exacerbated Continue as needed bronchodilator therapy and inhaled steroids   Hypertension Blood  pressure is stable Continue losartan and metoprolol    Bipolar disorder Continue Wellbutrin and Xanax     DVT prophylaxis: Lovenox Code Status: Full code Family Communication: Greater than 50% of time was spent discussing patient's condition and plan of care with her and her husband at the bedside.  All questions and  concerns have been addressed.  They verbalized understanding and agree with the plan. Disposition Plan: Back to previous home environment Consults called: Cardiology Admission status: Observation    Lucile Hillmann MD [facility]     03/10/2020, 12:29 AM

## 2020-03-09 NOTE — ED Provider Notes (Signed)
-----------------------------------------   11:05 PM on 03/09/2020 -----------------------------------------  Assuming care from Dr. Jacqualine Code.  In short, Amanda Wiley is a 64 y.o. female with a chief complaint of chest pain.  Refer to the original H&P for additional details.  The current plan of care is to admit the patient to the hospital after troponin results, given her recent NSTEMI with PCI and new somewhat different and atypical chest pain.  Reportedly Dr. Jacqualine Code has already discussed the case with Dr. Nehemiah Massed and we are just awaiting troponin which was unfortunately delayed in the lab and required recollection.   ----------------------------------------- 3:04 AM on 03/10/2020 -----------------------------------------  (Note that documentation was delayed due to multiple ED patients requiring immediate care.)  The patient's troponin was greater than 400 which is down from her most recent value.  Given the ongoing chest pain the plan from Dr. Jacqualine Code was to admit the patient.  I discussed in person with Dr. Francine Graven with the hospitalist service who will admit.    Hinda Kehr, MD 03/10/20 [phone]

## 2020-03-09 NOTE — Telephone Encounter (Signed)
Called patient for follow up with Transition of Care. Scheduled hfu with Cardiology 03/15/20. Declined follow up with PCP at this time. Agrees to call if needed and keep previously scheduled lab/OV 04/12/20 and 05/19/20 @ 2:00.

## 2020-03-09 NOTE — ED Provider Notes (Signed)
[facility] Emergency Department Provider Note   ____________________________________________   First MD Initiated Contact with Patient 03/09/20 2128     (approximate)  I have reviewed the triage vital signs and the nursing notes.   HISTORY  Chief Complaint Chest Pain and Back Pain    HPI STERLING UCCI is a 64 y.o. female history of COPD, prior Covid  Patient had a recent NSTEMI and cardiac stenting  Reports she left the hospital felt fine.  This evening around noon she started noticed a little bit of a burning sensation around her upper abdomen and chest.  Reports it felt like "acid reflux" but it seems to have steadily worsened over the course of the day.  Does radiate some discomfort up towards her right shoulder but she reports she has discomfort there on a frequent basis when she has stress  Reports this pain and symptoms feel nothing like what she had when she came into the hospital with her heart attack just a couple days ago  No shortness of breath.  Pain does not feel like it is ripping tearing or moving   She has been compliant with all of her discharge medications  Past Medical History:  Diagnosis Date  . Arthritis   . Asthma   . Complication of anesthesia    HARD TO WAKE UP  . COPD (chronic obstructive pulmonary disease) ([place])   . Crohn's disease ([place])   . Depression   . Diabetes mellitus ([place])   . Fracture, foot    post fall  . Hypercholesterolemia   . Hypertension   . IBS (irritable bowel syndrome)   . Nephrolithiasis    s/p lithotripsy [facility])  . Vitamin D deficiency     Patient Active Problem List   Diagnosis Date Noted  . NSTEMI (non-ST elevated myocardial infarction) ([place]) 03/06/2020  . Diabetes mellitus type 2, uncontrolled, with complications ([place]) 76/72/0947  . Hepatic steatosis 02/04/2019  . Acute respiratory disease due to COVID-19 virus 02/03/2019  . Pneumonia due to 2019-nCoV 01/12/2019  .  Abnormal liver function tests 12/24/2018  . Hepatomegaly 12/24/2018  . Cyst of left kidney 12/22/2018  . Sinusitis 06/21/2018  . Polycythemia vera ([place]) 04/04/2018  . 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 Nipple discharge 02/16/2018  . Health care maintenance 12/11/2014  . Mild depression ([place]) 12/09/2014  . Allergic drug reaction 12/09/2014  . Personal history of urinary calculi 12/09/2014  . Obesity, diabetes, and hypertension syndrome ([place]) 04/18/2014  . Asthma 03/04/2012  . Vitamin D deficiency 03/04/2012  . Crohn's disease ([place]) 03/04/2012  . GERD (gastroesophageal reflux disease) 03/04/2012  . Diabetes mellitus ([place]) 03/04/2012  . Hypertension 03/04/2012  . Acid reflux 03/04/2012  . Essential (primary) hypertension 03/04/2012  . Hypercholesterolemia without hypertriglyceridemia 03/04/2012  . CD (Crohn's disease) ([place]) 03/04/2012  . Adaptive colitis 01/30/2007    Past Surgical History:  Procedure Laterality Date  . ABDOMINAL HYSTERECTOMY  1985  . APPENDECTOMY  1985  . BREAST DUCTAL SYSTEM EXCISION Left 09/04/2019   Procedure: EXCISION DUCTAL SYSTEM BREAST;  Surgeon: Robert Bellow, MD;  Location: [facility] ORS;  Service: General;  Laterality: Left;  . BREAST EXCISIONAL BIOPSY Right 2013   benign  . COLONOSCOPY  2014  . COLONOSCOPY WITH PROPOFOL N/A 02/10/2020   Procedure: COLONOSCOPY WITH PROPOFOL;  Surgeon: Robert Bellow, MD;  Location: [facility] ENDOSCOPY;  Service: Endoscopy;  Laterality: N/A;  . CORONARY STENT INTERVENTION N/A 03/07/2020   Procedure: CORONARY STENT INTERVENTION;  Surgeon: Isaias Cowman, MD;  Location: [place] CV LAB;  Service: Cardiovascular;  Laterality: N/A;  . EXCISION / BIOPSY BREAST / NIPPLE / DUCT Left 2019   papilloma  . EXCISION OF BREAST BIOPSY Left 2019   papilloma  . LAPAROSCOPIC CHOLECYSTECTOMY  2005   Dr Tamala Julian  . LAPAROSCOPIC SUPRACERVICAL HYSTERECTOMY  1985   left ovary not removed  . LEFT HEART CATH AND CORONARY  ANGIOGRAPHY N/A 03/07/2020   Procedure: LEFT HEART CATH AND CORONARY ANGIOGRAPHY;  Surgeon: Isaias Cowman, MD;  Location: [place] CV LAB;  Service: Cardiovascular;  Laterality: N/A;  . LITHOTRIPSY     x7  . OOPHORECTOMY    . POLYPECTOMY    . SHOULDER SURGERY     right (pinning)  . SHOULDER SURGERY     left (pinning)  . TONSILLECTOMY  1966    Prior to Admission medications   Medication Sig Start Date End Date Taking? Authorizing Provider  acetaminophen (TYLENOL) 325 MG tablet Take 2 tablets (650 mg total) by mouth every 6 (six) hours as needed for mild pain (or Fever >/= 101). 02/03/19   Flora Lipps, MD  albuterol (PROVENTIL) (2.5 MG/3ML) 0.083% nebulizer solution Take 2.5 mg by nebulization every 6 (six) hours as needed for wheezing.    [provider]  aspirin 81 MG chewable tablet Chew 1 tablet (81 mg total) by mouth daily. 03/08/20   Avie Arenas, PA-C  buPROPion (WELLBUTRIN XL) 300 MG 24 hr tablet Take 1 tablet (300 mg total) by mouth daily. 01/06/20   Einar Pheasant, MD  Dulaglutide (TRULICITY) 1.5 EX/5.1ZG SOPN Inject 1.5 mg into the skin once a week. ON SUNDAYS    [provider]  fluticasone (FLONASE) 50 MCG/ACT nasal spray Place 1 spray into both nostrils daily. 03/08/20   Nita Sells, MD  fluticasone-salmeterol (ADVAIR HFA) 230-21 MCG/ACT inhaler Inhale 2 puffs into the lungs 2 (two) times daily.    [provider]  hyoscyamine (LEVSIN SL) 0.125 MG SL tablet Place 0.125 mg under the tongue as needed.    [provider]  insulin glargine (LANTUS) 100 UNIT/ML injection Inject 20 Units into the skin at bedtime.     [provider]  ipratropium (ATROVENT) 0.06 % nasal spray Place 2 sprays into both nostrils 3 (three) times daily as needed for rhinitis.     [provider]  Ipratropium-Albuterol (COMBIVENT) 20-100 MCG/ACT AERS respimat Inhale 2 puffs into the lungs every 4 (four) hours while  awake. Patient taking differently: Inhale 1 puff into the lungs 2 (two) times daily. And as needed. 02/16/19   Einar Pheasant, MD  JARDIANCE 25 MG TABS tablet Take 25 mg by mouth daily. 04/24/19   [provider]  LORazepam (ATIVAN) 0.5 MG tablet Take 1/2 tablet prior to MRI.  May repeat x 1 prior to MRI if needed. 08/10/19   Einar Pheasant, MD  losartan (COZAAR) 25 MG tablet Take 1 tablet (25 mg total) by mouth daily. 03/08/20   Avie Arenas, PA-C  metFORMIN (GLUCOPHAGE) 500 MG tablet Take 2 tablets (1,000 mg total) by mouth 2 (two) times daily. 01/06/20   Einar Pheasant, MD  metoprolol tartrate (LOPRESSOR) 25 MG tablet Take 1 tablet (25 mg total) by mouth 2 (two) times daily. 01/06/20   Einar Pheasant, MD  prasugrel (EFFIENT) 10 MG TABS tablet Take 1 tablet (10 mg total) by mouth daily. 03/08/20   Avie Arenas, PA-C  RABEprazole (ACIPHEX) 20 MG tablet Take 1 tablet (20 mg  total) by mouth in the morning and at bedtime. 01/06/20   Einar Pheasant, MD  rosuvastatin (CRESTOR) 5 MG tablet Take 1 tablet (5 mg total) by mouth daily. 03/08/20   Avie Arenas, PA-C  ULTRACARE PEN NEEDLES 32G X 4 MM MISC  02/10/19   [provider]  vitamin C (VITAMIN C) 1000 MG tablet Take 1 tablet (1,000 mg total) by mouth daily. 02/03/19   Flora Lipps, MD    Allergies Avelox [moxifloxacin hcl in nacl], Moxifloxacin, Allegra [fexofenadine hcl], Ibuprofen, Sulfa antibiotics, Tegretol [carbamazepine], Iodine, Ioxaglate, and Ivp dye [iodinated diagnostic agents]  Family History  Problem Relation Age of Onset  . Heart disease Father   . Emphysema Maternal Grandmother   . COPD Mother   . Hypercholesterolemia Mother   . Emphysema Mother   . Hypertension Brother   . Emphysema Other        grandmother  . COPD Sister   . Crohn's disease Sister   . Hypertension Sister   . Breast cancer Neg Hx   . Colon cancer Neg Hx     Social History Social History   Tobacco Use  . Smoking  status: Former Smoker    Packs/day: 2.00    Years: 15.00    Pack years: 30.00    Quit date: 04/09/1997    Years since quitting: 22.9  . Smokeless tobacco: Never Used  Vaping Use  . Vaping Use: Never used  Substance Use Topics  . Alcohol use: Never    Alcohol/week: 0.0 standard drinks  . Drug use: No    Review of Systems Constitutional: No fever/chills Eyes: No visual changes. ENT: No sore throat. Cardiovascular: See HPI Respiratory: Denies shortness of breath. Gastrointestinal: No abdominal pain.  Reports a bit of a "burning" feeling in her upper abdomen lower chest Genitourinary: Negative for dysuria. Musculoskeletal: Negative for back pain. Skin: Negative for rash. Neurological: Negative for headaches, areas of focal weakness or numbness.    ____________________________________________   PHYSICAL EXAM:  VITAL SIGNS: ED Triage Vitals  Enc Vitals Group     BP 03/09/20 2115 (!) 165/149     Pulse Rate 03/09/20 2115 73     Resp 03/09/20 2115 16     Temp 03/09/20 2115 98.1 F (36.7 C)     Temp Source 03/09/20 2115 Oral     SpO2 03/09/20 2115 98 %     Weight 03/09/20 2113 224 lb 13.9 oz (102 kg)     Height 03/09/20 2113 5' 5"  (1.651 m)     Head Circumference --      Peak Flow --      Pain Score 03/09/20 2113 5     Pain Loc --      Pain Edu? --      Excl. in [place]? --     Constitutional: Alert and oriented. Well appearing and in no acute distress. Eyes: Conjunctivae are normal. Head: Atraumatic. Nose: No congestion/rhinnorhea. Mouth/Throat: Mucous membranes are moist. Neck: No stridor.  Cardiovascular: Normal rate, regular rhythm. Grossly normal heart sounds.  Good peripheral circulation. Respiratory: Normal respiratory effort.  No retractions. Lungs CTAB. Gastrointestinal: Soft and nontender. No distention. Musculoskeletal: No lower extremity tenderness nor edema.  Strong right radial pulse.  Slight bruising.  No notable hematoma.  Denies pain in the right  arm. Neurologic:  Normal speech and language. No gross focal neurologic deficits are appreciated.  Skin:  Skin is warm, dry and intact. No rash noted. Psychiatric: Mood and affect are normal. Speech  and behavior are normal.  ____________________________________________   LABS (all labs ordered are listed, but only abnormal results are displayed)  Labs Reviewed  CBC - Abnormal; Notable for the following components:      Result Value   RDW 16.0 (*)    All other components within normal limits  BASIC METABOLIC PANEL  TROPONIN I (HIGH SENSITIVITY)  TROPONIN I (HIGH SENSITIVITY)   ____________________________________________  EKG  Reviewed inter by me at 2100 Heart rate 89 QRS 75 QTc 380 Normal sinus rhythm, no evidence of acute ischemia denoted. ____________________________________________  RADIOLOGY  Chest x-ray reviewed, no acute ____________________________________________   PROCEDURES  Procedure(s) performed: None  Procedures  Critical Care performed: No  ____________________________________________   INITIAL IMPRESSION / ASSESSMENT AND PLAN / ED COURSE  Pertinent labs & imaging results that were available during my care of the patient were reviewed by me and considered in my medical decision making (see chart for details).   Differential diagnosis includes, but is not limited to, ACS, aortic dissection, pulmonary embolism, cardiac tamponade, pneumothorax, pneumonia, pericarditis, myocarditis, GI-related causes including esophagitis/gastritis, and musculoskeletal chest wall pain.  Patient reports pain to be quite different than what she was admitted for.  Discussed with Dr. Nehemiah Massed of cardiology, he advises given the right radial procedure that acute dissection would be extremely unlikely and he does not feel that would be necessary work-up at this point.  He would consider possibilities of other etiologies that may be noncardiac as well as cardiac.  Recommends  treatment  symptomatically   Clinical Course as of Mar 10 2315  Wed Mar 09, 2020  2301 Patient resting comfortably, pain well controlled.  Understand agreeable with plan for admission.   [MQ]    Clinical Course User Index [MQ] Delman Kitten, MD    ----------------------------------------- 11:15 PM on 03/09/2020 -----------------------------------------  Ongoing ER care to sinus Dr. Karma Greaser.  Patient pending troponin for chest pain evaluation, will require admission for at minimum chest pain observation.  Discussed with cardiology, patient pain is improved, seems atypical of ACS, but given resent NSTEMI will need admission. Troponin PENDING ____________________________________________   FINAL CLINICAL IMPRESSION(S) / ED DIAGNOSES  Final diagnoses:  Atypical chest pain        Note:  This document was prepared using Dragon voice recognition software and may include unintentional dictation errors       Delman Kitten, MD 03/09/20 2316

## 2020-03-09 NOTE — ED Triage Notes (Signed)
Pt returning to ED reporting a new pain in the center of her back and chest. Pt was recently sent to Cath lab with two stents placed. Pt reports she was pain free upon discharge from the hospital. Lightheadedness but no dizziness. No SOB.

## 2020-03-10 ENCOUNTER — Encounter
Admission: EM | Disposition: A | Discharge status: Home / Self Care | Payer: Self-pay | Source: Home / Self Care | Attending: Internal Medicine

## 2020-03-10 ENCOUNTER — Encounter: Payer: Self-pay | Admitting: Internal Medicine

## 2020-03-10 DIAGNOSIS — Z882 Allergy status to sulfonamides status: Secondary | ICD-10-CM | POA: Diagnosis not present

## 2020-03-10 DIAGNOSIS — I10 Essential (primary) hypertension: Secondary | ICD-10-CM | POA: Diagnosis present

## 2020-03-10 DIAGNOSIS — I25110 Atherosclerotic heart disease of native coronary artery with unstable angina pectoris: Secondary | ICD-10-CM | POA: Diagnosis present

## 2020-03-10 DIAGNOSIS — R079 Chest pain, unspecified: Secondary | ICD-10-CM | POA: Diagnosis not present

## 2020-03-10 DIAGNOSIS — Z87442 Personal history of urinary calculi: Secondary | ICD-10-CM | POA: Diagnosis not present

## 2020-03-10 DIAGNOSIS — Z91041 Radiographic dye allergy status: Secondary | ICD-10-CM | POA: Diagnosis not present

## 2020-03-10 DIAGNOSIS — Z90710 Acquired absence of both cervix and uterus: Secondary | ICD-10-CM | POA: Diagnosis not present

## 2020-03-10 DIAGNOSIS — K219 Gastro-esophageal reflux disease without esophagitis: Secondary | ICD-10-CM | POA: Diagnosis present

## 2020-03-10 DIAGNOSIS — I2510 Atherosclerotic heart disease of native coronary artery without angina pectoris: Secondary | ICD-10-CM | POA: Diagnosis present

## 2020-03-10 DIAGNOSIS — Z794 Long term (current) use of insulin: Secondary | ICD-10-CM

## 2020-03-10 DIAGNOSIS — Z825 Family history of asthma and other chronic lower respiratory diseases: Secondary | ICD-10-CM | POA: Diagnosis not present

## 2020-03-10 DIAGNOSIS — Z8249 Family history of ischemic heart disease and other diseases of the circulatory system: Secondary | ICD-10-CM | POA: Diagnosis not present

## 2020-03-10 DIAGNOSIS — Z20822 Contact with and (suspected) exposure to covid-19: Secondary | ICD-10-CM | POA: Diagnosis present

## 2020-03-10 DIAGNOSIS — E669 Obesity, unspecified: Secondary | ICD-10-CM | POA: Diagnosis present

## 2020-03-10 DIAGNOSIS — F319 Bipolar disorder, unspecified: Secondary | ICD-10-CM | POA: Diagnosis present

## 2020-03-10 DIAGNOSIS — E7800 Pure hypercholesterolemia, unspecified: Secondary | ICD-10-CM | POA: Diagnosis present

## 2020-03-10 DIAGNOSIS — E119 Type 2 diabetes mellitus without complications: Secondary | ICD-10-CM

## 2020-03-10 DIAGNOSIS — I237 Postinfarction angina: Secondary | ICD-10-CM | POA: Diagnosis present

## 2020-03-10 DIAGNOSIS — I214 Non-ST elevation (NSTEMI) myocardial infarction: Secondary | ICD-10-CM | POA: Diagnosis present

## 2020-03-10 DIAGNOSIS — Z888 Allergy status to other drugs, medicaments and biological substances status: Secondary | ICD-10-CM | POA: Diagnosis not present

## 2020-03-10 DIAGNOSIS — I25118 Atherosclerotic heart disease of native coronary artery with other forms of angina pectoris: Secondary | ICD-10-CM | POA: Diagnosis present

## 2020-03-10 DIAGNOSIS — E785 Hyperlipidemia, unspecified: Secondary | ICD-10-CM | POA: Diagnosis present

## 2020-03-10 DIAGNOSIS — Z79899 Other long term (current) drug therapy: Secondary | ICD-10-CM | POA: Diagnosis not present

## 2020-03-10 DIAGNOSIS — R0789 Other chest pain: Secondary | ICD-10-CM | POA: Diagnosis present

## 2020-03-10 DIAGNOSIS — Z955 Presence of coronary angioplasty implant and graft: Secondary | ICD-10-CM | POA: Diagnosis not present

## 2020-03-10 DIAGNOSIS — J449 Chronic obstructive pulmonary disease, unspecified: Secondary | ICD-10-CM | POA: Diagnosis present

## 2020-03-10 DIAGNOSIS — I25119 Atherosclerotic heart disease of native coronary artery with unspecified angina pectoris: Secondary | ICD-10-CM | POA: Diagnosis present

## 2020-03-10 DIAGNOSIS — K5090 Crohn's disease, unspecified, without complications: Secondary | ICD-10-CM | POA: Diagnosis present

## 2020-03-10 DIAGNOSIS — Z87891 Personal history of nicotine dependence: Secondary | ICD-10-CM | POA: Diagnosis not present

## 2020-03-10 HISTORY — PX: LEFT HEART CATH AND CORONARY ANGIOGRAPHY: CATH118249

## 2020-03-10 LAB — RESP PANEL BY RT-PCR (FLU A&B, COVID) ARPGX2
Influenza A by PCR: NEGATIVE
Influenza B by PCR: NEGATIVE
SARS Coronavirus 2 by RT PCR: NEGATIVE

## 2020-03-10 LAB — GLUCOSE, CAPILLARY
Glucose-Capillary: 182 mg/dL — ABNORMAL HIGH (ref 70–99)
Glucose-Capillary: 250 mg/dL — ABNORMAL HIGH (ref 70–99)
Glucose-Capillary: 327 mg/dL — ABNORMAL HIGH (ref 70–99)

## 2020-03-10 LAB — TROPONIN I (HIGH SENSITIVITY): Troponin I (High Sensitivity): 427 ng/L (ref <18)

## 2020-03-10 LAB — CBG MONITORING, ED
Glucose-Capillary: 165 mg/dL — ABNORMAL HIGH (ref 70–99)
Glucose-Capillary: 167 mg/dL — ABNORMAL HIGH (ref 70–99)
Glucose-Capillary: 141 mg/dL — ABNORMAL HIGH (ref 70–99)

## 2020-03-10 SURGERY — LEFT HEART CATH AND CORONARY ANGIOGRAPHY
Anesthesia: Moderate Sedation

## 2020-03-10 MED ORDER — LIDOCAINE HCL (PF) 1 % IJ SOLN
INTRAMUSCULAR | Status: DC | PRN
  Administered 2020-03-10: 20 mL

## 2020-03-10 MED ORDER — LOSARTAN POTASSIUM 25 MG PO TABS
25.0000 mg | ORAL_TABLET | Freq: Every day | ORAL | Status: DC
  Administered 2020-03-11: 25 mg via ORAL
  Filled 2020-03-10: qty 1

## 2020-03-10 MED ORDER — ASCORBIC ACID 500 MG PO TABS
1000.0000 mg | ORAL_TABLET | Freq: Every day | ORAL | Status: DC
  Administered 2020-03-10 – 2020-03-11 (×2): 1000 mg via ORAL
  Filled 2020-03-10 (×2): qty 2

## 2020-03-10 MED ORDER — NITROGLYCERIN 0.4 MG SL SUBL
SUBLINGUAL_TABLET | SUBLINGUAL | Status: AC
  Filled 2020-03-10: qty 1

## 2020-03-10 MED ORDER — DIPHENHYDRAMINE HCL 50 MG/ML IJ SOLN
50.0000 mg | Freq: Once | INTRAMUSCULAR | Status: AC
  Administered 2020-03-10: 50 mg via INTRAVENOUS

## 2020-03-10 MED ORDER — METHYLPREDNISOLONE SODIUM SUCC 125 MG IJ SOLR
125.0000 mg | Freq: Once | INTRAMUSCULAR | Status: AC
  Administered 2020-03-10: 125 mg via INTRAVENOUS

## 2020-03-10 MED ORDER — ALBUTEROL SULFATE (2.5 MG/3ML) 0.083% IN NEBU: 2.5000 mg | INHALATION_SOLUTION | Freq: Four times a day (QID) | RESPIRATORY_TRACT | Status: DC | PRN

## 2020-03-10 MED ORDER — FUROSEMIDE 10 MG/ML IJ SOLN
INTRAMUSCULAR | Status: AC
  Filled 2020-03-10: qty 4

## 2020-03-10 MED ORDER — SODIUM CHLORIDE 0.9% FLUSH: 3.0000 mL | INTRAVENOUS | Status: DC | PRN

## 2020-03-10 MED ORDER — METOPROLOL TARTRATE 25 MG PO TABS: 25.0000 mg | ORAL_TABLET | Freq: Two times a day (BID) | ORAL | Status: DC

## 2020-03-10 MED ORDER — FAMOTIDINE 20 MG PO TABS
ORAL_TABLET | ORAL | Status: AC
  Filled 2020-03-10: qty 2

## 2020-03-10 MED ORDER — MIDAZOLAM HCL 2 MG/2ML IJ SOLN
INTRAMUSCULAR | Status: DC | PRN
  Administered 2020-03-10: 1 mg via INTRAVENOUS

## 2020-03-10 MED ORDER — ONDANSETRON HCL 4 MG/2ML IJ SOLN: 4.0000 mg | Freq: Four times a day (QID) | INTRAMUSCULAR | Status: DC | PRN

## 2020-03-10 MED ORDER — NITROGLYCERIN 0.4 MG SL SUBL
SUBLINGUAL_TABLET | SUBLINGUAL | Status: DC | PRN
  Administered 2020-03-10: .4 mg via SUBLINGUAL

## 2020-03-10 MED ORDER — PANTOPRAZOLE SODIUM 40 MG PO TBEC
40.0000 mg | DELAYED_RELEASE_TABLET | Freq: Every day | ORAL | Status: DC
  Administered 2020-03-10 – 2020-03-11 (×2): 40 mg via ORAL
  Filled 2020-03-10 (×3): qty 1

## 2020-03-10 MED ORDER — LOSARTAN POTASSIUM 50 MG PO TABS: 25.0000 mg | ORAL_TABLET | Freq: Every day | ORAL | Status: DC

## 2020-03-10 MED ORDER — INSULIN ASPART 100 UNIT/ML ~~LOC~~ SOLN
0.0000 [IU] | Freq: Three times a day (TID) | SUBCUTANEOUS | Status: DC
  Administered 2020-03-11: 7 [IU] via SUBCUTANEOUS
  Filled 2020-03-10: qty 1

## 2020-03-10 MED ORDER — IOHEXOL 300 MG/ML  SOLN
INTRAMUSCULAR | Status: DC | PRN
  Administered 2020-03-10: 75 mL

## 2020-03-10 MED ORDER — OXYCODONE HCL 5 MG PO TABS: 5.0000 mg | ORAL_TABLET | ORAL | Status: DC | PRN

## 2020-03-10 MED ORDER — PRASUGREL HCL 10 MG PO TABS
10.0000 mg | ORAL_TABLET | Freq: Every day | ORAL | Status: DC
  Filled 2020-03-10: qty 1

## 2020-03-10 MED ORDER — ROSUVASTATIN CALCIUM 5 MG PO TABS
5.0000 mg | ORAL_TABLET | Freq: Every day | ORAL | Status: DC
  Administered 2020-03-10 – 2020-03-11 (×2): 5 mg via ORAL
  Filled 2020-03-10 (×2): qty 1

## 2020-03-10 MED ORDER — PRASUGREL HCL 10 MG PO TABS
10.0000 mg | ORAL_TABLET | Freq: Every day | ORAL | Status: DC
  Administered 2020-03-10 – 2020-03-11 (×2): 10 mg via ORAL
  Filled 2020-03-10 (×2): qty 1

## 2020-03-10 MED ORDER — SODIUM CHLORIDE 0.9% FLUSH
3.0000 mL | Freq: Two times a day (BID) | INTRAVENOUS | Status: DC
  Administered 2020-03-11 (×2): 3 mL via INTRAVENOUS

## 2020-03-10 MED ORDER — FLUTICASONE PROPIONATE 50 MCG/ACT NA SUSP
1.0000 | Freq: Every day | NASAL | Status: DC
  Filled 2020-03-10: qty 16

## 2020-03-10 MED ORDER — SODIUM CHLORIDE 0.9% FLUSH: 3.0000 mL | Freq: Two times a day (BID) | INTRAVENOUS | Status: DC

## 2020-03-10 MED ORDER — ISOSORBIDE MONONITRATE ER 30 MG PO TB24
30.0000 mg | ORAL_TABLET | Freq: Every day | ORAL | Status: DC
  Administered 2020-03-10 – 2020-03-11 (×2): 30 mg via ORAL
  Filled 2020-03-10 (×3): qty 1

## 2020-03-10 MED ORDER — HEPARIN (PORCINE) IN NACL 1000-0.9 UT/500ML-% IV SOLN
INTRAVENOUS | Status: DC | PRN
  Administered 2020-03-10: 500 mL

## 2020-03-10 MED ORDER — SODIUM CHLORIDE 0.9 % IV SOLN: 250.0000 mL | INTRAVENOUS | Status: DC | PRN

## 2020-03-10 MED ORDER — LIDOCAINE HCL (PF) 1 % IJ SOLN
INTRAMUSCULAR | Status: AC
  Filled 2020-03-10: qty 30

## 2020-03-10 MED ORDER — ENOXAPARIN SODIUM 60 MG/0.6ML ~~LOC~~ SOLN
50.0000 mg | SUBCUTANEOUS | Status: DC
  Administered 2020-03-10: 50 mg via SUBCUTANEOUS
  Filled 2020-03-10: qty 0.6

## 2020-03-10 MED ORDER — FENTANYL CITRATE (PF) 100 MCG/2ML IJ SOLN
INTRAMUSCULAR | Status: DC | PRN
  Administered 2020-03-10: 25 ug via INTRAVENOUS

## 2020-03-10 MED ORDER — FUROSEMIDE 10 MG/ML IJ SOLN
INTRAMUSCULAR | Status: DC | PRN
  Administered 2020-03-10: 20 mg via INTRAVENOUS

## 2020-03-10 MED ORDER — HYDRALAZINE HCL 20 MG/ML IJ SOLN: 10.0000 mg | INTRAMUSCULAR | Status: AC | PRN

## 2020-03-10 MED ORDER — HEPARIN (PORCINE) IN NACL 1000-0.9 UT/500ML-% IV SOLN
INTRAVENOUS | Status: AC
  Filled 2020-03-10: qty 1000

## 2020-03-10 MED ORDER — SODIUM CHLORIDE 0.9 % WEIGHT BASED INFUSION: 1.0000 mL/kg/h | INTRAVENOUS | Status: DC

## 2020-03-10 MED ORDER — INSULIN GLARGINE 100 UNIT/ML ~~LOC~~ SOLN
20.0000 [IU] | Freq: Every day | SUBCUTANEOUS | Status: DC
  Administered 2020-03-10 (×2): 20 [IU] via SUBCUTANEOUS
  Filled 2020-03-10 (×3): qty 0.2

## 2020-03-10 MED ORDER — METOPROLOL TARTRATE 25 MG PO TABS
12.5000 mg | ORAL_TABLET | Freq: Two times a day (BID) | ORAL | Status: DC
  Administered 2020-03-10 – 2020-03-11 (×2): 12.5 mg via ORAL
  Filled 2020-03-10 (×3): qty 1

## 2020-03-10 MED ORDER — LABETALOL HCL 5 MG/ML IV SOLN: 10.0000 mg | INTRAVENOUS | Status: AC | PRN

## 2020-03-10 MED ORDER — HYOSCYAMINE SULFATE 0.125 MG SL SUBL
0.1250 mg | SUBLINGUAL_TABLET | Freq: Four times a day (QID) | SUBLINGUAL | Status: DC | PRN
  Filled 2020-03-10: qty 1

## 2020-03-10 MED ORDER — FENTANYL CITRATE (PF) 100 MCG/2ML IJ SOLN
INTRAMUSCULAR | Status: AC
  Filled 2020-03-10: qty 2

## 2020-03-10 MED ORDER — ASPIRIN 81 MG PO CHEW
81.0000 mg | CHEWABLE_TABLET | Freq: Every day | ORAL | Status: DC
  Filled 2020-03-10: qty 1

## 2020-03-10 MED ORDER — ACETAMINOPHEN 325 MG PO TABS: 650.0000 mg | ORAL_TABLET | ORAL | Status: DC | PRN

## 2020-03-10 MED ORDER — BUPROPION HCL ER (XL) 150 MG PO TB24
300.0000 mg | ORAL_TABLET | Freq: Every day | ORAL | Status: DC
  Administered 2020-03-10 – 2020-03-11 (×2): 300 mg via ORAL
  Filled 2020-03-10 (×2): qty 2

## 2020-03-10 MED ORDER — ASPIRIN 81 MG PO CHEW
81.0000 mg | CHEWABLE_TABLET | Freq: Every day | ORAL | Status: DC
  Administered 2020-03-10 – 2020-03-11 (×2): 81 mg via ORAL
  Filled 2020-03-10 (×2): qty 1

## 2020-03-10 MED ORDER — SODIUM CHLORIDE 0.9 % WEIGHT BASED INFUSION
3.0000 mL/kg/h | INTRAVENOUS | Status: DC
  Administered 2020-03-10: 3 mL/kg/h via INTRAVENOUS

## 2020-03-10 MED ORDER — ASPIRIN 81 MG PO CHEW: 81.0000 mg | CHEWABLE_TABLET | ORAL | Status: DC

## 2020-03-10 MED ORDER — MORPHINE SULFATE (PF) 2 MG/ML IV SOLN
2.0000 mg | INTRAVENOUS | Status: DC | PRN
  Administered 2020-03-10: 2 mg via INTRAVENOUS
  Filled 2020-03-10: qty 1

## 2020-03-10 MED ORDER — NITROGLYCERIN 0.4 MG SL SUBL: 0.4000 mg | SUBLINGUAL_TABLET | SUBLINGUAL | Status: DC | PRN

## 2020-03-10 MED ORDER — MOMETASONE FURO-FORMOTEROL FUM 200-5 MCG/ACT IN AERO
2.0000 | INHALATION_SPRAY | Freq: Two times a day (BID) | RESPIRATORY_TRACT | Status: DC
  Filled 2020-03-10: qty 8.8

## 2020-03-10 MED ORDER — AMLODIPINE BESYLATE 5 MG PO TABS
5.0000 mg | ORAL_TABLET | Freq: Every day | ORAL | Status: DC
  Administered 2020-03-10 – 2020-03-11 (×2): 5 mg via ORAL
  Filled 2020-03-10 (×2): qty 1

## 2020-03-10 MED ORDER — ACETAMINOPHEN 325 MG PO TABS
650.0000 mg | ORAL_TABLET | Freq: Four times a day (QID) | ORAL | Status: DC | PRN
  Administered 2020-03-10: 650 mg via ORAL
  Filled 2020-03-10: qty 2

## 2020-03-10 MED ORDER — FAMOTIDINE 20 MG PO TABS
40.0000 mg | ORAL_TABLET | Freq: Once | ORAL | Status: AC
  Administered 2020-03-10: 40 mg via ORAL

## 2020-03-10 MED ORDER — DIPHENHYDRAMINE HCL 50 MG/ML IJ SOLN
INTRAMUSCULAR | Status: AC
  Filled 2020-03-10: qty 1

## 2020-03-10 MED ORDER — MIDAZOLAM HCL 2 MG/2ML IJ SOLN
INTRAMUSCULAR | Status: AC
  Filled 2020-03-10: qty 2

## 2020-03-10 MED ORDER — METHYLPREDNISOLONE SODIUM SUCC 125 MG IJ SOLR
INTRAMUSCULAR | Status: AC
  Filled 2020-03-10: qty 2

## 2020-03-10 SURGICAL SUPPLY — 10 items
CATH INFINITI 5FR JL4 (CATHETERS) ×2 IMPLANT
CATH INFINITI JR4 5F (CATHETERS) ×2 IMPLANT
DEVICE CLOSURE MYNXGRIP 5F (Vascular Products) ×2 IMPLANT
KIT MANI 3VAL PERCEP (MISCELLANEOUS) ×3 IMPLANT
NDL PERC 18GX7CM (NEEDLE) IMPLANT
NEEDLE PERC 18GX7CM (NEEDLE) ×3 IMPLANT
PACK CARDIAC CATH (CUSTOM PROCEDURE TRAY) ×3 IMPLANT
PANNUS RETENTION SYSTEM 2 PAD (MISCELLANEOUS) ×2 IMPLANT
SHEATH AVANTI 5FR X 11CM (SHEATH) ×2 IMPLANT
WIRE GUIDERIGHT .035X150 (WIRE) ×2 IMPLANT

## 2020-03-10 NOTE — Progress Notes (Signed)
Report given to Amanda Wiley. Patient and patient's husband aware of admission overnight.  "I feel better about that" per husband Patient without c/o's, vitals stable, right femoral mynx intact, no s/s hematoma. Tolerating water without event.

## 2020-03-10 NOTE — Progress Notes (Signed)
Patient  Transported to special recovery via stretcher on monitor with RN. Patient awake/alert x4. Has been NPO except for sips of water approx 1320. States having midsternal chest discomfort 3/10, O2 placed at 2 liters [place] with relief of chest discomfort. Pre-medicated with benadryl IV, po pepcid and 125m solumedrol per orders.

## 2020-03-10 NOTE — Progress Notes (Signed)
[name], female   DOB: [birth year], 64 y.o.   MRN: [id number] [facility] PROGRESS NOTE  BRINKLEE CISSE QVZ:[id number] DOB: [birth year] DOA: 03/09/2020 PCP: Einar Pheasant, MD  HPI/Subjective: Patient seen this morning came back with chest pain.  Did have some pain radiating to the right upper back.  During the day did have some chest discomfort.  With morphine early morning it dropped her blood pressure and dropped her oxygen saturations.  Patient brought over to the cardiac Cath Lab.  Objective: Vitals:   03/10/20 1400 03/10/20 1429  BP: (!) 111/52 (!) 127/55  Pulse: 66 70  Resp: 18 (!) 22  Temp:  98.1 F (36.7 C)  SpO2: 93% 95%   No intake or output data in the 24 hours ending 03/10/20 1518 Filed Weights   03/09/20 2113 03/10/20 1429  Weight: 102 kg 102 kg    ROS: Review of Systems  Respiratory: Negative for shortness of breath.   Cardiovascular: Positive for chest pain.  Gastrointestinal: Negative for abdominal pain, nausea and vomiting.  Musculoskeletal: Positive for back pain.   Exam: Physical Exam HENT:     Head: Normocephalic.     Mouth/Throat:     Pharynx: No oropharyngeal exudate.  Eyes:     General: Lids are normal.     Conjunctiva/sclera: Conjunctivae normal.     Pupils: Pupils are equal, round, and reactive to light.  Cardiovascular:     Rate and Rhythm: Normal rate and regular rhythm.     Heart sounds: Normal heart sounds, S1 normal and S2 normal.  Pulmonary:     Breath sounds: No decreased breath sounds, wheezing, rhonchi or rales.  Abdominal:     Palpations: Abdomen is soft.     Tenderness: There is no abdominal tenderness.  Musculoskeletal:     Right lower leg: No swelling.     Left lower leg: No swelling.  Skin:    General: Skin is warm.     Findings: No rash.  Neurological:     Mental Status: She is alert and oriented to person, place, and time.       Data Reviewed: Basic Metabolic Panel: Recent Labs  Lab  03/05/20 2237 03/06/20 0435 03/07/20 0644 03/08/20 0526 03/09/20 2126  NA 142 139 141 143 141  K 3.9 3.5 3.3* 4.1 3.9  CL 109 105 109 109 106  CO2 19* 22 22 24 24   GLUCOSE 211* 146* 165* 149* 224*  BUN 13 11 15 18 13   CREATININE 0.96 0.79 0.83 0.83 0.88  CALCIUM 9.5 9.2 8.5* 8.8* 8.8*   Liver Function Tests: Recent Labs  Lab 03/07/20 0644 03/08/20 0526  AST 26 17  ALT 23 23  ALKPHOS 51 48  BILITOT 0.8 0.5  PROT 6.2* 6.6  ALBUMIN 3.2* 3.3*    CBC: Recent Labs  Lab 03/05/20 2237 03/06/20 0435 03/07/20 0644 03/08/20 0526 03/09/20 2216  WBC 9.8 9.9 9.4 10.1 10.2  HGB 15.6* 14.7 13.9 13.7 14.3  HCT 47.6* 44.1 42.7 42.1 43.6  MCV 85.6 85.8 86.4 86.6 87.7  PLT 216 212 184 213 178    CBG: Recent Labs  Lab 03/07/20 2054 03/08/20 0825 03/10/20 0437 03/10/20 1002 03/10/20 1255  GLUCAP 316* 156* 165* 167* 141*    Recent Results (from the past 240 hour(s))  Resp Panel by RT-PCR (Flu A&B, Covid) Nasopharyngeal Swab     Status: None   Collection Time: 03/06/20 12:34 AM   Specimen: Nasopharyngeal Swab; Nasopharyngeal(NP) swabs in vial transport  medium  Result Value Ref Range Status   SARS Coronavirus 2 by RT PCR NEGATIVE NEGATIVE Final    Comment: (NOTE) SARS-CoV-2 target nucleic acids are NOT DETECTED.  The SARS-CoV-2 RNA is generally detectable in upper respiratory specimens during the acute phase of infection. The lowest concentration of SARS-CoV-2 viral copies this assay can detect is 138 copies/mL. A negative result does not preclude SARS-Cov-2 infection and should not be used as the sole basis for treatment or other patient management decisions. A negative result may occur with  improper specimen collection/handling, submission of specimen other than nasopharyngeal swab, presence of viral mutation(s) within the areas targeted by this assay, and inadequate number of viral copies(<138 copies/mL). A negative result must be combined with clinical  observations, patient history, and epidemiological information. The expected result is Negative.  Fact Sheet for Patients:  EntrepreneurPulse.com.au  Fact Sheet for Healthcare Providers:  IncredibleEmployment.be  This test is no t yet approved or cleared by the [place] FDA and  has been authorized for detection and/or diagnosis of SARS-CoV-2 by FDA under an Emergency Use Authorization (EUA). This EUA will remain  in effect (meaning this test can be used) for the duration of the COVID-19 declaration under Section 564(b)(1) of the Act, 21 U.S.C.section 360bbb-3(b)(1), unless the authorization is terminated  or revoked sooner.       Influenza A by PCR NEGATIVE NEGATIVE Final   Influenza B by PCR NEGATIVE NEGATIVE Final    Comment: (NOTE) The Xpert Xpress SARS-CoV-2/FLU/RSV plus assay is intended as an aid in the diagnosis of influenza from Nasopharyngeal swab specimens and should not be used as a sole basis for treatment. Nasal washings and aspirates are unacceptable for Xpert Xpress SARS-CoV-2/FLU/RSV testing.  Fact Sheet for Patients: EntrepreneurPulse.com.au  Fact Sheet for Healthcare Providers: IncredibleEmployment.be  This test is not yet approved or cleared by the [place] FDA and has been authorized for detection and/or diagnosis of SARS-CoV-2 by FDA under an Emergency Use Authorization (EUA). This EUA will remain in effect (meaning this test can be used) for the duration of the COVID-19 declaration under Section 564(b)(1) of the Act, 21 U.S.C. section 360bbb-3(b)(1), unless the authorization is terminated or revoked.  Performed at [facility], [address] [place], [place] [address]   Culture, blood (routine x 2)     Status: Abnormal   Collection Time: 03/06/20 12:35 AM   Specimen: BLOOD  Result Value Ref Range Status   Specimen Description   Final    BLOOD RIGHT  ANTECUBITAL Performed at [facility], [address] [place], [place] [address]    Special Requests   Final    BOTTLES DRAWN AEROBIC AND ANAEROBIC Blood Culture adequate volume Performed at [facility], [address] [place], [place] [address]    Culture  Setup Time   Final    Organism ID to follow AEROBIC BOTTLE ONLY GRAM POSITIVE COCCI CRITICAL RESULT CALLED TO, READ BACK BY AND VERIFIED WITH: ALEX CHAPPELL 03/06/20 AT 1825 BY ACR Performed at [facility], [address] [place], [place] [address]    Culture (A)  Final    STAPHYLOCOCCUS HOMINIS THE SIGNIFICANCE OF ISOLATING THIS ORGANISM FROM A SINGLE SET OF BLOOD CULTURES WHEN MULTIPLE SETS ARE DRAWN IS UNCERTAIN. PLEASE NOTIFY THE MICROBIOLOGY DEPARTMENT WITHIN ONE WEEK IF SPECIATION AND SENSITIVITIES ARE REQUIRED. Performed at [facility], [address], [place], [place] [address]    Report Status 03/07/2020 FINAL  Final  Blood  Culture ID Panel (Reflexed)     Status: Abnormal   Collection Time: 03/06/20 12:35 AM  Result Value Ref Range Status   Enterococcus faecalis NOT DETECTED NOT DETECTED Final   Enterococcus Faecium NOT DETECTED NOT DETECTED Final   Listeria monocytogenes NOT DETECTED NOT DETECTED Final   Staphylococcus species DETECTED (A) NOT DETECTED Final    Comment: CRITICAL RESULT CALLED TO, READ BACK BY AND VERIFIED WITH: ALEX CHAPPELL 03/06/20 AT 1825 BY ACR    Staphylococcus aureus (BCID) NOT DETECTED NOT DETECTED Final   Staphylococcus epidermidis NOT DETECTED NOT DETECTED Final   Staphylococcus lugdunensis NOT DETECTED NOT DETECTED Final   Streptococcus species NOT DETECTED NOT DETECTED Final   Streptococcus agalactiae NOT DETECTED NOT DETECTED Final   Streptococcus pneumoniae NOT DETECTED NOT DETECTED Final   Streptococcus pyogenes NOT DETECTED NOT DETECTED Final   A.calcoaceticus-baumannii NOT DETECTED NOT DETECTED Final   Bacteroides fragilis NOT DETECTED NOT  DETECTED Final   Enterobacterales NOT DETECTED NOT DETECTED Final   Enterobacter cloacae complex NOT DETECTED NOT DETECTED Final   Escherichia coli NOT DETECTED NOT DETECTED Final   Klebsiella aerogenes NOT DETECTED NOT DETECTED Final   Klebsiella oxytoca NOT DETECTED NOT DETECTED Final   Klebsiella pneumoniae NOT DETECTED NOT DETECTED Final   Proteus species NOT DETECTED NOT DETECTED Final   Salmonella species NOT DETECTED NOT DETECTED Final   Serratia marcescens NOT DETECTED NOT DETECTED Final   Haemophilus influenzae NOT DETECTED NOT DETECTED Final   Neisseria meningitidis NOT DETECTED NOT DETECTED Final   Pseudomonas aeruginosa NOT DETECTED NOT DETECTED Final   Stenotrophomonas maltophilia NOT DETECTED NOT DETECTED Final   Candida albicans NOT DETECTED NOT DETECTED Final   Candida auris NOT DETECTED NOT DETECTED Final   Candida glabrata NOT DETECTED NOT DETECTED Final   Candida krusei NOT DETECTED NOT DETECTED Final   Candida parapsilosis NOT DETECTED NOT DETECTED Final   Candida tropicalis NOT DETECTED NOT DETECTED Final   Cryptococcus neoformans/gattii NOT DETECTED NOT DETECTED Final    Comment: Performed at [facility], [address] [place], [place] [address]  Culture, blood (routine x 2)     Status: None (Preliminary result)   Collection Time: 03/06/20  4:35 AM   Specimen: BLOOD  Result Value Ref Range Status   Specimen Description BLOOD BLOOD RIGHT FOREARM  Final   Special Requests   Final    BOTTLES DRAWN AEROBIC AND ANAEROBIC Blood Culture adequate volume   Culture   Final    NO GROWTH 4 DAYS Performed at [facility], [address] [place], [place] [address]    Report Status PENDING  Incomplete  Resp Panel by RT-PCR (Flu A&B, Covid) Nasopharyngeal Swab     Status: None   Collection Time: 03/10/20  5:33 AM   Specimen: Nasopharyngeal Swab; Nasopharyngeal(NP) swabs in vial transport medium  Result Value Ref Range Status   SARS Coronavirus 2 by  RT PCR NEGATIVE NEGATIVE Final    Comment: (NOTE) SARS-CoV-2 target nucleic acids are NOT DETECTED.  The SARS-CoV-2 RNA is generally detectable in upper respiratory specimens during the acute phase of infection. The lowest concentration of SARS-CoV-2 viral copies this assay can detect is 138 copies/mL. A negative result does not preclude SARS-Cov-2 infection and should not be used as the sole basis for treatment or other patient management decisions. A negative result may occur with  improper specimen collection/handling, submission of specimen other than nasopharyngeal swab, presence of viral mutation(s) within the areas targeted by this  assay, and inadequate number of viral copies(<138 copies/mL). A negative result must be combined with clinical observations, patient history, and epidemiological information. The expected result is Negative.  Fact Sheet for Patients:  EntrepreneurPulse.com.au  Fact Sheet for Healthcare Providers:  IncredibleEmployment.be  This test is no t yet approved or cleared by the [place] FDA and  has been authorized for detection and/or diagnosis of SARS-CoV-2 by FDA under an Emergency Use Authorization (EUA). This EUA will remain  in effect (meaning this test can be used) for the duration of the COVID-19 declaration under Section 564(b)(1) of the Act, 21 U.S.C.section 360bbb-3(b)(1), unless the authorization is terminated  or revoked sooner.       Influenza A by PCR NEGATIVE NEGATIVE Final   Influenza B by PCR NEGATIVE NEGATIVE Final    Comment: (NOTE) The Xpert Xpress SARS-CoV-2/FLU/RSV plus assay is intended as an aid in the diagnosis of influenza from Nasopharyngeal swab specimens and should not be used as a sole basis for treatment. Nasal washings and aspirates are unacceptable for Xpert Xpress SARS-CoV-2/FLU/RSV testing.  Fact Sheet for Patients: EntrepreneurPulse.com.au  Fact Sheet  for Healthcare Providers: IncredibleEmployment.be  This test is not yet approved or cleared by the [place] FDA and has been authorized for detection and/or diagnosis of SARS-CoV-2 by FDA under an Emergency Use Authorization (EUA). This EUA will remain in effect (meaning this test can be used) for the duration of the COVID-19 declaration under Section 564(b)(1) of the Act, 21 U.S.C. section 360bbb-3(b)(1), unless the authorization is terminated or revoked.  Performed at [facility], [address] [place], [place] [address]      Studies: DG Chest Portable 1 View  Result Date: 03/09/2020 CLINICAL DATA:  Status post cardiac catheterization, chest pain EXAM: PORTABLE CHEST 1 VIEW COMPARISON:  03/05/2020 FINDINGS: Lungs are clear. No pneumothorax or pleural effusion. Previously noted retrocardiac mass has resolved, likely artifactual on prior examination given absence of segment and findings on subsequent CT examination of 03/06/2020. Cardiac size within normal limits. Pulmonary vascularity is normal. IMPRESSION: No active disease. Electronically Signed   By: Fidela Salisbury MD   On: 03/09/2020 22:10    Scheduled Meds: . famotidine      . [MAR Hold] ascorbic acid  1,000 mg Oral Daily  . [MAR Hold] aspirin  81 mg Oral Daily  . [START ON 03/11/2020] aspirin  81 mg Oral Pre-Cath  . [MAR Hold] buPROPion  300 mg Oral Daily  . [MAR Hold] diphenhydrAMINE  50 mg Intravenous Once  . [MAR Hold] enoxaparin (LOVENOX) injection  50 mg Subcutaneous Q24H  . [MAR Hold] fluticasone  1 spray Each Nare Daily  . [MAR Hold] insulin aspart  0-20 Units Subcutaneous TID WC  . [MAR Hold] insulin glargine  20 Units Subcutaneous QHS  . [MAR Hold] losartan  25 mg Oral Daily  . [MAR Hold] methylPREDNISolone (SOLU-MEDROL) injection  125 mg Intravenous Once  . [MAR Hold] metoprolol tartrate  12.5 mg Oral BID  . [MAR Hold] mometasone-formoterol  2 puff Inhalation BID  . [MAR Hold]  pantoprazole  40 mg Oral Daily  . [MAR Hold] prasugrel  10 mg Oral Daily  . [MAR Hold] rosuvastatin  5 mg Oral Daily  . [MAR Hold] sodium chloride flush  3 mL Intravenous Q12H   Continuous Infusions: . sodium chloride    . [START ON 03/11/2020] sodium chloride 3 mL/kg/hr (03/10/20 1444)   Followed by  . [START ON 03/11/2020] sodium chloride  Assessment/Plan:  1. Chest pain with recent NSTEMI.  Troponin flat but elevated but less elevated than when had the NSTEMI.  Cardiology taking back to the cardiac Cath Lab for relook procedure.  Likely watch overnight.  Continue aspirin, Crestor, Effient, metoprolol. 2. Type 2 diabetes mellitus on Lantus insulin.  Hold Metformin 3. Bipolar disorder continue psychiatric medications 4. Asthma continue inhalers 5. Recent positive blood culture in 1 set of staph hominis likely contamination. 6. Essential hypertension on losartan and metoprolol      Code Status:     Code Status Orders  (From admission, onward)         Start     Ordered   03/09/20 2359  Full code  Continuous        03/10/20 0000        Code Status History    Date Active Date Inactive Code Status Order ID Comments User Context   03/06/2020 0247 03/08/2020 1612 Full Code [id number]  Lang Snow, [place] [facility]   03/06/2020 0046 03/06/2020 0247 DNR [id number]  Christel Mormon, MD ED   02/03/2019 1800 02/10/2019 1946 DNR [id number]  Cherene Altes, MD Inpatient   02/03/2019 1350 02/03/2019 1507 DNR [id number]  Flora Lipps, MD Inpatient   02/03/2019 0042 02/03/2019 1350 Full Code [id number]  Henreitta Leber, MD Inpatient   01/12/2019 2132 01/19/2019 1921 Full Code [id number]  Phillips Grout, MD Inpatient   Advance Care Planning Activity     Family Communication: Spoke with husband on the phone Disposition Plan: Status is: Observation  Dispo: The patient is from: Home              Anticipated d/c is to: Home              Anticipated d/c date is: With late cardiac  catheterization today likely discharge on 03/11/2020.              Patient currently going to have a cardiac cath this afternoon  Consultants:  Cardiology  Time spent: 28 minutes, case discussed with cardiology  [place]

## 2020-03-10 NOTE — ED Notes (Signed)
Report given to cath lab.

## 2020-03-10 NOTE — Consult Note (Signed)
CARDIOLOGY CONSULT NOTE               [name] MRN: [id number] DOB/AGE: 11-14-1955 64 y.o.  Admit date: 03/09/2020 Referring Physician Dr. Bobbye Charleston hospitalist Primary Physician Einar Pheasant MD Primary Cardiologist Dr. Jordan Hawks Reason for Consultation post MI angina  HPI: Patient is a 64 year old female hypertension hyperlipidemia diabetes asthma.  Hypertension COPD asthma recent non-STEMI with subsequent cardiac cath PCI and stent November 29.  Patient started having recurrent chest pain symptoms at home for prepresented to the emergency room and admitted initial troponins were 400 but troponins on discharge were about the same state suggested to suggest a downward trend from initial non-STEMI.  Because of the midsternal chest pain rating to the back she was admitted for further evaluation and management  Review of systems complete and found to be negative unless listed above     Past Medical History:  Diagnosis Date  . Arthritis   . Asthma   . Complication of anesthesia    HARD TO WAKE UP  . COPD (chronic obstructive pulmonary disease) ([place])   . Crohn's disease ([place])   . Depression   . Diabetes mellitus ([place])   . Fracture, foot    post fall  . Hypercholesterolemia   . Hypertension   . IBS (irritable bowel syndrome)   . Nephrolithiasis    s/p lithotripsy [facility])  . Vitamin D deficiency     Past Surgical History:  Procedure Laterality Date  . ABDOMINAL HYSTERECTOMY  1985  . APPENDECTOMY  1985  . BREAST DUCTAL SYSTEM EXCISION Left 09/04/2019   Procedure: EXCISION DUCTAL SYSTEM BREAST;  Surgeon: Robert Bellow, MD;  Location: [facility] ORS;  Service: General;  Laterality: Left;  . BREAST EXCISIONAL BIOPSY Right 2013   benign  . COLONOSCOPY  2014  . COLONOSCOPY WITH PROPOFOL N/A 02/10/2020   Procedure: COLONOSCOPY WITH PROPOFOL;  Surgeon: Robert Bellow, MD;  Location: [facility] ENDOSCOPY;  Service: Endoscopy;  Laterality: N/A;  .  CORONARY STENT INTERVENTION N/A 03/07/2020   Procedure: CORONARY STENT INTERVENTION;  Surgeon: Isaias Cowman, MD;  Location: [place] [facility];  Service: Cardiovascular;  Laterality: N/A;  . EXCISION / BIOPSY BREAST / NIPPLE / DUCT Left 2019   papilloma  . EXCISION OF BREAST BIOPSY Left 2019   papilloma  . LAPAROSCOPIC CHOLECYSTECTOMY  2005   Dr Tamala Julian  . LAPAROSCOPIC SUPRACERVICAL HYSTERECTOMY  1985   left ovary not removed  . LEFT HEART CATH AND CORONARY ANGIOGRAPHY N/A 03/07/2020   Procedure: LEFT HEART CATH AND CORONARY ANGIOGRAPHY;  Surgeon: Isaias Cowman, MD;  Location: [place] [facility];  Service: Cardiovascular;  Laterality: N/A;  . LITHOTRIPSY     x7  . OOPHORECTOMY    . POLYPECTOMY    . SHOULDER SURGERY     right (pinning)  . SHOULDER SURGERY     left (pinning)  . TONSILLECTOMY  1966    (Not in a hospital admission)  Social History   Socioeconomic History  . Marital status: Married    Spouse name: Not on file  . Number of children: 2  . Years of education: Not on file  . Highest education level: Not on file  Occupational History    Employer: [employer]  Tobacco Use  . Smoking status: Former Smoker    Packs/day: 2.00    Years: 15.00    Pack years: 30.00    Quit date: 04/09/1997    Years since quitting: 22.9  .  Smokeless tobacco: Never Used  Vaping Use  . Vaping Use: Never used  Substance and Sexual Activity  . Alcohol use: Never    Alcohol/week: 0.0 standard drinks  . Drug use: No  . Sexual activity: Not on file  Other Topics Concern  . Not on file  Social History Narrative  . Not on file   Social Determinants of Health   Financial Resource Strain:   . Difficulty of Paying Living Expenses: Not on file  Food Insecurity:   . Worried About Charity fundraiser in the Last Year: Not on file  . Ran Out of Food in the Last Year: Not on file  Transportation Needs:   . Lack of Transportation (Medical): Not on file  . Lack of  Transportation (Non-Medical): Not on file  Physical Activity:   . Days of Exercise per Week: Not on file  . Minutes of Exercise per Session: Not on file  Stress:   . Feeling of Stress : Not on file  Social Connections:   . Frequency of Communication with Friends and Family: Not on file  . Frequency of Social Gatherings with Friends and Family: Not on file  . Attends Religious Services: Not on file  . Active Member of Clubs or Organizations: Not on file  . Attends Archivist Meetings: Not on file  . Marital Status: Not on file  Intimate Partner Violence:   . Fear of Current or Ex-Partner: Not on file  . Emotionally Abused: Not on file  . Physically Abused: Not on file  . Sexually Abused: Not on file    Family History  Problem Relation Age of Onset  . Heart disease Father   . Emphysema Maternal Grandmother   . COPD Mother   . Hypercholesterolemia Mother   . Emphysema Mother   . Hypertension Brother   . Emphysema Other        grandmother  . COPD Sister   . Crohn's disease Sister   . Hypertension Sister   . Breast cancer Neg Hx   . Colon cancer Neg Hx       Review of systems complete and found to be negative unless listed above      PHYSICAL EXAM  General: Well developed, well nourished, in no acute distress HEENT:  Normocephalic and atramatic Neck:  No JVD.  Lungs: Clear bilaterally to auscultation and percussion. Heart: HRRR . Normal S1 and S2 without gallops or murmurs.  Abdomen: Bowel sounds are positive, abdomen soft and non-tender  Msk:  Back normal, normal gait. Normal strength and tone for age. Extremities: No clubbing, cyanosis or edema.   Neuro: Alert and oriented X 3. Psych:  Good affect, responds appropriately  Labs:   Lab Results  Component Value Date   WBC 10.2 03/09/2020   HGB 14.3 03/09/2020   HCT 43.6 03/09/2020   MCV 87.7 03/09/2020   PLT 178 03/09/2020    Recent Labs  Lab 03/08/20 0526 03/08/20 0526 03/09/20 2126  NA 143    < > 141  K 4.1   < > 3.9  CL 109   < > 106  CO2 24   < > 24  BUN 18   < > 13  CREATININE 0.83   < > 0.88  CALCIUM 8.8*   < > 8.8*  PROT 6.6  --   --   BILITOT 0.5  --   --   ALKPHOS 48  --   --   ALT 23  --   --  AST 17  --   --   GLUCOSE 149*   < > 224*   < > = values in this interval not displayed.   Lab Results  Component Value Date   TROPONINI < 0.02 04/09/2013    Lab Results  Component Value Date   CHOL 222 (H) 02/12/2020   CHOL 210 (H) 10/14/2019   CHOL 208 (H) 06/02/2019   Lab Results  Component Value Date   HDL 40.50 02/12/2020   HDL 41.20 10/14/2019   HDL 39.20 06/02/2019   Lab Results  Component Value Date   LDLCALC 148 (H) 07/24/2013   Lab Results  Component Value Date   TRIG 330.0 (H) 02/12/2020   TRIG 324.0 (H) 10/14/2019   TRIG 396.0 (H) 06/02/2019   Lab Results  Component Value Date   CHOLHDL 5 02/12/2020   CHOLHDL 5 10/14/2019   CHOLHDL 5 06/02/2019   Lab Results  Component Value Date   LDLDIRECT 149.0 02/12/2020   LDLDIRECT 128.0 10/14/2019   LDLDIRECT 120.0 06/02/2019      Radiology: DG Chest 2 View  Result Date: 03/05/2020 CLINICAL DATA:  Chest pain.  Right arm into jaw. EXAM: CHEST - 2 VIEW COMPARISON:  Chest x-ray 02/07/2019 FINDINGS: The heart size and mediastinal contours unchanged. Biapical pleural/pulmonary scarring. Interval development of a retrocardiac airspace opacity. Similar appearing coarsened interstitial markings. No pulmonary edema. No pleural effusion. No pneumothorax. No acute osseous abnormality.  Right humeral surgical hardware. IMPRESSION: Interval development of a retrocardiac airspace opacity that could represent infection or inflammation. Followup PA and lateral chest X-ray is recommended in 3-4 weeks following trial therapy to ensure resolution and exclude underlying malignancy. Electronically Signed   By: Iven Finn M.D.   On: 03/05/2020 22:59   CT CHEST W CONTRAST  Result Date: 03/06/2020 CLINICAL DATA:   Follow-up abnormal chest radiograph. Rule out metastatic breast cancer. EXAM: CT CHEST WITH CONTRAST TECHNIQUE: Multidetector CT imaging of the chest was performed during intravenous contrast administration. CONTRAST:  18m OMNIPAQUE IOHEXOL 300 MG/ML  SOLN COMPARISON:  CT angio chest 02/02/19 FINDINGS: Cardiovascular: The heart size appears within normal limits. No pericardial effusion. Aortic atherosclerosis. Coronary artery calcifications. Mediastinum/Nodes: Normal appearance of the thyroid gland. The trachea appears patent and is midline. Normal appearance of the esophagus. No enlarged axillary, supraclavicular, mediastinal, or hilar lymph nodes. Lungs/Pleura: There are coarsened interstitial markings identified bilaterally, likely the sequelae of prior inflammation/infection. Mosaic attenuation is noted within the upper lung zones with areas of air trapping, likely reflecting small airways disease. Scar like density with surrounding architectural distortion is identified within the posterior left lower lobe, image 105/3, which likely accounts for the chest radiograph abnormality. Subpleural reticulation and scarring is also noted within the posterior right lung base. There is a calcified granuloma noted within the posterior right base, image 112/3. No suspicious pulmonary nodules to suggest metastatic disease Upper Abdomen: No acute abnormality identified within the imaged portions of the upper abdomen. Previous cholecystectomy. Scarring is noted involving the left kidney. Musculoskeletal: Postoperative changes from ORIF of the right proximal humerus noted. No acute or suspicious osseous abnormality. Small bone island noted within the upper thoracic spine measuring 5 mm. No signs of chest wall mass identified. IMPRESSION: 1. No findings to suggest metastatic disease to the chest. 2. Bilateral lower lobe areas of scarring and architectural distortion are identified. Findings are favored to represent sequelae of  inflammation/infection. 3. Mosaic attenuation pattern identified within the upper lung zones likely reflecting small airways disease. 4.  Aortic atherosclerosis. Coronary artery calcifications noted. Aortic Atherosclerosis (ICD10-I70.0). Electronically Signed   By: Kerby Moors M.D.   On: 03/06/2020 13:08   CARDIAC CATHETERIZATION  Result Date: 03/07/2020  Mid LAD lesion is 90% stenosed.  Prox RCA lesion is 20% stenosed.  Dist LAD lesion is 40% stenosed.  A drug-eluting stent was successfully placed.  Post intervention, there is a 0% residual stenosis.  A drug-eluting stent was successfully placed.  1.  One-vessel coronary artery disease with 90% stenosis mid LAD 2.  Normal left ventricular function 3.  Successful PCI, with overlapping DES mid LAD Recommendations 1.  Dual antiplatelet therapy uninterrupted for 1 year 2.  Aggressive risk factor modification 3.  Likely discharge home in a.m.   DG Chest Portable 1 View  Result Date: 03/09/2020 CLINICAL DATA:  Status post cardiac catheterization, chest pain EXAM: PORTABLE CHEST 1 VIEW COMPARISON:  03/05/2020 FINDINGS: Lungs are clear. No pneumothorax or pleural effusion. Previously noted retrocardiac mass has resolved, likely artifactual on prior examination given absence of segment and findings on subsequent CT examination of 03/06/2020. Cardiac size within normal limits. Pulmonary vascularity is normal. IMPRESSION: No active disease. Electronically Signed   By: Fidela Salisbury MD   On: 03/09/2020 22:10   ECHOCARDIOGRAM COMPLETE  Result Date: 03/07/2020    ECHOCARDIOGRAM REPORT   Patient Name:   Amanda Wiley Date of Exam: 03/06/2020 Medical Rec #:  [id number]      Height:       65.0 in Accession #:    [id number]     Weight:       220.2 lb Date of Birth:  07-06-1955     BSA:          2.061 m Patient Age:    73 years       BP:           104/57 mmHg Patient Gender: F              HR:           63 bpm. Exam Location:  [facility] Procedure: 2D Echo, Cardiac  Doppler and Color Doppler Indications:     NSTEMI I21.4  History:         Patient has prior history of Echocardiogram examinations. Risk                  Factors:Hypertension and Diabetes.  Sonographer:     Alyse Low Roar Referring Phys:  [id number] [place] Diagnosing Phys: Bartholome Bill MD IMPRESSIONS  1. Left ventricular ejection fraction, by estimation, is 55 to 60%. The left ventricle has normal function. The left ventricle has no regional wall motion abnormalities. Left ventricular diastolic parameters were normal.  2. Right ventricular systolic function is normal. The right ventricular size is normal.  3. Left atrial size was mildly dilated.  4. The mitral valve is grossly normal. Trivial mitral valve regurgitation.  5. The aortic valve is grossly normal. Aortic valve regurgitation is not visualized. FINDINGS  Left Ventricle: Left ventricular ejection fraction, by estimation, is 55 to 60%. The left ventricle has normal function. The left ventricle has no regional wall motion abnormalities. The left ventricular internal cavity size was normal in size. There is  borderline left ventricular hypertrophy. Left ventricular diastolic parameters were normal. Right Ventricle: The right ventricular size is normal. No increase in right ventricular wall thickness. Right ventricular systolic function is normal. Left Atrium: Left atrial size was mildly dilated. Right Atrium: Right atrial size was normal  in size. Pericardium: There is no evidence of pericardial effusion. Mitral Valve: The mitral valve is grossly normal. Trivial mitral valve regurgitation. Tricuspid Valve: The tricuspid valve is grossly normal. Tricuspid valve regurgitation is trivial. Aortic Valve: The aortic valve is grossly normal. Aortic valve regurgitation is not visualized. Aortic valve peak gradient measures 7.8 mmHg. Pulmonic Valve: The pulmonic valve was not well visualized. Pulmonic valve regurgitation is trivial. Aorta: The aortic root is normal in  size and structure. IAS/Shunts: The atrial septum is grossly normal.  LEFT VENTRICLE PLAX 2D LVIDd:         4.71 cm Diastology LVIDs:         3.26 cm LV e' medial:    5.98 cm/s LV PW:         0.93 cm LV E/e' medial:  16.0 LV IVS:        1.12 cm LV e' lateral:   9.36 cm/s                        LV E/e' lateral: 10.2  RIGHT VENTRICLE RV Mid diam:    2.76 cm RV S prime:     11.70 cm/s TAPSE (M-mode): 1.9 cm LEFT ATRIUM             Index       RIGHT ATRIUM           Index LA diam:        3.90 cm 1.89 cm/m  RA Area:     12.80 cm LA Vol (A2C):   62.6 ml 30.38 ml/m RA Volume:   29.90 ml  14.51 ml/m LA Vol (A4C):   47.0 ml 22.81 ml/m LA Biplane Vol: 54.4 ml 26.40 ml/m  AORTIC VALVE              PULMONIC VALVE AV Vmax:      140.00 cm/s PV Vmax:        0.92 m/s AV Peak Grad: 7.8 mmHg    PV Peak grad:   3.4 mmHg LVOT Vmax:    104.00 cm/s RVOT Peak grad: 2 mmHg  AORTA Ao Root diam: 2.70 cm MITRAL VALVE MV Area (PHT): 3.08 cm MV Decel Time: 246 msec MV E velocity: 95.50 cm/s MV A velocity: 77.10 cm/s MV E/A ratio:  1.24 MV A Prime:    8.2 cm/s Bartholome Bill MD Electronically signed by Bartholome Bill MD Signature Date/Time: 03/07/2020/7:30:49 AM    Final     EKG: Normal sinus rhythm nonspecific ST-T wave changes  ASSESSMENT AND PLAN:  Unstable angina Post MI angina Status post PCI and stent to LAD with DES Hypertension Obesity Hyperlipidemia Elevated troponins . Plan Agree with rule out myocardial infarction Follow-up troponins Consider repeat echocardiogram for assessment left ventricular function Recommend continue aspirin prasugrel metoprolol losartan and statin therapy Recommend cardiac cath to reevaluate coronary stent Would consider additional medical therapy for angina and/or spasm Recommend add Imdur and possibly amlodipine If stent is patent as we suspect we will arrange discharge home within 24 hours and aggressive medical therapy  Signed: Yolonda Kida MD 03/10/2020, 2:07 PM

## 2020-03-10 NOTE — Progress Notes (Signed)
Patient arrived from cath lab. Dr. Clayborn Bigness spoke to patient and patient's husband regarding procedure. Stents patent, spasms noted. Per provider will adjust medications. Patient can be discharge, but wants to stay overnight:  Per patient "I'm scared to go home" Vitals stable, afebrile, no pain at this time. Right femora mynx closure, ok to sit up at 1730  Per Dr. Clayborn Bigness : will let patient make decision regarding admission.

## 2020-03-10 NOTE — OR Nursing (Signed)
Discussed with Dr Clayborn Bigness. Plan to access right groin. Report received from ED nurse Tammy Jude. Dr Clayborn Bigness aware of am Lovenox, ASA and Effient.

## 2020-03-10 NOTE — Progress Notes (Signed)
Anticoagulation monitoring(Lovenox):   64 yo female ordered Lovenox 40 mg Q24h  Filed Weights   03/09/20 2113  Weight: 102 kg (224 lb 13.9 oz)   BMI 37.4    Lab Results  Component Value Date   CREATININE 0.88 03/09/2020   CREATININE 0.83 03/08/2020   CREATININE 0.83 03/07/2020   Estimated Creatinine Clearance: 75.5 mL/min (by C-G formula based on SCr of 0.88 mg/dL). Hemoglobin & Hematocrit     Component Value Date/Time   HGB 14.3 03/09/2020 2216   HGB 14.8 04/09/2013 1348   HCT 43.6 03/09/2020 2216   HCT 44.4 04/09/2013 1348     Per Protocol for Patient with estCrcl > 30 ml/min and BMI > 30, will transition to Lovenox 50 mg Q24h.

## 2020-03-10 NOTE — ED Notes (Signed)
Pt assisted to bedside commode without incident.

## 2020-03-10 NOTE — ED Notes (Signed)
Ambulated to commode in room, pt tolerated well.  No reports of pain or SOB.   O2 sats 93% RA.  Reports intermittent pain in chest 3/10,  pt offered morphine, refused.

## 2020-03-11 ENCOUNTER — Encounter: Payer: Self-pay | Admitting: Internal Medicine

## 2020-03-11 ENCOUNTER — Telehealth: Payer: Self-pay

## 2020-03-11 LAB — BASIC METABOLIC PANEL
Anion gap: 13 (ref 5–15)
BUN: 25 mg/dL — ABNORMAL HIGH (ref 8–23)
CO2: 24 mmol/L (ref 22–32)
Calcium: 9.2 mg/dL (ref 8.9–10.3)
Chloride: 101 mmol/L (ref 98–111)
Creatinine, Ser: 0.96 mg/dL (ref 0.44–1.00)
GFR, Estimated: >60 mL/min (ref >60)
Glucose, Bld: 237 mg/dL — ABNORMAL HIGH (ref 70–99)
Potassium: 4.1 mmol/L (ref 3.5–5.1)
Sodium: 138 mmol/L (ref 135–145)

## 2020-03-11 LAB — CULTURE, BLOOD (ROUTINE X 2)
Culture: NO GROWTH
Special Requests: ADEQUATE

## 2020-03-11 LAB — GLUCOSE, CAPILLARY
Glucose-Capillary: 124 mg/dL — ABNORMAL HIGH (ref 70–99)
Glucose-Capillary: 211 mg/dL — ABNORMAL HIGH (ref 70–99)

## 2020-03-11 MED ORDER — METOPROLOL TARTRATE 25 MG PO TABS
12.5000 mg | ORAL_TABLET | Freq: Two times a day (BID) | ORAL | 0 refills | Status: DC
Start: 2020-03-11 — End: 2020-07-27

## 2020-03-11 MED ORDER — NITROGLYCERIN 0.4 MG SL SUBL: 0.4000 mg | SUBLINGUAL_TABLET | SUBLINGUAL | 0 refills | Status: AC | PRN

## 2020-03-11 MED ORDER — ISOSORBIDE MONONITRATE ER 30 MG PO TB24: 30.0000 mg | ORAL_TABLET | Freq: Every day | ORAL | 0 refills | Status: AC

## 2020-03-11 MED ORDER — AMLODIPINE BESYLATE 5 MG PO TABS
5.0000 mg | ORAL_TABLET | Freq: Every day | ORAL | 0 refills | Status: DC
Start: 2020-03-11 — End: 2020-08-12

## 2020-03-11 NOTE — Discharge Instructions (Signed)

## 2020-03-11 NOTE — Discharge Summary (Signed)
[facility] [place] NAME: Amanda Wiley    MR#:  [id number]  DATE OF BIRTH:  27-Dec-1955  DATE OF ADMISSION:  03/09/2020 ADMITTING PHYSICIAN: Loletha Grayer, MD  DATE OF DISCHARGE: 03/11/2020 11:26 AM  PRIMARY CARE PHYSICIAN: Einar Pheasant, MD    ADMISSION DIAGNOSIS:  Atypical chest pain [R07.89] Chest pain [R07.9]  DISCHARGE DIAGNOSIS:  Principal Problem:   Chest pain Active Problems:   Diabetes mellitus (HCC)   Acid reflux   Essential hypertension   CD (Crohn's disease) ([place])   CAD (coronary artery disease), native coronary artery   Bipolar 1 disorder ([place])   SECONDARY DIAGNOSIS:   Past Medical History:  Diagnosis Date   Arthritis    Asthma    Complication of anesthesia    HARD TO WAKE UP   COPD (chronic obstructive pulmonary disease) ([place])    Crohn's disease ([place])    Depression    Diabetes mellitus ([place])    Fracture, foot    post fall   Hypercholesterolemia    Hypertension    IBS (irritable bowel syndrome)    Nephrolithiasis    s/p lithotripsy ([facility])   Vitamin D deficiency     HOSPITAL COURSE:   1.  Chest pain with recent NSTEMI.  Troponin was elevated in the 400s but flat.  Patient had continued chest pain so cardiology took back to the cardiac Cath Lab on 03/11/2019 plan for relook procedure.  Stents were patent.  Continue aspirin, Crestor, Effient and metoprolol.  The patient has had a history with muscle pains with high-dose statins and is on low-dose Crestor at this point.  Low-dose Norvasc added for coronary vasospasm and Imdur added. 2.  Type 2 diabetes mellitus on Lantus insulin and Trulicity insulin.  Holding Metformin for now.  Can go back on oral medications and hopefully can restart Metformin with follow-up appointment. 3.  Bipolar disorder.  Continue psychiatric medications 4.  Asthma.  Continue inhalers. 5.  Recent positive blood culture 1 set with staph hominis likely  contamination other set was negative. 6.  Essential hypertension on losartan and metoprolol.  Low-dose Norvasc and Imdur added.  DISCHARGE CONDITIONS:  Satisfactory  CONSULTS OBTAINED:  Cardiology  DRUG ALLERGIES:   Allergies  Allergen Reactions   Avelox [Moxifloxacin Hcl In Nacl] Hives, Shortness Of Breath and Other (See Comments)   Moxifloxacin Hives and Shortness Of Breath   5-Alpha Reductase Inhibitors    Allegra [Fexofenadine Hcl] Other (See Comments)    headaches   Ibuprofen Swelling    FACIAL   Sulfa Antibiotics Other (See Comments)    Difficulty breathing, rash, tongue swelling   Tegretol [Carbamazepine] Other (See Comments)    Other reaction(s): Other (See Comments) Vomiting Vomiting    Iodine Rash   Ioxaglate Rash   Ivp Dye [Iodinated Diagnostic Agents] Rash    DISCHARGE MEDICATIONS:   Allergies as of 03/11/2020      Reactions   Avelox [moxifloxacin Hcl In Nacl] Hives, Shortness Of Breath, Other (See Comments)   Moxifloxacin Hives, Shortness Of Breath   5-alpha Reductase Inhibitors    Allegra [fexofenadine Hcl] Other (See Comments)   headaches   Ibuprofen Swelling   FACIAL   Sulfa Antibiotics Other (See Comments)   Difficulty breathing, rash, tongue swelling   Tegretol [carbamazepine] Other (See Comments)   Other reaction(s): Other (See Comments) Vomiting Vomiting   Iodine Rash   Ioxaglate Rash   Ivp Dye [iodinated Diagnostic Agents] Rash  Medication List    STOP taking these medications   metFORMIN 500 MG tablet Commonly known as: GLUCOPHAGE     TAKE these medications   acetaminophen 325 MG tablet Commonly known as: TYLENOL Take 2 tablets (650 mg total) by mouth every 6 (six) hours as needed for mild pain (or Fever >/= 101).   albuterol (2.5 MG/3ML) 0.083% nebulizer solution Commonly known as: PROVENTIL Take 2.5 mg by nebulization every 6 (six) hours as needed for wheezing.   amLODipine 5 MG tablet Commonly known as:  NORVASC Take 1 tablet (5 mg total) by mouth daily.   ascorbic acid 1000 MG tablet Commonly known as: VITAMIN C Take 1 tablet (1,000 mg total) by mouth daily.   aspirin 81 MG chewable tablet Chew 1 tablet (81 mg total) by mouth daily.   buPROPion 300 MG 24 hr tablet Commonly known as: WELLBUTRIN XL Take 1 tablet (300 mg total) by mouth daily.   fluticasone-salmeterol 230-21 MCG/ACT inhaler Commonly known as: ADVAIR HFA Inhale 2 puffs into the lungs 2 (two) times daily.   hyoscyamine 0.125 MG SL tablet Commonly known as: LEVSIN SL Place 0.125 mg under the tongue as needed.   insulin glargine 100 UNIT/ML injection Commonly known as: LANTUS Inject 20 Units into the skin at bedtime.   ipratropium 0.06 % nasal spray Commonly known as: ATROVENT Place 2 sprays into both nostrils 3 (three) times daily as needed for rhinitis.   Ipratropium-Albuterol 20-100 MCG/ACT Aers respimat Commonly known as: COMBIVENT Inhale 2 puffs into the lungs every 4 (four) hours while awake. What changed:   how much to take  when to take this  additional instructions   isosorbide mononitrate 30 MG 24 hr tablet Commonly known as: IMDUR Take 1 tablet (30 mg total) by mouth daily.   Jardiance 25 MG Tabs tablet Generic drug: empagliflozin Take 25 mg by mouth daily.   losartan 25 MG tablet Commonly known as: Cozaar Take 1 tablet (25 mg total) by mouth daily.   metoprolol tartrate 25 MG tablet Commonly known as: LOPRESSOR Take 0.5 tablets (12.5 mg total) by mouth 2 (two) times daily. What changed: how much to take   nitroGLYCERIN 0.4 MG SL tablet Commonly known as: NITROSTAT Place 1 tablet (0.4 mg total) under the tongue every 5 (five) minutes as needed for chest pain.   prasugrel 10 MG Tabs tablet Commonly known as: EFFIENT Take 1 tablet (10 mg total) by mouth daily.   RABEprazole 20 MG tablet Commonly known as: ACIPHEX Take 1 tablet (20 mg total) by mouth in the morning and at  bedtime.   rosuvastatin 5 MG tablet Commonly known as: Crestor Take 1 tablet (5 mg total) by mouth daily.   Trulicity 1.5 GU/5.4YH Sopn Generic drug: Dulaglutide Inject 1.5 mg into the skin once a week. ON SUNDAYS   [facility] Pen Needles 32G X 4 MM Misc Generic drug: Insulin Pen Needle        DISCHARGE INSTRUCTIONS:   Follow-up PCP 5 days Keep cardiology appointment for Wednesday  If you experience worsening of your admission symptoms, develop shortness of breath, life threatening emergency, suicidal or homicidal thoughts you must seek medical attention immediately by calling 911 or calling your MD immediately  if symptoms less severe.  You Must read complete instructions/literature along with all the possible adverse reactions/side effects for all the Medicines you take and that have been prescribed to you. Take any new Medicines after you have completely understood and accept all the possible adverse  reactions/side effects.   Please note  You were cared for by a hospitalist during your hospital stay. If you have any questions about your discharge medications or the care you received while you were in the hospital after you are discharged, you can call the unit and asked to speak with the hospitalist on call if the hospitalist that took care of you is not available. Once you are discharged, your primary care physician will handle any further medical issues. Please note that NO REFILLS for any discharge medications will be authorized once you are discharged, as it is imperative that you return to your primary care physician (or establish a relationship with a primary care physician if you do not have one) for your aftercare needs so that they can reassess your need for medications and monitor your lab values.    Today   CHIEF COMPLAINT:   Chief Complaint  Patient presents with   Chest Pain   Back Pain    HISTORY OF PRESENT ILLNESS:  Amanda Wiley  is a 64 y.o. female came  in with chest pain and back pain   VITAL SIGNS:  Blood pressure (!) 107/52, pulse 70, temperature 98 F (36.7 C), temperature source Oral, resp. rate 16, height 5' 5"  (1.651 m), weight 98.2 kg, SpO2 93 %.  I/O:    Intake/Output Summary (Last 24 hours) at 03/11/2020 1614 Last data filed at 03/11/2020 0530 Gross per 24 hour  Intake 1620 ml  Output 300 ml  Net 1320 ml    PHYSICAL EXAMINATION:  GENERAL:  64 y.o.-year-old patient lying in the bed with no acute distress.  EYES: Pupils equal, round, reactive to light and accommodation. No scleral icterus. HEENT: Head atraumatic, normocephalic. Oropharynx and nasopharynx clear.  LUNGS: Normal breath sounds bilaterally, no wheezing, rales,rhonchi or crepitation. No use of accessory muscles of respiration.  CARDIOVASCULAR: S1, S2 normal. No murmurs, rubs, or gallops.  ABDOMEN: Soft, non-tender, non-distended.  EXTREMITIES: No pedal edema.  NEUROLOGIC: Cranial nerves II through XII are intact. Muscle strength 5/5 in all extremities. Sensation intact. Gait not checked.  PSYCHIATRIC: The patient is alert and oriented x 3.  SKIN: No obvious rash, lesion, or ulcer.   DATA REVIEW:   CBC Recent Labs  Lab 03/09/20 2216  WBC 10.2  HGB 14.3  HCT 43.6  PLT 178    Chemistries  Recent Labs  Lab 03/08/20 0526 03/09/20 2126 03/11/20 0622  NA 143   < > 138  K 4.1   < > 4.1  CL 109   < > 101  CO2 24   < > 24  GLUCOSE 149*   < > 237*  BUN 18   < > 25*  CREATININE 0.83   < > 0.96  CALCIUM 8.8*   < > 9.2  AST 17  --   --   ALT 23  --   --   ALKPHOS 48  --   --   BILITOT 0.5  --   --    < > = values in this interval not displayed.    Microbiology Results  Results for orders placed or performed during the hospital encounter of 03/09/20  Resp Panel by RT-PCR (Flu A&B, Covid) Nasopharyngeal Swab     Status: None   Collection Time: 03/10/20  5:33 AM   Specimen: Nasopharyngeal Swab; Nasopharyngeal(NP) swabs in vial transport medium   Result Value Ref Range Status   SARS Coronavirus 2 by RT PCR NEGATIVE NEGATIVE Final  Comment: (NOTE) SARS-CoV-2 target nucleic acids are NOT DETECTED.  The SARS-CoV-2 RNA is generally detectable in upper respiratory specimens during the acute phase of infection. The lowest concentration of SARS-CoV-2 viral copies this assay can detect is 138 copies/mL. A negative result does not preclude SARS-Cov-2 infection and should not be used as the sole basis for treatment or other patient management decisions. A negative result may occur with  improper specimen collection/handling, submission of specimen other than nasopharyngeal swab, presence of viral mutation(s) within the areas targeted by this assay, and inadequate number of viral copies(<138 copies/mL). A negative result must be combined with clinical observations, patient history, and epidemiological information. The expected result is Negative.  Fact Sheet for Patients:  EntrepreneurPulse.com.au  Fact Sheet for Healthcare Providers:  IncredibleEmployment.be  This test is no t yet approved or cleared by the [place] FDA and  has been authorized for detection and/or diagnosis of SARS-CoV-2 by FDA under an Emergency Use Authorization (EUA). This EUA will remain  in effect (meaning this test can be used) for the duration of the COVID-19 declaration under Section 564(b)(1) of the Act, 21 U.S.C.section 360bbb-3(b)(1), unless the authorization is terminated  or revoked sooner.       Influenza A by PCR NEGATIVE NEGATIVE Final   Influenza B by PCR NEGATIVE NEGATIVE Final    Comment: (NOTE) The Xpert Xpress SARS-CoV-2/FLU/RSV plus assay is intended as an aid in the diagnosis of influenza from Nasopharyngeal swab specimens and should not be used as a sole basis for treatment. Nasal washings and aspirates are unacceptable for Xpert Xpress SARS-CoV-2/FLU/RSV testing.  Fact Sheet for  Patients: EntrepreneurPulse.com.au  Fact Sheet for Healthcare Providers: IncredibleEmployment.be  This test is not yet approved or cleared by the [place] FDA and has been authorized for detection and/or diagnosis of SARS-CoV-2 by FDA under an Emergency Use Authorization (EUA). This EUA will remain in effect (meaning this test can be used) for the duration of the COVID-19 declaration under Section 564(b)(1) of the Act, 21 U.S.C. section 360bbb-3(b)(1), unless the authorization is terminated or revoked.  Performed at [facility], [address] [place], [place] [address]     RADIOLOGY:  CARDIAC CATHETERIZATION  Result Date: 03/10/2020  Normal coronaries no obstructive disease  Widely patent LAD stent  Normal right coronary artery  Normal LV function  Possible microvascular disease with significant anginal symptoms doing coronary injections with ST changes  Conclusion Normal coronaries with widely patent LAD stent Normal left ventricular function Anginal symptoms with coronary injections including ST segment depression Consideration for microvascular disease We will treat with Imdur possibly amlodipine consider Ranexa if symptoms persist Patient should maintain other current medication including aspirin prasugrel metoprolol losartan Patient should be safe for discharge within the next 24 hours Refer the patient to cardiac rehab Follow-up with Dr. Ubaldo Glassing as an outpatient   DG Chest Portable 1 View  Result Date: 03/09/2020 CLINICAL DATA:  Status post cardiac catheterization, chest pain EXAM: PORTABLE CHEST 1 VIEW COMPARISON:  03/05/2020 FINDINGS: Lungs are clear. No pneumothorax or pleural effusion. Previously noted retrocardiac mass has resolved, likely artifactual on prior examination given absence of segment and findings on subsequent CT examination of 03/06/2020. Cardiac size within normal limits. Pulmonary vascularity is normal.  IMPRESSION: No active disease. Electronically Signed   By: Fidela Salisbury MD   On: 03/09/2020 22:10      Management plans discussed with the patient, family and they are in agreement.  CODE STATUS:  Code Status Orders  (From admission, onward)         Start     Ordered   03/09/20 2359  Full code  Continuous        03/10/20 0000        Code Status History    Date Active Date Inactive Code Status Order ID Comments User Context   03/06/2020 0247 03/08/2020 1612 Full Code [id number]  Lang Snow, [place] Inpatient   03/06/2020 0046 03/06/2020 0247 DNR [id number]  Christel Mormon, MD ED   02/03/2019 1800 02/10/2019 1946 DNR [id number]  Cherene Altes, MD Inpatient   02/03/2019 1350 02/03/2019 1507 DNR [id number]  Flora Lipps, MD Inpatient   02/03/2019 0042 02/03/2019 1350 Full Code [id number]  Henreitta Leber, MD Inpatient   01/12/2019 2132 01/19/2019 1921 Full Code [id number]  Phillips Grout, MD Inpatient   Advance Care Planning Activity      TOTAL TIME TAKING CARE OF THIS PATIENT: 33 minutes.    Loletha Grayer M.D on 03/11/2020 at 4:14 PM  Between 7am to 6pm - Pager - [phone]  After 6pm go to [email or web address] - password [email or web address]  [facility]  CC: Primary care physician; Einar Pheasant, MD

## 2020-03-11 NOTE — Telephone Encounter (Signed)
Pt would like a call today.

## 2020-03-11 NOTE — Telephone Encounter (Signed)
May need TCM. You can use the 12:00 slots if no appts are available

## 2020-03-11 NOTE — Telephone Encounter (Addendum)
Transition Care Management Follow-up Telephone Call  Date of discharge and from where: 03/08/20 from [facility]. 03/10/20 from [facility].  How have you been since you were released from the hospital? I feel better than I did before. Just tired. Denies chest pain, blurred vision, headache and all other symptoms.   Any questions or concerns? No  Items Reviewed:  Did the pt receive and understand the discharge instructions provided? Yes , increase activity as tolerated and encouraged to move around.   Medications obtained and verified? Yes   Noted to stop metformin. Effient 37m daily, Crestor 517mdaily, Metoprolol 2512mwice daily, Nitroglycerin prn for chest pain, Isosorbide 98m22mily, Norvasc 5mg 46mly.   Any new allergies since your discharge? No   Dietary orders reviewed? Low sodium/heart healthy  Do you have support at home? Yes   Home Care and Equipment/Supplies: Were home health services ordered? No Were any new equipment or medical supplies ordered? No  Functional Questionnaire: (I = Independent and D = Dependent) ADLs: i  Bathing/Dressing- i  Meal Prep- i  Eating- i  Maintaining continence- i  Transferring/Ambulation- i  Managing Meds- i  Follow up appointments reviewed:   PCP Hospital f/u appt confirmed? Yes  Scheduled to see Dr. ScottNicki Reaper2/6/21 @ 12:00.  [facility] Hospitalappt confirmed? Yes  Scheduled to see Cardiology on 03/15/20 @ 11:00. Cardiac rehab to call back and schedule.   Are transportation arrangements needed? No   If their condition worsens, is the pt aware to call PCP or go to the Emergency Dept.? Yes.  Was the patient provided with contact information for the PCP's office or ED? Yes.  Was to pt encouraged to call back with questions or concerns? Yes.

## 2020-03-11 NOTE — Telephone Encounter (Signed)
Pt needs a hospital follow up for 5 days from today. Pt was in there for chest pains. There are no appts available at this time with PCP. Please call back ASAP

## 2020-03-11 NOTE — Telephone Encounter (Signed)
Pt said she needed to be seen soon so I scheduled pt for 03/14/20 @ 12pm for hospital follow up. She said she is so confused about her medications and wants to make sure she is taking them correctly.

## 2020-03-11 NOTE — Telephone Encounter (Signed)
Opened in error

## 2020-03-14 ENCOUNTER — Encounter: Payer: Self-pay | Admitting: Internal Medicine

## 2020-03-14 ENCOUNTER — Other Ambulatory Visit: Payer: Self-pay

## 2020-03-14 ENCOUNTER — Ambulatory Visit (INDEPENDENT_AMBULATORY_CARE_PROVIDER_SITE_OTHER): Payer: Medicare Other | Admitting: Internal Medicine

## 2020-03-14 DIAGNOSIS — E1169 Type 2 diabetes mellitus with other specified complication: Secondary | ICD-10-CM

## 2020-03-14 DIAGNOSIS — J4520 Mild intermittent asthma, uncomplicated: Secondary | ICD-10-CM

## 2020-03-14 DIAGNOSIS — K5090 Crohn's disease, unspecified, without complications: Secondary | ICD-10-CM

## 2020-03-14 DIAGNOSIS — K219 Gastro-esophageal reflux disease without esophagitis: Secondary | ICD-10-CM

## 2020-03-14 DIAGNOSIS — I25119 Atherosclerotic heart disease of native coronary artery with unspecified angina pectoris: Secondary | ICD-10-CM

## 2020-03-14 DIAGNOSIS — E7800 Pure hypercholesterolemia, unspecified: Secondary | ICD-10-CM

## 2020-03-14 DIAGNOSIS — E1159 Type 2 diabetes mellitus with other circulatory complications: Secondary | ICD-10-CM

## 2020-03-14 DIAGNOSIS — I10 Essential (primary) hypertension: Secondary | ICD-10-CM | POA: Diagnosis not present

## 2020-03-14 DIAGNOSIS — F320 Major depressive disorder, single episode, mild: Secondary | ICD-10-CM

## 2020-03-14 DIAGNOSIS — R0989 Other specified symptoms and signs involving the circulatory and respiratory systems: Secondary | ICD-10-CM

## 2020-03-14 DIAGNOSIS — F32A Depression, unspecified: Secondary | ICD-10-CM

## 2020-03-14 DIAGNOSIS — I152 Hypertension secondary to endocrine disorders: Secondary | ICD-10-CM

## 2020-03-14 DIAGNOSIS — E669 Obesity, unspecified: Secondary | ICD-10-CM

## 2020-03-14 DIAGNOSIS — Z794 Long term (current) use of insulin: Secondary | ICD-10-CM

## 2020-03-14 NOTE — Patient Instructions (Signed)
CBC and met b

## 2020-03-14 NOTE — Progress Notes (Signed)
[name], female   DOB: [birth year], 64 y.o.   MRN: [id number]   Subjective:    [name], female    DOB: 29-Jan-1956, 64 y.o.   MRN: [id number]  HPI This visit occurred during the SARS-CoV-2 public health emergency.  Safety protocols were in place, including screening questions prior to the visit, additional usage of staff PPE, and extensive cleaning of exam room while observing appropriate contact time as indicated for disinfecting solutions.  Patient here for hospital follow up.  Admitted 03/09/20 - 03/11/20 with chest pain s/p recent NSTEMI.  Had just been admitted 03/05/20 with NSTEMI - cardiac cath PCI to LAD on 03/07/20.  Recommended continued aspirin and plavix x 1 year.  Given her presentation on 12/1 with continued chest pain, she was taken back to cath lab on 03/10/20.  Stents - patent.  Recommended continuing aspirin, crestor, effient and metoprolol. norvasc and imdur added.  Has f/u with cardiology tomorrow.  Planning for cardiac rehab.  No further chest pain since discharge.  Breathing stable.  No increased cough or congestion.  No acid reflux reported.  No abdominal pain.  Bowels moving.  Hematoma right wrist.  Blood sugar am - 170s.    Past Medical History:  Diagnosis Date  . Arthritis   . Asthma   . Complication of anesthesia    HARD TO WAKE UP  . COPD (chronic obstructive pulmonary disease) ([place])   . Crohn's disease ([place])   . Depression   . Diabetes mellitus ([place])   . Fracture, foot    post fall  . Hypercholesterolemia   . Hypertension   . IBS (irritable bowel syndrome)   . Nephrolithiasis    s/p lithotripsy [facility])  . Vitamin D deficiency    Past Surgical History:  Procedure Laterality Date  . ABDOMINAL HYSTERECTOMY  1985  . APPENDECTOMY  1985  . BREAST DUCTAL SYSTEM EXCISION Left 09/04/2019   Procedure: EXCISION DUCTAL SYSTEM BREAST;  Surgeon: Robert Bellow, MD;  Location: [facility] ORS;  Service: General;  Laterality:  Left;  . BREAST EXCISIONAL BIOPSY Right 2013   benign  . COLONOSCOPY  2014  . COLONOSCOPY WITH PROPOFOL N/A 02/10/2020   Procedure: COLONOSCOPY WITH PROPOFOL;  Surgeon: Robert Bellow, MD;  Location: [facility] ENDOSCOPY;  Service: Endoscopy;  Laterality: N/A;  . CORONARY STENT INTERVENTION N/A 03/07/2020   Procedure: CORONARY STENT INTERVENTION;  Surgeon: Isaias Cowman, MD;  Location: [facility];  Service: Cardiovascular;  Laterality: N/A;  . EXCISION / BIOPSY BREAST / NIPPLE / DUCT Left 2019   papilloma  . EXCISION OF BREAST BIOPSY Left 2019   papilloma  . LAPAROSCOPIC CHOLECYSTECTOMY  2005   Dr Tamala Julian  . LAPAROSCOPIC SUPRACERVICAL HYSTERECTOMY  1985   left ovary not removed  . LEFT HEART CATH AND CORONARY ANGIOGRAPHY N/A 03/07/2020   Procedure: LEFT HEART CATH AND CORONARY ANGIOGRAPHY;  Surgeon: Isaias Cowman, MD;  Location: [facility] [facility];  Service: Cardiovascular;  Laterality: N/A;  . LEFT HEART CATH AND CORONARY ANGIOGRAPHY N/A 03/10/2020   Procedure: LEFT HEART CATH AND CORONARY ANGIOGRAPHY and possible PCI and stent;  Surgeon: Yolonda Kida, MD;  Location: [place] [facility];  Service: Cardiovascular;  Laterality: N/A;  . LITHOTRIPSY     x7  . OOPHORECTOMY    . POLYPECTOMY    . SHOULDER SURGERY     right (pinning)  . SHOULDER SURGERY     left (pinning)  . TONSILLECTOMY  1966   Family History  Problem Relation Age of Onset  . Heart disease Father   . Emphysema Maternal Grandmother   . COPD Mother   . Hypercholesterolemia Mother   . Emphysema Mother   . Hypertension Brother   . Emphysema Other        grandmother  . COPD Sister   . Crohn's disease Sister   . Hypertension Sister   . Breast cancer Neg Hx   . Colon cancer Neg Hx    Social History   Socioeconomic History  . Marital status: Married    Spouse name: Not on file  . Number of children: 2  . Years of education: Not on file  . Highest education level: Not on file   Occupational History    Employer: [employer]  Tobacco Use  . Smoking status: Former Smoker    Packs/day: 2.00    Years: 15.00    Pack years: 30.00    Quit date: 04/09/1997    Years since quitting: 22.9  . Smokeless tobacco: Never Used  Vaping Use  . Vaping Use: Never used  Substance and Sexual Activity  . Alcohol use: Never    Alcohol/week: 0.0 standard drinks  . Drug use: No  . Sexual activity: Not on file  Other Topics Concern  . Not on file  Social History Narrative  . Not on file   Social Determinants of Health   Financial Resource Strain: Not on file  Food Insecurity: Not on file  Transportation Needs: Not on file  Physical Activity: Not on file  Stress: Not on file  Social Connections: Not on file    Outpatient Encounter Medications as of 03/14/2020  Medication Sig  . acetaminophen (TYLENOL) 325 MG tablet Take 2 tablets (650 mg total) by mouth every 6 (six) hours as needed for mild pain (or Fever >/= 101).  Marland Kitchen albuterol (PROVENTIL) (2.5 MG/3ML) 0.083% nebulizer solution Take 2.5 mg by nebulization every 6 (six) hours as needed for wheezing.  Marland Kitchen amLODipine (NORVASC) 5 MG tablet Take 1 tablet (5 mg total) by mouth daily.  Marland Kitchen aspirin 81 MG chewable tablet Chew 1 tablet (81 mg total) by mouth daily.  Marland Kitchen buPROPion (WELLBUTRIN XL) 300 MG 24 hr tablet Take 1 tablet (300 mg total) by mouth daily.  . Dulaglutide (TRULICITY) 1.5 YC/1.4GY SOPN Inject 1.5 mg into the skin once a week. ON SUNDAYS  . fluticasone-salmeterol (ADVAIR HFA) 230-21 MCG/ACT inhaler Inhale 2 puffs into the lungs 2 (two) times daily.  . hyoscyamine (LEVSIN SL) 0.125 MG SL tablet Place 0.125 mg under the tongue as needed.  . insulin glargine (LANTUS) 100 UNIT/ML injection Inject 20 Units into the skin at bedtime.   Marland Kitchen ipratropium (ATROVENT) 0.06 % nasal spray Place 2 sprays into both nostrils 3 (three) times daily as needed for rhinitis.   . Ipratropium-Albuterol (COMBIVENT) 20-100 MCG/ACT AERS respimat  Inhale 2 puffs into the lungs every 4 (four) hours while awake. (Patient taking differently: Inhale 1 puff into the lungs 2 (two) times daily. And as needed as a rescue inhaler.)  . isosorbide mononitrate (IMDUR) 30 MG 24 hr tablet Take 1 tablet (30 mg total) by mouth daily.  Marland Kitchen JARDIANCE 25 MG TABS tablet Take 25 mg by mouth daily.  Marland Kitchen losartan (COZAAR) 25 MG tablet Take 1 tablet (25 mg total) by mouth daily.  . metoprolol tartrate (LOPRESSOR) 25 MG tablet Take 0.5 tablets (12.5 mg total) by mouth 2 (two) times daily.  . nitroGLYCERIN (NITROSTAT)  0.4 MG SL tablet Place 1 tablet (0.4 mg total) under the tongue every 5 (five) minutes as needed for chest pain.  . prasugrel (EFFIENT) 10 MG TABS tablet Take 1 tablet (10 mg total) by mouth daily.  . RABEprazole (ACIPHEX) 20 MG tablet Take 1 tablet (20 mg total) by mouth in the morning and at bedtime.  . rosuvastatin (CRESTOR) 5 MG tablet Take 1 tablet (5 mg total) by mouth daily.  Marland Kitchen ULTRACARE PEN NEEDLES 32G X 4 MM MISC   . vitamin C (VITAMIN C) 1000 MG tablet Take 1 tablet (1,000 mg total) by mouth daily.   No facility-administered encounter medications on file as of 03/14/2020.    Review of Systems  Constitutional: Negative for appetite change and unexpected weight change.  HENT: Negative for congestion and sinus pressure.   Respiratory: Negative for cough, chest tightness and shortness of breath.   Cardiovascular: Negative for chest pain, palpitations and leg swelling.  Gastrointestinal: Negative for abdominal pain, diarrhea, nausea and vomiting.  Genitourinary: Negative for difficulty urinating and dysuria.  Musculoskeletal: Negative for joint swelling and myalgias.  Skin: Negative for color change and rash.       Hematoma right wrist.   Neurological: Negative for dizziness, light-headedness and headaches.  Psychiatric/Behavioral: Negative for agitation and dysphoric mood.       Objective:    Physical Exam Vitals reviewed.   Constitutional:      General: She is not in acute distress.    Appearance: Normal appearance.  HENT:     Head: Normocephalic and atraumatic.     Right Ear: External ear normal.     Left Ear: External ear normal.     Mouth/Throat:     Mouth: Oropharynx is clear and moist.  Eyes:     General: No scleral icterus.       Right eye: No discharge.        Left eye: No discharge.     Conjunctiva/sclera: Conjunctivae normal.  Neck:     Thyroid: No thyromegaly.  Cardiovascular:     Rate and Rhythm: Normal rate and regular rhythm.     Comments: Bruit - right groin.   Pulmonary:     Effort: No respiratory distress.     Breath sounds: Normal breath sounds. No wheezing.  Abdominal:     General: Bowel sounds are normal.     Palpations: Abdomen is soft.     Tenderness: There is no abdominal tenderness.  Musculoskeletal:        General: No swelling, tenderness or edema.     Cervical back: Neck supple. No tenderness.  Lymphadenopathy:     Cervical: No cervical adenopathy.  Skin:    Findings: No erythema or rash.     Comments: Hematoma - right wrist.  No increased warmth.    Neurological:     Mental Status: She is alert.  Psychiatric:        Mood and Affect: Mood normal.        Behavior: Behavior normal.     BP 122/62   Pulse 90   Temp 98.2 F (36.8 C)   Ht _0  (1.651 m)   Wt 219 lb 12.8 oz (99.7 kg)   SpO2 94%   BMI 36.58 kg/m  Wt Readings from Last 3 Encounters:  03/14/20 219 lb 12.8 oz (99.7 kg)  03/11/20 216 lb 7.9 oz (98.2 kg)  03/08/20 224 lb 12.8 oz (102 kg)     Lab Results  Component Value Date  WBC 10.2 03/09/2020   HGB 14.3 03/09/2020   HCT 43.6 03/09/2020   PLT 178 03/09/2020   GLUCOSE 237 (H) 03/11/2020   CHOL 222 (H) 02/12/2020   TRIG 330.0 (H) 02/12/2020   HDL 40.50 02/12/2020   LDLDIRECT 149.0 02/12/2020   LDLCALC 148 (H) 07/24/2013   ALT 23 03/08/2020   AST 17 03/08/2020   NA 138 03/11/2020   K 4.1 03/11/2020   CL 101 03/11/2020   CREATININE  0.96 03/11/2020   BUN 25 (H) 03/11/2020   CO2 24 03/11/2020   TSH 2.15 06/02/2019   INR 0.9 03/06/2020   HGBA1C 7.7 (H) 02/12/2020   MICROALBUR 0.7 06/02/2019    CARDIAC CATHETERIZATION  Result Date: 03/10/2020  Normal coronaries no obstructive disease  Widely patent LAD stent  Normal right coronary artery  Normal LV function  Possible microvascular disease with significant anginal symptoms doing coronary injections with ST changes  Conclusion Normal coronaries with widely patent LAD stent Normal left ventricular function Anginal symptoms with coronary injections including ST segment depression Consideration for microvascular disease We will treat with Imdur possibly amlodipine consider Ranexa if symptoms persist Patient should maintain other current medication including aspirin prasugrel metoprolol losartan Patient should be safe for discharge within the next 24 hours Refer the patient to cardiac rehab Follow-up with Dr. Ubaldo Glassing as an outpatient   DG Chest Portable 1 View  Result Date: 03/09/2020 CLINICAL DATA:  Status post cardiac catheterization, chest pain EXAM: PORTABLE CHEST 1 VIEW COMPARISON:  03/05/2020 FINDINGS: Lungs are clear. No pneumothorax or pleural effusion. Previously noted retrocardiac mass has resolved, likely artifactual on prior examination given absence of segment and findings on subsequent CT examination of 03/06/2020. Cardiac size within normal limits. Pulmonary vascularity is normal. IMPRESSION: No active disease. Electronically Signed   By: Fidela Salisbury MD   On: 03/09/2020 22:10       Assessment & Plan:   Problem List Items Addressed This Visit    Obesity, diabetes, and hypertension syndrome ([place])    Diet, exercise and weight loss.  Follow.       Mild depression ([place])    Continue on wellbutrin.  Stable.       Hypertension    Continue metoprolol.  Also on imdur and amlodipine.  Blood pressure on recheck as outlined.  Continue current medication regimen.   Follow pressures.  Follow metabolic panel.        Hypercholesterolemia without hypertriglyceridemia    On low dose crestor.  Low cholesterol diet and exercise.  Follow lipid panel and liver function tests.        GERD (gastroesophageal reflux disease)    Continue aciphex.       Femoral bruit    Audible bruit.  Notified cardiology.  appt tomorrow.  They will f/u.        Diabetes mellitus ([place])    Followed by Dr Gabriel Carina.  Sugars as outlined.  Back on metformin.  Follow met b and a1c.       Crohn's disease ([place])    Bowels stable.  Continue f/u with GI       CAD (coronary artery disease), native coronary artery    S/p recent NSTEMI.  S/p PCI - LAD.  Continue of aspirin, plavix, crestor and metoprolol.  No pain now.  Has f/u with cardiology tomorrow.       Asthma    Continue advair, combivent and singulair.  Breathing stable.  Follow.  Refoel Palladino, MD 

## 2020-03-20 ENCOUNTER — Encounter: Payer: Self-pay | Admitting: Internal Medicine

## 2020-03-20 DIAGNOSIS — R0989 Other specified symptoms and signs involving the circulatory and respiratory systems: Secondary | ICD-10-CM | POA: Insufficient documentation

## 2020-03-20 NOTE — Assessment & Plan Note (Signed)
Audible bruit.  Notified cardiology.  appt tomorrow.  They will f/u.

## 2020-03-20 NOTE — Assessment & Plan Note (Signed)
Continue metoprolol.  Also on imdur and amlodipine.  Blood pressure on recheck as outlined.  Continue current medication regimen.  Follow pressures.  Follow metabolic panel.

## 2020-03-20 NOTE — Assessment & Plan Note (Signed)
Continue on wellbutrin.  Stable.

## 2020-03-20 NOTE — Assessment & Plan Note (Signed)
Followed by Dr Gabriel Carina.  Sugars as outlined.  Back on metformin.  Follow met b and a1c.

## 2020-03-20 NOTE — Assessment & Plan Note (Addendum)
S/p recent NSTEMI.  S/p PCI - LAD.  Continue of aspirin, plavix, crestor and metoprolol.  No pain now.  Has f/u with cardiology tomorrow. Discussed cardiac rehab.

## 2020-03-20 NOTE — Assessment & Plan Note (Signed)
Diet, exercise and weight loss.  Follow.

## 2020-03-20 NOTE — Assessment & Plan Note (Signed)
On low dose crestor.  Low cholesterol diet and exercise.  Follow lipid panel and liver function tests.

## 2020-03-20 NOTE — Assessment & Plan Note (Signed)
Continue advair, combivent and singulair.  Breathing stable.  Follow.

## 2020-03-20 NOTE — Assessment & Plan Note (Signed)
Bowels stable.  Continue f/u with GI

## 2020-03-20 NOTE — Assessment & Plan Note (Signed)
Continue aciphex.

## 2020-03-21 ENCOUNTER — Encounter: Payer: Medicare Other | Attending: Cardiology

## 2020-03-21 ENCOUNTER — Other Ambulatory Visit: Payer: Self-pay

## 2020-03-21 DIAGNOSIS — Z955 Presence of coronary angioplasty implant and graft: Secondary | ICD-10-CM | POA: Insufficient documentation

## 2020-03-21 DIAGNOSIS — I214 Non-ST elevation (NSTEMI) myocardial infarction: Secondary | ICD-10-CM | POA: Insufficient documentation

## 2020-03-21 NOTE — Progress Notes (Signed)
Virtual Visit completed. Patient informed on EP and RD appointment and 6 Minute walk test. Patient also informed of patient health questionnaires on My Chart. Patient Verbalizes understanding. Visit diagnosis can be found in [facility]12/10/2019.

## 2020-03-22 VITALS — Ht 65.5 in | Wt 219.3 lb

## 2020-03-22 DIAGNOSIS — Z955 Presence of coronary angioplasty implant and graft: Secondary | ICD-10-CM | POA: Diagnosis not present

## 2020-03-22 DIAGNOSIS — I214 Non-ST elevation (NSTEMI) myocardial infarction: Secondary | ICD-10-CM

## 2020-03-22 NOTE — Patient Instructions (Signed)
Patient Instructions  Patient Details  Name: Amanda Wiley MRN: [id number] Date of Birth: 1955/04/24 Referring Provider:  Teodoro Spray, MD  Below are your personal goals for exercise, nutrition, and risk factors. Our goal is to help you stay on track towards obtaining and maintaining these goals. We will be discussing your progress on these goals with you throughout the program.  Initial Exercise Prescription:  Initial Exercise Prescription - 03/22/20 1500      Date of Initial Exercise RX and Referring Provider   Date 03/22/20    Referring Provider Fath      Treadmill   MPH 2    Grade 1    Minutes 15    METs 2.8      Recumbant Bike   Level 1    RPM 60    Watts 38    Minutes 15    METs 3.2      Arm Ergometer   Level 1    RPM 25    Minutes 15    METs 3      REL-XR   Level 2    Speed 50    Minutes 15    METs 3.2      Prescription Details   Frequency (times per week) 3    Duration Progress to 30 minutes of continuous aerobic without signs/symptoms of physical distress      Intensity   THRR 40-80% of Max Heartrate 100-136    Ratings of Perceived Exertion 11-13    Perceived Dyspnea 0-4      Resistance Training   Training Prescription Yes    Weight 3 lb    Reps 10-15           Exercise Goals: Frequency: Be able to perform aerobic exercise two to three times per week in program working toward 2-5 days per week of home exercise.  Intensity: Work with a perceived exertion of 11 (fairly light) - 15 (hard) while following your exercise prescription.  We will make changes to your prescription with you as you progress through the program.   Duration: Be able to do 30 to 45 minutes of continuous aerobic exercise in addition to a 5 minute warm-up and a 5 minute cool-down routine.   Nutrition Goals: Your personal nutrition goals will be established when you do your nutrition analysis with the dietician.  The following are general nutrition guidelines to  follow: Cholesterol < 22m/day Sodium < 15030mday Fiber: Women over 50 yrs - 21 grams per day  Personal Goals:  Personal Goals and Risk Factors at Admission - 03/22/20 1517      Core Components/Risk Factors/Patient Goals on Admission    Weight Management Yes;Weight Loss    Intervention Weight Management: Develop a combined nutrition and exercise program designed to reach desired caloric intake, while maintaining appropriate intake of nutrient and fiber, sodium and fats, and appropriate energy expenditure required for the weight goal.;Weight Management: Provide education and appropriate resources to help participant work on and attain dietary goals.;Weight Management/Obesity: Establish reasonable short term and long term weight goals.;Obesity: Provide education and appropriate resources to help participant work on and attain dietary goals.    Expected Outcomes Short Term: Continue to assess and modify interventions until short term weight is achieved;Long Term: Adherence to nutrition and physical activity/exercise program aimed toward attainment of established weight goal;Weight Loss: Understanding of general recommendations for a balanced deficit meal plan, which promotes 1-2 lb weight loss per week and includes a negative energy balance  of [phone] kcal/d;Understanding recommendations for meals to include 15-35% energy as protein, 25-35% energy from fat, 35-60% energy from carbohydrates, less than 276m of dietary cholesterol, 20-35 gm of total fiber daily;Understanding of distribution of calorie intake throughout the day with the consumption of 4-5 meals/snacks    Diabetes Yes    Intervention Provide education about signs/symptoms and action to take for hypo/hyperglycemia.;Provide education about proper nutrition, including hydration, and aerobic/resistive exercise prescription along with prescribed medications to achieve blood glucose in normal ranges: Fasting glucose 65-99 mg/dL    Expected  Outcomes Short Term: Participant verbalizes understanding of the signs/symptoms and immediate care of hyper/hypoglycemia, proper foot care and importance of medication, aerobic/resistive exercise and nutrition plan for blood glucose control.;Long Term: Attainment of HbA1C < 7%.    Hypertension Yes    Intervention Provide education on lifestyle modifcations including regular physical activity/exercise, weight management, moderate sodium restriction and increased consumption of fresh fruit, vegetables, and low fat dairy, alcohol moderation, and smoking cessation.;Monitor prescription use compliance.    Expected Outcomes Short Term: Continued assessment and intervention until BP is < 140/92mHG in hypertensive participants. < 130/809mG in hypertensive participants with diabetes, heart failure or chronic kidney disease.;Long Term: Maintenance of blood pressure at goal levels.    Lipids Yes    Intervention Provide education and support for participant on nutrition & aerobic/resistive exercise along with prescribed medications to achieve LDL <67m68mDL >40mg29m Expected Outcomes Long Term: Cholesterol controlled with medications as prescribed, with individualized exercise RX and with personalized nutrition plan. Value goals: LDL < 67mg,8m > 40 mg.;Short Term: Participant states understanding of desired cholesterol values and is compliant with medications prescribed. Participant is following exercise prescription and nutrition guidelines.           Tobacco Use Initial Evaluation: Social History   Tobacco Use  Smoking Status Former Smoker  . Packs/day: 2.00  . Years: 15.00  . Pack years: 30.00  . Types: Cigarettes  . Quit date: 04/09/1997  . Years since quitting: 22.9  Smokeless Tobacco Never Used    Exercise Goals and Review:  Exercise Goals    Row Name 03/22/20 1510             Exercise Goals   Increase Physical Activity Yes       Intervention Provide advice, education, support and  counseling about physical activity/exercise needs.;Develop an individualized exercise prescription for aerobic and resistive training based on initial evaluation findings, risk stratification, comorbidities and participant's personal goals.       Expected Outcomes Short Term: Attend rehab on a regular basis to increase amount of physical activity.;Long Term: Add in home exercise to make exercise part of routine and to increase amount of physical activity.;Long Term: Exercising regularly at least 3-5 days a week.       Increase Strength and Stamina Yes       Intervention Provide advice, education, support and counseling about physical activity/exercise needs.;Develop an individualized exercise prescription for aerobic and resistive training based on initial evaluation findings, risk stratification, comorbidities and participant's personal goals.       Expected Outcomes Short Term: Increase workloads from initial exercise prescription for resistance, speed, and METs.;Short Term: Perform resistance training exercises routinely during rehab and add in resistance training at home;Long Term: Improve cardiorespiratory fitness, muscular endurance and strength as measured by increased METs and functional capacity (6MWT)       Able to understand and use rate of perceived exertion (RPE) scale  Yes       Intervention Provide education and explanation on how to use RPE scale       Expected Outcomes Short Term: Able to use RPE daily in rehab to express subjective intensity level;Long Term:  Able to use RPE to guide intensity level when exercising independently       Able to understand and use Dyspnea scale Yes       Intervention Provide education and explanation on how to use Dyspnea scale       Expected Outcomes Short Term: Able to use Dyspnea scale daily in rehab to express subjective sense of shortness of breath during exertion;Long Term: Able to use Dyspnea scale to guide intensity level when exercising independently        Knowledge and understanding of Target Heart Rate Range (THRR) Yes       Intervention Provide education and explanation of THRR including how the numbers were predicted and where they are located for reference       Expected Outcomes Short Term: Able to state/look up THRR;Short Term: Able to use daily as guideline for intensity in rehab;Long Term: Able to use THRR to govern intensity when exercising independently       Able to check pulse independently Yes       Intervention Provide education and demonstration on how to check pulse in carotid and radial arteries.;Review the importance of being able to check your own pulse for safety during independent exercise       Expected Outcomes Short Term: Able to explain why pulse checking is important during independent exercise;Long Term: Able to check pulse independently and accurately       Understanding of Exercise Prescription Yes       Intervention Provide education, explanation, and written materials on patient's individual exercise prescription       Expected Outcomes Short Term: Able to explain program exercise prescription;Long Term: Able to explain home exercise prescription to exercise independently              Copy of goals given to participant.

## 2020-03-22 NOTE — Progress Notes (Signed)
Cardiac Individual Treatment Plan  Patient Details  Name: Amanda Wiley MRN: [id number] Date of Birth: [birth year] Referring Provider:   Flowsheet Row Cardiac Rehab from 03/22/2020 in [facility] Cardiac and Pulmonary Rehab  Referring Provider Fath      Initial Encounter Date:  Flowsheet Row Cardiac Rehab from 03/22/2020 in [facility] Cardiac and Pulmonary Rehab  Date 03/22/20      Visit Diagnosis: NSTEMI (non-ST elevated myocardial infarction) [facility])  Patient's Home Medications on Admission:  Current Outpatient Medications:    acetaminophen (TYLENOL) 325 MG tablet, Take 2 tablets (650 mg total) by mouth every 6 (six) hours as needed for mild pain (or Fever >/= 101)., Disp:  , Rfl:    albuterol (PROVENTIL) (2.5 MG/3ML) 0.083% nebulizer solution, Take 2.5 mg by nebulization every 6 (six) hours as needed for wheezing., Disp: , Rfl:    amLODipine (NORVASC) 5 MG tablet, Take 1 tablet (5 mg total) by mouth daily., Disp: 30 tablet, Rfl: 0   amLODipine (NORVASC) 5 MG tablet, Take 1 tablet by mouth daily., Disp: , Rfl:    aspirin 81 MG chewable tablet, Chew 1 tablet (81 mg total) by mouth daily., Disp: 30 tablet, Rfl: 0   aspirin 81 MG chewable tablet, Chew 1 tablet by mouth daily., Disp: , Rfl:    buPROPion (WELLBUTRIN XL) 300 MG 24 hr tablet, Take 1 tablet (300 mg total) by mouth daily., Disp: 90 tablet, Rfl: 1   Dulaglutide (TRULICITY) 1.5 TK/1.6WF SOPN, Inject 1.5 mg into the skin once a week. ON SUNDAYS, Disp: , Rfl:    fluticasone-salmeterol (ADVAIR HFA) 230-21 MCG/ACT inhaler, Inhale 2 puffs into the lungs 2 (two) times daily., Disp: , Rfl:    hyoscyamine (LEVSIN SL) 0.125 MG SL tablet, Place 0.125 mg under the tongue as needed., Disp: , Rfl:    insulin glargine (LANTUS) 100 UNIT/ML injection, Inject 20 Units into the skin at bedtime. , Disp: , Rfl:    ipratropium (ATROVENT) 0.06 % nasal spray, Place 2 sprays into both nostrils 3 (three) times daily as needed for rhinitis.  (Patient not  taking: Reported on 03/21/2020), Disp: , Rfl:    Ipratropium-Albuterol (COMBIVENT) 20-100 MCG/ACT AERS respimat, Inhale 2 puffs into the lungs every 4 (four) hours while awake. (Patient taking differently: Inhale 1 puff into the lungs 2 (two) times daily. And as needed as a rescue inhaler.), Disp: 4 g, Rfl: 2   isosorbide mononitrate (IMDUR) 30 MG 24 hr tablet, Take 1 tablet (30 mg total) by mouth daily., Disp: 30 tablet, Rfl: 0   JARDIANCE 25 MG TABS tablet, Take 25 mg by mouth daily., Disp: , Rfl:    losartan (COZAAR) 25 MG tablet, Take 1 tablet (25 mg total) by mouth daily., Disp: 30 tablet, Rfl: 0   metoprolol tartrate (LOPRESSOR) 25 MG tablet, Take 0.5 tablets (12.5 mg total) by mouth 2 (two) times daily., Disp: 30 tablet, Rfl: 0   nitroGLYCERIN (NITROSTAT) 0.4 MG SL tablet, Place 1 tablet (0.4 mg total) under the tongue every 5 (five) minutes as needed for chest pain., Disp: 30 tablet, Rfl: 0   prasugrel (EFFIENT) 10 MG TABS tablet, Take 1 tablet (10 mg total) by mouth daily., Disp: 30 tablet, Rfl: 0   RABEprazole (ACIPHEX) 20 MG tablet, Take 1 tablet (20 mg total) by mouth in the morning and at bedtime., Disp: 180 tablet, Rfl: 1   rosuvastatin (CRESTOR) 5 MG tablet, Take 1 tablet (5 mg total) by mouth daily., Disp: 30 tablet, Rfl: 0   ULTRACARE  PEN NEEDLES 32G X 4 MM MISC, , Disp: , Rfl:    vitamin C (VITAMIN C) 1000 MG tablet, Take 1 tablet (1,000 mg total) by mouth daily., Disp:  , Rfl:   Past Medical History: Past Medical History:  Diagnosis Date   Arthritis    Asthma    Complication of anesthesia    HARD TO WAKE UP   COPD (chronic obstructive pulmonary disease) ([place])    Crohn's disease ([place])    Depression    Diabetes mellitus ([place])    Fracture, foot    post fall   Hypercholesterolemia    Hypertension    IBS (irritable bowel syndrome)    Nephrolithiasis    s/p lithotripsy [facility])   Vitamin D deficiency     Tobacco Use: Social  History   Tobacco Use  Smoking Status Former Smoker   Packs/day: 2.00   Years: 15.00   Pack years: 30.00   Types: Cigarettes   Quit date: 04/09/1997   Years since quitting: 22.9  Smokeless Tobacco Never Used    Labs: Recent Chemical engineer    Labs for ITP Cardiac and Pulmonary Rehab Latest Ref Rng & Units 01/13/2019 02/02/2019 06/02/2019 10/14/2019 02/12/2020   Cholestrol 0 - 200 mg/dL - - 208(H) 210(H) 222(H)   LDLCALC 0 - 99 mg/dL - - - - -   LDLDIRECT mg/dL - - 120.0 128.0 149.0   HDL >39.00 mg/dL - - 39.20 41.20 40.50   Trlycerides 0.0 - 149.0 mg/dL - - 396.0(H) 324.0(H) 330.0(H)   Hemoglobin A1c 4.6 - 6.5 % 7.3(H) - 7.0(H) 7.6(H) 7.7(H)   PHART 7.350 - 7.450 - 7.50(H) - - -   PCO2ART 32.0 - 48.0 mmHg - 39 - - -   HCO3 20.0 - 28.0 mmol/L - 30.4(H) - - -   O2SAT % - 92.8 - - -       Exercise Target Goals: Exercise Program Goal: Individual exercise prescription set using results from initial 6 min walk test and THRR while considering  patients activity barriers and safety.   Exercise Prescription Goal: Initial exercise prescription builds to 30-45 minutes a day of aerobic activity, 2-3 days per week.  Home exercise guidelines will be given to patient during program as part of exercise prescription that the participant will acknowledge.   Education: Aerobic Exercise: - Group verbal and visual presentation on the components of exercise prescription. Introduces F.I.T.T principle from ACSM for exercise prescriptions.  Reviews F.I.T.T. principles of aerobic exercise including progression. Written material given at graduation.   Education: Resistance Exercise: - Group verbal and visual presentation on the components of exercise prescription. Introduces F.I.T.T principle from ACSM for exercise prescriptions  Reviews F.I.T.T. principles of resistance exercise including progression. Written material given at graduation.    Education: Exercise & Equipment Safety: -  Individual verbal instruction and demonstration of equipment use and safety with use of the equipment. Flowsheet Row Cardiac Rehab from 03/22/2020 in [facility] Cardiac and Pulmonary Rehab  Date 03/22/20  Educator AS  Instruction Review Code 1- Verbalizes Understanding      Education: Exercise Physiology & General Exercise Guidelines: - Group verbal and written instruction with models to review the exercise physiology of the cardiovascular system and associated critical values. Provides general exercise guidelines with specific guidelines to those with heart or lung disease.    Education: Flexibility, Balance, Mind/Body Relaxation: - Group verbal and visual presentation with interactive activity on the components of exercise prescription. Introduces F.I.T.T principle from ACSM for  exercise prescriptions. Reviews F.I.T.T. principles of flexibility and balance exercise training including progression. Also discusses the mind body connection.  Reviews various relaxation techniques to help reduce and manage stress (i.e. Deep breathing, progressive muscle relaxation, and visualization). Balance handout provided to take home. Written material given at graduation.   Activity Barriers & Risk Stratification:   6 Minute Walk:  6 Minute Walk    Row Name 03/22/20 1503         6 Minute Walk   Phase Initial     Distance 1170 feet     Walk Time 6 minutes     # of Rest Breaks 0     MPH 2.2     METS 3.2     RPE 8     Perceived Dyspnea  1     VO2 Peak 11.27     Symptoms Yes (comment)     Comments slightly SOB     Resting HR 64 bpm     Resting BP 138/68     Resting Oxygen Saturation  95 %     Exercise Oxygen Saturation  during 6 min walk 91 %     Max Ex. HR 113 bpm     Max Ex. BP 208/62     2 Minute Post BP 124/68            Oxygen Initial Assessment:   Oxygen Re-Evaluation:   Oxygen Discharge (Final Oxygen Re-Evaluation):   Initial Exercise Prescription:  Initial Exercise  Prescription - 03/22/20 1500      Date of Initial Exercise RX and Referring Provider   Date 03/22/20    Referring Provider Fath      Treadmill   MPH 2    Grade 1    Minutes 15    METs 2.8      Recumbant Bike   Level 1    RPM 60    Watts 38    Minutes 15    METs 3.2      Arm Ergometer   Level 1    RPM 25    Minutes 15    METs 3      REL-XR   Level 2    Speed 50    Minutes 15    METs 3.2      Prescription Details   Frequency (times per week) 3    Duration Progress to 30 minutes of continuous aerobic without signs/symptoms of physical distress      Intensity   THRR 40-80% of Max Heartrate 100-136    Ratings of Perceived Exertion 11-13    Perceived Dyspnea 0-4      Resistance Training   Training Prescription Yes    Weight 3 lb    Reps 10-15           Perform Capillary Blood Glucose checks as needed.  Exercise Prescription Changes:  Exercise Prescription Changes    Row Name 03/22/20 1500             Response to Exercise   Blood Pressure (Admit) 138/68       Blood Pressure (Exercise) 208/62       Blood Pressure (Exit) 124/68       Heart Rate (Admit) 64 bpm       Heart Rate (Exercise) 113 bpm       Heart Rate (Exit) 84 bpm       Oxygen Saturation (Admit) 95 %       Oxygen Saturation (Exercise) 91 %  Oxygen Saturation (Exit) 96 %       Rating of Perceived Exertion (Exercise) 8       Perceived Dyspnea (Exercise) 1       Symptoms SOB              Exercise Comments:   Exercise Goals and Review:  Exercise Goals    Row Name 03/22/20 1510             Exercise Goals   Increase Physical Activity Yes       Intervention Provide advice, education, support and counseling about physical activity/exercise needs.;Develop an individualized exercise prescription for aerobic and resistive training based on initial evaluation findings, risk stratification, comorbidities and participant's personal goals.       Expected Outcomes Short Term: Attend  rehab on a regular basis to increase amount of physical activity.;Long Term: Add in home exercise to make exercise part of routine and to increase amount of physical activity.;Long Term: Exercising regularly at least 3-5 days a week.       Increase Strength and Stamina Yes       Intervention Provide advice, education, support and counseling about physical activity/exercise needs.;Develop an individualized exercise prescription for aerobic and resistive training based on initial evaluation findings, risk stratification, comorbidities and participant's personal goals.       Expected Outcomes Short Term: Increase workloads from initial exercise prescription for resistance, speed, and METs.;Short Term: Perform resistance training exercises routinely during rehab and add in resistance training at home;Long Term: Improve cardiorespiratory fitness, muscular endurance and strength as measured by increased METs and functional capacity (6MWT)       Able to understand and use rate of perceived exertion (RPE) scale Yes       Intervention Provide education and explanation on how to use RPE scale       Expected Outcomes Short Term: Able to use RPE daily in rehab to express subjective intensity level;Long Term:  Able to use RPE to guide intensity level when exercising independently       Able to understand and use Dyspnea scale Yes       Intervention Provide education and explanation on how to use Dyspnea scale       Expected Outcomes Short Term: Able to use Dyspnea scale daily in rehab to express subjective sense of shortness of breath during exertion;Long Term: Able to use Dyspnea scale to guide intensity level when exercising independently       Knowledge and understanding of Target Heart Rate Range (THRR) Yes       Intervention Provide education and explanation of THRR including how the numbers were predicted and where they are located for reference       Expected Outcomes Short Term: Able to state/look up  THRR;Short Term: Able to use daily as guideline for intensity in rehab;Long Term: Able to use THRR to govern intensity when exercising independently       Able to check pulse independently Yes       Intervention Provide education and demonstration on how to check pulse in carotid and radial arteries.;Review the importance of being able to check your own pulse for safety during independent exercise       Expected Outcomes Short Term: Able to explain why pulse checking is important during independent exercise;Long Term: Able to check pulse independently and accurately       Understanding of Exercise Prescription Yes       Intervention Provide education, explanation, and written materials on  patient's individual exercise prescription       Expected Outcomes Short Term: Able to explain program exercise prescription;Long Term: Able to explain home exercise prescription to exercise independently              Exercise Goals Re-Evaluation :   Discharge Exercise Prescription (Final Exercise Prescription Changes):  Exercise Prescription Changes - 03/22/20 1500      Response to Exercise   Blood Pressure (Admit) 138/68    Blood Pressure (Exercise) 208/62    Blood Pressure (Exit) 124/68    Heart Rate (Admit) 64 bpm    Heart Rate (Exercise) 113 bpm    Heart Rate (Exit) 84 bpm    Oxygen Saturation (Admit) 95 %    Oxygen Saturation (Exercise) 91 %    Oxygen Saturation (Exit) 96 %    Rating of Perceived Exertion (Exercise) 8    Perceived Dyspnea (Exercise) 1    Symptoms SOB           Nutrition:  Target Goals: Understanding of nutrition guidelines, daily intake of sodium <1530m, cholesterol <2086m calories 30% from fat and 7% or less from saturated fats, daily to have 5 or more servings of fruits and vegetables.  Education: All About Nutrition: -Group instruction provided by verbal, written material, interactive activities, discussions, models, and posters to present general guidelines for  heart healthy nutrition including fat, fiber, MyPlate, the role of sodium in heart healthy nutrition, utilization of the nutrition label, and utilization of this knowledge for meal planning. Follow up email sent as well. Written material given at graduation.   Biometrics:  Pre Biometrics - 03/22/20 1511      Pre Biometrics   Height 5' 5.5" (1.664 m)    Weight 219 lb 4.8 oz (99.5 kg)    BMI (Calculated) 35.93    Single Leg Stand 5 seconds            Nutrition Therapy Plan and Nutrition Goals:   Nutrition Assessments:  MEDIFICTS Score Key:  ?70 Need to make dietary changes   40-70 Heart Healthy Diet  ? 40 Therapeutic Level Cholesterol Diet  [facility]  Picture Your Plate Total Score on Admission 32     Picture Your Plate Scores:  <4<66nhealthy dietary pattern with much room for improvement.  41-50 Dietary pattern unlikely to meet recommendations for good health and room for improvement.  51-60 More healthful dietary pattern, with some room for improvement.   >60 Healthy dietary pattern, although there may be some specific behaviors that could be improved.    Nutrition Goals Re-Evaluation:   Nutrition Goals Discharge (Final Nutrition Goals Re-Evaluation):   Psychosocial: Target Goals: Acknowledge presence or absence of significant depression and/or stress, maximize coping skills, provide positive support system. Participant is able to verbalize types and ability to use techniques and skills needed for reducing stress and depression.   Education: Stress, Anxiety, and Depression - Group verbal and visual presentation to define topics covered.  Reviews how body is impacted by stress, anxiety, and depression.  Also discusses healthy ways to reduce stress and to treat/manage anxiety and depression.  Written material given at graduation.   Education: Sleep Hygiene -Provides group verbal and written  instruction about how sleep can affect your health.  Define sleep hygiene, discuss sleep cycles and impact of sleep habits. Review good sleep hygiene tips.    Initial Review & Psychosocial Screening:  Initial Psych Review & Screening - 03/21/20  1344      Initial Review   Current issues with Current Psychotropic Meds;Current Anxiety/Panic      Family Dynamics   Good Support System? Yes    Comments She sometimes gets anxiety with the heart issues she has gone through. She can look to her husband and her four puppies.      Barriers   Psychosocial barriers to participate in program The patient should benefit from training in stress management and relaxation.      Screening Interventions   Interventions Encouraged to exercise;To provide support and resources with identified psychosocial needs;Provide feedback about the scores to participant    Expected Outcomes Short Term goal: Utilizing psychosocial counselor, staff and physician to assist with identification of specific Stressors or current issues interfering with healing process. Setting desired goal for each stressor or current issue identified.;Long Term Goal: Stressors or current issues are controlled or eliminated.;Short Term goal: Identification and review with participant of any Quality of Life or Depression concerns found by scoring the questionnaire.;Long Term goal: The participant improves quality of Life and PHQ9 Scores as seen by post scores and/or verbalization of changes           Quality of Life Scores:   Quality of Life - 03/22/20 1513      Quality of Life   Select Quality of Life      Quality of Life Scores   Health/Function Pre 23.18 %    Socioeconomic Pre 28.75 %    Psych/Spiritual Pre 24.5 %    Family Pre 27.6 %    GLOBAL Pre 24.97 %          Scores of 19 and below usually indicate a poorer quality of life in these areas.  A difference of  2-3 points is a clinically meaningful difference.  A difference of 2-3  points in the total score of the Quality of Life Index has been associated with significant improvement in overall quality of life, self-image, physical symptoms, and general health in studies assessing change in quality of life.  PHQ-9: Recent Review Flowsheet Data    Depression screen [facility] 2/9 03/22/2020 03/14/2020 01/08/2019 12/20/2016 12/20/2016   Decreased Interest 1 2 0 0 0   Down, Depressed, Hopeless 1 1 0 0 0   PHQ - 2 Score 2 3 0 0 0   Altered sleeping 1 2 0 - 0   Tired, decreased energy 1 2 0 - 1   Change in appetite 1 2 0 - 0   Feeling bad or failure about yourself  1 3 0 - 0   Trouble concentrating 1 2 0 - 0   Moving slowly or fidgety/restless 0 2 0 - 0   Suicidal thoughts 0 0 0 - 0   PHQ-9 Score 7 16 0 - 1   Difficult doing work/chores Not difficult at all - Not difficult at all - Not difficult at all     Interpretation of Total Score  Total Score Depression Severity:  1-4 = Minimal depression, 5-9 = Mild depression, 10-14 = Moderate depression, 15-19 = Moderately severe depression, 20-27 = Severe depression   Psychosocial Evaluation and Intervention:  Psychosocial Evaluation - 03/21/20 1346      Psychosocial Evaluation & Interventions   Interventions Encouraged to exercise with the program and follow exercise prescription;Stress management education;Relaxation education    Comments She sometimes gets anxiety with the heart issues she has gone through. She can look to her husband and her four puppies.  Expected Outcomes Short: Exercise regularly to support mental health and notify staff of any changes. Long: maintain mental health and well being through teaching of rehab or prescribed medications independently.    Continue Psychosocial Services  Follow up required by staff           Psychosocial Re-Evaluation:   Psychosocial Discharge (Final Psychosocial Re-Evaluation):   Vocational Rehabilitation: Provide vocational rehab assistance to qualifying candidates.    Vocational Rehab Evaluation & Intervention:   Education: Education Goals: Education classes will be provided on a variety of topics geared toward better understanding of heart health and risk factor modification. Participant will state understanding/return demonstration of topics presented as noted by education test scores.  Learning Barriers/Preferences:  Learning Barriers/Preferences - 03/21/20 1342      Learning Barriers/Preferences   Learning Barriers None    Learning Preferences None           General Cardiac Education Topics:  AED/CPR: - Group verbal and written instruction with the use of models to demonstrate the basic use of the AED with the basic ABC's of resuscitation.   Anatomy and Cardiac Procedures: - Group verbal and visual presentation and models provide information about basic cardiac anatomy and function. Reviews the testing methods done to diagnose heart disease and the outcomes of the test results. Describes the treatment choices: Medical Management, Angioplasty, or Coronary Bypass Surgery for treating various heart conditions including Myocardial Infarction, Angina, Valve Disease, and Cardiac Arrhythmias.  Written material given at graduation.   Medication Safety: - Group verbal and visual instruction to review commonly prescribed medications for heart and lung disease. Reviews the medication, class of the drug, and side effects. Includes the steps to properly store meds and maintain the prescription regimen.  Written material given at graduation.   Intimacy: - Group verbal instruction through game format to discuss how heart and lung disease can affect sexual intimacy. Written material given at graduation..   Know Your Numbers and Heart Failure: - Group verbal and visual instruction to discuss disease risk factors for cardiac and pulmonary disease and treatment options.  Reviews associated critical values for Overweight/Obesity, Hypertension, Cholesterol, and  Diabetes.  Discusses basics of heart failure: signs/symptoms and treatments.  Introduces Heart Failure Zone chart for action plan for heart failure.  Written material given at graduation.   Infection Prevention: - Provides verbal and written material to individual with discussion of infection control including proper hand washing and proper equipment cleaning during exercise session. Flowsheet Row Cardiac Rehab from 03/22/2020 in [facility] Cardiac and Pulmonary Rehab  Date 03/22/20  Educator AS  Instruction Review Code 1- Verbalizes Understanding      Falls Prevention: - Provides verbal and written material to individual with discussion of falls prevention and safety. Flowsheet Row Cardiac Rehab from 03/22/2020 in [facility] Cardiac and Pulmonary Rehab  Date 03/22/20  Educator AS  Instruction Review Code 1- Verbalizes Understanding      Other: -Provides group and verbal instruction on various topics (see comments)   Knowledge Questionnaire Score:  Knowledge Questionnaire Score - 03/22/20 1514      Knowledge Questionnaire Score   Pre Score 22/26 angina, nutrition           Core Components/Risk Factors/Patient Goals at Admission:  Personal Goals and Risk Factors at Admission - 03/22/20 1517      Core Components/Risk Factors/Patient Goals on Admission    Weight Management Yes;Weight Loss    Intervention Weight Management: Develop a combined nutrition and exercise program designed to  reach desired caloric intake, while maintaining appropriate intake of nutrient and fiber, sodium and fats, and appropriate energy expenditure required for the weight goal.;Weight Management: Provide education and appropriate resources to help participant work on and attain dietary goals.;Weight Management/Obesity: Establish reasonable short term and long term weight goals.;Obesity: Provide education and appropriate resources to help participant work on and attain dietary goals.    Expected Outcomes Short Term:  Continue to assess and modify interventions until short term weight is achieved;Long Term: Adherence to nutrition and physical activity/exercise program aimed toward attainment of established weight goal;Weight Loss: Understanding of general recommendations for a balanced deficit meal plan, which promotes 1-2 lb weight loss per week and includes a negative energy balance of [phone] kcal/d;Understanding recommendations for meals to include 15-35% energy as protein, 25-35% energy from fat, 35-60% energy from carbohydrates, less than 289m of dietary cholesterol, 20-35 gm of total fiber daily;Understanding of distribution of calorie intake throughout the day with the consumption of 4-5 meals/snacks    Diabetes Yes    Intervention Provide education about signs/symptoms and action to take for hypo/hyperglycemia.;Provide education about proper nutrition, including hydration, and aerobic/resistive exercise prescription along with prescribed medications to achieve blood glucose in normal ranges: Fasting glucose 65-99 mg/dL    Expected Outcomes Short Term: Participant verbalizes understanding of the signs/symptoms and immediate care of hyper/hypoglycemia, proper foot care and importance of medication, aerobic/resistive exercise and nutrition plan for blood glucose control.;Long Term: Attainment of HbA1C < 7%.    Hypertension Yes    Intervention Provide education on lifestyle modifcations including regular physical activity/exercise, weight management, moderate sodium restriction and increased consumption of fresh fruit, vegetables, and low fat dairy, alcohol moderation, and smoking cessation.;Monitor prescription use compliance.    Expected Outcomes Short Term: Continued assessment and intervention until BP is < 140/964mHG in hypertensive participants. < 130/8021mG in hypertensive participants with diabetes, heart failure or chronic kidney disease.;Long Term: Maintenance of blood pressure at goal levels.    Lipids  Yes    Intervention Provide education and support for participant on nutrition & aerobic/resistive exercise along with prescribed medications to achieve LDL <65m65mDL >40mg38m Expected Outcomes Long Term: Cholesterol controlled with medications as prescribed, with individualized exercise RX and with personalized nutrition plan. Value goals: LDL < 65mg,65m > 40 mg.;Short Term: Participant states understanding of desired cholesterol values and is compliant with medications prescribed. Participant is following exercise prescription and nutrition guidelines.           Education:Diabetes - Individual verbal and written instruction to review signs/symptoms of diabetes, desired ranges of glucose level fasting, after meals and with exercise. Acknowledge that pre and post exercise glucose checks will be done for 3 sessions at entry of program. [facility]  Date 03/21/20  [facility]- Verbalizes Understanding      Core Components/Risk Factors/Patient Goals Review:    Core Components/Risk Factors/Patient Goals at Discharge (Final Review):    ITP Comments:  ITP Comments    Row [facility]12/13/21 1342           ITP Comments Virtual Visit completed. Patient informed on EP and RD appointment and 6 Minute walk test. Patient also informed of patient health questionnaires on My Chart. Patient Verbalizes understanding. Visit diagnosis can be found in [facility]12/10/2019.              Comments: initial ITP

## 2020-03-28 ENCOUNTER — Other Ambulatory Visit: Payer: Self-pay

## 2020-03-28 DIAGNOSIS — I214 Non-ST elevation (NSTEMI) myocardial infarction: Secondary | ICD-10-CM

## 2020-03-28 LAB — GLUCOSE, CAPILLARY
Glucose-Capillary: 134 mg/dL — ABNORMAL HIGH (ref 70–99)
Glucose-Capillary: 98 mg/dL (ref 70–99)

## 2020-03-28 NOTE — Progress Notes (Signed)
Daily Session Note  Patient Details  Name: Amanda Wiley MRN: [id number] Date of Birth: April 01, 1956 Referring Provider:   Flowsheet Row Cardiac Rehab from 03/22/2020 in [facility] Cardiac and Pulmonary Rehab  Referring Provider Fath      Encounter Date: 03/28/2020  Check In:  Session Check In - 03/28/20 1334      Check-In   Supervising physician immediately available to respond to emergencies See telemetry face sheet for immediately available ER MD    Location [facility]-Cardiac & Pulmonary Rehab    Staff Present Birdie Sons, MPA, [place]icia Area, BS, ACSM CEP, Exercise Physiologist;Kara Eliezer Bottom, MS Exercise Physiologist    Virtual Visit No    Medication changes reported     No    Fall or balance concerns reported    No    Warm-up and Cool-down Performed on first and last piece of equipment    Resistance Training Performed Yes    VAD Patient? No    PAD/SET Patient? No      Pain Assessment   Currently in Pain? No/denies              Social History   Tobacco Use  Smoking Status Former Smoker  . Packs/day: 2.00  . Years: 15.00  . Pack years: 30.00  . Types: Cigarettes  . Quit date: 04/09/1997  . Years since quitting: 22.9  Smokeless Tobacco Never Used    Goals Met:  Independence with exercise equipment Exercise tolerated well No report of cardiac concerns or symptoms Strength training completed today  Goals Unmet:  Not Applicable  Comments: First full day of exercise!  Patient was oriented to gym and equipment including functions, settings, policies, and procedures.  Patient's individual exercise prescription and treatment plan were reviewed.  All starting workloads were established based on the results of the 6 minute walk test done at initial orientation visit.  The plan for exercise progression was also introduced and progression will be customized based on patient's performance and goals.    Dr. Emily Filbert is Medical Director for [place] and [facility].

## 2020-03-30 ENCOUNTER — Other Ambulatory Visit: Payer: Self-pay

## 2020-03-30 DIAGNOSIS — I214 Non-ST elevation (NSTEMI) myocardial infarction: Secondary | ICD-10-CM | POA: Diagnosis not present

## 2020-03-30 LAB — GLUCOSE, CAPILLARY
Glucose-Capillary: 152 mg/dL — ABNORMAL HIGH (ref 70–99)
Glucose-Capillary: 98 mg/dL (ref 70–99)

## 2020-03-30 NOTE — Progress Notes (Signed)
Daily Session Note  Patient Details  Name: Amanda Wiley MRN: [id number] Date of Birth: 12-31-1955 Referring Provider:   Flowsheet Row Cardiac Rehab from 03/22/2020 in [facility] Cardiac and Pulmonary Rehab  Referring Provider Fath      Encounter Date: 03/30/2020  Check In:  Session Check In - 03/30/20 1424      Check-In   Supervising physician immediately available to respond to emergencies See telemetry face sheet for immediately available ER MD    Location [facility]-Cardiac & Pulmonary Rehab    Staff Present Birdie Sons, MPA, RN;Joseph Darrin Nipper, [place], RCEP, CCRP, CCET    Virtual Visit No    Medication changes reported     No    Fall or balance concerns reported    No    Warm-up and Cool-down Performed on first and last piece of equipment    Resistance Training Performed Yes    VAD Patient? No    PAD/SET Patient? No      Pain Assessment   Currently in Pain? No/denies              Social History   Tobacco Use  Smoking Status Former Smoker  . Packs/day: 2.00  . Years: 15.00  . Pack years: 30.00  . Types: Cigarettes  . Quit date: 04/09/1997  . Years since quitting: 22.9  Smokeless Tobacco Never Used    Goals Met:  Independence with exercise equipment Exercise tolerated well No report of cardiac concerns or symptoms Strength training completed today  Goals Unmet:  Not Applicable  Comments: Pt able to follow exercise prescription today without complaint.  Will continue to monitor for progression.    Dr. Emily Filbert is Medical Director for [place] and [facility].

## 2020-03-31 ENCOUNTER — Encounter: Payer: Medicare Other | Admitting: *Deleted

## 2020-03-31 ENCOUNTER — Other Ambulatory Visit: Payer: Self-pay

## 2020-03-31 DIAGNOSIS — I214 Non-ST elevation (NSTEMI) myocardial infarction: Secondary | ICD-10-CM

## 2020-03-31 LAB — GLUCOSE, CAPILLARY
Glucose-Capillary: 151 mg/dL — ABNORMAL HIGH (ref 70–99)
Glucose-Capillary: 104 mg/dL — ABNORMAL HIGH (ref 70–99)

## 2020-03-31 NOTE — Progress Notes (Signed)
Daily Session Note  Patient Details  Name: Amanda Wiley MRN: [id number] Date of Birth: 1956-02-19 Referring Provider:   Flowsheet Row Cardiac Rehab from 03/22/2020 in [facility] Cardiac and Pulmonary Rehab  Referring Provider Fath      Encounter Date: 03/31/2020  Check In:  Session Check In - 03/31/20 1332      Check-In   Supervising physician immediately available to respond to emergencies See telemetry face sheet for immediately available ER MD    Location [facility]-Cardiac & Pulmonary Rehab    Staff Present Renita Papa, RN BSN;Melissa Caiola RDN, LDN;Jessica Luan Pulling, MA, RCEP, CCRP, CCET    Virtual Visit No    Medication changes reported     No    Fall or balance concerns reported    No    Warm-up and Cool-down Performed on first and last piece of equipment    Resistance Training Performed Yes    VAD Patient? No    PAD/SET Patient? No      Pain Assessment   Currently in Pain? No/denies              Social History   Tobacco Use  Smoking Status Former Smoker   Packs/day: 2.00   Years: 15.00   Pack years: 30.00   Types: Cigarettes   Quit date: 04/09/1997   Years since quitting: 22.9  Smokeless Tobacco Never Used    Goals Met:  Independence with exercise equipment Exercise tolerated well No report of cardiac concerns or symptoms Strength training completed today  Goals Unmet:  Not Applicable  Comments: Pt able to follow exercise prescription today without complaint.  Will continue to monitor for progression.    Dr. Emily Filbert is Medical Director for [place] and [facility].

## 2020-04-04 ENCOUNTER — Other Ambulatory Visit: Payer: Self-pay

## 2020-04-04 ENCOUNTER — Encounter: Payer: Medicare Other | Admitting: *Deleted

## 2020-04-04 DIAGNOSIS — I214 Non-ST elevation (NSTEMI) myocardial infarction: Secondary | ICD-10-CM | POA: Diagnosis not present

## 2020-04-04 NOTE — Progress Notes (Signed)
Daily Session Note  Patient Details  Name: Amanda Wiley MRN: [id number] Date of Birth: 25-Nov-1955 Referring Provider:   Flowsheet Row Cardiac Rehab from 03/22/2020 in [facility] Cardiac [facility] Rehab  Referring Provider Fath      Encounter Date: 04/04/2020  Check In:  Session Check In - 04/04/20 1343      Check-In   Supervising physician immediately available to respond to emergencies See telemetry face sheet for immediately available ER MD    Location [facility]-Cardiac & Pulmonary Rehab    Staff Present Renita Papa, RN BSN;Joseph Foy [place], [place], ACSM CEP, Exercise Physiologist;Laureen Owens Shark, BS, RRT, CPFT    Virtual Visit No    Medication changes reported     No    Fall or balance concerns reported    No    Warm-up and Cool-down Performed on first and last piece of equipment    Resistance Training Performed Yes    VAD Patient? No    PAD/SET Patient? No      Pain Assessment   Currently in Pain? No/denies              Social History   Tobacco Use  Smoking Status Former Smoker  . Packs/day: 2.00  . Years: 15.00  . Pack years: 30.00  . Types: Cigarettes  . Quit date: 04/09/1997  . Years since quitting: 23.0  Smokeless Tobacco Never Used    Goals Met:  Independence with exercise equipment Exercise tolerated well No report of cardiac concerns or symptoms Strength training completed today  Goals Unmet:  Not Applicable  Comments: Pt able to follow exercise prescription today without complaint.  Will continue to monitor for progression.    Dr. Emily Filbert is Medical Director for [place] and [facility].

## 2020-04-06 ENCOUNTER — Encounter: Payer: Self-pay | Admitting: *Deleted

## 2020-04-06 DIAGNOSIS — I214 Non-ST elevation (NSTEMI) myocardial infarction: Secondary | ICD-10-CM

## 2020-04-06 NOTE — Progress Notes (Signed)
Cardiac Individual Treatment Plan  Patient Details  Name: KAICEE SCARPINO MRN: [id number] Date of Birth: 1955/05/29 Referring Provider:   Flowsheet Row Cardiac Rehab from 03/22/2020 in [facility] Cardiac and Pulmonary Rehab  Referring Provider Fath      Initial Encounter Date:  Flowsheet Row Cardiac Rehab from 03/22/2020 in [facility] Cardiac and Pulmonary Rehab  Date 03/22/20      Visit Diagnosis: NSTEMI (non-ST elevated myocardial infarction) [facility])  Patient's Home Medications on Admission:  Current Outpatient Medications:  .  acetaminophen (TYLENOL) 325 MG tablet, Take 2 tablets (650 mg total) by mouth every 6 (six) hours as needed for mild pain (or Fever >/= 101)., Disp:  , Rfl:  .  albuterol (PROVENTIL) (2.5 MG/3ML) 0.083% nebulizer solution, Take 2.5 mg by nebulization every 6 (six) hours as needed for wheezing., Disp: , Rfl:  .  amLODipine (NORVASC) 5 MG tablet, Take 1 tablet (5 mg total) by mouth daily., Disp: 30 tablet, Rfl: 0 .  amLODipine (NORVASC) 5 MG tablet, Take 1 tablet by mouth daily., Disp: , Rfl:  .  aspirin 81 MG chewable tablet, Chew 1 tablet (81 mg total) by mouth daily., Disp: 30 tablet, Rfl: 0 .  aspirin 81 MG chewable tablet, Chew 1 tablet by mouth daily., Disp: , Rfl:  .  buPROPion (WELLBUTRIN XL) 300 MG 24 hr tablet, Take 1 tablet (300 mg total) by mouth daily., Disp: 90 tablet, Rfl: 1 .  Dulaglutide (TRULICITY) 1.5 UV/2.5DG SOPN, Inject 1.5 mg into the skin once a week. ON SUNDAYS, Disp: , Rfl:  .  fluticasone-salmeterol (ADVAIR HFA) 230-21 MCG/ACT inhaler, Inhale 2 puffs into the lungs 2 (two) times daily., Disp: , Rfl:  .  hyoscyamine (LEVSIN SL) 0.125 MG SL tablet, Place 0.125 mg under the tongue as needed., Disp: , Rfl:  .  insulin glargine (LANTUS) 100 UNIT/ML injection, Inject 20 Units into the skin at bedtime. , Disp: , Rfl:  .  ipratropium (ATROVENT) 0.06 % nasal spray, Place 2 sprays into both nostrils 3 (three) times daily as needed for rhinitis.  (Patient not  taking: Reported on 03/21/2020), Disp: , Rfl:  .  Ipratropium-Albuterol (COMBIVENT) 20-100 MCG/ACT AERS respimat, Inhale 2 puffs into the lungs every 4 (four) hours while awake. (Patient taking differently: Inhale 1 puff into the lungs 2 (two) times daily. And as needed as a rescue inhaler.), Disp: 4 g, Rfl: 2 .  isosorbide mononitrate (IMDUR) 30 MG 24 hr tablet, Take 1 tablet (30 mg total) by mouth daily., Disp: 30 tablet, Rfl: 0 .  JARDIANCE 25 MG TABS tablet, Take 25 mg by mouth daily., Disp: , Rfl:  .  losartan (COZAAR) 25 MG tablet, Take 1 tablet (25 mg total) by mouth daily., Disp: 30 tablet, Rfl: 0 .  metoprolol tartrate (LOPRESSOR) 25 MG tablet, Take 0.5 tablets (12.5 mg total) by mouth 2 (two) times daily., Disp: 30 tablet, Rfl: 0 .  nitroGLYCERIN (NITROSTAT) 0.4 MG SL tablet, Place 1 tablet (0.4 mg total) under the tongue every 5 (five) minutes as needed for chest pain., Disp: 30 tablet, Rfl: 0 .  prasugrel (EFFIENT) 10 MG TABS tablet, Take 1 tablet (10 mg total) by mouth daily., Disp: 30 tablet, Rfl: 0 .  RABEprazole (ACIPHEX) 20 MG tablet, Take 1 tablet (20 mg total) by mouth in the morning and at bedtime., Disp: 180 tablet, Rfl: 1 .  rosuvastatin (CRESTOR) 5 MG tablet, Take 1 tablet (5 mg total) by mouth daily., Disp: 30 tablet, Rfl: 0 .  ULTRACARE  PEN NEEDLES 32G X 4 MM MISC, , Disp: , Rfl:  .  vitamin C (VITAMIN C) 1000 MG tablet, Take 1 tablet (1,000 mg total) by mouth daily., Disp:  , Rfl:   Past Medical History: Past Medical History:  Diagnosis Date  . Arthritis   . Asthma   . Complication of anesthesia    HARD TO WAKE UP  . COPD (chronic obstructive pulmonary disease) ([place])   . Crohn's disease ([place])   . Depression   . Diabetes mellitus ([place])   . Fracture, foot    post fall  . Hypercholesterolemia   . Hypertension   . IBS (irritable bowel syndrome)   . Nephrolithiasis    s/p lithotripsy [facility])  . Vitamin D deficiency     Tobacco Use: Social  History   Tobacco Use  Smoking Status Former Smoker  . Packs/day: 2.00  . Years: 15.00  . Pack years: 30.00  . Types: Cigarettes  . Quit date: 04/09/1997  . Years since quitting: 23.0  Smokeless Tobacco Never Used    Labs: Recent Chemical engineer    Labs for ITP Cardiac and Pulmonary Rehab Latest Ref Rng & Units 01/13/2019 02/02/2019 06/02/2019 10/14/2019 02/12/2020   Cholestrol 0 - 200 mg/dL - - 208(H) 210(H) 222(H)   LDLCALC 0 - 99 mg/dL - - - - -   LDLDIRECT mg/dL - - 120.0 128.0 149.0   HDL >39.00 mg/dL - - 39.20 41.20 40.50   Trlycerides 0.0 - 149.0 mg/dL - - 396.0(H) 324.0(H) 330.0(H)   Hemoglobin A1c 4.6 - 6.5 % 7.3(H) - 7.0(H) 7.6(H) 7.7(H)   PHART 7.350 - 7.450 - 7.50(H) - - -   PCO2ART 32.0 - 48.0 mmHg - 39 - - -   HCO3 20.0 - 28.0 mmol/L - 30.4(H) - - -   O2SAT % - 92.8 - - -       Exercise Target Goals: Exercise Program Goal: Individual exercise prescription set using results from initial 6 min walk test and THRR while considering  patient's activity barriers and safety.   Exercise Prescription Goal: Initial exercise prescription builds to 30-45 minutes a day of aerobic activity, 2-3 days per week.  Home exercise guidelines will be given to patient during program as part of exercise prescription that the participant will acknowledge.   Education: Aerobic Exercise: - Group verbal and visual presentation on the components of exercise prescription. Introduces F.I.T.T principle from ACSM for exercise prescriptions.  Reviews F.I.T.T. principles of aerobic exercise including progression. Written material given at graduation.   Education: Resistance Exercise: - Group verbal and visual presentation on the components of exercise prescription. Introduces F.I.T.T principle from ACSM for exercise prescriptions  Reviews F.I.T.T. principles of resistance exercise including progression. Written material given at graduation. Flowsheet Row Cardiac Rehab from 03/30/2020 in [facility]  Cardiac and Pulmonary Rehab  Date 03/30/20  Educator AS  Instruction Review Code 1- Verbalizes Understanding       Education: Exercise & Equipment Safety: - Individual verbal instruction and demonstration of equipment use and safety with use of the equipment. Flowsheet Row Cardiac Rehab from 03/30/2020 in [facility] Cardiac and Pulmonary Rehab  Date 03/22/20  Educator AS  Instruction Review Code 1- Verbalizes Understanding      Education: Exercise Physiology & General Exercise Guidelines: - Group verbal and written instruction with models to review the exercise physiology of the cardiovascular system and associated critical values. Provides general exercise guidelines with specific guidelines to those with heart or lung disease.  Education: Flexibility, Balance, Mind/Body Relaxation: - Group verbal and visual presentation with interactive activity on the components of exercise prescription. Introduces F.I.T.T principle from ACSM for exercise prescriptions. Reviews F.I.T.T. principles of flexibility and balance exercise training including progression. Also discusses the mind body connection.  Reviews various relaxation techniques to help reduce and manage stress (i.e. Deep breathing, progressive muscle relaxation, and visualization). Balance handout provided to take home. Written material given at graduation.   Activity Barriers & Risk Stratification:   6 Minute Walk:  6 Minute Walk    Row Name 03/22/20 1503         6 Minute Walk   Phase Initial     Distance 1170 feet     Walk Time 6 minutes     # of Rest Breaks 0     MPH 2.2     METS 3.2     RPE 8     Perceived Dyspnea  1     VO2 Peak 11.27     Symptoms Yes (comment)     Comments slightly SOB     Resting HR 64 bpm     Resting BP 138/68     Resting Oxygen Saturation  95 %     Exercise Oxygen Saturation  during 6 min walk 91 %     Max Ex. HR 113 bpm     Max Ex. BP 208/62     2 Minute Post BP 124/68            Oxygen  Initial Assessment:   Oxygen Re-Evaluation:   Oxygen Discharge (Final Oxygen Re-Evaluation):   Initial Exercise Prescription:  Initial Exercise Prescription - 03/22/20 1500      Date of Initial Exercise RX and Referring Provider   Date 03/22/20    Referring Provider Fath      Treadmill   MPH 2    Grade 1    Minutes 15    METs 2.8      Recumbant Bike   Level 1    RPM 60    Watts 38    Minutes 15    METs 3.2      Arm Ergometer   Level 1    RPM 25    Minutes 15    METs 3      REL-XR   Level 2    Speed 50    Minutes 15    METs 3.2      Prescription Details   Frequency (times per week) 3    Duration Progress to 30 minutes of continuous aerobic without signs/symptoms of physical distress      Intensity   THRR 40-80% of Max Heartrate 100-136    Ratings of Perceived Exertion 11-13    Perceived Dyspnea 0-4      Resistance Training   Training Prescription Yes    Weight 3 lb    Reps 10-15           Perform Capillary Blood Glucose checks as needed.  Exercise Prescription Changes:  Exercise Prescription Changes    Row Name 03/22/20 1500 04/04/20 1600           Response to Exercise   Blood Pressure (Admit) 138/68 122/68      Blood Pressure (Exercise) 208/62 174/78      Blood Pressure (Exit) 124/68 120/66      Heart Rate (Admit) 64 bpm 103 bpm      Heart Rate (Exercise) 113 bpm 117 bpm  Heart Rate (Exit) 84 bpm 81 bpm      Oxygen Saturation (Admit) 95 % --      Oxygen Saturation (Exercise) 91 % --      Oxygen Saturation (Exit) 96 % --      Rating of Perceived Exertion (Exercise) 8 13      Perceived Dyspnea (Exercise) 1 --      Symptoms SOB none      Comments -- fourth session      Duration -- Continue with 30 min of aerobic exercise without signs/symptoms of physical distress.      Intensity -- THRR unchanged             Progression   Progression -- Continue to progress workloads to maintain intensity without signs/symptoms of physical  distress.      Average METs -- 2.45             Resistance Training   Training Prescription -- Yes      Weight -- 3 lb      Reps -- 10-15             Treadmill   MPH -- 1.9      Grade -- 1      Minutes -- 15      METs -- 2.63             REL-XR   Level -- 2      Speed -- 50      Minutes -- 15      METs -- 2.1             Exercise Comments:  Exercise Comments    Row Name 03/28/20 1336           Exercise Comments First full day of exercise!  Patient was oriented to gym and equipment including functions, settings, policies, and procedures.  Patient's individual exercise prescription and treatment plan were reviewed.  All starting workloads were established based on the results of the 6 minute walk test done at initial orientation visit.  The plan for exercise progression was also introduced and progression will be customized based on patient's performance and goals.              Exercise Goals and Review:  Exercise Goals    Row Name 03/22/20 1510             Exercise Goals   Increase Physical Activity Yes       Intervention Provide advice, education, support and counseling about physical activity/exercise needs.;Develop an individualized exercise prescription for aerobic and resistive training based on initial evaluation findings, risk stratification, comorbidities and participant's personal goals.       Expected Outcomes Short Term: Attend rehab on a regular basis to increase amount of physical activity.;Long Term: Add in home exercise to make exercise part of routine and to increase amount of physical activity.;Long Term: Exercising regularly at least 3-5 days a week.       Increase Strength and Stamina Yes       Intervention Provide advice, education, support and counseling about physical activity/exercise needs.;Develop an individualized exercise prescription for aerobic and resistive training based on initial evaluation findings, risk stratification,  comorbidities and participant's personal goals.       Expected Outcomes Short Term: Increase workloads from initial exercise prescription for resistance, speed, and METs.;Short Term: Perform resistance training exercises routinely during rehab and add in resistance training at home;Long Term: Improve cardiorespiratory fitness, muscular endurance and  strength as measured by increased METs and functional capacity (6MWT)       Able to understand and use rate of perceived exertion (RPE) scale Yes       Intervention Provide education and explanation on how to use RPE scale       Expected Outcomes Short Term: Able to use RPE daily in rehab to express subjective intensity level;Long Term:  Able to use RPE to guide intensity level when exercising independently       Able to understand and use Dyspnea scale Yes       Intervention Provide education and explanation on how to use Dyspnea scale       Expected Outcomes Short Term: Able to use Dyspnea scale daily in rehab to express subjective sense of shortness of breath during exertion;Long Term: Able to use Dyspnea scale to guide intensity level when exercising independently       Knowledge and understanding of Target Heart Rate Range (THRR) Yes       Intervention Provide education and explanation of THRR including how the numbers were predicted and where they are located for reference       Expected Outcomes Short Term: Able to state/look up THRR;Short Term: Able to use daily as guideline for intensity in rehab;Long Term: Able to use THRR to govern intensity when exercising independently       Able to check pulse independently Yes       Intervention Provide education and demonstration on how to check pulse in carotid and radial arteries.;Review the importance of being able to check your own pulse for safety during independent exercise       Expected Outcomes Short Term: Able to explain why pulse checking is important during independent exercise;Long Term: Able to  check pulse independently and accurately       Understanding of Exercise Prescription Yes       Intervention Provide education, explanation, and written materials on patient's individual exercise prescription       Expected Outcomes Short Term: Able to explain program exercise prescription;Long Term: Able to explain home exercise prescription to exercise independently              Exercise Goals Re-Evaluation :  Exercise Goals Re-Evaluation    Row Name 03/28/20 1336 04/04/20 1652           Exercise Goal Re-Evaluation   Exercise Goals Review Increase Physical Activity;Able to understand and use rate of perceived exertion (RPE) scale;Knowledge and understanding of Target Heart Rate Range (THRR);Understanding of Exercise Prescription;Increase Strength and Stamina;Able to understand and use Dyspnea scale;Able to check pulse independently Increase Physical Activity;Increase Strength and Stamina      Comments Reviewed RPE and dyspnea scales, THR and program prescription with pt today.  Pt voiced understanding and was given a copy of goals to take home. Delanna has tolerated exercise well in her first sessions.  She has gradually increase TM from 1.34mh/0% to 1.9 mph and 1 %.  She works in [employer].  We will monitor progress.      Expected Outcomes Short: Use RPE daily to regulate intensity. Long: Follow program prescription in THR. Short: attend consistently Long: improve overall stamina             Discharge Exercise Prescription (Final Exercise Prescription Changes):  Exercise Prescription Changes - 04/04/20 1600      Response to Exercise   Blood Pressure (Admit) 122/68    Blood Pressure (Exercise) 174/78    Blood Pressure (  Exit) 120/66    Heart Rate (Admit) 103 bpm    Heart Rate (Exercise) 117 bpm    Heart Rate (Exit) 81 bpm    Rating of Perceived Exertion (Exercise) 13    Symptoms none    Comments fourth session    Duration Continue with 30 min of aerobic exercise without  signs/symptoms of physical distress.    Intensity THRR unchanged      Progression   Progression Continue to progress workloads to maintain intensity without signs/symptoms of physical distress.    Average METs 2.45      Resistance Training   Training Prescription Yes    Weight 3 lb    Reps 10-15      Treadmill   MPH 1.9    Grade 1    Minutes 15    METs 2.63      REL-XR   Level 2    Speed 50    Minutes 15    METs 2.1           Nutrition:  Target Goals: Understanding of nutrition guidelines, daily intake of sodium <1572m, cholesterol <2041m calories 30% from fat and 7% or less from saturated fats, daily to have 5 or more servings of fruits and vegetables.  Education: All About Nutrition: -Group instruction provided by verbal, written material, interactive activities, discussions, models, and posters to present general guidelines for heart healthy nutrition including fat, fiber, MyPlate, the role of sodium in heart healthy nutrition, utilization of the nutrition label, and utilization of this knowledge for meal planning. Follow up email sent as well. Written material given at graduation.   Biometrics:  Pre Biometrics - 03/22/20 1511      Pre Biometrics   Height 5' 5.5" (1.664 m)    Weight 219 lb 4.8 oz (99.5 kg)    BMI (Calculated) 35.93    Single Leg Stand 5 seconds            Nutrition Therapy Plan and Nutrition Goals:   Nutrition Assessments:  MEDIFICTS Score Key:  ?70 Need to make dietary changes   40-70 Heart Healthy Diet  ? 40 Therapeutic Level Cholesterol Diet  [facility]  Picture Your Plate Total Score on Admission 32     Picture Your Plate Scores:  <4<32nhealthy dietary pattern with much room for improvement.  41-50 Dietary pattern unlikely to meet recommendations for good health and room for improvement.  51-60 More healthful dietary pattern, with some room for  improvement.   >60 Healthy dietary pattern, although there may be some specific behaviors that could be improved.    Nutrition Goals Re-Evaluation:   Nutrition Goals Discharge (Final Nutrition Goals Re-Evaluation):   Psychosocial: Target Goals: Acknowledge presence or absence of significant depression and/or stress, maximize coping skills, provide positive support system. Participant is able to verbalize types and ability to use techniques and skills needed for reducing stress and depression.   Education: Stress, Anxiety, and Depression - Group verbal and visual presentation to define topics covered.  Reviews how body is impacted by stress, anxiety, and depression.  Also discusses healthy ways to reduce stress and to treat/manage anxiety and depression.  Written material given at graduation.   Education: Sleep Hygiene -Provides group verbal and written instruction about how sleep can affect your health.  Define sleep hygiene, discuss sleep cycles and impact of sleep habits. Review good sleep hygiene tips.    Initial Review & Psychosocial Screening:  Initial Psych Review & Screening - 03/21/20 1344      Initial Review   Current issues with Current Psychotropic Meds;Current Anxiety/Panic      Family Dynamics   Good Support System? Yes    Comments She sometimes gets anxiety with the heart issues she has gone through. She can look to her husband and her four puppies.      Barriers   Psychosocial barriers to participate in program The patient should benefit from training in stress management and relaxation.      Screening Interventions   Interventions Encouraged to exercise;To provide support and resources with identified psychosocial needs;Provide feedback about the scores to participant    Expected Outcomes Short Term goal: Utilizing psychosocial counselor, staff and physician to assist with identification of specific Stressors or current issues interfering with healing process.  Setting desired goal for each stressor or current issue identified.;Long Term Goal: Stressors or current issues are controlled or eliminated.;Short Term goal: Identification and review with participant of any Quality of Life or Depression concerns found by scoring the questionnaire.;Long Term goal: The participant improves quality of Life and PHQ9 Scores as seen by post scores and/or verbalization of changes           Quality of Life Scores:   Quality of Life - 03/22/20 1513      Quality of Life   Select Quality of Life      Quality of Life Scores   Health/Function Pre 23.18 %    Socioeconomic Pre 28.75 %    Psych/Spiritual Pre 24.5 %    Family Pre 27.6 %    GLOBAL Pre 24.97 %          Scores of 19 and below usually indicate a poorer quality of life in these areas.  A difference of  2-3 points is a clinically meaningful difference.  A difference of 2-3 points in the total score of the Quality of Life Index has been associated with significant improvement in overall quality of life, self-image, physical symptoms, and general health in studies assessing change in quality of life.  PHQ-9: Recent Review Flowsheet Data    Depression screen [facility] 2/9 03/22/2020 03/14/2020 01/08/2019 12/20/2016 12/20/2016   Decreased Interest 1 2 0 0 0   Down, Depressed, Hopeless 1 1 0 0 0   PHQ - 2 Score 2 3 0 0 0   Altered sleeping 1 2 0 - 0   Tired, decreased energy 1 2 0 - 1   Change in appetite 1 2 0 - 0   Feeling bad or failure about yourself  1 3 0 - 0   Trouble concentrating 1 2 0 - 0   Moving slowly or fidgety/restless 0 2 0 - 0   Suicidal thoughts 0 0 0 - 0   PHQ-9 Score 7 16 0 - 1   Difficult doing work/chores Not difficult at all - Not difficult at all - Not difficult at all     Interpretation of Total Score  Total Score Depression Severity:  1-4 = Minimal depression, 5-9 = Mild depression, 10-14 = Moderate depression, 15-19 = Moderately severe depression, 20-27 = Severe depression    Psychosocial Evaluation and Intervention:  Psychosocial Evaluation - 03/21/20 1346      Psychosocial Evaluation & Interventions   Interventions Encouraged to exercise with the program and follow exercise prescription;Stress management education;Relaxation education    Comments She sometimes gets anxiety with the heart issues she has gone through. She can look  to her husband and her four puppies.    Expected Outcomes Short: Exercise regularly to support mental health and notify staff of any changes. Long: maintain mental health and well being through teaching of rehab or prescribed medications independently.    Continue Psychosocial Services  Follow up required by staff           Psychosocial Re-Evaluation:   Psychosocial Discharge (Final Psychosocial Re-Evaluation):   Vocational Rehabilitation: Provide vocational rehab assistance to qualifying candidates.   Vocational Rehab Evaluation & Intervention:   Education: Education Goals: Education classes will be provided on a variety of topics geared toward better understanding of heart health and risk factor modification. Participant will state understanding/return demonstration of topics presented as noted by education test scores.  Learning Barriers/Preferences:  Learning Barriers/Preferences - 03/21/20 1342      Learning Barriers/Preferences   Learning Barriers None    Learning Preferences None           General Cardiac Education Topics:  AED/CPR: - Group verbal and written instruction with the use of models to demonstrate the basic use of the AED with the basic ABC's of resuscitation.   Anatomy and Cardiac Procedures: - Group verbal and visual presentation and models provide information about basic cardiac anatomy and function. Reviews the testing methods done to diagnose heart disease and the outcomes of the test results. Describes the treatment choices: Medical Management, Angioplasty, or Coronary Bypass Surgery for  treating various heart conditions including Myocardial Infarction, Angina, Valve Disease, and Cardiac Arrhythmias.  Written material given at graduation. Flowsheet Row Cardiac Rehab from 03/30/2020 in [facility] Cardiac and Pulmonary Rehab  Date 03/30/20  Educator [facility]  Instruction Review Code 1- Verbalizes Understanding      Medication Safety: - Group verbal and visual instruction to review commonly prescribed medications for heart and lung disease. Reviews the medication, class of the drug, and side effects. Includes the steps to properly store meds and maintain the prescription regimen.  Written material given at graduation.   Intimacy: - Group verbal instruction through game format to discuss how heart and lung disease can affect sexual intimacy. Written material given at graduation..   Know Your Numbers and Heart Failure: - Group verbal and visual instruction to discuss disease risk factors for cardiac and pulmonary disease and treatment options.  Reviews associated critical values for Overweight/Obesity, Hypertension, Cholesterol, and Diabetes.  Discusses basics of heart failure: signs/symptoms and treatments.  Introduces Heart Failure Zone chart for action plan for heart failure.  Written material given at graduation.   Infection Prevention: - Provides verbal and written material to individual with discussion of infection control including proper hand washing and proper equipment cleaning during exercise session. Flowsheet Row Cardiac Rehab from 03/30/2020 in [facility] Cardiac and Pulmonary Rehab  Date 03/22/20  Educator AS  Instruction Review Code 1- Verbalizes Understanding      Falls Prevention: - Provides verbal and written material to individual with discussion of falls prevention and safety. Flowsheet Row Cardiac Rehab from 03/30/2020 in [facility] Cardiac and Pulmonary Rehab  Date 03/22/20  Educator AS  Instruction Review Code 1- Verbalizes Understanding      Other: -Provides group  and verbal instruction on various topics (see comments)   Knowledge Questionnaire Score:  Knowledge Questionnaire Score - 03/22/20 1514      Knowledge Questionnaire Score   Pre Score 22/26 angina, nutrition           Core Components/Risk Factors/Patient Goals at Admission:  Personal Goals and Risk Factors  at Admission - 03/22/20 1517      Core Components/Risk Factors/Patient Goals on Admission    Weight Management Yes;Weight Loss    Intervention Weight Management: Develop a combined nutrition and exercise program designed to reach desired caloric intake, while maintaining appropriate intake of nutrient and fiber, sodium and fats, and appropriate energy expenditure required for the weight goal.;Weight Management: Provide education and appropriate resources to help participant work on and attain dietary goals.;Weight Management/Obesity: Establish reasonable short term and long term weight goals.;Obesity: Provide education and appropriate resources to help participant work on and attain dietary goals.    Expected Outcomes Short Term: Continue to assess and modify interventions until short term weight is achieved;Long Term: Adherence to nutrition and physical activity/exercise program aimed toward attainment of established weight goal;Weight Loss: Understanding of general recommendations for a balanced deficit meal plan, which promotes 1-2 lb weight loss per week and includes a negative energy balance of [phone] kcal/d;Understanding recommendations for meals to include 15-35% energy as protein, 25-35% energy from fat, 35-60% energy from carbohydrates, less than 283m of dietary cholesterol, 20-35 gm of total fiber daily;Understanding of distribution of calorie intake throughout the day with the consumption of 4-5 meals/snacks    Diabetes Yes    Intervention Provide education about signs/symptoms and action to take for hypo/hyperglycemia.;Provide education about proper nutrition, including  hydration, and aerobic/resistive exercise prescription along with prescribed medications to achieve blood glucose in normal ranges: Fasting glucose 65-99 mg/dL    Expected Outcomes Short Term: Participant verbalizes understanding of the signs/symptoms and immediate care of hyper/hypoglycemia, proper foot care and importance of medication, aerobic/resistive exercise and nutrition plan for blood glucose control.;Long Term: Attainment of HbA1C < 7%.    Hypertension Yes    Intervention Provide education on lifestyle modifcations including regular physical activity/exercise, weight management, moderate sodium restriction and increased consumption of fresh fruit, vegetables, and low fat dairy, alcohol moderation, and smoking cessation.;Monitor prescription use compliance.    Expected Outcomes Short Term: Continued assessment and intervention until BP is < 140/93mHG in hypertensive participants. < 130/8010mG in hypertensive participants with diabetes, heart failure or chronic kidney disease.;Long Term: Maintenance of blood pressure at goal levels.    Lipids Yes    Intervention Provide education and support for participant on nutrition & aerobic/resistive exercise along with prescribed medications to achieve LDL <29m87mDL >40mg35m Expected Outcomes Long Term: Cholesterol controlled with medications as prescribed, with individualized exercise RX and with personalized nutrition plan. Value goals: LDL < 29mg,56m > 40 mg.;Short Term: Participant states understanding of desired cholesterol values and is compliant with medications prescribed. Participant is following exercise prescription and nutrition guidelines.           Education:Diabetes - Individual verbal and written instruction to review signs/symptoms of diabetes, desired ranges of glucose level fasting, after meals and with exercise. Acknowledge that pre and post exercise glucose checks will be done for 3 sessions at entry of program. [facility]  Date 03/21/20  [facility]ator JH  [facility] Code 1- Verbalizes Understanding      Core Components/Risk Factors/Patient Goals Review:    Core Components/Risk Factors/Patient Goals at Discharge (Final Review):    ITP Comments:  ITP Comments    Row Name 03/21/20 1342 03/22/20 1526 03/28/20 1336 04/06/20 0520     ITP Comments Virtual Visit completed. Patient informed on EP and RD appointment and 6 Minute walk test. Patient also  informed of patient health questionnaires on My Chart. Patient Verbalizes understanding. Visit diagnosis can be found in [facility]12/10/2019. Completed 6MWT and gym orientation. Initial ITP created and sent for review to Dr. Emily Filbert, Medical Director. First full day of exercise!  Patient was oriented to gym and equipment including functions, settings, policies, and procedures.  Patient's individual exercise prescription and treatment plan were reviewed.  All starting workloads were established based on the results of the 6 minute walk test done at initial orientation visit.  The plan for exercise progression was also introduced and progression will be customized based on patient's performance and goals. 30 Day review completed. Medical Director ITP review done, changes made as directed, and signed approval by Medical Director.           Comments:

## 2020-04-07 ENCOUNTER — Encounter: Payer: Medicare Other | Admitting: *Deleted

## 2020-04-07 ENCOUNTER — Other Ambulatory Visit: Payer: Self-pay

## 2020-04-07 DIAGNOSIS — I214 Non-ST elevation (NSTEMI) myocardial infarction: Secondary | ICD-10-CM | POA: Diagnosis not present

## 2020-04-07 NOTE — Progress Notes (Signed)
Daily Session Note  Patient Details  Name: Amanda Wiley MRN: [id number] Date of Birth: 22-Jun-1955 Referring Provider:   Flowsheet Row Cardiac Rehab from 03/22/2020 in [facility] Cardiac and Pulmonary Rehab  Referring Provider Fath      Encounter Date: 04/07/2020  Check In:  Session Check In - 04/07/20 1336      Check-In   Supervising physician immediately available to respond to emergencies See telemetry face sheet for immediately available ER MD    Location [facility]-Cardiac & Pulmonary Rehab    Staff Present Renita Papa, RN Vickki Hearing, BA, ACSM CEP, Exercise Physiologist;Laureen Owens Shark, BS, RRT, CPFT    Virtual Visit No    Medication changes reported     No    Fall or balance concerns reported    No    Warm-up and Cool-down Performed on first and last piece of equipment    Resistance Training Performed Yes    VAD Patient? No    PAD/SET Patient? No      Pain Assessment   Currently in Pain? No/denies              Social History   Tobacco Use  Smoking Status Former Smoker  . Packs/day: 2.00  . Years: 15.00  . Pack years: 30.00  . Types: Cigarettes  . Quit date: 04/09/1997  . Years since quitting: 23.0  Smokeless Tobacco Never Used    Goals Met:  Independence with exercise equipment Exercise tolerated well No report of cardiac concerns or symptoms Strength training completed today  Goals Unmet:  Not Applicable  Comments: Pt able to follow exercise prescription today without complaint.  Will continue to monitor for progression.    Dr. Emily Filbert is Medical Director for [place] and [facility].

## 2020-04-12 ENCOUNTER — Other Ambulatory Visit: Payer: Self-pay

## 2020-04-12 ENCOUNTER — Other Ambulatory Visit (INDEPENDENT_AMBULATORY_CARE_PROVIDER_SITE_OTHER): Payer: Medicare Other

## 2020-04-12 DIAGNOSIS — E7800 Pure hypercholesterolemia, unspecified: Secondary | ICD-10-CM | POA: Diagnosis not present

## 2020-04-12 LAB — HEPATIC FUNCTION PANEL
ALT: 23 U/L (ref 0–35)
AST: 22 U/L (ref 0–37)
Albumin: 4.4 g/dL (ref 3.5–5.2)
Alkaline Phosphatase: 53 U/L (ref 39–117)
Bilirubin, Direct: 0.1 mg/dL (ref 0.0–0.3)
Total Bilirubin: 0.4 mg/dL (ref 0.2–1.2)
Total Protein: 7.2 g/dL (ref 6.0–8.3)

## 2020-04-13 ENCOUNTER — Encounter: Payer: Medicare Other | Attending: Cardiology

## 2020-04-13 DIAGNOSIS — Z87891 Personal history of nicotine dependence: Secondary | ICD-10-CM | POA: Diagnosis not present

## 2020-04-13 DIAGNOSIS — I214 Non-ST elevation (NSTEMI) myocardial infarction: Secondary | ICD-10-CM | POA: Diagnosis present

## 2020-04-13 NOTE — Progress Notes (Signed)
Daily Session Note  Patient Details  Name: Amanda Wiley MRN: [id number] Date of Birth: Aug 25, 1955 Referring Provider:   Flowsheet Row Cardiac Rehab from 03/22/2020 in [facility] Cardiac and Pulmonary Rehab  Referring Provider Fath      Encounter Date: 04/13/2020  Check In:  Session Check In - 04/13/20 1328      Check-In   Supervising physician immediately available to respond to emergencies See telemetry face sheet for immediately available ER MD    Location [facility]-Cardiac & Pulmonary Rehab    Staff Present Birdie Sons, MPA, RN;Joseph Lou Miner, [place] Exercise Physiologist    Virtual Visit No    Medication changes reported     No    Fall or balance concerns reported    No    Warm-up and Cool-down Performed on first and last piece of equipment    Resistance Training Performed Yes    VAD Patient? No    PAD/SET Patient? No      Pain Assessment   Currently in Pain? No/denies              Social History   Tobacco Use  Smoking Status Former Smoker  . Packs/day: 2.00  . Years: 15.00  . Pack years: 30.00  . Types: Cigarettes  . Quit date: 04/09/1997  . Years since quitting: 23.0  Smokeless Tobacco Never Used    Goals Met:  Independence with exercise equipment Exercise tolerated well No report of cardiac concerns or symptoms Strength training completed today  Goals Unmet:  Not Applicable  Comments: Pt able to follow exercise prescription today without complaint.  Will continue to monitor for progression.    Dr. Emily Filbert is Medical Director for [place] and [facility].

## 2020-04-18 ENCOUNTER — Other Ambulatory Visit: Payer: Self-pay

## 2020-04-18 DIAGNOSIS — I214 Non-ST elevation (NSTEMI) myocardial infarction: Secondary | ICD-10-CM | POA: Diagnosis not present

## 2020-04-18 NOTE — Progress Notes (Signed)
Daily Session Note  Patient Details  Name: Amanda Wiley MRN: [id number] Date of Birth: 15-Nov-1955 Referring Provider:   Flowsheet Row Cardiac Rehab from 03/22/2020 in [facility] Cardiac and Pulmonary Rehab  Referring Provider Fath      Encounter Date: 04/18/2020  Check In:  Session Check In - 04/18/20 1341      Check-In   Supervising physician immediately available to respond to emergencies See telemetry face sheet for immediately available ER MD    Location [facility]-Cardiac & Pulmonary Rehab    Staff Present Birdie Sons, MPA, Mauricia Area, BS, ACSM CEP, Exercise Physiologist;Kara Eliezer Bottom, MS Exercise Physiologist    Virtual Visit No    Medication changes reported     No    Fall or balance concerns reported    No    Warm-up and Cool-down Performed on first and last piece of equipment    Resistance Training Performed Yes    VAD Patient? No    PAD/SET Patient? No      Pain Assessment   Currently in Pain? No/denies              Social History   Tobacco Use  Smoking Status Former Smoker  . Packs/day: 2.00  . Years: 15.00  . Pack years: 30.00  . Types: Cigarettes  . Quit date: 04/09/1997  . Years since quitting: 23.0  Smokeless Tobacco Never Used    Goals Met:  Independence with exercise equipment Exercise tolerated well Personal goals reviewed No report of cardiac concerns or symptoms Strength training completed today  Goals Unmet:  Not Applicable  Comments: Pt able to follow exercise prescription today without complaint.  Will continue to monitor for progression.    Dr. Emily Filbert is Medical Director for [place] and [facility].

## 2020-04-20 ENCOUNTER — Other Ambulatory Visit: Payer: Self-pay

## 2020-04-20 DIAGNOSIS — I214 Non-ST elevation (NSTEMI) myocardial infarction: Secondary | ICD-10-CM

## 2020-04-20 NOTE — Progress Notes (Signed)
Daily Session Note  Patient Details  Name: Amanda Wiley MRN: [id number] Date of Birth: 1955-11-14 Referring Provider:   Flowsheet Row Cardiac Rehab from 03/22/2020 in [facility] Cardiac and Pulmonary Rehab  Referring Provider Fath      Encounter Date: 04/20/2020  Check In:  Session Check In - 04/20/20 1316      Check-In   Supervising physician immediately available to respond to emergencies See telemetry face sheet for immediately available ER MD    Location [facility]-Cardiac & Pulmonary Rehab    Staff Present Birdie Sons, MPA, Elveria Rising, BA, ACSM CEP, Exercise Physiologist;Kara Eliezer Bottom, MS Exercise Physiologist    Virtual Visit No    Medication changes reported     No    Fall or balance concerns reported    No    Warm-up and Cool-down Performed on first and last piece of equipment    Resistance Training Performed Yes    VAD Patient? No    PAD/SET Patient? No      Pain Assessment   Currently in Pain? No/denies              Social History   Tobacco Use  Smoking Status Former Smoker  . Packs/day: 2.00  . Years: 15.00  . Pack years: 30.00  . Types: Cigarettes  . Quit date: 04/09/1997  . Years since quitting: 23.0  Smokeless Tobacco Never Used    Goals Met:  Independence with exercise equipment Exercise tolerated well No report of cardiac concerns or symptoms Strength training completed today  Goals Unmet:  Not Applicable  Comments: Pt able to follow exercise prescription today without complaint.  Will continue to monitor for progression.    Dr. Emily Filbert is Medical Director for [place] and [facility].

## 2020-04-21 ENCOUNTER — Other Ambulatory Visit: Payer: Self-pay

## 2020-04-21 ENCOUNTER — Encounter: Payer: Medicare Other | Admitting: *Deleted

## 2020-04-21 DIAGNOSIS — I214 Non-ST elevation (NSTEMI) myocardial infarction: Secondary | ICD-10-CM

## 2020-04-21 NOTE — Progress Notes (Signed)
Daily Session Note  Patient Details  Name: Amanda Wiley MRN: [id number] Date of Birth: 1955-04-17 Referring Provider:   Flowsheet Row Cardiac Rehab from 03/22/2020 in [facility] Cardiac and Pulmonary Rehab  Referring Provider Fath      Encounter Date: 04/21/2020  Check In:  Session Check In - 04/21/20 1453      Check-In   Supervising physician immediately available to respond to emergencies See telemetry face sheet for immediately available ER MD    Location [facility]-Cardiac & Pulmonary Rehab    Staff Present Heath Lark, RN, BSN, CCRP;Joseph Hood RCP,RRT,BSRT;Jessica [place], [place], [place], [place], CCET    Virtual Visit No    Medication changes reported     No    Fall or balance concerns reported    No    Warm-up and Cool-down Performed on first and last piece of equipment    Resistance Training Performed Yes    VAD Patient? No    PAD/SET Patient? No      Pain Assessment   Currently in Pain? No/denies              Social History   Tobacco Use  Smoking Status Former Smoker  . Packs/day: 2.00  . Years: 15.00  . Pack years: 30.00  . Types: Cigarettes  . Quit date: 04/09/1997  . Years since quitting: 23.0  Smokeless Tobacco Never Used    Goals Met:  Exercise tolerated well Personal goals reviewed No report of cardiac concerns or symptoms  Goals Unmet:  Not Applicable  Comments: Pt able to follow exercise prescription today without complaint.  Will continue to monitor for progression.     Dr. Emily Filbert is Medical Director for [place] and [facility].

## 2020-04-27 ENCOUNTER — Other Ambulatory Visit: Payer: Self-pay

## 2020-04-27 DIAGNOSIS — I214 Non-ST elevation (NSTEMI) myocardial infarction: Secondary | ICD-10-CM

## 2020-04-27 NOTE — Progress Notes (Signed)
Daily Session Note  Patient Details  Name: MARLY SCHULD MRN: [id number] Date of Birth: July 08, 1955 Referring Provider:   Flowsheet Row Cardiac Rehab from 03/22/2020 in [facility] Cardiac and Pulmonary Rehab  Referring Provider Fath      Encounter Date: 04/27/2020  Check In:  Session Check In - 04/27/20 1332      Check-In   Supervising physician immediately available to respond to emergencies See telemetry face sheet for immediately available ER MD    Location [facility]-Cardiac & Pulmonary Rehab    Staff Present Birdie Sons, MPA, RN;Joseph Lou Miner, [place] Exercise Physiologist    Virtual Visit No    Medication changes reported     No    Fall or balance concerns reported    No    Warm-up and Cool-down Performed on first and last piece of equipment    Resistance Training Performed Yes    VAD Patient? No    PAD/SET Patient? No      Pain Assessment   Currently in Pain? No/denies              Social History   Tobacco Use  Smoking Status Former Smoker  . Packs/day: 2.00  . Years: 15.00  . Pack years: 30.00  . Types: Cigarettes  . Quit date: 04/09/1997  . Years since quitting: 23.0  Smokeless Tobacco Never Used    Goals Met:  Independence with exercise equipment Exercise tolerated well No report of cardiac concerns or symptoms Strength training completed today  Goals Unmet:  Not Applicable  Comments: Pt able to follow exercise prescription today without complaint.  Will continue to monitor for progression.    Dr. Emily Filbert is Medical Director for [place] and [facility].

## 2020-04-28 ENCOUNTER — Other Ambulatory Visit: Payer: Self-pay

## 2020-04-28 ENCOUNTER — Encounter: Payer: Medicare Other | Admitting: *Deleted

## 2020-04-28 DIAGNOSIS — I214 Non-ST elevation (NSTEMI) myocardial infarction: Secondary | ICD-10-CM

## 2020-04-28 NOTE — Progress Notes (Signed)
Daily Session Note  Patient Details  Name: Amanda Wiley MRN: [id number] Date of Birth: 07-31-1955 Referring Provider:   Flowsheet Row Cardiac Rehab from 03/22/2020 in [facility] Cardiac and Pulmonary Rehab  Referring Provider Fath      Encounter Date: 04/28/2020  Check In:  Session Check In - 04/28/20 1327      Check-In   Supervising physician immediately available to respond to emergencies See telemetry face sheet for immediately available ER MD    Location [facility]-Cardiac & Pulmonary Rehab    Staff Present Renita Papa, RN BSN;Joseph [address] [place], [place], [place], CCRP, CCET    Virtual Visit No    Medication changes reported     No    Fall or balance concerns reported    No    Warm-up and Cool-down Performed on first and last piece of equipment    Resistance Training Performed Yes    VAD Patient? No    PAD/SET Patient? No      Pain Assessment   Currently in Pain? No/denies              Social History   Tobacco Use  Smoking Status Former Smoker  . Packs/day: 2.00  . Years: 15.00  . Pack years: 30.00  . Types: Cigarettes  . Quit date: 04/09/1997  . Years since quitting: 23.0  Smokeless Tobacco Never Used    Goals Met:  Independence with exercise equipment Exercise tolerated well No report of cardiac concerns or symptoms Strength training completed today  Goals Unmet:  Not Applicable  Comments: Pt able to follow exercise prescription today without complaint.  Will continue to monitor for progression.    Dr. Emily Filbert is Medical Director for [place] and [facility].

## 2020-05-02 ENCOUNTER — Other Ambulatory Visit: Payer: Self-pay

## 2020-05-02 DIAGNOSIS — I214 Non-ST elevation (NSTEMI) myocardial infarction: Secondary | ICD-10-CM

## 2020-05-02 NOTE — Progress Notes (Signed)
Daily Session Note  Patient Details  Name: Amanda Wiley MRN: [id number] Date of Birth: [birth year] Referring Provider:   Flowsheet Row Cardiac Rehab from 03/22/2020 in [facility] Cardiac and Pulmonary Rehab  Referring Provider Fath      Encounter Date: 05/02/2020  Check In:  Session Check In - 05/02/20 1348      Check-In   Supervising physician immediately available to respond to emergencies See telemetry face sheet for immediately available ER MD    Location [facility]-Cardiac & Pulmonary Rehab    Staff Present Earlean Shawl, BS, ACSM CEP, Exercise Physiologist;Janos Shampine Rosalia Hammers, MPA, Nino Glow, MS Exercise Physiologist    Virtual Visit No    Medication changes reported     No    Fall or balance concerns reported    No    Warm-up and Cool-down Performed on first and last piece of equipment    Resistance Training Performed Yes    VAD Patient? No    PAD/SET Patient? No      Pain Assessment   Currently in Pain? No/denies              Social History   Tobacco Use  Smoking Status Former Smoker  . Packs/day: 2.00  . Years: 15.00  . Pack years: 30.00  . Types: Cigarettes  . Quit date: 04/09/1997  . Years since quitting: 23.0  Smokeless Tobacco Never Used    Goals Met:  Independence with exercise equipment Exercise tolerated well No report of cardiac concerns or symptoms Strength training completed today  Goals Unmet:  Not Applicable  Comments: Pt able to follow exercise prescription today without complaint.  Will continue to monitor for progression.    Dr. Emily Filbert is Medical Director for [place] and [facility].

## 2020-05-04 ENCOUNTER — Encounter: Payer: Self-pay | Admitting: *Deleted

## 2020-05-04 ENCOUNTER — Other Ambulatory Visit: Payer: Self-pay

## 2020-05-04 DIAGNOSIS — I214 Non-ST elevation (NSTEMI) myocardial infarction: Secondary | ICD-10-CM

## 2020-05-04 NOTE — Progress Notes (Signed)
Daily Session Note  Patient Details  Name: Amanda Wiley MRN: [id number] Date of Birth: 1955-05-15 Referring Provider:   Flowsheet Row Cardiac Rehab from 03/22/2020 in [facility] Cardiac and Pulmonary Rehab  Referring Provider Fath      Encounter Date: 05/04/2020  Check In:  Session Check In - 05/04/20 1329      Check-In   Supervising physician immediately available to respond to emergencies See telemetry face sheet for immediately available ER MD    Location [facility]-Cardiac & Pulmonary Rehab    Staff Present Birdie Sons, MPA, Elveria Rising, BA, ACSM CEP, Exercise Physiologist;Kara Eliezer Bottom, MS Exercise Physiologist    Virtual Visit No    Medication changes reported     No    Fall or balance concerns reported    No    Warm-up and Cool-down Performed on first and last piece of equipment    Resistance Training Performed Yes    VAD Patient? No    PAD/SET Patient? No      Pain Assessment   Currently in Pain? No/denies              Social History   Tobacco Use  Smoking Status Former Smoker  . Packs/day: 2.00  . Years: 15.00  . Pack years: 30.00  . Types: Cigarettes  . Quit date: 04/09/1997  . Years since quitting: 23.0  Smokeless Tobacco Never Used    Goals Met:  Independence with exercise equipment Exercise tolerated well No report of cardiac concerns or symptoms Strength training completed today  Goals Unmet:  Not Applicable  Comments: Pt able to follow exercise prescription today without complaint.  Will continue to monitor for progression.    Dr. Emily Filbert is Medical Director for [place] and [facility].

## 2020-05-04 NOTE — Progress Notes (Signed)
Cardiac Individual Treatment Plan  Patient Details  Name: Amanda Wiley MRN: [id number] Date of Birth: 06/15/1955 Referring Provider:   Flowsheet Row Cardiac Rehab from 03/22/2020 in [facility] Cardiac and Pulmonary Rehab  Referring Provider Fath      Initial Encounter Date:  Flowsheet Row Cardiac Rehab from 03/22/2020 in [facility] Cardiac and Pulmonary Rehab  Date 03/22/20      Visit Diagnosis: NSTEMI (non-ST elevated myocardial infarction) [facility])  Patient's Home Medications on Admission:  Current Outpatient Medications:  .  acetaminophen (TYLENOL) 325 MG tablet, Take 2 tablets (650 mg total) by mouth every 6 (six) hours as needed for mild pain (or Fever >/= 101)., Disp:  , Rfl:  .  albuterol (PROVENTIL) (2.5 MG/3ML) 0.083% nebulizer solution, Take 2.5 mg by nebulization every 6 (six) hours as needed for wheezing., Disp: , Rfl:  .  amLODipine (NORVASC) 5 MG tablet, Take 1 tablet (5 mg total) by mouth daily., Disp: 30 tablet, Rfl: 0 .  amLODipine (NORVASC) 5 MG tablet, Take 1 tablet by mouth daily., Disp: , Rfl:  .  aspirin 81 MG chewable tablet, Chew 1 tablet (81 mg total) by mouth daily., Disp: 30 tablet, Rfl: 0 .  aspirin 81 MG chewable tablet, Chew 1 tablet by mouth daily., Disp: , Rfl:  .  buPROPion (WELLBUTRIN XL) 300 MG 24 hr tablet, Take 1 tablet (300 mg total) by mouth daily., Disp: 90 tablet, Rfl: 1 .  Dulaglutide (TRULICITY) 1.5 WL/7.9GX SOPN, Inject 1.5 mg into the skin once a week. ON SUNDAYS, Disp: , Rfl:  .  fluticasone-salmeterol (ADVAIR HFA) 230-21 MCG/ACT inhaler, Inhale 2 puffs into the lungs 2 (two) times daily., Disp: , Rfl:  .  hyoscyamine (LEVSIN SL) 0.125 MG SL tablet, Place 0.125 mg under the tongue as needed., Disp: , Rfl:  .  insulin glargine (LANTUS) 100 UNIT/ML injection, Inject 20 Units into the skin at bedtime. , Disp: , Rfl:  .  ipratropium (ATROVENT) 0.06 % nasal spray, Place 2 sprays into both nostrils 3 (three) times daily as needed for rhinitis.  (Patient not  taking: Reported on 03/21/2020), Disp: , Rfl:  .  Ipratropium-Albuterol (COMBIVENT) 20-100 MCG/ACT AERS respimat, Inhale 2 puffs into the lungs every 4 (four) hours while awake. (Patient taking differently: Inhale 1 puff into the lungs 2 (two) times daily. And as needed as a rescue inhaler.), Disp: 4 g, Rfl: 2 .  isosorbide mononitrate (IMDUR) 30 MG 24 hr tablet, Take 1 tablet (30 mg total) by mouth daily., Disp: 30 tablet, Rfl: 0 .  JARDIANCE 25 MG TABS tablet, Take 25 mg by mouth daily., Disp: , Rfl:  .  losartan (COZAAR) 25 MG tablet, Take 1 tablet (25 mg total) by mouth daily., Disp: 30 tablet, Rfl: 0 .  metoprolol tartrate (LOPRESSOR) 25 MG tablet, Take 0.5 tablets (12.5 mg total) by mouth 2 (two) times daily., Disp: 30 tablet, Rfl: 0 .  nitroGLYCERIN (NITROSTAT) 0.4 MG SL tablet, Place 1 tablet (0.4 mg total) under the tongue every 5 (five) minutes as needed for chest pain., Disp: 30 tablet, Rfl: 0 .  prasugrel (EFFIENT) 10 MG TABS tablet, Take 1 tablet (10 mg total) by mouth daily., Disp: 30 tablet, Rfl: 0 .  RABEprazole (ACIPHEX) 20 MG tablet, Take 1 tablet (20 mg total) by mouth in the morning and at bedtime., Disp: 180 tablet, Rfl: 1 .  rosuvastatin (CRESTOR) 5 MG tablet, Take 1 tablet (5 mg total) by mouth daily., Disp: 30 tablet, Rfl: 0 .  ULTRACARE  PEN NEEDLES 32G X 4 MM MISC, , Disp: , Rfl:  .  vitamin C (VITAMIN C) 1000 MG tablet, Take 1 tablet (1,000 mg total) by mouth daily., Disp:  , Rfl:   Past Medical History: Past Medical History:  Diagnosis Date  . Arthritis   . Asthma   . Complication of anesthesia    HARD TO WAKE UP  . COPD (chronic obstructive pulmonary disease) ([place])   . Crohn's disease ([place])   . Depression   . Diabetes mellitus ([place])   . Fracture, foot    post fall  . Hypercholesterolemia   . Hypertension   . IBS (irritable bowel syndrome)   . Nephrolithiasis    s/p lithotripsy [facility])  . Vitamin D deficiency     Tobacco Use: Social  History   Tobacco Use  Smoking Status Former Smoker  . Packs/day: 2.00  . Years: 15.00  . Pack years: 30.00  . Types: Cigarettes  . Quit date: 04/09/1997  . Years since quitting: 23.0  Smokeless Tobacco Never Used    Labs: Recent Chemical engineer    Labs for ITP Cardiac and Pulmonary Rehab Latest Ref Rng & Units 01/13/2019 02/02/2019 06/02/2019 10/14/2019 02/12/2020   Cholestrol 0 - 200 mg/dL - - 208(H) 210(H) 222(H)   LDLCALC 0 - 99 mg/dL - - - - -   LDLDIRECT mg/dL - - 120.0 128.0 149.0   HDL >39.00 mg/dL - - 39.20 41.20 40.50   Trlycerides 0.0 - 149.0 mg/dL - - 396.0(H) 324.0(H) 330.0(H)   Hemoglobin A1c 4.6 - 6.5 % 7.3(H) - 7.0(H) 7.6(H) 7.7(H)   PHART 7.350 - 7.450 - 7.50(H) - - -   PCO2ART 32.0 - 48.0 mmHg - 39 - - -   HCO3 20.0 - 28.0 mmol/L - 30.4(H) - - -   O2SAT % - 92.8 - - -       Exercise Target Goals: Exercise Program Goal: Individual exercise prescription set using results from initial 6 min walk test and THRR while considering  patient's activity barriers and safety.   Exercise Prescription Goal: Initial exercise prescription builds to 30-45 minutes a day of aerobic activity, 2-3 days per week.  Home exercise guidelines will be given to patient during program as part of exercise prescription that the participant will acknowledge.   Education: Aerobic Exercise: - Group verbal and visual presentation on the components of exercise prescription. Introduces F.I.T.T principle from ACSM for exercise prescriptions.  Reviews F.I.T.T. principles of aerobic exercise including progression. Written material given at graduation.   Education: Resistance Exercise: - Group verbal and visual presentation on the components of exercise prescription. Introduces F.I.T.T principle from ACSM for exercise prescriptions  Reviews F.I.T.T. principles of resistance exercise including progression. Written material given at graduation. Flowsheet Row Cardiac Rehab from 03/30/2020 in [facility]  Cardiac and Pulmonary Rehab  Date 03/30/20  Educator AS  Instruction Review Code 1- Verbalizes Understanding       Education: Exercise & Equipment Safety: - Individual verbal instruction and demonstration of equipment use and safety with use of the equipment. Flowsheet Row Cardiac Rehab from 03/30/2020 in [facility] Cardiac and Pulmonary Rehab  Date 03/22/20  Educator AS  Instruction Review Code 1- Verbalizes Understanding      Education: Exercise Physiology & General Exercise Guidelines: - Group verbal and written instruction with models to review the exercise physiology of the cardiovascular system and associated critical values. Provides general exercise guidelines with specific guidelines to those with heart or lung disease.  Education: Flexibility, Balance, Mind/Body Relaxation: - Group verbal and visual presentation with interactive activity on the components of exercise prescription. Introduces F.I.T.T principle from ACSM for exercise prescriptions. Reviews F.I.T.T. principles of flexibility and balance exercise training including progression. Also discusses the mind body connection.  Reviews various relaxation techniques to help reduce and manage stress (i.e. Deep breathing, progressive muscle relaxation, and visualization). Balance handout provided to take home. Written material given at graduation.   Activity Barriers & Risk Stratification:   6 Minute Walk:  6 Minute Walk    Row Name 03/22/20 1503         6 Minute Walk   Phase Initial     Distance 1170 feet     Walk Time 6 minutes     # of Rest Breaks 0     MPH 2.2     METS 3.2     RPE 8     Perceived Dyspnea  1     VO2 Peak 11.27     Symptoms Yes (comment)     Comments slightly SOB     Resting HR 64 bpm     Resting BP 138/68     Resting Oxygen Saturation  95 %     Exercise Oxygen Saturation  during 6 min walk 91 %     Max Ex. HR 113 bpm     Max Ex. BP 208/62     2 Minute Post BP 124/68            Oxygen  Initial Assessment:   Oxygen Re-Evaluation:   Oxygen Discharge (Final Oxygen Re-Evaluation):   Initial Exercise Prescription:  Initial Exercise Prescription - 03/22/20 1500      Date of Initial Exercise RX and Referring Provider   Date 03/22/20    Referring Provider Fath      Treadmill   MPH 2    Grade 1    Minutes 15    METs 2.8      Recumbant Bike   Level 1    RPM 60    Watts 38    Minutes 15    METs 3.2      Arm Ergometer   Level 1    RPM 25    Minutes 15    METs 3      REL-XR   Level 2    Speed 50    Minutes 15    METs 3.2      Prescription Details   Frequency (times per week) 3    Duration Progress to 30 minutes of continuous aerobic without signs/symptoms of physical distress      Intensity   THRR 40-80% of Max Heartrate 100-136    Ratings of Perceived Exertion 11-13    Perceived Dyspnea 0-4      Resistance Training   Training Prescription Yes    Weight 3 lb    Reps 10-15           Perform Capillary Blood Glucose checks as needed.  Exercise Prescription Changes:  Exercise Prescription Changes    Row Name 03/22/20 1500 04/04/20 1600 04/18/20 1000 04/18/20 1500       Response to Exercise   Blood Pressure (Admit) 138/68 122/68 140/80 --    Blood Pressure (Exercise) 208/62 174/78 180/76 --    Blood Pressure (Exit) 124/68 120/66 134/60 --    Heart Rate (Admit) 64 bpm 103 bpm 95 bpm --    Heart Rate (Exercise) 113 bpm 117 bpm 119 bpm --  Heart Rate (Exit) 84 bpm 81 bpm 86 bpm --    Oxygen Saturation (Admit) 95 % -- -- --    Oxygen Saturation (Exercise) 91 % -- -- --    Oxygen Saturation (Exit) 96 % -- -- --    Rating of Perceived Exertion (Exercise) 8 13 11  --    Perceived Dyspnea (Exercise) 1 -- -- --    Symptoms SOB none none --    Comments -- fourth session -- --    Duration -- Continue with 30 min of aerobic exercise without signs/symptoms of physical distress. Continue with 30 min of aerobic exercise without signs/symptoms of  physical distress. --    Intensity -- THRR unchanged THRR unchanged --         Progression   Progression -- Continue to progress workloads to maintain intensity without signs/symptoms of physical distress. Continue to progress workloads to maintain intensity without signs/symptoms of physical distress. --    Average METs -- 2.45 2.11 --         Resistance Training   Training Prescription -- Yes Yes --    Weight -- 3 lb 3 lb --    Reps -- 10-15 10-15 --         Treadmill   MPH -- 1.9 2 --    Grade -- 1 1 --    Minutes -- 15 15 --    METs -- 2.63 2.72 --         REL-XR   Level -- 2 1 --    Speed -- 50 50 --    Minutes -- 15 15 --    METs -- 2.1 1.5 --         Home Exercise Plan   Plans to continue exercise at -- -- -- Home (comment)  YouTube videos    Frequency -- -- -- Add 2 additional days to program exercise sessions.  Start with 1 extra day    Initial Home Exercises Provided -- -- -- 04/18/20           Exercise Comments:  Exercise Comments    Row Name 03/28/20 1336           Exercise Comments First full day of exercise!  Patient was oriented to gym and equipment including functions, settings, policies, and procedures.  Patient's individual exercise prescription and treatment plan were reviewed.  All starting workloads were established based on the results of the 6 minute walk test done at initial orientation visit.  The plan for exercise progression was also introduced and progression will be customized based on patient's performance and goals.              Exercise Goals and Review:  Exercise Goals    Row Name 03/22/20 1510             Exercise Goals   Increase Physical Activity Yes       Intervention Provide advice, education, support and counseling about physical activity/exercise needs.;Develop an individualized exercise prescription for aerobic and resistive training based on initial evaluation findings, risk stratification, comorbidities and  participant's personal goals.       Expected Outcomes Short Term: Attend rehab on a regular basis to increase amount of physical activity.;Long Term: Add in home exercise to make exercise part of routine and to increase amount of physical activity.;Long Term: Exercising regularly at least 3-5 days a week.       Increase Strength and Stamina Yes  Intervention Provide advice, education, support and counseling about physical activity/exercise needs.;Develop an individualized exercise prescription for aerobic and resistive training based on initial evaluation findings, risk stratification, comorbidities and participant's personal goals.       Expected Outcomes Short Term: Increase workloads from initial exercise prescription for resistance, speed, and METs.;Short Term: Perform resistance training exercises routinely during rehab and add in resistance training at home;Long Term: Improve cardiorespiratory fitness, muscular endurance and strength as measured by increased METs and functional capacity (6MWT)       Able to understand and use rate of perceived exertion (RPE) scale Yes       Intervention Provide education and explanation on how to use RPE scale       Expected Outcomes Short Term: Able to use RPE daily in rehab to express subjective intensity level;Long Term:  Able to use RPE to guide intensity level when exercising independently       Able to understand and use Dyspnea scale Yes       Intervention Provide education and explanation on how to use Dyspnea scale       Expected Outcomes Short Term: Able to use Dyspnea scale daily in rehab to express subjective sense of shortness of breath during exertion;Long Term: Able to use Dyspnea scale to guide intensity level when exercising independently       Knowledge and understanding of Target Heart Rate Range (THRR) Yes       Intervention Provide education and explanation of THRR including how the numbers were predicted and where they are located for  reference       Expected Outcomes Short Term: Able to state/look up THRR;Short Term: Able to use daily as guideline for intensity in rehab;Long Term: Able to use THRR to govern intensity when exercising independently       Able to check pulse independently Yes       Intervention Provide education and demonstration on how to check pulse in carotid and radial arteries.;Review the importance of being able to check your own pulse for safety during independent exercise       Expected Outcomes Short Term: Able to explain why pulse checking is important during independent exercise;Long Term: Able to check pulse independently and accurately       Understanding of Exercise Prescription Yes       Intervention Provide education, explanation, and written materials on patient's individual exercise prescription       Expected Outcomes Short Term: Able to explain program exercise prescription;Long Term: Able to explain home exercise prescription to exercise independently              Exercise Goals Re-Evaluation :  Exercise Goals Re-Evaluation    Row Name 03/28/20 1336 04/04/20 1652 04/18/20 1014 04/18/20 1342 04/18/20 1424     Exercise Goal Re-Evaluation   Exercise Goals Review Increase Physical Activity;Able to understand and use rate of perceived exertion (RPE) scale;Knowledge and understanding of Target Heart Rate Range (THRR);Understanding of Exercise Prescription;Increase Strength and Stamina;Able to understand and use Dyspnea scale;Able to check pulse independently Increase Physical Activity;Increase Strength and Stamina Increase Physical Activity;Increase Strength and Stamina Increase Physical Activity;Increase Strength and Stamina Increase Physical Activity;Increase Strength and Stamina;Understanding of Exercise Prescription   Comments Reviewed RPE and dyspnea scales, THR and program prescription with pt today.  Pt voiced understanding and was given a copy of goals to take home. Amanda Wiley has tolerated  exercise well in her first sessions.  She has gradually increase TM from 1.82mh/0% to 1.9  mph and 1 %.  She works in [employer] range.  We will monitor progress. Amanda Wiley continues to tolerate exercise well.  Staff will monitor progress. -- Reviewed home exercise with pt today.  Pt plans to complete home Youtube videos for exercise.  Reviewed THR, pulse, RPE, sign and symptoms, pulse oximetery and when to call 911 or MD.  Also discussed weather considerations and indoor options.  Pt voiced understanding. Advised patient to gradually increase her home exercise to +1 day/week, and slowly work her duration up.   Expected Outcomes Short: Use RPE daily to regulate intensity. Long: Follow program prescription in THR. Short: attend consistently Long: improve overall stamina Short: attned consistently Long: increase stamina -- Short: Add in 1 day of exercise at home Long: Ability to exercise at home independently without any complications          Discharge Exercise Prescription (Final Exercise Prescription Changes):  Exercise Prescription Changes - 04/18/20 1500      Home Exercise Plan   Plans to continue exercise at Home (comment)   YouTube videos   Frequency Add 2 additional days to program exercise sessions.   Start with 1 extra day   Initial Home Exercises Provided 04/18/20           Nutrition:  Target Goals: Understanding of nutrition guidelines, daily intake of sodium <1563m, cholesterol <2046m calories 30% from fat and 7% or less from saturated fats, daily to have 5 or more servings of fruits and vegetables.  Education: All About Nutrition: -Group instruction provided by verbal, written material, interactive activities, discussions, models, and posters to present general guidelines for heart healthy nutrition including fat, fiber, MyPlate, the role of sodium in heart healthy nutrition, utilization of the nutrition label, and utilization of this knowledge for meal planning. Follow up email sent as  well. Written material given at graduation.   Biometrics:  Pre Biometrics - 03/22/20 1511      Pre Biometrics   Height 5' 5.5" (1.664 m)    Weight 219 lb 4.8 oz (99.5 kg)    BMI (Calculated) 35.93    Single Leg Stand 5 seconds            Nutrition Therapy Plan and Nutrition Goals:  Nutrition Therapy & Goals - 04/18/20 1435      Nutrition Therapy   Diet Heart healthy, low Na    Protein (specify units) 80g    Fiber 25 grams    Whole Grain Foods 3 servings    Saturated Fats 12 max. grams    Fruits and Vegetables 8 servings/day    Sodium 1.5 grams      Personal Nutrition Goals   Nutrition Goal ST: when making pot roast, make extra tray of non starcy vegetbales husband may not like, cook 1 extra time during the week like omelette with vegetables, whole wheat toast, fruit LT: lower saturated fat, limit red meat and processed week, fruit and vegetable intake 8x/day    Comments She cut back on using salt after cooking (still cooks with salt), cut back on fried foods). She will cook to cater to her husbands needs (he has an ostomy); she loves brussells sprouts, greens, corn, cabbage, homemade vegetable soup. She will make vegetable soup and freeze it. Sleep in until 930, 10am - coffee (sweet n' low,non-dairy creamer). Drinks: diet caffiene free coke. L: bologna sandwich, leftovers (chinese, broccoli cheddar soup for example D: go out most often: taco salads, quarter pounder (someitmes), cheeseburger. Soups (homemade potato soup 2x/week), tomato  soup and grilled cheese. Sundays she will make a roast, vegetables, cornbread with gravy, mini raviolis, salads (chef) - she normally cooks those days. S: popcorn (white cheddar) - sometimesonly simply salted, nabs, little debbie cakes; she enjoys salty over sweet. She eats when shes not hungry, something looks good or she is craving something. She sometimes feels overwhelmed with the chnages she has to make, discussed small manageable changes. Discussed  heart healhty eating.      Intervention Plan   Intervention Prescribe, educate and counsel regarding individualized specific dietary modifications aiming towards targeted core components such as weight, hypertension, lipid management, diabetes, heart failure and other comorbidities.;Nutrition handout(s) given to patient.    Expected Outcomes Short Term Goal: Understand basic principles of dietary content, such as calories, fat, sodium, cholesterol and nutrients.;Short Term Goal: A plan has been developed with personal nutrition goals set during dietitian appointment.;Long Term Goal: Adherence to prescribed nutrition plan.           Nutrition Assessments:  MEDIFICTS Score Key:  ?70 Need to make dietary changes   40-70 Heart Healthy Diet  ? 40 Therapeutic Level Cholesterol Diet  Flowsheet Row Cardiac Rehab from 03/22/2020 in [facility] Cardiac and Pulmonary Rehab  Picture Your Plate Total Score on Admission 32     Picture Your Plate Scores:  <81 Unhealthy dietary pattern with much room for improvement.  41-50 Dietary pattern unlikely to meet recommendations for good health and room for improvement.  51-60 More healthful dietary pattern, with some room for improvement.   >60 Healthy dietary pattern, although there may be some specific behaviors that could be improved.    Nutrition Goals Re-Evaluation:   Nutrition Goals Discharge (Final Nutrition Goals Re-Evaluation):   Psychosocial: Target Goals: Acknowledge presence or absence of significant depression and/or stress, maximize coping skills, provide positive support system. Participant is able to verbalize types and ability to use techniques and skills needed for reducing stress and depression.   Education: Stress, Anxiety, and Depression - Group verbal and visual presentation to define topics covered.  Reviews how body is impacted by stress, anxiety, and depression.  Also discusses healthy ways to reduce stress and to  treat/manage anxiety and depression.  Written material given at graduation.   Education: Sleep Hygiene -Provides group verbal and written instruction about how sleep can affect your health.  Define sleep hygiene, discuss sleep cycles and impact of sleep habits. Review good sleep hygiene tips.    Initial Review & Psychosocial Screening:  Initial Psych Review & Screening - 03/21/20 1344      Initial Review   Current issues with Current Psychotropic Meds;Current Anxiety/Panic      Family Dynamics   Good Support System? Yes    Comments She sometimes gets anxiety with the heart issues she has gone through. She can look to her husband and her four puppies.      Barriers   Psychosocial barriers to participate in program The patient should benefit from training in stress management and relaxation.      Screening Interventions   Interventions Encouraged to exercise;To provide support and resources with identified psychosocial needs;Provide feedback about the scores to participant    Expected Outcomes Short Term goal: Utilizing psychosocial counselor, staff and physician to assist with identification of specific Stressors or current issues interfering with healing process. Setting desired goal for each stressor or current issue identified.;Long Term Goal: Stressors or current issues are controlled or eliminated.;Short Term goal: Identification and review with participant of any Quality of  Life or Depression concerns found by scoring the questionnaire.;Long Term goal: The participant improves quality of Life and PHQ9 Scores as seen by post scores and/or verbalization of changes           Quality of Life Scores:   Quality of Life - 03/22/20 1513      Quality of Life   Select Quality of Life      Quality of Life Scores   Health/Function Pre 23.18 %    Socioeconomic Pre 28.75 %    Psych/Spiritual Pre 24.5 %    Family Pre 27.6 %    GLOBAL Pre 24.97 %          Scores of 19 and below  usually indicate a poorer quality of life in these areas.  A difference of  2-3 points is a clinically meaningful difference.  A difference of 2-3 points in the total score of the Quality of Life Index has been associated with significant improvement in overall quality of life, self-image, physical symptoms, and general health in studies assessing change in quality of life.  PHQ-9: Recent Review Flowsheet Data    Depression screen [facility] 2/9 04/18/2020 03/22/2020 03/14/2020 01/08/2019 12/20/2016   Decreased Interest 1 1 2  0 0   Down, Depressed, Hopeless 1 1 1  0 0   PHQ - 2 Score 2 2 3  0 0   Altered sleeping 1 1 2  0 -   Tired, decreased energy 1 1 2  0 -   Change in appetite 2 1 2  0 -   Feeling bad or failure about yourself  1 1 3  0 -   Trouble concentrating 1 1 2  0 -   Moving slowly or fidgety/restless 0 0 2 0 -   Suicidal thoughts 0 0 0 0 -   PHQ-9 Score 8 7 16  0 -   Difficult doing work/chores Not difficult at all Not difficult at all - Not difficult at all -     Interpretation of Total Score  Total Score Depression Severity:  1-4 = Minimal depression, 5-9 = Mild depression, 10-14 = Moderate depression, 15-19 = Moderately severe depression, 20-27 = Severe depression   Psychosocial Evaluation and Intervention:  Psychosocial Evaluation - 03/21/20 1346      Psychosocial Evaluation & Interventions   Interventions Encouraged to exercise with the program and follow exercise prescription;Stress management education;Relaxation education    Comments She sometimes gets anxiety with the heart issues she has gone through. She can look to her husband and her four puppies.    Expected Outcomes Short: Exercise regularly to support mental health and notify staff of any changes. Long: maintain mental health and well being through teaching of rehab or prescribed medications independently.    Continue Psychosocial Services  Follow up required by staff           Psychosocial Re-Evaluation:  Psychosocial  Re-Evaluation    [place] Name 04/18/20 1408             Psychosocial Re-Evaluation   Current issues with Current Sleep Concerns;History of Depression       Comments Amanda Wiley is doing well Wiley. She reports she has been having bad dreams at night which interupts her sleep.Though, she can go back to sleep pretty soon after she wakes up. Talked about doing some type of meditation or deep breathing before bed and she already prays before she goes to sleep which calms her mind down. She does have good suport from family. She is currently taking medication  for depression and anxiety and she feels comfortable with where she is at. Denied wanting to see a therapist at this time. Her PHQ did go up 1 point, staff will continue to monitor.       Expected Outcomes Short: Talk to doctor aobut night terrors Long: Utilize exercise to help manage stress and maintain positive attitude       Interventions Encouraged to attend Cardiac Rehabilitation for the exercise       Continue Psychosocial Services  Follow up required by staff              Psychosocial Discharge (Final Psychosocial Re-Evaluation):  Psychosocial Re-Evaluation - 04/18/20 1408      Psychosocial Re-Evaluation   Current issues with Current Sleep Concerns;History of Depression    Comments Amanda Wiley. She reports she has been having bad dreams at night which interupts her sleep.Though, she can go back to sleep pretty soon after she wakes up. Talked about doing some type of meditation or deep breathing before bed and she already prays before she goes to sleep which calms her mind down. She does have good suport from family. She is currently taking medication for depression and anxiety and she feels comfortable with where she is at. Denied wanting to see a therapist at this time. Her PHQ did go up 1 point, staff will continue to monitor.    Expected Outcomes Short: Talk to doctor aobut night terrors Long: Utilize exercise to help  manage stress and maintain positive attitude    Interventions Encouraged to attend Cardiac Rehabilitation for the exercise    Continue Psychosocial Services  Follow up required by staff           Vocational Rehabilitation: Provide vocational rehab assistance to qualifying candidates.   Vocational Rehab Evaluation & Intervention:   Education: Education Goals: Education classes will be provided on a variety of topics geared toward better understanding of heart health and risk factor modification. Participant will state understanding/return demonstration of topics presented as noted by education test scores.  Learning Barriers/Preferences:  Learning Barriers/Preferences - 03/21/20 1342      Learning Barriers/Preferences   Learning Barriers None    Learning Preferences None           General Cardiac Education Topics:  AED/CPR: - Group verbal and written instruction with the use of models to demonstrate the basic use of the AED with the basic ABC's of resuscitation.   Anatomy and Cardiac Procedures: - Group verbal and visual presentation and models provide information about basic cardiac anatomy and function. Reviews the testing methods done to diagnose heart disease and the outcomes of the test results. Describes the treatment choices: Medical Management, Angioplasty, or Coronary Bypass Surgery for treating various heart conditions including Myocardial Infarction, Angina, Valve Disease, and Cardiac Arrhythmias.  Written material given at graduation. Flowsheet Row Cardiac Rehab from 03/30/2020 in [facility] Cardiac and Pulmonary Rehab  Date 03/30/20  Educator [facility]  Instruction Review Code 1- Verbalizes Understanding      Medication Safety: - Group verbal and visual instruction to review commonly prescribed medications for heart and lung disease. Reviews the medication, class of the drug, and side effects. Includes the steps to properly store meds and maintain the prescription regimen.   Written material given at graduation.   Intimacy: - Group verbal instruction through game format to discuss how heart and lung disease can affect sexual intimacy. Written material given at graduation..   Know Your Numbers and Heart Failure: -  Group verbal and visual instruction to discuss disease risk factors for cardiac and pulmonary disease and treatment options.  Reviews associated critical values for Overweight/Obesity, Hypertension, Cholesterol, and Diabetes.  Discusses basics of heart failure: signs/symptoms and treatments.  Introduces Heart Failure Zone chart for action plan for heart failure.  Written material given at graduation.   Infection Prevention: - Provides verbal and written material to individual with discussion of infection control including proper hand washing and proper equipment cleaning during exercise session. Flowsheet Row Cardiac Rehab from 03/30/2020 in [facility] Cardiac and Pulmonary Rehab  Date 03/22/20  Educator AS  Instruction Review Code 1- Verbalizes Understanding      Falls Prevention: - Provides verbal and written material to individual with discussion of falls prevention and safety. Flowsheet Row Cardiac Rehab from 03/30/2020 in [facility] Cardiac and Pulmonary Rehab  Date 03/22/20  Educator AS  Instruction Review Code 1- Verbalizes Understanding      Other: -Provides group and verbal instruction on various topics (see comments)   Knowledge Questionnaire Score:  Knowledge Questionnaire Score - 03/22/20 1514      Knowledge Questionnaire Score   Pre Score 22/26 angina, nutrition           Core Components/Risk Factors/Patient Goals at Admission:  Personal Goals and Risk Factors at Admission - 03/22/20 1517      Core Components/Risk Factors/Patient Goals on Admission    Weight Management Yes;Weight Loss    Intervention Weight Management: Develop a combined nutrition and exercise program designed to reach desired caloric intake, while maintaining  appropriate intake of nutrient and fiber, sodium and fats, and appropriate energy expenditure required for the weight goal.;Weight Management: Provide education and appropriate resources to help participant work on and attain dietary goals.;Weight Management/Obesity: Establish reasonable short term and long term weight goals.;Obesity: Provide education and appropriate resources to help participant work on and attain dietary goals.    Expected Outcomes Short Term: Continue to assess and modify interventions until short term weight is achieved;Long Term: Adherence to nutrition and physical activity/exercise program aimed toward attainment of established weight goal;Weight Loss: Understanding of general recommendations for a balanced deficit meal plan, which promotes 1-2 lb weight loss per week and includes a negative energy balance of [phone] kcal/d;Understanding recommendations for meals to include 15-35% energy as protein, 25-35% energy from fat, 35-60% energy from carbohydrates, less than 223m of dietary cholesterol, 20-35 gm of total fiber daily;Understanding of distribution of calorie intake throughout the day with the consumption of 4-5 meals/snacks    Diabetes Yes    Intervention Provide education about signs/symptoms and action to take for hypo/hyperglycemia.;Provide education about proper nutrition, including hydration, and aerobic/resistive exercise prescription along with prescribed medications to achieve blood glucose in normal ranges: Fasting glucose 65-99 mg/dL    Expected Outcomes Short Term: Participant verbalizes understanding of the signs/symptoms and immediate care of hyper/hypoglycemia, proper foot care and importance of medication, aerobic/resistive exercise and nutrition plan for blood glucose control.;Long Term: Attainment of HbA1C < 7%.    Hypertension Yes    Intervention Provide education on lifestyle modifcations including regular physical activity/exercise, weight management,  moderate sodium restriction and increased consumption of fresh fruit, vegetables, and low fat dairy, alcohol moderation, and smoking cessation.;Monitor prescription use compliance.    Expected Outcomes Short Term: Continued assessment and intervention until BP is < 140/956mHG in hypertensive participants. < 130/8016mG in hypertensive participants with diabetes, heart failure or chronic kidney disease.;Long Term: Maintenance of blood pressure at goal levels.  Lipids Yes    Intervention Provide education and support for participant on nutrition & aerobic/resistive exercise along with prescribed medications to achieve LDL <81m, HDL >4106m    Expected Outcomes Long Term: Cholesterol controlled with medications as prescribed, with individualized exercise RX and with personalized nutrition plan. Value goals: LDL < 7041mHDL > 40 mg.;Short Term: Participant states understanding of desired cholesterol values and is compliant with medications prescribed. Participant is following exercise prescription and nutrition guidelines.           Education:Diabetes - Individual verbal and written instruction to review signs/symptoms of diabetes, desired ranges of glucose level fasting, after meals and with exercise. Acknowledge that pre and post exercise glucose checks will be done for 3 sessions at entry of program. [place] [facility]  Date 03/21/20  [facility]ator [facility] Code 1- Verbalizes Understanding      Core Components/Risk Factors/Patient Goals Review:   Goals and Risk Factor Review    Row Name 04/18/20 1403             Core Components/Risk Factors/Patient Goals Review   Personal Goals Review Weight Management/Obesity;Hypertension;Diabetes       Review Amanda Wiley doing well. She checks her sugar once daily, usually in the morning and it ranges around 130s. She is aware to contact her doctor if she starts getting abnormal  numbers. She also checks her BP at home, reports systolic around [id number] diastolic 60-82-50Ntays compliant with taking BP at home and checks it several times per week. Stays compliants with all her medications. At this time Amanda Wiley  not trying to lose weight but thinks she needs to. She is meeting with the  RD after class today and looking foward to learning more. She did lose 30 lbs when she had COVID, but gained it right back. Doesn't weigh herself at home very often and encouraged her to maintain a log.       Expected Outcomes Short: Weigh herself at home more consistently Long: Continue to manage lifestyle risk factors              Core Components/Risk Factors/Patient Goals at Discharge (Final Review):   Goals and Risk Factor Review - 04/18/20 1403      Core Components/Risk Factors/Patient Goals Review   Personal Goals Review Weight Management/Obesity;Hypertension;Diabetes    Review Amanda Wiley doing well. She checks her sugar once daily, usually in the morning and it ranges around 130s. She is aware to contact her doctor if she starts getting abnormal numbers. She also checks her BP at home, reports systolic around [id number] diastolic 60-73-41Ptays compliant with taking BP at home and checks it several times per week. Stays compliants with all her medications. At this time Amanda Wiley  not trying to lose weight but thinks she needs to. She is meeting with the  RD after class today and looking foward to learning more. She did lose 30 lbs when she had COVID, but gained it right back. Doesn't weigh herself at home very often and encouraged her to maintain a log.    Expected Outcomes Short: Weigh herself at home more consistently Long: Continue to manage lifestyle risk factors           ITP Comments:  ITP Comments    Row Name 03/21/20 1342 03/22/20 1526 03/28/20 1336 04/06/20 0520 05/04/20 0844   ITP Comments Virtual Visit completed. Patient informed on EP and RD  appointment and 6 Minute walk test.  Patient also informed of patient health questionnaires on My Chart. Patient Verbalizes understanding. Visit diagnosis can be found in [facility]12/10/2019. Completed 6MWT and gym orientation. Initial ITP created and sent for review to Dr. Emily Filbert, Medical Director. First full day of exercise!  Patient was oriented to gym and equipment including functions, settings, policies, and procedures.  Patient's individual exercise prescription and treatment plan were reviewed.  All starting workloads were established based on the results of the 6 minute walk test done at initial orientation visit.  The plan for exercise progression was also introduced and progression will be customized based on patient's performance and goals. 30 Day review completed. Medical Director ITP review done, changes made as directed, and signed approval by Medical Director. 30 Day review completed. Medical Director ITP review done, changes made as directed, and signed approval by Medical Director.          Comments:

## 2020-05-11 ENCOUNTER — Other Ambulatory Visit: Payer: Self-pay

## 2020-05-11 ENCOUNTER — Encounter: Payer: Medicare Other | Attending: Cardiology

## 2020-05-11 DIAGNOSIS — I214 Non-ST elevation (NSTEMI) myocardial infarction: Secondary | ICD-10-CM | POA: Insufficient documentation

## 2020-05-11 NOTE — Progress Notes (Signed)
Daily Session Note  Patient Details  Name: Amanda Wiley MRN: [id number] Date of Birth: 1955-10-20 Referring Provider:   Flowsheet Row Cardiac Rehab from 03/22/2020 in [facility] Cardiac and Pulmonary Rehab  Referring Provider Fath      Encounter Date: 05/11/2020  Check In:  Session Check In - 05/11/20 1339      Check-In   Supervising physician immediately available to respond to emergencies See telemetry face sheet for immediately available ER MD    Location [facility]-Cardiac & Pulmonary Rehab    Staff Present Birdie Sons, MPA, RN;Joseph Lou Miner, [place] Exercise Physiologist    Virtual Visit No    Medication changes reported     No    Fall or balance concerns reported    No    Warm-up and Cool-down Performed on first and last piece of equipment    Resistance Training Performed Yes    VAD Patient? No    PAD/SET Patient? No      Pain Assessment   Currently in Pain? No/denies              Social History   Tobacco Use  Smoking Status Former Smoker  . Packs/day: 2.00  . Years: 15.00  . Pack years: 30.00  . Types: Cigarettes  . Quit date: 04/09/1997  . Years since quitting: 23.1  Smokeless Tobacco Never Used    Goals Met:  Independence with exercise equipment Exercise tolerated well No report of cardiac concerns or symptoms Strength training completed today  Goals Unmet:  Not Applicable  Comments: Pt able to follow exercise prescription today without complaint.  Will continue to monitor for progression.    Dr. Emily Filbert is Medical Director for [place] and [facility].

## 2020-05-12 ENCOUNTER — Encounter: Payer: Medicare Other | Admitting: *Deleted

## 2020-05-12 DIAGNOSIS — I214 Non-ST elevation (NSTEMI) myocardial infarction: Secondary | ICD-10-CM | POA: Diagnosis not present

## 2020-05-12 NOTE — Progress Notes (Signed)
Daily Session Note  Patient Details  Name: BRONWEN PENDERGRAFT MRN: [id number] Date of Birth: 06/29/1955 Referring Provider:   Flowsheet Row Cardiac Rehab from 03/22/2020 in [facility] Cardiac and Pulmonary Rehab  Referring Provider Fath      Encounter Date: 05/12/2020  Check In:  Session Check In - 05/12/20 1336      Check-In   Supervising physician immediately available to respond to emergencies See telemetry face sheet for immediately available ER MD    Location [facility]-Cardiac & Pulmonary Rehab    Staff Present Renita Papa, RN BSN;Joseph [address] [place], [place], [place], CCRP, CCET    Virtual Visit No    Medication changes reported     No    Fall or balance concerns reported    No    Warm-up and Cool-down Performed on first and last piece of equipment    Resistance Training Performed Yes    VAD Patient? No    PAD/SET Patient? No      Pain Assessment   Currently in Pain? No/denies              Social History   Tobacco Use  Smoking Status Former Smoker  . Packs/day: 2.00  . Years: 15.00  . Pack years: 30.00  . Types: Cigarettes  . Quit date: 04/09/1997  . Years since quitting: 23.1  Smokeless Tobacco Never Used    Goals Met:  Independence with exercise equipment Exercise tolerated well No report of cardiac concerns or symptoms Strength training completed today  Goals Unmet:  Not Applicable  Comments: Pt able to follow exercise prescription today without complaint.  Will continue to monitor for progression.    Dr. Emily Filbert is Medical Director for [place] and [facility].

## 2020-05-16 ENCOUNTER — Other Ambulatory Visit: Payer: Self-pay

## 2020-05-16 DIAGNOSIS — I214 Non-ST elevation (NSTEMI) myocardial infarction: Secondary | ICD-10-CM

## 2020-05-16 NOTE — Progress Notes (Signed)
Daily Session Note  Patient Details  Name: Amanda Wiley MRN: [id number] Date of Birth: [birth year] Referring Provider:   Flowsheet Row Cardiac Rehab from 03/22/2020 in [facility] Cardiac and Pulmonary Rehab  Referring Provider Fath      Encounter Date: 05/16/2020  Check In:  Session Check In - 05/16/20 1347      Check-In   Supervising physician immediately available to respond to emergencies See telemetry face sheet for immediately available ER MD    Location [facility]-Cardiac & Pulmonary Rehab    Staff Present Birdie Sons, MPA, Mauricia Area, BS, ACSM CEP, Exercise Physiologist;Kara Eliezer Bottom, MS Exercise Physiologist    Virtual Visit No    Medication changes reported     No    Fall or balance concerns reported    No    Warm-up and Cool-down Performed on first and last piece of equipment    Resistance Training Performed Yes    VAD Patient? No    PAD/SET Patient? No      Pain Assessment   Currently in Pain? No/denies              Social History   Tobacco Use  Smoking Status Former Smoker  . Packs/day: 2.00  . Years: 15.00  . Pack years: 30.00  . Types: Cigarettes  . Quit date: 04/09/1997  . Years since quitting: 23.1  Smokeless Tobacco Never Used    Goals Met:  Independence with exercise equipment Exercise tolerated well No report of cardiac concerns or symptoms Strength training completed today  Goals Unmet:  Not Applicable  Comments: Pt able to follow exercise prescription today without complaint.  Will continue to monitor for progression.    Dr. Emily Filbert is Medical Director for [place] and [facility].

## 2020-05-18 ENCOUNTER — Other Ambulatory Visit: Payer: Self-pay

## 2020-05-18 DIAGNOSIS — I214 Non-ST elevation (NSTEMI) myocardial infarction: Secondary | ICD-10-CM | POA: Diagnosis not present

## 2020-05-18 NOTE — Progress Notes (Signed)
Daily Session Note  Patient Details  Name: Amanda Wiley MRN: [id number] Date of Birth: [birth year] Referring Provider:   Flowsheet Row Cardiac Rehab from 03/22/2020 in [facility] Cardiac and Pulmonary Rehab  Referring Provider Fath      Encounter Date: 05/18/2020  Check In:  Session Check In - 05/18/20 1400      Check-In   Supervising physician immediately available to respond to emergencies See telemetry face sheet for immediately available ER MD    Location [facility]-Cardiac & Pulmonary Rehab    Staff Present Birdie Sons, MPA, RN;Joseph Lou Miner, MS Exercise Physiologist;Melissa Caiola RDN, LDN    Virtual Visit No    Medication changes reported     No    Fall or balance concerns reported    No    Warm-up and Cool-down Performed on first and last piece of equipment    Resistance Training Performed Yes    VAD Patient? No    PAD/SET Patient? No      Pain Assessment   Currently in Pain? No/denies              Social History   Tobacco Use  Smoking Status Former Smoker  . Packs/day: 2.00  . Years: 15.00  . Pack years: 30.00  . Types: Cigarettes  . Quit date: 04/09/1997  . Years since quitting: 23.1  Smokeless Tobacco Never Used    Goals Met:  Independence with exercise equipment Exercise tolerated well No report of cardiac concerns or symptoms Strength training completed today  Goals Unmet:  Not Applicable  Comments: Pt able to follow exercise prescription today without complaint.  Will continue to monitor for progression.    Dr. Emily Filbert is Medical Director for [place] and [facility].

## 2020-05-19 ENCOUNTER — Other Ambulatory Visit: Payer: Self-pay

## 2020-05-19 ENCOUNTER — Telehealth: Payer: Medicare Other | Admitting: Internal Medicine

## 2020-05-19 ENCOUNTER — Encounter: Payer: Medicare Other | Admitting: *Deleted

## 2020-05-19 DIAGNOSIS — I214 Non-ST elevation (NSTEMI) myocardial infarction: Secondary | ICD-10-CM | POA: Diagnosis not present

## 2020-05-19 NOTE — Progress Notes (Signed)
Daily Session Note  Patient Details  Name: Amanda Wiley MRN: [id number] Date of Birth: 11/30/1955 Referring Provider:   Flowsheet Row Cardiac Rehab from 03/22/2020 in [facility] Cardiac and Pulmonary Rehab  Referring Provider Fath      Encounter Date: 05/19/2020  Check In:  Session Check In - 05/19/20 1328      Check-In   Supervising physician immediately available to respond to emergencies See telemetry face sheet for immediately available ER MD    Location [facility]-Cardiac & Pulmonary Rehab    Staff Present Renita Papa, RN BSN;Joseph [address] [place], [place], [place], CCRP, CCET    Virtual Visit No    Medication changes reported     No    Fall or balance concerns reported    No    Warm-up and Cool-down Performed on first and last piece of equipment    Resistance Training Performed Yes    VAD Patient? No    PAD/SET Patient? No      Pain Assessment   Currently in Pain? No/denies              Social History   Tobacco Use  Smoking Status Former Smoker  . Packs/day: 2.00  . Years: 15.00  . Pack years: 30.00  . Types: Cigarettes  . Quit date: 04/09/1997  . Years since quitting: 23.1  Smokeless Tobacco Never Used    Goals Met:  Independence with exercise equipment Exercise tolerated well No report of cardiac concerns or symptoms Strength training completed today  Goals Unmet:  Not Applicable  Comments: Pt able to follow exercise prescription today without complaint.  Will continue to monitor for progression.    Dr. Emily Filbert is Medical Director for [place] and [facility].

## 2020-05-23 ENCOUNTER — Other Ambulatory Visit: Payer: Self-pay

## 2020-05-23 DIAGNOSIS — I214 Non-ST elevation (NSTEMI) myocardial infarction: Secondary | ICD-10-CM | POA: Diagnosis not present

## 2020-05-23 NOTE — Progress Notes (Signed)
Daily Session Note  Patient Details  Name: Amanda Wiley MRN: [id number] Date of Birth: Nov 19, 1955 Referring Provider:   Flowsheet Row Cardiac Rehab from 03/22/2020 in [facility] Cardiac and Pulmonary Rehab  Referring Provider Fath      Encounter Date: 05/23/2020  Check In:  Session Check In - 05/23/20 1344      Check-In   Supervising physician immediately available to respond to emergencies See telemetry face sheet for immediately available ER MD    Location [facility]-Cardiac & Pulmonary Rehab    Staff Present Birdie Sons, MPA, Mauricia Area, BS, ACSM CEP, Exercise Physiologist;Kara Eliezer Bottom, MS Exercise Physiologist    Virtual Visit No    Medication changes reported     No    Fall or balance concerns reported    No    Warm-up and Cool-down Performed on first and last piece of equipment    Resistance Training Performed Yes    VAD Patient? No    PAD/SET Patient? No      Pain Assessment   Currently in Pain? No/denies              Social History   Tobacco Use  Smoking Status Former Smoker  . Packs/day: 2.00  . Years: 15.00  . Pack years: 30.00  . Types: Cigarettes  . Quit date: 04/09/1997  . Years since quitting: 23.1  Smokeless Tobacco Never Used    Goals Met:  Independence with exercise equipment Exercise tolerated well No report of cardiac concerns or symptoms Strength training completed today  Goals Unmet:  Not Applicable  Comments: Pt able to follow exercise prescription today without complaint.  Will continue to monitor for progression.    Dr. Emily Filbert is Medical Director for [place] and [facility].

## 2020-05-25 ENCOUNTER — Other Ambulatory Visit: Payer: Self-pay

## 2020-05-25 DIAGNOSIS — I214 Non-ST elevation (NSTEMI) myocardial infarction: Secondary | ICD-10-CM

## 2020-05-25 NOTE — Progress Notes (Signed)
Daily Session Note  Patient Details  Name: CAMIL HAUSMANN MRN: [id number] Date of Birth: [birth year] Referring Provider:   Flowsheet Row Cardiac Rehab from 03/22/2020 in [facility] Cardiac and Pulmonary Rehab  Referring Provider Fath      Encounter Date: 05/25/2020  Check In:  Session Check In - 05/25/20 1338      Check-In   Supervising physician immediately available to respond to emergencies See telemetry face sheet for immediately available ER MD    Location [facility]-Cardiac & Pulmonary Rehab    Staff Present Birdie Sons, MPA, RN;Joseph Lou Miner, [place] Exercise Physiologist    Virtual Visit No    Medication changes reported     No    Fall or balance concerns reported    No    Warm-up and Cool-down Performed on first and last piece of equipment    Resistance Training Performed Yes    VAD Patient? No    PAD/SET Patient? No      Pain Assessment   Currently in Pain? No/denies              Social History   Tobacco Use  Smoking Status Former Smoker  . Packs/day: 2.00  . Years: 15.00  . Pack years: 30.00  . Types: Cigarettes  . Quit date: 04/09/1997  . Years since quitting: 23.1  Smokeless Tobacco Never Used    Goals Met:  Independence with exercise equipment Exercise tolerated well No report of cardiac concerns or symptoms Strength training completed today  Goals Unmet:  Not Applicable  Comments: Pt able to follow exercise prescription today without complaint.  Will continue to monitor for progression.    Dr. Emily Filbert is Medical Director for [place] and [facility].

## 2020-05-26 ENCOUNTER — Other Ambulatory Visit: Payer: Self-pay

## 2020-05-26 ENCOUNTER — Encounter: Payer: Medicare Other | Admitting: *Deleted

## 2020-05-26 DIAGNOSIS — I214 Non-ST elevation (NSTEMI) myocardial infarction: Secondary | ICD-10-CM

## 2020-05-26 NOTE — Progress Notes (Signed)
Daily Session Note  Patient Details  Name: Amanda Wiley MRN: [id number] Date of Birth: 21-Jul-1955 Referring Provider:   Flowsheet Row Cardiac Rehab from 03/22/2020 in [facility] Cardiac and Pulmonary Rehab  Referring Provider Fath      Encounter Date: 05/26/2020  Check In:  Session Check In - 05/26/20 1334      Check-In   Supervising physician immediately available to respond to emergencies See telemetry face sheet for immediately available ER MD    Location [facility]-Cardiac & Pulmonary Rehab    Staff Present Renita Papa, RN BSN;Joseph [address] [place], [place], [place], CCRP, CCET    Virtual Visit No    Medication changes reported     No    Fall or balance concerns reported    No    Warm-up and Cool-down Performed on first and last piece of equipment    Resistance Training Performed Yes    VAD Patient? No    PAD/SET Patient? No      Pain Assessment   Currently in Pain? No/denies              Social History   Tobacco Use  Smoking Status Former Smoker  . Packs/day: 2.00  . Years: 15.00  . Pack years: 30.00  . Types: Cigarettes  . Quit date: 04/09/1997  . Years since quitting: 23.1  Smokeless Tobacco Never Used    Goals Met:  Independence with exercise equipment Exercise tolerated well No report of cardiac concerns or symptoms Strength training completed today  Goals Unmet:  Not Applicable  Comments: Pt able to follow exercise prescription today without complaint.  Will continue to monitor for progression.    Dr. Emily Filbert is Medical Director for [place] and [facility].

## 2020-05-30 ENCOUNTER — Other Ambulatory Visit: Payer: Self-pay

## 2020-05-30 DIAGNOSIS — I214 Non-ST elevation (NSTEMI) myocardial infarction: Secondary | ICD-10-CM | POA: Diagnosis not present

## 2020-05-30 NOTE — Progress Notes (Signed)
Daily Session Note  Patient Details  Name: Amanda Wiley MRN: [id number] Date of Birth: 31-Oct-1955 Referring Provider:   Flowsheet Row Cardiac Rehab from 03/22/2020 in [facility] Cardiac and Pulmonary Rehab  Referring Provider Fath      Encounter Date: 05/30/2020  Check In:  Session Check In - 05/30/20 1352      Check-In   Supervising physician immediately available to respond to emergencies See telemetry face sheet for immediately available ER MD    Location [facility]-Cardiac & Pulmonary Rehab    Staff Present Birdie Sons, MPA, Mauricia Area, BS, ACSM CEP, Exercise Physiologist;Kara Eliezer Bottom, MS Exercise Physiologist    Virtual Visit No    Medication changes reported     No    Fall or balance concerns reported    No    Warm-up and Cool-down Performed on first and last piece of equipment    Resistance Training Performed Yes    VAD Patient? No    PAD/SET Patient? No      Pain Assessment   Currently in Pain? No/denies              Social History   Tobacco Use  Smoking Status Former Smoker  . Packs/day: 2.00  . Years: 15.00  . Pack years: 30.00  . Types: Cigarettes  . Quit date: 04/09/1997  . Years since quitting: 23.1  Smokeless Tobacco Never Used    Goals Met:  Independence with exercise equipment Exercise tolerated well No report of cardiac concerns or symptoms Strength training completed today  Goals Unmet:  Not Applicable  Comments: Pt able to follow exercise prescription today without complaint.  Will continue to monitor for progression.    Dr. Emily Filbert is Medical Director for [place] and [facility].

## 2020-06-01 ENCOUNTER — Encounter: Payer: Self-pay | Admitting: *Deleted

## 2020-06-01 DIAGNOSIS — I214 Non-ST elevation (NSTEMI) myocardial infarction: Secondary | ICD-10-CM

## 2020-06-01 NOTE — Progress Notes (Signed)
Cardiac Individual Treatment Plan  Patient Details  Name: Amanda Wiley MRN: [id number] Date of Birth: 19-Jul-1955 Referring Provider:   Flowsheet Row Cardiac Rehab from 03/22/2020 in [facility] Cardiac and Pulmonary Rehab  Referring Provider Fath      Initial Encounter Date:  Flowsheet Row Cardiac Rehab from 03/22/2020 in [facility] Cardiac and Pulmonary Rehab  Date 03/22/20      Visit Diagnosis: NSTEMI (non-ST elevated myocardial infarction) [facility])  Patient's Home Medications on Admission:  Current Outpatient Medications:  .  acetaminophen (TYLENOL) 325 MG tablet, Take 2 tablets (650 mg total) by mouth every 6 (six) hours as needed for mild pain (or Fever >/= 101)., Disp:  , Rfl:  .  albuterol (PROVENTIL) (2.5 MG/3ML) 0.083% nebulizer solution, Take 2.5 mg by nebulization every 6 (six) hours as needed for wheezing., Disp: , Rfl:  .  amLODipine (NORVASC) 5 MG tablet, Take 1 tablet (5 mg total) by mouth daily., Disp: 30 tablet, Rfl: 0 .  amLODipine (NORVASC) 5 MG tablet, Take 1 tablet by mouth daily., Disp: , Rfl:  .  aspirin 81 MG chewable tablet, Chew 1 tablet (81 mg total) by mouth daily., Disp: 30 tablet, Rfl: 0 .  aspirin 81 MG chewable tablet, Chew 1 tablet by mouth daily., Disp: , Rfl:  .  buPROPion (WELLBUTRIN XL) 300 MG 24 hr tablet, Take 1 tablet (300 mg total) by mouth daily., Disp: 90 tablet, Rfl: 1 .  Dulaglutide (TRULICITY) 1.5 RF/1.6BW SOPN, Inject 1.5 mg into the skin once a week. ON SUNDAYS, Disp: , Rfl:  .  fluticasone-salmeterol (ADVAIR HFA) 230-21 MCG/ACT inhaler, Inhale 2 puffs into the lungs 2 (two) times daily., Disp: , Rfl:  .  hyoscyamine (LEVSIN SL) 0.125 MG SL tablet, Place 0.125 mg under the tongue as needed., Disp: , Rfl:  .  insulin glargine (LANTUS) 100 UNIT/ML injection, Inject 20 Units into the skin at bedtime. , Disp: , Rfl:  .  ipratropium (ATROVENT) 0.06 % nasal spray, Place 2 sprays into both nostrils 3 (three) times daily as needed for rhinitis.  (Patient not  taking: Reported on 03/21/2020), Disp: , Rfl:  .  Ipratropium-Albuterol (COMBIVENT) 20-100 MCG/ACT AERS respimat, Inhale 2 puffs into the lungs every 4 (four) hours while awake. (Patient taking differently: Inhale 1 puff into the lungs 2 (two) times daily. And as needed as a rescue inhaler.), Disp: 4 g, Rfl: 2 .  isosorbide mononitrate (IMDUR) 30 MG 24 hr tablet, Take 1 tablet (30 mg total) by mouth daily., Disp: 30 tablet, Rfl: 0 .  JARDIANCE 25 MG TABS tablet, Take 25 mg by mouth daily., Disp: , Rfl:  .  losartan (COZAAR) 25 MG tablet, Take 1 tablet (25 mg total) by mouth daily., Disp: 30 tablet, Rfl: 0 .  metoprolol tartrate (LOPRESSOR) 25 MG tablet, Take 0.5 tablets (12.5 mg total) by mouth 2 (two) times daily., Disp: 30 tablet, Rfl: 0 .  nitroGLYCERIN (NITROSTAT) 0.4 MG SL tablet, Place 1 tablet (0.4 mg total) under the tongue every 5 (five) minutes as needed for chest pain., Disp: 30 tablet, Rfl: 0 .  prasugrel (EFFIENT) 10 MG TABS tablet, Take 1 tablet (10 mg total) by mouth daily., Disp: 30 tablet, Rfl: 0 .  RABEprazole (ACIPHEX) 20 MG tablet, Take 1 tablet (20 mg total) by mouth in the morning and at bedtime., Disp: 180 tablet, Rfl: 1 .  rosuvastatin (CRESTOR) 5 MG tablet, Take 1 tablet (5 mg total) by mouth daily., Disp: 30 tablet, Rfl: 0 .  ULTRACARE  PEN NEEDLES 32G X 4 MM MISC, , Disp: , Rfl:  .  vitamin C (VITAMIN C) 1000 MG tablet, Take 1 tablet (1,000 mg total) by mouth daily., Disp:  , Rfl:   Past Medical History: Past Medical History:  Diagnosis Date  . Arthritis   . Asthma   . Complication of anesthesia    HARD TO WAKE UP  . COPD (chronic obstructive pulmonary disease) ([place])   . Crohn's disease ([place])   . Depression   . Diabetes mellitus ([place])   . Fracture, foot    post fall  . Hypercholesterolemia   . Hypertension   . IBS (irritable bowel syndrome)   . Nephrolithiasis    s/p lithotripsy [facility])  . Vitamin D deficiency     Tobacco Use: Social  History   Tobacco Use  Smoking Status Former Smoker  . Packs/day: 2.00  . Years: 15.00  . Pack years: 30.00  . Types: Cigarettes  . Quit date: 04/09/1997  . Years since quitting: 23.1  Smokeless Tobacco Never Used    Labs: Recent Chemical engineer    Labs for ITP Cardiac and Pulmonary Rehab Latest Ref Rng & Units 01/13/2019 02/02/2019 06/02/2019 10/14/2019 02/12/2020   Cholestrol 0 - 200 mg/dL - - 208(H) 210(H) 222(H)   LDLCALC 0 - 99 mg/dL - - - - -   LDLDIRECT mg/dL - - 120.0 128.0 149.0   HDL >39.00 mg/dL - - 39.20 41.20 40.50   Trlycerides 0.0 - 149.0 mg/dL - - 396.0(H) 324.0(H) 330.0(H)   Hemoglobin A1c 4.6 - 6.5 % 7.3(H) - 7.0(H) 7.6(H) 7.7(H)   PHART 7.350 - 7.450 - 7.50(H) - - -   PCO2ART 32.0 - 48.0 mmHg - 39 - - -   HCO3 20.0 - 28.0 mmol/L - 30.4(H) - - -   O2SAT % - 92.8 - - -       Exercise Target Goals: Exercise Program Goal: Individual exercise prescription set using results from initial 6 min walk test and THRR while considering  patient's activity barriers and safety.   Exercise Prescription Goal: Initial exercise prescription builds to 30-45 minutes a day of aerobic activity, 2-3 days per week.  Home exercise guidelines will be given to patient during program as part of exercise prescription that the participant will acknowledge.   Education: Aerobic Exercise: - Group verbal and visual presentation on the components of exercise prescription. Introduces F.I.T.T principle from ACSM for exercise prescriptions.  Reviews F.I.T.T. principles of aerobic exercise including progression. Written material given at graduation.   Education: Resistance Exercise: - Group verbal and visual presentation on the components of exercise prescription. Introduces F.I.T.T principle from ACSM for exercise prescriptions  Reviews F.I.T.T. principles of resistance exercise including progression. Written material given at graduation. Flowsheet Row Cardiac Rehab from 05/11/2020 in [facility]  Cardiac and Pulmonary Rehab  Date 03/30/20  Educator AS  Instruction Review Code 1- Verbalizes Understanding       Education: Exercise & Equipment Safety: - Individual verbal instruction and demonstration of equipment use and safety with use of the equipment. Flowsheet Row Cardiac Rehab from 05/11/2020 in [facility] Cardiac and Pulmonary Rehab  Date 03/22/20  Educator AS  Instruction Review Code 1- Verbalizes Understanding      Education: Exercise Physiology & General Exercise Guidelines: - Group verbal and written instruction with models to review the exercise physiology of the cardiovascular system and associated critical values. Provides general exercise guidelines with specific guidelines to those with heart or lung disease.  Education: Flexibility, Balance, Mind/Body Relaxation: - Group verbal and visual presentation with interactive activity on the components of exercise prescription. Introduces F.I.T.T principle from ACSM for exercise prescriptions. Reviews F.I.T.T. principles of flexibility and balance exercise training including progression. Also discusses the mind body connection.  Reviews various relaxation techniques to help reduce and manage stress (i.e. Deep breathing, progressive muscle relaxation, and visualization). Balance handout provided to take home. Written material given at graduation.   Activity Barriers & Risk Stratification:   6 Minute Walk:  6 Minute Walk    Row Name 03/22/20 1503         6 Minute Walk   Phase Initial     Distance 1170 feet     Walk Time 6 minutes     # of Rest Breaks 0     MPH 2.2     METS 3.2     RPE 8     Perceived Dyspnea  1     VO2 Peak 11.27     Symptoms Yes (comment)     Comments slightly SOB     Resting HR 64 bpm     Resting BP 138/68     Resting Oxygen Saturation  95 %     Exercise Oxygen Saturation  during 6 min walk 91 %     Max Ex. HR 113 bpm     Max Ex. BP 208/62     2 Minute Post BP 124/68            Oxygen  Initial Assessment:   Oxygen Re-Evaluation:   Oxygen Discharge (Final Oxygen Re-Evaluation):   Initial Exercise Prescription:  Initial Exercise Prescription - 03/22/20 1500      Date of Initial Exercise RX and Referring Provider   Date 03/22/20    Referring Provider Fath      Treadmill   MPH 2    Grade 1    Minutes 15    METs 2.8      Recumbant Bike   Level 1    RPM 60    Watts 38    Minutes 15    METs 3.2      Arm Ergometer   Level 1    RPM 25    Minutes 15    METs 3      REL-XR   Level 2    Speed 50    Minutes 15    METs 3.2      Prescription Details   Frequency (times per week) 3    Duration Progress to 30 minutes of continuous aerobic without signs/symptoms of physical distress      Intensity   THRR 40-80% of Max Heartrate 100-136    Ratings of Perceived Exertion 11-13    Perceived Dyspnea 0-4      Resistance Training   Training Prescription Yes    Weight 3 lb    Reps 10-15           Perform Capillary Blood Glucose checks as needed.  Exercise Prescription Changes:  Exercise Prescription Changes    Row Name 03/22/20 1500 04/04/20 1600 04/18/20 1000 04/18/20 1500 05/04/20 1300     Response to Exercise   Blood Pressure (Admit) 138/68 122/68 140/80 -- 126/64   Blood Pressure (Exercise) 208/62 174/78 180/76 -- 158/80   Blood Pressure (Exit) 124/68 120/66 134/60 -- 132/80   Heart Rate (Admit) 64 bpm 103 bpm 95 bpm -- 86 bpm   Heart Rate (Exercise) 113 bpm 117 bpm 119 bpm --  113 bpm   Heart Rate (Exit) 84 bpm 81 bpm 86 bpm -- 88 bpm   Oxygen Saturation (Admit) 95 % -- -- -- --   Oxygen Saturation (Exercise) 91 % -- -- -- --   Oxygen Saturation (Exit) 96 % -- -- -- --   Rating of Perceived Exertion (Exercise) 8 13 11  -- 11   Perceived Dyspnea (Exercise) 1 -- -- -- --   Symptoms SOB none none -- none   Comments -- fourth session -- -- --   Duration -- Continue with 30 min of aerobic exercise without signs/symptoms of physical distress.  Continue with 30 min of aerobic exercise without signs/symptoms of physical distress. -- Continue with 30 min of aerobic exercise without signs/symptoms of physical distress.   Intensity -- THRR unchanged THRR unchanged -- THRR unchanged     Progression   Progression -- Continue to progress workloads to maintain intensity without signs/symptoms of physical distress. Continue to progress workloads to maintain intensity without signs/symptoms of physical distress. -- Continue to progress workloads to maintain intensity without signs/symptoms of physical distress.   Average METs -- 2.45 2.11 -- 2.3     Resistance Training   Training Prescription -- Yes Yes -- Yes   Weight -- 3 lb 3 lb -- 3 lb   Reps -- 10-15 10-15 -- 10-15     Interval Training   Interval Training -- -- -- -- No     Treadmill   MPH -- 1.9 2 -- 2.1   Grade -- 1 1 -- 1   Minutes -- 15 15 -- 15   METs -- 2.63 2.72 -- 2.81     Recumbant Elliptical   Level -- -- -- -- 1.7   Minutes -- -- -- -- 15   METs -- -- -- -- 1.8     REL-XR   Level -- 2 1 -- 3   Speed -- 50 50 -- --   Minutes -- 15 15 -- 15   METs -- 2.1 1.5 -- 2.3     Home Exercise Plan   Plans to continue exercise at -- -- -- Home (comment)  YouTube videos Home (comment)  YouTube videos   Frequency -- -- -- Add 2 additional days to program exercise sessions.  Start with 1 extra day Add 2 additional days to program exercise sessions.  Start with 1 extra day   Initial Home Exercises Provided -- -- -- 04/18/20 04/18/20   Row Name 05/16/20 1600 05/31/20 1600           Response to Exercise   Blood Pressure (Admit) 126/74 122/72      Blood Pressure (Exercise) 146/74 144/70      Blood Pressure (Exit) 124/60 112/68      Heart Rate (Admit) 81 bpm 70 bpm      Heart Rate (Exercise) 126 bpm 106 bpm      Heart Rate (Exit) 94 bpm 86 bpm      Rating of Perceived Exertion (Exercise) 12 11      Symptoms none none      Duration Continue with 30 min of aerobic exercise  without signs/symptoms of physical distress. Continue with 30 min of aerobic exercise without signs/symptoms of physical distress.      Intensity THRR unchanged THRR unchanged             Progression   Progression Continue to progress workloads to maintain intensity without signs/symptoms of physical distress. Continue to progress workloads to maintain  intensity without signs/symptoms of physical distress.      Average METs 2.8 2.66             Resistance Training   Training Prescription Yes Yes      Weight 3 lb 3 lb      Reps 10-15 10-15             Interval Training   Interval Training -- No             Treadmill   MPH 2 2      Grade 1 1      Minutes 15 15      METs 2.8 2.99             Recumbant Elliptical   Level -- 1      Minutes -- 15      METs -- 2             REL-XR   Level 5 5      Speed 50 --      Minutes 15 15      METs 2.8 3             Home Exercise Plan   Plans to continue exercise at -- Home (comment)  YouTube videos      Frequency -- Add 2 additional days to program exercise sessions.  Start with 1 extra day      Initial Home Exercises Provided -- 04/18/20             Exercise Comments:  Exercise Comments    Row Name 03/28/20 1336           Exercise Comments First full day of exercise!  Patient was oriented to gym and equipment including functions, settings, policies, and procedures.  Patient's individual exercise prescription and treatment plan were reviewed.  All starting workloads were established based on the results of the 6 minute walk test done at initial orientation visit.  The plan for exercise progression was also introduced and progression will be customized based on patient's performance and goals.              Exercise Goals and Review:  Exercise Goals    Row Name 03/22/20 1510             Exercise Goals   Increase Physical Activity Yes       Intervention Provide advice, education, support and counseling about  physical activity/exercise needs.;Develop an individualized exercise prescription for aerobic and resistive training based on initial evaluation findings, risk stratification, comorbidities and participant's personal goals.       Expected Outcomes Short Term: Attend rehab on a regular basis to increase amount of physical activity.;Long Term: Add in home exercise to make exercise part of routine and to increase amount of physical activity.;Long Term: Exercising regularly at least 3-5 days a week.       Increase Strength and Stamina Yes       Intervention Provide advice, education, support and counseling about physical activity/exercise needs.;Develop an individualized exercise prescription for aerobic and resistive training based on initial evaluation findings, risk stratification, comorbidities and participant's personal goals.       Expected Outcomes Short Term: Increase workloads from initial exercise prescription for resistance, speed, and METs.;Short Term: Perform resistance training exercises routinely during rehab and add in resistance training at home;Long Term: Improve cardiorespiratory fitness, muscular endurance and strength as measured by increased METs and functional capacity (6MWT)  Able to understand and use rate of perceived exertion (RPE) scale Yes       Intervention Provide education and explanation on how to use RPE scale       Expected Outcomes Short Term: Able to use RPE daily in rehab to express subjective intensity level;Long Term:  Able to use RPE to guide intensity level when exercising independently       Able to understand and use Dyspnea scale Yes       Intervention Provide education and explanation on how to use Dyspnea scale       Expected Outcomes Short Term: Able to use Dyspnea scale daily in rehab to express subjective sense of shortness of breath during exertion;Long Term: Able to use Dyspnea scale to guide intensity level when exercising independently       Knowledge  and understanding of Target Heart Rate Range (THRR) Yes       Intervention Provide education and explanation of THRR including how the numbers were predicted and where they are located for reference       Expected Outcomes Short Term: Able to state/look up THRR;Short Term: Able to use daily as guideline for intensity in rehab;Long Term: Able to use THRR to govern intensity when exercising independently       Able to check pulse independently Yes       Intervention Provide education and demonstration on how to check pulse in carotid and radial arteries.;Review the importance of being able to check your own pulse for safety during independent exercise       Expected Outcomes Short Term: Able to explain why pulse checking is important during independent exercise;Long Term: Able to check pulse independently and accurately       Understanding of Exercise Prescription Yes       Intervention Provide education, explanation, and written materials on patient's individual exercise prescription       Expected Outcomes Short Term: Able to explain program exercise prescription;Long Term: Able to explain home exercise prescription to exercise independently              Exercise Goals Re-Evaluation :  Exercise Goals Re-Evaluation    Row Name 03/28/20 1336 04/04/20 1652 04/18/20 1014 04/18/20 1342 04/18/20 1424     Exercise Goal Re-Evaluation   Exercise Goals Review Increase Physical Activity;Able to understand and use rate of perceived exertion (RPE) scale;Knowledge and understanding of Target Heart Rate Range (THRR);Understanding of Exercise Prescription;Increase Strength and Stamina;Able to understand and use Dyspnea scale;Able to check pulse independently Increase Physical Activity;Increase Strength and Stamina Increase Physical Activity;Increase Strength and Stamina Increase Physical Activity;Increase Strength and Stamina Increase Physical Activity;Increase Strength and Stamina;Understanding of Exercise  Prescription   Comments Reviewed RPE and dyspnea scales, THR and program prescription with pt today.  Pt voiced understanding and was given a copy of goals to take home. Lin has tolerated exercise well in her first sessions.  She has gradually increase TM from 1.36mh/0% to 1.9 mph and 1 %.  She works in [employer].  We will monitor progress. LKhristiecontinues to tolerate exercise well.  Staff will monitor progress. -- Reviewed home exercise with pt today.  Pt plans to complete home Youtube videos for exercise.  Reviewed THR, pulse, RPE, sign and symptoms, pulse oximetery and when to call 911 or MD.  Also discussed weather considerations and indoor options.  Pt voiced understanding. Advised patient to gradually increase her home exercise to +1 day/week, and slowly work her duration up.  Expected Outcomes Short: Use RPE daily to regulate intensity. Long: Follow program prescription in THR. Short: attend consistently Long: improve overall stamina Short: attned consistently Long: increase stamina -- Short: Add in 1 day of exercise at home Long: Ability to exercise at home independently without any complications   Row Name 05/04/20 1352 05/12/20 1339 05/16/20 1652 05/31/20 1611       Exercise Goal Re-Evaluation   Exercise Goals Review Increase Physical Activity;Increase Strength and Stamina;Understanding of Exercise Prescription Increase Physical Activity;Increase Strength and Stamina;Understanding of Exercise Prescription Increase Physical Activity;Increase Strength and Stamina Increase Physical Activity;Increase Strength and Stamina;Understanding of Exercise Prescription    Comments Airis is doing well in rehab.  She is up to level 1.7 on the recumbent elliptical. We will continue to monitor her progress. Lyliana is doing well in rehab.  She is not doing much exercise at home as she is in a little funk currently.  We talked about using exercise to help with depression.  She is going to try again and talk with her  doctor. Timika is progressing well and has improved her MET level by increasing load on XR.  Staff willencourage her to increase weights for strength work.  She works in correct THR range. Iris is doing well in rehab.  She is up to 2 METs on the recumbent elliptical.  We will continue to encourage her to try 4 lb weights and monitor her progress.    Expected Outcomes Short: Continue to move up workloads slowly  Long: Continue to build stamina Short: Start walking again at home Long; Continue to get back to routine. Short:  continue to exercise consistently Long:  increase overall stamina Short: Increase to 4 lb weights Long: Continue to improve stamina.           Discharge Exercise Prescription (Final Exercise Prescription Changes):  Exercise Prescription Changes - 05/31/20 1600      Response to Exercise   Blood Pressure (Admit) 122/72    Blood Pressure (Exercise) 144/70    Blood Pressure (Exit) 112/68    Heart Rate (Admit) 70 bpm    Heart Rate (Exercise) 106 bpm    Heart Rate (Exit) 86 bpm    Rating of Perceived Exertion (Exercise) 11    Symptoms none    Duration Continue with 30 min of aerobic exercise without signs/symptoms of physical distress.    Intensity THRR unchanged      Progression   Progression Continue to progress workloads to maintain intensity without signs/symptoms of physical distress.    Average METs 2.66      Resistance Training   Training Prescription Yes    Weight 3 lb    Reps 10-15      Interval Training   Interval Training No      Treadmill   MPH 2    Grade 1    Minutes 15    METs 2.99      Recumbant Elliptical   Level 1    Minutes 15    METs 2      REL-XR   Level 5    Minutes 15    METs 3      Home Exercise Plan   Plans to continue exercise at Home (comment)   YouTube videos   Frequency Add 2 additional days to program exercise sessions.   Start with 1 extra day   Initial Home Exercises Provided 04/18/20           Nutrition:  Target  Goals:  Understanding of nutrition guidelines, daily intake of sodium <1532m, cholesterol <2075m calories 30% from fat and 7% or less from saturated fats, daily to have 5 or more servings of fruits and vegetables.  Education: All About Nutrition: -Group instruction provided by verbal, written material, interactive activities, discussions, models, and posters to present general guidelines for heart healthy nutrition including fat, fiber, MyPlate, the role of sodium in heart healthy nutrition, utilization of the nutrition label, and utilization of this knowledge for meal planning. Follow up email sent as well. Written material given at graduation.   Biometrics:  Pre Biometrics - 03/22/20 1511      Pre Biometrics   Height 5' 5.5" (1.664 m)    Weight 219 lb 4.8 oz (99.5 kg)    BMI (Calculated) 35.93    Single Leg Stand 5 seconds            Nutrition Therapy Plan and Nutrition Goals:  Nutrition Therapy & Goals - 04/18/20 1435      Nutrition Therapy   Diet Heart healthy, low Na    Protein (specify units) 80g    Fiber 25 grams    Whole Grain Foods 3 servings    Saturated Fats 12 max. grams    Fruits and Vegetables 8 servings/day    Sodium 1.5 grams      Personal Nutrition Goals   Nutrition Goal ST: when making pot roast, make extra tray of non starcy vegetbales husband may not like, cook 1 extra time during the week like omelette with vegetables, whole wheat toast, fruit LT: lower saturated fat, limit red meat and processed week, fruit and vegetable intake 8x/day    Comments She cut back on using salt after cooking (still cooks with salt), cut back on fried foods). She will cook to cater to her husbands needs (he has an ostomy); she loves brussells sprouts, greens, corn, cabbage, homemade vegetable soup. She will make vegetable soup and freeze it. Sleep in until 930, 10am - coffee (sweet n' low,non-dairy creamer). Drinks: diet caffiene free coke. L: bologna sandwich, leftovers (chinese,  broccoli cheddar soup for example D: go out most often: taco salads, quarter pounder (someitmes), cheeseburger. Soups (homemade potato soup 2x/week), tomato soup and grilled cheese. Sundays she will make a roast, vegetables, cornbread with gravy, mini raviolis, salads (chef) - she normally cooks those days. S: popcorn (white cheddar) - sometimesonly simply salted, nabs, little debbie cakes; she enjoys salty over sweet. She eats when shes not hungry, something looks good or she is craving something. She sometimes feels overwhelmed with the chnages she has to make, discussed small manageable changes. Discussed heart healhty eating.      Intervention Plan   Intervention Prescribe, educate and counsel regarding individualized specific dietary modifications aiming towards targeted core components such as weight, hypertension, lipid management, diabetes, heart failure and other comorbidities.;Nutrition handout(s) given to patient.    Expected Outcomes Short Term Goal: Understand basic principles of dietary content, such as calories, fat, sodium, cholesterol and nutrients.;Short Term Goal: A plan has been developed with personal nutrition goals set during dietitian appointment.;Long Term Goal: Adherence to prescribed nutrition plan.           Nutrition Assessments:  MEDIFICTS Score Key:  ?70 Need to make dietary changes   40-70 Heart Healthy Diet  ? 40 Therapeutic Level Cholesterol Diet  [facility]  Picture Your Plate Total Score on Admission 32     Picture Your  Plate Scores:  <65 Unhealthy dietary pattern with much room for improvement.  41-50 Dietary pattern unlikely to meet recommendations for good health and room for improvement.  51-60 More healthful dietary pattern, with some room for improvement.   >60 Healthy dietary pattern, although there may be some specific behaviors that could be improved.    Nutrition Goals  Re-Evaluation:  Nutrition Goals Re-Evaluation    [place] Name 05/12/20 1352             Goals   Nutrition Goal ST: when making pot roast, make extra tray of non starcy vegetbales husband may not like, cook 1 extra time during the week like omelette with vegetables, whole wheat toast, fruit LT: lower saturated fat, limit red meat and processed week, fruit and vegetable intake 8x/day       Comment Bradley is struggling with her depression currently.  She is eating more than normal.  She was able to try a new pot roast recipe and then made vegetable stew with leftovers. She is trying to do better overall.       Expected Outcome Short: Continue to work on healthier options for meals and away from snacking.  Long: Continue to focus on heart healthy diet.              Nutrition Goals Discharge (Final Nutrition Goals Re-Evaluation):  Nutrition Goals Re-Evaluation - 05/12/20 1352      Goals   Nutrition Goal ST: when making pot roast, make extra tray of non starcy vegetbales husband may not like, cook 1 extra time during the week like omelette with vegetables, whole wheat toast, fruit LT: lower saturated fat, limit red meat and processed week, fruit and vegetable intake 8x/day    Comment Shekina is struggling with her depression currently.  She is eating more than normal.  She was able to try a new pot roast recipe and then made vegetable stew with leftovers. She is trying to do better overall.    Expected Outcome Short: Continue to work on healthier options for meals and away from snacking.  Long: Continue to focus on heart healthy diet.           Psychosocial: Target Goals: Acknowledge presence or absence of significant depression and/or stress, maximize coping skills, provide positive support system. Participant is able to verbalize types and ability to use techniques and skills needed for reducing stress and depression.   Education: Stress, Anxiety, and Depression - Group verbal and visual  presentation to define topics covered.  Reviews how body is impacted by stress, anxiety, and depression.  Also discusses healthy ways to reduce stress and to treat/manage anxiety and depression.  Written material given at graduation. Flowsheet Row Cardiac Rehab from 05/11/2020 in [facility] Cardiac and Pulmonary Rehab  Date 05/11/20  Educator [facility]  Instruction Review Code 1- [employer] Understanding      Education: Sleep Hygiene -Provides group verbal and written instruction about how sleep can affect your health.  Define sleep hygiene, discuss sleep cycles and impact of sleep habits. Review good sleep hygiene tips.    Initial Review & Psychosocial Screening:  Initial Psych Review & Screening - 03/21/20 1344      Initial Review   Current issues with Current Psychotropic Meds;Current Anxiety/Panic      Family Dynamics   Good Support System? Yes    Comments She sometimes gets anxiety with the heart issues she has gone through. She can look to her husband and her four puppies.  Barriers   Psychosocial barriers to participate in program The patient should benefit from training in stress management and relaxation.      Screening Interventions   Interventions Encouraged to exercise;To provide support and resources with identified psychosocial needs;Provide feedback about the scores to participant    Expected Outcomes Short Term goal: Utilizing psychosocial counselor, staff and physician to assist with identification of specific Stressors or current issues interfering with healing process. Setting desired goal for each stressor or current issue identified.;Long Term Goal: Stressors or current issues are controlled or eliminated.;Short Term goal: Identification and review with participant of any Quality of Life or Depression concerns found by scoring the questionnaire.;Long Term goal: The participant improves quality of Life and PHQ9 Scores as seen by post scores and/or verbalization of changes            Quality of Life Scores:   Quality of Life - 03/22/20 1513      Quality of Life   Select Quality of Life      Quality of Life Scores   Health/Function Pre 23.18 %    Socioeconomic Pre 28.75 %    Psych/Spiritual Pre 24.5 %    Family Pre 27.6 %    GLOBAL Pre 24.97 %          Scores of 19 and below usually indicate a poorer quality of life in these areas.  A difference of  2-3 points is a clinically meaningful difference.  A difference of 2-3 points in the total score of the Quality of Life Index has been associated with significant improvement in overall quality of life, self-image, physical symptoms, and general health in studies assessing change in quality of life.  PHQ-9: Recent Review Flowsheet Data    Depression screen [facility] 2/9 05/12/2020 04/18/2020 03/22/2020 03/14/2020 01/08/2019   Decreased Interest 1 1 1 2  0   Down, Depressed, Hopeless 1 1 1 1  0   PHQ - 2 Score 2 2 2 3  0   Altered sleeping 3 1 1 2  0   Tired, decreased energy 3 1 1 2  0   Change in appetite 2 2 1 2  0   Feeling bad or failure about yourself  1 1 1 3  0   Trouble concentrating 1 1 1 2  0   Moving slowly or fidgety/restless 0 0 0 2 0   Suicidal thoughts 0 0 0 0 0   PHQ-9 Score 12 8 7 16  0   Difficult doing work/chores Somewhat difficult Not difficult at all Not difficult at all - Not difficult at all     Interpretation of Total Score  Total Score Depression Severity:  1-4 = Minimal depression, 5-9 = Mild depression, 10-14 = Moderate depression, 15-19 = Moderately severe depression, 20-27 = Severe depression   Psychosocial Evaluation and Intervention:  Psychosocial Evaluation - 03/21/20 1346      Psychosocial Evaluation & Interventions   Interventions Encouraged to exercise with the program and follow exercise prescription;Stress management education;Relaxation education    Comments She sometimes gets anxiety with the heart issues she has gone through. She can look to her husband and her four puppies.     Expected Outcomes Short: Exercise regularly to support mental health and notify staff of any changes. Long: maintain mental health and well being through teaching of rehab or prescribed medications independently.    Continue Psychosocial Services  Follow up required by staff           Psychosocial Re-Evaluation:  Psychosocial Re-Evaluation  [place] Name 04/18/20 1408 05/12/20 1344           Psychosocial Re-Evaluation   Current issues with Current Sleep Concerns;History of Depression Current Sleep Concerns;Current Psychotropic Meds;Current Depression;Current Anxiety/Panic      Comments Makya is doing well mentally. She reports she has been having bad dreams at night which interupts her sleep.Though, she can go back to sleep pretty soon after she wakes up. Talked about doing some type of meditation or deep breathing before bed and she already prays before she goes to sleep which calms her mind down. She does have good suport from family. She is currently taking medication for depression and anxiety and she feels comfortable with where she is at. Denied wanting to see a therapist at this time. Her PHQ did go up 1 point, staff will continue to monitor. Leahann is in a little funk currently.  Her PHQ has crept up some over the past 3 weeks and she can feel the effects of it.  She has been sick.  Her husband has noticed too and is trying to pull her out too.  She is already on a high dose anti-depressant so she was encouraged to call her doctor's office to see if they can help her with a booster of meds or a change.  She is determined to make a change.  She has not been sleeping well recently and then wants to sleep all day in her chair. She has noted that her anxiety has gotten a little worse recently.  She did sit in on stress class yesterday.      Expected Outcomes Short: Talk to doctor aobut night terrors Long: Utilize exercise to help manage stress and maintain positive attitude Short: Talk to doctor  about depression symptoms and anxiety Long: Continue to manage depression positively.      Interventions Encouraged to attend Cardiac Rehabilitation for the exercise Encouraged to attend Cardiac Rehabilitation for the exercise;Stress management education      Continue Psychosocial Services  Follow up required by staff Follow up required by staff             Psychosocial Discharge (Final Psychosocial Re-Evaluation):  Psychosocial Re-Evaluation - 05/12/20 1344      Psychosocial Re-Evaluation   Current issues with Current Sleep Concerns;Current Psychotropic Meds;Current Depression;Current Anxiety/Panic    Comments Lauri is in a little funk currently.  Her PHQ has crept up some over the past 3 weeks and she can feel the effects of it.  She has been sick.  Her husband has noticed too and is trying to pull her out too.  She is already on a high dose anti-depressant so she was encouraged to call her doctor's office to see if they can help her with a booster of meds or a change.  She is determined to make a change.  She has not been sleeping well recently and then wants to sleep all day in her chair. She has noted that her anxiety has gotten a little worse recently.  She did sit in on stress class yesterday.    Expected Outcomes Short: Talk to doctor about depression symptoms and anxiety Long: Continue to manage depression positively.    Interventions Encouraged to attend Cardiac Rehabilitation for the exercise;Stress management education    Continue Psychosocial Services  Follow up required by staff           Vocational Rehabilitation: Provide vocational rehab assistance to qualifying candidates.   Vocational Rehab Evaluation & Intervention:  Education: Education Goals: Education classes will be provided on a variety of topics geared toward better understanding of heart health and risk factor modification. Participant will state understanding/return demonstration of topics presented as noted  by education test scores.  Learning Barriers/Preferences:  Learning Barriers/Preferences - 03/21/20 1342      Learning Barriers/Preferences   Learning Barriers None    Learning Preferences None           General Cardiac Education Topics:  AED/CPR: - Group verbal and written instruction with the use of models to demonstrate the basic use of the AED with the basic ABC's of resuscitation.   Anatomy and Cardiac Procedures: - Group verbal and visual presentation and models provide information about basic cardiac anatomy and function. Reviews the testing methods done to diagnose heart disease and the outcomes of the test results. Describes the treatment choices: Medical Management, Angioplasty, or Coronary Bypass Surgery for treating various heart conditions including Myocardial Infarction, Angina, Valve Disease, and Cardiac Arrhythmias.  Written material given at graduation. Flowsheet Row Cardiac Rehab from 05/11/2020 in [facility] Cardiac and Pulmonary Rehab  Date 03/30/20  Educator [facility]  Instruction Review Code 1- Verbalizes Understanding      Medication Safety: - Group verbal and visual instruction to review commonly prescribed medications for heart and lung disease. Reviews the medication, class of the drug, and side effects. Includes the steps to properly store meds and maintain the prescription regimen.  Written material given at graduation.   Intimacy: - Group verbal instruction through game format to discuss how heart and lung disease can affect sexual intimacy. Written material given at graduation..   Know Your Numbers and Heart Failure: - Group verbal and visual instruction to discuss disease risk factors for cardiac and pulmonary disease and treatment options.  Reviews associated critical values for Overweight/Obesity, Hypertension, Cholesterol, and Diabetes.  Discusses basics of heart failure: signs/symptoms and treatments.  Introduces Heart Failure Zone chart for action plan for  heart failure.  Written material given at graduation.   Infection Prevention: - Provides verbal and written material to individual with discussion of infection control including proper hand washing and proper equipment cleaning during exercise session. Flowsheet Row Cardiac Rehab from 05/11/2020 in [facility] Cardiac and Pulmonary Rehab  Date 03/22/20  Educator AS  Instruction Review Code 1- Verbalizes Understanding      Falls Prevention: - Provides verbal and written material to individual with discussion of falls prevention and safety. Flowsheet Row Cardiac Rehab from 05/11/2020 in [facility] Cardiac and Pulmonary Rehab  Date 03/22/20  Educator AS  Instruction Review Code 1- Verbalizes Understanding      Other: -Provides group and verbal instruction on various topics (see comments)   Knowledge Questionnaire Score:  Knowledge Questionnaire Score - 03/22/20 1514      Knowledge Questionnaire Score   Pre Score 22/26 angina, nutrition           Core Components/Risk Factors/Patient Goals at Admission:  Personal Goals and Risk Factors at Admission - 03/22/20 1517      Core Components/Risk Factors/Patient Goals on Admission    Weight Management Yes;Weight Loss    Intervention Weight Management: Develop a combined nutrition and exercise program designed to reach desired caloric intake, while maintaining appropriate intake of nutrient and fiber, sodium and fats, and appropriate energy expenditure required for the weight goal.;Weight Management: Provide education and appropriate resources to help participant work on and attain dietary goals.;Weight Management/Obesity: Establish reasonable short term and long term weight goals.;Obesity: Provide education and appropriate  resources to help participant work on and attain dietary goals.    Expected Outcomes Short Term: Continue to assess and modify interventions until short term weight is achieved;Long Term: Adherence to nutrition and physical  activity/exercise program aimed toward attainment of established weight goal;Weight Loss: Understanding of general recommendations for a balanced deficit meal plan, which promotes 1-2 lb weight loss per week and includes a negative energy balance of [phone] kcal/d;Understanding recommendations for meals to include 15-35% energy as protein, 25-35% energy from fat, 35-60% energy from carbohydrates, less than 212m of dietary cholesterol, 20-35 gm of total fiber daily;Understanding of distribution of calorie intake throughout the day with the consumption of 4-5 meals/snacks    Diabetes Yes    Intervention Provide education about signs/symptoms and action to take for hypo/hyperglycemia.;Provide education about proper nutrition, including hydration, and aerobic/resistive exercise prescription along with prescribed medications to achieve blood glucose in normal ranges: Fasting glucose 65-99 mg/dL    Expected Outcomes Short Term: Participant verbalizes understanding of the signs/symptoms and immediate care of hyper/hypoglycemia, proper foot care and importance of medication, aerobic/resistive exercise and nutrition plan for blood glucose control.;Long Term: Attainment of HbA1C < 7%.    Hypertension Yes    Intervention Provide education on lifestyle modifcations including regular physical activity/exercise, weight management, moderate sodium restriction and increased consumption of fresh fruit, vegetables, and low fat dairy, alcohol moderation, and smoking cessation.;Monitor prescription use compliance.    Expected Outcomes Short Term: Continued assessment and intervention until BP is < 140/910mHG in hypertensive participants. < 130/8062mG in hypertensive participants with diabetes, heart failure or chronic kidney disease.;Long Term: Maintenance of blood pressure at goal levels.    Lipids Yes    Intervention Provide education and support for participant on nutrition & aerobic/resistive exercise along with  prescribed medications to achieve LDL <72m72mDL >40mg45m Expected Outcomes Long Term: Cholesterol controlled with medications as prescribed, with individualized exercise RX and with personalized nutrition plan. Value goals: LDL < 72mg,73m > 40 mg.;Short Term: Participant states understanding of desired cholesterol values and is compliant with medications prescribed. Participant is following exercise prescription and nutrition guidelines.           Education:Diabetes - Individual verbal and written instruction to review signs/symptoms of diabetes, desired ranges of glucose level fasting, after meals and with exercise. Acknowledge that pre and post exercise glucose checks will be done for 3 sessions at entry of program. [place]  Date 03/21/20  [facility]- Verbalizes Understanding      Core Components/Risk Factors/Patient Goals Review:   Goals and Risk Factor Review    Row Name 04/18/20 1403 05/12/20 1405           Core Components/Risk Factors/Patient Goals Review   Personal Goals Review Weight Management/Obesity;Hypertension;Diabetes Weight Management/Obesity;Hypertension;Diabetes      Review Loraine Alysiaing well. She checks her sugar once daily, usually in the morning and it ranges around 130s. She is aware to contact her doctor if she starts getting abnormal numbers. She also checks her BP at home, reports systolic around 130s [id number] [id number] compliant with taking BP at home and checks it several times per week. Stays compliants with all her medications. At this time Kersten Pranathiot trying to lose weight but thinks she needs to. She is meeting with the  RD after class today and looking foward to learning more. She did  lose 30 lbs when she had COVID, but gained it right back. Doesn't weigh herself at home very often and encouraged her to maintain a log. Mella has gained 4 lbs in the  last week with her extra eating from depression.  We talked about the importance of calling her doctor.  She has not been checking her sugars as she has not felt like it and doesn't really care right now.  Her pressures have been doing well in rehab and she has not really worried about them as much.  She thinks that it is reading her high so we talked about bringing it into class to check against what we are hearing.      Expected Outcomes Short: Weigh herself at home more consistently Long: Continue to manage lifestyle risk factors Short: Call her doctor about her depression Long: Continue to monitor risk factors.             Core Components/Risk Factors/Patient Goals at Discharge (Final Review):   Goals and Risk Factor Review - 05/12/20 1405      Core Components/Risk Factors/Patient Goals Review   Personal Goals Review Weight Management/Obesity;Hypertension;Diabetes    Review Lenay has gained 4 lbs in the last week with her extra eating from depression.  We talked about the importance of calling her doctor.  She has not been checking her sugars as she has not felt like it and doesn't really care right now.  Her pressures have been doing well in rehab and she has not really worried about them as much.  She thinks that it is reading her high so we talked about bringing it into class to check against what we are hearing.    Expected Outcomes Short: Call her doctor about her depression Long: Continue to monitor risk factors.           ITP Comments:  ITP Comments    Row Name 03/21/20 1342 03/22/20 1526 03/28/20 1336 04/06/20 0520 05/04/20 0844   ITP Comments Virtual Visit completed. Patient informed on EP and RD appointment and 6 Minute walk test. Patient also informed of patient health questionnaires on My Chart. Patient Verbalizes understanding. Visit diagnosis can be found in [facility]12/10/2019. Completed 6MWT and gym orientation. Initial ITP created and sent for review to Dr. Emily Filbert, Medical  Director. First full day of exercise!  Patient was oriented to gym and equipment including functions, settings, policies, and procedures.  Patient's individual exercise prescription and treatment plan were reviewed.  All starting workloads were established based on the results of the 6 minute walk test done at initial orientation visit.  The plan for exercise progression was also introduced and progression will be customized based on patient's performance and goals. 30 Day review completed. Medical Director ITP review done, changes made as directed, and signed approval by Medical Director. 30 Day review completed. Medical Director ITP review done, changes made as directed, and signed approval by Medical Director.   Row Name 06/01/20 0606           ITP Comments 30 Day review completed. Medical Director ITP review done, changes made as directed, and signed approval by Medical Director.              Comments:

## 2020-06-02 ENCOUNTER — Other Ambulatory Visit: Payer: Self-pay

## 2020-06-02 ENCOUNTER — Other Ambulatory Visit: Payer: Self-pay | Admitting: General Surgery

## 2020-06-02 ENCOUNTER — Encounter: Payer: Medicare Other | Admitting: *Deleted

## 2020-06-02 DIAGNOSIS — I214 Non-ST elevation (NSTEMI) myocardial infarction: Secondary | ICD-10-CM

## 2020-06-02 DIAGNOSIS — Z1231 Encounter for screening mammogram for malignant neoplasm of breast: Secondary | ICD-10-CM

## 2020-06-02 NOTE — Progress Notes (Signed)
Daily Session Note  Patient Details  Name: JALEI SHIBLEY MRN: [id number] Date of Birth: 1955/09/01 Referring Provider:   Flowsheet Row Cardiac Rehab from 03/22/2020 in [facility] Cardiac and Pulmonary Rehab  Referring Provider Fath      Encounter Date: 06/02/2020  Check In:  Session Check In - 06/02/20 1332      Check-In   Supervising physician immediately available to respond to emergencies See telemetry face sheet for immediately available ER MD    Location [facility]-Cardiac & Pulmonary Rehab    Staff Present Renita Papa, RN BSN;Joseph [address] [place], [place], [place], CCRP, CCET    Virtual Visit No    Medication changes reported     No    Fall or balance concerns reported    No    Warm-up and Cool-down Performed on first and last piece of equipment    Resistance Training Performed Yes    VAD Patient? No    PAD/SET Patient? No      Pain Assessment   Currently in Pain? No/denies              Social History   Tobacco Use  Smoking Status Former Smoker  . Packs/day: 2.00  . Years: 15.00  . Pack years: 30.00  . Types: Cigarettes  . Quit date: 04/09/1997  . Years since quitting: 23.1  Smokeless Tobacco Never Used    Goals Met:  Independence with exercise equipment Exercise tolerated well No report of cardiac concerns or symptoms Strength training completed today  Goals Unmet:  Not Applicable  Comments: Pt able to follow exercise prescription today without complaint.  Will continue to monitor for progression.    Dr. Emily Filbert is Medical Director for [place] [facility].

## 2020-06-06 ENCOUNTER — Other Ambulatory Visit: Payer: Self-pay

## 2020-06-06 DIAGNOSIS — I214 Non-ST elevation (NSTEMI) myocardial infarction: Secondary | ICD-10-CM

## 2020-06-06 NOTE — Progress Notes (Signed)
Daily Session Note  Patient Details  Name: Amanda Wiley MRN: [id number] Date of Birth: 05-Mar-1956 Referring Provider:   Flowsheet Row Cardiac Rehab from 03/22/2020 in [facility] Cardiac and Pulmonary Rehab  Referring Provider Fath      Encounter Date: 06/06/2020  Check In:  Session Check In - 06/06/20 1335      Check-In   Supervising physician immediately available to respond to emergencies See telemetry face sheet for immediately available ER MD    Location [facility]-Cardiac & Pulmonary Rehab    Staff Present Birdie Sons, MPA, Mauricia Area, BS, ACSM CEP, Exercise Physiologist;Kara Eliezer Bottom, MS Exercise Physiologist    Virtual Visit No    Medication changes reported     No    Fall or balance concerns reported    No    Warm-up and Cool-down Performed on first and last piece of equipment    Resistance Training Performed Yes    VAD Patient? No    PAD/SET Patient? No      Pain Assessment   Currently in Pain? No/denies              Social History   Tobacco Use  Smoking Status Former Smoker  . Packs/day: 2.00  . Years: 15.00  . Pack years: 30.00  . Types: Cigarettes  . Quit date: 04/09/1997  . Years since quitting: 23.1  Smokeless Tobacco Never Used    Goals Met:  Independence with exercise equipment Exercise tolerated well No report of cardiac concerns or symptoms Strength training completed today  Goals Unmet:  Not Applicable  Comments: Pt able to follow exercise prescription today without complaint.  Will continue to monitor for progression.    Dr. Emily Filbert is Medical Director for [place] and [facility].

## 2020-06-08 ENCOUNTER — Encounter: Payer: Medicare Other | Attending: Cardiology

## 2020-06-08 ENCOUNTER — Other Ambulatory Visit: Payer: Self-pay

## 2020-06-08 DIAGNOSIS — I214 Non-ST elevation (NSTEMI) myocardial infarction: Secondary | ICD-10-CM | POA: Diagnosis not present

## 2020-06-08 NOTE — Progress Notes (Signed)
Daily Session Note  Patient Details  Name: BOBBI YOUNT MRN: [id number] Date of Birth: 03-24-1956 Referring Provider:   Flowsheet Row Cardiac Rehab from 03/22/2020 in [facility] Cardiac and Pulmonary Rehab  Referring Provider Fath      Encounter Date: 06/08/2020  Check In:  Session Check In - 06/08/20 1335      Check-In   Supervising physician immediately available to respond to emergencies See telemetry face sheet for immediately available ER MD    Location [facility]-Cardiac & Pulmonary Rehab    Staff Present Birdie Sons, MPA, RN;Joseph Lou Miner, [place] Exercise Physiologist    Virtual Visit No    Medication changes reported     No    Fall or balance concerns reported    No    Warm-up and Cool-down Performed on first and last piece of equipment    Resistance Training Performed Yes    VAD Patient? No    PAD/SET Patient? No      Pain Assessment   Currently in Pain? No/denies              Social History   Tobacco Use  Smoking Status Former Smoker  . Packs/day: 2.00  . Years: 15.00  . Pack years: 30.00  . Types: Cigarettes  . Quit date: 04/09/1997  . Years since quitting: 23.1  Smokeless Tobacco Never Used    Goals Met:  Independence with exercise equipment Exercise tolerated well Personal goals reviewed No report of cardiac concerns or symptoms Strength training completed today  Goals Unmet:  Not Applicable  Comments: Pt able to follow exercise prescription today without complaint.  Will continue to monitor for progression.    Dr. Emily Filbert is Medical Director for [place] and [facility].

## 2020-06-09 ENCOUNTER — Encounter: Payer: Medicare Other | Admitting: *Deleted

## 2020-06-09 ENCOUNTER — Other Ambulatory Visit: Payer: Self-pay

## 2020-06-09 DIAGNOSIS — I214 Non-ST elevation (NSTEMI) myocardial infarction: Secondary | ICD-10-CM

## 2020-06-09 NOTE — Progress Notes (Signed)
Daily Session Note  Patient Details  Name: SAMMY DOUTHITT MRN: [id number] Date of Birth: May 24, 1955 Referring Provider:   Flowsheet Row Cardiac Rehab from 03/22/2020 in [facility] Cardiac and Pulmonary Rehab  Referring Provider Fath      Encounter Date: 06/09/2020  Check In:  Session Check In - 06/09/20 1329      Check-In   Supervising physician immediately available to respond to emergencies See telemetry face sheet for immediately available ER MD    Location [facility]Cardiac & Pulmonary Rehab    Staff Present Renita Papa, RN BSN;Joseph [address] [place], [place], [place], CCRP, CCET    Virtual Visit No    Medication changes reported     No    Fall or balance concerns reported    No    Warm-up and Cool-down Performed on first and last piece of equipment    Resistance Training Performed Yes    VAD Patient? No    PAD/SET Patient? No      Pain Assessment   Currently in Pain? No/denies              Social History   Tobacco Use  Smoking Status Former Smoker  . Packs/day: 2.00  . Years: 15.00  . Pack years: 30.00  . Types: Cigarettes  . Quit date: 04/09/1997  . Years since quitting: 23.1  Smokeless Tobacco Never Used    Goals Met:  Independence with exercise equipment Exercise tolerated well No report of cardiac concerns or symptoms Strength training completed today  Goals Unmet:  Not Applicable  Comments: Pt able to follow exercise prescription today without complaint.  Will continue to monitor for progression.    Dr. Emily Filbert is Medical Director for [place] and [facility].

## 2020-06-13 ENCOUNTER — Other Ambulatory Visit: Payer: Self-pay

## 2020-06-13 DIAGNOSIS — I214 Non-ST elevation (NSTEMI) myocardial infarction: Secondary | ICD-10-CM | POA: Diagnosis not present

## 2020-06-13 NOTE — Progress Notes (Signed)
Daily Session Note  Patient Details  Name: Amanda Wiley MRN: [id number] Date of Birth: [birth year] Referring Provider:   Flowsheet Row Cardiac Rehab from 03/22/2020 in [facility] Cardiac and Pulmonary Rehab  Referring Provider Fath      Encounter Date: 06/13/2020  Check In:  Session Check In - 06/13/20 [place]      Check-In   Supervising physician immediately available to respond to emergencies See telemetry face sheet for immediately available ER MD    Location [facility] & Pul[facility] Rehab    Staff Present Birdie Sons, MPA, Mauricia Area, BS, ACSM CEP, Exercise Physiologist;Kara Eliezer Bottom, MS Exercise Physiologist    Virtual Visit No    Medication changes reported     No    Fall or balance concerns reported    No    Warm-up and Cool-down Performed on first and last piece of equipment    Resistance Training Performed Yes    VAD Patient? No    PAD/SET Patient? No      Pain Assessment   Currently in Pain? No/denies              Social History   Tobacco Use  Smoking Status Former Smoker  . Packs/day: 2.00  . Years: 15.00  . Pack years: 30.00  . Types: Cigarettes  . Quit date: 04/09/1997  . Years since quitting: 23.1  Smokeless Tobacco Never Used    Goals Met:  Independence with exercise equipment Exercise tolerated well No report of cardiac concerns or symptoms Strength training completed today  Goals Unmet:  Not Applicable  Comments: Pt able to follow exercise prescription today without complaint.  Will continue to monitor for progression.    Dr. Emily Filbert is Medical Director for [place] and [facility].

## 2020-06-15 ENCOUNTER — Other Ambulatory Visit: Payer: Self-pay

## 2020-06-15 DIAGNOSIS — I214 Non-ST elevation (NSTEMI) myocardial infarction: Secondary | ICD-10-CM

## 2020-06-15 NOTE — Progress Notes (Signed)
Daily Session Note  Patient Details  Name: Amanda Wiley MRN: [id number] Date of Birth: [birth year] Referring Provider:   Flowsheet Row Cardiac Rehab from 03/22/2020 in [facility] Cardiac and Pulmonary Rehab  Referring Provider Fath      Encounter Date: 06/15/2020  Check In:  Session Check In - 06/15/20 1331      Check-In   Supervising physician immediately available to respond to emergencies See telemetry face sheet for immediately available ER MD    Location [facility]-Card[facility] Pul[facility] Rehab    Staff Present Birdie Sons, MPA, Elveria Rising, BA, ACSM CEP, Exercise Physiologist;Kara Eliezer Bottom, MS Exercise Physiologist;Meredith Sherryll Burger, RN BSN    Virtual Visit No    Medication changes reported     No    Fall or balance concerns reported    No    Warm-up and Cool-down Performed on first and last piece of equipment    Resistance Training Performed Yes    VAD Patient? No    PAD/SET Patient? No      Pain Assessment   Currently in Pain? No/denies              Social History   Tobacco Use  Smoking Status Former Smoker  . Packs/day: 2.00  . Years: 15.00  . Pack years: 30.00  . Types: Cigarettes  . Quit date: 04/09/1997  . Years since quitting: 23.2  Smokeless Tobacco Never Used    Goals Met:  Independence with exercise equipment Exercise tolerated well No report of cardiac concerns or symptoms Strength training completed today  Goals Unmet:  Not Applicable  Comments: Pt able to follow exercise prescription today without complaint.  Will continue to monitor for progression.    Dr. Emily Filbert is Medical Director for [place] and [facility].

## 2020-06-22 ENCOUNTER — Other Ambulatory Visit: Payer: Self-pay

## 2020-06-22 DIAGNOSIS — I214 Non-ST elevation (NSTEMI) myocardial infarction: Secondary | ICD-10-CM

## 2020-06-22 NOTE — Progress Notes (Signed)
Daily Session Note  Patient Details  Name: Amanda Wiley MRN: [id number] Date of Birth: 25-Jul-1955 Referring Provider:   Flowsheet Row Cardiac Rehab from 03/22/2020 in [facility] Cardiac and Pulmonary Rehab  Referring Provider Fath      Encounter Date: 06/22/2020  Check In:  Session Check In - 06/22/20 1330      Check-In   Supervising physician immediately available to respond to emergencies See telemetry face sheet for immediately available ER MD    Location [facility]-Cardiac & Pulmonary Rehab    Staff Present Birdie Sons, MPA, RN;Joseph Lou Miner, [place] Exercise Physiologist    Virtual Visit No    Medication changes reported     No    Fall or balance concerns reported    No    Warm-up and Cool-down Performed on first and last piece of equipment    Resistance Training Performed Yes    VAD Patient? No    PAD/SET Patient? No      Pain Assessment   Currently in Pain? No/denies              Social History   Tobacco Use  Smoking Status Former Smoker   Packs/day: 2.00   Years: 15.00   Pack years: 30.00   Types: Cigarettes   Quit date: 04/09/1997   Years since quitting: 23.2  Smokeless Tobacco Never Used    Goals Met:  Independence with exercise equipment Exercise tolerated well No report of cardiac concerns or symptoms Strength training completed today  Goals Unmet:  Not Applicable  Comments: Pt able to follow exercise prescription today without complaint.  Will continue to monitor for progression.    Dr. Emily Filbert is Medical Director for [place] and [facility].

## 2020-06-23 ENCOUNTER — Encounter: Payer: Medicare Other | Admitting: *Deleted

## 2020-06-23 ENCOUNTER — Other Ambulatory Visit: Payer: Self-pay

## 2020-06-23 DIAGNOSIS — I214 Non-ST elevation (NSTEMI) myocardial infarction: Secondary | ICD-10-CM

## 2020-06-23 NOTE — Progress Notes (Signed)
Daily Session Note  Patient Details  Name: Amanda Wiley MRN: [id number] Date of Birth: 02/23/1956 Referring Provider:   Flowsheet Row Cardiac Rehab from 03/22/2020 in [facility] Cardiac and Pulmonary Rehab  Referring Provider Fath      Encounter Date: 06/23/2020  Check In:  Session Check In - 06/23/20 1326      Check-In   Supervising physician immediately available to respond to emergencies See telemetry face sheet for immediately available ER MD    Location [facility]Cardiac & Pulmonary Rehab    Staff Present Renita Papa, RN BSN;Joseph [address] [place], [place], RCEP, CCRP, CCET    Virtual Visit No    Medication changes reported     Yes    Comments increased trulicity and lantus    Fall or balance concerns reported    No    Warm-up and Cool-down Performed on first and last piece of equipment    Resistance Training Performed Yes    VAD Patient? No    PAD/SET Patient? No      Pain Assessment   Currently in Pain? No/denies              Social History   Tobacco Use  Smoking Status Former Smoker  . Packs/day: 2.00  . Years: 15.00  . Pack years: 30.00  . Types: Cigarettes  . Quit date: 04/09/1997  . Years since quitting: 23.2  Smokeless Tobacco Never Used    Goals Met:  Independence with exercise equipment Exercise tolerated well No report of cardiac concerns or symptoms Strength training completed today  Goals Unmet:  Not Applicable  Comments: Pt able to follow exercise prescription today without complaint.  Will continue to monitor for progression.    Dr. Emily Filbert is Medical Director for [place] and [facility].

## 2020-06-27 ENCOUNTER — Other Ambulatory Visit: Payer: Self-pay

## 2020-06-27 DIAGNOSIS — I214 Non-ST elevation (NSTEMI) myocardial infarction: Secondary | ICD-10-CM | POA: Diagnosis not present

## 2020-06-27 NOTE — Patient Instructions (Addendum)
Discharge Patient Instructions  Patient Details  Name: Amanda Wiley MRN: [id number] Date of Birth: January 25, 1956 Referring Provider:  Teodoro Spray, MD   Number of Visits: 50  Reason for Discharge:  Patient reached a stable level of exercise. Patient independent in their exercise. Patient has met program and personal goals.  Smoking History:  Social History   Tobacco Use  Smoking Status Former Smoker  . Packs/day: 2.00  . Years: 15.00  . Pack years: 30.00  . Types: Cigarettes  . Quit date: 04/09/1997  . Years since quitting: 23.2  Smokeless Tobacco Never Used    Diagnosis:  NSTEMI (non-ST elevated myocardial infarction) [facility])  Initial Exercise Prescription:  Initial Exercise Prescription - 03/22/20 1500      Date of Initial Exercise RX and Referring Provider   Date 03/22/20    Referring Provider Fath      Treadmill   MPH 2    Grade 1    Minutes 15    METs 2.8      Recumbant Bike   Level 1    RPM 60    Watts 38    Minutes 15    METs 3.2      Arm Ergometer   Level 1    RPM 25    Minutes 15    METs 3      REL-XR   Level 2    Speed 50    Minutes 15    METs 3.2      Prescription Details   Frequency (times per week) 3    Duration Progress to 30 minutes of continuous aerobic without signs/symptoms of physical distress      Intensity   THRR 40-80% of Max Heartrate 100-136    Ratings of Perceived Exertion 11-13    Perceived Dyspnea 0-4      Resistance Training   Training Prescription Yes    Weight 3 lb    Reps 10-15           Discharge Exercise Prescription (Final Exercise Prescription Changes):  Exercise Prescription Changes - 06/27/20 1500      Response to Exercise   Blood Pressure (Admit) 142/62    Blood Pressure (Exercise) 146/74    Blood Pressure (Exit) 130/60    Heart Rate (Admit) 68 bpm    Heart Rate (Exercise) 115 bpm    Heart Rate (Exit) 78 bpm    Rating of Perceived Exertion (Exercise) 12    Symptoms none    Duration  Continue with 30 min of aerobic exercise without signs/symptoms of physical distress.    Intensity THRR unchanged      Progression   Progression Continue to progress workloads to maintain intensity without signs/symptoms of physical distress.    Average METs 2.23      Resistance Training   Training Prescription Yes    Weight 3 lb    Reps 10-15      Interval Training   Interval Training No      Treadmill   MPH 2.1    Grade 1    Minutes 15    METs 2.9      Recumbant Elliptical   Level 2    Minutes 15    METs 2      T5 Nustep   Level 1    Minutes 15    METs 1.8      Home Exercise Plan   Plans to continue exercise at Home (comment)   YouTube videos  Frequency Add 2 additional days to program exercise sessions.   Start with 1 extra day   Initial Home Exercises Provided 04/18/20           Functional Capacity:  6 Minute Walk    Row Name 03/22/20 1503 06/15/20 1348       6 Minute Walk   Phase Initial Discharge    Distance 1170 feet 1360 feet    Distance % Change - 16.2 %    Distance Feet Change - 190 ft    Walk Time 6 minutes 6 minutes    # of Rest Breaks 0 0    MPH 2.2 2.6    METS 3.2 3.7    RPE 8 11    Perceived Dyspnea  1 1    VO2 Peak 11.27 12.94    Symptoms Yes (comment) No    Comments slightly SOB -    Resting HR 64 bpm 77 bpm    Resting BP 138/68 128/66    Resting Oxygen Saturation  95 % -    Exercise Oxygen Saturation  during 6 min walk 91 % -    Max Ex. HR 113 bpm 123 bpm    Max Ex. BP 208/62 206/62    2 Minute Post BP 124/68 176/68           Nutrition & Weight - Outcomes:  Pre Biometrics - 03/22/20 1511      Pre Biometrics   Height 5' 5.5" (1.664 m)    Weight 219 lb 4.8 oz (99.5 kg)    BMI (Calculated) 35.93    Single Leg Stand 5 seconds            Nutrition:  Nutrition Therapy & Goals - 04/18/20 1435      Nutrition Therapy   Diet Heart healthy, low Na    Protein (specify units) 80g    Fiber 25 grams    Whole Grain Foods 3  servings    Saturated Fats 12 max. grams    Fruits and Vegetables 8 servings/day    Sodium 1.5 grams      Personal Nutrition Goals   Nutrition Goal ST: when making pot roast, make extra tray of non starcy vegetbales husband Nataliah Hatlestad not like, cook 1 extra time during the week like omelette with vegetables, whole wheat toast, fruit LT: lower saturated fat, limit red meat and processed week, fruit and vegetable intake 8x/day    Comments She cut back on using salt after cooking (still cooks with salt), cut back on fried foods). She will cook to cater to her husbands needs (he has an ostomy); she loves brussells sprouts, greens, corn, cabbage, homemade vegetable soup. She will make vegetable soup and freeze it. Sleep in until 930, 10am - coffee (sweet n' low,non-dairy creamer). Drinks: diet caffiene free coke. L: bologna sandwich, leftovers (chinese, broccoli cheddar soup for example D: go out most often: taco salads, quarter pounder (someitmes), cheeseburger. Soups (homemade potato soup 2x/week), tomato soup and grilled cheese. Sundays she will make a roast, vegetables, cornbread with gravy, mini raviolis, salads (chef) - she normally cooks those days. S: popcorn (white cheddar) - sometimesonly simply salted, nabs, little debbie cakes; she enjoys salty over sweet. She eats when shes not hungry, something looks good or she is craving something. She sometimes feels overwhelmed with the chnages she has to make, discussed small manageable changes. Discussed heart healhty eating.      Intervention Plan   Intervention Prescribe, educate and counsel regarding individualized  specific dietary modifications aiming towards targeted core components such as weight, hypertension, lipid management, diabetes, heart failure and other comorbidities.;Nutrition handout(s) given to patient.    Expected Outcomes Short Term Goal: Understand basic principles of dietary content, such as calories, fat, sodium, cholesterol and  nutrients.;Short Term Goal: A plan has been developed with personal nutrition goals set during dietitian appointment.;Long Term Goal: Adherence to prescribed nutrition plan.            Goals reviewed with patient; copy given to patient.

## 2020-06-27 NOTE — Progress Notes (Signed)
Daily Session Note  Patient Details  Name: ANNTONETTE MADEWELL MRN: [id number] Date of Birth: 08/12/1955 Referring Provider:   Flowsheet Row Cardiac Rehab from 03/22/2020 in [facility] Cardiac and Pulmonary Rehab  Referring Provider Fath      Encounter Date: 06/27/2020  Check In:  Session Check In - 06/27/20 1333      Check-In   Supervising physician immediately available to respond to emergencies See telemetry face sheet for immediately available ER MD    Location [facility]-Cardiac & Pulmonary Rehab    Staff Present Birdie Sons, MPA, Mauricia Area, BS, ACSM CEP, Exercise Physiologist;Kara Eliezer Bottom, MS Exercise Physiologist    Virtual Visit No    Medication changes reported     No    Fall or balance concerns reported    No    Warm-up and Cool-down Performed on first and last piece of equipment    Resistance Training Performed Yes    VAD Patient? No    PAD/SET Patient? No      Pain Assessment   Currently in Pain? No/denies              Social History   Tobacco Use  Smoking Status Former Smoker  . Packs/day: 2.00  . Years: 15.00  . Pack years: 30.00  . Types: Cigarettes  . Quit date: 04/09/1997  . Years since quitting: 23.2  Smokeless Tobacco Never Used    Goals Met:  Independence with exercise equipment Exercise tolerated well No report of cardiac concerns or symptoms Strength training completed today  Goals Unmet:  Not Applicable  Comments: Pt able to follow exercise prescription today without complaint.  Will continue to monitor for progression.    Dr. Emily Filbert is Medical Director for [place] and [facility].

## 2020-06-29 ENCOUNTER — Encounter: Payer: Self-pay | Admitting: *Deleted

## 2020-06-29 ENCOUNTER — Other Ambulatory Visit: Payer: Self-pay

## 2020-06-29 DIAGNOSIS — I214 Non-ST elevation (NSTEMI) myocardial infarction: Secondary | ICD-10-CM

## 2020-06-29 NOTE — Progress Notes (Signed)
Cardiac Individual Treatment Plan  Patient Details  Name: Amanda Wiley MRN: [id number] Date of Birth: 19-Jul-1955 Referring Provider:   Flowsheet Row Cardiac Rehab from 03/22/2020 in [facility] Cardiac and Pulmonary Rehab  Referring Provider Fath      Initial Encounter Date:  Flowsheet Row Cardiac Rehab from 03/22/2020 in [facility] Cardiac and Pulmonary Rehab  Date 03/22/20      Visit Diagnosis: NSTEMI (non-ST elevated myocardial infarction) [facility])  Patient's Home Medications on Admission:  Current Outpatient Medications:  .  acetaminophen (TYLENOL) 325 MG tablet, Take 2 tablets (650 mg total) by mouth every 6 (six) hours as needed for mild pain (or Fever >/= 101)., Disp:  , Rfl:  .  albuterol (PROVENTIL) (2.5 MG/3ML) 0.083% nebulizer solution, Take 2.5 mg by nebulization every 6 (six) hours as needed for wheezing., Disp: , Rfl:  .  amLODipine (NORVASC) 5 MG tablet, Take 1 tablet (5 mg total) by mouth daily., Disp: 30 tablet, Rfl: 0 .  amLODipine (NORVASC) 5 MG tablet, Take 1 tablet by mouth daily., Disp: , Rfl:  .  aspirin 81 MG chewable tablet, Chew 1 tablet (81 mg total) by mouth daily., Disp: 30 tablet, Rfl: 0 .  aspirin 81 MG chewable tablet, Chew 1 tablet by mouth daily., Disp: , Rfl:  .  buPROPion (WELLBUTRIN XL) 300 MG 24 hr tablet, Take 1 tablet (300 mg total) by mouth daily., Disp: 90 tablet, Rfl: 1 .  Dulaglutide (TRULICITY) 1.5 RF/1.6BW SOPN, Inject 1.5 mg into the skin once a week. ON SUNDAYS, Disp: , Rfl:  .  fluticasone-salmeterol (ADVAIR HFA) 230-21 MCG/ACT inhaler, Inhale 2 puffs into the lungs 2 (two) times daily., Disp: , Rfl:  .  hyoscyamine (LEVSIN SL) 0.125 MG SL tablet, Place 0.125 mg under the tongue as needed., Disp: , Rfl:  .  insulin glargine (LANTUS) 100 UNIT/ML injection, Inject 20 Units into the skin at bedtime. , Disp: , Rfl:  .  ipratropium (ATROVENT) 0.06 % nasal spray, Place 2 sprays into both nostrils 3 (three) times daily as needed for rhinitis.  (Patient not  taking: Reported on 03/21/2020), Disp: , Rfl:  .  Ipratropium-Albuterol (COMBIVENT) 20-100 MCG/ACT AERS respimat, Inhale 2 puffs into the lungs every 4 (four) hours while awake. (Patient taking differently: Inhale 1 puff into the lungs 2 (two) times daily. And as needed as a rescue inhaler.), Disp: 4 g, Rfl: 2 .  isosorbide mononitrate (IMDUR) 30 MG 24 hr tablet, Take 1 tablet (30 mg total) by mouth daily., Disp: 30 tablet, Rfl: 0 .  JARDIANCE 25 MG TABS tablet, Take 25 mg by mouth daily., Disp: , Rfl:  .  losartan (COZAAR) 25 MG tablet, Take 1 tablet (25 mg total) by mouth daily., Disp: 30 tablet, Rfl: 0 .  metoprolol tartrate (LOPRESSOR) 25 MG tablet, Take 0.5 tablets (12.5 mg total) by mouth 2 (two) times daily., Disp: 30 tablet, Rfl: 0 .  nitroGLYCERIN (NITROSTAT) 0.4 MG SL tablet, Place 1 tablet (0.4 mg total) under the tongue every 5 (five) minutes as needed for chest pain., Disp: 30 tablet, Rfl: 0 .  prasugrel (EFFIENT) 10 MG TABS tablet, Take 1 tablet (10 mg total) by mouth daily., Disp: 30 tablet, Rfl: 0 .  RABEprazole (ACIPHEX) 20 MG tablet, Take 1 tablet (20 mg total) by mouth in the morning and at bedtime., Disp: 180 tablet, Rfl: 1 .  rosuvastatin (CRESTOR) 5 MG tablet, Take 1 tablet (5 mg total) by mouth daily., Disp: 30 tablet, Rfl: 0 .  ULTRACARE  PEN NEEDLES 32G X 4 MM MISC, , Disp: , Rfl:  .  vitamin C (VITAMIN C) 1000 MG tablet, Take 1 tablet (1,000 mg total) by mouth daily., Disp:  , Rfl:   Past Medical History: Past Medical History:  Diagnosis Date  . Arthritis   . Asthma   . Complication of anesthesia    HARD TO WAKE UP  . COPD (chronic obstructive pulmonary disease) ([place])   . Crohn's disease ([place])   . Depression   . Diabetes mellitus ([place])   . Fracture, foot    post fall  . Hypercholesterolemia   . Hypertension   . IBS (irritable bowel syndrome)   . Nephrolithiasis    s/p lithotripsy [facility])  . Vitamin D deficiency     Tobacco Use: Social  History   Tobacco Use  Smoking Status Former Smoker  . Packs/day: 2.00  . Years: 15.00  . Pack years: 30.00  . Types: Cigarettes  . Quit date: 04/09/1997  . Years since quitting: 23.2  Smokeless Tobacco Never Used    Labs: Recent Review Flowsheet Data    Labs for ITP Cardiac and Pulmonary Rehab Latest Ref Rng & Units 01/13/2019 02/02/2019 06/02/2019 10/14/2019 02/12/2020   Cholestrol 0 - 200 mg/dL - - 208(H) 210(H) 222(H)   LDLCALC 0 - 99 mg/dL - - - - -   LDLDIRECT mg/dL - - 120.0 128.0 149.0   HDL >39.00 mg/dL - - 39.20 41.20 40.50   Trlycerides 0.0 - 149.0 mg/dL - - 396.0(H) 324.0(H) 330.0(H)   Hemoglobin A1c 4.6 - 6.5 % 7.3(H) - 7.0(H) 7.6(H) 7.7(H)   PHART 7.350 - 7.450 - 7.50(H) - - -   PCO2ART 32.0 - 48.0 mmHg - 39 - - -   HCO3 20.0 - 28.0 mmol/L - 30.4(H) - - -   O2SAT % - 92.8 - - -       Exercise Target Goals: Exercise Program Goal: Individual exercise prescription set using results from initial 6 min walk test and THRR while considering  patient's activity barriers and safety.   Exercise Prescription Goal: Initial exercise prescription builds to 30-45 minutes a day of aerobic activity, 2-3 days per week.  Home exercise guidelines will be given to patient during program as part of exercise prescription that the participant will acknowledge.   Education: Aerobic Exercise: - Group verbal and visual presentation on the components of exercise prescription. Introduces F.I.T.T principle from ACSM for exercise prescriptions.  Reviews F.I.T.T. principles of aerobic exercise including progression. Written material given at graduation.   Education: Resistance Exercise: - Group verbal and visual presentation on the components of exercise prescription. Introduces F.I.T.T principle from ACSM for exercise prescriptions  Reviews F.I.T.T. principles of resistance exercise including progression. Written material given at graduation. Flowsheet Row Cardiac Rehab from 05/11/2020 in [facility]  Cardiac and Pulmonary Rehab  Date 03/30/20  Educator AS  Instruction Review Code 1- Verbalizes Understanding       Education: Exercise & Equipment Safety: - Individual verbal instruction and demonstration of equipment use and safety with use of the equipment. Flowsheet Row Cardiac Rehab from 05/11/2020 in [facility] Cardiac and Pulmonary Rehab  Date 03/22/20  Educator AS  Instruction Review Code 1- Verbalizes Understanding      Education: Exercise Physiology & General Exercise Guidelines: - Group verbal and written instruction with models to review the exercise physiology of the cardiovascular system and associated critical values. Provides general exercise guidelines with specific guidelines to those with heart or lung disease.  Education: Flexibility, Balance, Mind/Body Relaxation: - Group verbal and visual presentation with interactive activity on the components of exercise prescription. Introduces F.I.T.T principle from ACSM for exercise prescriptions. Reviews F.I.T.T. principles of flexibility and balance exercise training including progression. Also discusses the mind body connection.  Reviews various relaxation techniques to help reduce and manage stress (i.e. Deep breathing, progressive muscle relaxation, and visualization). Balance handout provided to take home. Written material given at graduation.   Activity Barriers & Risk Stratification:   6 Minute Walk:  6 Minute Walk    Row Name 03/22/20 1503 06/15/20 1348       6 Minute Walk   Phase Initial Discharge    Distance 1170 feet 1360 feet    Distance % Change -- 16.2 %    Distance Feet Change -- 190 ft    Walk Time 6 minutes 6 minutes    # of Rest Breaks 0 0    MPH 2.2 2.6    METS 3.2 3.7    RPE 8 11    Perceived Dyspnea  1 1    VO2 Peak 11.27 12.94    Symptoms Yes (comment) No    Comments slightly SOB --    Resting HR 64 bpm 77 bpm    Resting BP 138/68 128/66    Resting Oxygen Saturation  95 % --    Exercise  Oxygen Saturation  during 6 min walk 91 % --    Max Ex. HR 113 bpm 123 bpm    Max Ex. BP 208/62 206/62    2 Minute Post BP 124/68 176/68           Oxygen Initial Assessment:   Oxygen Re-Evaluation:   Oxygen Discharge (Final Oxygen Re-Evaluation):   Initial Exercise Prescription:  Initial Exercise Prescription - 03/22/20 1500      Date of Initial Exercise RX and Referring Provider   Date 03/22/20    Referring Provider Fath      Treadmill   MPH 2    Grade 1    Minutes 15    METs 2.8      Recumbant Bike   Level 1    RPM 60    Watts 38    Minutes 15    METs 3.2      Arm Ergometer   Level 1    RPM 25    Minutes 15    METs 3      REL-XR   Level 2    Speed 50    Minutes 15    METs 3.2      Prescription Details   Frequency (times per week) 3    Duration Progress to 30 minutes of continuous aerobic without signs/symptoms of physical distress      Intensity   THRR 40-80% of Max Heartrate 100-136    Ratings of Perceived Exertion 11-13    Perceived Dyspnea 0-4      Resistance Training   Training Prescription Yes    Weight 3 lb    Reps 10-15           Perform Capillary Blood Glucose checks as needed.  Exercise Prescription Changes:  Exercise Prescription Changes    Row Name 03/22/20 1500 04/04/20 1600 04/18/20 1000 04/18/20 1500 05/04/20 1300     Response to Exercise   Blood Pressure (Admit) 138/68 122/68 140/80 -- 126/64   Blood Pressure (Exercise) 208/62 174/78 180/76 -- 158/80   Blood Pressure (Exit) 124/68 120/66 134/60 -- 132/80   Heart  Rate (Admit) 64 bpm 103 bpm 95 bpm -- 86 bpm   Heart Rate (Exercise) 113 bpm 117 bpm 119 bpm -- 113 bpm   Heart Rate (Exit) 84 bpm 81 bpm 86 bpm -- 88 bpm   Oxygen Saturation (Admit) 95 % -- -- -- --   Oxygen Saturation (Exercise) 91 % -- -- -- --   Oxygen Saturation (Exit) 96 % -- -- -- --   Rating of Perceived Exertion (Exercise) _0 -- 11   Perceived Dyspnea (Exercise) 1 -- -- -- --   Symptoms SOB  none none -- none   Comments -- fourth session -- -- --   Duration -- Continue with 30 min of aerobic exercise without signs/symptoms of physical distress. Continue with 30 min of aerobic exercise without signs/symptoms of physical distress. -- Continue with 30 min of aerobic exercise without signs/symptoms of physical distress.   Intensity -- THRR unchanged THRR unchanged -- THRR unchanged     Progression   Progression -- Continue to progress workloads to maintain intensity without signs/symptoms of physical distress. Continue to progress workloads to maintain intensity without signs/symptoms of physical distress. -- Continue to progress workloads to maintain intensity without signs/symptoms of physical distress.   Average METs -- 2.45 2.11 -- 2.3     Resistance Training   Training Prescription -- Yes Yes -- Yes   Weight -- 3 lb 3 lb -- 3 lb   Reps -- 10-15 10-15 -- 10-15     Interval Training   Interval Training -- -- -- -- No     Treadmill   MPH -- 1.9 2 -- 2.1   Grade -- 1 1 -- 1   Minutes -- 15 15 -- 15   METs -- 2.63 2.72 -- 2.81     Recumbant Elliptical   Level -- -- -- -- 1.7   Minutes -- -- -- -- 15   METs -- -- -- -- 1.8     REL-XR   Level -- 2 1 -- 3   Speed -- 50 50 -- --   Minutes -- 15 15 -- 15   METs -- 2.1 1.5 -- 2.3     Home Exercise Plan   Plans to continue exercise at -- -- -- Home (comment)  YouTube videos Home (comment)  YouTube videos   Frequency -- -- -- Add 2 additional days to program exercise sessions.  Start with 1 extra day Add 2 additional days to program exercise sessions.  Start with 1 extra day   Initial Home Exercises Provided -- -- -- 04/18/20 04/18/20   Row Name 05/16/20 1600 05/31/20 1600 06/14/20 0800 06/27/20 1500       Response to Exercise   Blood Pressure (Admit) 126/74 122/72 130/64 142/62    Blood Pressure (Exercise) 146/74 144/70 196/68 146/74    Blood Pressure (Exit) 124/60 112/68 132/82 130/60    Heart Rate (Admit) 81 bpm 70  bpm 82 bpm 68 bpm    Heart Rate (Exercise) 126 bpm 106 bpm 115 bpm 115 bpm    Heart Rate (Exit) 94 bpm 86 bpm 87 bpm 78 bpm    Rating of Perceived Exertion (Exercise) _1 Symptoms none none none none    Duration Continue with 30 min of aerobic exercise without signs/symptoms of physical distress. Continue with 30 min of aerobic exercise without signs/symptoms of physical distress. Continue with 30 min of aerobic exercise without signs/symptoms of physical distress. Continue with 30  min of aerobic exercise without signs/symptoms of physical distress.    Intensity THRR unchanged THRR unchanged THRR unchanged THRR unchanged         Progression   Progression Continue to progress workloads to maintain intensity without signs/symptoms of physical distress. Continue to progress workloads to maintain intensity without signs/symptoms of physical distress. Continue to progress workloads to maintain intensity without signs/symptoms of physical distress. Continue to progress workloads to maintain intensity without signs/symptoms of physical distress.    Average METs 2.8 2.66 2.45 2.23         Resistance Training   Training Prescription Yes Yes Yes Yes    Weight 3 lb 3 lb 3 lb 3 lb    Reps 10-15 10-15 10-15 10-15         Interval Training   Interval Training -- No No No         Treadmill   MPH 2 2 2.1 2.1    Grade _0 Minutes _1 METs 2.8 2.99 2.99 2.9         Recumbant Elliptical   Level -- 1 1.5 2    Minutes -- _2 METs -- _3 REL-XR   Level 5 5 -- --    Speed 50 -- -- --    Minutes 15 15 -- --    METs 2.8 3 -- --         T5 Nustep   Level -- -- -- 1    Minutes -- -- -- 15    METs -- -- -- 1.8         Home Exercise Plan   Plans to continue exercise at -- Home (comment)  YouTube videos -- Home (comment)  YouTube videos    Frequency -- Add 2 additional days to program exercise sessions.  Start with 1 extra day -- Add 2 additional  days to program exercise sessions.  Start with 1 extra day    Initial Home Exercises Provided -- 04/18/20 -- 04/18/20           Exercise Comments:  Exercise Comments    Row Name 03/28/20 1336           Exercise Comments First full day of exercise!  Patient was oriented to gym and equipment including functions, settings, policies, and procedures.  Patient's individual exercise prescription and treatment plan were reviewed.  All starting workloads were established based on the results of the 6 minute walk test done at initial orientation visit.  The plan for exercise progression was also introduced and progression will be customized based on patient's performance and goals.              Exercise Goals and Review:  Exercise Goals    Row Name 03/22/20 1510             Exercise Goals   Increase Physical Activity Yes       Intervention Provide advice, education, support and counseling about physical activity/exercise needs.;Develop an individualized exercise prescription for aerobic and resistive training based on initial evaluation findings, risk stratification, comorbidities and participant's personal goals.       Expected Outcomes Short Term: Attend rehab on a regular basis to increase amount of physical activity.;Long Term: Add in home exercise to make exercise part of routine and to increase amount of physical activity.;Long Term: Exercising regularly  at least 3-5 days a week.       Increase Strength and Stamina Yes       Intervention Provide advice, education, support and counseling about physical activity/exercise needs.;Develop an individualized exercise prescription for aerobic and resistive training based on initial evaluation findings, risk stratification, comorbidities and participant's personal goals.       Expected Outcomes Short Term: Increase workloads from initial exercise prescription for resistance, speed, and METs.;Short Term: Perform resistance training exercises  routinely during rehab and add in resistance training at home;Long Term: Improve cardiorespiratory fitness, muscular endurance and strength as measured by increased METs and functional capacity (6MWT)       Able to understand and use rate of perceived exertion (RPE) scale Yes       Intervention Provide education and explanation on how to use RPE scale       Expected Outcomes Short Term: Able to use RPE daily in rehab to express subjective intensity level;Long Term:  Able to use RPE to guide intensity level when exercising independently       Able to understand and use Dyspnea scale Yes       Intervention Provide education and explanation on how to use Dyspnea scale       Expected Outcomes Short Term: Able to use Dyspnea scale daily in rehab to express subjective sense of shortness of breath during exertion;Long Term: Able to use Dyspnea scale to guide intensity level when exercising independently       Knowledge and understanding of Target Heart Rate Range (THRR) Yes       Intervention Provide education and explanation of THRR including how the numbers were predicted and where they are located for reference       Expected Outcomes Short Term: Able to state/look up THRR;Short Term: Able to use daily as guideline for intensity in rehab;Long Term: Able to use THRR to govern intensity when exercising independently       Able to check pulse independently Yes       Intervention Provide education and demonstration on how to check pulse in carotid and radial arteries.;Review the importance of being able to check your own pulse for safety during independent exercise       Expected Outcomes Short Term: Able to explain why pulse checking is important during independent exercise;Long Term: Able to check pulse independently and accurately       Understanding of Exercise Prescription Yes       Intervention Provide education, explanation, and written materials on patient's individual exercise prescription        Expected Outcomes Short Term: Able to explain program exercise prescription;Long Term: Able to explain home exercise prescription to exercise independently              Exercise Goals Re-Evaluation :  Exercise Goals Re-Evaluation    Row Name 03/28/20 1336 04/04/20 1652 04/18/20 1014 04/18/20 1342 04/18/20 1424     Exercise Goal Re-Evaluation   Exercise Goals Review Increase Physical Activity;Able to understand and use rate of perceived exertion (RPE) scale;Knowledge and understanding of Target Heart Rate Range (THRR);Understanding of Exercise Prescription;Increase Strength and Stamina;Able to understand and use Dyspnea scale;Able to check pulse independently Increase Physical Activity;Increase Strength and Stamina Increase Physical Activity;Increase Strength and Stamina Increase Physical Activity;Increase Strength and Stamina Increase Physical Activity;Increase Strength and Stamina;Understanding of Exercise Prescription   Comments Reviewed RPE and dyspnea scales, THR and program prescription with pt today.  Pt voiced understanding and was given a copy of  goals to take home. Lindalee has tolerated exercise well in her first sessions.  She has gradually increase TM from 1.21mh/0% to 1.9 mph and 1 %.  She works in [employer].  We will monitor progress. LSimonacontinues to tolerate exercise well.  Staff will monitor progress. -- Reviewed home exercise with pt today.  Pt plans to complete home Youtube videos for exercise.  Reviewed THR, pulse, RPE, sign and symptoms, pulse oximetery and when to call 911 or MD.  Also discussed weather considerations and indoor options.  Pt voiced understanding. Advised patient to gradually increase her home exercise to +1 day/week, and slowly work her duration up.   Expected Outcomes Short: Use RPE daily to regulate intensity. Long: Follow program prescription in THR. Short: attend consistently Long: improve overall stamina Short: attned consistently Long: increase stamina --  Short: Add in 1 day of exercise at home Long: Ability to exercise at home independently without any complications   Row Name 05/04/20 1352 05/12/20 1339 05/16/20 1652 05/31/20 1611 06/08/20 1342     Exercise Goal Re-Evaluation   Exercise Goals Review Increase Physical Activity;Increase Strength and Stamina;Understanding of Exercise Prescription Increase Physical Activity;Increase Strength and Stamina;Understanding of Exercise Prescription Increase Physical Activity;Increase Strength and Stamina Increase Physical Activity;Increase Strength and Stamina;Understanding of Exercise Prescription Increase Physical Activity;Increase Strength and Stamina;Understanding of Exercise Prescription   Comments LBreniyahis doing well in rehab.  She is up to level 1.7 on the recumbent elliptical. We will continue to monitor her progress. LVadisis doing well in rehab.  She is not doing much exercise at home as she is in a little funk currently.  We talked about using exercise to help with depression.  She is going to try again and talk with her doctor. LMikahis progressing well and has improved her MET level by increasing load on XR.  Staff willencourage her to increase weights for strength work.  She works in correct THR range. LRettais doing well in rehab.  She is up to 2 METs on the recumbent elliptical.  We will continue to encourage her to try 4 lb weights and monitor her progress. LClaudeenlacks motivation to do exercise at home so she hasn't done too much. She blames her mood a lot. She is already on a high dose of anti-depressant and she does plans to talk to doctor aout different options to pursue. We talked about setting a schedule to include her exercise time in. Start with 10-15 minutes and eventually progress to 30 monutes. She has an appt in the next couple of weeks with her doctor. She plans to to walk after [place] will be due for her 6MWT next week in which we anticipate an  improvement.   Expected Outcomes Short: Continue to move up workloads slowly  Long: Continue to build stamina Short: Start walking again at home Long; Continue to get back to routine. Short:  continue to exercise consistently Long:  increase overall stamina Short: Increase to 4 lb weights Long: Continue to improve stamina. Short: Improve on 6MWT Long: Exercise independently at home and progress up to the 30 minutes minimum   Row Name 06/14/20 0846 06/27/20 1546           Exercise Goal Re-Evaluation   Exercise Goals Review Increase Physical Activity;Increase Strength and Stamina Increase Physical Activity;Increase Strength and Stamina;Understanding of Exercise Prescription      Comments LDenny Peonhas increased speed on TM.  Staff will encourage increasing  weights to 4 lb Felesha will be graduating this week!  She is planning to continue to walk and use videos at home for exercise.      Expected Outcomes Short: complete HT program Long: maintain exercise on her own Graduate and continue to exercise independently             Discharge Exercise Prescription (Final Exercise Prescription Changes):  Exercise Prescription Changes - 06/27/20 1500      Response to Exercise   Blood Pressure (Admit) 142/62    Blood Pressure (Exercise) 146/74    Blood Pressure (Exit) 130/60    Heart Rate (Admit) 68 bpm    Heart Rate (Exercise) 115 bpm    Heart Rate (Exit) 78 bpm    Rating of Perceived Exertion (Exercise) 12    Symptoms none    Duration Continue with 30 min of aerobic exercise without signs/symptoms of physical distress.    Intensity THRR unchanged      Progression   Progression Continue to progress workloads to maintain intensity without signs/symptoms of physical distress.    Average METs 2.23      Resistance Training   Training Prescription Yes    Weight 3 lb    Reps 10-15      Interval Training   Interval Training No      Treadmill   MPH 2.1    Grade 1    Minutes 15    METs 2.9       Recumbant Elliptical   Level 2    Minutes 15    METs 2      T5 Nustep   Level 1    Minutes 15    METs 1.8      Home Exercise Plan   Plans to continue exercise at Home (comment)   YouTube videos   Frequency Add 2 additional days to program exercise sessions.   Start with 1 extra day   Initial Home Exercises Provided 04/18/20           Nutrition:  Target Goals: Understanding of nutrition guidelines, daily intake of sodium <1536m, cholesterol <2073m calories 30% from fat and 7% or less from saturated fats, daily to have 5 or more servings of fruits and vegetables.  Education: All About Nutrition: -Group instruction provided by verbal, written material, interactive activities, discussions, models, and posters to present general guidelines for heart healthy nutrition including fat, fiber, MyPlate, the role of sodium in heart healthy nutrition, utilization of the nutrition label, and utilization of this knowledge for meal planning. Follow up email sent as well. Written material given at graduation.   Biometrics:  Pre Biometrics - 03/22/20 1511      Pre Biometrics   Height 5' 5.5" (1.664 m)    Weight 219 lb 4.8 oz (99.5 kg)    BMI (Calculated) 35.93    Single Leg Stand 5 seconds            Nutrition Therapy Plan and Nutrition Goals:  Nutrition Therapy & Goals - 04/18/20 1435      Nutrition Therapy   Diet Heart healthy, low Na    Protein (specify units) 80g    Fiber 25 grams    Whole Grain Foods 3 servings    Saturated Fats 12 max. grams    Fruits and Vegetables 8 servings/day    Sodium 1.5 grams      Personal Nutrition Goals   Nutrition Goal ST: when making pot roast, make extra tray of non starcy vegetbales  husband may not like, cook 1 extra time during the week like omelette with vegetables, whole wheat toast, fruit LT: lower saturated fat, limit red meat and processed week, fruit and vegetable intake 8x/day    Comments She cut back on using salt after cooking  (still cooks with salt), cut back on fried foods). She will cook to cater to her husbands needs (he has an ostomy); she loves brussells sprouts, greens, corn, cabbage, homemade vegetable soup. She will make vegetable soup and freeze it. Sleep in until 930, 10am - coffee (sweet n' low,non-dairy creamer). Drinks: diet caffiene free coke. L: bologna sandwich, leftovers (chinese, broccoli cheddar soup for example D: go out most often: taco salads, quarter pounder (someitmes), cheeseburger. Soups (homemade potato soup 2x/week), tomato soup and grilled cheese. Sundays she will make a roast, vegetables, cornbread with gravy, mini raviolis, salads (chef) - she normally cooks those days. S: popcorn (white cheddar) - sometimesonly simply salted, nabs, little debbie cakes; she enjoys salty over sweet. She eats when shes not hungry, something looks good or she is craving something. She sometimes feels overwhelmed with the chnages she has to make, discussed small manageable changes. Discussed heart healhty eating.      Intervention Plan   Intervention Prescribe, educate and counsel regarding individualized specific dietary modifications aiming towards targeted core components such as weight, hypertension, lipid management, diabetes, heart failure and other comorbidities.;Nutrition handout(s) given to patient.    Expected Outcomes Short Term Goal: Understand basic principles of dietary content, such as calories, fat, sodium, cholesterol and nutrients.;Short Term Goal: A plan has been developed with personal nutrition goals set during dietitian appointment.;Long Term Goal: Adherence to prescribed nutrition plan.           Nutrition Assessments:  MEDIFICTS Score Key:  ?70 Need to make dietary changes   40-70 Heart Healthy Diet  ? 40 Therapeutic Level Cholesterol Diet  Flowsheet Row Cardiac Rehab from 03/22/2020 in [facility] Cardiac and Pulmonary Rehab  Picture Your Plate Total Score on Admission 32     Picture  Your Plate Scores:  <62 Unhealthy dietary pattern with much room for improvement.  41-50 Dietary pattern unlikely to meet recommendations for good health and room for improvement.  51-60 More healthful dietary pattern, with some room for improvement.   >60 Healthy dietary pattern, although there may be some specific behaviors that could be improved.    Nutrition Goals Re-Evaluation:  Nutrition Goals Re-Evaluation    Dennison Name 05/12/20 1352 06/08/20 1511           Goals   Nutrition Goal ST: when making pot roast, make extra tray of non starcy vegetbales husband may not like, cook 1 extra time during the week like omelette with vegetables, whole wheat toast, fruit LT: lower saturated fat, limit red meat and processed week, fruit and vegetable intake 8x/day ST: when making pot roast, make extra tray of non starcy vegetbales husband may not like, cook 1 extra time during the week like omelette with vegetables, whole wheat toast, fruit LT: lower saturated fat, limit red meat and processed week, fruit and vegetable intake 8x/day      Comment Louie is struggling with her depression currently.  She is eating more than normal.  She was able to try a new pot roast recipe and then made vegetable stew with leftovers. She is trying to do better overall. Adalay will be talked to her doctor in the next couple of weeks regarding her depression. She goes through "waves" which  triggers different cylces of eating. She has been working on drinking more water.      Expected Outcome Short: Continue to work on healthier options for meals and away from snacking.  Long: Continue to focus on heart healthy diet. Short: Continue goals established by RD Long: Maintain heart healthy diet             Nutrition Goals Discharge (Final Nutrition Goals Re-Evaluation):  Nutrition Goals Re-Evaluation - 06/08/20 1511      Goals   Nutrition Goal ST: when making pot roast, make extra tray of non starcy vegetbales husband may  not like, cook 1 extra time during the week like omelette with vegetables, whole wheat toast, fruit LT: lower saturated fat, limit red meat and processed week, fruit and vegetable intake 8x/day    Comment Bryana will be talked to her doctor in the next couple of weeks regarding her depression. She goes through "waves" which triggers different cylces of eating. She has been working on drinking more water.    Expected Outcome Short: Continue goals established by RD Long: Maintain heart healthy diet           Psychosocial: Target Goals: Acknowledge presence or absence of significant depression and/or stress, maximize coping skills, provide positive support system. Participant is able to verbalize types and ability to use techniques and skills needed for reducing stress and depression.   Education: Stress, Anxiety, and Depression - Group verbal and visual presentation to define topics covered.  Reviews how body is impacted by stress, anxiety, and depression.  Also discusses healthy ways to reduce stress and to treat/manage anxiety and depression.  Written material given at graduation. Flowsheet Row Cardiac Rehab from 05/11/2020 in [facility] Cardiac and Pulmonary Rehab  Date 05/11/20  Educator [facility]  Instruction Review Code 1- [employer] Understanding      Education: Sleep Hygiene -Provides group verbal and written instruction about how sleep can affect your health.  Define sleep hygiene, discuss sleep cycles and impact of sleep habits. Review good sleep hygiene tips.    Initial Review & Psychosocial Screening:  Initial Psych Review & Screening - 03/21/20 1344      Initial Review   Current issues with Current Psychotropic Meds;Current Anxiety/Panic      Family Dynamics   Good Support System? Yes    Comments She sometimes gets anxiety with the heart issues she has gone through. She can look to her husband and her four puppies.      Barriers   Psychosocial barriers to participate in program The  patient should benefit from training in stress management and relaxation.      Screening Interventions   Interventions Encouraged to exercise;To provide support and resources with identified psychosocial needs;Provide feedback about the scores to participant    Expected Outcomes Short Term goal: Utilizing psychosocial counselor, staff and physician to assist with identification of specific Stressors or current issues interfering with healing process. Setting desired goal for each stressor or current issue identified.;Long Term Goal: Stressors or current issues are controlled or eliminated.;Short Term goal: Identification and review with participant of any Quality of Life or Depression concerns found by scoring the questionnaire.;Long Term goal: The participant improves quality of Life and PHQ9 Scores as seen by post scores and/or verbalization of changes           Quality of Life Scores:   Quality of Life - 03/22/20 1513      Quality of Life   Select Quality of Life  Quality of Life Scores   Health/Function Pre 23.18 %    Socioeconomic Pre 28.75 %    Psych/Spiritual Pre 24.5 %    Family Pre 27.6 %    GLOBAL Pre 24.97 %          Scores of 19 and below usually indicate a poorer quality of life in these areas.  A difference of  2-3 points is a clinically meaningful difference.  A difference of 2-3 points in the total score of the Quality of Life Index has been associated with significant improvement in overall quality of life, self-image, physical symptoms, and general health in studies assessing change in quality of life.  PHQ-9: Recent Review Flowsheet Data    Depression screen [facility] 2/9 06/09/2020 05/12/2020 04/18/2020 03/22/2020 03/14/2020   Decreased Interest _0 Down, Depressed, Hopeless 0 _1 PHQ - 2 Score _2 Altered sleeping _3 Tired, decreased energy _4 Change in appetite _5 Feeling bad or failure about yourself  0 _6 Trouble concentrating 0 _7 Moving slowly or fidgety/restless 0 0 0 0 2   Suicidal thoughts 0 0 0 0 0   PHQ-9 Score _8 Difficult doing work/chores Somewhat difficult Somewhat difficult Not difficult at all Not difficult at all -     Interpretation of Total Score  Total Score Depression Severity:  1-4 = Minimal depression, 5-9 = Mild depression, 10-14 = Moderate depression, 15-19 = Moderately severe depression, 20-27 = Severe depression   Psychosocial Evaluation and Intervention:  Psychosocial Evaluation - 03/21/20 1346      Psychosocial Evaluation & Interventions   Interventions Encouraged to exercise with the program and follow exercise prescription;Stress management education;Relaxation education    Comments She sometimes gets anxiety with the heart issues she has gone through. She can look to her husband and her four puppies.    Expected Outcomes Short: Exercise regularly to support mental health and notify staff of any changes. Long: maintain mental health and well being through teaching of rehab or prescribed medications independently.    Continue Psychosocial Services  Follow up required by staff           Psychosocial Re-Evaluation:  Psychosocial Re-Evaluation    Mesita Name 04/18/20 1408 05/12/20 1344 06/08/20 1357 06/09/20 1336       Psychosocial Re-Evaluation   Current issues with Current Sleep Concerns;History of Depression Current Sleep Concerns;Current Psychotropic Meds;Current Depression;Current Anxiety/Panic Current Sleep Concerns;Current Psychotropic Meds;Current Depression;Current Anxiety/Panic Current Depression    Comments Gavyn is doing well mentally. She reports she has been having bad dreams at night which interupts her sleep.Though, she can go back to sleep pretty soon after she wakes up. Talked about doing some type of meditation or deep breathing before bed and she already prays before she goes to sleep which calms her mind down. She does have  good suport from family. She is currently taking medication for depression and anxiety and she feels comfortable with where she is at. Denied wanting to see a therapist at this time. Her PHQ did go up 1 point, staff will continue to monitor. Laurisa is in a little funk currently.  Her PHQ has crept up some over the past 3 weeks and she can feel the  effects of it.  She has been sick.  Her husband has noticed too and is trying to pull her out too.  She is already on a high dose anti-depressant so she was encouraged to call her doctor's office to see if they can help her with a booster of meds or a change.  She is determined to make a change.  She has not been sleeping well recently and then wants to sleep all day in her chair. She has noted that her anxiety has gotten a little worse recently.  She did sit in on stress class yesterday. Tylena's sleep has been much better as she feels less anxiety. However,  she reports feeling "waves of depression" where some times she feels good and some time shes bad. She is already on the high dose od anti-depressant and stressed the important to talked to her doctor to look at other options and/or look at changing different medications, possibly introduce talk therapy. Her appt with her doctor is in a couple of weeks. Talked about taking walks at home to relieve some stress as she is not doing much exercise at home right now. Correlation of exercise nad mood boosting can help. Patient states she is going to try to incorportate that in her lifestyle. PHQ has improved from 12 to a 7.  She is feeling better overall, see previous note.    Expected Outcomes Short: Talk to doctor aobut night terrors Long: Utilize exercise to help manage stress and maintain positive attitude Short: Talk to doctor about depression symptoms and anxiety Long: Continue to manage depression positively. Short: Address depression issues with doctor Long: Maintain positive attitude Short: Address depression issues  with doctor Long: Maintain positive attitude    Interventions Encouraged to attend Cardiac Rehabilitation for the exercise Encouraged to attend Cardiac Rehabilitation for the exercise;Stress management education Encouraged to attend Cardiac Rehabilitation for the exercise;Stress management education Encouraged to attend Cardiac Rehabilitation for the exercise;Stress management education    Continue Psychosocial Services  Follow up required by staff Follow up required by staff Follow up required by staff Follow up required by staff           Psychosocial Discharge (Final Psychosocial Re-Evaluation):  Psychosocial Re-Evaluation - 06/09/20 1336      Psychosocial Re-Evaluation   Current issues with Current Depression    Comments PHQ has improved from 12 to a 7.  She is feeling better overall, see previous note.    Expected Outcomes Short: Address depression issues with doctor Long: Maintain positive attitude    Interventions Encouraged to attend Cardiac Rehabilitation for the exercise;Stress management education    Continue Psychosocial Services  Follow up required by staff           Vocational Rehabilitation: Provide vocational rehab assistance to qualifying candidates.   Vocational Rehab Evaluation & Intervention:   Education: Education Goals: Education classes will be provided on a variety of topics geared toward better understanding of heart health and risk factor modification. Participant will state understanding/return demonstration of topics presented as noted by education test scores.  Learning Barriers/Preferences:  Learning Barriers/Preferences - 03/21/20 1342      Learning Barriers/Preferences   Learning Barriers None    Learning Preferences None           General Cardiac Education Topics:  AED/CPR: - Group verbal and written instruction with the use of models to demonstrate the basic use of the AED with the basic ABC's of resuscitation.   Anatomy and Cardiac  Procedures: - Group verbal and visual presentation and models provide information about basic cardiac anatomy and function. Reviews the testing methods done to diagnose heart disease and the outcomes of the test results. Describes the treatment choices: Medical Management, Angioplasty, or Coronary Bypass Surgery for treating various heart conditions including Myocardial Infarction, Angina, Valve Disease, and Cardiac Arrhythmias.  Written material given at graduation. Flowsheet Row Cardiac Rehab from 05/11/2020 in [facility] Cardiac and Pulmonary Rehab  Date 03/30/20  Educator [facility]  Instruction Review Code 1- Verbalizes Understanding      Medication Safety: - Group verbal and visual instruction to review commonly prescribed medications for heart and lung disease. Reviews the medication, class of the drug, and side effects. Includes the steps to properly store meds and maintain the prescription regimen.  Written material given at graduation.   Intimacy: - Group verbal instruction through game format to discuss how heart and lung disease can affect sexual intimacy. Written material given at graduation..   Know Your Numbers and Heart Failure: - Group verbal and visual instruction to discuss disease risk factors for cardiac and pulmonary disease and treatment options.  Reviews associated critical values for Overweight/Obesity, Hypertension, Cholesterol, and Diabetes.  Discusses basics of heart failure: signs/symptoms and treatments.  Introduces Heart Failure Zone chart for action plan for heart failure.  Written material given at graduation.   Infection Prevention: - Provides verbal and written material to individual with discussion of infection control including proper hand washing and proper equipment cleaning during exercise session. Flowsheet Row Cardiac Rehab from 05/11/2020 in [facility] Cardiac and Pulmonary Rehab  Date 03/22/20  Educator AS  Instruction Review Code 1- Verbalizes Understanding       Falls Prevention: - Provides verbal and written material to individual with discussion of falls prevention and safety. Flowsheet Row Cardiac Rehab from 05/11/2020 in [facility] Cardiac and Pulmonary Rehab  Date 03/22/20  Educator AS  Instruction Review Code 1- Verbalizes Understanding      Other: -Provides group and verbal instruction on various topics (see comments)   Knowledge Questionnaire Score:  Knowledge Questionnaire Score - 03/22/20 1514      Knowledge Questionnaire Score   Pre Score 22/26 angina, nutrition           Core Components/Risk Factors/Patient Goals at Admission:  Personal Goals and Risk Factors at Admission - 03/22/20 1517      Core Components/Risk Factors/Patient Goals on Admission    Weight Management Yes;Weight Loss    Intervention Weight Management: Develop a combined nutrition and exercise program designed to reach desired caloric intake, while maintaining appropriate intake of nutrient and fiber, sodium and fats, and appropriate energy expenditure required for the weight goal.;Weight Management: Provide education and appropriate resources to help participant work on and attain dietary goals.;Weight Management/Obesity: Establish reasonable short term and long term weight goals.;Obesity: Provide education and appropriate resources to help participant work on and attain dietary goals.    Expected Outcomes Short Term: Continue to assess and modify interventions until short term weight is achieved;Long Term: Adherence to nutrition and physical activity/exercise program aimed toward attainment of established weight goal;Weight Loss: Understanding of general recommendations for a balanced deficit meal plan, which promotes 1-2 lb weight loss per week and includes a negative energy balance of [phone] kcal/d;Understanding recommendations for meals to include 15-35% energy as protein, 25-35% energy from fat, 35-60% energy from carbohydrates, less than 239m of dietary  cholesterol, 20-35 gm of total fiber daily;Understanding of distribution of calorie intake throughout the day with the  consumption of 4-5 meals/snacks    Diabetes Yes    Intervention Provide education about signs/symptoms and action to take for hypo/hyperglycemia.;Provide education about proper nutrition, including hydration, and aerobic/resistive exercise prescription along with prescribed medications to achieve blood glucose in normal ranges: Fasting glucose 65-99 mg/dL    Expected Outcomes Short Term: Participant verbalizes understanding of the signs/symptoms and immediate care of hyper/hypoglycemia, proper foot care and importance of medication, aerobic/resistive exercise and nutrition plan for blood glucose control.;Long Term: Attainment of HbA1C < 7%.    Hypertension Yes    Intervention Provide education on lifestyle modifcations including regular physical activity/exercise, weight management, moderate sodium restriction and increased consumption of fresh fruit, vegetables, and low fat dairy, alcohol moderation, and smoking cessation.;Monitor prescription use compliance.    Expected Outcomes Short Term: Continued assessment and intervention until BP is < 140/62m HG in hypertensive participants. < 130/866mHG in hypertensive participants with diabetes, heart failure or chronic kidney disease.;Long Term: Maintenance of blood pressure at goal levels.    Lipids Yes    Intervention Provide education and support for participant on nutrition & aerobic/resistive exercise along with prescribed medications to achieve LDL <7063mHDL >107m56m  Expected Outcomes Long Term: Cholesterol controlled with medications as prescribed, with individualized exercise RX and with personalized nutrition plan. Value goals: LDL < 70mg21mL > 40 mg.;Short Term: Participant states understanding of desired cholesterol values and is compliant with medications prescribed. Participant is following exercise prescription and nutrition  guidelines.           Education:Diabetes - Individual verbal and written instruction to review signs/symptoms of diabetes, desired ranges of glucose level fasting, after meals and with exercise. Acknowledge that pre and post exercise glucose checks will be done for 3 sessions at entry of program. [facility]  Date 03/21/20  Educator [facility] Pat[facility] Review Code 1- Verbalizes Understanding      Core Components/Risk Factors/Patient Goals Review:   Goals and Risk Factor Review    Row Name 04/18/20 1403 05/12/20 1405 06/08/20 1352         Core Components/Risk Factors/Patient Goals Review   Personal Goals Review Weight Management/Obesity;Hypertension;Diabetes Weight Management/Obesity;Hypertension;Diabetes Weight Management/Obesity;Hypertension;Diabetes     Review LeahaJannisoing well. She checks her sugar once daily, usually in the morning and it ranges around 130s. She is aware to contact her doctor if she starts getting abnormal numbers. She also checks her BP at home, reports systolic around 130s 248Gdiastolic [id number] compliant with taking BP at home and checks it several times per week. Stays compliants with all her medications. At this time LeahaJazaranot trying to lose weight but thinks she needs to. She is meeting with the  RD after class today and looking foward to learning more. She did lose 30 lbs when she had COVID, but gained it right back. Doesn't weigh herself at home very often and encouraged her to maintain a log. LeahaAnaliliagained 4 lbs in the last week with her extra eating from depression.  We talked about the importance of calling her doctor.  She has not been checking her sugars as she has not felt like it and doesn't really care right now.  Her pressures have been doing well in rehab and she has not really worried about them as much.  She thinks that it is reading her high so we talked about bringing it  into class to check  against what we are hearing. Berdine doesn't check blood sugars periodically but several times per week- states doctor says OK as long as they stay consistent around 130 in the morning. She knows to report any abnormal values. She has an endo appointment in the next couple of weeks where she will get a new A1C, last time it was 7% and she hopes it goes down. Weight has been staying stable and she plans to talk to her doctor at her appointment in a couple weeks to address her depression. BP have been good in rehab, a couple are elevated but she contributes it to her stress/anxiety. She does not weigh herself at home but will resume when she graduates from the program.     Expected Outcomes Short: Weigh herself at home more consistently Long: Continue to manage lifestyle risk factors Short: Call her doctor about her depression Long: Continue to monitor risk factors. Short: Continue to manage sugars and weight at home Long: Continue to manage lifestyle risk factors            Core Components/Risk Factors/Patient Goals at Discharge (Final Review):   Goals and Risk Factor Review - 06/08/20 1352      Core Components/Risk Factors/Patient Goals Review   Personal Goals Review Weight Management/Obesity;Hypertension;Diabetes    Review Aqsa doesn't check blood sugars periodically but several times per week- states doctor says OK as long as they stay consistent around 130 in the morning. She knows to report any abnormal values. She has an endo appointment in the next couple of weeks where she will get a new A1C, last time it was 7% and she hopes it goes down. Weight has been staying stable and she plans to talk to her doctor at her appointment in a couple weeks to address her depression. BP have been good in rehab, a couple are elevated but she contributes it to her stress/anxiety. She does not weigh herself at home but will resume when she graduates from the program.    Expected Outcomes Short:  Continue to manage sugars and weight at home Long: Continue to manage lifestyle risk factors           ITP Comments:  ITP Comments    Row Name 03/21/20 1342 03/22/20 1526 03/28/20 1336 04/06/20 0520 05/04/20 0844   ITP Comments Virtual Visit completed. Patient informed on EP and RD appointment and 6 Minute walk test. Patient also informed of patient health questionnaires on My Chart. Patient Verbalizes understanding. Visit diagnosis can be found in [facility]12/10/2019. Completed 6MWT and gym orientation. Initial ITP created and sent for review to Dr. Emily Filbert, Medical Director. First full day of exercise!  Patient was oriented to gym and equipment including functions, settings, policies, and procedures.  Patient's individual exercise prescription and treatment plan were reviewed.  All starting workloads were established based on the results of the 6 minute walk test done at initial orientation visit.  The plan for exercise progression was also introduced and progression will be customized based on patient's performance and goals. 30 Day review completed. Medical Director ITP review done, changes made as directed, and signed approval by Medical Director. 30 Day review completed. Medical Director ITP review done, changes made as directed, and signed approval by Medical Director.   [place] Name 06/01/20 0606 06/29/20 0707         ITP Comments 30 Day review completed. Medical Director ITP review done, changes made as directed, and signed approval by Medical Director. 30 Day review completed.  Medical Director ITP review done, changes made as directed, and signed approval by Medical Director.             Comments:

## 2020-06-29 NOTE — Progress Notes (Signed)
Daily Session Note  Patient Details  Name: Amanda Wiley MRN: [id number] Date of Birth: 1955-09-08 Referring Provider:   Flowsheet Row Cardiac Rehab from 03/22/2020 in [facility]  Referring Provider Fath      Encounter Date: 06/29/2020  Check In:  Session Check In - 06/29/20 [place]      Check-In   Supervising physician immediately available to respond to emergencies See telemetry face sheet for immediately available ER MD    Location [facility]iac & Pul[facility]    Staff Present Birdie Sons, MPA, RN;Joseph Lou Miner, [place] Exercise Physiologist    Virtual Visit No    Medication changes reported     No    Fall or balance concerns reported    No    Warm-up and Cool-down Performed on first and last piece of equipment    Resistance Training Performed Yes    VAD Patient? No    PAD/SET Patient? No      Pain Assessment   Currently in Pain? No/denies              Social History   Tobacco Use  Smoking Status Former Smoker  . Packs/day: 2.00  . Years: 15.00  . Pack years: 30.00  . Types: Cigarettes  . Quit date: 04/09/1997  . Years since quitting: 23.2  Smokeless Tobacco Never Used    Goals Met:  Independence with exercise equipment Exercise tolerated well No report of cardiac concerns or symptoms Strength training completed today  Goals Unmet:  Not Applicable  Comments: Pt able to follow exercise prescription today without complaint.  Will continue to monitor for progression.    Dr. Emily Filbert is Medical Director for [place] [facility].

## 2020-06-30 ENCOUNTER — Encounter: Payer: Medicare Other | Admitting: *Deleted

## 2020-06-30 ENCOUNTER — Other Ambulatory Visit: Payer: Self-pay

## 2020-06-30 DIAGNOSIS — I214 Non-ST elevation (NSTEMI) myocardial infarction: Secondary | ICD-10-CM | POA: Diagnosis not present

## 2020-06-30 NOTE — Progress Notes (Signed)
Discharge Progress Report  Patient Details  Name: Amanda Wiley MRN: [id number] Date of Birth: 10-Nov-1955 Referring Provider:   Flowsheet Row Cardiac Rehab from 03/22/2020 in [facility]  Referring Provider Fath       Number of Visits: 77  Reason for Discharge:  Patient reached a stable level of exercise. Patient independent in their exercise. Patient has met program and personal goals.  Smoking History:  Social History   Tobacco Use  Smoking Status Former Smoker  . Packs/day: 2.00  . Years: 15.00  . Pack years: 30.00  . Types: Cigarettes  . Quit date: 04/09/1997  . Years since quitting: 23.2  Smokeless Tobacco Never Used    Diagnosis:  NSTEMI (non-ST elevated myocardial infarction) ([place])  ADL [facility]SD:   Initial Exercise Prescription:  Initial Exercise Prescription - 03/22/20 1500      Date of Initial Exercise RX and Referring Provider   Date 03/22/20    Referring Provider Fath      Treadmill   MPH 2    Grade 1    Minutes 15    METs 2.8      Recumbant Bike   Level 1    RPM 60    Watts 38    Minutes 15    METs 3.2      Arm Ergometer   Level 1    RPM 25    Minutes 15    METs 3      REL-XR   Level 2    Speed 50    Minutes 15    METs 3.2      Prescription Details   Frequency (times per week) 3    Duration Progress to 30 minutes of continuous aerobic without signs/symptoms of physical distress      Intensity   THRR 40-80% of Max Heartrate 100-136    Ratings of Perceived Exertion 11-13    Perceived Dyspnea 0-4      Resistance Training   Training Prescription Yes    Weight 3 lb    Reps 10-15           Discharge Exercise Prescription (Final Exercise Prescription Changes):  Exercise Prescription Changes - 06/27/20 1500      Response to Exercise   Blood Pressure (Admit) 142/62    Blood Pressure (Exercise) 146/74    Blood Pressure (Exit) 130/60    Heart Rate (Admit) 68 bpm    Heart Rate (Exercise) 115 bpm    Heart  Rate (Exit) 78 bpm    Rating of Perceived Exertion (Exercise) 12    Symptoms none    Duration Continue with 30 min of aerobic exercise without signs/symptoms of physical distress.    Intensity THRR unchanged      Progression   Progression Continue to progress workloads to maintain intensity without signs/symptoms of physical distress.    Average METs 2.23      Resistance Training   Training Prescription Yes    Weight 3 lb    Reps 10-15      Interval Training   Interval Training No      Treadmill   MPH 2.1    Grade 1    Minutes 15    METs 2.9      Recumbant Elliptical   Level 2    Minutes 15    METs 2      T5 Nustep   Level 1    Minutes 15    METs 1.8  Home Exercise Plan   Plans to continue exercise at Home (comment)   YouTube videos   Frequency Add 2 additional days to program exercise sessions.   Start with 1 extra day   Initial Home Exercises Provided 04/18/20           Functional Capacity:  6 Minute Walk    Row Name 03/22/20 1503 06/15/20 1348       6 Minute Walk   Phase Initial Discharge    Distance 1170 feet 1360 feet    Distance % Change -- 16.2 %    Distance Feet Change -- 190 ft    Walk Time 6 minutes 6 minutes    # of Rest Breaks 0 0    MPH 2.2 2.6    METS 3.2 3.7    RPE 8 11    Perceived Dyspnea  1 1    VO2 Peak 11.27 12.94    Symptoms Yes (comment) No    Comments slightly SOB --    Resting HR 64 bpm 77 bpm    Resting BP 138/68 128/66    Resting Oxygen Saturation  95 % --    Exercise Oxygen Saturation  during 6 min walk 91 % --    Max Ex. HR 113 bpm 123 bpm    Max Ex. BP 208/62 206/62    2 Minute Post BP 124/68 176/68           Psychological, QOL, Others - Outcomes: PHQ 2/9: Depression screen [facility] 2/9 06/30/2020 06/09/2020 05/12/2020 04/18/2020 03/22/2020  Decreased Interest 1 1 1 1 1   Down, Depressed, Hopeless 1 0 1 1 1   PHQ - 2 Score 2 1 2 2 2   Altered sleeping 1 2 3 1 1   Tired, decreased energy 1 2 3 1 1   Change in appetite 1  2 2 2 1   Feeling bad or failure about yourself  0 0 1 1 1   Trouble concentrating 1 0 1 1 1   Moving slowly or fidgety/restless 0 0 0 0 0  Suicidal thoughts 0 0 0 0 0  PHQ-9 Score 6 7 12 8 7   Difficult doing work/chores Somewhat difficult Somewhat difficult Somewhat difficult Not difficult at all Not difficult at all  Some recent data might be hidden    Quality of Life:  Quality of Life - 06/30/20 1325      Quality of Life   Select Quality of Life      Quality of Life Scores   Health/Function Pre 23.18 %    Health/Function Post 23.7 %    Health/Function % Change 2.24 %    Socioeconomic Pre 28.75 %    Socioeconomic Post 21.67 %    Socioeconomic % Change  -24.63 %    Psych/Spiritual Pre 24.5 %    Psych/Spiritual Post 23.14 %    Psych/Spiritual % Change -5.55 %    Family Pre 27.6 %    Family Post 25.2 %    Family % Change -8.7 %    GLOBAL Pre 24.97 %    GLOBAL Post 23.44 %    GLOBAL % Change -6.13 %           Nutrition & Weight - Outcomes:  Pre Biometrics - 03/22/20 1511      Pre Biometrics   Height 5' 5.5" (1.664 m)    Weight 219 lb 4.8 oz (99.5 kg)    BMI (Calculated) 35.93    Single Leg Stand 5 seconds  Nutrition:  Nutrition Therapy & Goals - 04/18/20 1435      Nutrition Therapy   Diet Heart healthy, low Na    Protein (specify units) 80g    Fiber 25 grams    Whole Grain Foods 3 servings    Saturated Fats 12 max. grams    Fruits and Vegetables 8 servings/day    Sodium 1.5 grams      Personal Nutrition Goals   Nutrition Goal ST: when making pot roast, make extra tray of non starcy vegetbales husband may not like, cook 1 extra time during the week like omelette with vegetables, whole wheat toast, fruit LT: lower saturated fat, limit red meat and processed week, fruit and vegetable intake 8x/day    Comments She cut back on using salt after cooking (still cooks with salt), cut back on fried foods). She will cook to cater to her husbands needs (he  has an ostomy); she loves brussells sprouts, greens, corn, cabbage, homemade vegetable soup. She will make vegetable soup and freeze it. Sleep in until 930, 10am - coffee (sweet n' low,non-dairy creamer). Drinks: diet caffiene free coke. L: bologna sandwich, leftovers (chinese, broccoli cheddar soup for example D: go out most often: taco salads, quarter pounder (someitmes), cheeseburger. Soups (homemade potato soup 2x/week), tomato soup and grilled cheese. Sundays she will make a roast, vegetables, cornbread with gravy, mini raviolis, salads (chef) - she normally cooks those days. S: popcorn (white cheddar) - sometimesonly simply salted, nabs, little debbie cakes; she enjoys salty over sweet. She eats when shes not hungry, something looks good or she is craving something. She sometimes feels overwhelmed with the chnages she has to make, discussed small manageable changes. Discussed heart healhty eating.      Intervention Plan   Intervention Prescribe, educate and counsel regarding individualized specific dietary modifications aiming towards targeted core components such as weight, hypertension, lipid management, diabetes, heart failure and other comorbidities.;Nutrition handout(s) given to patient.    Expected Outcomes Short Term Goal: Understand basic principles of dietary content, such as calories, fat, sodium, cholesterol and nutrients.;Short Term Goal: A plan has been developed with personal nutrition goals set during dietitian appointment.;Long Term Goal: Adherence to prescribed nutrition plan.           Nutrition Discharge:   Education Questionnaire Score:  Knowledge Questionnaire Score - 06/30/20 1325      Knowledge Questionnaire Score   Post Score 20/26           Goals reviewed with patient; copy given to patient.

## 2020-06-30 NOTE — Progress Notes (Signed)
Cardiac Individual Treatment Plan  Patient Details  Name: EMARA LICHTER MRN: [id number] Date of Birth: 19-Jul-1955 Referring Provider:   Flowsheet Row Cardiac Rehab from 03/22/2020 in [facility] Cardiac and Pulmonary Rehab  Referring Provider Fath      Initial Encounter Date:  Flowsheet Row Cardiac Rehab from 03/22/2020 in [facility] Cardiac and Pulmonary Rehab  Date 03/22/20      Visit Diagnosis: NSTEMI (non-ST elevated myocardial infarction) [facility])  Patient's Home Medications on Admission:  Current Outpatient Medications:  .  acetaminophen (TYLENOL) 325 MG tablet, Take 2 tablets (650 mg total) by mouth every 6 (six) hours as needed for mild pain (or Fever >/= 101)., Disp:  , Rfl:  .  albuterol (PROVENTIL) (2.5 MG/3ML) 0.083% nebulizer solution, Take 2.5 mg by nebulization every 6 (six) hours as needed for wheezing., Disp: , Rfl:  .  amLODipine (NORVASC) 5 MG tablet, Take 1 tablet (5 mg total) by mouth daily., Disp: 30 tablet, Rfl: 0 .  amLODipine (NORVASC) 5 MG tablet, Take 1 tablet by mouth daily., Disp: , Rfl:  .  aspirin 81 MG chewable tablet, Chew 1 tablet (81 mg total) by mouth daily., Disp: 30 tablet, Rfl: 0 .  aspirin 81 MG chewable tablet, Chew 1 tablet by mouth daily., Disp: , Rfl:  .  buPROPion (WELLBUTRIN XL) 300 MG 24 hr tablet, Take 1 tablet (300 mg total) by mouth daily., Disp: 90 tablet, Rfl: 1 .  Dulaglutide (TRULICITY) 1.5 RF/1.6BW SOPN, Inject 1.5 mg into the skin once a week. ON SUNDAYS, Disp: , Rfl:  .  fluticasone-salmeterol (ADVAIR HFA) 230-21 MCG/ACT inhaler, Inhale 2 puffs into the lungs 2 (two) times daily., Disp: , Rfl:  .  hyoscyamine (LEVSIN SL) 0.125 MG SL tablet, Place 0.125 mg under the tongue as needed., Disp: , Rfl:  .  insulin glargine (LANTUS) 100 UNIT/ML injection, Inject 20 Units into the skin at bedtime. , Disp: , Rfl:  .  ipratropium (ATROVENT) 0.06 % nasal spray, Place 2 sprays into both nostrils 3 (three) times daily as needed for rhinitis.  (Patient not  taking: Reported on 03/21/2020), Disp: , Rfl:  .  Ipratropium-Albuterol (COMBIVENT) 20-100 MCG/ACT AERS respimat, Inhale 2 puffs into the lungs every 4 (four) hours while awake. (Patient taking differently: Inhale 1 puff into the lungs 2 (two) times daily. And as needed as a rescue inhaler.), Disp: 4 g, Rfl: 2 .  isosorbide mononitrate (IMDUR) 30 MG 24 hr tablet, Take 1 tablet (30 mg total) by mouth daily., Disp: 30 tablet, Rfl: 0 .  JARDIANCE 25 MG TABS tablet, Take 25 mg by mouth daily., Disp: , Rfl:  .  losartan (COZAAR) 25 MG tablet, Take 1 tablet (25 mg total) by mouth daily., Disp: 30 tablet, Rfl: 0 .  metoprolol tartrate (LOPRESSOR) 25 MG tablet, Take 0.5 tablets (12.5 mg total) by mouth 2 (two) times daily., Disp: 30 tablet, Rfl: 0 .  nitroGLYCERIN (NITROSTAT) 0.4 MG SL tablet, Place 1 tablet (0.4 mg total) under the tongue every 5 (five) minutes as needed for chest pain., Disp: 30 tablet, Rfl: 0 .  prasugrel (EFFIENT) 10 MG TABS tablet, Take 1 tablet (10 mg total) by mouth daily., Disp: 30 tablet, Rfl: 0 .  RABEprazole (ACIPHEX) 20 MG tablet, Take 1 tablet (20 mg total) by mouth in the morning and at bedtime., Disp: 180 tablet, Rfl: 1 .  rosuvastatin (CRESTOR) 5 MG tablet, Take 1 tablet (5 mg total) by mouth daily., Disp: 30 tablet, Rfl: 0 .  ULTRACARE  PEN NEEDLES 32G X 4 MM MISC, , Disp: , Rfl:  .  vitamin C (VITAMIN C) 1000 MG tablet, Take 1 tablet (1,000 mg total) by mouth daily., Disp:  , Rfl:   Past Medical History: Past Medical History:  Diagnosis Date  . Arthritis   . Asthma   . Complication of anesthesia    HARD TO WAKE UP  . COPD (chronic obstructive pulmonary disease) ([place])   . Crohn's disease ([place])   . Depression   . Diabetes mellitus ([place])   . Fracture, foot    post fall  . Hypercholesterolemia   . Hypertension   . IBS (irritable bowel syndrome)   . Nephrolithiasis    s/p lithotripsy [facility])  . Vitamin D deficiency     Tobacco Use: Social  History   Tobacco Use  Smoking Status Former Smoker  . Packs/day: 2.00  . Years: 15.00  . Pack years: 30.00  . Types: Cigarettes  . Quit date: 04/09/1997  . Years since quitting: 23.2  Smokeless Tobacco Never Used    Labs: Recent Review Flowsheet Data    Labs for ITP Cardiac and Pulmonary Rehab Latest Ref Rng & Units 01/13/2019 02/02/2019 06/02/2019 10/14/2019 02/12/2020   Cholestrol 0 - 200 mg/dL - - 208(H) 210(H) 222(H)   LDLCALC 0 - 99 mg/dL - - - - -   LDLDIRECT mg/dL - - 120.0 128.0 149.0   HDL >39.00 mg/dL - - 39.20 41.20 40.50   Trlycerides 0.0 - 149.0 mg/dL - - 396.0(H) 324.0(H) 330.0(H)   Hemoglobin A1c 4.6 - 6.5 % 7.3(H) - 7.0(H) 7.6(H) 7.7(H)   PHART 7.350 - 7.450 - 7.50(H) - - -   PCO2ART 32.0 - 48.0 mmHg - 39 - - -   HCO3 20.0 - 28.0 mmol/L - 30.4(H) - - -   O2SAT % - 92.8 - - -       Exercise Target Goals: Exercise Program Goal: Individual exercise prescription set using results from initial 6 min walk test and THRR while considering  patient's activity barriers and safety.   Exercise Prescription Goal: Initial exercise prescription builds to 30-45 minutes a day of aerobic activity, 2-3 days per week.  Home exercise guidelines will be given to patient during program as part of exercise prescription that the participant will acknowledge.   Education: Aerobic Exercise: - Group verbal and visual presentation on the components of exercise prescription. Introduces F.I.T.T principle from ACSM for exercise prescriptions.  Reviews F.I.T.T. principles of aerobic exercise including progression. Written material given at graduation.   Education: Resistance Exercise: - Group verbal and visual presentation on the components of exercise prescription. Introduces F.I.T.T principle from ACSM for exercise prescriptions  Reviews F.I.T.T. principles of resistance exercise including progression. Written material given at graduation. Flowsheet Row Cardiac Rehab from 05/11/2020 in [facility]  Cardiac and Pulmonary Rehab  Date 03/30/20  Educator AS  Instruction Review Code 1- Verbalizes Understanding       Education: Exercise & Equipment Safety: - Individual verbal instruction and demonstration of equipment use and safety with use of the equipment. Flowsheet Row Cardiac Rehab from 05/11/2020 in [facility] Cardiac and Pulmonary Rehab  Date 03/22/20  Educator AS  Instruction Review Code 1- Verbalizes Understanding      Education: Exercise Physiology & General Exercise Guidelines: - Group verbal and written instruction with models to review the exercise physiology of the cardiovascular system and associated critical values. Provides general exercise guidelines with specific guidelines to those with heart or lung disease.  Education: Flexibility, Balance, Mind/Body Relaxation: - Group verbal and visual presentation with interactive activity on the components of exercise prescription. Introduces F.I.T.T principle from ACSM for exercise prescriptions. Reviews F.I.T.T. principles of flexibility and balance exercise training including progression. Also discusses the mind body connection.  Reviews various relaxation techniques to help reduce and manage stress (i.e. Deep breathing, progressive muscle relaxation, and visualization). Balance handout provided to take home. Written material given at graduation.   Activity Barriers & Risk Stratification:   6 Minute Walk:  6 Minute Walk    Row Name 03/22/20 1503 06/15/20 1348       6 Minute Walk   Phase Initial Discharge    Distance 1170 feet 1360 feet    Distance % Change -- 16.2 %    Distance Feet Change -- 190 ft    Walk Time 6 minutes 6 minutes    # of Rest Breaks 0 0    MPH 2.2 2.6    METS 3.2 3.7    RPE 8 11    Perceived Dyspnea  1 1    VO2 Peak 11.27 12.94    Symptoms Yes (comment) No    Comments slightly SOB --    Resting HR 64 bpm 77 bpm    Resting BP 138/68 128/66    Resting Oxygen Saturation  95 % --    Exercise  Oxygen Saturation  during 6 min walk 91 % --    Max Ex. HR 113 bpm 123 bpm    Max Ex. BP 208/62 206/62    2 Minute Post BP 124/68 176/68           Oxygen Initial Assessment:   Oxygen Re-Evaluation:   Oxygen Discharge (Final Oxygen Re-Evaluation):   Initial Exercise Prescription:  Initial Exercise Prescription - 03/22/20 1500      Date of Initial Exercise RX and Referring Provider   Date 03/22/20    Referring Provider Fath      Treadmill   MPH 2    Grade 1    Minutes 15    METs 2.8      Recumbant Bike   Level 1    RPM 60    Watts 38    Minutes 15    METs 3.2      Arm Ergometer   Level 1    RPM 25    Minutes 15    METs 3      REL-XR   Level 2    Speed 50    Minutes 15    METs 3.2      Prescription Details   Frequency (times per week) 3    Duration Progress to 30 minutes of continuous aerobic without signs/symptoms of physical distress      Intensity   THRR 40-80% of Max Heartrate 100-136    Ratings of Perceived Exertion 11-13    Perceived Dyspnea 0-4      Resistance Training   Training Prescription Yes    Weight 3 lb    Reps 10-15           Perform Capillary Blood Glucose checks as needed.  Exercise Prescription Changes:  Exercise Prescription Changes    Row Name 03/22/20 1500 04/04/20 1600 04/18/20 1000 04/18/20 1500 05/04/20 1300     Response to Exercise   Blood Pressure (Admit) 138/68 122/68 140/80 -- 126/64   Blood Pressure (Exercise) 208/62 174/78 180/76 -- 158/80   Blood Pressure (Exit) 124/68 120/66 134/60 -- 132/80   Heart  Rate (Admit) 64 bpm 103 bpm 95 bpm -- 86 bpm   Heart Rate (Exercise) 113 bpm 117 bpm 119 bpm -- 113 bpm   Heart Rate (Exit) 84 bpm 81 bpm 86 bpm -- 88 bpm   Oxygen Saturation (Admit) 95 % -- -- -- --   Oxygen Saturation (Exercise) 91 % -- -- -- --   Oxygen Saturation (Exit) 96 % -- -- -- --   Rating of Perceived Exertion (Exercise) _0 -- 11   Perceived Dyspnea (Exercise) 1 -- -- -- --   Symptoms SOB  none none -- none   Comments -- fourth session -- -- --   Duration -- Continue with 30 min of aerobic exercise without signs/symptoms of physical distress. Continue with 30 min of aerobic exercise without signs/symptoms of physical distress. -- Continue with 30 min of aerobic exercise without signs/symptoms of physical distress.   Intensity -- THRR unchanged THRR unchanged -- THRR unchanged     Progression   Progression -- Continue to progress workloads to maintain intensity without signs/symptoms of physical distress. Continue to progress workloads to maintain intensity without signs/symptoms of physical distress. -- Continue to progress workloads to maintain intensity without signs/symptoms of physical distress.   Average METs -- 2.45 2.11 -- 2.3     Resistance Training   Training Prescription -- Yes Yes -- Yes   Weight -- 3 lb 3 lb -- 3 lb   Reps -- 10-15 10-15 -- 10-15     Interval Training   Interval Training -- -- -- -- No     Treadmill   MPH -- 1.9 2 -- 2.1   Grade -- 1 1 -- 1   Minutes -- 15 15 -- 15   METs -- 2.63 2.72 -- 2.81     Recumbant Elliptical   Level -- -- -- -- 1.7   Minutes -- -- -- -- 15   METs -- -- -- -- 1.8     REL-XR   Level -- 2 1 -- 3   Speed -- 50 50 -- --   Minutes -- 15 15 -- 15   METs -- 2.1 1.5 -- 2.3     Home Exercise Plan   Plans to continue exercise at -- -- -- Home (comment)  YouTube videos Home (comment)  YouTube videos   Frequency -- -- -- Add 2 additional days to program exercise sessions.  Start with 1 extra day Add 2 additional days to program exercise sessions.  Start with 1 extra day   Initial Home Exercises Provided -- -- -- 04/18/20 04/18/20   Row Name 05/16/20 1600 05/31/20 1600 06/14/20 0800 06/27/20 1500       Response to Exercise   Blood Pressure (Admit) 126/74 122/72 130/64 142/62    Blood Pressure (Exercise) 146/74 144/70 196/68 146/74    Blood Pressure (Exit) 124/60 112/68 132/82 130/60    Heart Rate (Admit) 81 bpm 70  bpm 82 bpm 68 bpm    Heart Rate (Exercise) 126 bpm 106 bpm 115 bpm 115 bpm    Heart Rate (Exit) 94 bpm 86 bpm 87 bpm 78 bpm    Rating of Perceived Exertion (Exercise) _1 Symptoms none none none none    Duration Continue with 30 min of aerobic exercise without signs/symptoms of physical distress. Continue with 30 min of aerobic exercise without signs/symptoms of physical distress. Continue with 30 min of aerobic exercise without signs/symptoms of physical distress. Continue with 30  min of aerobic exercise without signs/symptoms of physical distress.    Intensity THRR unchanged THRR unchanged THRR unchanged THRR unchanged         Progression   Progression Continue to progress workloads to maintain intensity without signs/symptoms of physical distress. Continue to progress workloads to maintain intensity without signs/symptoms of physical distress. Continue to progress workloads to maintain intensity without signs/symptoms of physical distress. Continue to progress workloads to maintain intensity without signs/symptoms of physical distress.    Average METs 2.8 2.66 2.45 2.23         Resistance Training   Training Prescription Yes Yes Yes Yes    Weight 3 lb 3 lb 3 lb 3 lb    Reps 10-15 10-15 10-15 10-15         Interval Training   Interval Training -- No No No         Treadmill   MPH 2 2 2.1 2.1    Grade _0 Minutes _1 METs 2.8 2.99 2.99 2.9         Recumbant Elliptical   Level -- 1 1.5 2    Minutes -- _2 METs -- _3 REL-XR   Level 5 5 -- --    Speed 50 -- -- --    Minutes 15 15 -- --    METs 2.8 3 -- --         T5 Nustep   Level -- -- -- 1    Minutes -- -- -- 15    METs -- -- -- 1.8         Home Exercise Plan   Plans to continue exercise at -- Home (comment)  YouTube videos -- Home (comment)  YouTube videos    Frequency -- Add 2 additional days to program exercise sessions.  Start with 1 extra day -- Add 2 additional  days to program exercise sessions.  Start with 1 extra day    Initial Home Exercises Provided -- 04/18/20 -- 04/18/20           Exercise Comments:  Exercise Comments    Row Name 03/28/20 1336           Exercise Comments First full day of exercise!  Patient was oriented to gym and equipment including functions, settings, policies, and procedures.  Patient's individual exercise prescription and treatment plan were reviewed.  All starting workloads were established based on the results of the 6 minute walk test done at initial orientation visit.  The plan for exercise progression was also introduced and progression will be customized based on patient's performance and goals.              Exercise Goals and Review:  Exercise Goals    Row Name 03/22/20 1510             Exercise Goals   Increase Physical Activity Yes       Intervention Provide advice, education, support and counseling about physical activity/exercise needs.;Develop an individualized exercise prescription for aerobic and resistive training based on initial evaluation findings, risk stratification, comorbidities and participant's personal goals.       Expected Outcomes Short Term: Attend rehab on a regular basis to increase amount of physical activity.;Long Term: Add in home exercise to make exercise part of routine and to increase amount of physical activity.;Long Term: Exercising regularly  at least 3-5 days a week.       Increase Strength and Stamina Yes       Intervention Provide advice, education, support and counseling about physical activity/exercise needs.;Develop an individualized exercise prescription for aerobic and resistive training based on initial evaluation findings, risk stratification, comorbidities and participant's personal goals.       Expected Outcomes Short Term: Increase workloads from initial exercise prescription for resistance, speed, and METs.;Short Term: Perform resistance training exercises  routinely during rehab and add in resistance training at home;Long Term: Improve cardiorespiratory fitness, muscular endurance and strength as measured by increased METs and functional capacity (6MWT)       Able to understand and use rate of perceived exertion (RPE) scale Yes       Intervention Provide education and explanation on how to use RPE scale       Expected Outcomes Short Term: Able to use RPE daily in rehab to express subjective intensity level;Long Term:  Able to use RPE to guide intensity level when exercising independently       Able to understand and use Dyspnea scale Yes       Intervention Provide education and explanation on how to use Dyspnea scale       Expected Outcomes Short Term: Able to use Dyspnea scale daily in rehab to express subjective sense of shortness of breath during exertion;Long Term: Able to use Dyspnea scale to guide intensity level when exercising independently       Knowledge and understanding of Target Heart Rate Range (THRR) Yes       Intervention Provide education and explanation of THRR including how the numbers were predicted and where they are located for reference       Expected Outcomes Short Term: Able to state/look up THRR;Short Term: Able to use daily as guideline for intensity in rehab;Long Term: Able to use THRR to govern intensity when exercising independently       Able to check pulse independently Yes       Intervention Provide education and demonstration on how to check pulse in carotid and radial arteries.;Review the importance of being able to check your own pulse for safety during independent exercise       Expected Outcomes Short Term: Able to explain why pulse checking is important during independent exercise;Long Term: Able to check pulse independently and accurately       Understanding of Exercise Prescription Yes       Intervention Provide education, explanation, and written materials on patient's individual exercise prescription        Expected Outcomes Short Term: Able to explain program exercise prescription;Long Term: Able to explain home exercise prescription to exercise independently              Exercise Goals Re-Evaluation :  Exercise Goals Re-Evaluation    Row Name 03/28/20 1336 04/04/20 1652 04/18/20 1014 04/18/20 1342 04/18/20 1424     Exercise Goal Re-Evaluation   Exercise Goals Review Increase Physical Activity;Able to understand and use rate of perceived exertion (RPE) scale;Knowledge and understanding of Target Heart Rate Range (THRR);Understanding of Exercise Prescription;Increase Strength and Stamina;Able to understand and use Dyspnea scale;Able to check pulse independently Increase Physical Activity;Increase Strength and Stamina Increase Physical Activity;Increase Strength and Stamina Increase Physical Activity;Increase Strength and Stamina Increase Physical Activity;Increase Strength and Stamina;Understanding of Exercise Prescription   Comments Reviewed RPE and dyspnea scales, THR and program prescription with pt today.  Pt voiced understanding and was given a copy of  goals to take home. Wally has tolerated exercise well in her first sessions.  She has gradually increase TM from 1.50mh/0% to 1.9 mph and 1 %.  She works in [employer].  We will monitor progress. LEdelmiracontinues to tolerate exercise well.  Staff will monitor progress. -- Reviewed home exercise with pt today.  Pt plans to complete home Youtube videos for exercise.  Reviewed THR, pulse, RPE, sign and symptoms, pulse oximetery and when to call 911 or MD.  Also discussed weather considerations and indoor options.  Pt voiced understanding. Advised patient to gradually increase her home exercise to +1 day/week, and slowly work her duration up.   Expected Outcomes Short: Use RPE daily to regulate intensity. Long: Follow program prescription in THR. Short: attend consistently Long: improve overall stamina Short: attned consistently Long: increase stamina --  Short: Add in 1 day of exercise at home Long: Ability to exercise at home independently without any complications   Row Name 05/04/20 1352 05/12/20 1339 05/16/20 1652 05/31/20 1611 06/08/20 1342     Exercise Goal Re-Evaluation   Exercise Goals Review Increase Physical Activity;Increase Strength and Stamina;Understanding of Exercise Prescription Increase Physical Activity;Increase Strength and Stamina;Understanding of Exercise Prescription Increase Physical Activity;Increase Strength and Stamina Increase Physical Activity;Increase Strength and Stamina;Understanding of Exercise Prescription Increase Physical Activity;Increase Strength and Stamina;Understanding of Exercise Prescription   Comments LKirrahis doing well in rehab.  She is up to level 1.7 on the recumbent elliptical. We will continue to monitor her progress. LCorbynis doing well in rehab.  She is not doing much exercise at home as she is in a little funk currently.  We talked about using exercise to help with depression.  She is going to try again and talk with her doctor. LStarletteis progressing well and has improved her MET level by increasing load on XR.  Staff willencourage her to increase weights for strength work.  She works in correct THR range. LNiraliis doing well in rehab.  She is up to 2 METs on the recumbent elliptical.  We will continue to encourage her to try 4 lb weights and monitor her progress. LAnaizalacks motivation to do exercise at home so she hasn't done too much. She blames her mood a lot. She is already on a high dose of anti-depressant and she does plans to talk to doctor aout different options to pursue. We talked about setting a schedule to include her exercise time in. Start with 10-15 minutes and eventually progress to 30 monutes. She has an appt in the next couple of weeks with her doctor. She plans to to walk after graduation, not interested in the [place] She will be due for her 6MWT next week in which we anticipate an  improvement.   Expected Outcomes Short: Continue to move up workloads slowly  Long: Continue to build stamina Short: Start walking again at home Long; Continue to get back to routine. Short:  continue to exercise consistently Long:  increase overall stamina Short: Increase to 4 lb weights Long: Continue to improve stamina. Short: Improve on 6MWT Long: Exercise independently at home and progress up to the 30 minutes minimum   Row Name 06/14/20 0846 06/27/20 1546           Exercise Goal Re-Evaluation   Exercise Goals Review Increase Physical Activity;Increase Strength and Stamina Increase Physical Activity;Increase Strength and Stamina;Understanding of Exercise Prescription      Comments LDenny Peonhas increased speed on TM.  Staff will encourage increasing  weights to 4 lb Rabiah will be graduating this week!  She is planning to continue to walk and use videos at home for exercise.      Expected Outcomes Short: complete HT program Long: maintain exercise on her own Graduate and continue to exercise independently             Discharge Exercise Prescription (Final Exercise Prescription Changes):  Exercise Prescription Changes - 06/27/20 1500      Response to Exercise   Blood Pressure (Admit) 142/62    Blood Pressure (Exercise) 146/74    Blood Pressure (Exit) 130/60    Heart Rate (Admit) 68 bpm    Heart Rate (Exercise) 115 bpm    Heart Rate (Exit) 78 bpm    Rating of Perceived Exertion (Exercise) 12    Symptoms none    Duration Continue with 30 min of aerobic exercise without signs/symptoms of physical distress.    Intensity THRR unchanged      Progression   Progression Continue to progress workloads to maintain intensity without signs/symptoms of physical distress.    Average METs 2.23      Resistance Training   Training Prescription Yes    Weight 3 lb    Reps 10-15      Interval Training   Interval Training No      Treadmill   MPH 2.1    Grade 1    Minutes 15    METs 2.9       Recumbant Elliptical   Level 2    Minutes 15    METs 2      T5 Nustep   Level 1    Minutes 15    METs 1.8      Home Exercise Plan   Plans to continue exercise at Home (comment)   YouTube videos   Frequency Add 2 additional days to program exercise sessions.   Start with 1 extra day   Initial Home Exercises Provided 04/18/20           Nutrition:  Target Goals: Understanding of nutrition guidelines, daily intake of sodium <1533m, cholesterol <2042m calories 30% from fat and 7% or less from saturated fats, daily to have 5 or more servings of fruits and vegetables.  Education: All About Nutrition: -Group instruction provided by verbal, written material, interactive activities, discussions, models, and posters to present general guidelines for heart healthy nutrition including fat, fiber, MyPlate, the role of sodium in heart healthy nutrition, utilization of the nutrition label, and utilization of this knowledge for meal planning. Follow up email sent as well. Written material given at graduation.   Biometrics:  Pre Biometrics - 03/22/20 1511      Pre Biometrics   Height 5' 5.5" (1.664 m)    Weight 219 lb 4.8 oz (99.5 kg)    BMI (Calculated) 35.93    Single Leg Stand 5 seconds            Nutrition Therapy Plan and Nutrition Goals:  Nutrition Therapy & Goals - 04/18/20 1435      Nutrition Therapy   Diet Heart healthy, low Na    Protein (specify units) 80g    Fiber 25 grams    Whole Grain Foods 3 servings    Saturated Fats 12 max. grams    Fruits and Vegetables 8 servings/day    Sodium 1.5 grams      Personal Nutrition Goals   Nutrition Goal ST: when making pot roast, make extra tray of non starcy vegetbales  husband may not like, cook 1 extra time during the week like omelette with vegetables, whole wheat toast, fruit LT: lower saturated fat, limit red meat and processed week, fruit and vegetable intake 8x/day    Comments She cut back on using salt after cooking  (still cooks with salt), cut back on fried foods). She will cook to cater to her husbands needs (he has an ostomy); she loves brussells sprouts, greens, corn, cabbage, homemade vegetable soup. She will make vegetable soup and freeze it. Sleep in until 930, 10am - coffee (sweet n' low,non-dairy creamer). Drinks: diet caffiene free coke. L: bologna sandwich, leftovers (chinese, broccoli cheddar soup for example D: go out most often: taco salads, quarter pounder (someitmes), cheeseburger. Soups (homemade potato soup 2x/week), tomato soup and grilled cheese. Sundays she will make a roast, vegetables, cornbread with gravy, mini raviolis, salads (chef) - she normally cooks those days. S: popcorn (white cheddar) - sometimesonly simply salted, nabs, little debbie cakes; she enjoys salty over sweet. She eats when shes not hungry, something looks good or she is craving something. She sometimes feels overwhelmed with the chnages she has to make, discussed small manageable changes. Discussed heart healhty eating.      Intervention Plan   Intervention Prescribe, educate and counsel regarding individualized specific dietary modifications aiming towards targeted core components such as weight, hypertension, lipid management, diabetes, heart failure and other comorbidities.;Nutrition handout(s) given to patient.    Expected Outcomes Short Term Goal: Understand basic principles of dietary content, such as calories, fat, sodium, cholesterol and nutrients.;Short Term Goal: A plan has been developed with personal nutrition goals set during dietitian appointment.;Long Term Goal: Adherence to prescribed nutrition plan.           Nutrition Assessments:  MEDIFICTS Score Key:  ?70 Need to make dietary changes   40-70 Heart Healthy Diet  ? 40 Therapeutic Level Cholesterol Diet  Flowsheet Row Cardiac Rehab from 06/30/2020 in [facility] Cardiac and Pulmonary Rehab  Picture Your Plate Total Score on Discharge 43     Picture  Your Plate Scores:  <18 Unhealthy dietary pattern with much room for improvement.  41-50 Dietary pattern unlikely to meet recommendations for good health and room for improvement.  51-60 More healthful dietary pattern, with some room for improvement.   >60 Healthy dietary pattern, although there may be some specific behaviors that could be improved.    Nutrition Goals Re-Evaluation:  Nutrition Goals Re-Evaluation    [place] Name 05/12/20 1352 06/08/20 1511           Goals   Nutrition Goal ST: when making pot roast, make extra tray of non starcy vegetbales husband may not like, cook 1 extra time during the week like omelette with vegetables, whole wheat toast, fruit LT: lower saturated fat, limit red meat and processed week, fruit and vegetable intake 8x/day ST: when making pot roast, make extra tray of non starcy vegetbales husband may not like, cook 1 extra time during the week like omelette with vegetables, whole wheat toast, fruit LT: lower saturated fat, limit red meat and processed week, fruit and vegetable intake 8x/day      Comment Curtisha is struggling with her depression currently.  She is eating more than normal.  She was able to try a new pot roast recipe and then made vegetable stew with leftovers. She is trying to do better overall. Ernesha will be talked to her doctor in the next couple of weeks regarding her depression. She goes through "waves" which  triggers different cylces of eating. She has been working on drinking more water.      Expected Outcome Short: Continue to work on healthier options for meals and away from snacking.  Long: Continue to focus on heart healthy diet. Short: Continue goals established by RD Long: Maintain heart healthy diet             Nutrition Goals Discharge (Final Nutrition Goals Re-Evaluation):  Nutrition Goals Re-Evaluation - 06/08/20 1511      Goals   Nutrition Goal ST: when making pot roast, make extra tray of non starcy vegetbales husband may  not like, cook 1 extra time during the week like omelette with vegetables, whole wheat toast, fruit LT: lower saturated fat, limit red meat and processed week, fruit and vegetable intake 8x/day    Comment Evolet will be talked to her doctor in the next couple of weeks regarding her depression. She goes through "waves" which triggers different cylces of eating. She has been working on drinking more water.    Expected Outcome Short: Continue goals established by RD Long: Maintain heart healthy diet           Psychosocial: Target Goals: Acknowledge presence or absence of significant depression and/or stress, maximize coping skills, provide positive support system. Participant is able to verbalize types and ability to use techniques and skills needed for reducing stress and depression.   Education: Stress, Anxiety, and Depression - Group verbal and visual presentation to define topics covered.  Reviews how body is impacted by stress, anxiety, and depression.  Also discusses healthy ways to reduce stress and to treat/manage anxiety and depression.  Written material given at graduation. Flowsheet Row Cardiac Rehab from 05/11/2020 in [facility]  Date 05/11/20  Educator [facility]  Instruction Review Code 1- [employer] Understanding      Education: Sleep Hygiene -Provides group verbal and written instruction about how sleep can affect your health.  Define sleep hygiene, discuss sleep cycles and impact of sleep habits. Review good sleep hygiene tips.    Initial Review & Psychosocial Screening:  Initial Psych Review & Screening - 03/21/20 1344      Initial Review   Current issues with Current Psychotropic Meds;Current Anxiety/Panic      Family Dynamics   Good Support System? Yes    Comments She sometimes gets anxiety with the heart issues she has gone through. She can look to her husband and her four puppies.      Barriers   Psychosocial barriers to participate in program The  patient should benefit from training in stress management and relaxation.      Screening Interventions   Interventions Encouraged to exercise;To provide support and resources with identified psychosocial needs;Provide feedback about the scores to participant    Expected Outcomes Short Term goal: Utilizing psychosocial counselor, staff and physician to assist with identification of specific Stressors or current issues interfering with healing process. Setting desired goal for each stressor or current issue identified.;Long Term Goal: Stressors or current issues are controlled or eliminated.;Short Term goal: Identification and review with participant of any Quality of Life or Depression concerns found by scoring the questionnaire.;Long Term goal: The participant improves quality of Life and PHQ9 Scores as seen by post scores and/or verbalization of changes           Quality of Life Scores:   Quality of Life - 06/30/20 1325      Quality of Life   Select Quality of Life  Quality of Life Scores   Health/Function Pre 23.18 %    Health/Function Post 23.7 %    Health/Function % Change 2.24 %    Socioeconomic Pre 28.75 %    Socioeconomic Post 21.67 %    Socioeconomic % Change  -24.63 %    Psych/Spiritual Pre 24.5 %    Psych/Spiritual Post 23.14 %    Psych/Spiritual % Change -5.55 %    Family Pre 27.6 %    Family Post 25.2 %    Family % Change -8.7 %    GLOBAL Pre 24.97 %    GLOBAL Post 23.44 %    GLOBAL % Change -6.13 %          Scores of 19 and below usually indicate a poorer quality of life in these areas.  A difference of  2-3 points is a clinically meaningful difference.  A difference of 2-3 points in the total score of the Quality of Life Index has been associated with significant improvement in overall quality of life, self-image, physical symptoms, and general health in studies assessing change in quality of life.  PHQ-9: Recent Review Flowsheet Data    Depression screen [facility]  2/9 06/30/2020 06/09/2020 05/12/2020 04/18/2020 03/22/2020   Decreased Interest _0 Down, Depressed, Hopeless 1 0 _1 PHQ - 2 Score _2 Altered sleeping _3 Tired, decreased energy _4 Change in appetite _5 Feeling bad or failure about yourself  0 0 _6 Trouble concentrating 1 0 _7 Moving slowly or fidgety/restless 0 0 0 0 0   Suicidal thoughts 0 0 0 0 0   PHQ-9 Score _8 Difficult doing work/chores Somewhat difficult Somewhat difficult Somewhat difficult Not difficult at all Not difficult at all     Interpretation of Total Score  Total Score Depression Severity:  1-4 = Minimal depression, 5-9 = Mild depression, 10-14 = Moderate depression, 15-19 = Moderately severe depression, 20-27 = Severe depression   Psychosocial Evaluation and Intervention:  Psychosocial Evaluation - 03/21/20 1346      Psychosocial Evaluation & Interventions   Interventions Encouraged to exercise with the program and follow exercise prescription;Stress management education;Relaxation education    Comments She sometimes gets anxiety with the heart issues she has gone through. She can look to her husband and her four puppies.    Expected Outcomes Short: Exercise regularly to support mental health and notify staff of any changes. Long: maintain mental health and well being through teaching of rehab or prescribed medications independently.    Continue Psychosocial Services  Follow up required by staff           Psychosocial Re-Evaluation:  Psychosocial Re-Evaluation    [place] Name 04/18/20 1408 05/12/20 1344 06/08/20 1357 06/09/20 1336       Psychosocial Re-Evaluation   Current issues with Current Sleep Concerns;History of Depression Current Sleep Concerns;Current Psychotropic Meds;Current Depression;Current Anxiety/Panic Current Sleep Concerns;Current Psychotropic Meds;Current Depression;Current Anxiety/Panic Current Depression    Comments Rogina is doing  well mentally. She reports she has been having bad dreams at night which interupts her sleep.Though, she can go back to sleep pretty soon after she wakes up. Talked about doing some type of meditation or deep breathing before bed and she already prays before she goes  to sleep which calms her mind down. She does have good suport from family. She is currently taking medication for depression and anxiety and she feels comfortable with where she is at. Denied wanting to see a therapist at this time. Her PHQ did go up 1 point, staff will continue to monitor. Morenike is in a little funk currently.  Her PHQ has crept up some over the past 3 weeks and she can feel the effects of it.  She has been sick.  Her husband has noticed too and is trying to pull her out too.  She is already on a high dose anti-depressant so she was encouraged to call her doctor's office to see if they can help her with a booster of meds or a change.  She is determined to make a change.  She has not been sleeping well recently and then wants to sleep all day in her chair. She has noted that her anxiety has gotten a little worse recently.  She did sit in on stress class yesterday. Amira's sleep has been much better as she feels less anxiety. However,  she reports feeling "waves of depression" where some times she feels good and some time shes bad. She is already on the high dose od anti-depressant and stressed the important to talked to her doctor to look at other options and/or look at changing different medications, possibly introduce talk therapy. Her appt with her doctor is in a couple of weeks. Talked about taking walks at home to relieve some stress as she is not doing much exercise at home right now. Correlation of exercise nad mood boosting can help. Patient states she is going to try to incorportate that in her lifestyle. PHQ has improved from 12 to a 7.  She is feeling better overall, see previous note.    Expected Outcomes Short: Talk to  doctor aobut night terrors Long: Utilize exercise to help manage stress and maintain positive attitude Short: Talk to doctor about depression symptoms and anxiety Long: Continue to manage depression positively. Short: Address depression issues with doctor Long: Maintain positive attitude Short: Address depression issues with doctor Long: Maintain positive attitude    Interventions Encouraged to attend Cardiac Rehabilitation for the exercise Encouraged to attend Cardiac Rehabilitation for the exercise;Stress management education Encouraged to attend Cardiac Rehabilitation for the exercise;Stress management education Encouraged to attend Cardiac Rehabilitation for the exercise;Stress management education    Continue Psychosocial Services  Follow up required by staff Follow up required by staff Follow up required by staff Follow up required by staff           Psychosocial Discharge (Final Psychosocial Re-Evaluation):  Psychosocial Re-Evaluation - 06/09/20 1336      Psychosocial Re-Evaluation   Current issues with Current Depression    Comments PHQ has improved from 12 to a 7.  She is feeling better overall, see previous note.    Expected Outcomes Short: Address depression issues with doctor Long: Maintain positive attitude    Interventions Encouraged to attend Cardiac Rehabilitation for the exercise;Stress management education    Continue Psychosocial Services  Follow up required by staff           Vocational Rehabilitation: Provide vocational rehab assistance to qualifying candidates.   Vocational Rehab Evaluation & Intervention:   Education: Education Goals: Education classes will be provided on a variety of topics geared toward better understanding of heart health and risk factor modification. Participant will state understanding/return demonstration of topics presented as noted by  education test scores.  Learning Barriers/Preferences:  Learning Barriers/Preferences - 03/21/20 1342       Learning Barriers/Preferences   Learning Barriers None    Learning Preferences None           General Cardiac Education Topics:  AED/CPR: - Group verbal and written instruction with the use of models to demonstrate the basic use of the AED with the basic ABC's of resuscitation.   Anatomy and Cardiac Procedures: - Group verbal and visual presentation and models provide information about basic cardiac anatomy and function. Reviews the testing methods done to diagnose heart disease and the outcomes of the test results. Describes the treatment choices: Medical Management, Angioplasty, or Coronary Bypass Surgery for treating various heart conditions including Myocardial Infarction, Angina, Valve Disease, and Cardiac Arrhythmias.  Written material given at graduation. Flowsheet Row Cardiac Rehab from 05/11/2020 in [facility] Cardiac and Pulmonary Rehab  Date 03/30/20  Educator [facility]  Instruction Review Code 1- Verbalizes Understanding      Medication Safety: - Group verbal and visual instruction to review commonly prescribed medications for heart and lung disease. Reviews the medication, class of the drug, and side effects. Includes the steps to properly store meds and maintain the prescription regimen.  Written material given at graduation.   Intimacy: - Group verbal instruction through game format to discuss how heart and lung disease can affect sexual intimacy. Written material given at graduation..   Know Your Numbers and Heart Failure: - Group verbal and visual instruction to discuss disease risk factors for cardiac and pulmonary disease and treatment options.  Reviews associated critical values for Overweight/Obesity, Hypertension, Cholesterol, and Diabetes.  Discusses basics of heart failure: signs/symptoms and treatments.  Introduces Heart Failure Zone chart for action plan for heart failure.  Written material given at graduation.   Infection Prevention: - Provides verbal and written  material to individual with discussion of infection control including proper hand washing and proper equipment cleaning during exercise session. Flowsheet Row Cardiac Rehab from 05/11/2020 in [facility] Cardiac and Pulmonary Rehab  Date 03/22/20  Educator [facility]  Instruction Review Code 1- Verbalizes Understanding      Falls Prevention: - Provides verbal and written material to individual with discussion of falls prevention and safety. Flowsheet Row Cardiac Rehab from 05/11/2020 in [facility] Cardiac and Pulmonary Rehab  Date 03/22/20  Educator AS  Instruction Review Code 1- Verbalizes Understanding      Other: -Provides group and verbal instruction on various topics (see comments)   Knowledge Questionnaire Score:  Knowledge Questionnaire Score - 06/30/20 1325      Knowledge Questionnaire Score   Post Score 20/26           Core Components/Risk Factors/Patient Goals at Admission:  Personal Goals and Risk Factors at Admission - 03/22/20 1517      Core Components/Risk Factors/Patient Goals on Admission    Weight Management Yes;Weight Loss    Intervention Weight Management: Develop a combined nutrition and exercise program designed to reach desired caloric intake, while maintaining appropriate intake of nutrient and fiber, sodium and fats, and appropriate energy expenditure required for the weight goal.;Weight Management: Provide education and appropriate resources to help participant work on and attain dietary goals.;Weight Management/Obesity: Establish reasonable short term and long term weight goals.;Obesity: Provide education and appropriate resources to help participant work on and attain dietary goals.    Expected Outcomes Short Term: Continue to assess and modify interventions until short term weight is achieved;Long Term: Adherence to nutrition and physical activity/exercise program  aimed toward attainment of established weight goal;Weight Loss: Understanding of general recommendations for a  balanced deficit meal plan, which promotes 1-2 lb weight loss per week and includes a negative energy balance of [phone] kcal/d;Understanding recommendations for meals to include 15-35% energy as protein, 25-35% energy from fat, 35-60% energy from carbohydrates, less than 247m of dietary cholesterol, 20-35 gm of total fiber daily;Understanding of distribution of calorie intake throughout the day with the consumption of 4-5 meals/snacks    Diabetes Yes    Intervention Provide education about signs/symptoms and action to take for hypo/hyperglycemia.;Provide education about proper nutrition, including hydration, and aerobic/resistive exercise prescription along with prescribed medications to achieve blood glucose in normal ranges: Fasting glucose 65-99 mg/dL    Expected Outcomes Short Term: Participant verbalizes understanding of the signs/symptoms and immediate care of hyper/hypoglycemia, proper foot care and importance of medication, aerobic/resistive exercise and nutrition plan for blood glucose control.;Long Term: Attainment of HbA1C < 7%.    Hypertension Yes    Intervention Provide education on lifestyle modifcations including regular physical activity/exercise, weight management, moderate sodium restriction and increased consumption of fresh fruit, vegetables, and low fat dairy, alcohol moderation, and smoking cessation.;Monitor prescription use compliance.    Expected Outcomes Short Term: Continued assessment and intervention until BP is < 140/928mHG in hypertensive participants. < 130/8040mG in hypertensive participants with diabetes, heart failure or chronic kidney disease.;Long Term: Maintenance of blood pressure at goal levels.    Lipids Yes    Intervention Provide education and support for participant on nutrition & aerobic/resistive exercise along with prescribed medications to achieve LDL <56m84mDL >40mg16m Expected Outcomes Long Term: Cholesterol controlled with medications as prescribed,  with individualized exercise RX and with personalized nutrition plan. Value goals: LDL < 56mg,79m > 40 mg.;Short Term: Participant states understanding of desired cholesterol values and is compliant with medications prescribed. Participant is following exercise prescription and nutrition guidelines.           Education:Diabetes - Individual verbal and written instruction to review signs/symptoms of diabetes, desired ranges of glucose level fasting, after meals and with exercise. Acknowledge that pre and post exercise glucose checks will be done for 3 sessions at entry of program. [place]  Date 03/21/20  [facility]- Verbalizes Understanding      Core Components/Risk Factors/Patient Goals Review:   Goals and Risk Factor Review    [place] Name 04/18/20 1403 05/12/20 1405 06/08/20 1352         Core Components/Risk Factors/Patient Goals Review   Personal Goals Review Weight Management/Obesity;Hypertension;Diabetes Weight Management/Obesity;Hypertension;Diabetes Weight Management/Obesity;Hypertension;Diabetes     Review Auda Emileaing well. She checks her sugar once daily, usually in the morning and it ranges around 130s. She is aware to contact her doctor if she starts getting abnormal numbers. She also checks her BP at home, reports systolic around 130s [id number] [id number] compliant with taking BP at home and checks it several times per week. Stays compliants with all her medications. At this time Julieth Meleanaot trying to lose weight but thinks she needs to. She is meeting with the  RD after class today and looking foward to learning more. She did lose 30 lbs when she had COVID, but gained it right back. Doesn't weigh herself at home very often and encouraged her to maintain a log. Chany Hawraaained 4 lbs in the last week with  her extra eating from depression.  We talked about the importance of  calling her doctor.  She has not been checking her sugars as she has not felt like it and doesn't really care right now.  Her pressures have been doing well in rehab and she has not really worried about them as much.  She thinks that it is reading her high so we talked about bringing it into class to check against what we are hearing. Janaria doesn't check blood sugars periodically but several times per week- states doctor says OK as long as they stay consistent around 130 in the morning. She knows to report any abnormal values. She has an endo appointment in the next couple of weeks where she will get a new A1C, last time it was 7% and she hopes it goes down. Weight has been staying stable and she plans to talk to her doctor at her appointment in a couple weeks to address her depression. BP have been good in rehab, a couple are elevated but she contributes it to her stress/anxiety. She does not weigh herself at home but will resume when she graduates from the program.     Expected Outcomes Short: Weigh herself at home more consistently Long: Continue to manage lifestyle risk factors Short: Call her doctor about her depression Long: Continue to monitor risk factors. Short: Continue to manage sugars and weight at home Long: Continue to manage lifestyle risk factors            Core Components/Risk Factors/Patient Goals at Discharge (Final Review):   Goals and Risk Factor Review - 06/08/20 1352      Core Components/Risk Factors/Patient Goals Review   Personal Goals Review Weight Management/Obesity;Hypertension;Diabetes    Review Hailie doesn't check blood sugars periodically but several times per week- states doctor says OK as long as they stay consistent around 130 in the morning. She knows to report any abnormal values. She has an endo appointment in the next couple of weeks where she will get a new A1C, last time it was 7% and she hopes it goes down. Weight has been staying stable and she plans to talk to  her doctor at her appointment in a couple weeks to address her depression. BP have been good in rehab, a couple are elevated but she contributes it to her stress/anxiety. She does not weigh herself at home but will resume when she graduates from the program.    Expected Outcomes Short: Continue to manage sugars and weight at home Long: Continue to manage lifestyle risk factors           ITP Comments:  ITP Comments    Row Name 03/21/20 1342 03/22/20 1526 03/28/20 1336 04/06/20 0520 05/04/20 0844   ITP Comments Virtual Visit completed. Patient informed on EP and RD appointment and 6 Minute walk test. Patient also informed of patient health questionnaires on My Chart. Patient Verbalizes understanding. Visit diagnosis can be found in CHL12/10/2019. Completed 6MWT and gym orientation. Initial ITP created and sent for review to Dr. Emily Filbert, Medical Director. First full day of exercise!  Patient was oriented to gym and equipment including functions, settings, policies, and procedures.  Patient's individual exercise prescription and treatment plan were reviewed.  All starting workloads were established based on the results of the 6 minute walk test done at initial orientation visit.  The plan for exercise progression was also introduced and progression will be customized based on patient's performance and goals. 30 Day review completed.  Medical Director ITP review done, changes made as directed, and signed approval by Medical Director. 30 Day review completed. Medical Director ITP review done, changes made as directed, and signed approval by Medical Director.   [place] Name 06/01/20 0606 06/29/20 0707 06/30/20 1336       ITP Comments 30 Day review completed. Medical Director ITP review done, changes made as directed, and signed approval by Medical Director. 30 Day review completed. Medical Director ITP review done, changes made as directed, and signed approval by Medical Director. Suzzanne graduated today from   rehab with 36 sessions completed.  Details of the patient's exercise prescription and what She needs to do in order to continue the prescription and progress were discussed with patient.  Patient was given a copy of prescription and goals.  Patient verbalized understanding.  Artemis plans to continue to exercise by doing staff videos and walking.            Comments: Discharge ITP

## 2020-06-30 NOTE — Progress Notes (Signed)
Daily Session Note  Patient Details  Name: Amanda Wiley MRN: [id number] Date of Birth: 03-21-1956 Referring Provider:   Flowsheet Row Cardiac Rehab from 03/22/2020 in [facility]  Referring Provider Fath      Encounter Date: 06/30/2020  Check In:  Session Check In - 06/30/20 1325      Check-In   Supervising physician immediately available to respond to emergencies See telemetry face sheet for immediately available ER MD    Location [facility]Cardiac [facility] Pulmonary [facility]    Staff Present Renita Papa, RN BSN;Joseph [address] [place], [place], [place], CCRP, CCET    Virtual Visit No    Medication changes reported     No    Fall or balance concerns reported    No    Warm-up and Cool-down Performed on first and last piece of equipment    Resistance Training Performed Yes    VAD Patient? No    PAD/SET Patient? No      Pain Assessment   Currently in Pain? No/denies              Social History   Tobacco Use  Smoking Status Former Smoker  . Packs/day: 2.00  . Years: 15.00  . Pack years: 30.00  . Types: Cigarettes  . Quit date: 04/09/1997  . Years since quitting: 23.2  Smokeless Tobacco Never Used    Goals Met:  Independence with exercise equipment Exercise tolerated well No report of cardiac concerns or symptoms Strength training completed today  Goals Unmet:  Not Applicable  Comments:  [name] graduated today from  rehab with 36 sessions completed.  Details of the patient's exercise prescription and what She needs to do in order to continue the prescription and progress were discussed with patient.  Patient was given a copy of prescription and goals.  Patient verbalized understanding.  [name] plans to continue to exercise by doing staff videos and walking.    Dr. Emily Filbert is Medical Director for [facility].

## 2020-07-27 ENCOUNTER — Telehealth: Payer: Self-pay | Admitting: Internal Medicine

## 2020-07-27 MED ORDER — RABEPRAZOLE SODIUM 20 MG PO TBEC: 20.0000 mg | DELAYED_RELEASE_TABLET | Freq: Two times a day (BID) | ORAL | 1 refills | Status: DC

## 2020-07-27 MED ORDER — FLUTICASONE-SALMETEROL 230-21 MCG/ACT IN AERO: 2.0000 | INHALATION_SPRAY | Freq: Two times a day (BID) | RESPIRATORY_TRACT | 1 refills | Status: AC

## 2020-07-27 MED ORDER — METOPROLOL TARTRATE 25 MG PO TABS: 12.5000 mg | ORAL_TABLET | Freq: Two times a day (BID) | ORAL | 0 refills | Status: DC

## 2020-07-27 MED ORDER — BUPROPION HCL ER (XL) 300 MG PO TB24: 300.0000 mg | ORAL_TABLET | Freq: Every day | ORAL | 1 refills | Status: DC

## 2020-07-27 NOTE — Telephone Encounter (Signed)
PT called to get meds: fluticasone-salmeterol (ADVAIR HFA) 230-21 MCG/ACT inhaler, buPROPion (WELLBUTRIN XL) 300 MG 24 hr tablet, metoprolol tartrate (LOPRESSOR) 25 MG tablet, RABEprazole (ACIPHEX) 20 MG tablet, metoprolol tartrate (LOPRESSOR) 25 MG tablet. To be sent to [facility].

## 2020-07-29 ENCOUNTER — Other Ambulatory Visit: Payer: Self-pay

## 2020-07-29 MED ORDER — METFORMIN HCL 500 MG PO TABS: 2.0000 | ORAL_TABLET | Freq: Two times a day (BID) | ORAL | 2 refills | Status: DC

## 2020-07-29 NOTE — Telephone Encounter (Signed)
I am ok to send in metformin.  Please clarify if still taking 549m 2 tablets bid.  Does she need printed script or [facility]

## 2020-07-29 NOTE — Telephone Encounter (Signed)
Patient called and said her Metform was not called in with her other medications to [facility]. Patient is traveling there on Tuesday to pick up and is asking for this to be called in today since they are closed on Saturday and Sunday.

## 2020-07-29 NOTE — Telephone Encounter (Signed)
Dose clarified. Rx sent in

## 2020-07-29 NOTE — Telephone Encounter (Signed)
Medication is not in current medication list. According to last office visit it does state that pt has resumed Metformin but it looks like pt is followed by Dr. Gabriel Carina for diabetes. Spoke with pt to reach out to their office for a refill and pt stated that Dr. Nicki Reaper has always filled this medication and would like to keep it that way.

## 2020-08-04 ENCOUNTER — Telehealth: Payer: Self-pay | Admitting: Internal Medicine

## 2020-08-04 ENCOUNTER — Other Ambulatory Visit: Payer: Self-pay

## 2020-08-04 MED ORDER — METOPROLOL TARTRATE 25 MG PO TABS: 25.0000 mg | ORAL_TABLET | Freq: Two times a day (BID) | ORAL | 0 refills | Status: DC

## 2020-08-04 NOTE — Telephone Encounter (Signed)
Patient stated that she is taking metoprolol 25 mg bid. She has been on this dose for years. Corrected rx and sent in new rx. Pt is aware.

## 2020-08-04 NOTE — Telephone Encounter (Signed)
PT called in regarding her metoprolol tartrate (LOPRESSOR) 25 MG tablet wanting some clarification on how she is suppose to take the medicine as the instructions are unclear. Would like a callback.

## 2020-08-05 ENCOUNTER — Other Ambulatory Visit: Payer: Self-pay

## 2020-08-05 ENCOUNTER — Ambulatory Visit
Admission: RE | Admit: 2020-08-05 | Discharge: 2020-08-05 | Disposition: A | Discharge status: Home / Self Care | Payer: Medicare Other | Source: Ambulatory Visit | Attending: General Surgery | Admitting: General Surgery

## 2020-08-05 DIAGNOSIS — Z1231 Encounter for screening mammogram for malignant neoplasm of breast: Secondary | ICD-10-CM | POA: Insufficient documentation

## 2020-08-12 ENCOUNTER — Encounter: Payer: Self-pay | Admitting: Internal Medicine

## 2020-08-12 ENCOUNTER — Ambulatory Visit (INDEPENDENT_AMBULATORY_CARE_PROVIDER_SITE_OTHER): Payer: Medicare Other | Admitting: Internal Medicine

## 2020-08-12 ENCOUNTER — Other Ambulatory Visit: Payer: Self-pay

## 2020-08-12 VITALS — BP 126/68 | HR 74 | Temp 97.8°F | Ht 65.51 in | Wt 221.2 lb

## 2020-08-12 DIAGNOSIS — D45 Polycythemia vera: Secondary | ICD-10-CM

## 2020-08-12 DIAGNOSIS — K5090 Crohn's disease, unspecified, without complications: Secondary | ICD-10-CM

## 2020-08-12 DIAGNOSIS — J4520 Mild intermittent asthma, uncomplicated: Secondary | ICD-10-CM

## 2020-08-12 DIAGNOSIS — I152 Hypertension secondary to endocrine disorders: Secondary | ICD-10-CM

## 2020-08-12 DIAGNOSIS — E1169 Type 2 diabetes mellitus with other specified complication: Secondary | ICD-10-CM | POA: Diagnosis not present

## 2020-08-12 DIAGNOSIS — I25119 Atherosclerotic heart disease of native coronary artery with unspecified angina pectoris: Secondary | ICD-10-CM

## 2020-08-12 DIAGNOSIS — Z23 Encounter for immunization: Secondary | ICD-10-CM

## 2020-08-12 DIAGNOSIS — R160 Hepatomegaly, not elsewhere classified: Secondary | ICD-10-CM

## 2020-08-12 DIAGNOSIS — I10 Essential (primary) hypertension: Secondary | ICD-10-CM

## 2020-08-12 DIAGNOSIS — Z794 Long term (current) use of insulin: Secondary | ICD-10-CM

## 2020-08-12 DIAGNOSIS — E7800 Pure hypercholesterolemia, unspecified: Secondary | ICD-10-CM

## 2020-08-12 DIAGNOSIS — F320 Major depressive disorder, single episode, mild: Secondary | ICD-10-CM

## 2020-08-12 DIAGNOSIS — E669 Obesity, unspecified: Secondary | ICD-10-CM

## 2020-08-12 DIAGNOSIS — F32A Depression, unspecified: Secondary | ICD-10-CM

## 2020-08-12 DIAGNOSIS — K219 Gastro-esophageal reflux disease without esophagitis: Secondary | ICD-10-CM

## 2020-08-12 DIAGNOSIS — R21 Rash and other nonspecific skin eruption: Secondary | ICD-10-CM

## 2020-08-12 DIAGNOSIS — E1159 Type 2 diabetes mellitus with other circulatory complications: Secondary | ICD-10-CM

## 2020-08-12 MED ORDER — CLOTRIMAZOLE-BETAMETHASONE 1-0.05 % EX CREA: 1.0000 "application " | TOPICAL_CREAM | Freq: Two times a day (BID) | CUTANEOUS | 0 refills | Status: DC

## 2020-08-12 MED ORDER — FLUCONAZOLE 150 MG PO TABS: ORAL_TABLET | ORAL | 0 refills | Status: DC

## 2020-08-12 NOTE — Progress Notes (Signed)
[name], female   DOB: 06/01/1955, 65 y.o.   MRN: [id number]   Subjective:    [name], female    DOB: [birth year], 65 y.o.   MRN: [id number]  HPI This visit occurred during the SARS-CoV-2 public health emergency.  Safety protocols were in place, including screening questions prior to the visit, additional usage of staff PPE, and extensive cleaning of exam room while observing appropriate contact time as indicated for disinfecting solutions.  Patient here for a scheduled follow up.  Here to follow up regarding her blood pressure, diabetes and cholesterol.  Also follow up regarding recent MI.  Admitted 03/05/20 with NSTEMI. S/p PCI to LAD. Continues on aspirin and plavix.  Going to cardiac rehab. Has experienced no further chest pain like she experienced with MI.  Some increased stress and feels symptoms related to this.  Has discussed with cardiology.  Overall appears to be handling stress relatively well.  Will notify me if feels needs further intervention.  Breathing stable.  No increased cough or congestion.  Has not needed rescue inhaler. No acid reflux or abdominal or cramping.  Bowels moving.  Circular rash - left ankle and right top of foot.  Seeing endocrinology. Last a1c 7.5.  Adjusting medication.  Discussed diet and exercise.  States am sugars 160s.     Past Medical History:  Diagnosis Date  . Arthritis   . Asthma   . Complication of anesthesia    HARD TO WAKE UP  . COPD (chronic obstructive pulmonary disease) ([place])   . Crohn's disease ([place])   . Depression   . Diabetes mellitus ([place])   . Fracture, foot    post fall  . Hypercholesterolemia   . Hypertension   . IBS (irritable bowel syndrome)   . Nephrolithiasis    s/p lithotripsy [facility])  . Vitamin D deficiency    Past Surgical History:  Procedure Laterality Date  . ABDOMINAL HYSTERECTOMY  1985  . APPENDECTOMY  1985  . BREAST DUCTAL SYSTEM EXCISION Left 09/04/2019    Procedure: EXCISION DUCTAL SYSTEM BREAST;  Surgeon: Robert Bellow, MD;  Location: [facility] ORS;  Service: General;  Laterality: Left;  . BREAST EXCISIONAL BIOPSY Right 2013   benign  . COLONOSCOPY  2014  . COLONOSCOPY WITH PROPOFOL N/A 02/10/2020   Procedure: COLONOSCOPY WITH PROPOFOL;  Surgeon: Robert Bellow, MD;  Location: [facility] ENDOSCOPY;  Service: Endoscopy;  Laterality: N/A;  . CORONARY STENT INTERVENTION N/A 03/07/2020   Procedure: CORONARY STENT INTERVENTION;  Surgeon: Isaias Cowman, MD;  Location: [place] CV LAB;  Service: Cardiovascular;  Laterality: N/A;  . EXCISION / BIOPSY BREAST / NIPPLE / DUCT Left 2019   papilloma  . EXCISION OF BREAST BIOPSY Left 2019   papilloma  . LAPAROSCOPIC CHOLECYSTECTOMY  2005   Dr Tamala Julian  . LAPAROSCOPIC SUPRACERVICAL HYSTERECTOMY  1985   left ovary not removed  . LEFT HEART CATH AND CORONARY ANGIOGRAPHY N/A 03/07/2020   Procedure: LEFT HEART CATH AND CORONARY ANGIOGRAPHY;  Surgeon: Isaias Cowman, MD;  Location: [place] [facility];  Service: Cardiovascular;  Laterality: N/A;  . LEFT HEART CATH AND CORONARY ANGIOGRAPHY N/A 03/10/2020   Procedure: LEFT HEART CATH AND CORONARY ANGIOGRAPHY and possible PCI and stent;  Surgeon: Yolonda Kida, MD;  Location: [place] CV LAB;  Service: Cardiovascular;  Laterality: N/A;  . LITHOTRIPSY     x7  . OOPHORECTOMY    . POLYPECTOMY    . SHOULDER SURGERY  right (pinning)  . SHOULDER SURGERY     left (pinning)  . TONSILLECTOMY  1966   Family History  Problem Relation Age of Onset  . Heart disease Father   . Emphysema Maternal Grandmother   . COPD Mother   . Hypercholesterolemia Mother   . Emphysema Mother   . Hypertension Brother   . Emphysema Other        grandmother  . COPD Sister   . Crohn's disease Sister   . Hypertension Sister   . Breast cancer Neg Hx   . Colon cancer Neg Hx    Social History   Socioeconomic History  . Marital status: Married    Spouse  name: Not on file  . Number of children: 2  . Years of education: Not on file  . Highest education level: Not on file  Occupational History    Employer: [employer]  Tobacco Use  . Smoking status: Former Smoker    Packs/day: 2.00    Years: 15.00    Pack years: 30.00    Types: Cigarettes    Quit date: 04/09/1997    Years since quitting: 23.3  . Smokeless tobacco: Never Used  Vaping Use  . Vaping Use: Never used  Substance and Sexual Activity  . Alcohol use: Never    Alcohol/week: 0.0 standard drinks  . Drug use: No  . Sexual activity: Not on file  Other Topics Concern  . Not on file  Social History Narrative  . Not on file   Social Determinants of Health   Financial Resource Strain: Not on file  Food Insecurity: Not on file  Transportation Needs: Not on file  Physical Activity: Not on file  Stress: Not on file  Social Connections: Not on file    Outpatient Encounter Medications as of 08/12/2020  Medication Sig  . acetaminophen (TYLENOL) 325 MG tablet Take 2 tablets (650 mg total) by mouth every 6 (six) hours as needed for mild pain (or Fever >/= 101).  Marland Kitchen albuterol (PROVENTIL) (2.5 MG/3ML) 0.083% nebulizer solution Take 2.5 mg by nebulization every 6 (six) hours as needed for wheezing.  Marland Kitchen amLODipine (NORVASC) 5 MG tablet Take 1 tablet by mouth daily.  Marland Kitchen aspirin 81 MG chewable tablet Chew 1 tablet by mouth daily.  Marland Kitchen buPROPion (WELLBUTRIN XL) 300 MG 24 hr tablet Take 1 tablet (300 mg total) by mouth daily.  . clotrimazole-betamethasone (LOTRISONE) cream Apply 1 application topically 2 (two) times daily.  . Dulaglutide (TRULICITY) 1.5 GP/4.9IY SOPN Inject 1.5 mg into the skin once a week. ON SUNDAYS  . fluconazole (DIFLUCAN) 150 MG tablet Take one tablet x 1 day and then may repeat x 1 in three days if not improved.  . fluticasone-salmeterol (ADVAIR HFA) 230-21 MCG/ACT inhaler Inhale 2 puffs into the lungs 2 (two) times daily.  Marland Kitchen FREESTYLE LITE test strip   . hyoscyamine  (LEVSIN SL) 0.125 MG SL tablet Place 0.125 mg under the tongue as needed.  . insulin glargine (LANTUS) 100 UNIT/ML injection Inject 20 Units into the skin at bedtime.   Marland Kitchen ipratropium (ATROVENT) 0.06 % nasal spray Place 2 sprays into both nostrils 3 (three) times daily as needed for rhinitis.  . Ipratropium-Albuterol (COMBIVENT) 20-100 MCG/ACT AERS respimat Inhale 2 puffs into the lungs every 4 (four) hours while awake. (Patient taking differently: Inhale 1 puff into the lungs 2 (two) times daily. And as needed as a rescue inhaler.)  . isosorbide mononitrate (IMDUR) 30 MG 24 hr tablet Take 1 tablet (  30 mg total) by mouth daily.  Marland Kitchen JARDIANCE 25 MG TABS tablet Take 25 mg by mouth daily.  Marland Kitchen LORazepam (ATIVAN) 0.5 MG tablet   . losartan (COZAAR) 25 MG tablet Take 1 tablet (25 mg total) by mouth daily.  . metFORMIN (GLUCOPHAGE) 500 MG tablet Take 2 tablets (1,000 mg total) by mouth 2 (two) times daily.  . metoprolol tartrate (LOPRESSOR) 25 MG tablet Take 1 tablet (25 mg total) by mouth 2 (two) times daily.  . nitroGLYCERIN (NITROSTAT) 0.4 MG SL tablet Place 1 tablet (0.4 mg total) under the tongue every 5 (five) minutes as needed for chest pain.  . prasugrel (EFFIENT) 10 MG TABS tablet Take 1 tablet (10 mg total) by mouth daily.  . RABEprazole (ACIPHEX) 20 MG tablet Take 1 tablet (20 mg total) by mouth in the morning and at bedtime.  . rosuvastatin (CRESTOR) 5 MG tablet Take 1 tablet (5 mg total) by mouth daily.  Marland Kitchen ULTRACARE PEN NEEDLES 32G X 4 MM MISC   . vitamin C (VITAMIN C) 1000 MG tablet Take 1 tablet (1,000 mg total) by mouth daily.  . [DISCONTINUED] amLODipine (NORVASC) 5 MG tablet Take 1 tablet (5 mg total) by mouth daily. (Patient not taking: Reported on 08/12/2020)  . [DISCONTINUED] aspirin 81 MG chewable tablet Chew 1 tablet (81 mg total) by mouth daily. (Patient not taking: Reported on 08/12/2020)   No facility-administered encounter medications on file as of 08/12/2020.    Review of Systems   Constitutional: Negative for appetite change and unexpected weight change.  HENT: Negative for congestion and sinus pressure.   Respiratory: Negative for cough, chest tightness and shortness of breath.   Cardiovascular: Negative for palpitations and leg swelling.       No chest pain (like she experienced with MI).    Gastrointestinal: Negative for abdominal pain, diarrhea, nausea and vomiting.  Genitourinary: Negative for difficulty urinating and dysuria.  Musculoskeletal: Negative for joint swelling and myalgias.  Skin: Negative for color change and rash.  Neurological: Negative for dizziness, light-headedness and headaches.  Psychiatric/Behavioral: Negative for agitation and dysphoric mood.       Objective:    Physical Exam Vitals reviewed.  Constitutional:      General: She is not in acute distress.    Appearance: Normal appearance.  HENT:     Head: Normocephalic and atraumatic.     Right Ear: External ear normal.     Left Ear: External ear normal.  Eyes:     General: No scleral icterus.       Right eye: No discharge.        Left eye: No discharge.     Conjunctiva/sclera: Conjunctivae normal.  Neck:     Thyroid: No thyromegaly.  Cardiovascular:     Rate and Rhythm: Normal rate and regular rhythm.  Pulmonary:     Effort: No respiratory distress.     Breath sounds: Normal breath sounds. No wheezing.  Abdominal:     General: Bowel sounds are normal.     Palpations: Abdomen is soft.     Tenderness: There is no abdominal tenderness.  Musculoskeletal:        General: No swelling or tenderness.     Cervical back: Neck supple. No tenderness.  Lymphadenopathy:     Cervical: No cervical adenopathy.  Skin:    Findings: No erythema or rash.  Neurological:     Mental Status: She is alert.  Psychiatric:        Mood and Affect:  Mood normal.        Behavior: Behavior normal.     BP 126/68 (BP Location: Left Arm, Patient Position: Sitting)   Pulse 74   Temp 97.8 F (36.6  C)   Ht 5' 5.51" (1.664 m)   Wt 221 lb 3.2 oz (100.3 kg)   SpO2 94%   BMI 36.24 kg/m  Wt Readings from Last 3 Encounters:  08/12/20 221 lb 3.2 oz (100.3 kg)  03/22/20 219 lb 4.8 oz (99.5 kg)  03/14/20 219 lb 12.8 oz (99.7 kg)     Lab Results  Component Value Date   WBC 10.2 03/09/2020   HGB 14.3 03/09/2020   HCT 43.6 03/09/2020   PLT 178 03/09/2020   GLUCOSE 237 (H) 03/11/2020   CHOL 222 (H) 02/12/2020   TRIG 330.0 (H) 02/12/2020   HDL 40.50 02/12/2020   LDLDIRECT 149.0 02/12/2020   LDLCALC 148 (H) 07/24/2013   ALT 23 04/12/2020   AST 22 04/12/2020   NA 138 03/11/2020   K 4.1 03/11/2020   CL 101 03/11/2020   CREATININE 0.96 03/11/2020   BUN 25 (H) 03/11/2020   CO2 24 03/11/2020   TSH 2.15 06/02/2019   INR 0.9 03/06/2020   HGBA1C 7.7 (H) 02/12/2020   MICROALBUR 0.7 06/02/2019    MM 3D SCREEN BREAST BILATERAL  Result Date: 08/08/2020 CLINICAL DATA:  Screening. EXAM: DIGITAL SCREENING BILATERAL MAMMOGRAM WITH TOMOSYNTHESIS AND CAD TECHNIQUE: Bilateral screening digital craniocaudal and mediolateral oblique mammograms were obtained. Bilateral screening digital breast tomosynthesis was performed. The images were evaluated with computer-aided detection. COMPARISON:  Previous exam(s). ACR Breast Density Category b: There are scattered areas of fibroglandular density. FINDINGS: There are no findings suspicious for malignancy. The images were evaluated with computer-aided detection. IMPRESSION: No mammographic evidence of malignancy. A result letter of this screening mammogram will be mailed directly to the patient. RECOMMENDATION: Screening mammogram in one year. (Code:SM-B-01Y) BI-RADS CATEGORY  1: Negative. Electronically Signed   By: Lillia Mountain M.D.   On: 08/08/2020 09:46       Assessment & Plan:   Problem List Items Addressed This Visit    Asthma    Continue advair and singulair.  Has not needed rescue inhaler.  Follow.        CAD (coronary artery disease), native  coronary artery    S/p recent NSTEMI.  S/p PCI-LAD.  Continue effiant, aspirin, crestor and metoprolol.  Stable.  Has been attending cardiac rehab.  Follow.        Crohn's disease ([place])    Bowels stable.        Diabetes mellitus (HCC)    Recent a1c 7.5.  Followed by Dr Gabriel Carina.  Adjusting medication.  Low carb diet and exercise.  Follow met b and a1c.        Relevant Orders   Hemoglobin A1c   GERD (gastroesophageal reflux disease)    Continue aciphex. No upper symptoms reported.       Hepatomegaly    Was noted on ultrasound.  Discuss referral to GI.       Hypercholesterolemia without hypertriglyceridemia    Continue crestor.  Low cholesterol diet and exercise.  Follow lipid panel and liver function tests.        Relevant Orders   Hepatic function panel   Lipid panel   Hypertension    Continue metoprolol, imdur and amlodipine.  Blood pressure doing well.  Follow pressures.  Follow metabolic panel.       Relevant Orders  CBC with Differential/Platelet   TSH   Basic metabolic panel   Mild depression (HCC)    Continue wellbutrin.  Increased stress.  Discussed.  Does not feel needs any further intervention at this time.  Follow.        Relevant Medications   LORazepam (ATIVAN) 0.5 MG tablet   Obesity, diabetes, and hypertension syndrome (HCC)    Diet, exercise and weight loss.  Follow.        Polycythemia vera ([place])    Has seen hematology.  Follow cbc.       Relevant Medications   LORazepam (ATIVAN) 0.5 MG tablet   fluconazole (DIFLUCAN) 150 MG tablet   Rash    Circular rash noted on left ankle and right foot.  lotrisone cream as directed.  Follow.        Other Visit Diagnoses    Need for pneumococcal vaccination    -  Primary   Relevant Orders   Pneumococcal conjugate vaccine 20-valent (Prevnar 20) (Completed)       Einar Pheasant, MD

## 2020-08-13 ENCOUNTER — Encounter: Payer: Self-pay | Admitting: Internal Medicine

## 2020-08-13 ENCOUNTER — Telehealth: Payer: Self-pay | Admitting: Internal Medicine

## 2020-08-13 DIAGNOSIS — R21 Rash and other nonspecific skin eruption: Secondary | ICD-10-CM | POA: Insufficient documentation

## 2020-08-13 NOTE — Assessment & Plan Note (Signed)
Diet, exercise and weight loss.  Follow.

## 2020-08-13 NOTE — Assessment & Plan Note (Signed)
Continue aciphex. No upper symptoms reported.

## 2020-08-13 NOTE — Assessment & Plan Note (Addendum)
S/p recent NSTEMI.  S/p PCI-LAD.  Continue effiant, aspirin, crestor and metoprolol.  Stable.  Has been attending cardiac rehab.  Follow.

## 2020-08-13 NOTE — Assessment & Plan Note (Signed)
Has seen hematology.  Follow cbc.

## 2020-08-13 NOTE — Assessment & Plan Note (Signed)
Continue wellbutrin.  Increased stress.  Discussed.  Does not feel needs any further intervention at this time.  Follow.

## 2020-08-13 NOTE — Assessment & Plan Note (Signed)
Recent a1c 7.5.  Followed by Dr Gabriel Carina.  Adjusting medication.  Low carb diet and exercise.  Follow met b and a1c.

## 2020-08-13 NOTE — Assessment & Plan Note (Signed)
Continue crestor.  Low cholesterol diet and exercise.  Follow lipid panel and liver function tests.

## 2020-08-13 NOTE — Assessment & Plan Note (Signed)
Circular rash noted on left ankle and right foot.  lotrisone cream as directed.  Follow.

## 2020-08-13 NOTE — Assessment & Plan Note (Signed)
Continue advair and singulair.  Has not needed rescue inhaler.  Follow.

## 2020-08-13 NOTE — Telephone Encounter (Signed)
Please call pt and inform her that given her history of fatty liver (previously noted on ultrasound), I would like to repeat an ultrasound to confirm stable. (it is recommended to f/u to confirm no change).

## 2020-08-13 NOTE — Assessment & Plan Note (Signed)
Continue metoprolol, imdur and amlodipine.  Blood pressure doing well.  Follow pressures.  Follow metabolic panel.

## 2020-08-13 NOTE — Assessment & Plan Note (Signed)
Bowels stable.

## 2020-08-13 NOTE — Assessment & Plan Note (Addendum)
Was noted on ultrasound.  Discuss referral to GI.

## 2020-08-16 ENCOUNTER — Other Ambulatory Visit: Payer: Self-pay | Admitting: Internal Medicine

## 2020-08-16 DIAGNOSIS — K760 Fatty (change of) liver, not elsewhere classified: Secondary | ICD-10-CM

## 2020-08-16 NOTE — Progress Notes (Signed)
Order placed for abdominal ultrasound

## 2020-08-16 NOTE — Telephone Encounter (Signed)
Order placed for abdominal ultrasound.

## 2020-08-16 NOTE — Telephone Encounter (Signed)
Patient aware of below and is agreeable to do a repeat ultrasound.

## 2020-08-22 ENCOUNTER — Telehealth: Payer: Self-pay | Admitting: Internal Medicine

## 2020-08-22 NOTE — Telephone Encounter (Signed)
Pt will call back to sch not feeling.

## 2020-08-22 NOTE — Telephone Encounter (Signed)
Was this for the abdominal ultrasound.  Thanks.  (I was not sure how to tell).    Thanks  Dr Nicki Reaper

## 2020-08-22 NOTE — Telephone Encounter (Signed)
Yes US abdomen

## 2020-08-30 ENCOUNTER — Telehealth: Payer: Self-pay | Admitting: Internal Medicine

## 2020-08-30 NOTE — Telephone Encounter (Signed)
lft msg with family member to call ofc [place]. thanks

## 2020-08-30 NOTE — Telephone Encounter (Signed)
Patient would like to delay referral ultrasound, until further notice. Patient's husband is having knee surgery. She would like to wait at least 3 weeks.

## 2020-08-31 NOTE — Telephone Encounter (Signed)
Noted  

## 2020-09-02 ENCOUNTER — Other Ambulatory Visit: Payer: Self-pay

## 2020-09-02 ENCOUNTER — Other Ambulatory Visit (INDEPENDENT_AMBULATORY_CARE_PROVIDER_SITE_OTHER): Payer: Medicare Other

## 2020-09-02 DIAGNOSIS — E7800 Pure hypercholesterolemia, unspecified: Secondary | ICD-10-CM

## 2020-09-02 DIAGNOSIS — Z794 Long term (current) use of insulin: Secondary | ICD-10-CM | POA: Diagnosis not present

## 2020-09-02 DIAGNOSIS — E1159 Type 2 diabetes mellitus with other circulatory complications: Secondary | ICD-10-CM

## 2020-09-02 DIAGNOSIS — I10 Essential (primary) hypertension: Secondary | ICD-10-CM | POA: Diagnosis not present

## 2020-09-02 LAB — CBC WITH DIFFERENTIAL/PLATELET
Basophils Absolute: 0.1 10*3/uL (ref 0.0–0.1)
Basophils Relative: 0.7 % (ref 0.0–3.0)
Eosinophils Absolute: 0.2 10*3/uL (ref 0.0–0.7)
Eosinophils Relative: 2.4 % (ref 0.0–5.0)
HCT: 44.9 % (ref 36.0–46.0)
Hemoglobin: 14.8 g/dL (ref 12.0–15.0)
Lymphocytes Relative: 23.1 % (ref 12.0–46.0)
Lymphs Abs: 2.3 10*3/uL (ref 0.7–4.0)
MCHC: 33 g/dL (ref 30.0–36.0)
MCV: 83.8 fl (ref 78.0–100.0)
Monocytes Absolute: 0.9 10*3/uL (ref 0.1–1.0)
Monocytes Relative: 8.4 % (ref 3.0–12.0)
Neutro Abs: 6.6 10*3/uL (ref 1.4–7.7)
Neutrophils Relative %: 65.4 % (ref 43.0–77.0)
Platelets: 177 10*3/uL (ref 150.0–400.0)
RBC: 5.35 Mil/uL — ABNORMAL HIGH (ref 3.87–5.11)
RDW: 17.7 % — ABNORMAL HIGH (ref 11.5–15.5)
WBC: 10.1 10*3/uL (ref 4.0–10.5)

## 2020-09-02 LAB — HEPATIC FUNCTION PANEL
ALT: 16 U/L (ref 0–35)
AST: 12 U/L (ref 0–37)
Albumin: 3.8 g/dL (ref 3.5–5.2)
Alkaline Phosphatase: 46 U/L (ref 39–117)
Bilirubin, Direct: 0.1 mg/dL (ref 0.0–0.3)
Total Bilirubin: 0.4 mg/dL (ref 0.2–1.2)
Total Protein: 6.8 g/dL (ref 6.0–8.3)

## 2020-09-02 LAB — BASIC METABOLIC PANEL
BUN: 17 mg/dL (ref 6–23)
CO2: 27 mEq/L (ref 19–32)
Calcium: 9.2 mg/dL (ref 8.4–10.5)
Chloride: 103 mEq/L (ref 96–112)
Creatinine, Ser: 0.79 mg/dL (ref 0.40–1.20)
GFR: 78.4 mL/min (ref >60.00)
Glucose, Bld: 126 mg/dL — ABNORMAL HIGH (ref 70–99)
Potassium: 4.3 mEq/L (ref 3.5–5.1)
Sodium: 140 mEq/L (ref 135–145)

## 2020-09-02 LAB — LIPID PANEL
Cholesterol: 144 mg/dL (ref 0–200)
HDL: 52.7 mg/dL (ref >39.00)
LDL Cholesterol: 57 mg/dL (ref 0–99)
NonHDL: 91.58
Total CHOL/HDL Ratio: 3
Triglycerides: 171 mg/dL — ABNORMAL HIGH (ref 0.0–149.0)
VLDL: 34.2 mg/dL (ref 0.0–40.0)

## 2020-09-02 LAB — HEMOGLOBIN A1C: Hgb A1c MFr Bld: 7.9 % — ABNORMAL HIGH (ref 4.6–6.5)

## 2020-09-02 LAB — TSH: TSH: 1.34 u[IU]/mL (ref 0.35–4.50)

## 2020-09-06 ENCOUNTER — Other Ambulatory Visit: Payer: Self-pay | Admitting: *Deleted

## 2020-10-07 ENCOUNTER — Ambulatory Visit
Admission: RE | Admit: 2020-10-07 | Discharge: 2020-10-07 | Disposition: A | Discharge status: Home / Self Care | Payer: Medicare Other | Source: Ambulatory Visit | Attending: Internal Medicine | Admitting: Internal Medicine

## 2020-10-07 ENCOUNTER — Other Ambulatory Visit: Payer: Self-pay

## 2020-10-07 DIAGNOSIS — K760 Fatty (change of) liver, not elsewhere classified: Secondary | ICD-10-CM | POA: Insufficient documentation

## 2020-11-06 IMAGING — DX DG CHEST 1V PORT
1 series · 1 of 1 positions shown · non-contrast
Comparison: 10/13/2016

CLINICAL DATA: [id number] positive with shortness of breath and
fever.

EXAM:
PORTABLE CHEST 1 VIEW

[chest ap]
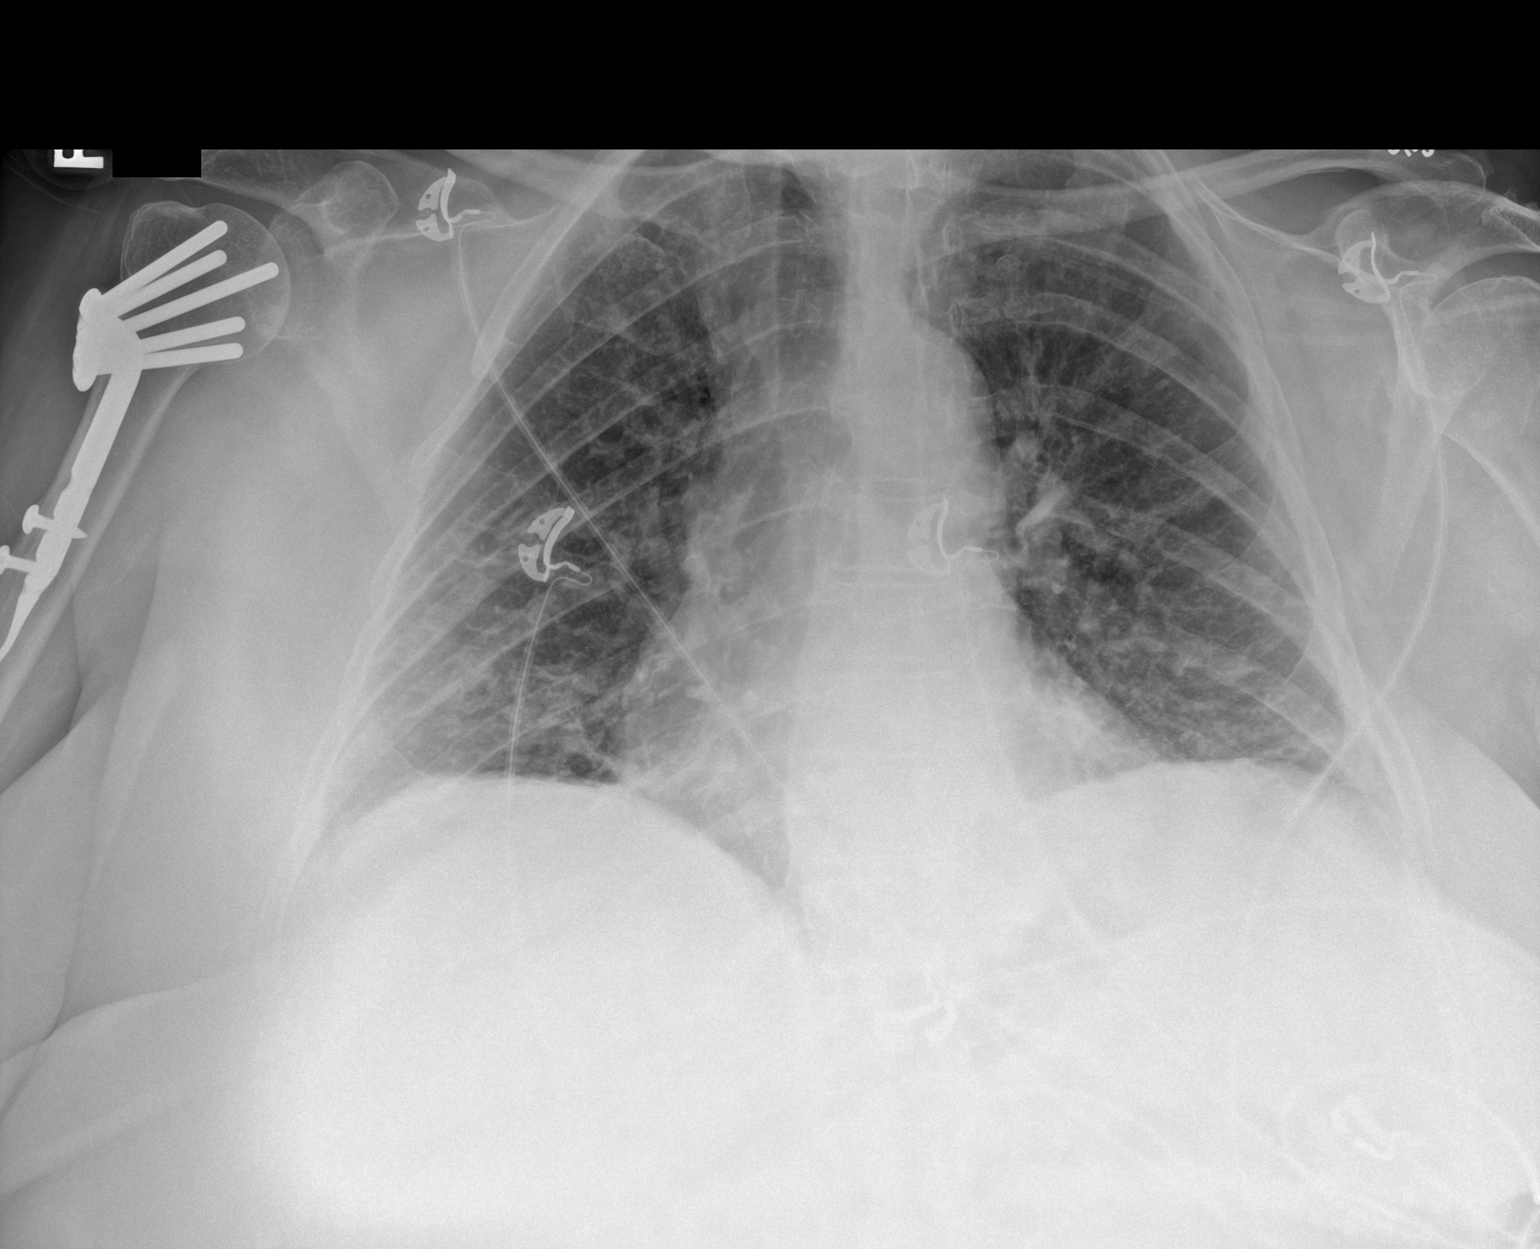

[1 of 1 positions shown; findings below may reference images not displayed]

FINDINGS: The cardiac silhouette, mediastinal and hilar contours are within
normal limits and stable. The lungs demonstrate underlying
emphysematous changes. Low lung volumes with vascular crowding.
Streaky bibasilar infiltrates but no effusions or edema. The bony
thorax is intact. Stable right shoulder hardware.
IMPRESSION: Underlying emphysema and low lung volumes.

Patchy basilar infiltrates.

## 2020-11-14 ENCOUNTER — Telehealth: Payer: Self-pay | Admitting: Internal Medicine

## 2020-11-14 MED ORDER — ROSUVASTATIN CALCIUM 5 MG PO TABS: 5.0000 mg | ORAL_TABLET | Freq: Every day | ORAL | 1 refills | Status: DC

## 2020-11-14 MED ORDER — RABEPRAZOLE SODIUM 20 MG PO TBEC: 20.0000 mg | DELAYED_RELEASE_TABLET | Freq: Two times a day (BID) | ORAL | 1 refills | Status: DC

## 2020-11-14 MED ORDER — BUPROPION HCL ER (XL) 300 MG PO TB24
300.0000 mg | ORAL_TABLET | Freq: Every day | ORAL | 1 refills | Status: DC
Start: 2020-11-14 — End: 2021-06-05

## 2020-11-14 MED ORDER — METFORMIN HCL 500 MG PO TABS: 1000.0000 mg | ORAL_TABLET | Freq: Two times a day (BID) | ORAL | 1 refills | Status: DC

## 2020-11-14 MED ORDER — BUPROPION HCL ER (XL) 300 MG PO TB24: 300.0000 mg | ORAL_TABLET | Freq: Every day | ORAL | 0 refills | Status: DC

## 2020-11-14 NOTE — Addendum Note (Signed)
Addended by: Elpidio Galea T on: 11/14/2020 03:32 PM   Modules accepted: Orders

## 2020-11-14 NOTE — Telephone Encounter (Addendum)
The patient no longer wants her medication going to [facility]. She is requesting her medication to go to [facility]. She is out of her  buPROPion (WELLBUTRIN XL) 300 MG 24 hr tablet Send one month of Wellbutrin to [facility] due to being out of the medication and the rest of the refills to [facility]   The patient is needing these other medication sent to [facility] also. rosuvastatin (CRESTOR) 5 MG tablet metFORMIN (GLUCOPHAGE) 500 MG tablet RABEprazole (ACIPHEX) 20 MG tablet

## 2020-11-16 ENCOUNTER — Emergency Department
Admission: EM | Admit: 2020-11-16 | Discharge: 2020-11-16 | Disposition: A | Discharge status: Home / Self Care | Payer: Medicare Other | Attending: Emergency Medicine | Admitting: Emergency Medicine

## 2020-11-16 ENCOUNTER — Other Ambulatory Visit: Payer: Self-pay

## 2020-11-16 ENCOUNTER — Emergency Department: Payer: Medicare Other

## 2020-11-16 ENCOUNTER — Encounter: Payer: Self-pay | Admitting: Emergency Medicine

## 2020-11-16 DIAGNOSIS — J45909 Unspecified asthma, uncomplicated: Secondary | ICD-10-CM | POA: Insufficient documentation

## 2020-11-16 DIAGNOSIS — I10 Essential (primary) hypertension: Secondary | ICD-10-CM | POA: Insufficient documentation

## 2020-11-16 DIAGNOSIS — E119 Type 2 diabetes mellitus without complications: Secondary | ICD-10-CM | POA: Insufficient documentation

## 2020-11-16 DIAGNOSIS — W540XXA Bitten by dog, initial encounter: Secondary | ICD-10-CM | POA: Diagnosis not present

## 2020-11-16 DIAGNOSIS — Z7951 Long term (current) use of inhaled steroids: Secondary | ICD-10-CM | POA: Diagnosis not present

## 2020-11-16 DIAGNOSIS — S61411A Laceration without foreign body of right hand, initial encounter: Secondary | ICD-10-CM | POA: Diagnosis not present

## 2020-11-16 DIAGNOSIS — Z23 Encounter for immunization: Secondary | ICD-10-CM | POA: Insufficient documentation

## 2020-11-16 DIAGNOSIS — Z955 Presence of coronary angioplasty implant and graft: Secondary | ICD-10-CM | POA: Insufficient documentation

## 2020-11-16 DIAGNOSIS — Z7982 Long term (current) use of aspirin: Secondary | ICD-10-CM | POA: Diagnosis not present

## 2020-11-16 DIAGNOSIS — Z8616 Personal history of COVID-19: Secondary | ICD-10-CM | POA: Diagnosis not present

## 2020-11-16 DIAGNOSIS — Z794 Long term (current) use of insulin: Secondary | ICD-10-CM | POA: Insufficient documentation

## 2020-11-16 DIAGNOSIS — Z7984 Long term (current) use of oral hypoglycemic drugs: Secondary | ICD-10-CM | POA: Diagnosis not present

## 2020-11-16 DIAGNOSIS — Z87891 Personal history of nicotine dependence: Secondary | ICD-10-CM | POA: Diagnosis not present

## 2020-11-16 DIAGNOSIS — S6991XA Unspecified injury of right wrist, hand and finger(s), initial encounter: Secondary | ICD-10-CM | POA: Diagnosis present

## 2020-11-16 DIAGNOSIS — J449 Chronic obstructive pulmonary disease, unspecified: Secondary | ICD-10-CM | POA: Diagnosis not present

## 2020-11-16 DIAGNOSIS — Z79899 Other long term (current) drug therapy: Secondary | ICD-10-CM | POA: Insufficient documentation

## 2020-11-16 DIAGNOSIS — I2510 Atherosclerotic heart disease of native coronary artery without angina pectoris: Secondary | ICD-10-CM | POA: Diagnosis not present

## 2020-11-16 MED ORDER — TETANUS-DIPHTH-ACELL PERTUSSIS 5-2.5-18.5 LF-MCG/0.5 IM SUSY
0.5000 mL | PREFILLED_SYRINGE | Freq: Once | INTRAMUSCULAR | Status: AC
  Administered 2020-11-16: 0.5 mL via INTRAMUSCULAR
  Filled 2020-11-16: qty 0.5

## 2020-11-16 MED ORDER — AMOXICILLIN-POT CLAVULANATE 875-125 MG PO TABS: 1.0000 | ORAL_TABLET | Freq: Two times a day (BID) | ORAL | 0 refills | Status: AC

## 2020-11-16 NOTE — ED Provider Notes (Signed)
[facility]EMERGENCY DEPARTMENT  ____________________________________________  Time seen: Approximately 8:26 PM  I have reviewed the triage vital signs and the nursing notes.   HISTORY  Chief Complaint Animal Bite   Historian Patient    HPI Amanda Wiley is a 65 y.o. female presents to the emergency department after patient was bitten by her own dog.  Patient has a skin tear along the dorsal aspect of her right hand near the thenar eminence and a small puncture wound along the ulnar aspect of the hand.  No numbness or tingling of the right hand.  Patient is left-hand dominant.  Patient's dog is completely up-to-date on rabies vaccine shots.  She cannot recall her last tetanus shot.   Past Medical History:  Diagnosis Date   Arthritis    Asthma    Complication of anesthesia    HARD TO WAKE UP   COPD (chronic obstructive pulmonary disease) ([place])    Crohn's disease ([place])    Depression    Diabetes mellitus ([place])    Fracture, foot    post fall   Hypercholesterolemia    Hypertension    IBS (irritable bowel syndrome)    Nephrolithiasis    s/p lithotripsy [facility])   Vitamin D deficiency      Immunizations up to date:  Yes.     Past Medical History:  Diagnosis Date   Arthritis    Asthma    Complication of anesthesia    HARD TO WAKE UP   COPD (chronic obstructive pulmonary disease) ([place])    Crohn's disease ([place])    Depression    Diabetes mellitus ([place])    Fracture, foot    post fall   Hypercholesterolemia    Hypertension    IBS (irritable bowel syndrome)    Nephrolithiasis    s/p lithotripsy [facility])   Vitamin D deficiency     Patient Active Problem List   Diagnosis Date Noted   Rash 08/13/2020   Femoral bruit 03/20/2020   Chest pain 03/10/2020   CAD (coronary artery disease), native coronary artery 03/10/2020   Bipolar 1 disorder ([place])    NSTEMI (non-ST elevated myocardial infarction) ([place]) 03/06/2020   Diabetes mellitus type  2, uncontrolled, with complications ([place]) 57/04/7791   Hepatic steatosis 02/04/2019   Acute respiratory disease due to COVID-19 virus 02/03/2019   Pneumonia due to 2019-nCoV 01/12/2019   Abnormal liver function tests 12/24/2018   Hepatomegaly 12/24/2018   Cyst of left kidney 12/22/2018   Sinusitis 06/21/2018   Polycythemia vera ([place]) 04/04/2018   Papilloma of left breast 03/04/2018   Nipple discharge 02/16/2018   Health care maintenance 12/11/2014   Mild depression ([place]) 12/09/2014   Allergic drug reaction 12/09/2014   Personal history of urinary calculi 12/09/2014   Obesity, diabetes, and hypertension syndrome ([place]) 04/18/2014   Asthma 03/04/2012   Vitamin D deficiency 03/04/2012   Crohn's disease ([place]) 03/04/2012   GERD (gastroesophageal reflux disease) 03/04/2012   Diabetes mellitus ([place]) 03/04/2012   Hypertension 03/04/2012   Acid reflux 03/04/2012   Essential hypertension 03/04/2012   Hypercholesterolemia without hypertriglyceridemia 03/04/2012   CD (Crohn's disease) ([place]) 03/04/2012   Adaptive colitis 01/30/2007    Past Surgical History:  Procedure Laterality Date   [place] EXCISION Left 09/04/2019   Procedure: EXCISION DUCTAL SYSTEM BREAST;  Surgeon: Robert Bellow, MD;  Location: [facility] ORS;  Service: General;  Laterality: Left;   BREAST EXCISIONAL BIOPSY Right 2013  benign   COLONOSCOPY  2014   COLONOSCOPY WITH PROPOFOL N/A 02/10/2020   Procedure: COLONOSCOPY WITH PROPOFOL;  Surgeon: Robert Bellow, MD;  Location: [facility] ENDOSCOPY;  Service: Endoscopy;  Laterality: N/A;   CORONARY STENT INTERVENTION N/A 03/07/2020   Procedure: CORONARY STENT INTERVENTION;  Surgeon: Isaias Cowman, MD;  Location: [place] CV LAB;  Service: Cardiovascular;  Laterality: N/A;   EXCISION / BIOPSY BREAST / NIPPLE / DUCT Left 2019   papilloma   EXCISION OF BREAST BIOPSY Left 2019   papilloma   LAPAROSCOPIC  CHOLECYSTECTOMY  2005   Dr Tamala Julian   LAPAROSCOPIC SUPRACERVICAL HYSTERECTOMY  1985   left ovary not removed   LEFT HEART CATH AND CORONARY ANGIOGRAPHY N/A 03/07/2020   Procedure: LEFT HEART CATH AND CORONARY ANGIOGRAPHY;  Surgeon: Isaias Cowman, MD;  Location: [place] [facility];  Service: Cardiovascular;  Laterality: N/A;   LEFT HEART CATH AND CORONARY ANGIOGRAPHY N/A 03/10/2020   Procedure: LEFT HEART CATH AND CORONARY ANGIOGRAPHY and possible PCI and stent;  Surgeon: Yolonda Kida, MD;  Location: [place] [facility];  Service: Cardiovascular;  Laterality: N/A;   LITHOTRIPSY     x7   OOPHORECTOMY     POLYPECTOMY     SHOULDER SURGERY     right (pinning)   SHOULDER SURGERY     left (pinning)   TONSILLECTOMY  1966    Prior to Admission medications   Medication Sig Start Date End Date Taking? Authorizing Provider  amoxicillin-clavulanate (AUGMENTIN) 875-125 MG tablet Take 1 tablet by mouth 2 (two) times daily for 10 days. 11/16/20 11/26/20 Yes Vallarie Mare M, PA-C  acetaminophen (TYLENOL) 325 MG tablet Take 2 tablets (650 mg total) by mouth every 6 (six) hours as needed for mild pain (or Fever >/= 101). 02/03/19   Flora Lipps, MD  albuterol (PROVENTIL) (2.5 MG/3ML) 0.083% nebulizer solution Take 2.5 mg by nebulization every 6 (six) hours as needed for wheezing.    [provider]  amLODipine (NORVASC) 5 MG tablet Take 1 tablet by mouth daily. 03/11/20   [provider]  aspirin 81 MG chewable tablet Chew 1 tablet by mouth daily. 03/08/20   [provider]  buPROPion (WELLBUTRIN XL) 300 MG 24 hr tablet Take 1 tablet (300 mg total) by mouth daily. Take 1 tablet (300 mg total) by mouth daily. 11/14/20   Einar Pheasant, MD  clotrimazole-betamethasone (LOTRISONE) cream Apply 1 application topically 2 (two) times daily. 08/12/20   Einar Pheasant, MD  Dulaglutide (TRULICITY) 1.5 BD/5.3GD SOPN Inject 1.5 mg into the skin once a week. ON SUNDAYS    [provider]  fluconazole (DIFLUCAN) 150 MG tablet Take one tablet x 1 day and then may repeat x 1 in three days if not improved. 08/12/20   Einar Pheasant, MD  fluticasone-salmeterol (ADVAIR HFA) [phone] MCG/ACT inhaler Inhale 2 puffs into the lungs 2 (two) times daily. 07/27/20   Einar Pheasant, MD  FREESTYLE LITE test strip  07/27/20   [provider]  hyoscyamine (LEVSIN SL) 0.125 MG SL tablet Place 0.125 mg under the tongue as needed.    [provider]  insulin glargine (LANTUS) 100 UNIT/ML injection Inject 20 Units into the skin at bedtime.     [provider]  ipratropium (ATROVENT) 0.06 % nasal spray Place 2 sprays into both nostrils 3 (three) times daily as needed for rhinitis.    [provider]  Ipratropium-Albuterol (COMBIVENT) 20-100 MCG/ACT AERS respimat Inhale 2 puffs into the lungs every  4 (four) hours while awake. Patient taking differently: Inhale 1 puff into the lungs 2 (two) times daily. And as needed as a rescue inhaler. 02/16/19   Einar Pheasant, MD  isosorbide mononitrate (IMDUR) 30 MG 24 hr tablet Take 1 tablet (30 mg total) by mouth daily. 03/11/20   Loletha Grayer, MD  JARDIANCE 25 MG TABS tablet Take 25 mg by mouth daily. 04/24/19   [provider]  LORazepam (ATIVAN) 0.5 MG tablet  08/10/19   [provider]  losartan (COZAAR) 25 MG tablet Take 1 tablet (25 mg total) by mouth daily. 03/08/20   Avie Arenas, PA-C  metFORMIN (GLUCOPHAGE) 500 MG tablet Take 2 tablets (1,000 mg total) by mouth 2 (two) times daily. 11/14/20   Einar Pheasant, MD  metoprolol tartrate (LOPRESSOR) 25 MG tablet Take 1 tablet (25 mg total) by mouth 2 (two) times daily. 08/04/20   Einar Pheasant, MD  nitroGLYCERIN (NITROSTAT) 0.4 MG SL tablet Place 1 tablet (0.4 mg total) under the tongue every 5 (five) minutes as needed for chest pain. 03/11/20   Loletha Grayer, MD  prasugrel (EFFIENT) 10 MG TABS tablet Take 1 tablet (10 mg total) by mouth  daily. 03/08/20   Avie Arenas, PA-C  RABEprazole (ACIPHEX) 20 MG tablet Take 1 tablet (20 mg total) by mouth in the morning and at bedtime. 11/14/20   Einar Pheasant, MD  rosuvastatin (CRESTOR) 5 MG tablet Take 1 tablet (5 mg total) by mouth daily. 11/14/20   Einar Pheasant, MD  ULTRACARE PEN NEEDLES 32G X 4 MM MISC  02/10/19   [provider]  vitamin C (VITAMIN C) 1000 MG tablet Take 1 tablet (1,000 mg total) by mouth daily. 02/03/19   Flora Lipps, MD    Allergies Avelox [moxifloxacin hcl in nacl], Moxifloxacin, 5-alpha reductase inhibitors, Allegra [fexofenadine hcl], Ibuprofen, Sulfa antibiotics, Tegretol [carbamazepine], Iodine, Ioxaglate, and Ivp dye [iodinated diagnostic agents]  Family History  Problem Relation Age of Onset   Heart disease Father    Emphysema Maternal Grandmother    COPD Mother    Hypercholesterolemia Mother    Emphysema Mother    Hypertension Brother    Emphysema Other        grandmother   COPD Sister    Crohn's disease Sister    Hypertension Sister    Breast cancer Neg Hx    Colon cancer Neg Hx     Social History Social History   Tobacco Use   Smoking status: Former    Packs/day: 2.00    Years: 15.00    Pack years: 30.00    Types: Cigarettes    Quit date: 04/09/1997    Years since quitting: 23.6   Smokeless tobacco: Never  Vaping Use   Vaping Use: Never used  Substance Use Topics   Alcohol use: Never    Alcohol/week: 0.0 standard drinks   Drug use: No     Review of Systems  Constitutional: No fever/chills Eyes:  No discharge ENT: No upper respiratory complaints. Respiratory: no cough. No SOB/ use of accessory muscles to breath Gastrointestinal:   No nausea, no vomiting.  No diarrhea.  No constipation. Musculoskeletal: Negative for musculoskeletal pain. Skin: Patient has skin tear.     ____________________________________________   PHYSICAL EXAM:  VITAL SIGNS: ED Triage Vitals  Enc Vitals Group     BP 11/16/20  1741 (!) 173/89     Pulse Rate 11/16/20 1741 84     Resp 11/16/20 1741 18     Temp  11/16/20 1741 97.6 F (36.4 C)     Temp Source 11/16/20 1741 Oral     SpO2 11/16/20 1741 96 %     Weight 11/16/20 1739 216 lb (98 kg)     Height 11/16/20 1739 5' 5"  (1.651 m)     Head Circumference --      Peak Flow --      Pain Score 11/16/20 1739 8     Pain Loc --      Pain Edu? --      Excl. in [place]? --      Constitutional: Alert and oriented. Well appearing and in no acute distress. Eyes: Conjunctivae are normal. PERRL. EOMI. Head: Atraumatic. ENT:      Nose: No congestion/rhinnorhea.      Mouth/Throat: Mucous membranes are moist.  Neck: No stridor.  No cervical spine tenderness to palpation. Cardiovascular: Normal rate, regular rhythm. Normal S1 and S2.  Good peripheral circulation. Respiratory: Normal respiratory effort without tachypnea or retractions. Lungs CTAB. Good air entry to the bases with no decreased or absent breath sounds Gastrointestinal: Bowel sounds x 4 quadrants. Soft and nontender to palpation. No guarding or rigidity. No distention.* Musculoskeletal: Full range of motion to all extremities. No obvious deformities noted Neurologic:  Normal for age. No gross focal neurologic deficits are appreciated.  Skin: Patient has skin tear along the dorsal aspect of the right hand with overlying ecchymosis and a small puncture wound along the ulnar aspect of the right hand.  Palpable radial pulse bilaterally and symmetrically.  Capillary refill less than 2 seconds on the right. Psychiatric: Mood and affect are normal for age. Speech and behavior are normal.   ____________________________________________   LABS (all labs ordered are listed, but only abnormal results are displayed)  Labs Reviewed - No data to display ____________________________________________  EKG   ____________________________________________  RADIOLOGY Unk Pinto, personally viewed and evaluated these  images (plain radiographs) as part of my medical decision making, as well as reviewing the written report by the radiologist.    DG Hand 2 View Right  Result Date: 11/16/2020 CLINICAL DATA:  Dog bite EXAM: RIGHT HAND - 2 VIEW COMPARISON:  None. FINDINGS: No acute bony abnormality. Specifically, no fracture, subluxation, or dislocation. No soft tissue gas. IMPRESSION: No acute bony abnormality. Electronically Signed   By: Rolm Baptise M.D.   On: 11/16/2020 20:39    ____________________________________________    PROCEDURES  Procedure(s) performed:     Procedures     Medications  Tdap (BOOSTRIX) injection 0.5 mL (0.5 mLs Intramuscular Given 11/16/20 2021)     ____________________________________________   INITIAL IMPRESSION / ASSESSMENT AND PLAN / ED COURSE  Pertinent labs & imaging results that were available during my care of the patient were reviewed by me and considered in my medical decision making (see chart for details).      Assessment and Plan:  Dog bite 65 year old female presents to the emergency department with a dog bite along the dorsal aspect of the right hand.  Patient was hypertensive at triage but vital signs were otherwise reassuring.  Patient had no retained teeth or acute fractures on x-ray of the right hand.  Patient was given a tetanus shot in the emergency department and discharged with Augmentin twice daily for the next 10 days.  She does not wish to initiate rabies vaccine series as her dog is up-to-date on shots.  Return precautions were given to return with new or worsening symptoms.  I stressed that  dog bite wounds are prone to infection without compliance to antibiotic.  She voiced understanding.    ____________________________________________  FINAL CLINICAL IMPRESSION(S) / ED DIAGNOSES  Final diagnoses:  Dog bite, initial encounter      NEW MEDICATIONS STARTED DURING THIS VISIT:  ED Discharge Orders          Ordered     amoxicillin-clavulanate (AUGMENTIN) 875-125 MG tablet  2 times daily        11/16/20 2021                This chart was dictated using voice recognition software/Dragon. Despite best efforts to proofread, errors can occur which can change the meaning. Any change was purely unintentional.     Karren Cobble 11/16/20 2119    Carrie Mew, MD 11/20/20 [phone]

## 2020-11-16 NOTE — Discharge Instructions (Signed)
Take Augmentin twice daily for ten days. Return to the emergency department if you develop any redness or streaking surrounding the wound site.

## 2020-11-16 NOTE — ED Triage Notes (Signed)
Pt comes into the ED via POV c/o dog bite to the right hand.  [name] dog just had surgery and was disoriented from anesthesia and when she picked him up, he bit her.  [name] dog is up to date on all rabies and other vaccines.  Pt states the wound continues to bleed and that's why she came in.  Pt denies any blood thinners.  Pt has band aid on the wound at this time and is in NAD.

## 2020-11-21 ENCOUNTER — Telehealth: Payer: Self-pay

## 2020-11-21 ENCOUNTER — Encounter: Payer: Self-pay | Admitting: Internal Medicine

## 2020-11-21 NOTE — Telephone Encounter (Signed)
Pt was bitten by dog on 11/16/20. Pt went to ED that day and was told to follow up with PCP/ Pt has steristrips on right hand. Please advise.

## 2020-11-21 NOTE — Telephone Encounter (Signed)
Dr Nicki Reaper- we have a 12:00 opening where you can see her this week. Just want to confirm with you that this is ok to schedule and does not conflict with your schedule (meetings, etc). Just let me know. Patient is aware you are out of the office today.

## 2020-11-21 NOTE — Telephone Encounter (Signed)
Please look at the pictures attached. She was bit by her dog on 08/10- seen at ED, steri strips placed, and started on abx for 10 days. She is half way through course of abx and needs follow up appt but wanted you to look at this and confirm ok to wait to be seen. She says it does look better, less painful. Still red around the bite but says that the redness is not worsening. The edges of the steri strips are starting come off but patient says the middle is still secure.

## 2020-11-21 NOTE — Telephone Encounter (Signed)
See my chart message sent to [facility]

## 2020-11-22 ENCOUNTER — Ambulatory Visit: Payer: Self-pay

## 2020-11-22 ENCOUNTER — Ambulatory Visit
Admission: EM | Admit: 2020-11-22 | Discharge: 2020-11-22 | Disposition: A | Discharge status: Home / Self Care | Payer: Medicare Other

## 2020-11-22 ENCOUNTER — Other Ambulatory Visit: Payer: Self-pay

## 2020-11-22 DIAGNOSIS — S61451D Open bite of right hand, subsequent encounter: Secondary | ICD-10-CM | POA: Diagnosis not present

## 2020-11-22 DIAGNOSIS — W540XXD Bitten by dog, subsequent encounter: Secondary | ICD-10-CM

## 2020-11-22 NOTE — ED Provider Notes (Signed)
[facility]    CSN: [id number] Arrival date & time: 11/22/20  1350      History   Chief Complaint Chief Complaint  Patient presents with   Wound Check    HPI Amanda Wiley is a 65 y.o. female who presents with R hand wound from  her dog biting her when she picked him up after having surgery and he did not like that since she caused him to hurt. Has noticed some clear fluid draining from it. The top strip came off today and she placed a new one. Has been taking Augmentin qd since then.    Past Medical History:  Diagnosis Date   Arthritis    Asthma    Complication of anesthesia    HARD TO WAKE UP   COPD (chronic obstructive pulmonary disease) ([place])    Crohn's disease ([place])    Depression    Diabetes mellitus ([place])    Fracture, foot    post fall   Hypercholesterolemia    Hypertension    IBS (irritable bowel syndrome)    Nephrolithiasis    s/p lithotripsy [facility])   Vitamin D deficiency     Patient Active Problem List   Diagnosis Date Noted   Rash 08/13/2020   Femoral bruit 03/20/2020   Chest pain 03/10/2020   CAD (coronary artery disease), native coronary artery 03/10/2020   Bipolar 1 disorder ([place])    NSTEMI (non-ST elevated myocardial infarction) ([place]) 03/06/2020   Diabetes mellitus type 2, uncontrolled, with complications ([place]) 60/73/7106   Hepatic steatosis 02/04/2019   Acute respiratory disease due to COVID-19 virus 02/03/2019   Pneumonia due to 2019-nCoV 01/12/2019   Abnormal liver function tests 12/24/2018   Hepatomegaly 12/24/2018   Cyst of left kidney 12/22/2018   Sinusitis 06/21/2018   Polycythemia vera ([place]) 04/04/2018   Papilloma of left breast 03/04/2018   Nipple discharge 02/16/2018   Health care maintenance 12/11/2014   Mild depression ([place]) 12/09/2014   Allergic drug reaction 12/09/2014   Personal history of urinary calculi 12/09/2014   Obesity, diabetes, and hypertension syndrome ([place]) 04/18/2014   Asthma  03/04/2012   Vitamin D deficiency 03/04/2012   Crohn's disease ([place]) 03/04/2012   GERD (gastroesophageal reflux disease) 03/04/2012   Diabetes mellitus ([place]) 03/04/2012   Hypertension 03/04/2012   Acid reflux 03/04/2012   Essential hypertension 03/04/2012   Hypercholesterolemia without hypertriglyceridemia 03/04/2012   CD (Crohn's disease) ([place]) 03/04/2012   Adaptive colitis 01/30/2007    Past Surgical History:  Procedure Laterality Date   [place] EXCISION Left 09/04/2019   Procedure: EXCISION DUCTAL SYSTEM BREAST;  Surgeon: Robert Bellow, MD;  Location: [facility] ORS;  Service: General;  Laterality: Left;   BREAST EXCISIONAL BIOPSY Right 2013   benign   COLONOSCOPY  2014   COLONOSCOPY WITH PROPOFOL N/A 02/10/2020   Procedure: COLONOSCOPY WITH PROPOFOL;  Surgeon: Robert Bellow, MD;  Location: [facility]COPY;  Service: Endoscopy;  Laterality: N/A;   CORONARY STENT INTERVENTION N/A 03/07/2020   Procedure: CORONARY STENT INTERVENTION;  Surgeon: Isaias Cowman, MD;  Location: [place] [facility];  Service: Cardiovascular;  Laterality: N/A;   EXCISION / BIOPSY BREAST / NIPPLE / DUCT Left 2019   papilloma   EXCISION OF BREAST BIOPSY Left 2019   papilloma   LAPAROSCOPIC CHOLECYSTECTOMY  2005   Dr Tamala Julian   LAPAROSCOPIC SUPRACERVICAL HYSTERECTOMY  1985   left ovary not removed   LEFT  HEART CATH AND CORONARY ANGIOGRAPHY N/A 03/07/2020   Procedure: LEFT HEART CATH AND CORONARY ANGIOGRAPHY;  Surgeon: Isaias Cowman, MD;  Location: [place] [facility];  Service: Cardiovascular;  Laterality: N/A;   LEFT HEART CATH AND CORONARY ANGIOGRAPHY N/A 03/10/2020   Procedure: LEFT HEART CATH AND CORONARY ANGIOGRAPHY and possible PCI and stent;  Surgeon: Yolonda Kida, MD;  Location: [place] [facility];  Service: Cardiovascular;  Laterality: N/A;   LITHOTRIPSY     x7   OOPHORECTOMY     POLYPECTOMY     SHOULDER  SURGERY     right (pinning)   SHOULDER SURGERY     left (pinning)   TONSILLECTOMY  1966    OB History     Gravida  2   Para  2   Term      Preterm      AB      Living         SAB      IAB      Ectopic      Multiple      Live Births           Obstetric Comments  1st Menstrual Cycle:  9  1st Pregnancy:  23           Home Medications    Prior to Admission medications   Medication Sig Start Date End Date Taking? Authorizing Provider  acetaminophen (TYLENOL) 325 MG tablet Take 2 tablets (650 mg total) by mouth every 6 (six) hours as needed for mild pain (or Fever >/= 101). 02/03/19   Flora Lipps, MD  albuterol (PROVENTIL) (2.5 MG/3ML) 0.083% nebulizer solution Take 2.5 mg by nebulization every 6 (six) hours as needed for wheezing.    [provider]  amLODipine (NORVASC) 5 MG tablet Take 1 tablet by mouth daily. 03/11/20   [provider]  amoxicillin-clavulanate (AUGMENTIN) 875-125 MG tablet Take 1 tablet by mouth 2 (two) times daily for 10 days. 11/16/20 11/26/20  Lannie Fields, PA-C  aspirin 81 MG chewable tablet Chew 1 tablet by mouth daily. 03/08/20   [provider]  buPROPion (WELLBUTRIN XL) 300 MG 24 hr tablet Take 1 tablet (300 mg total) by mouth daily. Take 1 tablet (300 mg total) by mouth daily. 11/14/20   Einar Pheasant, MD  clotrimazole-betamethasone (LOTRISONE) cream Apply 1 application topically 2 (two) times daily. 08/12/20   Einar Pheasant, MD  Dulaglutide (TRULICITY) 1.5 KZ/9.9JT SOPN Inject 1.5 mg into the skin once a week. ON SUNDAYS    [provider]  fluconazole (DIFLUCAN) 150 MG tablet Take one tablet x 1 day and then may repeat x 1 in three days if not improved. 08/12/20   Einar Pheasant, MD  fluticasone-salmeterol (ADVAIR HFA) 3146561005 MCG/ACT inhaler Inhale 2 puffs into the lungs 2 (two) times daily. 07/27/20   Einar Pheasant, MD  FREESTYLE LITE test strip  07/27/20   [provider]  hyoscyamine  (LEVSIN SL) 0.125 MG SL tablet Place 0.125 mg under the tongue as needed.    [provider]  insulin glargine (LANTUS) 100 UNIT/ML injection Inject 20 Units into the skin at bedtime.     [provider]  ipratropium (ATROVENT) 0.06 % nasal spray Place 2 sprays into both nostrils 3 (three) times daily as needed for rhinitis.    [provider]  Ipratropium-Albuterol (COMBIVENT) 20-100 MCG/ACT AERS respimat Inhale 2 puffs into the lungs every 4 (four) hours while awake. Patient taking differently: Inhale 1  puff into the lungs 2 (two) times daily. And as needed as a rescue inhaler. 02/16/19   Einar Pheasant, MD  isosorbide mononitrate (IMDUR) 30 MG 24 hr tablet Take 1 tablet (30 mg total) by mouth daily. 03/11/20   Loletha Grayer, MD  JARDIANCE 25 MG TABS tablet Take 25 mg by mouth daily. 04/24/19   [provider]  LORazepam (ATIVAN) 0.5 MG tablet  08/10/19   [provider]  losartan (COZAAR) 25 MG tablet Take 1 tablet (25 mg total) by mouth daily. 03/08/20   Avie Arenas, PA-C  metFORMIN (GLUCOPHAGE) 500 MG tablet Take 2 tablets (1,000 mg total) by mouth 2 (two) times daily. 11/14/20   Einar Pheasant, MD  metoprolol tartrate (LOPRESSOR) 25 MG tablet Take 1 tablet (25 mg total) by mouth 2 (two) times daily. 08/04/20   Einar Pheasant, MD  nitroGLYCERIN (NITROSTAT) 0.4 MG SL tablet Place 1 tablet (0.4 mg total) under the tongue every 5 (five) minutes as needed for chest pain. 03/11/20   Loletha Grayer, MD  prasugrel (EFFIENT) 10 MG TABS tablet Take 1 tablet (10 mg total) by mouth daily. 03/08/20   Avie Arenas, PA-C  RABEprazole (ACIPHEX) 20 MG tablet Take 1 tablet (20 mg total) by mouth in the morning and at bedtime. 11/14/20   Einar Pheasant, MD  rosuvastatin (CRESTOR) 5 MG tablet Take 1 tablet (5 mg total) by mouth daily. 11/14/20   Einar Pheasant, MD  ULTRACARE PEN NEEDLES 32G X 4 MM MISC  02/10/19   [provider]  vitamin C (VITAMIN C)  1000 MG tablet Take 1 tablet (1,000 mg total) by mouth daily. 02/03/19   Flora Lipps, MD    Family History Family History  Problem Relation Age of Onset   Heart disease Father    Emphysema Maternal Grandmother    COPD Mother    Hypercholesterolemia Mother    Emphysema Mother    Hypertension Brother    Emphysema Other        grandmother   COPD Sister    Crohn's disease Sister    Hypertension Sister    Breast cancer Neg Hx    Colon cancer Neg Hx     Social History Social History   Tobacco Use   Smoking status: Former    Packs/day: 2.00    Years: 15.00    Pack years: 30.00    Types: Cigarettes    Quit date: 04/09/1997    Years since quitting: 23.6   Smokeless tobacco: Never  Vaping Use   Vaping Use: Never used  Substance Use Topics   Alcohol use: Never    Alcohol/week: 0.0 standard drinks   Drug use: No     Allergies   Avelox [moxifloxacin hcl in nacl], Moxifloxacin, 5-alpha reductase inhibitors, Allegra [fexofenadine hcl], Ibuprofen, Sulfa antibiotics, Tegretol [carbamazepine], Iodine, Ioxaglate, and Ivp dye [iodinated diagnostic agents]   Review of Systems Review of Systems  Musculoskeletal:  Negative for gait problem.  Skin:  Positive for color change and wound. Negative for pallor and rash.  Hematological:  Negative for adenopathy.    Physical Exam Triage Vital Signs ED Triage Vitals [11/22/20 1407]  Enc Vitals Group     BP 138/80     Pulse Rate 88     Resp 18     Temp 98.3 F (36.8 C)     Temp Source Oral     SpO2 95 %     Weight      Height  Head Circumference      Peak Flow      Pain Score 3     Pain Loc      Pain Edu?      Excl. in [place]?    No data found.  Updated Vital Signs BP 138/80 (BP Location: Left Arm)   Pulse 88   Temp 98.3 F (36.8 C) (Oral)   Resp 18   SpO2 95%   Visual Acuity Right Eye Distance:   Left Eye Distance:   Bilateral Distance:    Right Eye Near:   Left Eye Near:    Bilateral Near:     Physical  Exam Vitals and nursing note reviewed.  Constitutional:      General: She is not in acute distress.    Appearance: She is obese. She is not toxic-appearing.  HENT:     Head: Normocephalic.     Right Ear: External ear normal.     Left Ear: External ear normal.  Eyes:     General: No scleral icterus.    Conjunctiva/sclera: Conjunctivae normal.  Pulmonary:     Effort: Pulmonary effort is normal.  Musculoskeletal:        General: Normal range of motion.     Cervical back: Neck supple.  Lymphadenopathy:     Upper Body:     Right upper body: No axillary adenopathy.  Skin:    General: Skin is warm and dry.     Comments: R HAND- wound is covered with steri strips which the corners are getting loose. The area is not hot, has mild redness around, but no streaking   Neurological:     Mental Status: She is alert and oriented to person, place, and time.     Gait: Gait normal.  Psychiatric:        Mood and Affect: Mood normal.        Thought Content: Thought content normal.        Judgment: Judgment normal.     UC Treatments / Results  Labs (all labs ordered are listed, but only abnormal results are displayed) Labs Reviewed - No data to display  EKG   Radiology No results found.  Procedures Procedures (including critical care time)  Medications Ordered in UC Medications - No data to display  Initial Impression / Assessment and Plan / UC Course  I have reviewed the triage vital signs and the nursing notes. I gave her extra steri strips and needs to keep them on for 4 more days. Dressing applied on wound. See instructions      Final Clinical Impressions(s) / UC Diagnoses   Final diagnoses:  None     Discharge Instructions      Your wound is not getting more infected, is looking good. The clear matter draining is normal  and called serous fluid. Keep it dry for 4 more days, then is OK to get it wet after that. Follow up with your primary care next week.      ED  Prescriptions   None    PDMP not reviewed this encounter.   Shelby Mattocks, [place] 11/22/20 1748

## 2020-11-22 NOTE — Telephone Encounter (Signed)
Given her history of diabetes and given dog bite with redness around incision, I would like for her to go ahead and be evaluated and then we can f/u after.  Don't want her to wait until end of week.  Call us with update after seen.

## 2020-11-22 NOTE — Telephone Encounter (Signed)
Patient aware and I have made her an appointment with urgent care for today at 2:00. The physical therapist is coming today at 65 so she could not go until after that.

## 2020-11-22 NOTE — ED Triage Notes (Signed)
Pt sts she have a dog bite to R hand on 8/10. She was seen at [facility] after the dog bite. Sts there have been some drainage from the wound. Steri strips are in place. Prescribed 10 day course of abx.

## 2020-11-22 NOTE — Discharge Instructions (Addendum)
Your wound is not getting more infected, is looking good. The clear matter draining is normal  and called serous fluid. Keep it dry for 4 more days, then is OK to get it wet after that. Follow up with your primary care next week.

## 2020-11-24 NOTE — Telephone Encounter (Signed)
Pt called and states that UC wants her to follow up with PCP next week. Provider has no openings available. Please advise

## 2020-11-25 NOTE — Telephone Encounter (Signed)
Patient has been scheduled

## 2020-11-28 ENCOUNTER — Other Ambulatory Visit: Payer: Self-pay

## 2020-11-28 MED ORDER — METOPROLOL TARTRATE 25 MG PO TABS: 25.0000 mg | ORAL_TABLET | Freq: Two times a day (BID) | ORAL | 1 refills | Status: DC

## 2020-11-29 ENCOUNTER — Encounter: Payer: Self-pay | Admitting: Internal Medicine

## 2020-11-29 ENCOUNTER — Ambulatory Visit (INDEPENDENT_AMBULATORY_CARE_PROVIDER_SITE_OTHER): Payer: Medicare Other | Admitting: Internal Medicine

## 2020-11-29 ENCOUNTER — Telehealth: Payer: Self-pay | Admitting: Internal Medicine

## 2020-11-29 ENCOUNTER — Other Ambulatory Visit: Payer: Self-pay

## 2020-11-29 VITALS — BP 132/76 | HR 64 | Temp 98.0°F | Resp 16 | Ht 66.0 in | Wt 217.2 lb

## 2020-11-29 DIAGNOSIS — I10 Essential (primary) hypertension: Secondary | ICD-10-CM

## 2020-11-29 DIAGNOSIS — F32A Depression, unspecified: Secondary | ICD-10-CM

## 2020-11-29 DIAGNOSIS — E1169 Type 2 diabetes mellitus with other specified complication: Secondary | ICD-10-CM

## 2020-11-29 DIAGNOSIS — R21 Rash and other nonspecific skin eruption: Secondary | ICD-10-CM | POA: Diagnosis not present

## 2020-11-29 DIAGNOSIS — E1159 Type 2 diabetes mellitus with other circulatory complications: Secondary | ICD-10-CM | POA: Diagnosis not present

## 2020-11-29 DIAGNOSIS — I25119 Atherosclerotic heart disease of native coronary artery with unspecified angina pectoris: Secondary | ICD-10-CM

## 2020-11-29 DIAGNOSIS — E7800 Pure hypercholesterolemia, unspecified: Secondary | ICD-10-CM

## 2020-11-29 DIAGNOSIS — Z794 Long term (current) use of insulin: Secondary | ICD-10-CM

## 2020-11-29 DIAGNOSIS — F320 Major depressive disorder, single episode, mild: Secondary | ICD-10-CM

## 2020-11-29 DIAGNOSIS — K219 Gastro-esophageal reflux disease without esophagitis: Secondary | ICD-10-CM

## 2020-11-29 DIAGNOSIS — W540XXD Bitten by dog, subsequent encounter: Secondary | ICD-10-CM

## 2020-11-29 DIAGNOSIS — E669 Obesity, unspecified: Secondary | ICD-10-CM

## 2020-11-29 DIAGNOSIS — I152 Hypertension secondary to endocrine disorders: Secondary | ICD-10-CM

## 2020-11-29 DIAGNOSIS — J4520 Mild intermittent asthma, uncomplicated: Secondary | ICD-10-CM

## 2020-11-29 DIAGNOSIS — D45 Polycythemia vera: Secondary | ICD-10-CM

## 2020-11-29 DIAGNOSIS — K5090 Crohn's disease, unspecified, without complications: Secondary | ICD-10-CM

## 2020-11-29 NOTE — Telephone Encounter (Signed)
Patient seen in office this morning. Wanting to ensure a dermatology referral will placed for her as her previous doctor is no longer in practice.   Please advise

## 2020-11-29 NOTE — Telephone Encounter (Signed)
FYI- patient was seen this morning. This was discussed

## 2020-11-29 NOTE — Progress Notes (Signed)
[name], female   DOB: [birth year], 65 y.o.   MRN: [id number]   Subjective:    [name], female    DOB: 01-May-1955, 65 y.o.   MRN: [id number]  HPI This visit occurred during the SARS-CoV-2 public health emergency.  Safety protocols were in place, including screening questions prior to the visit, additional usage of staff PPE, and extensive cleaning of exam room while observing appropriate contact time as indicated for disinfecting solutions.   Patient here for work in appt.  Work in s/p dog bite.  Also followed up on her diabetes, hypertension and cholesterol.  Her dog did bite her under her right thumb.  She was seen in the emergency room 11/16/20.  Had follow-up 11/22/2020.comes in today for follow-up.  Steri-Strips are coming off.  Removed today.  It is scabbed over.  No evidence of any surrounding infection.  She is completing the Augmentin.  Has 2 pills left.  Able to move her hand without any pain or difficulty.  No increased swelling.  Her husband just had knee surgery.  Her dog had knee surgery.  She is taking care of both of them.  Increased stress related.  She feels she is handling things relatively well.  No chest pain or shortness of breath reported.  Her breathing has been doing well.  No increased cough or congestion.  She does see Dr. Gabriel Carina for diabetes.  Her Trulicity and Lantus have both been increased.  States her blood sugars in the mornings are averaging less than 130 now.  No acid reflux reported.  No abdominal pain or cramping.  No urine change.  Some loose stool with augmentin.  Eating yogurt.  Discussed probiotics.  Persistent rash left ankle and top of the right foot.  Has been using cream.  Has not resolved.  Discussed referral to dermatology.  Past Medical History:  Diagnosis Date   Arthritis    Asthma    Complication of anesthesia    HARD TO WAKE UP   COPD (chronic obstructive pulmonary disease) ([place])    Crohn's disease ([place])     Depression    Diabetes mellitus ([place])    Fracture, foot    post fall   Hypercholesterolemia    Hypertension    IBS (irritable bowel syndrome)    Nephrolithiasis    s/p lithotripsy [facility])   Vitamin D deficiency    Past Surgical History:  Procedure Laterality Date   [place] EXCISION Left 09/04/2019   Procedure: EXCISION DUCTAL SYSTEM BREAST;  Surgeon: Robert Bellow, MD;  Location: [facility] ORS;  Service: General;  Laterality: Left;   BREAST EXCISIONAL BIOPSY Right 2013   benign   COLONOSCOPY  2014   COLONOSCOPY WITH PROPOFOL N/A 02/10/2020   Procedure: COLONOSCOPY WITH PROPOFOL;  Surgeon: Robert Bellow, MD;  Location: [facility] ENDOSCOPY;  Service: Endoscopy;  Laterality: N/A;   CORONARY STENT INTERVENTION N/A 03/07/2020   Procedure: CORONARY STENT INTERVENTION;  Surgeon: Isaias Cowman, MD;  Location: [place] [facility];  Service: Cardiovascular;  Laterality: N/A;   EXCISION / BIOPSY BREAST / NIPPLE / DUCT Left 2019   papilloma   EXCISION OF BREAST BIOPSY Left 2019   papilloma   LAPAROSCOPIC CHOLECYSTECTOMY  2005   Dr Tamala Julian   LAPAROSCOPIC SUPRACERVICAL HYSTERECTOMY  1985   left ovary not removed   LEFT HEART CATH AND CORONARY ANGIOGRAPHY N/A 03/07/2020  Procedure: LEFT HEART CATH AND CORONARY ANGIOGRAPHY;  Surgeon: Isaias Cowman, MD;  Location: [place] [facility];  Service: Cardiovascular;  Laterality: N/A;   LEFT HEART CATH AND CORONARY ANGIOGRAPHY N/A 03/10/2020   Procedure: LEFT HEART CATH AND CORONARY ANGIOGRAPHY and possible PCI and stent;  Surgeon: Yolonda Kida, MD;  Location: [place] [facility];  Service: Cardiovascular;  Laterality: N/A;   LITHOTRIPSY     x7   OOPHORECTOMY     POLYPECTOMY     SHOULDER SURGERY     right (pinning)   SHOULDER SURGERY     left (pinning)   TONSILLECTOMY  1966   Family History  Problem Relation Age of Onset   Heart disease Father     Emphysema Maternal Grandmother    COPD Mother    Hypercholesterolemia Mother    Emphysema Mother    Hypertension Brother    Emphysema Other        grandmother   COPD Sister    Crohn's disease Sister    Hypertension Sister    Breast cancer Neg Hx    Colon cancer Neg Hx    Social History   Socioeconomic History   Marital status: Married    Spouse name: Not on file   Number of children: 2   Years of education: Not on file   Highest education level: Not on file  Occupational History    Employer: [employer]  Tobacco Use   Smoking status: Former    Packs/day: 2.00    Years: 15.00    Pack years: 30.00    Types: Cigarettes    Quit date: 04/09/1997    Years since quitting: 23.6   Smokeless tobacco: Never  Vaping Use   Vaping Use: Never used  Substance and Sexual Activity   Alcohol use: Never    Alcohol/week: 0.0 standard drinks   Drug use: No   Sexual activity: Not on file  Other Topics Concern   Not on file  Social History Narrative   Not on file   Social Determinants of Health   Financial Resource Strain: Not on file  Food Insecurity: Not on file  Transportation Needs: Not on file  Physical Activity: Not on file  Stress: Not on file  Social Connections: Not on file     Review of Systems  Constitutional:  Negative for appetite change, fever and unexpected weight change.  HENT:  Negative for congestion and sinus pressure.   Respiratory:  Negative for cough, chest tightness and shortness of breath.   Cardiovascular:  Negative for chest pain, palpitations and leg swelling.  Gastrointestinal:  Negative for nausea and vomiting.       Some loose stool and bowel issues with augmentin.  Has two pills left.  Eating yogurt.   Genitourinary:  Negative for difficulty urinating and dysuria.  Musculoskeletal:  Negative for joint swelling and myalgias.  Neurological:  Negative for dizziness, light-headedness and headaches.  Psychiatric/Behavioral:  Negative for  agitation and dysphoric mood.       Objective:    Physical Exam Vitals reviewed.  Constitutional:      General: She is not in acute distress.    Appearance: Normal appearance.  HENT:     Head: Normocephalic and atraumatic.     Right Ear: External ear normal.     Left Ear: External ear normal.  Eyes:     General: No scleral icterus.       Right eye: No discharge.  Left eye: No discharge.     Conjunctiva/sclera: Conjunctivae normal.  Neck:     Thyroid: No thyromegaly.  Cardiovascular:     Rate and Rhythm: Normal rate and regular rhythm.  Pulmonary:     Effort: No respiratory distress.     Breath sounds: Normal breath sounds. No wheezing.  Abdominal:     General: Bowel sounds are normal.     Palpations: Abdomen is soft.     Tenderness: There is no abdominal tenderness.  Musculoskeletal:        General: No swelling or tenderness.     Cervical back: Neck supple. No tenderness.  Lymphadenopathy:     Cervical: No cervical adenopathy.  Skin:    Findings: No erythema.     Comments: Circular rash - top of right foot and left ankle - appears to be c/w ring worm.   Neurological:     Mental Status: She is alert.  Psychiatric:        Mood and Affect: Mood normal.        Behavior: Behavior normal.    BP 132/76   Pulse 64   Temp 98 F (36.7 C)   Resp 16   Ht _0  (1.676 m)   Wt 217 lb 3.2 oz (98.5 kg)   SpO2 97%   BMI 35.06 kg/m  Wt Readings from Last 3 Encounters:  11/29/20 217 lb 3.2 oz (98.5 kg)  11/16/20 216 lb 0.8 oz (98 kg)  08/12/20 221 lb 3.2 oz (100.3 kg)    Outpatient Encounter Medications as of 11/29/2020  Medication Sig   insulin glargine (LANTUS SOLOSTAR) 100 UNIT/ML Solostar Pen Inject 26 Units into the skin at bedtime.   acetaminophen (TYLENOL) 325 MG tablet Take 2 tablets (650 mg total) by mouth every 6 (six) hours as needed for mild pain (or Fever >/= 101).   albuterol (PROVENTIL) (2.5 MG/3ML) 0.083% nebulizer solution Take 2.5 mg by  nebulization every 6 (six) hours as needed for wheezing.   amLODipine (NORVASC) 5 MG tablet Take 1 tablet by mouth daily.   aspirin 81 MG chewable tablet Chew 1 tablet by mouth daily.   buPROPion (WELLBUTRIN XL) 300 MG 24 hr tablet Take 1 tablet (300 mg total) by mouth daily. Take 1 tablet (300 mg total) by mouth daily.   clotrimazole-betamethasone (LOTRISONE) cream Apply 1 application topically 2 (two) times daily.   fluconazole (DIFLUCAN) 150 MG tablet Take one tablet x 1 day and then may repeat x 1 in three days if not improved.   fluticasone-salmeterol (ADVAIR HFA) 230-21 MCG/ACT inhaler Inhale 2 puffs into the lungs 2 (two) times daily.   FREESTYLE LITE test strip    hyoscyamine (LEVSIN SL) 0.125 MG SL tablet Place 0.125 mg under the tongue as needed.   ipratropium (ATROVENT) 0.06 % nasal spray Place 2 sprays into both nostrils 3 (three) times daily as needed for rhinitis.   Ipratropium-Albuterol (COMBIVENT) 20-100 MCG/ACT AERS respimat Inhale 2 puffs into the lungs every 4 (four) hours while awake. (Patient taking differently: Inhale 1 puff into the lungs 2 (two) times daily. And as needed as a rescue inhaler.)   isosorbide mononitrate (IMDUR) 30 MG 24 hr tablet Take 1 tablet (30 mg total) by mouth daily.   JARDIANCE 25 MG TABS tablet Take 25 mg by mouth daily.   LORazepam (ATIVAN) 0.5 MG tablet    losartan (COZAAR) 25 MG tablet Take 1 tablet (25 mg total) by mouth daily.   metFORMIN (GLUCOPHAGE) 500 MG tablet  Take 2 tablets (1,000 mg total) by mouth 2 (two) times daily.   metoprolol tartrate (LOPRESSOR) 25 MG tablet Take 1 tablet (25 mg total) by mouth 2 (two) times daily.   nitroGLYCERIN (NITROSTAT) 0.4 MG SL tablet Place 1 tablet (0.4 mg total) under the tongue every 5 (five) minutes as needed for chest pain.   prasugrel (EFFIENT) 10 MG TABS tablet Take 1 tablet (10 mg total) by mouth daily.   RABEprazole (ACIPHEX) 20 MG tablet Take 1 tablet (20 mg total) by mouth in the morning and at  bedtime.   rosuvastatin (CRESTOR) 5 MG tablet Take 1 tablet (5 mg total) by mouth daily.   TRULICITY 3 GL/8.7FI SOPN Inject 3 mg into the skin once a week.   ULTRACARE PEN NEEDLES 32G X 4 MM MISC    vitamin C (VITAMIN C) 1000 MG tablet Take 1 tablet (1,000 mg total) by mouth daily.   [DISCONTINUED] Dulaglutide (TRULICITY) 1.5 EP/3.2RJ SOPN Inject 1.5 mg into the skin once a week. ON SUNDAYS   [DISCONTINUED] insulin glargine (LANTUS) 100 UNIT/ML injection Inject 20 Units into the skin at bedtime.    No facility-administered encounter medications on file as of 11/29/2020.     Lab Results  Component Value Date   WBC 10.1 09/02/2020   HGB 14.8 09/02/2020   HCT 44.9 09/02/2020   PLT 177.0 09/02/2020   GLUCOSE 126 (H) 09/02/2020   CHOL 144 09/02/2020   TRIG 171.0 (H) 09/02/2020   HDL 52.70 09/02/2020   LDLDIRECT 149.0 02/12/2020   LDLCALC 57 09/02/2020   ALT 16 09/02/2020   AST 12 09/02/2020   NA 140 09/02/2020   K 4.3 09/02/2020   CL 103 09/02/2020   CREATININE 0.79 09/02/2020   BUN 17 09/02/2020   CO2 27 09/02/2020   TSH 1.34 09/02/2020   INR 0.9 03/06/2020   HGBA1C 7.9 (H) 09/02/2020   MICROALBUR 0.7 06/02/2019       Assessment & Plan:   Problem List Items Addressed This Visit     Asthma    Continue advair and singulair.  Has not needed rescue inhaler.  Follow.        CAD (coronary artery disease), native coronary artery    S/p recent NSTEMI.  S/p PCI-LAD.  Continue effiant, aspirin, crestor and metoprolol.  Stable.        Crohn's disease ([place])    Bowels stable.       Diabetes mellitus ([place])    Followed by Dr Gabriel Carina.  trulicity and lantus have been increased.  Sugars as outlined.  Follow met b and a1c.       Relevant Medications   TRULICITY 3 JO/8.4ZY SOPN   insulin glargine (LANTUS SOLOSTAR) 100 UNIT/ML Solostar Pen   Other Relevant Orders   Hemoglobin A1c   Dog bite    Complete augmentin.  Steri strips removed.  Healing.  Follow.  Tetanus given at ER.        GERD (gastroesophageal reflux disease)    Continue aciphex. No upper symptoms reported.       Hypercholesterolemia without hypertriglyceridemia    Continue crestor.  Low cholesterol diet and exercise.  Follow lipid panel and liver function tests.        Relevant Orders   Hepatic function panel   Lipid panel   Hypertension - Primary    Continue metoprolol, imdur and amlodipine.  Blood pressure doing well.  Follow pressures.  Follow metabolic panel.       Relevant Orders  Basic metabolic panel   Mild depression ([facility])    Continue wellbutrin.  Increased stress.  Discussed.  Does not feel needs any further intervention at this time.  Follow.        Obesity, diabetes, and hypertension syndrome ([facility])    Diet, exercise and weight loss.       Relevant Medications   TRULICITY 3 TZ/0.0FV SOPN   insulin glargine (LANTUS SOLOSTAR) 100 UNIT/ML Solostar Pen   Polycythemia vera ([place])    Has seen hematology.  Follow cbc.       Rash    Persistent rash as outlined.  Not responding to topical treatment.  Refer to dermatology for further treatment.       Relevant Orders   Ambulatory referral to Dermatology     Einar Pheasant, MD

## 2020-11-30 NOTE — Telephone Encounter (Signed)
Order placed for dermatology referral.

## 2020-11-30 NOTE — Assessment & Plan Note (Signed)
Persistent rash as outlined.  Not responding to topical treatment.  Refer to dermatology for further treatment.

## 2020-12-01 ENCOUNTER — Encounter: Payer: Self-pay | Admitting: Internal Medicine

## 2020-12-01 DIAGNOSIS — W540XXA Bitten by dog, initial encounter: Secondary | ICD-10-CM | POA: Insufficient documentation

## 2020-12-01 NOTE — Assessment & Plan Note (Signed)
Followed by Dr Gabriel Carina.  trulicity and lantus have been increased.  Sugars as outlined.  Follow met b and a1c.

## 2020-12-01 NOTE — Assessment & Plan Note (Signed)
S/p recent NSTEMI.  S/p PCI-LAD.  Continue effiant, aspirin, crestor and metoprolol.  Stable.

## 2020-12-01 NOTE — Assessment & Plan Note (Signed)
Continue aciphex. No upper symptoms reported.

## 2020-12-01 NOTE — Assessment & Plan Note (Signed)
Continue wellbutrin.  Increased stress.  Discussed.  Does not feel needs any further intervention at this time.  Follow.

## 2020-12-01 NOTE — Assessment & Plan Note (Signed)
Continue advair and singulair.  Has not needed rescue inhaler.  Follow.

## 2020-12-01 NOTE — Assessment & Plan Note (Signed)
Bowels stable.

## 2020-12-01 NOTE — Assessment & Plan Note (Signed)
Diet, exercise and weight loss.

## 2020-12-01 NOTE — Assessment & Plan Note (Signed)
Continue metoprolol, imdur and amlodipine.  Blood pressure doing well.  Follow pressures.  Follow metabolic panel.

## 2020-12-01 NOTE — Assessment & Plan Note (Signed)
Complete augmentin.  Steri strips removed.  Healing.  Follow.  Tetanus given at ER.

## 2020-12-01 NOTE — Assessment & Plan Note (Signed)
Has seen hematology.  Follow cbc.

## 2020-12-01 NOTE — Assessment & Plan Note (Signed)
Continue crestor.  Low cholesterol diet and exercise.  Follow lipid panel and liver function tests.

## 2020-12-15 ENCOUNTER — Ambulatory Visit: Payer: Medicare Other | Admitting: Internal Medicine

## 2020-12-27 ENCOUNTER — Telehealth: Payer: Self-pay | Admitting: Internal Medicine

## 2020-12-27 NOTE — Telephone Encounter (Signed)
Please call and obtain copy of xray report for me to review.  Hold message until we receive.  Also, confirm if pt aware of results (some plaque in carotids noted incidentally on neck xray) and confirm ok with scheduling a carotid ultrasound.

## 2020-12-27 NOTE — Telephone Encounter (Signed)
Called patient. Unable to leave message. Still waiting on xray report.

## 2020-12-27 NOTE — Telephone Encounter (Signed)
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 The request is for the patient to be seen sooner by her PCP,  doppler ultrasound  and referral to Cardiology. They will fax over the radiology results.

## 2020-12-27 NOTE — Telephone Encounter (Signed)
Called and spoke with MJ at [facility] in. Advised I still have not received x-ray report. She is resending.

## 2020-12-28 ENCOUNTER — Other Ambulatory Visit: Payer: Self-pay | Admitting: Internal Medicine

## 2020-12-28 DIAGNOSIS — R928 Other abnormal and inconclusive findings on diagnostic imaging of breast: Secondary | ICD-10-CM

## 2020-12-28 NOTE — Telephone Encounter (Signed)
X-ray report received. Given to Dr Nicki Reaper for our records.

## 2020-12-28 NOTE — Telephone Encounter (Signed)
Reviewed.  Pt scheduled to follow up with cardiology for f/u carotid ultrasound.  Please call and confirm pt doing ok.

## 2020-12-28 NOTE — Addendum Note (Signed)
Addended by: Alisa Graff on: 12/28/2020 04:23 PM   Modules accepted: Orders

## 2020-12-28 NOTE — Telephone Encounter (Signed)
Spoke with Amanda Wiley. Xray report still not received. Received cover sheet. Was informed by Amanda Wiley that Dr Ubaldo Glassing has gotten involved and is planning to see patient this week for doppler ultrasounds, etc. Patient is aware of this.

## 2020-12-28 NOTE — Progress Notes (Addendum)
Opened in error

## 2020-12-30 NOTE — Telephone Encounter (Signed)
Spoke with patient. She is doing ok. She sees Dr Ubaldo Glassing 10/6. Patient says that she is feeling good and thanked me for calling to check on her. Will keep [place] updated.

## 2021-01-23 ENCOUNTER — Other Ambulatory Visit: Payer: Self-pay

## 2021-01-23 ENCOUNTER — Other Ambulatory Visit (INDEPENDENT_AMBULATORY_CARE_PROVIDER_SITE_OTHER): Payer: Medicare Other

## 2021-01-23 DIAGNOSIS — E1159 Type 2 diabetes mellitus with other circulatory complications: Secondary | ICD-10-CM

## 2021-01-23 DIAGNOSIS — I10 Essential (primary) hypertension: Secondary | ICD-10-CM

## 2021-01-23 DIAGNOSIS — Z794 Long term (current) use of insulin: Secondary | ICD-10-CM

## 2021-01-23 DIAGNOSIS — E7800 Pure hypercholesterolemia, unspecified: Secondary | ICD-10-CM

## 2021-01-23 LAB — LIPID PANEL
Cholesterol: 141 mg/dL (ref 0–200)
HDL: 45 mg/dL (ref >39.00)
NonHDL: 95.98
Total CHOL/HDL Ratio: 3
Triglycerides: 202 mg/dL — ABNORMAL HIGH (ref 0.0–149.0)
VLDL: 40.4 mg/dL — ABNORMAL HIGH (ref 0.0–40.0)

## 2021-01-23 LAB — BASIC METABOLIC PANEL
BUN: 11 mg/dL (ref 6–23)
CO2: 23 mEq/L (ref 19–32)
Calcium: 8.8 mg/dL (ref 8.4–10.5)
Chloride: 107 mEq/L (ref 96–112)
Creatinine, Ser: 0.84 mg/dL (ref 0.40–1.20)
GFR: 72.63 mL/min (ref >60.00)
Glucose, Bld: 138 mg/dL — ABNORMAL HIGH (ref 70–99)
Potassium: 4.2 mEq/L (ref 3.5–5.1)
Sodium: 141 mEq/L (ref 135–145)

## 2021-01-23 LAB — LDL CHOLESTEROL, DIRECT: Direct LDL: 78 mg/dL

## 2021-01-23 LAB — HEPATIC FUNCTION PANEL
ALT: 22 U/L (ref 0–35)
AST: 17 U/L (ref 0–37)
Albumin: 4 g/dL (ref 3.5–5.2)
Alkaline Phosphatase: 46 U/L (ref 39–117)
Bilirubin, Direct: 0.1 mg/dL (ref 0.0–0.3)
Total Bilirubin: 0.4 mg/dL (ref 0.2–1.2)
Total Protein: 6.5 g/dL (ref 6.0–8.3)

## 2021-01-23 LAB — HEMOGLOBIN A1C: Hgb A1c MFr Bld: 7.4 % — ABNORMAL HIGH (ref 4.6–6.5)

## 2021-02-20 LAB — HM DIABETES EYE EXAM

## 2021-03-13 ENCOUNTER — Encounter: Payer: Self-pay | Admitting: Internal Medicine

## 2021-03-13 ENCOUNTER — Other Ambulatory Visit: Payer: Self-pay

## 2021-03-13 ENCOUNTER — Ambulatory Visit (INDEPENDENT_AMBULATORY_CARE_PROVIDER_SITE_OTHER): Payer: Medicare Other | Admitting: Internal Medicine

## 2021-03-13 DIAGNOSIS — E669 Obesity, unspecified: Secondary | ICD-10-CM

## 2021-03-13 DIAGNOSIS — K5090 Crohn's disease, unspecified, without complications: Secondary | ICD-10-CM

## 2021-03-13 DIAGNOSIS — R7989 Other specified abnormal findings of blood chemistry: Secondary | ICD-10-CM

## 2021-03-13 DIAGNOSIS — E1159 Type 2 diabetes mellitus with other circulatory complications: Secondary | ICD-10-CM

## 2021-03-13 DIAGNOSIS — I25119 Atherosclerotic heart disease of native coronary artery with unspecified angina pectoris: Secondary | ICD-10-CM | POA: Diagnosis not present

## 2021-03-13 DIAGNOSIS — I152 Hypertension secondary to endocrine disorders: Secondary | ICD-10-CM

## 2021-03-13 DIAGNOSIS — Z0000 Encounter for general adult medical examination without abnormal findings: Secondary | ICD-10-CM

## 2021-03-13 DIAGNOSIS — E1169 Type 2 diabetes mellitus with other specified complication: Secondary | ICD-10-CM

## 2021-03-13 DIAGNOSIS — K219 Gastro-esophageal reflux disease without esophagitis: Secondary | ICD-10-CM

## 2021-03-13 DIAGNOSIS — I10 Essential (primary) hypertension: Secondary | ICD-10-CM | POA: Diagnosis not present

## 2021-03-13 DIAGNOSIS — D45 Polycythemia vera: Secondary | ICD-10-CM

## 2021-03-13 DIAGNOSIS — F32A Depression, unspecified: Secondary | ICD-10-CM

## 2021-03-13 DIAGNOSIS — J4520 Mild intermittent asthma, uncomplicated: Secondary | ICD-10-CM

## 2021-03-13 DIAGNOSIS — E7800 Pure hypercholesterolemia, unspecified: Secondary | ICD-10-CM

## 2021-03-13 DIAGNOSIS — Z794 Long term (current) use of insulin: Secondary | ICD-10-CM

## 2021-03-13 LAB — HM DIABETES FOOT EXAM

## 2021-03-13 MED ORDER — HYOSCYAMINE SULFATE 0.125 MG SL SUBL
0.1250 mg | SUBLINGUAL_TABLET | Freq: Every day | SUBLINGUAL | 0 refills | Status: DC | PRN
Start: 2021-03-13 — End: 2021-04-20

## 2021-03-13 NOTE — Assessment & Plan Note (Addendum)
Physical today 03/13/21.  PAP 12/2016 - negative with negative HPV.  Colonoscopy 02/2020.  Mammogram 08/05/20 - birads I.

## 2021-03-13 NOTE — Progress Notes (Signed)
[name], female   DOB: 02-02-1956, 65 y.o.   MRN: [id number]   Subjective:    [name], female    DOB: [birth year], 65 y.o.   MRN: [id number]  This visit occurred during the SARS-CoV-2 public health emergency.  Safety protocols were in place, including screening questions prior to the visit, additional usage of staff PPE, and extensive cleaning of exam room while observing appropriate contact time as indicated for disinfecting solutions.  Marland Kitchen   HPI With past history of diabetes, hypercholesterolemia and hypertension.  She comes in today to follow up on these issues as well as for a complete physical exam.  Sees Dr Gabriel Carina for f/u diabetes.  Had retinal scan 02/20/21 - ok.  S/p NSTEMI 03/07/20.  S/p cardiac cath - 90% mid LAD lesion, 20% proximal rCA, 40% distal LD with overlapping drug eluting stents.  S/p PCI with relook cath - patent stents in the LAD.  Also recommended carotid ultrasound.  No chest pain reported.  Breathing stable.  No abdominal pain or bowel change reported.  Handling stress.     Past Medical History:  Diagnosis Date   Arthritis    Asthma    Complication of anesthesia    HARD TO WAKE UP   COPD (chronic obstructive pulmonary disease) ([place])    Crohn's disease ([place])    Depression    Diabetes mellitus ([place])    Fracture, foot    post fall   Hypercholesterolemia    Hypertension    IBS (irritable bowel syndrome)    Nephrolithiasis    s/p lithotripsy [facility])   Vitamin D deficiency    Past Surgical History:  Procedure Laterality Date   [place] EXCISION Left 09/04/2019   Procedure: EXCISION DUCTAL SYSTEM BREAST;  Surgeon: Robert Bellow, MD;  Location: [facility] ORS;  Service: General;  Laterality: Left;   BREAST EXCISIONAL BIOPSY Right 2013   benign   COLONOSCOPY  2014   COLONOSCOPY WITH PROPOFOL N/A 02/10/2020   Procedure: COLONOSCOPY WITH PROPOFOL;   Surgeon: Robert Bellow, MD;  Location: [facility] ENDOSCOPY;  Service: Endoscopy;  Laterality: N/A;   CORONARY STENT INTERVENTION N/A 03/07/2020   Procedure: CORONARY STENT INTERVENTION;  Surgeon: Isaias Cowman, MD;  Location: [place] [facility];  Service: Cardiovascular;  Laterality: N/A;   EXCISION / BIOPSY BREAST / NIPPLE / DUCT Left 2019   papilloma   EXCISION OF BREAST BIOPSY Left 2019   papilloma   LAPAROSCOPIC CHOLECYSTECTOMY  2005   Dr Tamala Julian   LAPAROSCOPIC SUPRACERVICAL HYSTERECTOMY  1985   left ovary not removed   LEFT HEART CATH AND CORONARY ANGIOGRAPHY N/A 03/07/2020   Procedure: LEFT HEART CATH AND CORONARY ANGIOGRAPHY;  Surgeon: Isaias Cowman, MD;  Location: [place] [facility];  Service: Cardiovascular;  Laterality: N/A;   LEFT HEART CATH AND CORONARY ANGIOGRAPHY N/A 03/10/2020   Procedure: LEFT HEART CATH AND CORONARY ANGIOGRAPHY and possible PCI and stent;  Surgeon: Yolonda Kida, MD;  Location: [place] [facility];  Service: Cardiovascular;  Laterality: N/A;   LITHOTRIPSY     x7   OOPHORECTOMY     POLYPECTOMY     SHOULDER SURGERY     right (pinning)   SHOULDER SURGERY     left (pinning)   TONSILLECTOMY  1966   Family History  Problem Relation Age of Onset   Heart disease Father    Emphysema Maternal  Grandmother    COPD Mother    Hypercholesterolemia Mother    Emphysema Mother    Hypertension Brother    Emphysema Other        grandmother   COPD Sister    Crohn's disease Sister    Hypertension Sister    Breast cancer Neg Hx    Colon cancer Neg Hx    Social History   Socioeconomic History   Marital status: Married    Spouse name: Not on file   Number of children: 2   Years of education: Not on file   Highest education level: Not on file  Occupational History    Employer: [employer]  Tobacco Use   Smoking status: Former    Packs/day: 2.00    Years: 15.00    Pack years: 30.00    Types: Cigarettes    Quit date: 04/09/1997     Years since quitting: 23.9   Smokeless tobacco: Never  Vaping Use   Vaping Use: Never used  Substance and Sexual Activity   Alcohol use: Never    Alcohol/week: 0.0 standard drinks   Drug use: No   Sexual activity: Not on file  Other Topics Concern   Not on file  Social History Narrative   Not on file   Social Determinants of Health   Financial Resource Strain: Not on file  Food Insecurity: Not on file  Transportation Needs: Not on file  Physical Activity: Not on file  Stress: Not on file  Social Connections: Not on file     Review of Systems  Constitutional:  Negative for appetite change and unexpected weight change.  HENT:  Negative for congestion, sinus pressure and sore throat.   Eyes:  Negative for pain and visual disturbance.  Respiratory:  Negative for cough, chest tightness and shortness of breath.   Cardiovascular:  Negative for chest pain and palpitations.  Gastrointestinal:  Negative for abdominal pain, diarrhea, nausea and vomiting.  Genitourinary:  Negative for difficulty urinating and dysuria.  Musculoskeletal:  Negative for joint swelling and myalgias.  Skin:  Negative for color change and rash.  Neurological:  Negative for dizziness, light-headedness and headaches.  Hematological:  Negative for adenopathy. Does not bruise/bleed easily.  Psychiatric/Behavioral:  Negative for agitation and dysphoric mood.       Objective:     BP 136/82   Pulse 78   Temp 98.6 F (37 C) (Temporal)   Ht _0  (1.676 m)   Wt 208 lb (94.3 kg)   SpO2 93%   BMI 33.57 kg/m  Wt Readings from Last 3 Encounters:  03/13/21 208 lb (94.3 kg)  11/29/20 217 lb 3.2 oz (98.5 kg)  11/16/20 216 lb 0.8 oz (98 kg)    Physical Exam Vitals reviewed.  Constitutional:      General: She is not in acute distress.    Appearance: Normal appearance. She is well-developed.  HENT:     Head: Normocephalic and atraumatic.     Right Ear: External ear normal.     Left Ear: External ear  normal.  Eyes:     General: No scleral icterus.       Right eye: No discharge.        Left eye: No discharge.     Conjunctiva/sclera: Conjunctivae normal.  Neck:     Thyroid: No thyromegaly.  Cardiovascular:     Rate and Rhythm: Normal rate and regular rhythm.  Pulmonary:     Effort: No tachypnea, accessory muscle usage or  respiratory distress.     Breath sounds: Normal breath sounds. No decreased breath sounds or wheezing.  Chest:  Breasts:    Right: No inverted nipple, mass, nipple discharge or tenderness (no axillary adenopathy).     Left: No inverted nipple, mass, nipple discharge or tenderness (no axilarry adenopathy).  Abdominal:     General: Bowel sounds are normal.     Palpations: Abdomen is soft.     Tenderness: There is no abdominal tenderness.  Musculoskeletal:        General: No swelling or tenderness.     Cervical back: Neck supple. No tenderness.  Lymphadenopathy:     Cervical: No cervical adenopathy.  Skin:    Findings: No erythema or rash.  Neurological:     Mental Status: She is alert and oriented to person, place, and time.  Psychiatric:        Mood and Affect: Mood normal.        Behavior: Behavior normal.     Outpatient Encounter Medications as of 03/13/2021  Medication Sig   acetaminophen (TYLENOL) 325 MG tablet Take 2 tablets (650 mg total) by mouth every 6 (six) hours as needed for mild pain (or Fever >/= 101).   albuterol (PROVENTIL) (2.5 MG/3ML) 0.083% nebulizer solution Take 2.5 mg by nebulization every 6 (six) hours as needed for wheezing.   amLODipine (NORVASC) 5 MG tablet Take 1 tablet by mouth daily.   aspirin 81 MG chewable tablet Chew 1 tablet by mouth daily.   buPROPion (WELLBUTRIN XL) 300 MG 24 hr tablet Take 1 tablet (300 mg total) by mouth daily. Take 1 tablet (300 mg total) by mouth daily.   clotrimazole-betamethasone (LOTRISONE) cream Apply 1 application topically 2 (two) times daily.   fluticasone-salmeterol (ADVAIR HFA) 230-21 MCG/ACT  inhaler Inhale 2 puffs into the lungs 2 (two) times daily.   FREESTYLE LITE test strip    insulin glargine (LANTUS SOLOSTAR) 100 UNIT/ML Solostar Pen Inject 26 Units into the skin at bedtime.   ipratropium (ATROVENT) 0.06 % nasal spray Place 2 sprays into both nostrils 3 (three) times daily as needed for rhinitis.   Ipratropium-Albuterol (COMBIVENT) 20-100 MCG/ACT AERS respimat Inhale 2 puffs into the lungs every 4 (four) hours while awake. (Patient taking differently: Inhale 1 puff into the lungs 2 (two) times daily. And as needed as a rescue inhaler.)   isosorbide mononitrate (IMDUR) 30 MG 24 hr tablet Take 1 tablet (30 mg total) by mouth daily.   JARDIANCE 25 MG TABS tablet Take 25 mg by mouth daily.   LORazepam (ATIVAN) 0.5 MG tablet    losartan (COZAAR) 25 MG tablet Take 1 tablet (25 mg total) by mouth daily.   metFORMIN (GLUCOPHAGE) 500 MG tablet Take 2 tablets (1,000 mg total) by mouth 2 (two) times daily.   metoprolol tartrate (LOPRESSOR) 25 MG tablet Take 1 tablet (25 mg total) by mouth 2 (two) times daily.   nitroGLYCERIN (NITROSTAT) 0.4 MG SL tablet Place 1 tablet (0.4 mg total) under the tongue every 5 (five) minutes as needed for chest pain.   prasugrel (EFFIENT) 10 MG TABS tablet Take 1 tablet (10 mg total) by mouth daily.   RABEprazole (ACIPHEX) 20 MG tablet Take 1 tablet (20 mg total) by mouth in the morning and at bedtime.   rosuvastatin (CRESTOR) 5 MG tablet Take 1 tablet (5 mg total) by mouth daily.   TRULICITY 3 MW/4.1LK SOPN Inject 3 mg into the skin once a week.   ULTRACARE PEN NEEDLES 32G X  4 MM MISC    [DISCONTINUED] hyoscyamine (LEVSIN SL) 0.125 MG SL tablet Place 0.125 mg under the tongue as needed.   hyoscyamine (LEVSIN SL) 0.125 MG SL tablet Place 1 tablet (0.125 mg total) under the tongue daily as needed.   [DISCONTINUED] fluconazole (DIFLUCAN) 150 MG tablet Take one tablet x 1 day and then may repeat x 1 in three days if not improved.   [DISCONTINUED] vitamin C  (VITAMIN C) 1000 MG tablet Take 1 tablet (1,000 mg total) by mouth daily.   No facility-administered encounter medications on file as of 03/13/2021.     Lab Results  Component Value Date   WBC 10.1 09/02/2020   HGB 14.8 09/02/2020   HCT 44.9 09/02/2020   PLT 177.0 09/02/2020   GLUCOSE 138 (H) 01/23/2021   CHOL 141 01/23/2021   TRIG 202.0 (H) 01/23/2021   HDL 45.00 01/23/2021   LDLDIRECT 78.0 01/23/2021   LDLCALC 57 09/02/2020   ALT 22 01/23/2021   AST 17 01/23/2021   NA 141 01/23/2021   K 4.2 01/23/2021   CL 107 01/23/2021   CREATININE 0.84 01/23/2021   BUN 11 01/23/2021   CO2 23 01/23/2021   TSH 1.34 09/02/2020   INR 0.9 03/06/2020   HGBA1C 7.4 (H) 01/23/2021   MICROALBUR 0.7 06/02/2019       Assessment & Plan:   Problem List Items Addressed This Visit     Abnormal liver function tests    Diet and exercise.  Follow liver function tests.       Asthma    Continue advair and singulair.  Follow.        CAD (coronary artery disease), native coronary artery    S/p recent NSTEMI.  S/p PCI-LAD.  Continue effiant, aspirin, crestor and metoprolol.  Stable.        Crohn's disease ([place])    Bowels stable.       Diabetes mellitus (HCC)    Recent a1c 7.5.  Followed by Dr Gabriel Carina.   Low carb diet and exercise.  Follow met b and a1c.        Relevant Orders   Basic metabolic panel   Hemoglobin A1c   GERD (gastroesophageal reflux disease)    Continue aciphex. No upper symptoms reported.       Relevant Medications   hyoscyamine (LEVSIN SL) 0.125 MG SL tablet   Health care maintenance    Physical today 03/13/21.  PAP 12/2016 - negative with negative HPV.  Colonoscopy 02/2020.  Mammogram 08/05/20 - birads I.       Hypercholesterolemia without hypertriglyceridemia    Continue crestor.  Low cholesterol diet and exercise.  Follow lipid panel and liver function tests.        Relevant Orders   Hepatic function panel   Lipid panel   Hypertension    Continue metoprolol,  imdur and amlodipine.  Blood pressure doing well.  Follow pressures.  Follow metabolic panel.       Mild depression (HCC)    Continue wellbutrin.  Does not feel needs any further intervention at this time.  Follow.        Obesity, diabetes, and hypertension syndrome (HCC)    Diet, exercise and weight loss.       Polycythemia vera ([place])    Has seen hematology.  Follow cbc.       Relevant Orders   CBC with Differential/Platelet     Einar Pheasant, MD

## 2021-03-18 ENCOUNTER — Encounter: Payer: Self-pay | Admitting: Internal Medicine

## 2021-03-18 NOTE — Assessment & Plan Note (Signed)
Diet, exercise and weight loss.

## 2021-03-18 NOTE — Assessment & Plan Note (Signed)
Continue metoprolol, imdur and amlodipine.  Blood pressure doing well.  Follow pressures.  Follow metabolic panel.

## 2021-03-18 NOTE — Assessment & Plan Note (Signed)
Continue advair and singulair.  Follow.

## 2021-03-18 NOTE — Assessment & Plan Note (Signed)
S/p recent NSTEMI.  S/p PCI-LAD.  Continue effiant, aspirin, crestor and metoprolol.  Stable.

## 2021-03-19 NOTE — Assessment & Plan Note (Signed)
Continue wellbutrin.  Does not feel needs any further intervention at this time.  Follow.

## 2021-03-19 NOTE — Assessment & Plan Note (Signed)
Diet and exercise.  Follow liver function tests.

## 2021-03-19 NOTE — Assessment & Plan Note (Signed)
Continue aciphex. No upper symptoms reported.

## 2021-03-19 NOTE — Assessment & Plan Note (Signed)
Has seen hematology.  Follow cbc.

## 2021-03-19 NOTE — Assessment & Plan Note (Signed)
Bowels stable.

## 2021-03-19 NOTE — Assessment & Plan Note (Signed)
Recent a1c 7.5.  Followed by Dr Gabriel Carina.   Low carb diet and exercise.  Follow met b and a1c.

## 2021-03-19 NOTE — Assessment & Plan Note (Signed)
Continue crestor.  Low cholesterol diet and exercise.  Follow lipid panel and liver function tests.

## 2021-04-06 ENCOUNTER — Ambulatory Visit: Payer: Medicare Other | Admitting: Dermatology

## 2021-04-19 ENCOUNTER — Other Ambulatory Visit: Payer: Self-pay | Admitting: Internal Medicine

## 2021-05-21 ENCOUNTER — Other Ambulatory Visit: Payer: Self-pay | Admitting: Internal Medicine

## 2021-06-05 ENCOUNTER — Other Ambulatory Visit: Payer: Self-pay | Admitting: Internal Medicine

## 2021-06-22 ENCOUNTER — Other Ambulatory Visit: Payer: Self-pay

## 2021-06-22 ENCOUNTER — Other Ambulatory Visit (INDEPENDENT_AMBULATORY_CARE_PROVIDER_SITE_OTHER): Payer: Medicare Other

## 2021-06-22 DIAGNOSIS — Z794 Long term (current) use of insulin: Secondary | ICD-10-CM | POA: Diagnosis not present

## 2021-06-22 DIAGNOSIS — D45 Polycythemia vera: Secondary | ICD-10-CM | POA: Diagnosis not present

## 2021-06-22 DIAGNOSIS — E1159 Type 2 diabetes mellitus with other circulatory complications: Secondary | ICD-10-CM | POA: Diagnosis not present

## 2021-06-22 DIAGNOSIS — E7800 Pure hypercholesterolemia, unspecified: Secondary | ICD-10-CM | POA: Diagnosis not present

## 2021-06-22 LAB — HEPATIC FUNCTION PANEL
ALT: 17 U/L (ref 0–35)
AST: 15 U/L (ref 0–37)
Albumin: 4.1 g/dL (ref 3.5–5.2)
Alkaline Phosphatase: 48 U/L (ref 39–117)
Bilirubin, Direct: 0 mg/dL (ref 0.0–0.3)
Total Bilirubin: 0.4 mg/dL (ref 0.2–1.2)
Total Protein: 7.1 g/dL (ref 6.0–8.3)

## 2021-06-22 LAB — HEMOGLOBIN A1C: Hgb A1c MFr Bld: 7.6 % — ABNORMAL HIGH (ref 4.6–6.5)

## 2021-06-22 LAB — CBC WITH DIFFERENTIAL/PLATELET
Basophils Absolute: 0.1 10*3/uL (ref 0.0–0.1)
Basophils Relative: 1 % (ref 0.0–3.0)
Eosinophils Absolute: 0.3 10*3/uL (ref 0.0–0.7)
Eosinophils Relative: 3.6 % (ref 0.0–5.0)
HCT: 45.5 % (ref 36.0–46.0)
Hemoglobin: 14.8 g/dL (ref 12.0–15.0)
Lymphocytes Relative: 27.3 % (ref 12.0–46.0)
Lymphs Abs: 2.3 10*3/uL (ref 0.7–4.0)
MCHC: 32.6 g/dL (ref 30.0–36.0)
MCV: 83.9 fl (ref 78.0–100.0)
Monocytes Absolute: 1 10*3/uL (ref 0.1–1.0)
Monocytes Relative: 11.7 % (ref 3.0–12.0)
Neutro Abs: 4.7 10*3/uL (ref 1.4–7.7)
Neutrophils Relative %: 56.4 % (ref 43.0–77.0)
Platelets: 178 10*3/uL (ref 150.0–400.0)
RBC: 5.42 Mil/uL — ABNORMAL HIGH (ref 3.87–5.11)
RDW: 16.8 % — ABNORMAL HIGH (ref 11.5–15.5)
WBC: 8.3 10*3/uL (ref 4.0–10.5)

## 2021-06-22 LAB — LIPID PANEL
Cholesterol: 122 mg/dL (ref 0–200)
HDL: 49.5 mg/dL (ref >39.00)
LDL Cholesterol: 38 mg/dL (ref 0–99)
NonHDL: 72.55
Total CHOL/HDL Ratio: 2
Triglycerides: 173 mg/dL — ABNORMAL HIGH (ref 0.0–149.0)
VLDL: 34.6 mg/dL (ref 0.0–40.0)

## 2021-06-22 LAB — BASIC METABOLIC PANEL
BUN: 11 mg/dL (ref 6–23)
CO2: 29 mEq/L (ref 19–32)
Calcium: 9.4 mg/dL (ref 8.4–10.5)
Chloride: 104 mEq/L (ref 96–112)
Creatinine, Ser: 0.71 mg/dL (ref 0.40–1.20)
GFR: 88.61 mL/min (ref >60.00)
Glucose, Bld: 111 mg/dL — ABNORMAL HIGH (ref 70–99)
Potassium: 3.9 mEq/L (ref 3.5–5.1)
Sodium: 141 mEq/L (ref 135–145)

## 2021-06-27 ENCOUNTER — Ambulatory Visit (INDEPENDENT_AMBULATORY_CARE_PROVIDER_SITE_OTHER): Payer: Medicare Other | Admitting: Internal Medicine

## 2021-06-27 ENCOUNTER — Other Ambulatory Visit: Payer: Self-pay

## 2021-06-27 VITALS — BP 130/72 | HR 83 | Temp 97.9°F | Resp 16 | Ht 65.5 in | Wt 223.0 lb

## 2021-06-27 DIAGNOSIS — E1169 Type 2 diabetes mellitus with other specified complication: Secondary | ICD-10-CM | POA: Diagnosis not present

## 2021-06-27 DIAGNOSIS — E7800 Pure hypercholesterolemia, unspecified: Secondary | ICD-10-CM

## 2021-06-27 DIAGNOSIS — I152 Hypertension secondary to endocrine disorders: Secondary | ICD-10-CM

## 2021-06-27 DIAGNOSIS — K760 Fatty (change of) liver, not elsewhere classified: Secondary | ICD-10-CM

## 2021-06-27 DIAGNOSIS — Z1231 Encounter for screening mammogram for malignant neoplasm of breast: Secondary | ICD-10-CM

## 2021-06-27 DIAGNOSIS — F32A Depression, unspecified: Secondary | ICD-10-CM

## 2021-06-27 DIAGNOSIS — L989 Disorder of the skin and subcutaneous tissue, unspecified: Secondary | ICD-10-CM

## 2021-06-27 DIAGNOSIS — I25119 Atherosclerotic heart disease of native coronary artery with unspecified angina pectoris: Secondary | ICD-10-CM

## 2021-06-27 DIAGNOSIS — E1159 Type 2 diabetes mellitus with other circulatory complications: Secondary | ICD-10-CM

## 2021-06-27 DIAGNOSIS — E669 Obesity, unspecified: Secondary | ICD-10-CM

## 2021-06-27 DIAGNOSIS — J4520 Mild intermittent asthma, uncomplicated: Secondary | ICD-10-CM

## 2021-06-27 DIAGNOSIS — K219 Gastro-esophageal reflux disease without esophagitis: Secondary | ICD-10-CM

## 2021-06-27 DIAGNOSIS — K5090 Crohn's disease, unspecified, without complications: Secondary | ICD-10-CM

## 2021-06-27 DIAGNOSIS — D242 Benign neoplasm of left breast: Secondary | ICD-10-CM

## 2021-06-27 DIAGNOSIS — N281 Cyst of kidney, acquired: Secondary | ICD-10-CM

## 2021-06-27 DIAGNOSIS — I10 Essential (primary) hypertension: Secondary | ICD-10-CM

## 2021-06-27 DIAGNOSIS — D45 Polycythemia vera: Secondary | ICD-10-CM

## 2021-06-27 NOTE — Progress Notes (Signed)
[name], female   DOB: July 31, 1955, 66 y.o.   MRN: [id number] ? ? ?Subjective:  ? ? [name], female    DOB: Oct 03, 1955, 66 y.o.   MRN: [id number] ? ?This visit occurred during the SARS-CoV-2 public health emergency.  Safety protocols were in place, including screening questions prior to the visit, additional usage of staff PPE, and extensive cleaning of exam room while observing appropriate contact time as indicated for disinfecting solutions.  ? ?Patient here for a scheduled follow up.  ? ?Chief Complaint  ?Patient presents with  ? Hypertension  ? Hyperlipidemia  ? Diabetes  ? .  ? ?HPI ?Seeing Dr Gabriel Carina for her diabetes.  Recent a1c 7.6.  some nausea with trulicity.  Endocrinology recommended increasing dose to 4.57m.  reports her breathing is stable.  No increased cough or congestion.  No acid reflux reported.  No abdominal pain.  Bowels moving.  Handling stress.   ? ? ?Past Medical History:  ?Diagnosis Date  ? Arthritis   ? Asthma   ? Complication of anesthesia   ? HARD TO WAKE UP  ? COPD (chronic obstructive pulmonary disease) ([place]   ? [place]'s disease [place]   ? Depression   ? Diabetes [place]   ? Fracture, foot   ? post fall  ? Hypercholesterolemia   ? Hypertension   ? IBS (irritable bowel syndrome)   ? Nephrolithiasis   ? [place]  ? Vitamin D deficiency   ? ?Past Surgical History:  ?Procedure Laterality Date  ? ABDOMINAL HYSTERECTOMY  1985  ? APPENDECTOMY  1985  ? BREAST DUCTAL SYSTEM EXCISION Left 09/04/2019  ? Procedure: EXCISION DUCTAL SYSTEM BREAST;  Surgeon: BRobert Bellow MD;  Location: [facility] ORS;  Service: General;  Laterality: Left;  ? BREAST EXCISIONAL BIOPSY Right 2013  ? benign  ? COLONOSCOPY  2014  ? COLONOSCOPY WITH PROPOFOL N/A 02/10/2020  ? Procedure: COLONOSCOPY WITH PROPOFOL;  Surgeon: BRobert Bellow MD;  [facility];  Service: Endoscopy;  Laterality: N/A;  ? CORONARY STENT INTERVENTION N/A 03/07/2020   ? Procedure: CORONARY STENT INTERVENTION;  Surgeon: PIsaias Cowman MD;  [facility];  Service: Cardiovascular;  Laterality: N/A;  ? EXCISION / BIOPSY BREAST / NIPPLE / DUCT Left 2019  ? papilloma  ? EXCISION OF BREAST BIOPSY Left 2019  ? papilloma  ? LAPAROSCOPIC CHOLECYSTECTOMY  2005  ? Dr STamala Julian ? LAPAROSCOPIC SUPRACERVICAL HYSTERECTOMY  1985  ? left ovary not removed  ? LEFT HEART CATH AND CORONARY ANGIOGRAPHY N/A 03/07/2020  ? Procedure: LEFT HEART CATH AND CORONARY ANGIOGRAPHY;  Surgeon: PIsaias Cowman MD;  [facility];  Service: Cardiovascular;  Laterality: N/A;  ? LEFT HEART CATH AND CORONARY ANGIOGRAPHY N/A 03/10/2020  ? Procedure: LEFT HEART CATH AND CORONARY ANGIOGRAPHY and possible PCI and stent;  Surgeon: CYolonda Kida MD;  [facility];  Service: Cardiovascular;  Laterality: N/A;  ? LITHOTRIPSY    ? x7  ? OOPHORECTOMY    ? POLYPECTOMY    ? SHOULDER SURGERY    ? right (pinning)  ? SHOULDER SURGERY    ? left (pinning)  ? TONSILLECTOMY  1966  ? ?Family History  ?Problem Relation Age of Onset  ? Heart disease Father   ? Emphysema Maternal Grandmother   ? COPD Mother   ? Hypercholesterolemia Mother   ? Emphysema Mother   ? Hypertension Brother   ? Emphysema  Other   ?     grandmother  ? COPD Sister   ? Crohn's disease Sister   ? Hypertension Sister   ? Breast cancer Neg Hx   ? Colon cancer Neg Hx   ? ?Social History  ? ?Socioeconomic History  ? Marital status: Married  ?  Spouse name: Not on file  ? Number of children: 2  ? Years of education: Not on file  ? Highest education level: Not on file  ?Occupational History  ?  Employer: [employer]  ?Tobacco Use  ? Smoking status: Former  ?  Packs/day: 2.00  ?  Years: 15.00  ?  Pack years: 30.00  ?  Types: Cigarettes  ?  Quit date: 04/09/1997  ?  Years since quitting: 24.2  ? Smokeless tobacco: Never  ?Vaping Use  ? Vaping Use: Never used  ?Substance and Sexual Activity  ? Alcohol use:  Never  ?  Alcohol/week: 0.0 standard drinks  ? Drug use: No  ? Sexual activity: Not on file  ?Other Topics Concern  ? Not on file  ?Social History Narrative  ? Not on file  ? ?Social Determinants of Health  ? ?Financial Resource Strain: Not on file  ?Food Insecurity: Not on file  ?Transportation Needs: Not on file  ?Physical Activity: Not on file  ?Stress: Not on file  ?Social Connections: Not on file  ? ? ? ?Review of Systems  ?Constitutional:  Negative for appetite change and unexpected weight change.  ?HENT:  Negative for congestion and sinus pressure.   ?Respiratory:  Negative for cough, chest tightness and shortness of breath.   ?Cardiovascular:  Negative for chest pain, palpitations and leg swelling.  ?Gastrointestinal:  Negative for abdominal pain, diarrhea, nausea and vomiting.  ?Genitourinary:  Negative for difficulty urinating and dysuria.  ?Musculoskeletal:  Negative for joint swelling and myalgias.  ?Skin:  Negative for color change and rash.  ?Neurological:  Negative for dizziness, light-headedness and headaches.  ?Psychiatric/Behavioral:  Negative for agitation and dysphoric mood.   ? ?   ?Objective:  ?  ? ?BP 130/72   Pulse 83   Temp 97.9 ?F (36.6 ?C)   Resp 16   Ht 5' 5.5" (1.664 m)   Wt 223 lb (101.2 kg)   SpO2 98%   BMI 36.54 kg/m?  ?Wt Readings from Last 3 Encounters:  ?06/27/21 223 lb (101.2 kg)  ?03/13/21 208 lb (94.3 kg)  ?11/29/20 217 lb 3.2 oz (98.5 kg)  ? ? ?Physical Exam ?Vitals reviewed.  ?Constitutional:   ?   General: She is not in acute distress. ?   Appearance: Normal appearance.  ?HENT:  ?   Head: Normocephalic and atraumatic.  ?   Right Ear: External ear normal.  ?   Left Ear: External ear normal.  ?Eyes:  ?   General: No scleral icterus.    ?   Right eye: No discharge.     ?   Left eye: No discharge.  ?   Conjunctiva/sclera: Conjunctivae normal.  ?Neck:  ?   Thyroid: No thyromegaly.  ?Cardiovascular:  ?   Rate and Rhythm: Normal rate and regular rhythm.  ?Pulmonary:  ?    Effort: No respiratory distress.  ?   Breath sounds: Normal breath sounds. No wheezing.  ?Abdominal:  ?   General: Bowel sounds are normal.  ?   Palpations: Abdomen is soft.  ?   Tenderness: There is no abdominal tenderness.  ?Musculoskeletal:     ?  General: No swelling or tenderness.  ?   Cervical back: Neck supple. No tenderness.  ?Lymphadenopathy:  ?   Cervical: No cervical adenopathy.  ?Skin: ?   Findings: No erythema or rash.  ?Neurological:  ?   Mental Status: She is alert.  ?Psychiatric:     ?   Mood and Affect: Mood normal.     ?   Behavior: Behavior normal.  ? ? ? ?Outpatient Encounter Medications as of 06/27/2021  ?Medication Sig  ? acetaminophen (TYLENOL) 325 MG tablet Take 2 tablets (650 mg total) by mouth every 6 (six) hours as needed for mild pain (or Fever >/= 101).  ? albuterol (PROVENTIL) (2.5 MG/3ML) 0.083% nebulizer solution Take 2.5 mg by nebulization every 6 (six) hours as needed for wheezing.  ? amLODipine (NORVASC) 5 MG tablet Take 1 tablet by mouth daily.  ? aspirin 81 MG chewable tablet Chew 1 tablet by mouth daily.  ? buPROPion (WELLBUTRIN XL) 300 MG 24 hr tablet TAKE 1 TABLET DAILY  ? clotrimazole-betamethasone (LOTRISONE) cream Apply 1 application topically 2 (two) times daily.  ? fluticasone-salmeterol (ADVAIR HFA) 230-21 MCG/ACT inhaler Inhale 2 puffs into the lungs 2 (two) times daily.  ? FREESTYLE LITE test strip   ? hyoscyamine (LEVSIN SL) 0.125 MG SL tablet PLACE 1 TABLET (0.125 MG TOTAL) UNDER THE TONGUE DAILY AS NEEDED.  ? insulin glargine (LANTUS SOLOSTAR) 100 UNIT/ML Solostar Pen Inject 26 Units into the skin at bedtime.  ? ipratropium (ATROVENT) 0.06 % nasal spray Place 2 sprays into both nostrils 3 (three) times daily as needed for rhinitis.  ? Ipratropium-Albuterol (COMBIVENT) 20-100 MCG/ACT AERS respimat Inhale 2 puffs into the lungs every 4 (four) hours while awake. (Patient taking differently: Inhale 1 puff into the lungs 2 (two) times daily. And as needed as a rescue  inhaler.)  ? isosorbide mononitrate (IMDUR) 30 MG 24 hr tablet Take 1 tablet (30 mg total) by mouth daily.  ? JARDIANCE 25 MG TABS tablet Take 25 mg by mouth daily.  ? LORazepam (ATIVAN) 0.5 MG tablet   ? losartan (

## 2021-07-02 ENCOUNTER — Encounter: Payer: Self-pay | Admitting: Internal Medicine

## 2021-07-02 DIAGNOSIS — L989 Disorder of the skin and subcutaneous tissue, unspecified: Secondary | ICD-10-CM | POA: Insufficient documentation

## 2021-07-02 NOTE — Assessment & Plan Note (Signed)
Continue wellbutrin.  Does not feel needs any further intervention at this time.  Follow.   ?

## 2021-07-02 NOTE — Assessment & Plan Note (Signed)
Continue metoprolol, imdur and amlodipine.  Blood pressure doing well.  Follow pressures.  Follow metabolic panel.  ?

## 2021-07-02 NOTE — Assessment & Plan Note (Signed)
Continue advair and singulair.  Follow.   ?

## 2021-07-02 NOTE — Assessment & Plan Note (Signed)
1.8 cm simple cyst noted on abdominal ultrasound.  Follow.  ?

## 2021-07-02 NOTE — Assessment & Plan Note (Addendum)
Bowels stable. Colonoscopy 02/2020.  ?

## 2021-07-02 NOTE — Assessment & Plan Note (Signed)
Ras/lesion.  Requested referral to dermatology.  Request to see Dr Phillip Heal.  ?

## 2021-07-02 NOTE — Assessment & Plan Note (Signed)
S/p recent NSTEMI.  S/p PCI-LAD.  Continue effiant, aspirin, crestor and metoprolol.  Stable.   ?

## 2021-07-02 NOTE — Assessment & Plan Note (Signed)
Has seen hematology.  Follow cbc.  ?

## 2021-07-02 NOTE — Assessment & Plan Note (Signed)
Diet, exercise and weight loss.  ?

## 2021-07-02 NOTE — Assessment & Plan Note (Signed)
Continue crestor.  Low cholesterol diet and exercise.  Follow lipid panel and liver function tests.   ?

## 2021-07-02 NOTE — Assessment & Plan Note (Signed)
Changes c/w fatty liver found on recent ultrasound.  Diet, exercise and weight loss.  Follow liver function tests.  Recent liver panel wnl.  ?

## 2021-07-02 NOTE — Assessment & Plan Note (Signed)
Saw Dr Bary Castilla.  S/p resection of left retro-areolar ductal structures on 09/04/19.  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  Mammogram scheduled for 08/08/21.  ?

## 2021-07-02 NOTE — Assessment & Plan Note (Signed)
Continue aciphex. No upper symptoms reported.  ?

## 2021-07-09 ENCOUNTER — Other Ambulatory Visit: Payer: Self-pay | Admitting: Internal Medicine

## 2021-07-11 ENCOUNTER — Ambulatory Visit (INDEPENDENT_AMBULATORY_CARE_PROVIDER_SITE_OTHER): Payer: Medicare Other

## 2021-07-11 VITALS — Ht 65.5 in | Wt 223.0 lb

## 2021-07-11 DIAGNOSIS — Z0000 Encounter for general adult medical examination without abnormal findings: Secondary | ICD-10-CM | POA: Diagnosis not present

## 2021-07-11 NOTE — Patient Instructions (Addendum)
?  Ms. Erdman , ?Thank you for taking time to come for your Medicare Wellness Visit. I appreciate your ongoing commitment to your health goals. Please review the following plan we discussed and let me know if I can assist you in the future.  ? ?These are the goals we discussed: ? Goals   ? ?  A1C within range   ?  Portion control meals ?Reduce sugar intake ?  ? ?  ?  ?This is a list of the screening recommended for you and due dates:  ?Health Maintenance  ?Topic Date Due  ? COVID-19 Vaccine (4 - Booster for Pfizer series) 07/27/2021*  ? Hepatitis C Screening: USPSTF Recommendation to screen - Ages 18-79 yo.  10/07/2021*  ? Zoster (Shingles) Vaccine (1 of 2) 10/10/2021*  ? DEXA scan (bone density measurement)  07/12/2022*  ? Mammogram  08/05/2021  ? Flu Shot  11/07/2021  ? Hemoglobin A1C  12/23/2021  ? Eye exam for diabetics  02/20/2022  ? Complete foot exam   03/13/2022  ? Colon Cancer Screening  02/09/2030  ? Tetanus Vaccine  11/17/2030  ? Pneumonia Vaccine  Completed  ? HPV Vaccine  Aged Out  ?*Topic was postponed. The date shown is not the original due date.  ?  ?

## 2021-07-11 NOTE — Progress Notes (Signed)
Subjective:   Amanda Wiley is a 66 y.o. female who presents for an Initial Medicare Annual Wellness Visit.  Review of Systems    No ROS.  Medicare Wellness Virtual Visit.  Visual/audio telehealth visit, UTA vital signs.   See social history for additional risk factors.   Cardiac Risk Factors include: advanced age (>53men, >65 women)     Objective:    Today's Vitals   07/11/21 1407  Weight: 223 lb (101.2 kg)  Height: 5' 5.5" (1.664 m)   Body mass index is 36.54 kg/m.     07/11/2021    2:29 PM 11/22/2020    2:07 PM 11/16/2020    5:40 PM 03/21/2020    1:39 PM 03/10/2020    2:31 PM 03/09/2020    9:14 PM 03/06/2020    2:27 AM  Advanced Directives  Does Patient Have a Medical Advance Directive? No No No No  No No  Would patient like information on creating a medical advance directive? No - Patient declined   No - Patient declined No - Patient declined No - Patient declined No - Patient declined   Current Medications (verified) Outpatient Encounter Medications as of 07/11/2021  Medication Sig   acetaminophen (TYLENOL) 325 MG tablet Take 2 tablets (650 mg total) by mouth every 6 (six) hours as needed for mild pain (or Fever >/= 101).   albuterol (PROVENTIL) (2.5 MG/3ML) 0.083% nebulizer solution Take 2.5 mg by nebulization every 6 (six) hours as needed for wheezing.   amLODipine (NORVASC) 5 MG tablet Take 1 tablet by mouth daily.   aspirin 81 MG chewable tablet Chew 1 tablet by mouth daily.   buPROPion (WELLBUTRIN XL) 300 MG 24 hr tablet TAKE 1 TABLET DAILY   clotrimazole-betamethasone (LOTRISONE) cream Apply 1 application topically 2 (two) times daily.   fluticasone-salmeterol (ADVAIR HFA) 230-21 MCG/ACT inhaler Inhale 2 puffs into the lungs 2 (two) times daily.   FREESTYLE LITE test strip    hyoscyamine (LEVSIN SL) 0.125 MG SL tablet PLACE 1 TABLET (0.125 MG TOTAL) UNDER THE TONGUE DAILY AS NEEDED.   insulin glargine (LANTUS SOLOSTAR) 100 UNIT/ML Solostar Pen Inject 26 Units  into the skin at bedtime.   ipratropium (ATROVENT) 0.06 % nasal spray Place 2 sprays into both nostrils 3 (three) times daily as needed for rhinitis.   Ipratropium-Albuterol (COMBIVENT) 20-100 MCG/ACT AERS respimat Inhale 2 puffs into the lungs every 4 (four) hours while awake. (Patient taking differently: Inhale 1 puff into the lungs 2 (two) times daily. And as needed as a rescue inhaler.)   isosorbide mononitrate (IMDUR) 30 MG 24 hr tablet Take 1 tablet (30 mg total) by mouth daily.   JARDIANCE 25 MG TABS tablet Take 25 mg by mouth daily.   LORazepam (ATIVAN) 0.5 MG tablet    losartan (COZAAR) 25 MG tablet Take 1 tablet (25 mg total) by mouth daily.   metFORMIN (GLUCOPHAGE) 500 MG tablet TAKE 2 TABLETS TWICE A DAY   metoprolol tartrate (LOPRESSOR) 25 MG tablet TAKE 1 TABLET TWICE A DAY   nitroGLYCERIN (NITROSTAT) 0.4 MG SL tablet Place 1 tablet (0.4 mg total) under the tongue every 5 (five) minutes as needed for chest pain.   prasugrel (EFFIENT) 10 MG TABS tablet Take 1 tablet (10 mg total) by mouth daily.   RABEprazole (ACIPHEX) 20 MG tablet TAKE 1 TABLET IN THE MORNING AND AT BEDTIME   rosuvastatin (CRESTOR) 5 MG tablet Take 1 tablet (5 mg total) by mouth daily.   TRULICITY 3 MG/0.5ML  SOPN Inject 3 mg into the skin once a week.   ULTRACARE PEN NEEDLES 32G X 4 MM MISC    No facility-administered encounter medications on file as of 07/11/2021.    Allergies (verified) Avelox [moxifloxacin hcl in nacl], Moxifloxacin, 5-alpha reductase inhibitors, Allegra [fexofenadine hcl], Ibuprofen, Sulfa antibiotics, Tegretol [carbamazepine], Iodine, Ioxaglate, and Ivp dye [iodinated contrast media]   History: Past Medical History:  Diagnosis Date   Arthritis    Asthma    Complication of anesthesia    HARD TO WAKE UP   COPD (chronic obstructive pulmonary disease) (HCC)    Crohn's disease (HCC)    Depression    Diabetes mellitus (HCC)    Fracture, foot    post fall   Hypercholesterolemia     Hypertension    IBS (irritable bowel syndrome)    Nephrolithiasis    s/p lithotripsy [facility])   Vitamin D deficiency    Past Surgical History:  Procedure Laterality Date   ABDOMINAL HYSTERECTOMY  1985   APPENDECTOMY  1985   BREAST DUCTAL SYSTEM EXCISION Left 09/04/2019   Procedure: EXCISION DUCTAL SYSTEM BREAST;  Surgeon: Earline Mayotte, MD;  Location: [facility] ORS;  Service: General;  Laterality: Left;   BREAST EXCISIONAL BIOPSY Right 2013   benign   COLONOSCOPY  2014   COLONOSCOPY WITH PROPOFOL N/A 02/10/2020   Procedure: COLONOSCOPY WITH PROPOFOL;  Surgeon: Earline Mayotte, MD;  Location: [facility] ENDOSCOPY;  Service: Endoscopy;  Laterality: N/A;   CORONARY STENT INTERVENTION N/A 03/07/2020   Procedure: CORONARY STENT INTERVENTION;  Surgeon: Marcina Millard, MD;  Location: [facility] INV[facility]IVE CV [facility];  Service: Cardiovascular;  Laterality: N/A;   EXCISION / BIOPSY BREAST / NIPPLE / DUCT Left 2019   papilloma   EXCISION OF BREAST BIOPSY Left 2019   papilloma   LAPAROSCOPIC CHOLECYSTECTOMY  2005   Dr Katrinka Blazing   LAPAROSCOPIC SUPRACERVICAL HYSTERECTOMY  1985   left ovary not removed   LEFT HEART CATH AND CORONARY ANGIOGRAPHY N/A 03/07/2020   Procedure: LEFT HEART CATH AND CORONARY ANGIOGRAPHY;  Surgeon: Marcina Millard, MD;  Location: [facility];  Service: Cardiovascular;  Laterality: N/A;   LEFT HEART CATH AND CORONARY ANGIOGRAPHY N/A 03/10/2020   Procedure: LEFT HEART CATH AND CORONARY ANGIOGRAPHY and possible PCI and stent;  Surgeon: Alwyn Pea, MD;  Location: [facility];  Service: Cardiovascular;  Laterality: N/A;   LITHOTRIPSY     x7   OOPHORECTOMY     POLYPECTOMY     SHOULDER SURGERY     right (pinning)   SHOULDER SURGERY     left (pinning)   TONSILLECTOMY  1966   Family History  Problem Relation Age of Onset   Heart disease Father    Emphysema Maternal Grandmother    COPD Mother    Hypercholesterolemia Mother    Emphysema  Mother    Hypertension Brother    Emphysema Other        grandmother   COPD Sister    Crohn's disease Sister    Hypertension Sister    Breast cancer Neg Hx    Colon cancer Neg Hx    Social History   Socioeconomic History   Marital status: Married    Spouse name: Not on file   Number of children: 2   Years of education: Not on file   Highest education level: Not on file  Occupational History    Employer: [employer]  Tobacco Use   Smoking status: Former  Packs/day: 2.00    Years: 15.00    Pack years: 30.00    Types: Cigarettes    Quit date: 04/09/1997    Years since quitting: 24.2   Smokeless tobacco: Never  Vaping Use   Vaping Use: Never used  Substance and Sexual Activity   Alcohol use: Never    Alcohol/week: 0.0 standard drinks   Drug use: No   Sexual activity: Not on file  Other Topics Concern   Not on file  Social History Narrative   Not on file   Social Determinants of Health   Financial Resource Strain: Low Risk    Difficulty of Paying Living Expenses: Not hard at all  Food Insecurity: No Food Insecurity   Worried About Programme researcher, broadcasting/film/video in the Last Year: Never true   Ran Out of Food in the Last Year: Never true  Transportation Needs: No Transportation Needs   Lack of Transportation (Medical): No   Lack of Transportation (Non-Medical): No  Physical Activity: Not on file  Stress: No Stress Concern Present   Feeling of Stress : Not at all  Social Connections: Unknown   Frequency of Communication with Friends and Family: Not on file   Frequency of Social Gatherings with Friends and Family: Not on file   Attends Religious Services: Not on file   Active Member of Clubs or Organizations: Not on file   Attends Banker Meetings: Not on file   Marital Status: Married    Tobacco Counseling Counseling given: Not Answered   Clinical Intake:  Pre-visit preparation completed: Yes       Diabetes: Yes (Followed by Endocrinology, Dr.  Tedd Sias.)  How often do you need to have someone help you when you read instructions, pamphlets, or other written materials from your doctor or pharmacy?: 1 - Never Interpreter Needed?: No     Activities of Daily Living  Patient Care Team: Dale Shorewood, MD as PCP - General (Internal Medicine)  Indicate any recent Medical Services you may have received from other than [facility] providers in the past year (date may be approximate).     Assessment:   This is a routine wellness examination for Courtne.  Virtual Visit via Telephone Note  I connected with  Purvis Kilts on 07/11/21 at  2:00 PM EDT by telephone and verified that I am speaking with the correct person using two identifiers.  Persons participating in the virtual visit: patient/Nurse Health Advisor   I discussed the limitations of performing an evaluation and management service by telehealth. The patient expressed understanding and agreed to proceed. We continued and completed visit with audio only. Some vital signs may be absent or patient reported.   Hearing/Vision screen Hearing Screening - Comments:: Patient is able to hear conversational tones without difficulty.  No issues reported. Vision Screening - Comments:: Wears glasses  Dietary issues and exercise activities discussed: Current Exercise Habits: Home exercise routine, Intensity: Mild Low carb diet Good water intake    Goals Addressed             This Visit's Progress    A1C within range       Portion control meals Reduce sugar intake       Depression Screen    07/11/2021    2:27 PM 03/13/2021    2:07 PM 11/29/2020    9:13 AM 08/12/2020   11:44 AM 06/30/2020    1:33 PM 06/09/2020    1:34 PM 05/12/2020    1:41 PM  PHQ 2/9 Scores  PHQ - 2 Score 0 0 0 0 2 1 2   PHQ- 9 Score   0  6 7 12     Fall Risk    07/11/2021    2:31 PM 03/13/2021    2:07 PM 08/12/2020   11:44 AM 03/21/2020    1:39 PM 03/14/2020   12:14 PM  Fall Risk   Falls in the past year? 0 0 0 0 1   Number falls in past yr:  0 0 0 0  Injury with Fall?  0 0 0 1  Risk for fall due to :    No Fall Risks   Follow up Falls evaluation completed  Falls evaluation completed Falls evaluation completed;Education provided;Falls prevention discussed Falls evaluation completed    FALL RISK PREVENTION PERTAINING TO THE HOME:  Home free of loose throw rugs in walkways, pet beds, electrical cords, etc? Yes  Adequate lighting in your home to reduce risk of falls? Yes   ASSISTIVE DEVICES UTILIZED TO PREVENT FALLS: Use of a cane, walker or w/c? No   TIMED UP AND GO: Was the test performed? No .   Cognitive Function:  Patient is alert and oriented x3.  Manages her own finances and medications.     Immunizations Immunization History  Administered Date(s) Administered   Fluad Quad(high Dose 65+) 02/12/2020   Influenza Split 12/30/2013, 01/23/2016   Influenza,inj,Quad PF,6+ Mos 02/11/2018, 02/10/2019   Influenza-Unspecified 01/24/2015, 11/29/2020   PFIZER(Purple Top)SARS-COV-2 Vaccination 06/29/2019, 07/27/2019, 04/05/2020   PNEUMOCOCCAL CONJUGATE-20 08/12/2020   Tdap 11/16/2020   Shingrix Completed?: No.    Education has been provided regarding the importance of this vaccine. Patient has been advised to call insurance company to determine out of pocket expense if they have not yet received this vaccine. Advised may also receive vaccine at local pharmacy or Health Dept. Verbalized acceptance and understanding.  Screening Tests Health Maintenance  Topic Date Due   COVID-19 Vaccine (4 - Booster for Pfizer series) 07/27/2021 (Originally 05/31/2020)   Hepatitis C Screening  10/07/2021 (Originally 01/26/1974)   Zoster Vaccines- Shingrix (1 of 2) 10/10/2021 (Originally 01/26/1975)   DEXA SCAN  07/12/2022 (Originally 01/27/2020)   MAMMOGRAM  08/05/2021   INFLUENZA VACCINE  11/07/2021   HEMOGLOBIN A1C  12/23/2021   OPHTHALMOLOGY EXAM  02/20/2022   FOOT EXAM  03/13/2022   COLONOSCOPY (Pts  45-87yrs Insurance coverage will need to be confirmed)  02/09/2030   TETANUS/TDAP  11/17/2030   Pneumonia Vaccine 54+ Years old  Completed   HPV VACCINES  Aged Out   Health Maintenance There are no preventive care reminders to display for this patient.  Mammogram- scheduled.   Bone density- deferred per patient preference.   Lung Cancer Screening: (Low Dose CT Chest recommended if Age 72-80 years, 30 pack-year currently smoking OR have quit w/in 15years.) does not qualify.   Hepatitis C Screening: deferred per patient preference.   Vision Screening: Recommended annual ophthalmology exams for early detection of glaucoma and other disorders of the eye.  Dental Screening: Recommended annual dental exams for proper oral hygiene  Community Resource Referral / Chronic Care Management: CRR required this visit?  No   CCM required this visit?  No      Plan:     I have personally reviewed and noted the following in the patient's chart:   Medical and social history Use of alcohol, tobacco or illicit drugs  Current medications and supplements including opioid prescriptions. Patient is not currently taking opioid prescriptions.  Functional ability and status Nutritional status Physical activity Advanced directives List of other physicians Hospitalizations, surgeries, and ER visits in previous 12 months Vitals Screenings to include cognitive, depression, and falls Referrals and appointments  In addition, I have reviewed and discussed with patient certain preventive protocols, quality metrics, and best practice recommendations. A written personalized care plan for preventive services as well as general preventive health recommendations were provided to patient.     Ashok Pall, LPN   04/14/1094

## 2021-08-28 ENCOUNTER — Ambulatory Visit
Admission: RE | Admit: 2021-08-28 | Discharge: 2021-08-28 | Disposition: A | Discharge status: Home / Self Care | Payer: Medicare Other | Source: Ambulatory Visit | Attending: Internal Medicine | Admitting: Internal Medicine

## 2021-08-28 DIAGNOSIS — Z1231 Encounter for screening mammogram for malignant neoplasm of breast: Secondary | ICD-10-CM | POA: Insufficient documentation

## 2021-10-16 ENCOUNTER — Other Ambulatory Visit: Payer: Self-pay | Admitting: Internal Medicine

## 2021-10-19 ENCOUNTER — Encounter: Payer: Medicare Other | Admitting: Internal Medicine

## 2021-11-16 LAB — MICROALBUMIN / CREATININE URINE RATIO: Microalb Creat Ratio: 1.8

## 2021-11-16 LAB — HEMOGLOBIN A1C: Hemoglobin A1C: 7.5

## 2021-11-17 LAB — HM DIABETES EYE EXAM

## 2021-11-23 ENCOUNTER — Encounter: Payer: Medicare Other | Admitting: Internal Medicine

## 2021-12-08 ENCOUNTER — Encounter: Payer: Self-pay | Admitting: Internal Medicine

## 2021-12-08 ENCOUNTER — Ambulatory Visit (INDEPENDENT_AMBULATORY_CARE_PROVIDER_SITE_OTHER): Payer: Medicare Other | Admitting: Internal Medicine

## 2021-12-08 DIAGNOSIS — I10 Essential (primary) hypertension: Secondary | ICD-10-CM | POA: Diagnosis not present

## 2021-12-08 DIAGNOSIS — D242 Benign neoplasm of left breast: Secondary | ICD-10-CM

## 2021-12-08 DIAGNOSIS — E1159 Type 2 diabetes mellitus with other circulatory complications: Secondary | ICD-10-CM | POA: Diagnosis not present

## 2021-12-08 DIAGNOSIS — K5090 Crohn's disease, unspecified, without complications: Secondary | ICD-10-CM

## 2021-12-08 DIAGNOSIS — Z794 Long term (current) use of insulin: Secondary | ICD-10-CM

## 2021-12-08 DIAGNOSIS — I25119 Atherosclerotic heart disease of native coronary artery with unspecified angina pectoris: Secondary | ICD-10-CM

## 2021-12-08 DIAGNOSIS — E7800 Pure hypercholesterolemia, unspecified: Secondary | ICD-10-CM

## 2021-12-08 DIAGNOSIS — K219 Gastro-esophageal reflux disease without esophagitis: Secondary | ICD-10-CM

## 2021-12-08 DIAGNOSIS — D45 Polycythemia vera: Secondary | ICD-10-CM | POA: Diagnosis not present

## 2021-12-08 DIAGNOSIS — K760 Fatty (change of) liver, not elsewhere classified: Secondary | ICD-10-CM

## 2021-12-08 DIAGNOSIS — F32A Depression, unspecified: Secondary | ICD-10-CM

## 2021-12-08 DIAGNOSIS — J4520 Mild intermittent asthma, uncomplicated: Secondary | ICD-10-CM

## 2021-12-08 LAB — CBC WITH DIFFERENTIAL/PLATELET
Basophils Absolute: 0.1 10*3/uL (ref 0.0–0.1)
Basophils Relative: 1.2 % (ref 0.0–3.0)
Eosinophils Absolute: 0.3 10*3/uL (ref 0.0–0.7)
Eosinophils Relative: 4 % (ref 0.0–5.0)
HCT: 47.7 % — ABNORMAL HIGH (ref 36.0–46.0)
Hemoglobin: 15.5 g/dL — ABNORMAL HIGH (ref 12.0–15.0)
Lymphocytes Relative: 26.8 % (ref 12.0–46.0)
Lymphs Abs: 1.9 10*3/uL (ref 0.7–4.0)
MCHC: 32.5 g/dL (ref 30.0–36.0)
MCV: 84.5 fl (ref 78.0–100.0)
Monocytes Absolute: 0.8 10*3/uL (ref 0.1–1.0)
Monocytes Relative: 11.2 % (ref 3.0–12.0)
Neutro Abs: 4 10*3/uL (ref 1.4–7.7)
Neutrophils Relative %: 56.8 % (ref 43.0–77.0)
Platelets: 161 10*3/uL (ref 150.0–400.0)
RBC: 5.64 Mil/uL — ABNORMAL HIGH (ref 3.87–5.11)
RDW: 17.5 % — ABNORMAL HIGH (ref 11.5–15.5)
WBC: 7 10*3/uL (ref 4.0–10.5)

## 2021-12-08 LAB — LIPID PANEL
Cholesterol: 135 mg/dL (ref 0–200)
HDL: 52 mg/dL (ref >39.00)
LDL Cholesterol: 55 mg/dL (ref 0–99)
NonHDL: 83.14
Total CHOL/HDL Ratio: 3
Triglycerides: 140 mg/dL (ref 0.0–149.0)
VLDL: 28 mg/dL (ref 0.0–40.0)

## 2021-12-08 LAB — HEPATIC FUNCTION PANEL
ALT: 31 U/L (ref 0–35)
AST: 22 U/L (ref 0–37)
Albumin: 4.1 g/dL (ref 3.5–5.2)
Alkaline Phosphatase: 55 U/L (ref 39–117)
Bilirubin, Direct: 0.1 mg/dL (ref 0.0–0.3)
Total Bilirubin: 0.5 mg/dL (ref 0.2–1.2)
Total Protein: 7.3 g/dL (ref 6.0–8.3)

## 2021-12-08 LAB — TSH: TSH: 1.42 u[IU]/mL (ref 0.35–5.50)

## 2021-12-08 NOTE — Progress Notes (Unsigned)
[name], female   DOB: May 24, 1955, 66 y.o.   MRN: [id number]   Subjective:    [name], female    DOB: Dec 22, 1955, 66 y.o.   MRN: [id number]   Patient here for a scheduled follow up.   HPI Here to follow up regarding her diabetes, blood pressure and cholesterol.  She reports she is doing relatively well.  Feels better.  Handling stress.  Sees Dr Gabriel Carina for her diabetes.  Just recently increased lantus - now taking 28 units.  Sugars are better.  No chest pain.  Breathing stable on current regimen.  Just evaluated 10/25/21 - stable.  Reports some occasional nausea.  Associates with trulicity - occurs for a couple of days after injection.  Eating.  No vomiting.  Does not want to change.  Right ankle - bruising - no pain.  Resolving.  No abdominal pain.  Bowels stable.     Past Medical History:  Diagnosis Date   Arthritis    Asthma    Complication of anesthesia    HARD TO WAKE UP   COPD (chronic obstructive pulmonary disease) ([place])    Crohn's disease ([place])    Depression    Diabetes mellitus ([place])    Fracture, foot    post fall   Hypercholesterolemia    Hypertension    IBS (irritable bowel syndrome)    Nephrolithiasis    s/p lithotripsy [facility])   Vitamin D deficiency    Past Surgical History:  Procedure Laterality Date   [place] EXCISION Left 09/04/2019   Procedure: EXCISION DUCTAL SYSTEM BREAST;  Surgeon: Robert Bellow, MD;  Location: [facility] ORS;  Service: General;  Laterality: Left;   BREAST EXCISIONAL BIOPSY Right 2013   benign   COLONOSCOPY  2014   COLONOSCOPY WITH PROPOFOL N/A 02/10/2020   Procedure: COLONOSCOPY WITH PROPOFOL;  Surgeon: Robert Bellow, MD;  Location: [facility] ENDOSCOPY;  Service: Endoscopy;  Laterality: N/A;   CORONARY STENT INTERVENTION N/A 03/07/2020   Procedure: CORONARY STENT INTERVENTION;  Surgeon: Isaias Cowman, MD;  Location:  [place] CV LAB;  Service: Cardiovascular;  Laterality: N/A;   EXCISION / BIOPSY BREAST / NIPPLE / DUCT Left 2019   papilloma   EXCISION OF BREAST BIOPSY Left 2019   papilloma   LAPAROSCOPIC CHOLECYSTECTOMY  2005   Dr Tamala Julian   LAPAROSCOPIC SUPRACERVICAL HYSTERECTOMY  1985   left ovary not removed   LEFT HEART CATH AND CORONARY ANGIOGRAPHY N/A 03/07/2020   Procedure: LEFT HEART CATH AND CORONARY ANGIOGRAPHY;  Surgeon: Isaias Cowman, MD;  Location: [place] [facility] LAB;  Service: Cardiovascular;  Laterality: N/A;   LEFT HEART CATH AND CORONARY ANGIOGRAPHY N/A 03/10/2020   Procedure: LEFT HEART CATH AND CORONARY ANGIOGRAPHY and possible PCI and stent;  Surgeon: Yolonda Kida, MD;  Location: [place] CV LAB;  Service: Cardiovascular;  Laterality: N/A;   LITHOTRIPSY     x7   OOPHORECTOMY     POLYPECTOMY     SHOULDER SURGERY     right (pinning)   SHOULDER SURGERY     left (pinning)   TONSILLECTOMY  1966   Family History  Problem Relation Age of Onset   Heart disease Father    Emphysema Maternal Grandmother    COPD Mother    Hypercholesterolemia Mother    Emphysema Mother    Hypertension Brother    Emphysema Other  grandmother   COPD Sister    Crohn's disease Sister    Hypertension Sister    Breast cancer Neg Hx    Colon cancer Neg Hx    Social History   Socioeconomic History   Marital status: Married    Spouse name: Not on file   Number of children: 2   Years of education: Not on file   Highest education level: Not on file  Occupational History    Employer: [employer]  Tobacco Use   Smoking status: Former    Packs/day: 2.00    Years: 15.00    Total pack years: 30.00    Types: Cigarettes    Quit date: 04/09/1997    Years since quitting: 24.6   Smokeless tobacco: Never  Vaping Use   Vaping Use: Never used  Substance and Sexual Activity   Alcohol use: Never    Alcohol/week: 0.0 standard drinks of alcohol   Drug use: No   Sexual  activity: Not on file  Other Topics Concern   Not on file  Social History Narrative   Not on file   Social Determinants of Health   Financial Resource Strain: Low Risk  (07/11/2021)   Overall Financial Resource Strain (CARDIA)    Difficulty of Paying Living Expenses: Not hard at all  Food Insecurity: No Food Insecurity (07/11/2021)   Hunger Vital Sign    Worried About Running Out of Food in the Last Year: Never true    [place] in the Last Year: Never true  Transportation Needs: No Transportation Needs (07/11/2021)   PRAPARE - Hydrologist (Medical): No    Lack of Transportation (Non-Medical): No  Physical Activity: Not on file  Stress: No Stress Concern Present (07/11/2021)   [place]    Feeling of Stress : Not at all  Social Connections: Unknown (07/11/2021)   Social Connection and Isolation Panel [NHANES]    Frequency of Communication with Friends and Family: Not on file    Frequency of Social Gatherings with Friends and Family: Not on file    Attends Religious Services: Not on file    Active Member of Clubs or Organizations: Not on file    Attends Archivist Meetings: Not on file    Marital Status: Married     Review of Systems  Constitutional:  Negative for appetite change and unexpected weight change.  HENT:  Negative for congestion and sinus pressure.   Respiratory:  Negative for cough, chest tightness and shortness of breath.   Cardiovascular:  Negative for chest pain, palpitations and leg swelling.  Gastrointestinal:  Positive for nausea. Negative for abdominal pain, diarrhea and vomiting.  Genitourinary:  Negative for difficulty urinating and dysuria.  Musculoskeletal:  Negative for joint swelling and myalgias.  Skin:  Negative for color change and rash.  Neurological:  Negative for dizziness, light-headedness and headaches.  Psychiatric/Behavioral:  Negative  for agitation and dysphoric mood.        Objective:     BP 122/68   Pulse 71   Temp (!) 97.5 F (36.4 C) (Oral)   Ht 5' 5.5" (1.664 m)   Wt 218 lb 12.8 oz (99.2 kg)   SpO2 98%   BMI 35.86 kg/m  Wt Readings from Last 3 Encounters:  12/08/21 218 lb 12.8 oz (99.2 kg)  07/11/21 223 lb (101.2 kg)  06/27/21 223 lb (101.2 kg)    Physical Exam Vitals  reviewed.  Constitutional:      General: She is not in acute distress.    Appearance: Normal appearance. She is well-developed.  HENT:     Head: Normocephalic and atraumatic.     Right Ear: External ear normal.     Left Ear: External ear normal.  Eyes:     General: No scleral icterus.       Right eye: No discharge.        Left eye: No discharge.     Conjunctiva/sclera: Conjunctivae normal.  Neck:     Thyroid: No thyromegaly.  Cardiovascular:     Rate and Rhythm: Normal rate and regular rhythm.  Pulmonary:     Effort: No tachypnea, accessory muscle usage or respiratory distress.     Breath sounds: Normal breath sounds. No decreased breath sounds or wheezing.  Chest:  Breasts:    Right: No inverted nipple, mass, nipple discharge or tenderness (no axillary adenopathy).     Left: No inverted nipple, mass, nipple discharge or tenderness (no axilarry adenopathy).  Abdominal:     General: Bowel sounds are normal.     Palpations: Abdomen is soft.     Tenderness: There is no abdominal tenderness.  Musculoskeletal:        General: No swelling or tenderness.     Cervical back: Neck supple. No tenderness.  Lymphadenopathy:     Cervical: No cervical adenopathy.  Skin:    Findings: No erythema or rash.  Neurological:     Mental Status: She is alert and oriented to person, place, and time.  Psychiatric:        Mood and Affect: Mood normal.        Behavior: Behavior normal.      Outpatient Encounter Medications as of 12/08/2021  Medication Sig   acetaminophen (TYLENOL) 325 MG tablet Take 2 tablets (650 mg total) by mouth every  6 (six) hours as needed for mild pain (or Fever >/= 101).   amLODipine (NORVASC) 5 MG tablet Take 1 tablet by mouth daily.   buPROPion (WELLBUTRIN XL) 300 MG 24 hr tablet TAKE 1 TABLET DAILY   clotrimazole-betamethasone (LOTRISONE) cream Apply 1 application topically 2 (two) times daily.   fluticasone-salmeterol (ADVAIR HFA) 230-21 MCG/ACT inhaler Inhale 2 puffs into the lungs 2 (two) times daily.   FREESTYLE LITE test strip    hyoscyamine (LEVSIN SL) 0.125 MG SL tablet PLACE 1 TABLET (0.125 MG TOTAL) UNDER THE TONGUE DAILY AS NEEDED.   insulin glargine (LANTUS SOLOSTAR) 100 UNIT/ML Solostar Pen Inject 26 Units into the skin at bedtime.   ipratropium (ATROVENT) 0.06 % nasal spray Place 2 sprays into both nostrils 3 (three) times daily as needed for rhinitis.   Ipratropium-Albuterol (COMBIVENT) 20-100 MCG/ACT AERS respimat Inhale 2 puffs into the lungs every 4 (four) hours while awake. (Patient taking differently: Inhale 1 puff into the lungs 2 (two) times daily. And as needed as a rescue inhaler.)   isosorbide mononitrate (IMDUR) 30 MG 24 hr tablet Take 1 tablet (30 mg total) by mouth daily.   JARDIANCE 25 MG TABS tablet Take 25 mg by mouth daily.   LORazepam (ATIVAN) 0.5 MG tablet    losartan (COZAAR) 25 MG tablet Take 1 tablet (25 mg total) by mouth daily.   metFORMIN (GLUCOPHAGE) 500 MG tablet TAKE 2 TABLETS TWICE A DAY   metoprolol tartrate (LOPRESSOR) 25 MG tablet TAKE 1 TABLET TWICE A DAY   nitroGLYCERIN (NITROSTAT) 0.4 MG SL tablet Place 1 tablet (0.4 mg total) under  the tongue every 5 (five) minutes as needed for chest pain.   prasugrel (EFFIENT) 10 MG TABS tablet Take 1 tablet (10 mg total) by mouth daily.   RABEprazole (ACIPHEX) 20 MG tablet TAKE 1 TABLET IN THE MORNING AND AT BEDTIME   rosuvastatin (CRESTOR) 5 MG tablet TAKE 1 TABLET DAILY   TRULICITY 3 SN/0.5LZ SOPN Inject 3 mg into the skin once a week.   ULTRACARE PEN NEEDLES 32G X 4 MM MISC    [DISCONTINUED] albuterol (PROVENTIL)  (2.5 MG/3ML) 0.083% nebulizer solution Take 2.5 mg by nebulization every 6 (six) hours as needed for wheezing. (Patient not taking: Reported on 12/08/2021)   [DISCONTINUED] aspirin 81 MG chewable tablet Chew 1 tablet by mouth daily. (Patient not taking: Reported on 12/08/2021)   No facility-administered encounter medications on file as of 12/08/2021.     Lab Results  Component Value Date   WBC 7.0 12/08/2021   HGB 15.5 (H) 12/08/2021   HCT 47.7 (H) 12/08/2021   PLT 161.0 12/08/2021   GLUCOSE 111 (H) 06/22/2021   CHOL 135 12/08/2021   TRIG 140.0 12/08/2021   HDL 52.00 12/08/2021   LDLDIRECT 78.0 01/23/2021   LDLCALC 55 12/08/2021   ALT 31 12/08/2021   AST 22 12/08/2021   NA 141 06/22/2021   K 3.9 06/22/2021   CL 104 06/22/2021   CREATININE 0.71 06/22/2021   BUN 11 06/22/2021   CO2 29 06/22/2021   TSH 1.42 12/08/2021   INR 0.9 03/06/2020   HGBA1C 7.6 (H) 06/22/2021   MICROALBUR 0.7 06/02/2019    MM 3D SCREEN BREAST BILATERAL  Result Date: 08/29/2021 CLINICAL DATA:  Screening. EXAM: DIGITAL SCREENING BILATERAL MAMMOGRAM WITH TOMOSYNTHESIS AND CAD TECHNIQUE: Bilateral screening digital craniocaudal and mediolateral oblique mammograms were obtained. Bilateral screening digital breast tomosynthesis was performed. The images were evaluated with computer-aided detection. COMPARISON:  Previous exam(s). ACR Breast Density Category b: There are scattered areas of fibroglandular density. FINDINGS: There are no findings suspicious for malignancy. IMPRESSION: No mammographic evidence of malignancy. A result letter of this screening mammogram will be mailed directly to the patient. RECOMMENDATION: Screening mammogram in one year. (Code:SM-B-01Y) BI-RADS CATEGORY  1: Negative. Electronically Signed   By: Everlean Alstrom M.D.   On: 08/29/2021 16:30       Assessment & Plan:   Problem List Items Addressed This Visit     Asthma    Continue advair and singulair.  Follow.        Coronary artery  disease involving native heart with angina pectoris ([place])    S/p NSTEMI.  S/p PCI-LAD.  Continue effiant, imdur, aspirin, crestor and metoprolol.  Stable.        Crohn's disease ([place])    Bowels stable. Colonoscopy 02/2020.       Diabetes mellitus (HCC)    Seeing Dr Gabriel Carina.  Discussed trulicity and nausea.  Wants to continue current medication regimen.  Low carb diet and exercise.  Follow met b and a1c. Needs urine microalbumin/cr ratio checked.        GERD (gastroesophageal reflux disease)    Continue aciphex. No upper symptoms reported.       Hepatic steatosis    Changes c/w fatty liver on ultrasound.  Diet, exercise and weight loss.  Follow liver function tests.        Hypercholesterolemia without hypertriglyceridemia    Continue crestor.  Low cholesterol diet and exercise.  Follow lipid panel and liver function tests.        Relevant Orders  Hepatic function panel (Completed)   Lipid panel (Completed)   CBC with Differential/Platelet (Completed)   TSH (Completed)   Hypertension    Continue losartan, metoprolol, imdur and amlodipine.  Blood pressure doing well.  Follow pressures.  Follow metabolic panel.       Mild depression (HCC)    Continue wellbutrin.  Doing better. Does not feel needs any further intervention at this time.  Follow.        Papilloma of left breast    Saw Dr Bary Castilla.  S/p resection of left retro-areolar ductal structures on 09/04/19.  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  Mammogram 08/29/21 - Birads I.        Polycythemia vera ([place])    Has seen hematology.  Follow cbc.         Einar Pheasant, MD

## 2021-12-09 ENCOUNTER — Encounter: Payer: Self-pay | Admitting: Internal Medicine

## 2021-12-09 ENCOUNTER — Other Ambulatory Visit: Payer: Self-pay | Admitting: Internal Medicine

## 2021-12-09 DIAGNOSIS — R7309 Other abnormal glucose: Secondary | ICD-10-CM

## 2021-12-09 NOTE — Assessment & Plan Note (Signed)
Saw Dr Bary Castilla.  S/p resection of left retro-areolar ductal structures on 09/04/19.  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  Mammogram 08/29/21 - Birads I.

## 2021-12-09 NOTE — Assessment & Plan Note (Addendum)
Continue wellbutrin.  Doing better. Does not feel needs any further intervention at this time.  Follow.

## 2021-12-09 NOTE — Assessment & Plan Note (Signed)
Continue aciphex. No upper symptoms reported.

## 2021-12-09 NOTE — Assessment & Plan Note (Signed)
Has seen hematology.  Follow cbc.

## 2021-12-09 NOTE — Assessment & Plan Note (Signed)
S/p NSTEMI.  S/p PCI-LAD.  Continue effiant, imdur, aspirin, crestor and metoprolol.  Stable.

## 2021-12-09 NOTE — Progress Notes (Signed)
Order placed for f/u cbc.

## 2021-12-09 NOTE — Assessment & Plan Note (Addendum)
Continue losartan, metoprolol, imdur and amlodipine.  Blood pressure doing well.  Follow pressures.  Follow metabolic panel.

## 2021-12-09 NOTE — Assessment & Plan Note (Signed)
Continue crestor.  Low cholesterol diet and exercise.  Follow lipid panel and liver function tests.

## 2021-12-09 NOTE — Assessment & Plan Note (Signed)
Seeing Dr Gabriel Carina.  Discussed trulicity and nausea.  Wants to continue current medication regimen.  Low carb diet and exercise.  Follow met b and a1c. Needs urine microalbumin/cr ratio checked.

## 2021-12-09 NOTE — Assessment & Plan Note (Signed)
Changes c/w fatty liver on ultrasound.  Diet, exercise and weight loss.  Follow liver function tests.

## 2021-12-09 NOTE — Assessment & Plan Note (Signed)
Bowels stable. Colonoscopy 02/2020.

## 2021-12-09 NOTE — Assessment & Plan Note (Signed)
Continue advair and singulair.  Follow.

## 2021-12-10 ENCOUNTER — Encounter: Payer: Self-pay | Admitting: *Deleted

## 2021-12-12 ENCOUNTER — Other Ambulatory Visit: Payer: Self-pay

## 2021-12-28 ENCOUNTER — Telehealth: Payer: Self-pay | Admitting: *Deleted

## 2021-12-28 NOTE — Patient Outreach (Signed)
  Care Coordination   Initial Visit Note   12/28/2021 Name: Amanda Wiley MRN: [id number] DOB: [birth year]  Amanda Wiley is a 66 y.o. year old female who sees Einar Pheasant, MD for primary care. I spoke with  Burt Knack by phone today.  What matters to the patients health and wellness today?  No concerns expressed.  RN discussed services [facility] services, RN, SW, and Pharmacist. Patient declined services.     Goals Addressed             This Visit's Progress    Patient advised to follow up with PCP for AWV and vaccines          SDOH assessments and interventions completed:  Yes     Care Coordination Interventions Activated:  Yes  Care Coordination Interventions:  Yes, provided   Follow up plan: No further intervention required.   Encounter Outcome:  Pt. [place] Care Management [phone]

## 2022-01-11 ENCOUNTER — Other Ambulatory Visit: Payer: Medicare Other

## 2022-01-12 ENCOUNTER — Other Ambulatory Visit (INDEPENDENT_AMBULATORY_CARE_PROVIDER_SITE_OTHER): Payer: Medicare Other

## 2022-01-12 DIAGNOSIS — R7309 Other abnormal glucose: Secondary | ICD-10-CM

## 2022-01-12 LAB — CBC WITH DIFFERENTIAL/PLATELET
Basophils Absolute: 0.1 10*3/uL (ref 0.0–0.1)
Basophils Relative: 0.8 % (ref 0.0–3.0)
Eosinophils Absolute: 0.2 10*3/uL (ref 0.0–0.7)
Eosinophils Relative: 3 % (ref 0.0–5.0)
HCT: 45.8 % (ref 36.0–46.0)
Hemoglobin: 14.8 g/dL (ref 12.0–15.0)
Lymphocytes Relative: 27.1 % (ref 12.0–46.0)
Lymphs Abs: 2.2 10*3/uL (ref 0.7–4.0)
MCHC: 32.3 g/dL (ref 30.0–36.0)
MCV: 85.4 fl (ref 78.0–100.0)
Monocytes Absolute: 1 10*3/uL (ref 0.1–1.0)
Monocytes Relative: 12.2 % — ABNORMAL HIGH (ref 3.0–12.0)
Neutro Abs: 4.5 10*3/uL (ref 1.4–7.7)
Neutrophils Relative %: 56.9 % (ref 43.0–77.0)
Platelets: 161 10*3/uL (ref 150.0–400.0)
RBC: 5.37 Mil/uL — ABNORMAL HIGH (ref 3.87–5.11)
RDW: 17.6 % — ABNORMAL HIGH (ref 11.5–15.5)
WBC: 8 10*3/uL (ref 4.0–10.5)

## 2022-02-13 ENCOUNTER — Encounter: Payer: Self-pay | Admitting: Internal Medicine

## 2022-02-13 ENCOUNTER — Telehealth: Payer: Self-pay

## 2022-02-13 ENCOUNTER — Other Ambulatory Visit: Payer: Self-pay

## 2022-02-13 ENCOUNTER — Other Ambulatory Visit (INDEPENDENT_AMBULATORY_CARE_PROVIDER_SITE_OTHER): Payer: Medicare Other

## 2022-02-13 ENCOUNTER — Other Ambulatory Visit: Payer: Self-pay | Admitting: *Deleted

## 2022-02-13 ENCOUNTER — Telehealth (INDEPENDENT_AMBULATORY_CARE_PROVIDER_SITE_OTHER): Payer: Medicare Other | Admitting: Internal Medicine

## 2022-02-13 VITALS — Ht 65.5 in | Wt 216.0 lb

## 2022-02-13 DIAGNOSIS — R300 Dysuria: Secondary | ICD-10-CM | POA: Diagnosis not present

## 2022-02-13 DIAGNOSIS — I25119 Atherosclerotic heart disease of native coronary artery with unspecified angina pectoris: Secondary | ICD-10-CM | POA: Diagnosis not present

## 2022-02-13 DIAGNOSIS — E1159 Type 2 diabetes mellitus with other circulatory complications: Secondary | ICD-10-CM

## 2022-02-13 DIAGNOSIS — Z794 Long term (current) use of insulin: Secondary | ICD-10-CM | POA: Diagnosis not present

## 2022-02-13 LAB — POCT URINALYSIS DIP (MANUAL ENTRY)
Blood, UA: NEGATIVE
Glucose, UA: >=1000 mg/dL — AB
Leukocytes, UA: NEGATIVE
Nitrite, UA: POSITIVE — AB
Protein Ur, POC: NEGATIVE mg/dL
Spec Grav, UA: 1.015 (ref 1.010–1.025)
Urobilinogen, UA: 1 E.U./dL
pH, UA: 5 (ref 5.0–8.0)

## 2022-02-13 MED ORDER — PHENAZOPYRIDINE HCL 200 MG PO TABS: 200.0000 mg | ORAL_TABLET | Freq: Three times a day (TID) | ORAL | 0 refills | Status: DC | PRN

## 2022-02-13 MED ORDER — CEFDINIR 300 MG PO CAPS: 300.0000 mg | ORAL_CAPSULE | Freq: Two times a day (BID) | ORAL | 0 refills | Status: DC

## 2022-02-13 MED ORDER — FLUCONAZOLE 150 MG PO TABS: ORAL_TABLET | ORAL | 0 refills | Status: DC

## 2022-02-13 NOTE — Addendum Note (Signed)
Addended by: Leeanne Rio on: 02/13/2022 04:10 PM   Modules accepted: Orders

## 2022-02-13 NOTE — Telephone Encounter (Signed)
Ok.  Have her come and leave sample and add on to schedule today.

## 2022-02-13 NOTE — Telephone Encounter (Signed)
Pt called c/o urinary frequency & burning ongoing since yesterday. Was seen for the same issue 3 wks ago @[facility]  walk-in. Cx came back + & was given Augmentin to tx infection, pt concerned of sx's not being treated completely & is requesting UA labs to be placed for her to come in & have done. No available appts in ofc & pt denied appt @[facility]  due to insurance not covering & does not want to go to UC again.

## 2022-02-13 NOTE — Progress Notes (Signed)
[name], female   DOB: 1956-03-05, 66 y.o.   MRN: [id number]   Virtual Visit via video Note   All issues noted in this document were discussed and addressed.  No physical exam was performed (except for noted visual exam findings with Video Visits).   I connected with Amanda Wiley by a video enabled telemedicine application and verified that I am speaking with the correct person using two identifiers. Location patient: home Location provider: work  Persons participating in the virtual visit: patient, provider  The limitations, risks, security and privacy concerns of performing an evaluation and management service by video and the availability of in person appointments have been discussed.  It has also been discussed with the patient that there may be a patient responsible charge related to this service. The patient expressed understanding and agreed to proceed.   Reason for visit: work in appt  HPI: Work in with concerns regarding possible UTI.  Was seen acute care 01/08/22 - diagnosed with UTI.  Treated with pyridium and augmentin.  Symptoms improved.  Over the past few days - symptoms worsened.  Reports increased urinary frequency and dysuria.  No vomiting.  Blood sugar ok 119, 130 (am).  Does report some concern regarding yeast.     ROS: See pertinent positives and negatives per HPI.  Past Medical History:  Diagnosis Date   Arthritis    Asthma    Complication of anesthesia    HARD TO WAKE UP   COPD (chronic obstructive pulmonary disease) ([place])    Crohn's disease ([place])    Depression    Diabetes mellitus ([place])    Fracture, foot    post fall   Hypercholesterolemia    Hypertension    IBS (irritable bowel syndrome)    Nephrolithiasis    s/p lithotripsy [facility])   Vitamin D deficiency     Past Surgical History:  Procedure Laterality Date   [place] EXCISION Left 09/04/2019    Procedure: EXCISION DUCTAL SYSTEM BREAST;  Surgeon: Robert Bellow, MD;  Location: [facility] ORS;  Service: General;  Laterality: Left;   BREAST EXCISIONAL BIOPSY Right 2013   benign   COLONOSCOPY  2014   COLONOSCOPY WITH PROPOFOL N/A 02/10/2020   Procedure: COLONOSCOPY WITH PROPOFOL;  Surgeon: Robert Bellow, MD;  Location: [facility] ENDOSCOPY;  Service: Endoscopy;  Laterality: N/A;   CORONARY STENT INTERVENTION N/A 03/07/2020   Procedure: CORONARY STENT INTERVENTION;  Surgeon: Isaias Cowman, MD;  Location: [place] [facility] LAB;  Service: Cardiovascular;  Laterality: N/A;   EXCISION / BIOPSY BREAST / NIPPLE / DUCT Left 2019   papilloma   EXCISION OF BREAST BIOPSY Left 2019   papilloma   LAPAROSCOPIC CHOLECYSTECTOMY  2005   Dr Tamala Julian   LAPAROSCOPIC SUPRACERVICAL HYSTERECTOMY  1985   left ovary not removed   LEFT HEART CATH AND CORONARY ANGIOGRAPHY N/A 03/07/2020   Procedure: LEFT HEART CATH AND CORONARY ANGIOGRAPHY;  Surgeon: Isaias Cowman, MD;  Location: [place] [facility] LAB;  Service: Cardiovascular;  Laterality: N/A;   LEFT HEART CATH AND CORONARY ANGIOGRAPHY N/A 03/10/2020   Procedure: LEFT HEART CATH AND CORONARY ANGIOGRAPHY and possible PCI and stent;  Surgeon: Yolonda Kida, MD;  Location: [place] CV LAB;  Service: Cardiovascular;  Laterality: N/A;   LITHOTRIPSY     x7   OOPHORECTOMY     POLYPECTOMY     SHOULDER SURGERY  right (pinning)   SHOULDER SURGERY     left (pinning)   TONSILLECTOMY  1966    Family History  Problem Relation Age of Onset   Heart disease Father    Emphysema Maternal Grandmother    COPD Mother    Hypercholesterolemia Mother    Emphysema Mother    Hypertension Brother    Emphysema Other        grandmother   COPD Sister    Crohn's disease Sister    Hypertension Sister    Breast cancer Neg Hx    Colon cancer Neg Hx     SOCIAL HX: reviewed.    Current Outpatient Medications:    acetaminophen (TYLENOL) 325 MG tablet,  Take 2 tablets (650 mg total) by mouth every 6 (six) hours as needed for mild pain (or Fever >/= 101)., Disp:  , Rfl:    amLODipine (NORVASC) 5 MG tablet, Take 1 tablet by mouth daily., Disp: , Rfl:    buPROPion (WELLBUTRIN XL) 300 MG 24 hr tablet, TAKE 1 TABLET DAILY, Disp: 90 tablet, Rfl: 3   cefdinir (OMNICEF) 300 MG capsule, Take 1 capsule (300 mg total) by mouth 2 (two) times daily., Disp: 10 capsule, Rfl: 0   clotrimazole-betamethasone (LOTRISONE) cream, Apply 1 application topically 2 (two) times daily., Disp: 30 g, Rfl: 0   fluconazole (DIFLUCAN) 150 MG tablet, Take one tablet x 1 day and then may repeat x 1 in three days if persistent symptoms., Disp: 2 tablet, Rfl: 0   fluticasone-salmeterol (ADVAIR HFA) 230-21 MCG/ACT inhaler, Inhale 2 puffs into the lungs 2 (two) times daily., Disp: 1 each, Rfl: 1   FREESTYLE LITE test strip, , Disp: , Rfl:    hyoscyamine (LEVSIN SL) 0.125 MG SL tablet, PLACE 1 TABLET (0.125 MG TOTAL) UNDER THE TONGUE DAILY AS NEEDED., Disp: 30 tablet, Rfl: 0   insulin glargine (LANTUS SOLOSTAR) 100 UNIT/ML Solostar Pen, Inject 26 Units into the skin at bedtime., Disp: , Rfl:    ipratropium (ATROVENT) 0.06 % nasal spray, Place 2 sprays into both nostrils 3 (three) times daily as needed for rhinitis., Disp: , Rfl:    Ipratropium-Albuterol (COMBIVENT) 20-100 MCG/ACT AERS respimat, Inhale 2 puffs into the lungs every 4 (four) hours while awake. (Patient taking differently: Inhale 1 puff into the lungs 2 (two) times daily. And as needed as a rescue inhaler.), Disp: 4 g, Rfl: 2   isosorbide mononitrate (IMDUR) 30 MG 24 hr tablet, Take 1 tablet (30 mg total) by mouth daily., Disp: 30 tablet, Rfl: 0   JARDIANCE 25 MG TABS tablet, Take 25 mg by mouth daily., Disp: , Rfl:    LORazepam (ATIVAN) 0.5 MG tablet, , Disp: , Rfl:    losartan (COZAAR) 25 MG tablet, Take 1 tablet (25 mg total) by mouth daily., Disp: 30 tablet, Rfl: 0   metFORMIN (GLUCOPHAGE) 500 MG tablet, TAKE 2 TABLETS  TWICE A DAY, Disp: 360 tablet, Rfl: 3   metoprolol tartrate (LOPRESSOR) 25 MG tablet, TAKE 1 TABLET TWICE A DAY, Disp: 180 tablet, Rfl: 3   nitroGLYCERIN (NITROSTAT) 0.4 MG SL tablet, Place 1 tablet (0.4 mg total) under the tongue every 5 (five) minutes as needed for chest pain., Disp: 30 tablet, Rfl: 0   phenazopyridine (PYRIDIUM) 200 MG tablet, Take 1 tablet (200 mg total) by mouth 3 (three) times daily as needed for pain., Disp: 6 tablet, Rfl: 0   prasugrel (EFFIENT) 10 MG TABS tablet, Take 1 tablet (10 mg total) by mouth daily., Disp: 30  tablet, Rfl: 0   RABEprazole (ACIPHEX) 20 MG tablet, TAKE 1 TABLET IN THE MORNING AND AT BEDTIME, Disp: 180 tablet, Rfl: 3   rosuvastatin (CRESTOR) 5 MG tablet, TAKE 1 TABLET DAILY, Disp: 90 tablet, Rfl: 3   TRULICITY 3 TA/5.6PV SOPN, Inject 3 mg into the skin once a week., Disp: , Rfl:    ULTRACARE PEN NEEDLES 32G X 4 MM MISC, , Disp: , Rfl:   EXAM:  GENERAL: alert, oriented, appears well and in no acute distress  HEENT: atraumatic, conjunttiva clear, no obvious abnormalities on inspection of external nose and ears  NECK: normal movements of the head and neck  LUNGS: on inspection no signs of respiratory distress, breathing rate appears normal, no obvious gross SOB, gasping or wheezing  CV: no obvious cyanosis  PSYCH/NEURO: pleasant and cooperative, no obvious depression or anxiety, speech and thought processing grossly intact  ASSESSMENT AND PLAN:  Discussed the following assessment and plan:  Problem List Items Addressed This Visit     Diabetes mellitus ([place])    Sugars doing well per report.  Continue to stay hydrated.  Treat infection.  Follow.       Dysuria - Primary    Symptoms as outlined.  Appear to be c/w UTI.  Reviewed urine.  Treat with omnicef and pyridium.  Await culture results. Stay hydrated.  Follow.  Diflucan prescribed if needed for yeast infection.        Return in about 2 months (around 04/15/2022) for follow up appt  (58mn).   I discussed the assessment and treatment plan with the patient. The patient was provided an opportunity to ask questions and all were answered. The patient agreed with the plan and demonstrated an understanding of the instructions.   The patient was advised to call back or seek an in-person evaluation if the symptoms worsen or if the condition fails to improve as anticipated.   CEinar Pheasant MD

## 2022-02-13 NOTE — Telephone Encounter (Signed)
Pt advised Will come to drop off urine for UA and UCX Added to sched for 430 virtual  Orders placed

## 2022-02-14 LAB — URINALYSIS, MICROSCOPIC ONLY: RBC / HPF: NONE SEEN (ref 0–?)

## 2022-02-14 LAB — URINE CULTURE
MICRO NUMBER:: 14155047
SPECIMEN QUALITY:: ADEQUATE

## 2022-02-18 ENCOUNTER — Encounter: Payer: Self-pay | Admitting: Internal Medicine

## 2022-02-18 NOTE — Assessment & Plan Note (Signed)
Symptoms as outlined.  Appear to be c/w UTI.  Reviewed urine.  Treat with omnicef and pyridium.  Await culture results. Stay hydrated.  Follow.  Diflucan prescribed if needed for yeast infection.

## 2022-02-18 NOTE — Assessment & Plan Note (Signed)
Sugars doing well per report.  Continue to stay hydrated.  Treat infection.  Follow.

## 2022-04-09 HISTORY — PX: MOUTH SURGERY: SHX715

## 2022-06-19 ENCOUNTER — Ambulatory Visit
Admission: RE | Admit: 2022-06-19 | Discharge: 2022-06-19 | Disposition: A | Discharge status: Home / Self Care | Payer: Medicare Other | Source: Ambulatory Visit | Attending: Family Medicine | Admitting: Family Medicine

## 2022-06-19 ENCOUNTER — Other Ambulatory Visit: Payer: Self-pay | Admitting: Family Medicine

## 2022-06-19 DIAGNOSIS — M7541 Impingement syndrome of right shoulder: Secondary | ICD-10-CM | POA: Diagnosis present

## 2022-06-27 ENCOUNTER — Other Ambulatory Visit: Payer: Self-pay | Admitting: Internal Medicine

## 2022-07-11 ENCOUNTER — Telehealth: Payer: Self-pay | Admitting: Internal Medicine

## 2022-07-11 NOTE — Telephone Encounter (Signed)
Contacted Burt Knack to schedule their annual wellness visit. Appointment made for 07/16/2022.  Thank you,  [place] Direct dial  [phone]

## 2022-07-16 ENCOUNTER — Ambulatory Visit (INDEPENDENT_AMBULATORY_CARE_PROVIDER_SITE_OTHER): Payer: Medicare Other

## 2022-07-16 VITALS — Ht 65.5 in | Wt 216.0 lb

## 2022-07-16 DIAGNOSIS — Z780 Asymptomatic menopausal state: Secondary | ICD-10-CM | POA: Diagnosis not present

## 2022-07-16 DIAGNOSIS — Z0000 Encounter for general adult medical examination without abnormal findings: Secondary | ICD-10-CM

## 2022-07-16 DIAGNOSIS — Z1231 Encounter for screening mammogram for malignant neoplasm of breast: Secondary | ICD-10-CM

## 2022-07-16 NOTE — Progress Notes (Signed)
Subjective:   Amanda Wiley is a 67 y.o. female who presents for Medicare Annual (Subsequent) preventive examination.  Review of Systems    No ROS.  Medicare Wellness Virtual Visit.  Visual/audio telehealth visit, UTA vital signs.   See social history for additional risk factors.   Cardiac Risk Factors include: advanced age (>40men, >44 women)     Objective:    Today's Vitals   07/16/22 1224  Weight: 216 lb (98 kg)  Height: 5' 5.5" (1.664 m)   Body mass index is 35.4 kg/m.     07/16/2022   12:31 PM 07/11/2021    2:29 PM 11/22/2020    2:07 PM 11/16/2020    5:40 PM 03/21/2020    1:39 PM 03/10/2020    2:31 PM 03/09/2020    9:14 PM  Advanced Directives  Does Patient Have a Medical Advance Directive? No No No No No  No  Would patient like information on creating a medical advance directive? No - Patient declined No - Patient declined   No - Patient declined No - Patient declined No - Patient declined    Current Medications (verified) Outpatient Encounter Medications as of 07/16/2022  Medication Sig   acetaminophen (TYLENOL) 325 MG tablet Take 2 tablets (650 mg total) by mouth every 6 (six) hours as needed for mild pain (or Fever >/= 101).   amLODipine (NORVASC) 5 MG tablet Take 1 tablet by mouth daily.   buPROPion (WELLBUTRIN XL) 300 MG 24 hr tablet TAKE 1 TABLET DAILY   cefdinir (OMNICEF) 300 MG capsule Take 1 capsule (300 mg total) by mouth 2 (two) times daily.   clotrimazole-betamethasone (LOTRISONE) cream Apply 1 application topically 2 (two) times daily.   fluconazole (DIFLUCAN) 150 MG tablet Take one tablet x 1 day and then may repeat x 1 in three days if persistent symptoms.   fluticasone-salmeterol (ADVAIR HFA) 230-21 MCG/ACT inhaler Inhale 2 puffs into the lungs 2 (two) times daily.   FREESTYLE LITE test strip    hyoscyamine (LEVSIN SL) 0.125 MG SL tablet PLACE 1 TABLET (0.125 MG TOTAL) UNDER THE TONGUE DAILY AS NEEDED.   insulin glargine (LANTUS SOLOSTAR) 100 UNIT/ML  Solostar Pen Inject 26 Units into the skin at bedtime.   ipratropium (ATROVENT) 0.06 % nasal spray Place 2 sprays into both nostrils 3 (three) times daily as needed for rhinitis.   Ipratropium-Albuterol (COMBIVENT) 20-100 MCG/ACT AERS respimat Inhale 2 puffs into the lungs every 4 (four) hours while awake. (Patient taking differently: Inhale 1 puff into the lungs 2 (two) times daily. And as needed as a rescue inhaler.)   isosorbide mononitrate (IMDUR) 30 MG 24 hr tablet Take 1 tablet (30 mg total) by mouth daily.   JARDIANCE 25 MG TABS tablet Take 25 mg by mouth daily.   LORazepam (ATIVAN) 0.5 MG tablet    losartan (COZAAR) 25 MG tablet Take 1 tablet (25 mg total) by mouth daily.   metFORMIN (GLUCOPHAGE) 500 MG tablet TAKE 2 TABLETS TWICE A DAY   metoprolol tartrate (LOPRESSOR) 25 MG tablet TAKE 1 TABLET TWICE A DAY   nitroGLYCERIN (NITROSTAT) 0.4 MG SL tablet Place 1 tablet (0.4 mg total) under the tongue every 5 (five) minutes as needed for chest pain.   phenazopyridine (PYRIDIUM) 200 MG tablet Take 1 tablet (200 mg total) by mouth 3 (three) times daily as needed for pain.   prasugrel (EFFIENT) 10 MG TABS tablet Take 1 tablet (10 mg total) by mouth daily.   RABEprazole (ACIPHEX) 20 MG  tablet TAKE 1 TABLET IN THE MORNING AND AT BEDTIME   rosuvastatin (CRESTOR) 5 MG tablet TAKE 1 TABLET DAILY   TRULICITY 3 MG/0.5ML SOPN Inject 3 mg into the skin once a week.   ULTRACARE PEN NEEDLES 32G X 4 MM MISC    No facility-administered encounter medications on file as of 07/16/2022.    Allergies (verified) Avelox [moxifloxacin hcl in nacl], Moxifloxacin, 5-alpha reductase inhibitors, Allegra [fexofenadine hcl], Ibuprofen, Sulfa antibiotics, Tegretol [carbamazepine], Iodine, Ioxaglate, and Ivp dye [iodinated contrast media]   History: Past Medical History:  Diagnosis Date   Arthritis    Asthma    Complication of anesthesia    HARD TO WAKE UP   COPD (chronic obstructive pulmonary disease)    Crohn's  disease    Depression    Diabetes mellitus    Fracture, foot    post fall   Hypercholesterolemia    Hypertension    IBS (irritable bowel syndrome)    Nephrolithiasis    s/p lithotripsy [facility])   Vitamin D deficiency    Past Surgical History:  Procedure Laterality Date   ABDOMINAL HYSTERECTOMY  1985   APPENDECTOMY  1985   BREAST DUCTAL SYSTEM EXCISION Left 09/04/2019   Procedure: EXCISION DUCTAL SYSTEM BREAST;  Surgeon: Earline Mayotte, MD;  Location: [facility] ORS;  Service: General;  Laterality: Left;   BREAST EXCISIONAL BIOPSY Right 2013   benign   COLONOSCOPY  2014   COLONOSCOPY WITH PROPOFOL N/A 02/10/2020   Procedure: COLONOSCOPY WITH PROPOFOL;  Surgeon: Earline Mayotte, MD;  Location: [facility];  Service: Endoscopy;  Laterality: N/A;   CORONARY STENT INTERVENTION N/A 03/07/2020   Procedure: CORONARY STENT INTERVENTION;  Surgeon: Marcina Millard, MD;  Location: [facility];  Service: Cardiovascular;  Laterality: N/A;   EXCISION / BIOPSY BREAST / NIPPLE / DUCT Left 2019   papilloma   EXCISION OF BREAST BIOPSY Left 2019   papilloma   LAPAROSCOPIC CHOLECYSTECTOMY  2005   Dr Katrinka Blazing   LAPAROSCOPIC SUPRACERVICAL HYSTERECTOMY  1985   left ovary not removed   LEFT HEART CATH AND CORONARY ANGIOGRAPHY N/A 03/07/2020   Procedure: LEFT HEART CATH AND CORONARY ANGIOGRAPHY;  Surgeon: Marcina Millard, MD;  Location: [facility];  Service: Cardiovascular;  Laterality: N/A;   LEFT HEART CATH AND CORONARY ANGIOGRAPHY N/A 03/10/2020   Procedure: LEFT HEART CATH AND CORONARY ANGIOGRAPHY and possible PCI and stent;  Surgeon: Alwyn Pea, MD;  Location: [facility];  Service: Cardiovascular;  Laterality: N/A;   LITHOTRIPSY     x7   OOPHORECTOMY     POLYPECTOMY     SHOULDER SURGERY     right (pinning)   SHOULDER SURGERY     left (pinning)   TONSILLECTOMY  1966   Family History  Problem Relation Age of Onset   Heart disease  Father    Emphysema Maternal Grandmother    COPD Mother    Hypercholesterolemia Mother    Emphysema Mother    Hypertension Brother    Emphysema Other        grandmother   COPD Sister    Crohn's disease Sister    Hypertension Sister    Breast cancer Neg Hx    Colon cancer Neg Hx    Social History   Socioeconomic History   Marital status: Married    Spouse name: Not on file   Number of children: 2   Years of education: Not on file   Highest education level:  Not on file  Occupational History    Employer: [employer]  Tobacco Use   Smoking status: Former    Packs/day: 2.00    Years: 15.00    Additional pack years: 0.00    Total pack years: 30.00    Types: Cigarettes    Quit date: 04/09/1997    Years since quitting: 25.2   Smokeless tobacco: Never  Vaping Use   Vaping Use: Never used  Substance and Sexual Activity   Alcohol use: Never    Alcohol/week: 0.0 standard drinks of alcohol   Drug use: No   Sexual activity: Not on file  Other Topics Concern   Not on file  Social History Narrative   Not on file   Social Determinants of Health   Financial Resource Strain: Low Risk  (07/16/2022)   Overall Financial Resource Strain (CARDIA)    Difficulty of Paying Living Expenses: Not hard at all  Food Insecurity: No Food Insecurity (07/16/2022)   Hunger Vital Sign    Worried About Running Out of Food in the Last Year: Never true    Ran Out of Food in the Last Year: Never true  Transportation Needs: No Transportation Needs (07/16/2022)   PRAPARE - Administrator, Civil Service (Medical): No    Lack of Transportation (Non-Medical): No  Physical Activity: Unknown (07/16/2022)   Exercise Vital Sign    Days of Exercise per Week: 0 days    Minutes of Exercise per Session: Not on file  Stress: No Stress Concern Present (07/16/2022)   [employer] of Occupational Health - Occupational Stress Questionnaire    Feeling of Stress : Not at all  Social Connections: Unknown  (07/16/2022)   Social Connection and Isolation Panel [NHANES]    Frequency of Communication with Friends and Family: Not on file    Frequency of Social Gatherings with Friends and Family: Not on file    Attends Religious Services: Not on file    Active Member of Clubs or Organizations: Not on file    Attends Banker Meetings: Not on file    Marital Status: Married    Tobacco Counseling Counseling given: Not Answered   Clinical Intake:  Pre-visit preparation completed: Yes        Diabetes: Yes (Followed by Endocrinology, Dr. Tedd Sias)  How often do you need to have someone help you when you read instructions, pamphlets, or other written materials from your doctor or pharmacy?: 1 - Never    Interpreter Needed?: No      Activities of Daily Living    07/16/2022   12:31 PM  In your present state of health, do you have any difficulty performing the following activities:  Hearing? 0  Vision? 0  Difficulty concentrating or making decisions? 0  Walking or climbing stairs? 0  Dressing or bathing? 0  Doing errands, shopping? 0  Preparing Food and eating ? N  Using the Toilet? N  In the past six months, have you accidently leaked urine? N  Do you have problems with loss of bowel control? N  Managing your Medications? N  Managing your Finances? N  Housekeeping or managing your Housekeeping? N    Patient Care Team: Dale Booker, MD as PCP - General (Internal Medicine)  Indicate any recent Medical Services you may have received from other than [facility] providers in the past year (date may be approximate).     Assessment:   This is a routine wellness examination for Zissy.  I  connected with  Purvis Kilts on 07/16/22 by a audio enabled telemedicine application and verified that I am speaking with the correct person using two identifiers.  Patient Location: Home  Provider Location: Office/Clinic  I discussed the limitations of evaluation and management by  telemedicine. The patient expressed understanding and agreed to proceed.   Hearing/Vision screen Hearing Screening - Comments:: Patient is able to hear conversational tones without difficulty.  No issues reported.   Vision Screening - Comments:: Followed by [facility] Wears corrective lenses No retinopathy  They have seen their ophthalmologist in the last 12 months.    Dietary issues and exercise activities discussed: Current Exercise Habits: The patient has a physically strenuous job, but has no regular exercise apart from work.   Goals Addressed             This Visit's Progress    A1C within range   On track    Portion control meals Reduce sugar intake       Depression Screen    07/16/2022   12:28 PM 12/08/2021    9:32 AM 07/11/2021    2:27 PM 03/13/2021    2:07 PM 11/29/2020    9:13 AM 08/12/2020   11:44 AM 06/30/2020    1:33 PM  PHQ 2/9 Scores  PHQ - 2 Score 0 0 0 0 0 0 2  PHQ- 9 Score     0  6    Fall Risk    07/16/2022   12:31 PM 12/08/2021    9:32 AM 07/11/2021    2:31 PM 03/13/2021    2:07 PM 08/12/2020   11:44 AM  Fall Risk   Falls in the past year? 0 0 0 0 0  Number falls in past yr: 0 0  0 0  Injury with Fall? 0 0  0 0  Risk for fall due to :  No Fall Risks     Follow up Falls evaluation completed;Falls prevention discussed Falls evaluation completed Falls evaluation completed  Falls evaluation completed    FALL RISK PREVENTION PERTAINING TO THE HOME: Home free of loose throw rugs in walkways, pet beds, electrical cords, etc? Yes  Adequate lighting in your home to reduce risk of falls? Yes   ASSISTIVE DEVICES UTILIZED TO PREVENT FALLS: Life alert? No  Use of a cane, walker or w/c? No  Grab bars in the bathroom? Yes  Shower chair or bench in shower? Yes  Comfort chair height toilet? No   TIMED UP AND GO: Was the test performed? No .   Cognitive Function:        07/16/2022   12:32 PM  6CIT Screen  What Year? 0 points  What month? 0 points   What time? 0 points  Count back from 20 0 points  Months in reverse 0 points  Repeat phrase 0 points  Total Score 0 points    Immunizations Immunization History  Administered Date(s) Administered   Fluad Quad(high Dose 65+) 02/12/2020   Influenza Split 12/30/2013, 01/23/2016   Influenza,inj,Quad PF,6+ Mos 02/11/2018, 02/10/2019   Influenza-Unspecified 01/24/2015, 11/29/2020   PFIZER Comirnaty(Gray Top)Covid-19 Tri-Sucrose Vaccine 06/29/2019   PFIZER(Purple Top)SARS-COV-2 Vaccination 06/29/2019, 07/27/2019, 04/05/2020   PNEUMOCOCCAL CONJUGATE-20 08/12/2020   Tdap 11/16/2020    Covid-19 vaccine status: Completed vaccines x4.  Shingrix Completed?: No.    Education has been provided regarding the importance of this vaccine. Patient has been advised to call insurance company to determine out of pocket expense if they have not  yet received this vaccine. Advised may also receive vaccine at local pharmacy or Health Dept. Verbalized acceptance and understanding.  Screening Tests Health Maintenance  Topic Date Due   DEXA SCAN  Never done   FOOT EXAM  03/13/2022   HEMOGLOBIN A1C  05/19/2022   Diabetic kidney evaluation - eGFR measurement  06/23/2022   COVID-19 Vaccine (5 - 2023-24 season) 08/01/2022 (Originally 12/08/2021)   Zoster Vaccines- Shingrix (1 of 2) 10/15/2022 (Originally 01/26/1975)   Hepatitis C Screening  02/14/2023 (Originally 01/26/1974)   MAMMOGRAM  08/29/2022   INFLUENZA VACCINE  11/08/2022   Diabetic kidney evaluation - Urine ACR  11/17/2022   OPHTHALMOLOGY EXAM  11/18/2022   Medicare Annual Wellness (AWV)  07/16/2023   COLONOSCOPY (Pts 45-424yrs Insurance coverage will need to be confirmed)  02/09/2030   DTaP/Tdap/Td (2 - Td or Tdap) 11/17/2030   Pneumonia Vaccine 6865+ Years old  Completed   HPV VACCINES  Aged Out    Health Maintenance Health Maintenance Due  Topic Date Due   DEXA SCAN  Never done   FOOT EXAM  03/13/2022   HEMOGLOBIN A1C  05/19/2022   Diabetic  kidney evaluation - eGFR measurement  06/23/2022   Mammogram- ordered per consent. Number provided for scheduling [phone].  Dexa Scan- ordered per consent.  Lung Cancer Screening: (Low Dose CT Chest recommended if Age 59-80 years, 30 pack-year currently smoking OR have quit w/in 15years.) does not qualify.   Vision Screening: Recommended annual ophthalmology exams for early detection of glaucoma and other disorders of the eye.  Dental Screening: Recommended annual dental exams for proper oral hygiene  Community Resource Referral / Chronic Care Management: CRR required this visit?  No   CCM required this visit?  No      Plan:     I have personally reviewed and noted the following in the patient's chart:   Medical and social history Use of alcohol, tobacco or illicit drugs  Current medications and supplements including opioid prescriptions. Patient is not currently taking opioid prescriptions. Functional ability and status Nutritional status Physical activity Advanced directives List of other physicians Hospitalizations, surgeries, and ER visits in previous 12 months Vitals Screenings to include cognitive, depression, and falls Referrals and appointments  In addition, I have reviewed and discussed with patient certain preventive protocols, quality metrics, and best practice recommendations. A written personalized care plan for preventive services as well as general preventive health recommendations were provided to patient.     Cathey EndowDenisa L Motley, LPN   0/9/81194/11/2022

## 2022-07-16 NOTE — Patient Instructions (Addendum)
Amanda Wiley , Thank you for taking time to come for your Medicare Wellness Visit. I appreciate your ongoing commitment to your health goals. Please review the following plan we discussed and let me know if I can assist you in the future.   These are the goals we discussed:  Goals      A1C within range     Portion control meals Reduce sugar intake     Patient advised to follow up with PCP for AWV and vaccines        This is a list of the screening recommended for you and due dates:  Health Maintenance  Topic Date Due   DEXA scan (bone density measurement)  Never done   Complete foot exam   03/13/2022   Hemoglobin A1C  05/19/2022   Yearly kidney function blood test for diabetes  06/23/2022   COVID-19 Vaccine (5 - 2023-24 season) 08/01/2022*   Zoster (Shingles) Vaccine (1 of 2) 10/15/2022*   Hepatitis C Screening: USPSTF Recommendation to screen - Ages 18-79 yo.  02/14/2023*   Mammogram  08/29/2022   Flu Shot  11/08/2022   Yearly kidney health urinalysis for diabetes  11/17/2022   Eye exam for diabetics  11/18/2022   Medicare Annual Wellness Visit  07/16/2023   Colon Cancer Screening  02/09/2030   DTaP/Tdap/Td vaccine (2 - Td or Tdap) 11/17/2030   Pneumonia Vaccine  Completed   HPV Vaccine  Aged Out  *Topic was postponed. The date shown is not the original due date.   Conditions/risks identified: none new  Next appointment: Follow up in one year for your annual wellness visit    Preventive Care 65 Years and Older, Female Preventive care refers to lifestyle choices and visits with your health care provider that can promote health and wellness. What does preventive care include? A yearly physical exam. This is also called an annual well check. Dental exams once or twice a year. Routine eye exams. Ask your health care provider how often you should have your eyes checked. Personal lifestyle choices, including: Daily care of your teeth and gums. Regular physical  activity. Eating a healthy diet. Avoiding tobacco and drug use. Limiting alcohol use. Practicing safe sex. Taking low-dose aspirin every day. Taking vitamin and mineral supplements as recommended by your health care provider. What happens during an annual well check? The services and screenings done by your health care provider during your annual well check will depend on your age, overall health, lifestyle risk factors, and family history of disease. Counseling  Your health care provider may ask you questions about your: Alcohol use. Tobacco use. Drug use. Emotional well-being. Home and relationship well-being. Sexual activity. Eating habits. History of falls. Memory and ability to understand (cognition). Work and work Astronomer. Reproductive health. Screening  You may have the following tests or measurements: Height, weight, and BMI. Blood pressure. Lipid and cholesterol levels. These may be checked every 5 years, or more frequently if you are over 67 years old. Skin check. Lung cancer screening. You may have this screening every year starting at age 10 if you have a 30-pack-year history of smoking and currently smoke or have quit within the past 15 years. Fecal occult blood test (FOBT) of the stool. You may have this test every year starting at age 56. Flexible sigmoidoscopy or colonoscopy. You may have a sigmoidoscopy every 5 years or a colonoscopy every 10 years starting at age 16. Hepatitis C blood test. Hepatitis B blood test. Sexually transmitted disease (  STD) testing. Diabetes screening. This is done by checking your blood sugar (glucose) after you have not eaten for a while (fasting). You may have this done every 1-3 years. Bone density scan. This is done to screen for osteoporosis. You may have this done starting at age 28. Mammogram. This may be done every 1-2 years. Talk to your health care provider about how often you should have regular mammograms. Talk with your  health care provider about your test results, treatment options, and if necessary, the need for more tests. Vaccines  Your health care provider may recommend certain vaccines, such as: Influenza vaccine. This is recommended every year. Tetanus, diphtheria, and acellular pertussis (Tdap, Td) vaccine. You may need a Td booster every 10 years. Zoster vaccine. You may need this after age 74. Pneumococcal 13-valent conjugate (PCV13) vaccine. One dose is recommended after age 15. Pneumococcal polysaccharide (PPSV23) vaccine. One dose is recommended after age 45. Talk to your health care provider about which screenings and vaccines you need and how often you need them. This information is not intended to replace advice given to you by your health care provider. Make sure you discuss any questions you have with your health care provider. Document Released: 04/22/2015 Document Revised: 12/14/2015 Document Reviewed: 01/25/2015 Elsevier Interactive Patient Education  2017 [place] Prevention in the Home Falls can cause injuries. They can happen to people of all ages. There are many things you can do to make your home safe and to help prevent falls. What can I do on the outside of my home? Regularly fix the edges of walkways and driveways and fix any cracks. Remove anything that might make you trip as you walk through a door, such as a raised step or threshold. Trim any bushes or trees on the path to your home. Use bright outdoor lighting. Clear any walking paths of anything that might make someone trip, such as rocks or tools. Regularly check to see if handrails are loose or broken. Make sure that both sides of any steps have handrails. Any raised decks and porches should have guardrails on the edges. Have any leaves, snow, or ice cleared regularly. Use sand or salt on walking paths during winter. Clean up any spills in your garage right away. This includes oil or grease spills. What can I  do in the bathroom? Use night lights. Install grab bars by the toilet and in the tub and shower. Do not use towel bars as grab bars. Use non-skid mats or decals in the tub or shower. If you need to sit down in the shower, use a plastic, non-slip stool. Keep the floor dry. Clean up any water that spills on the floor as soon as it happens. Remove soap buildup in the tub or shower regularly. Attach bath mats securely with double-sided non-slip rug tape. Do not have throw rugs and other things on the floor that can make you trip. What can I do in the bedroom? Use night lights. Make sure that you have a light by your bed that is easy to reach. Do not use any sheets or blankets that are too big for your bed. They should not hang down onto the floor. Have a firm chair that has side arms. You can use this for support while you get dressed. Do not have throw rugs and other things on the floor that can make you trip. What can I do in the kitchen? Clean up any spills right away. Avoid walking on  wet floors. Keep items that you use a lot in easy-to-reach places. If you need to reach something above you, use a strong step stool that has a grab bar. Keep electrical cords out of the way. Do not use floor polish or wax that makes floors slippery. If you must use wax, use non-skid floor wax. Do not have throw rugs and other things on the floor that can make you trip. What can I do with my stairs? Do not leave any items on the stairs. Make sure that there are handrails on both sides of the stairs and use them. Fix handrails that are broken or loose. Make sure that handrails are as long as the stairways. Check any carpeting to make sure that it is firmly attached to the stairs. Fix any carpet that is loose or worn. Avoid having throw rugs at the top or bottom of the stairs. If you do have throw rugs, attach them to the floor with carpet tape. Make sure that you have a light switch at the top of the stairs  and the bottom of the stairs. If you do not have them, ask someone to add them for you. What else can I do to help prevent falls? Wear shoes that: Do not have high heels. Have rubber bottoms. Are comfortable and fit you well. Are closed at the toe. Do not wear sandals. If you use a stepladder: Make sure that it is fully opened. Do not climb a closed stepladder. Make sure that both sides of the stepladder are locked into place. Ask someone to hold it for you, if possible. Clearly mark and make sure that you can see: Any grab bars or handrails. First and last steps. Where the edge of each step is. Use tools that help you move around (mobility aids) if they are needed. These include: Canes. Walkers. Scooters. Crutches. Turn on the lights when you go into a dark area. Replace any light bulbs as soon as they burn out. Set up your furniture so you have a clear path. Avoid moving your furniture around. If any of your floors are uneven, fix them. If there are any pets around you, be aware of where they are. Review your medicines with your doctor. Some medicines can make you feel dizzy. This can increase your chance of falling. Ask your doctor what other things that you can do to help prevent falls. This information is not intended to replace advice given to you by your health care provider. Make sure you discuss any questions you have with your health care provider. Document Released: 01/20/2009 Document Revised: 09/01/2015 Document Reviewed: 04/30/2014 Elsevier Interactive Patient Education  2017 [employer]   Bone Density Test A bone density test uses a type of X-ray to measure the amount of calcium and other minerals in a person's bones. It can measure bone density in the hip and the spine. The test is similar to having a regular X-ray. This test may also be called: Bone densitometry. Bone mineral density test. Dual-energy X-ray absorptiometry (DEXA). You may have this test  to: Diagnose a condition that causes weak or thin bones (osteoporosis). Screen you for osteoporosis. Predict your risk for a broken bone (fracture). Determine how well your osteoporosis treatment is working. Tell a health care provider about: Any allergies you have. All medicines you are taking, including vitamins, herbs, eye drops, creams, and over-the-counter medicines. Any problems you or family members have had with anesthetic medicines. Any blood disorders you have. Any surgeries  you have had. Any medical conditions you have. Whether you are pregnant or may be pregnant. Any medical tests you have had within the past 14 days that used contrast material. What are the risks? Generally, this is a safe test. However, it does expose you to a small amount of radiation, which can slightly increase your cancer risk. What happens before the test? Do not take any calcium supplements within the 24 hours before your test. You will need to remove all metal jewelry, eyeglasses, removable dental appliances, and any other metal objects on your body. What happens during the test?  You will lie down on an exam table. There will be an X-ray generator below you and an imaging device above you. Other devices, such as boxes or braces, may be used to position your body properly for the scan. The machine will slowly scan your body. You will need to keep very still while the machine does the scan. The images will show up on a screen in the room. Images will be examined by a specialist after your test is finished. The procedure may vary among health care providers and hospitals. What can I expect after the test? It is up to you to get the results of your test. Ask your health care provider, or the department that is doing the test, when your results will be ready. Summary A bone density test is an imaging test that uses a type of X-ray to measure the amount of calcium and other minerals in your bones. The  test may be used to diagnose or screen you for a condition that causes weak or thin bones (osteoporosis), predict your risk for a broken bone (fracture), or determine how well your osteoporosis treatment is working. Do not take any calcium supplements within 24 hours before your test. Ask your health care provider, or the department that is doing the test, when your results will be ready. This information is not intended to replace advice given to you by your health care provider. Make sure you discuss any questions you have with your health care provider. Document Revised: 12/07/2020 Document Reviewed: 09/10/2019 Elsevier Patient Education  2023 [employer] Inc[employer]  Mammogram A mammogram is an X-ray of the breasts. This procedure can screen for and detect any changes that may indicate breast cancer. Mammograms are regularly done beginning at age 55 for women with average risk. A man may have a mammogram if he has a lump or swelling in his breast tissue. A mammogram can also identify other changes and variations in the breast, such as: Inflammation of the breast tissue (mastitis). An infected area that contains a collection of pus (abscess). A fluid-filled sac (cyst). Tumors that are not cancerous (benign). Fibrocystic changes. This is when breast tissue becomes denser and can make the tissue feel rope-like or uneven under the skin. Women at higher risk for breast cancer need earlier and more comprehensive screening for abnormal changes. Breast tomosynthesis, or three-dimensional (3D) mammography, and digital breast tomosynthesis are advanced forms of imaging that create 3D pictures of the breasts. Tell a health care provider: About any allergies you have. If you have breast implants. If you have had previous breast disease, biopsy, or surgery. If you have a family history of breast cancer. If you are breastfeeding. Whether you are pregnant or may be pregnant. What are the risks? Generally, this  is a safe procedure. However, problems may occur, including: Exposure to radiation. Radiation levels are very low with this test. The need  for more tests. The mammogram fails to detect certain cancers or the results are misinterpreted. Difficulty with detecting breast cancer in women with dense breasts. What happens before the procedure? Schedule your test about 1-2 weeks after your menstrual period if you are menstruating. This is usually when your breasts are the least tender. If you have had a mammogram done at a different facility in the past, get the mammogram X-rays or have them sent to your current exam facility. The new and old images will be compared. Wash your breasts and underarms on the day of the test. Do not wear deodorants, perfumes, lotions, or powders anywhere on your body on the day of the test. Remove any jewelry from your neck. Wear clothes that you can change into and out of easily. What happens during the procedure?  You will undress from the waist up and put on a gown that opens in the front. You will stand in front of the X-ray machine. Each breast will be placed between two plastic or glass plates. The plates will compress your breast for a few seconds. Try to stay as relaxed as possible. This procedure does not cause any harm to your breasts. Any discomfort you feel will be very brief. X-rays will be taken from different angles of each breast. The procedure may vary among health care providers and hospitals. What can I expect after the procedure? The mammogram will be examined by a specialist (radiologist). You may need to repeat certain parts of the test, depending on the quality of the images. This is done if the radiologist needs a better view of the breast tissue. You may resume your normal activities. It is up to you to get the results of your procedure. Ask your health care provider, or the department that is doing the procedure, when your results will be  ready. Summary A mammogram is an X-ray of the breasts. This procedure can screen for and detect any changes that may indicate breast cancer. A man may have a mammogram if he has a lump or swelling in his breast tissue. If you have had a mammogram done at a different facility in the past, get the mammogram X-rays or have them sent to your current exam facility in order to compare them. Schedule your test about 1-2 weeks after your menstrual period if you are menstruating. Ask when your test results will be ready. Make sure you get your test results. This information is not intended to replace advice given to you by your health care provider. Make sure you discuss any questions you have with your health care provider. Document Revised: 12/07/2020 Document Reviewed: 01/25/2020 Elsevier Patient Education  2023 [employer]

## 2022-07-30 ENCOUNTER — Other Ambulatory Visit: Payer: Self-pay | Admitting: Internal Medicine

## 2022-07-30 NOTE — Telephone Encounter (Signed)
Rx ok'd for wellbutrin, metformin and metoprolol x 1 - keep appt

## 2022-08-16 ENCOUNTER — Encounter: Payer: Self-pay | Admitting: Internal Medicine

## 2022-08-16 ENCOUNTER — Ambulatory Visit (INDEPENDENT_AMBULATORY_CARE_PROVIDER_SITE_OTHER): Payer: Medicare Other | Admitting: Internal Medicine

## 2022-08-16 VITALS — BP 130/72 | HR 72 | Temp 97.9°F | Resp 16 | Ht 65.5 in | Wt 217.2 lb

## 2022-08-16 DIAGNOSIS — E7800 Pure hypercholesterolemia, unspecified: Secondary | ICD-10-CM | POA: Diagnosis not present

## 2022-08-16 DIAGNOSIS — J4520 Mild intermittent asthma, uncomplicated: Secondary | ICD-10-CM

## 2022-08-16 DIAGNOSIS — E559 Vitamin D deficiency, unspecified: Secondary | ICD-10-CM | POA: Diagnosis not present

## 2022-08-16 DIAGNOSIS — E1159 Type 2 diabetes mellitus with other circulatory complications: Secondary | ICD-10-CM | POA: Diagnosis not present

## 2022-08-16 DIAGNOSIS — D242 Benign neoplasm of left breast: Secondary | ICD-10-CM

## 2022-08-16 DIAGNOSIS — I10 Essential (primary) hypertension: Secondary | ICD-10-CM | POA: Diagnosis not present

## 2022-08-16 DIAGNOSIS — Z794 Long term (current) use of insulin: Secondary | ICD-10-CM | POA: Diagnosis not present

## 2022-08-16 DIAGNOSIS — R7989 Other specified abnormal findings of blood chemistry: Secondary | ICD-10-CM

## 2022-08-16 DIAGNOSIS — K219 Gastro-esophageal reflux disease without esophagitis: Secondary | ICD-10-CM

## 2022-08-16 DIAGNOSIS — I25119 Atherosclerotic heart disease of native coronary artery with unspecified angina pectoris: Secondary | ICD-10-CM

## 2022-08-16 DIAGNOSIS — D45 Polycythemia vera: Secondary | ICD-10-CM

## 2022-08-16 DIAGNOSIS — J329 Chronic sinusitis, unspecified: Secondary | ICD-10-CM

## 2022-08-16 DIAGNOSIS — K760 Fatty (change of) liver, not elsewhere classified: Secondary | ICD-10-CM

## 2022-08-16 DIAGNOSIS — K5090 Crohn's disease, unspecified, without complications: Secondary | ICD-10-CM

## 2022-08-16 DIAGNOSIS — Z1231 Encounter for screening mammogram for malignant neoplasm of breast: Secondary | ICD-10-CM

## 2022-08-16 DIAGNOSIS — F32A Depression, unspecified: Secondary | ICD-10-CM

## 2022-08-16 LAB — HM DIABETES FOOT EXAM

## 2022-08-16 MED ORDER — DOXYCYCLINE HYCLATE 100 MG PO TABS: 100.0000 mg | ORAL_TABLET | Freq: Two times a day (BID) | ORAL | 0 refills | Status: DC

## 2022-08-16 MED ORDER — METFORMIN HCL 500 MG PO TABS: 1000.0000 mg | ORAL_TABLET | Freq: Two times a day (BID) | ORAL | 1 refills | Status: DC

## 2022-08-16 MED ORDER — FLUCONAZOLE 150 MG PO TABS: ORAL_TABLET | ORAL | 0 refills | Status: AC

## 2022-08-16 MED ORDER — BUPROPION HCL ER (XL) 300 MG PO TB24: 300.0000 mg | ORAL_TABLET | Freq: Every day | ORAL | 1 refills | Status: DC

## 2022-08-16 MED ORDER — METOPROLOL TARTRATE 25 MG PO TABS: 25.0000 mg | ORAL_TABLET | Freq: Two times a day (BID) | ORAL | 1 refills | Status: DC

## 2022-08-16 MED ORDER — ROSUVASTATIN CALCIUM 5 MG PO TABS: 5.0000 mg | ORAL_TABLET | Freq: Every day | ORAL | 3 refills | Status: DC

## 2022-08-16 NOTE — Progress Notes (Signed)
Subjective:    [name], female    DOB: [birth year], 67 y.o.   MRN: [id number]  Patient here for  Chief Complaint  Patient presents with   Medical Management of Chronic Issues    HPI Here to follow up regarding hypercholesterolemia, diabetes and hypertension.  S/p closed fracture of proximal end of right humerus - h/o left shoulder proximal humerus ORIF - considering reverse shoulder arthroplasty. Recommended PT.  Shoulder is better.  Still spot tenderness. Continue f/u with ortho.  Undergoing dental procedure - teeth extraction and dental implants.  Does report some increased sinus pressure and nasal congestion.  Feels like talking in a barrel.  Green/yellow mucus production.  Ears itching.  No chest congestion or sob reported.  No chest pain reported.  Saw cardiology 04/16/22.  Stable. No changes made.  No abdominal pain or bowel change reported.  States blood sugars 130s.     Past Medical History:  Diagnosis Date   Arthritis    Asthma    Complication of anesthesia    HARD TO WAKE UP   COPD (chronic obstructive pulmonary disease) (HCC)    Crohn's disease (HCC)    Depression    Diabetes mellitus (HCC)    Fracture, foot    post fall   Hypercholesterolemia    Hypertension    IBS (irritable bowel syndrome)    Nephrolithiasis    s/p lithotripsy [facility])   Vitamin D deficiency    Past Surgical History:  Procedure Laterality Date   ABDOMINAL HYSTERECTOMY  1985   APPENDECTOMY  1985   BREAST DUCTAL SYSTEM EXCISION Left 09/04/2019   Procedure: EXCISION DUCTAL SYSTEM BREAST;  Surgeon: Earline Mayotte, MD;  Location: [facility] ORS;  Service: General;  Laterality: Left;   BREAST EXCISIONAL BIOPSY Right 2013   benign   COLONOSCOPY  2014   COLONOSCOPY WITH PROPOFOL N/A 02/10/2020   Procedure: COLONOSCOPY WITH PROPOFOL;  Surgeon: Earline Mayotte, MD;  Location: [facility] ENDOSCOPY;  Service: Endoscopy;  Laterality: N/A;   CORONARY STENT INTERVENTION N/A  03/07/2020   Procedure: CORONARY STENT INTERVENTION;  Surgeon: Marcina Millard, MD;  Location: [facility] INVASIVE CV LAB;  Service: Cardiovascular;  Laterality: N/A;   EXCISION / BIOPSY BREAST / NIPPLE / DUCT Left 2019   papilloma   EXCISION OF BREAST BIOPSY Left 2019   papilloma   LAPAROSCOPIC CHOLECYSTECTOMY  2005   Dr Katrinka Blazing   LAPAROSCOPIC SUPRACERVICAL HYSTERECTOMY  1985   left ovary not removed   LEFT HEART CATH AND CORONARY ANGIOGRAPHY N/A 03/07/2020   Procedure: LEFT HEART CATH AND CORONARY ANGIOGRAPHY;  Surgeon: Marcina Millard, MD;  Location: [facility] INV[facility] CV [facility];  Service: Cardiovascular;  Laterality: N/A;   LEFT HEART CATH AND CORONARY ANGIOGRAPHY N/A 03/10/2020   Procedure: LEFT HEART CATH AND CORONARY ANGIOGRAPHY and possible PCI and stent;  Surgeon: Alwyn Pea, MD;  Location: [facility] IN[facility] CV [facility];  Service: Cardiovascular;  Laterality: N/A;   LITHOTRIPSY     x7   OOPHORECTOMY     POLYPECTOMY     SHOULDER SURGERY     right (pinning)   SHOULDER SURGERY     left (pinning)   TONSILLECTOMY  1966   Family History  Problem Relation Age of Onset   Heart disease Father    Emphysema Maternal Grandmother    COPD Mother    Hypercholesterolemia Mother    Emphysema Mother    Hypertension Brother    Emphysema Other  grandmother   COPD Sister    Crohn's disease Sister    Hypertension Sister    Breast cancer Neg Hx    Colon cancer Neg Hx    Social History   Socioeconomic History   Marital status: Married    Spouse name: Not on file   Number of children: 2   Years of education: Not on file   Highest education level: Not on file  Occupational History    Employer: [employer]  Tobacco Use   Smoking status: Former    Packs/day: 2.00    Years: 15.00    Additional pack years: 0.00    Total pack years: 30.00    Types: Cigarettes    Quit date: 04/09/1997    Years since quitting: 25.3   Smokeless tobacco: Never  Vaping Use   Vaping Use: Never used   Substance and Sexual Activity   Alcohol use: Never    Alcohol/week: 0.0 standard drinks of alcohol   Drug use: No   Sexual activity: Not on file  Other Topics Concern   Not on file  Social History Narrative   Not on file   Social Determinants of Health   Financial Resource Strain: Low Risk  (07/16/2022)   Overall Financial Resource Strain (CARDIA)    Difficulty of Paying Living Expenses: Not hard at all  Food Insecurity: No Food Insecurity (07/16/2022)   Hunger Vital Sign    Worried About Running Out of Food in the Last Year: Never true    Ran Out of Food in the Last Year: Never true  Transportation Needs: No Transportation Needs (07/16/2022)   PRAPARE - Administrator, Civil Service (Medical): No    Lack of Transportation (Non-Medical): No  Physical Activity: Unknown (07/16/2022)   Exercise Vital Sign    Days of Exercise per Week: 0 days    Minutes of Exercise per Session: Not on file  Stress: No Stress Concern Present (07/16/2022)   [employer] of Occupational Health - Occupational Stress Questionnaire    Feeling of Stress : Not at all  Social Connections: Unknown (07/16/2022)   Social Connection and Isolation Panel [NHANES]    Frequency of Communication with Friends and Family: Not on file    Frequency of Social Gatherings with Friends and Family: Not on file    Attends Religious Services: Not on file    Active Member of Clubs or Organizations: Not on file    Attends Banker Meetings: Not on file    Marital Status: Married     Review of Systems  Constitutional:  Negative for appetite change and fever.  HENT:  Positive for congestion, postnasal drip and sinus pressure.   Respiratory:  Negative for cough, chest tightness and shortness of breath.   Cardiovascular:  Negative for chest pain and palpitations.  Gastrointestinal:  Negative for abdominal pain, diarrhea, nausea and vomiting.  Genitourinary:  Negative for difficulty urinating and dysuria.   Musculoskeletal:  Negative for joint swelling and myalgias.       Shoulder issues as outlined.    Skin:  Negative for color change and rash.  Neurological:  Negative for dizziness and headaches.  Psychiatric/Behavioral:  Negative for agitation and dysphoric mood.        Objective:     BP 130/72   Pulse 72   Temp 97.9 F (36.6 C)   Resp 16   Ht 5' 5.5" (1.664 m)   Wt 217 lb 3.2 oz (98.5 kg)  SpO2 98%   BMI 35.59 kg/m  Wt Readings from Last 3 Encounters:  08/16/22 217 lb 3.2 oz (98.5 kg)  07/16/22 216 lb (98 kg)  02/13/22 216 lb (98 kg)    Physical Exam Vitals reviewed.  Constitutional:      General: She is not in acute distress.    Appearance: Normal appearance.  HENT:     Head: Normocephalic and atraumatic.     Right Ear: Ear canal and external ear normal.     Left Ear: Tympanic membrane, ear canal and external ear normal.     Mouth/Throat:     Pharynx: No oropharyngeal exudate or posterior oropharyngeal erythema.  Eyes:     General: No scleral icterus.       Right eye: No discharge.        Left eye: No discharge.     Conjunctiva/sclera: Conjunctivae normal.  Neck:     Thyroid: No thyromegaly.  Cardiovascular:     Rate and Rhythm: Normal rate and regular rhythm.  Pulmonary:     Effort: No respiratory distress.     Breath sounds: Normal breath sounds. No wheezing.  Abdominal:     General: Bowel sounds are normal.     Palpations: Abdomen is soft.     Tenderness: There is no abdominal tenderness.  Musculoskeletal:        General: No swelling or tenderness.     Cervical back: Neck supple. No tenderness.  Lymphadenopathy:     Cervical: No cervical adenopathy.  Skin:    Findings: No erythema or rash.  Neurological:     Mental Status: She is alert.  Psychiatric:        Mood and Affect: Mood normal.        Behavior: Behavior normal.      Outpatient Encounter Medications as of 08/16/2022  Medication Sig   doxycycline (VIBRA-TABS) 100 MG tablet Take 1  tablet (100 mg total) by mouth 2 (two) times daily.   fluconazole (DIFLUCAN) 150 MG tablet Take 1 tablet x 1 and if persistent symptoms may repeat x 1 in 3 days.   acetaminophen (TYLENOL) 325 MG tablet Take 2 tablets (650 mg total) by mouth every 6 (six) hours as needed for mild pain (or Fever >/= 101).   amLODipine (NORVASC) 5 MG tablet Take 1 tablet by mouth daily.   buPROPion (WELLBUTRIN XL) 300 MG 24 hr tablet Take 1 tablet (300 mg total) by mouth daily.   clotrimazole-betamethasone (LOTRISONE) cream Apply 1 application topically 2 (two) times daily.   fluticasone-salmeterol (ADVAIR HFA) 230-21 MCG/ACT inhaler Inhale 2 puffs into the lungs 2 (two) times daily.   FREESTYLE LITE test strip    hyoscyamine (LEVSIN SL) 0.125 MG SL tablet PLACE 1 TABLET (0.125 MG TOTAL) UNDER THE TONGUE DAILY AS NEEDED.   insulin glargine (LANTUS SOLOSTAR) 100 UNIT/ML Solostar Pen Inject 26 Units into the skin at bedtime.   ipratropium (ATROVENT) 0.06 % nasal spray Place 2 sprays into both nostrils 3 (three) times daily as needed for rhinitis.   Ipratropium-Albuterol (COMBIVENT) 20-100 MCG/ACT AERS respimat Inhale 2 puffs into the lungs every 4 (four) hours while awake. (Patient taking differently: Inhale 1 puff into the lungs 2 (two) times daily. And as needed as a rescue inhaler.)   isosorbide mononitrate (IMDUR) 30 MG 24 hr tablet Take 1 tablet (30 mg total) by mouth daily.   JARDIANCE 25 MG TABS tablet Take 25 mg by mouth daily.   LORazepam (ATIVAN) 0.5 MG tablet  losartan (COZAAR) 25 MG tablet Take 1 tablet (25 mg total) by mouth daily.   metFORMIN (GLUCOPHAGE) 500 MG tablet Take 2 tablets (1,000 mg total) by mouth 2 (two) times daily.   metoprolol tartrate (LOPRESSOR) 25 MG tablet Take 1 tablet (25 mg total) by mouth 2 (two) times daily.   nitroGLYCERIN (NITROSTAT) 0.4 MG SL tablet Place 1 tablet (0.4 mg total) under the tongue every 5 (five) minutes as needed for chest pain.   prasugrel (EFFIENT) 10 MG TABS  tablet Take 1 tablet (10 mg total) by mouth daily.   RABEprazole (ACIPHEX) 20 MG tablet TAKE 1 TABLET IN THE MORNING AND AT BEDTIME   rosuvastatin (CRESTOR) 5 MG tablet Take 1 tablet (5 mg total) by mouth daily.   TRULICITY 3 MG/0.5ML SOPN Inject 3 mg into the skin once a week.   ULTRACARE PEN NEEDLES 32G X 4 MM MISC    [DISCONTINUED] buPROPion (WELLBUTRIN XL) 300 MG 24 hr tablet TAKE 1 TABLET DAILY   [DISCONTINUED] cefdinir (OMNICEF) 300 MG capsule Take 1 capsule (300 mg total) by mouth 2 (two) times daily.   [DISCONTINUED] fluconazole (DIFLUCAN) 150 MG tablet Take one tablet x 1 day and then may repeat x 1 in three days if persistent symptoms.   [DISCONTINUED] metFORMIN (GLUCOPHAGE) 500 MG tablet TAKE 2 TABLETS TWICE A DAY   [DISCONTINUED] metoprolol tartrate (LOPRESSOR) 25 MG tablet TAKE 1 TABLET TWICE A DAY   [DISCONTINUED] phenazopyridine (PYRIDIUM) 200 MG tablet Take 1 tablet (200 mg total) by mouth 3 (three) times daily as needed for pain.   [DISCONTINUED] rosuvastatin (CRESTOR) 5 MG tablet TAKE 1 TABLET DAILY   No facility-administered encounter medications on file as of 08/16/2022.     Lab Results  Component Value Date   WBC 8.0 01/12/2022   HGB 14.8 01/12/2022   HCT 45.8 01/12/2022   PLT 161.0 01/12/2022   GLUCOSE 100 (H) 08/16/2022   CHOL 140 08/16/2022   TRIG 164.0 (H) 08/16/2022   HDL 57.60 08/16/2022   LDLDIRECT 78.0 01/23/2021   LDLCALC 49 08/16/2022   ALT 24 08/16/2022   AST 18 08/16/2022   NA 140 08/16/2022   K 4.5 08/16/2022   CL 105 08/16/2022   CREATININE 0.79 08/16/2022   BUN 10 08/16/2022   CO2 23 08/16/2022   TSH 1.44 08/16/2022   INR 0.9 03/06/2020   HGBA1C 7.7 (H) 08/16/2022   MICROALBUR 0.7 08/16/2022    MR SHOULDER RIGHT WO CONTRAST  Result Date: 06/19/2022 CLINICAL DATA:  Right shoulder pain and limited range of motion for 3 weeks. History of prior humerus fracture fixation. EXAM: MRI OF THE RIGHT SHOULDER WITHOUT CONTRAST TECHNIQUE: Multiplanar,  multisequence MR imaging of the shoulder was performed. No intravenous contrast was administered. COMPARISON:  Chest CT from 2021 FINDINGS: Unfortunately, there is significant artifact associated with the humeral fixation hardware there are multiple screws in the humeral head creating significant artifact. I do not see any obvious complicating features. Rotator cuff: Full assessment is limited by artifact but I do not see any obvious full-thickness retracted rotator cuff tear. If symptoms persist and further evaluation is indicated, CT arthrography is recommended for further evaluation. Muscles:  Mild diffuse fatty atrophy of the shoulder musculature. Biceps long head:  Not identified due to artifact. Acromioclavicular Joint: Minimal degenerative changes. The acromion is relatively flat or type 1 in shape. No lateral downsloping subacromial spurring. Glenohumeral Joint: Limited evaluation but suspect mild/moderate degenerative changes. No obvious joint effusion. Labrum:  Poorly visualized. Bones:  No acute bony findings. Other: No subcutaneous lesions. IMPRESSION: 1. Unfortunately, there is significant artifact associated with the humeral fixation hardware. I do not see any obvious complicating features. 2. Full assessment is limited by artifact but I do not see any obvious full-thickness retracted rotator cuff tear. If symptoms persist and further evaluation is indicated, CT arthrography is recommended for further evaluation. 3. Mild diffuse fatty atrophy of the shoulder musculature. 4. Limited evaluation of the long head biceps tendon and glenoid labrum. Electronically Signed   By: Rudie Meyer M.D.   On: 06/19/2022 12:33       Assessment & Plan:  Type 2 diabetes mellitus with other circulatory complication, with long-term current use of insulin (HCC) Assessment & Plan: Sugars doing well per report.  Continue to stay hydrated.  Treat infection.  Continue metformin, trulicity and jardiance. Follow.    Orders: -     Hemoglobin A1c -     Microalbumin / creatinine urine ratio  Hypercholesterolemia without hypertriglyceridemia Assessment & Plan: Continue crestor.  Low cholesterol diet and exercise.  Follow lipid panel and liver function tests.    Orders: -     Lipid panel -     Hepatic function panel  Primary hypertension Assessment & Plan: Continue losartan, metoprolol, imdur and amlodipine.  Blood pressure doing well.  Follow pressures.  Follow metabolic panel.   Orders: -     Basic metabolic panel -     TSH  Visit for screening mammogram  Vitamin D deficiency Assessment & Plan: Follow vitamin D level.    Sinusitis, unspecified chronicity, unspecified location Assessment & Plan: Symptoms appear to be c/w sinus infection.  Feels similar to her previus infections.  Continue inhalers as she is doing.  Steroid nasal spray and saline nasal spray as directed.  Doxycycline as directed.  Probiotic.  Follow.  Call with update.  Rx for diflucan given to cover for possible yeast infection    Polycythemia vera (HCC) Assessment & Plan: Has seen hematology.  Follow cbc.    Papilloma of left breast Assessment & Plan: Saw Dr Lemar Livings.  S/p resection of left retro-areolar ductal structures on 09/04/19.  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  Mammogram 08/29/21 - Birads I.  Continue mammogram screening.    Mild depression (HCC) Assessment & Plan: Continue wellbutrin.  Doing better. Does not feel needs any further intervention at this time.  Follow.     Hepatic steatosis Assessment & Plan: Changes c/w fatty liver on ultrasound.  Diet, exercise and weight loss.  Follow liver function tests.     Gastroesophageal reflux disease without esophagitis Assessment & Plan: Continue aciphex. No upper symptoms reported.    Essential hypertension Assessment & Plan: Continue on amlodipine and metoprolol.  Follow pressures.  Follow metabolic panel.    Crohn's  disease without complication, unspecified gastrointestinal tract location [facility]) Assessment & Plan: Bowels stable. Colonoscopy 02/2020.    Coronary artery disease involving native coronary artery of native heart with angina pectoris [facility]) Assessment & Plan: S/p NSTEMI.  S/p PCI-LAD.  Continue effiant, imdur, aspirin, crestor and metoprolol.  Stable.     Mild intermittent asthma without complication Assessment & Plan: Continue advair and singulair.  Follow.     Abnormal liver function tests Assessment & Plan: Diet and exercise.  Follow liver function tests.    Other orders -     buPROPion HCl ER (XL); Take 1 tablet (300 mg total) by mouth daily.  Dispense: 90 tablet; Refill: 1 -  metFORMIN HCl; Take 2 tablets (1,000 mg total) by mouth 2 (two) times daily.  Dispense: 360 tablet; Refill: 1 -     Metoprolol Tartrate; Take 1 tablet (25 mg total) by mouth 2 (two) times daily.  Dispense: 180 tablet; Refill: 1 -     Rosuvastatin Calcium; Take 1 tablet (5 mg total) by mouth daily.  Dispense: 90 tablet; Refill: 3 -     Doxycycline Hyclate; Take 1 tablet (100 mg total) by mouth 2 (two) times daily.  Dispense: 20 tablet; Refill: 0 -     Fluconazole; Take 1 tablet x 1 and if persistent symptoms may repeat x 1 in 3 days.  Dispense: 2 tablet; Refill: 0     Dale [place], MD

## 2022-08-17 LAB — TSH: TSH: 1.44 u[IU]/mL (ref 0.35–5.50)

## 2022-08-17 LAB — BASIC METABOLIC PANEL
BUN: 10 mg/dL (ref 6–23)
CO2: 23 mEq/L (ref 19–32)
Calcium: 9.5 mg/dL (ref 8.4–10.5)
Chloride: 105 mEq/L (ref 96–112)
Creatinine, Ser: 0.79 mg/dL (ref 0.40–1.20)
GFR: 77.33 mL/min (ref >60.00)
Glucose, Bld: 100 mg/dL — ABNORMAL HIGH (ref 70–99)
Potassium: 4.5 mEq/L (ref 3.5–5.1)
Sodium: 140 mEq/L (ref 135–145)

## 2022-08-17 LAB — HEPATIC FUNCTION PANEL
ALT: 24 U/L (ref 0–35)
AST: 18 U/L (ref 0–37)
Albumin: 4.2 g/dL (ref 3.5–5.2)
Alkaline Phosphatase: 49 U/L (ref 39–117)
Bilirubin, Direct: 0 mg/dL (ref 0.0–0.3)
Total Bilirubin: 0.5 mg/dL (ref 0.2–1.2)
Total Protein: 7.5 g/dL (ref 6.0–8.3)

## 2022-08-17 LAB — MICROALBUMIN / CREATININE URINE RATIO
Creatinine,U: 57.7 mg/dL
Microalb Creat Ratio: 1.2 mg/g (ref 0.0–30.0)
Microalb, Ur: 0.7 mg/dL (ref 0.0–1.9)

## 2022-08-17 LAB — LIPID PANEL
Cholesterol: 140 mg/dL (ref 0–200)
HDL: 57.6 mg/dL (ref >39.00)
LDL Cholesterol: 49 mg/dL (ref 0–99)
NonHDL: 82.28
Total CHOL/HDL Ratio: 2
Triglycerides: 164 mg/dL — ABNORMAL HIGH (ref 0.0–149.0)
VLDL: 32.8 mg/dL (ref 0.0–40.0)

## 2022-08-17 LAB — HEMOGLOBIN A1C: Hgb A1c MFr Bld: 7.7 % — ABNORMAL HIGH (ref 4.6–6.5)

## 2022-08-19 ENCOUNTER — Encounter: Payer: Self-pay | Admitting: Internal Medicine

## 2022-08-19 NOTE — Assessment & Plan Note (Signed)
Bowels stable. Colonoscopy 02/2020.  

## 2022-08-19 NOTE — Assessment & Plan Note (Signed)
Continue on amlodipine and metoprolol.  Follow pressures.  Follow metabolic panel.  

## 2022-08-19 NOTE — Assessment & Plan Note (Signed)
Symptoms appear to be c/w sinus infection.  Feels similar to her previus infections.  Continue inhalers as she is doing.  Steroid nasal spray and saline nasal spray as directed.  Doxycycline as directed.  Probiotic.  Follow.  Call with update.  Rx for diflucan given to cover for possible yeast infection

## 2022-08-19 NOTE — Assessment & Plan Note (Signed)
Continue aciphex. No upper symptoms reported.  

## 2022-08-19 NOTE — Assessment & Plan Note (Signed)
Continue wellbutrin.  Doing better. Does not feel needs any further intervention at this time.  Follow.   

## 2022-08-19 NOTE — Assessment & Plan Note (Signed)
Continue crestor.  Low cholesterol diet and exercise.  Follow lipid panel and liver function tests.  

## 2022-08-19 NOTE — Assessment & Plan Note (Signed)
Saw Dr Lemar Livings.  S/p resection of left retro-areolar ductal structures on 09/04/19.  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  Mammogram 08/29/21 - Birads I.  Continue mammogram screening.

## 2022-08-19 NOTE — Assessment & Plan Note (Signed)
Sugars doing well per report.  Continue to stay hydrated.  Treat infection.  Continue metformin, trulicity and jardiance. Follow.

## 2022-08-19 NOTE — Assessment & Plan Note (Signed)
Follow vitamin D level.  

## 2022-08-19 NOTE — Assessment & Plan Note (Signed)
Has seen hematology.  Follow cbc.  

## 2022-08-19 NOTE — Assessment & Plan Note (Signed)
S/p NSTEMI.  S/p PCI-LAD.  Continue effiant, imdur, aspirin, crestor and metoprolol.  Stable.   

## 2022-08-19 NOTE — Assessment & Plan Note (Signed)
Continue losartan, metoprolol, imdur and amlodipine.  Blood pressure doing well.  Follow pressures.  Follow metabolic panel.  

## 2022-08-19 NOTE — Assessment & Plan Note (Signed)
Diet and exercise.  Follow liver function tests.   

## 2022-08-19 NOTE — Assessment & Plan Note (Signed)
Changes c/w fatty liver on ultrasound.  Diet, exercise and weight loss.  Follow liver function tests.   

## 2022-08-19 NOTE — Assessment & Plan Note (Signed)
Continue advair and singulair.  Follow.   

## 2022-08-20 ENCOUNTER — Telehealth: Payer: Self-pay

## 2022-08-20 NOTE — Telephone Encounter (Signed)
LM for pt to retrn call. Ok to give results.

## 2022-08-20 NOTE — Telephone Encounter (Signed)
Noted  

## 2022-08-20 NOTE — Telephone Encounter (Signed)
-----   Message from Dale [place], MD sent at 08/19/2022  8:43 AM EDT ----- Notify - A1c 7.7 (increased from our last check).  She is seeing Dr Tedd Sias and I think has an upcoming appt.  Please forward labs to Dr Jannett Celestine.  She is follow her diabetes.  Cholesterol levels are ok.  Triglycerides are slightly increased.  Continue low carb diet and exercise.  Kidney function tests, thyroid test and liver function tests are wnl.

## 2022-08-20 NOTE — Telephone Encounter (Signed)
Pt called back and I read the message to her and she verbalized understanding 

## 2022-11-01 ENCOUNTER — Ambulatory Visit
Admission: RE | Admit: 2022-11-01 | Discharge: 2022-11-01 | Disposition: A | Discharge status: Home / Self Care | Payer: Medicare Other | Source: Ambulatory Visit | Attending: Internal Medicine | Admitting: Internal Medicine

## 2022-11-01 DIAGNOSIS — Z780 Asymptomatic menopausal state: Secondary | ICD-10-CM | POA: Diagnosis present

## 2022-11-01 DIAGNOSIS — Z1231 Encounter for screening mammogram for malignant neoplasm of breast: Secondary | ICD-10-CM | POA: Insufficient documentation

## 2022-11-02 ENCOUNTER — Telehealth: Payer: Self-pay

## 2022-11-02 NOTE — Telephone Encounter (Signed)
-----   Message from [place] sent at 11/02/2022  5:56 AM EDT ----- Notify - bone density reveals osteoporosis.  Can schedule an appt to discuss treatment options.

## 2022-11-05 NOTE — Telephone Encounter (Signed)
Ok. Please make note on schedule.

## 2022-11-05 NOTE — Telephone Encounter (Signed)
Patient states she is returning a call from Rita [place], LPN.  I read Dr. Westley Hummer Scott's message to patient.  Patient states she has an appointment scheduled with Dr. Lorin Picket on 12/17/2022, so she will wait to discuss this with her at that time.

## 2022-11-05 NOTE — Telephone Encounter (Signed)
Noted on schedule °

## 2022-11-05 NOTE — Telephone Encounter (Signed)
Ok to discuss osteoporosis treatment with you at her upcoming appt in Sept?

## 2022-11-22 ENCOUNTER — Encounter (INDEPENDENT_AMBULATORY_CARE_PROVIDER_SITE_OTHER): Payer: Self-pay

## 2022-12-03 ENCOUNTER — Telehealth: Payer: Self-pay | Admitting: Internal Medicine

## 2022-12-03 DIAGNOSIS — E7800 Pure hypercholesterolemia, unspecified: Secondary | ICD-10-CM

## 2022-12-03 DIAGNOSIS — E1159 Type 2 diabetes mellitus with other circulatory complications: Secondary | ICD-10-CM

## 2022-12-03 NOTE — Telephone Encounter (Signed)
Labs ordered.

## 2022-12-03 NOTE — Telephone Encounter (Signed)
Patient need lab orders.

## 2022-12-12 ENCOUNTER — Other Ambulatory Visit (INDEPENDENT_AMBULATORY_CARE_PROVIDER_SITE_OTHER): Payer: Medicare Other

## 2022-12-12 DIAGNOSIS — E1159 Type 2 diabetes mellitus with other circulatory complications: Secondary | ICD-10-CM | POA: Diagnosis not present

## 2022-12-12 DIAGNOSIS — E7800 Pure hypercholesterolemia, unspecified: Secondary | ICD-10-CM | POA: Diagnosis not present

## 2022-12-12 DIAGNOSIS — Z794 Long term (current) use of insulin: Secondary | ICD-10-CM

## 2022-12-12 LAB — BASIC METABOLIC PANEL
BUN: 12 mg/dL (ref 6–23)
CO2: 25 meq/L (ref 19–32)
Calcium: 9.2 mg/dL (ref 8.4–10.5)
Chloride: 105 meq/L (ref 96–112)
Creatinine, Ser: 0.76 mg/dL (ref 0.40–1.20)
GFR: 80.82 mL/min (ref >60.00)
Glucose, Bld: 113 mg/dL — ABNORMAL HIGH (ref 70–99)
Potassium: 4.3 meq/L (ref 3.5–5.1)
Sodium: 140 meq/L (ref 135–145)

## 2022-12-12 LAB — HEPATIC FUNCTION PANEL
ALT: 23 U/L (ref 0–35)
AST: 19 U/L (ref 0–37)
Albumin: 3.9 g/dL (ref 3.5–5.2)
Alkaline Phosphatase: 58 U/L (ref 39–117)
Bilirubin, Direct: 0.1 mg/dL (ref 0.0–0.3)
Total Bilirubin: 0.5 mg/dL (ref 0.2–1.2)
Total Protein: 7.3 g/dL (ref 6.0–8.3)

## 2022-12-12 LAB — LIPID PANEL
Cholesterol: 128 mg/dL (ref 0–200)
HDL: 38.3 mg/dL — ABNORMAL LOW (ref >39.00)
LDL Cholesterol: 45 mg/dL (ref 0–99)
NonHDL: 89.29
Total CHOL/HDL Ratio: 3
Triglycerides: 219 mg/dL — ABNORMAL HIGH (ref 0.0–149.0)
VLDL: 43.8 mg/dL — ABNORMAL HIGH (ref 0.0–40.0)

## 2022-12-12 LAB — HEMOGLOBIN A1C: Hgb A1c MFr Bld: 7 % — ABNORMAL HIGH (ref 4.6–6.5)

## 2022-12-13 ENCOUNTER — Other Ambulatory Visit: Payer: Medicare Other

## 2022-12-16 NOTE — Assessment & Plan Note (Signed)
Physical today 12/17/22.  PAP 12/2016 - negative with negative HPV.  Colonoscopy 02/2020.  Mammogram 11/01/22 - birads I.

## 2022-12-16 NOTE — Progress Notes (Signed)
Subjective:    [name], female    DOB: 28-Jul-1955, 67 y.o.   MRN: [id number]  Patient here for  Chief Complaint  Patient presents with   Annual Exam   Osteoporosis    HPI With past history of hypercholesterolemia, hypertension and diabetes, here today for a follow up of these issues as well as for a complete physical exam. Saw Dr Tedd Sias 09/10/22 - on trulicity, lantus, metformin and jardiance.  Insulin increased to 36 units daily.  A1c 7.7.  had f/u with ortho 09/17/22 - limited rom right shoulder. Continue weightbearing as tolerated.  Had f/u with Dr Tim Lair 10/24/22 - breathing stable. Follow up with cardiology 10/29/22 - recommended to discontinue prasugrel and isosorbide.  Stable.  Recently had bone density.  Revealed osteoporosis.  Discussed osteoporosis - treatment options.  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 Tinnitus. Decreased hearing - left ear.  Discussed referral to ENT given decreased hearing and persistent tinnitus.    Past Medical History:  Diagnosis Date   Arthritis    Asthma    Complication of anesthesia    HARD TO WAKE UP   COPD (chronic obstructive pulmonary disease) (HCC)    Crohn's disease (HCC)    Depression    Diabetes mellitus (HCC)    Fracture, foot    post fall   Hypercholesterolemia    Hypertension    IBS (irritable bowel syndrome)    Nephrolithiasis    s/p lithotripsy [facility])   Vitamin D deficiency    Past Surgical History:  Procedure Laterality Date   ABDOMINAL HYSTERECTOMY  1985   APPENDECTOMY  1985   BREAST DUCTAL SYSTEM EXCISION Left 09/04/2019   Procedure: EXCISION DUCTAL SYSTEM BREAST;  Surgeon: Earline Mayotte, MD;  Location: [facility] ORS;  Service: General;  Laterality: Left;   BREAST EXCISIONAL BIOPSY Right 2013   benign   COLONOSCOPY  2014   COLONOSCOPY WITH PROPOFOL N/A 02/10/2020   Procedure: COLONOSCOPY WITH PROPOFOL;  Surgeon: Earline Mayotte, MD;  Location: [facility] ENDOSCOPY;  Service: Endoscopy;   Laterality: N/A;   CORONARY STENT INTERVENTION N/A 03/07/2020   Procedure: CORONARY STENT INTERVENTION;  Surgeon: Marcina Millard, MD;  Location: [facility] INVASIVE CV LAB;  Service: Cardiovascular;  Laterality: N/A;   EXCISION / BIOPSY BREAST / NIPPLE / DUCT Left 2019   papilloma   EXCISION OF BREAST BIOPSY Left 2019   papilloma   LAPAROSCOPIC CHOLECYSTECTOMY  2005   Dr Katrinka Blazing   LAPAROSCOPIC SUPRACERVICAL HYSTERECTOMY  1985   left ovary not removed   LEFT HEART CATH AND CORONARY ANGIOGRAPHY N/A 03/07/2020   Procedure: LEFT HEART CATH AND CORONARY ANGIOGRAPHY;  Surgeon: Marcina Millard, MD;  Location: [facility] INV[facility] CV LAB;  Service: Cardiovascular;  Laterality: N/A;   LEFT HEART CATH AND CORONARY ANGIOGRAPHY N/A 03/10/2020   Procedure: LEFT HEART CATH AND CORONARY ANGIOGRAPHY and possible PCI and stent;  Surgeon: Alwyn Pea, MD;  Location: [facility] INV[facility] CV LAB;  Service: Cardiovascular;  Laterality: N/A;   LITHOTRIPSY     x7   OOPHORECTOMY     POLYPECTOMY     SHOULDER SURGERY     right (pinning)   SHOULDER SURGERY     left (pinning)   TONSILLECTOMY  1966   Family History  Problem Relation Age of Onset   Heart disease Father    Emphysema Maternal Grandmother    COPD Mother    Hypercholesterolemia Mother    Emphysema Mother    Hypertension Brother  Emphysema Other        grandmother   COPD Sister    Crohn's disease Sister    Hypertension Sister    Breast cancer Neg Hx    Colon cancer Neg Hx    Social History   Socioeconomic History   Marital status: Married    Spouse name: Not on file   Number of children: 2   Years of education: Not on file   Highest education level: Not on file  Occupational History    Employer: [employer]  Tobacco Use   Smoking status: Former    Current packs/day: 0.00    Average packs/day: 2.0 packs/day for 15.0 years (30.0 ttl pk-yrs)    Types: Cigarettes    Start date: 04/09/1982    Quit date: 04/09/1997    Years since  quitting: 25.7   Smokeless tobacco: Never  Vaping Use   Vaping status: Never Used  Substance and Sexual Activity   Alcohol use: Never    Alcohol/week: 0.0 standard drinks of alcohol   Drug use: No   Sexual activity: Not on file  Other Topics Concern   Not on file  Social History Narrative   Not on file   Social Determinants of Health   Financial Resource Strain: Low Risk  (07/22/2022)   Received from [facility], [facility]   Overall Financial Resource Strain (CARDIA)    Difficulty of Paying Living Expenses: Not hard at all  Food Insecurity: No Food Insecurity (07/22/2022)   Received from [facility], [facility]   Hunger Vital Sign    Worried About Running Out of Food in the Last Year: Never true    Ran Out of Food in the Last Year: Never true  Transportation Needs: No Transportation Needs (07/22/2022)   Received from [facility], [facility]   PRAPARE - Transportation    In the past 12 months, has lack of transportation kept you from medical appointments or from getting medications?: No    Lack of Transportation (Non-Medical): No  Physical Activity: Unknown (07/16/2022)   Exercise Vital Sign    Days of Exercise per Week: 0 days    Minutes of Exercise per Session: Not on file  Stress: No Stress Concern Present (07/16/2022)   [employer] of Occupational Health - Occupational Stress Questionnaire    Feeling of Stress : Not at all  Social Connections: Unknown (07/16/2022)   Social Connection and Isolation Panel [NHANES]    Frequency of Communication with Friends and Family: Not on file    Frequency of Social Gatherings with Friends and Family: Not on file    Attends Religious Services: Not on file    Active Member of Clubs or Organizations: Not on file    Attends Banker Meetings: Not on file    Marital Status: Married     Review of Systems   Constitutional:  Negative for appetite change and unexpected weight change.  HENT:  Positive for hearing loss and tinnitus. Negative for congestion, sinus pressure and sore throat.   Eyes:  Negative for pain and visual disturbance.  Respiratory:  Negative for cough, chest tightness and shortness of breath.   Cardiovascular:  Negative for chest pain and palpitations.  Gastrointestinal:  Negative for abdominal pain, diarrhea, nausea and vomiting.  Genitourinary:  Negative for difficulty urinating and dysuria.  Musculoskeletal:  Negative for joint swelling and myalgias.  Skin:  Negative for color change  and rash.  Neurological:  Negative for dizziness and headaches.  Hematological:  Negative for adenopathy. Does not bruise/bleed easily.  Psychiatric/Behavioral:  Negative for agitation and dysphoric mood.        Objective:     BP 126/72   Pulse 76   Ht 5\' 5"  (1.651 m)   Wt 209 lb 12.8 oz (95.2 kg)   SpO2 95%   BMI 34.91 kg/m  Wt Readings from Last 3 Encounters:  12/17/22 209 lb 12.8 oz (95.2 kg)  08/16/22 217 lb 3.2 oz (98.5 kg)  07/16/22 216 lb (98 kg)    Physical Exam Vitals reviewed.  Constitutional:      General: She is not in acute distress.    Appearance: Normal appearance. She is well-developed.  HENT:     Head: Normocephalic and atraumatic.     Right Ear: External ear normal.     Left Ear: External ear normal.  Eyes:     General: No scleral icterus.       Right eye: No discharge.        Left eye: No discharge.     Conjunctiva/sclera: Conjunctivae normal.  Neck:     Thyroid: No thyromegaly.  Cardiovascular:     Rate and Rhythm: Normal rate and regular rhythm.  Pulmonary:     Effort: No tachypnea, accessory muscle usage or respiratory distress.     Breath sounds: Normal breath sounds. No decreased breath sounds or wheezing.  Chest:  Breasts:    Right: No inverted nipple, mass, nipple discharge or tenderness (no axillary adenopathy).     Left: No inverted  nipple, mass, nipple discharge or tenderness (no axilarry adenopathy).  Abdominal:     General: Bowel sounds are normal.     Palpations: Abdomen is soft.     Tenderness: There is no abdominal tenderness.  Musculoskeletal:        General: No swelling or tenderness.     Cervical back: Neck supple.  Lymphadenopathy:     Cervical: No cervical adenopathy.  Skin:    Findings: No erythema or rash.  Neurological:     Mental Status: She is alert and oriented to person, place, and time.  Psychiatric:        Mood and Affect: Mood normal.        Behavior: Behavior normal.      Outpatient Encounter Medications as of 12/17/2022  Medication Sig   acetaminophen (TYLENOL) 325 MG tablet Take 2 tablets (650 mg total) by mouth every 6 (six) hours as needed for mild pain (or Fever >/= 101).   amLODipine (NORVASC) 5 MG tablet Take 1 tablet by mouth daily.   buPROPion (WELLBUTRIN XL) 300 MG 24 hr tablet Take 1 tablet (300 mg total) by mouth daily.   clotrimazole-betamethasone (LOTRISONE) cream Apply 1 application topically 2 (two) times daily.   doxycycline (VIBRA-TABS) 100 MG tablet Take 1 tablet (100 mg total) by mouth 2 (two) times daily.   fluconazole (DIFLUCAN) 150 MG tablet Take 1 tablet x 1 and if persistent symptoms may repeat x 1 in 3 days.   fluticasone-salmeterol (ADVAIR HFA) 230-21 MCG/ACT inhaler Inhale 2 puffs into the lungs 2 (two) times daily.   FREESTYLE LITE test strip    hyoscyamine (LEVSIN SL) 0.125 MG SL tablet PLACE 1 TABLET (0.125 MG TOTAL) UNDER THE TONGUE DAILY AS NEEDED.   insulin glargine (LANTUS SOLOSTAR) 100 UNIT/ML Solostar Pen Inject 26 Units into the skin at bedtime.   ipratropium (ATROVENT) 0.06 % nasal spray  Place 2 sprays into both nostrils 3 (three) times daily as needed for rhinitis.   Ipratropium-Albuterol (COMBIVENT) 20-100 MCG/ACT AERS respimat Inhale 2 puffs into the lungs every 4 (four) hours while awake. (Patient taking differently: Inhale 1 puff into the lungs 2  (two) times daily. And as needed as a rescue inhaler.)   isosorbide mononitrate (IMDUR) 30 MG 24 hr tablet Take 1 tablet (30 mg total) by mouth daily.   JARDIANCE 25 MG TABS tablet Take 25 mg by mouth daily.   LORazepam (ATIVAN) 0.5 MG tablet    losartan (COZAAR) 25 MG tablet Take 1 tablet (25 mg total) by mouth daily.   metFORMIN (GLUCOPHAGE) 500 MG tablet Take 2 tablets (1,000 mg total) by mouth 2 (two) times daily.   metoprolol tartrate (LOPRESSOR) 25 MG tablet Take 1 tablet (25 mg total) by mouth 2 (two) times daily.   nitroGLYCERIN (NITROSTAT) 0.4 MG SL tablet Place 1 tablet (0.4 mg total) under the tongue every 5 (five) minutes as needed for chest pain.   prasugrel (EFFIENT) 10 MG TABS tablet Take 1 tablet (10 mg total) by mouth daily.   RABEprazole (ACIPHEX) 20 MG tablet TAKE 1 TABLET IN THE MORNING AND AT BEDTIME   rosuvastatin (CRESTOR) 5 MG tablet Take 1 tablet (5 mg total) by mouth daily.   TRULICITY 3 MG/0.5ML SOPN Inject 3 mg into the skin once a week.   ULTRACARE PEN NEEDLES 32G X 4 MM MISC    No facility-administered encounter medications on file as of 12/17/2022.     Lab Results  Component Value Date   WBC 8.0 01/12/2022   HGB 14.8 01/12/2022   HCT 45.8 01/12/2022   PLT 161.0 01/12/2022   GLUCOSE 113 (H) 12/12/2022   CHOL 128 12/12/2022   TRIG 219.0 (H) 12/12/2022   HDL 38.30 (L) 12/12/2022   LDLDIRECT 78.0 01/23/2021   LDLCALC 45 12/12/2022   ALT 23 12/12/2022   AST 19 12/12/2022   NA 140 12/12/2022   K 4.3 12/12/2022   CL 105 12/12/2022   CREATININE 0.76 12/12/2022   BUN 12 12/12/2022   CO2 25 12/12/2022   TSH 1.44 08/16/2022   INR 0.9 03/06/2020   HGBA1C 7.0 (H) 12/12/2022   MICROALBUR 0.7 08/16/2022    Mammogram 3D SCREEN BREAST BILATERAL  Result Date: 11/02/2022 CLINICAL DATA:  Screening. EXAM: DIGITAL SCREENING BILATERAL MAMMOGRAM WITH TOMOSYNTHESIS AND CAD TECHNIQUE: Bilateral screening digital craniocaudal and mediolateral oblique mammograms were  obtained. Bilateral screening digital breast tomosynthesis was performed. The images were evaluated with computer-aided detection. COMPARISON:  Previous exam(s). ACR Breast Density Category b: There are scattered areas of fibroglandular density. FINDINGS: There are no findings suspicious for malignancy. IMPRESSION: No mammographic evidence of malignancy. A result letter of this screening mammogram will be mailed directly to the patient. RECOMMENDATION: Screening mammogram in one year. (Code:SM-B-01Y) BI-RADS CATEGORY  1: Negative. Electronically Signed   By: Sherian Rein M.D.   On: 11/02/2022 14:04   DEXAScan  Result Date: 11/01/2022 EXAM: DUAL X-RAY ABSORPTIOMETRY (DXA) FOR BONE MINERAL DENSITY IMPRESSION: Your patient Shanita Po completed a BMD test on 11/01/2022 using the [employer] iDXA DXA System (software version: 14.10) manufactured by [employer]. The following summarizes the results of our evaluation. Technologist: SCE PATIENT BIOGRAPHICAL: Name: Ryannah, Broomell Patient ID: [id number] Birth Date: 01/11/1956 Height: 64.5 in. Gender: Female Exam Date: 11/01/2022 Weight: 212.3 lbs. Indications: Asthma, Caucasian, COPD, Diabetic, Height Loss, History of Fracture (Adult), Hysterectomy, Oophorectomy Bilateral, Vitamin D Deficiency Fractures:  Ankle Left, Ankle Right, Humerus Left, Humerus Right, Wrist Left, Wrist Right Treatments: Albuterol, combivent, Insulin, Jardiance, Metformin, Ozempic Injection DENSITOMETRY RESULTS: Site      Region     Measured Date Measured Age WHO Classification Young Adult T-score BMD         %Change vs. Previous Significant Change (*) AP Spine L1-L4 11/01/[age] 67.7 Normal 1.7 1.408 g/cm2 - - DualFemur Neck Right 10/01/2021 67.7 Osteoporosis -2.7 0.668 g/cm2 - - ASSESSMENT: The BMD measured at Femur Neck Right is 0.668 g/cm2 with a T-score of -2.7. This patient is considered osteoporotic according to World Health Organization [facility]) criteria. The scan quality is good. World  Science writer [facility]) criteria for post-menopausal, Caucasian Women: Normal:                   T-score at or above -1 SD Osteopenia/low bone mass: T-score between -1 and -2.5 SD Osteoporosis:             T-score at or below -2.5 SD RECOMMENDATIONS: 1. All patients should optimize calcium and vitamin D intake. 2. Consider FDA-approved medical therapies in postmenopausal women and men aged 84 years and older, based on the following: a. A hip or vertebral(clinical or morphometric) fracture b. T-score < -2.5 at the femoral neck or spine after appropriate evaluation to exclude secondary causes c. Low bone mass (T-score between -1.0 and -2.5 at the femoral neck or spine) and a 10-year probability of a hip fracture > 3% or a 10-year probability of a major osteoporosis-related fracture > 20% based on the [place]-adapted WHO algorithm 3. Clinician judgment and/or patient preferences may indicate treatment for people with 10-year fracture probabilities above or below these levels FOLLOW-UP: People with diagnosed cases of osteoporosis or at high risk for fracture should have regular bone mineral density tests. For patients eligible for Medicare, routine testing is allowed once every 2 years. The testing frequency can be increased to one year for patients who have rapidly progressing disease, those who are receiving or discontinuing medical therapy to restore bone mass, or have additional risk factors. I have reviewed this report, and agree with the above findings. [facility] Electronically Signed   By: Baird Lyons M.D.   On: 11/01/2022 14:08       Assessment & Plan:  Decreased hearing of left ear Assessment & Plan: Tinnitus and decreased hearing. Refer to ENT for evaluation.   Orders: -     Ambulatory referral to ENT  Health care maintenance Assessment & Plan: Physical today 12/17/22.  PAP 12/2016 - negative with negative HPV.  Colonoscopy 02/2020.  Mammogram 11/01/22 - birads I.    Abnormal liver  function tests Assessment & Plan: Diet and exercise.  Follow liver function tests.    Mild intermittent asthma without complication Assessment & Plan: Continue advair and singulair.  Follow.     Crohn's disease with complication, unspecified gastrointestinal tract location [facility]) Assessment & Plan: Bowels stable.     Coronary artery disease involving native coronary artery of native heart with angina pectoris [facility]) Assessment & Plan: S/p NSTEMI.  S/p PCI-LAD.  Continue effiant, imdur, aspirin, crestor and metoprolol.  Stable.     Type 2 diabetes mellitus with other circulatory complication, with long-term current use of insulin (HCC) Assessment & Plan: Sugar as outlined. Continue metformin, ozempic, lantus and jardiance. Follow met b and A1c. Low carb diet and exercise.    Essential hypertension Assessment & Plan: Continue on amlodipine and metoprolol.  Follow pressures.  Follow metabolic panel.    Gastroesophageal reflux disease without esophagitis Assessment & Plan: Continue aciphex. No upper symptoms reported.    Hepatic steatosis Assessment & Plan: Changes c/w fatty liver on ultrasound.  Diet, exercise and weight loss.  Follow liver function tests.     Hypercholesterolemia without hypertriglyceridemia Assessment & Plan: Continue crestor.  Low cholesterol diet and exercise.  Follow lipid panel and liver function tests.     Mild depression (HCC) Assessment & Plan: Continue wellbutrin.  Doing better. Does not feel needs any further intervention at this time.  Follow.     Obesity, diabetes, and hypertension syndrome (HCC) Assessment & Plan: Diet, exercise and weight loss.    Polycythemia vera (HCC) Assessment & Plan: Has seen hematology.  Follow cbc.       Dale Assumption, MD

## 2022-12-17 ENCOUNTER — Ambulatory Visit (INDEPENDENT_AMBULATORY_CARE_PROVIDER_SITE_OTHER): Payer: Medicare Other | Admitting: Internal Medicine

## 2022-12-17 ENCOUNTER — Encounter: Payer: Self-pay | Admitting: Internal Medicine

## 2022-12-17 VITALS — BP 126/72 | HR 76 | Ht 65.0 in | Wt 209.8 lb

## 2022-12-17 DIAGNOSIS — I25119 Atherosclerotic heart disease of native coronary artery with unspecified angina pectoris: Secondary | ICD-10-CM | POA: Diagnosis not present

## 2022-12-17 DIAGNOSIS — Z0000 Encounter for general adult medical examination without abnormal findings: Secondary | ICD-10-CM | POA: Diagnosis not present

## 2022-12-17 DIAGNOSIS — R7989 Other specified abnormal findings of blood chemistry: Secondary | ICD-10-CM | POA: Diagnosis not present

## 2022-12-17 DIAGNOSIS — D45 Polycythemia vera: Secondary | ICD-10-CM

## 2022-12-17 DIAGNOSIS — Z794 Long term (current) use of insulin: Secondary | ICD-10-CM

## 2022-12-17 DIAGNOSIS — J4520 Mild intermittent asthma, uncomplicated: Secondary | ICD-10-CM | POA: Diagnosis not present

## 2022-12-17 DIAGNOSIS — H9192 Unspecified hearing loss, left ear: Secondary | ICD-10-CM

## 2022-12-17 DIAGNOSIS — E7800 Pure hypercholesterolemia, unspecified: Secondary | ICD-10-CM

## 2022-12-17 DIAGNOSIS — K50919 Crohn's disease, unspecified, with unspecified complications: Secondary | ICD-10-CM

## 2022-12-17 DIAGNOSIS — F32A Depression, unspecified: Secondary | ICD-10-CM

## 2022-12-17 DIAGNOSIS — K760 Fatty (change of) liver, not elsewhere classified: Secondary | ICD-10-CM

## 2022-12-17 DIAGNOSIS — I152 Hypertension secondary to endocrine disorders: Secondary | ICD-10-CM

## 2022-12-17 DIAGNOSIS — E1159 Type 2 diabetes mellitus with other circulatory complications: Secondary | ICD-10-CM

## 2022-12-17 DIAGNOSIS — I10 Essential (primary) hypertension: Secondary | ICD-10-CM

## 2022-12-17 DIAGNOSIS — K219 Gastro-esophageal reflux disease without esophagitis: Secondary | ICD-10-CM

## 2022-12-22 ENCOUNTER — Encounter: Payer: Self-pay | Admitting: Internal Medicine

## 2022-12-22 DIAGNOSIS — H9190 Unspecified hearing loss, unspecified ear: Secondary | ICD-10-CM | POA: Insufficient documentation

## 2022-12-22 NOTE — Assessment & Plan Note (Signed)
Has seen hematology.  Follow cbc.

## 2022-12-22 NOTE — Assessment & Plan Note (Signed)
S/p NSTEMI.  S/p PCI-LAD.  Continue effiant, imdur, aspirin, crestor and metoprolol.  Stable.

## 2022-12-22 NOTE — Assessment & Plan Note (Signed)
Diet and exercise.  Follow liver function tests.

## 2022-12-22 NOTE — Assessment & Plan Note (Signed)
Continue on amlodipine and metoprolol.  Follow pressures.  Follow metabolic panel.

## 2022-12-22 NOTE — Assessment & Plan Note (Signed)
Continue crestor.  Low cholesterol diet and exercise.  Follow lipid panel and liver function tests.  

## 2022-12-22 NOTE — Assessment & Plan Note (Signed)
Bowels stable.

## 2022-12-22 NOTE — Assessment & Plan Note (Signed)
Changes c/w fatty liver on ultrasound.  Diet, exercise and weight loss.  Follow liver function tests.

## 2022-12-22 NOTE — Assessment & Plan Note (Addendum)
Sugar as outlined. Continue metformin, ozempic, lantus and jardiance. Follow met b and A1c. Low carb diet and exercise.

## 2022-12-22 NOTE — Assessment & Plan Note (Signed)
Continue aciphex. No upper symptoms reported.

## 2022-12-22 NOTE — Assessment & Plan Note (Signed)
Tinnitus and decreased hearing. Refer to ENT for evaluation.

## 2022-12-22 NOTE — Assessment & Plan Note (Signed)
Continue advair and singulair.  Follow.

## 2022-12-22 NOTE — Assessment & Plan Note (Signed)
Diet, exercise and weight loss.

## 2022-12-22 NOTE — Assessment & Plan Note (Signed)
Continue wellbutrin.  Doing better. Does not feel needs any further intervention at this time.  Follow.

## 2023-01-04 ENCOUNTER — Encounter: Payer: Self-pay | Admitting: Internal Medicine

## 2023-01-04 DIAGNOSIS — H9319 Tinnitus, unspecified ear: Secondary | ICD-10-CM | POA: Insufficient documentation

## 2023-04-03 ENCOUNTER — Other Ambulatory Visit: Payer: Self-pay | Admitting: Internal Medicine

## 2023-04-11 ENCOUNTER — Telehealth: Payer: Self-pay | Admitting: Internal Medicine

## 2023-04-11 NOTE — Telephone Encounter (Signed)
 Patient need orders

## 2023-04-12 ENCOUNTER — Other Ambulatory Visit: Payer: Self-pay | Admitting: *Deleted

## 2023-04-12 DIAGNOSIS — I10 Essential (primary) hypertension: Secondary | ICD-10-CM

## 2023-04-12 DIAGNOSIS — Z794 Long term (current) use of insulin: Secondary | ICD-10-CM

## 2023-04-12 DIAGNOSIS — E7800 Pure hypercholesterolemia, unspecified: Secondary | ICD-10-CM

## 2023-04-12 NOTE — Telephone Encounter (Signed)
 Orders placed.

## 2023-04-16 ENCOUNTER — Other Ambulatory Visit (INDEPENDENT_AMBULATORY_CARE_PROVIDER_SITE_OTHER): Payer: Medicare Other

## 2023-04-16 DIAGNOSIS — I10 Essential (primary) hypertension: Secondary | ICD-10-CM

## 2023-04-16 DIAGNOSIS — Z794 Long term (current) use of insulin: Secondary | ICD-10-CM

## 2023-04-16 DIAGNOSIS — E7800 Pure hypercholesterolemia, unspecified: Secondary | ICD-10-CM

## 2023-04-16 DIAGNOSIS — E1159 Type 2 diabetes mellitus with other circulatory complications: Secondary | ICD-10-CM | POA: Diagnosis not present

## 2023-04-16 LAB — HEMOGLOBIN A1C: Hgb A1c MFr Bld: 7.1 % — ABNORMAL HIGH (ref 4.6–6.5)

## 2023-04-16 LAB — COMPREHENSIVE METABOLIC PANEL
ALT: 14 U/L (ref 0–35)
AST: 12 U/L (ref 0–37)
Albumin: 3.8 g/dL (ref 3.5–5.2)
Alkaline Phosphatase: 59 U/L (ref 39–117)
BUN: 14 mg/dL (ref 6–23)
CO2: 25 meq/L (ref 19–32)
Calcium: 8.9 mg/dL (ref 8.4–10.5)
Chloride: 104 meq/L (ref 96–112)
Creatinine, Ser: 0.78 mg/dL (ref 0.40–1.20)
GFR: 78.16 mL/min (ref >60.00)
Glucose, Bld: 93 mg/dL (ref 70–99)
Potassium: 4.3 meq/L (ref 3.5–5.1)
Sodium: 139 meq/L (ref 135–145)
Total Bilirubin: 0.6 mg/dL (ref 0.2–1.2)
Total Protein: 6.8 g/dL (ref 6.0–8.3)

## 2023-04-16 LAB — LIPID PANEL
Cholesterol: 144 mg/dL (ref 0–200)
HDL: 47.5 mg/dL (ref >39.00)
LDL Cholesterol: 59 mg/dL (ref 0–99)
NonHDL: 96
Total CHOL/HDL Ratio: 3
Triglycerides: 183 mg/dL — ABNORMAL HIGH (ref 0.0–149.0)
VLDL: 36.6 mg/dL (ref 0.0–40.0)

## 2023-04-16 LAB — HEPATIC FUNCTION PANEL
ALT: 14 U/L (ref 0–35)
AST: 12 U/L (ref 0–37)
Albumin: 3.8 g/dL (ref 3.5–5.2)
Alkaline Phosphatase: 59 U/L (ref 39–117)
Bilirubin, Direct: 0.1 mg/dL (ref 0.0–0.3)
Total Bilirubin: 0.6 mg/dL (ref 0.2–1.2)
Total Protein: 6.8 g/dL (ref 6.0–8.3)

## 2023-04-18 ENCOUNTER — Ambulatory Visit (INDEPENDENT_AMBULATORY_CARE_PROVIDER_SITE_OTHER): Payer: Medicare Other | Admitting: Internal Medicine

## 2023-04-18 ENCOUNTER — Encounter: Payer: Self-pay | Admitting: Internal Medicine

## 2023-04-18 VITALS — BP 120/70 | HR 74 | Temp 98.1°F | Ht 65.0 in | Wt 204.6 lb

## 2023-04-18 DIAGNOSIS — K760 Fatty (change of) liver, not elsewhere classified: Secondary | ICD-10-CM

## 2023-04-18 DIAGNOSIS — I25119 Atherosclerotic heart disease of native coronary artery with unspecified angina pectoris: Secondary | ICD-10-CM

## 2023-04-18 DIAGNOSIS — F32A Depression, unspecified: Secondary | ICD-10-CM

## 2023-04-18 DIAGNOSIS — E1169 Type 2 diabetes mellitus with other specified complication: Secondary | ICD-10-CM | POA: Diagnosis not present

## 2023-04-18 DIAGNOSIS — Z794 Long term (current) use of insulin: Secondary | ICD-10-CM

## 2023-04-18 DIAGNOSIS — J4520 Mild intermittent asthma, uncomplicated: Secondary | ICD-10-CM

## 2023-04-18 DIAGNOSIS — I10 Essential (primary) hypertension: Secondary | ICD-10-CM

## 2023-04-18 DIAGNOSIS — M545 Low back pain, unspecified: Secondary | ICD-10-CM

## 2023-04-18 DIAGNOSIS — D45 Polycythemia vera: Secondary | ICD-10-CM

## 2023-04-18 DIAGNOSIS — K5090 Crohn's disease, unspecified, without complications: Secondary | ICD-10-CM

## 2023-04-18 DIAGNOSIS — K219 Gastro-esophageal reflux disease without esophagitis: Secondary | ICD-10-CM

## 2023-04-18 DIAGNOSIS — E119 Type 2 diabetes mellitus without complications: Secondary | ICD-10-CM

## 2023-04-18 DIAGNOSIS — E7800 Pure hypercholesterolemia, unspecified: Secondary | ICD-10-CM | POA: Diagnosis not present

## 2023-04-18 DIAGNOSIS — E1159 Type 2 diabetes mellitus with other circulatory complications: Secondary | ICD-10-CM

## 2023-04-18 DIAGNOSIS — I152 Hypertension secondary to endocrine disorders: Secondary | ICD-10-CM

## 2023-04-18 MED ORDER — ROSUVASTATIN CALCIUM 5 MG PO TABS: 5.0000 mg | ORAL_TABLET | Freq: Every day | ORAL | 3 refills | Status: AC

## 2023-04-18 MED ORDER — METAXALONE 400 MG PO TABS: 400.0000 mg | ORAL_TABLET | Freq: Every evening | ORAL | 0 refills | Status: DC | PRN

## 2023-04-18 NOTE — Progress Notes (Signed)
 Subjective:    [name], female    DOB: [birth year], 68 y.o.   MRN: [id number]  Patient here for  Chief Complaint  Patient presents with   Medical Management of Chronic Issues    HPI Here for a scheduled follow up - f/u regarding hypercholesterolemia, hypertension and diabetes. Seeing Dr Damian.  Last seen 04/15/23 - A1c 6.8 per her note. Continues on ozempic, lantus , metformin  and jardiance . Evaluated by Dr Parris 04/08/23 - acutely ill with wheezing and severe cough. Husband - sick and hospitalized. Given solumedrol injection and placed on medrol  dose pak. Given hydrocodone  cough syrup and treated with zpak. Reports that her breathing is doing ok. No increased cough or congestion currently. She does report some left low back pain - started around Christmas. Lying down aggravates. Taking tylenol . No radicular symptoms. No known injury or trauma.    Past Medical History:  Diagnosis Date   Arthritis    Asthma    Complication of anesthesia    HARD TO WAKE UP   COPD (chronic obstructive pulmonary disease) (HCC)    Crohn's disease (HCC)    Depression    Diabetes mellitus (HCC)    Fracture, foot    post fall   Hypercholesterolemia    Hypertension    IBS (irritable bowel syndrome)    Nephrolithiasis    s/p lithotripsy [facility])   Vitamin D  deficiency    Past Surgical History:  Procedure Laterality Date   ABDOMINAL HYSTERECTOMY  1985   APPENDECTOMY  1985   BREAST DUCTAL SYSTEM EXCISION Left 09/04/2019   Procedure: EXCISION DUCTAL SYSTEM BREAST;  Surgeon: Dessa Reyes ORN, MD;  Location: [facility] ORS;  Service: General;  Laterality: Left;   BREAST EXCISIONAL BIOPSY Right 2013   benign   COLONOSCOPY  2014   COLONOSCOPY WITH PROPOFOL  N/A 02/10/2020   Procedure: COLONOSCOPY WITH PROPOFOL ;  Surgeon: Dessa Reyes ORN, MD;  Location: [facility] ENDOS[facility];  Service: Endoscopy;  Laterality: N/A;   CORONARY STENT INTERVENTION N/A 03/07/2020   Procedure: CORONARY  STENT INTERVENTION;  Surgeon: Ammon Blunt, MD;  Location: [facility];  Service: Cardiovascular;  Laterality: N/A;   EXCISION / BIOPSY BREAST / NIPPLE / DUCT Left 2019   papilloma   EXCISION OF BREAST BIOPSY Left 2019   papilloma   LAPAROSCOPIC CHOLECYSTECTOMY  2005   Dr Claudene   LAPAROSCOPIC SUPRACERVICAL HYSTERECTOMY  1985   left ovary not removed   LEFT HEART CATH AND CORONARY ANGIOGRAPHY N/A 03/07/2020   Procedure: LEFT HEART CATH AND CORONARY ANGIOGRAPHY;  Surgeon: Ammon Blunt, MD;  Location: [facility];  Service: Cardiovascular;  Laterality: N/A;   LEFT HEART CATH AND CORONARY ANGIOGRAPHY N/A 03/10/2020   Procedure: LEFT HEART CATH AND CORONARY ANGIOGRAPHY and possible PCI and stent;  Surgeon: Florencio Cara BIRCH, MD;  Location: [facility];  Service: Cardiovascular;  Laterality: N/A;   LITHOTRIPSY     x7   OOPHORECTOMY     POLYPECTOMY     SHOULDER SURGERY     right (pinning)   SHOULDER SURGERY     left (pinning)   TONSILLECTOMY  1966   Family History  Problem Relation Age of Onset   Heart disease Father    Emphysema Maternal Grandmother    COPD Mother    Hypercholesterolemia Mother    Emphysema Mother    Hypertension Brother    Emphysema Other        grandmother   COPD Sister  Crohn's disease Sister    Hypertension Sister    Breast cancer Neg Hx    Colon cancer Neg Hx    Social History   Socioeconomic History   Marital status: Married    Spouse name: Not on file   Number of children: 2   Years of education: Not on file   Highest education level: Not on file  Occupational History    Employer: [employer]  Tobacco Use   Smoking status: Former    Current packs/day: 0.00    Average packs/day: 2.0 packs/day for 15.0 years (30.0 ttl pk-yrs)    Types: Cigarettes    Start date: 04/09/1982    Quit date: 04/09/1997    Years since quitting: 26.0   Smokeless tobacco: Never  Vaping Use   Vaping status: Never Used   Substance and Sexual Activity   Alcohol use: Never    Alcohol/week: 0.0 standard drinks of alcohol   Drug use: No   Sexual activity: Not on file  Other Topics Concern   Not on file  Social History Narrative   Not on file   Social Drivers of Health   Financial Resource Strain: Low Risk  (07/22/2022)   Received from [facility], [facility]   Overall Financial Resource Strain (CARDIA)    Difficulty of Paying Living Expenses: Not hard at all  Food Insecurity: No Food Insecurity (07/22/2022)   Received from [facility], [facility]   Hunger Vital Sign    Worried About Running Out of Food in the Last Year: Never true    Ran Out of Food in the Last Year: Never true  Transportation Needs: No Transportation Needs (07/22/2022)   Received from [facility], [facility]   PRAPARE - Transportation    In the past 12 months, has lack of transportation kept you from medical appointments or from getting medications?: No    Lack of Transportation (Non-Medical): No  Physical Activity: Unknown (07/16/2022)   Exercise Vital Sign    Days of Exercise per Week: 0 days    Minutes of Exercise per Session: Not on file  Stress: No Stress Concern Present (07/16/2022)   [employer] of Occupational Health - Occupational Stress Questionnaire    Feeling of Stress : Not at all  Social Connections: Unknown (07/16/2022)   Social Connection and Isolation Panel [NHANES]    Frequency of Communication with Friends and Family: Not on file    Frequency of Social Gatherings with Friends and Family: Not on file    Attends Religious Services: Not on file    Active Member of Clubs or Organizations: Not on file    Attends Banker Meetings: Not on file    Marital Status: Married     Review of Systems  Constitutional:  Negative for appetite change and unexpected weight change.  HENT:  Negative for  congestion and sinus pressure.   Respiratory:  Negative for cough and chest tightness.        Breathing stable.   Cardiovascular:  Negative for chest pain and palpitations.  Gastrointestinal:  Negative for abdominal pain, diarrhea, nausea and vomiting.  Genitourinary:  Negative for difficulty urinating and dysuria.  Musculoskeletal:  Positive for back pain. Negative for joint swelling and myalgias.  Skin:  Negative for color change and rash.  Neurological:  Negative for dizziness and headaches.  Psychiatric/Behavioral:  Negative for agitation and dysphoric mood.  Objective:     BP 120/70   Pulse 74   Temp 98.1 F (36.7 C) (Oral)   Ht 5' 5 (1.651 m)   Wt 204 lb 9.6 oz (92.8 kg)   SpO2 96%   BMI 34.05 kg/m  Wt Readings from Last 3 Encounters:  04/18/23 204 lb 9.6 oz (92.8 kg)  12/17/22 209 lb 12.8 oz (95.2 kg)  08/16/22 217 lb 3.2 oz (98.5 kg)    Physical Exam Vitals reviewed.  Constitutional:      General: She is not in acute distress.    Appearance: Normal appearance.  HENT:     Head: Normocephalic and atraumatic.     Right Ear: External ear normal.     Left Ear: External ear normal.     Mouth/Throat:     Pharynx: No oropharyngeal exudate or posterior oropharyngeal erythema.  Eyes:     General: No scleral icterus.       Right eye: No discharge.        Left eye: No discharge.     Conjunctiva/sclera: Conjunctivae normal.  Neck:     Thyroid : No thyromegaly.  Cardiovascular:     Rate and Rhythm: Normal rate and regular rhythm.  Pulmonary:     Effort: No respiratory distress.     Breath sounds: Normal breath sounds. No wheezing.  Abdominal:     General: Bowel sounds are normal.     Palpations: Abdomen is soft.     Tenderness: There is no abdominal tenderness.  Musculoskeletal:        General: No swelling or tenderness.     Cervical back: Neck supple. No tenderness.     Comments: Pain in lower back - when back is in a strained position. Negative SLR.    Lymphadenopathy:     Cervical: No cervical adenopathy.  Skin:    Findings: No erythema or rash.  Neurological:     Mental Status: She is alert.  Psychiatric:        Mood and Affect: Mood normal.        Behavior: Behavior normal.      Outpatient Encounter Medications as of 04/18/2023  Medication Sig   metaxalone  (SKELAXIN ) 400 MG tablet Take 1 tablet (400 mg total) by mouth at bedtime as needed.   Semaglutide, 2 MG/DOSE, 8 MG/3ML SOPN Inject into the skin.   acetaminophen  (TYLENOL ) 325 MG tablet Take 2 tablets (650 mg total) by mouth every 6 (six) hours as needed for mild pain (or Fever >/= 101).   amLODipine  (NORVASC ) 5 MG tablet Take 1 tablet by mouth daily.   buPROPion  (WELLBUTRIN  XL) 300 MG 24 hr tablet TAKE 1 TABLET DAILY (KEEP FOLLOW UP APPOINTMENT)   fluconazole  (DIFLUCAN ) 150 MG tablet Take 1 tablet x 1 and if persistent symptoms may repeat x 1 in 3 days.   fluticasone -salmeterol (ADVAIR HFA) 230-21 MCG/ACT inhaler Inhale 2 puffs into the lungs 2 (two) times daily.   FREESTYLE LITE test strip    hyoscyamine  (LEVSIN  SL) 0.125 MG SL tablet PLACE 1 TABLET (0.125 MG TOTAL) UNDER THE TONGUE DAILY AS NEEDED.   insulin  glargine (LANTUS  SOLOSTAR) 100 UNIT/ML Solostar Pen Inject 26 Units into the skin at bedtime.   ipratropium (ATROVENT ) 0.06 % nasal spray Place 2 sprays into both nostrils 3 (three) times daily as needed for rhinitis.   Ipratropium-Albuterol  (COMBIVENT ) 20-100 MCG/ACT AERS respimat Inhale 2 puffs into the lungs every 4 (four) hours while awake. (Patient taking differently: Inhale 1 puff into the lungs  2 (two) times daily. And as needed as a rescue inhaler.)   isosorbide  mononitrate (IMDUR ) 30 MG 24 hr tablet Take 1 tablet (30 mg total) by mouth daily.   JARDIANCE  25 MG TABS tablet Take 25 mg by mouth daily.   losartan  (COZAAR ) 25 MG tablet Take 1 tablet (25 mg total) by mouth daily.   metFORMIN  (GLUCOPHAGE ) 500 MG tablet TAKE 2 TABLETS TWICE A DAY   metoprolol  tartrate  (LOPRESSOR ) 25 MG tablet TAKE 1 TABLET TWICE A DAY (KEEP FOLLOW UP APPOINTMENT)   nitroGLYCERIN  (NITROSTAT ) 0.4 MG SL tablet Place 1 tablet (0.4 mg total) under the tongue every 5 (five) minutes as needed for chest pain.   RABEprazole  (ACIPHEX ) 20 MG tablet TAKE 1 TABLET IN THE MORNING AND AT BEDTIME   rosuvastatin  (CRESTOR ) 5 MG tablet Take 1 tablet (5 mg total) by mouth daily.   ULTRACARE PEN NEEDLES 32G X 4 MM MISC    [DISCONTINUED] clotrimazole -betamethasone  (LOTRISONE ) cream Apply 1 application topically 2 (two) times daily. (Patient not taking: Reported on 04/18/2023)   [DISCONTINUED] doxycycline  (VIBRA -TABS) 100 MG tablet Take 1 tablet (100 mg total) by mouth 2 (two) times daily. (Patient not taking: Reported on 04/18/2023)   [DISCONTINUED] LORazepam  (ATIVAN ) 0.5 MG tablet  (Patient not taking: Reported on 04/18/2023)   [DISCONTINUED] prasugrel  (EFFIENT ) 10 MG TABS tablet Take 1 tablet (10 mg total) by mouth daily. (Patient not taking: Reported on 04/18/2023)   [DISCONTINUED] rosuvastatin  (CRESTOR ) 5 MG tablet Take 1 tablet (5 mg total) by mouth daily.   [DISCONTINUED] TRULICITY  3 MG/0.5ML SOPN Inject 3 mg into the skin once a week. (Patient not taking: Reported on 04/18/2023)   No facility-administered encounter medications on file as of 04/18/2023.     Lab Results  Component Value Date   WBC 8.0 01/12/2022   HGB 14.8 01/12/2022   HCT 45.8 01/12/2022   PLT 161.0 01/12/2022   GLUCOSE 93 04/16/2023   CHOL 144 04/16/2023   TRIG 183.0 (H) 04/16/2023   HDL 47.50 04/16/2023   LDLDIRECT 78.0 01/23/2021   LDLCALC 59 04/16/2023   ALT 14 04/16/2023   ALT 14 04/16/2023   AST 12 04/16/2023   AST 12 04/16/2023   NA 139 04/16/2023   K 4.3 04/16/2023   CL 104 04/16/2023   CREATININE 0.78 04/16/2023   BUN 14 04/16/2023   CO2 25 04/16/2023   TSH 1.44 08/16/2022   INR 0.9 03/06/2020   HGBA1C 7.1 (H) 04/16/2023   MICROALBUR 0.7 08/16/2022    Mammogram 3D SCREEN BREAST BILATERAL Result Date:  11/02/2022 CLINICAL DATA:  Screening. EXAM: DIGITAL SCREENING BILATERAL MAMMOGRAM WITH TOMOSYNTHESIS AND CAD TECHNIQUE: Bilateral screening digital craniocaudal and mediolateral oblique mammograms were obtained. Bilateral screening digital breast tomosynthesis was performed. The images were evaluated with computer-aided detection. COMPARISON:  Previous exam(s). ACR Breast Density Category b: There are scattered areas of fibroglandular density. FINDINGS: There are no findings suspicious for malignancy. IMPRESSION: No mammographic evidence of malignancy. A result letter of this screening mammogram will be mailed directly to the patient. RECOMMENDATION: Screening mammogram in one year. (Code:SM-B-01Y) BI-RADS CATEGORY  1: Negative. Electronically Signed   By: Craig Farr M.D.   On: 11/02/2022 14:04   DEXAScan Result Date: 11/01/2022 EXAM: DUAL X-RAY ABSORPTIOMETRY (DXA) FOR BONE MINERAL DENSITY IMPRESSION: Your patient Devany Aja completed a BMD test on 11/01/2022 using the [employer] iDXA DXA System (software version: 14.10) manufactured by [employer]. The following summarizes the results of our evaluation. Technologist: SCE PATIENT BIOGRAPHICAL:  Name: Grettell, Ransdell Patient ID: [id number] Birth Date: 1955/08/19 Height: 64.5 in. Gender: Female Exam Date: 11/01/2022 Weight: 212.3 lbs. Indications: Asthma, Caucasian, COPD, Diabetic, Height Loss, History of Fracture (Adult), Hysterectomy, Oophorectomy Bilateral, Vitamin D  Deficiency Fractures: Ankle Left, Ankle Right, Humerus Left, Humerus Right, Wrist Left, Wrist Right Treatments: Albuterol , combivent , Insulin , Jardiance , Metformin , Ozempic Injection DENSITOMETRY RESULTS: Site      Region     Measured Date Measured Age WHO Classification Young Adult T-score BMD         %Change vs. Previous Significant Change (*) AP Spine L1-L4 11/01/2021 67.7 Normal 1.7 1.408 g/cm2 - - DualFemur Neck Right 11[age]01/2021 67.7 Osteoporosis -2.7 0.668 g/cm2 - - ASSESSMENT: The  BMD measured at Femur Neck Right is 0.668 g/cm2 with a T-score of -2.7. This patient is considered osteoporotic according to World Health Organization [facility]) criteria. The scan quality is good. [employer] [facility]) criteria for post-menopausal, Caucasian Women: Normal:                   T-score at or above -1 SD Osteopenia/low bone mass: T-score between -1 and -2.5 SD Osteoporosis:             T-score at or below -2.5 SD RECOMMENDATIONS: 1. All patients should optimize calcium  and vitamin D  intake. 2. Consider FDA-approved medical therapies in postmenopausal women and men aged 38 years and older, based on the following: a. A hip or vertebral(clinical or morphometric) fracture b. T-score < -2.5 at the femoral neck or spine after appropriate evaluation to exclude secondary causes c. Low bone mass (T-score between -1.0 and -2.5 at the femoral neck or spine) and a 10-year probability of a hip fracture > 3% or a 10-year probability of a major osteoporosis-related fracture > 20% based on the [place] -adapted WHO algorithm 3. Clinician judgment and/or patient preferences may indicate treatment for people with 10-year fracture probabilities above or below these levels FOLLOW-UP: People with diagnosed cases of osteoporosis or at high risk for fracture should have regular bone mineral density tests. For patients eligible for Medicare, routine testing is allowed once every 2 years. The testing frequency can be increased to one year for patients who have rapidly progressing disease, those who are receiving or discontinuing medical therapy to restore bone mass, or have additional risk factors. I have reviewed this report, and agree with the above findings. [facility] Electronically Signed   By: Dina  Arceo M.D.   On: 11/01/2022 14:08       Assessment & Plan:  Polycythemia vera (HCC) Assessment & Plan: Has seen hematology.  Follow cbc.    Obesity, diabetes, and hypertension syndrome (HCC) Assessment  & Plan: Diet, exercise and weight loss. Seeing Dr Damian.  A1c checked there - 6.8.    Mild depression (HCC) Assessment & Plan: Continue wellbutrin . Does not feel needs any further intervention at this time.  Follow.     Primary hypertension Assessment & Plan: Continue losartan , metoprolol , imdur  and amlodipine .  Blood pressure doing well.  Follow pressures.  Follow metabolic panel.    Hypercholesterolemia without hypertriglyceridemia Assessment & Plan: Continue crestor .  Low cholesterol diet and exercise.  Follow lipid panel and liver function tests.     Hepatic steatosis Assessment & Plan: Changes c/w fatty liver on ultrasound.  Diet, exercise and weight loss.  Follow liver function tests.     Gastroesophageal reflux disease without esophagitis Assessment & Plan: Continue aciphex . No upper symptoms reported.    Type 2 diabetes mellitus  with other circulatory complication, with long-term current use of insulin  [facility]) Assessment & Plan: Sugar as outlined. Continue metformin , ozempic, lantus  and jardiance . Follow met b and A1c. Low carb diet and exercise. A1c checked through Dr Damian - 6.8.    Crohn's disease without complication, unspecified gastrointestinal tract location [facility]) Assessment & Plan: Bowels stable.    Coronary artery disease involving native coronary artery of native heart with angina pectoris [facility]) Assessment & Plan: S/p NSTEMI.  S/p PCI-LAD.  Continue effiant, imdur , aspirin , crestor  and metoprolol .  Stable.     Mild intermittent asthma without complication Assessment & Plan: Continue advair and singulair .  Follow.  Breathing stable.    Midline low back pain without sciatica, unspecified chronicity Assessment & Plan: Low back pain as outlined.  No known injury or trauma. No radicular symptoms. Continue tylenol . Affecting her sleep.  Skelaxin . Has taken previously. Follow.  Call with update.    Other orders -     Rosuvastatin  Calcium ; Take 1 tablet  (5 mg total) by mouth daily.  Dispense: 90 tablet; Refill: 3 -     Metaxalone ; Take 1 tablet (400 mg total) by mouth at bedtime as needed.  Dispense: 20 tablet; Refill: 0     Allena Hamilton, MD

## 2023-04-21 ENCOUNTER — Encounter: Payer: Self-pay | Admitting: Internal Medicine

## 2023-04-21 NOTE — Assessment & Plan Note (Signed)
Continue losartan, metoprolol, imdur and amlodipine.  Blood pressure doing well.  Follow pressures.  Follow metabolic panel.  

## 2023-04-21 NOTE — Assessment & Plan Note (Signed)
S/p NSTEMI.  S/p PCI-LAD.  Continue effiant, imdur, aspirin, crestor and metoprolol.  Stable.

## 2023-04-21 NOTE — Assessment & Plan Note (Signed)
 Continue crestor.  Low cholesterol diet and exercise.  Follow lipid panel and liver function tests.

## 2023-04-21 NOTE — Assessment & Plan Note (Signed)
Has seen hematology.  Follow cbc.

## 2023-04-21 NOTE — Assessment & Plan Note (Signed)
Continue aciphex. No upper symptoms reported.

## 2023-04-21 NOTE — Assessment & Plan Note (Addendum)
 Continue advair and singulair.  Follow.  Breathing stable.

## 2023-04-21 NOTE — Assessment & Plan Note (Signed)
 Sugar as outlined. Continue metformin, ozempic, lantus and jardiance. Follow met b and A1c. Low carb diet and exercise. A1c checked through Dr Tedd Sias - 6.8.

## 2023-04-21 NOTE — Assessment & Plan Note (Signed)
Bowels stable.

## 2023-04-21 NOTE — Assessment & Plan Note (Signed)
Changes c/w fatty liver on ultrasound.  Diet, exercise and weight loss.  Follow liver function tests.

## 2023-04-21 NOTE — Assessment & Plan Note (Signed)
 Diet, exercise and weight loss. Seeing Dr Tedd Sias.  A1c checked there - 6.8.

## 2023-04-21 NOTE — Assessment & Plan Note (Signed)
Continue wellbutrin.  Does not feel needs any further intervention at this time.  Follow.

## 2023-04-21 NOTE — Assessment & Plan Note (Signed)
 Low back pain as outlined.  No known injury or trauma. No radicular symptoms. Continue tylenol. Affecting her sleep.  Skelaxin. Has taken previously. Follow.  Call with update.

## 2023-07-17 ENCOUNTER — Other Ambulatory Visit: Payer: Self-pay | Admitting: Internal Medicine

## 2023-08-13 ENCOUNTER — Ambulatory Visit (INDEPENDENT_AMBULATORY_CARE_PROVIDER_SITE_OTHER): Admitting: *Deleted

## 2023-08-13 VITALS — Ht 65.0 in | Wt 195.0 lb

## 2023-08-13 DIAGNOSIS — Z0000 Encounter for general adult medical examination without abnormal findings: Secondary | ICD-10-CM | POA: Diagnosis not present

## 2023-08-13 NOTE — Patient Instructions (Signed)
 Amanda Wiley , Thank you for taking time to come for your Medicare Wellness Visit. I appreciate your ongoing commitment to your health goals. Please review the following plan we discussed and let me know if I can assist you in the future.   Referrals/Orders/Follow-Ups/Clinician Recommendations: Remember to update your covid and shingles vaccines.  This is a list of the screening recommended for you and due dates:  Health Maintenance  Topic Date Due   Hepatitis C Screening  Never done   Zoster (Shingles) Vaccine (1 of 2) Never done   Eye exam for diabetics  11/18/2022   COVID-19 Vaccine (5 - 2024-25 season) 12/09/2022   Yearly kidney health urinalysis for diabetes  08/16/2023   Complete foot exam   08/16/2023   Hemoglobin A1C  10/14/2023   Mammogram  11/01/2023   Flu Shot  11/08/2023   Yearly kidney function blood test for diabetes  04/15/2024   Medicare Annual Wellness Visit  08/12/2024   Colon Cancer Screening  02/09/2030   DTaP/Tdap/Td vaccine (2 - Td or Tdap) 11/17/2030   Pneumonia Vaccine  Completed   DEXA scan (bone density measurement)  Completed   HPV Vaccine  Aged Out   Meningitis B Vaccine  Aged Out    Advanced directives: (Declined) Advance directive discussed with you today. Even though you declined this today, please call our office should you change your mind, and we can give you the proper paperwork for you to fill out.  Next Medicare Annual Wellness Visit scheduled for next year: Yes 08/18/24 @ 3:00  Have you seen your provider in the last 6 months (3 months if uncontrolled diabetes)? Yes 04/18/23

## 2023-08-13 NOTE — Progress Notes (Signed)
 Subjective:   Amanda Wiley is a 68 y.o. who presents for a Medicare Wellness preventive visit.  Visit Complete: Virtual I connected with  Jerald Molly on 08/13/23 by a audio enabled telemedicine application and verified that I am speaking with the correct person using two identifiers.  Patient Location: Home  Provider Location: Office/Clinic  I discussed the limitations of evaluation and management by telemedicine. The patient expressed understanding and agreed to proceed.  Vital Signs: Because this visit was a virtual/telehealth visit, some criteria may be missing or patient reported. Any vitals not documented were not able to be obtained and vitals that have been documented are patient reported.  VideoDeclined- This patient declined Librarian, academic. Therefore the visit was completed with audio only.  Persons Participating in Visit: Patient.  AWV Questionnaire: Yes: Patient Medicare AWV questionnaire was completed by the patient on 08/12/23; I have confirmed that all information answered by patient is correct and no changes since this date.  Cardiac Risk Factors include: advanced age (>88men, >67 women);diabetes mellitus;dyslipidemia;hypertension;obesity (BMI >30kg/m2)     Objective:    Today's Vitals   08/13/23 1546  Weight: 195 lb (88.5 kg)  Height: 5\' 5"  (1.651 m)   Body mass index is 32.45 kg/m.     08/13/2023    3:58 PM 07/16/2022   12:31 PM 07/11/2021    2:29 PM 11/22/2020    2:07 PM 11/16/2020    5:40 PM 03/21/2020    1:39 PM 03/10/2020    2:31 PM  Advanced Directives  Does Patient Have a Medical Advance Directive? No No No No No No   Would patient like information on creating a medical advance directive? No - Patient declined No - Patient declined No - Patient declined   No - Patient declined No - Patient declined    Current Medications (verified) Outpatient Encounter Medications as of 08/13/2023  Medication Sig   acetaminophen   (TYLENOL ) 325 MG tablet Take 2 tablets (650 mg total) by mouth every 6 (six) hours as needed for mild pain (or Fever >/= 101).   amLODipine  (NORVASC ) 5 MG tablet Take 1 tablet by mouth daily.   buPROPion  (WELLBUTRIN  XL) 300 MG 24 hr tablet TAKE 1 TABLET DAILY (KEEP FOLLOW UP APPOINTMENT)   fluconazole  (DIFLUCAN ) 150 MG tablet Take 1 tablet x 1 and if persistent symptoms may repeat x 1 in 3 days.   fluticasone -salmeterol (ADVAIR HFA) 230-21 MCG/ACT inhaler Inhale 2 puffs into the lungs 2 (two) times daily.   FREESTYLE LITE test strip    hyoscyamine  (LEVSIN SL) 0.125 MG SL tablet PLACE 1 TABLET (0.125 MG TOTAL) UNDER THE TONGUE DAILY AS NEEDED.   insulin  glargine (LANTUS  SOLOSTAR) 100 UNIT/ML Solostar Pen Inject 26 Units into the skin at bedtime.   ipratropium (ATROVENT ) 0.06 % nasal spray Place 2 sprays into both nostrils 3 (three) times daily as needed for rhinitis.   Ipratropium-Albuterol  (COMBIVENT ) 20-100 MCG/ACT AERS respimat Inhale 2 puffs into the lungs every 4 (four) hours while awake. (Patient taking differently: Inhale 1 puff into the lungs 2 (two) times daily. And as needed as a rescue inhaler.)   isosorbide  mononitrate (IMDUR ) 30 MG 24 hr tablet Take 1 tablet (30 mg total) by mouth daily.   JARDIANCE  25 MG TABS tablet Take 25 mg by mouth daily.   losartan  (COZAAR ) 25 MG tablet Take 1 tablet (25 mg total) by mouth daily.   metFORMIN  (GLUCOPHAGE ) 500 MG tablet TAKE 2 TABLETS TWICE A DAY  metoprolol  tartrate (LOPRESSOR ) 25 MG tablet TAKE 1 TABLET TWICE A DAY (KEEP FOLLOW UP APPOINTMENT)   nitroGLYCERIN  (NITROSTAT ) 0.4 MG SL tablet Place 1 tablet (0.4 mg total) under the tongue every 5 (five) minutes as needed for chest pain.   RABEprazole  (ACIPHEX ) 20 MG tablet TAKE 1 TABLET IN THE MORNING AND AT BEDTIME   rosuvastatin  (CRESTOR ) 5 MG tablet Take 1 tablet (5 mg total) by mouth daily.   Semaglutide, 2 MG/DOSE, 8 MG/3ML SOPN Inject into the skin.   ULTRACARE PEN NEEDLES 32G X 4 MM MISC     [DISCONTINUED] metaxalone  (SKELAXIN ) 400 MG tablet Take 1 tablet (400 mg total) by mouth at bedtime as needed.   No facility-administered encounter medications on file as of 08/13/2023.    Allergies (verified) Avelox [moxifloxacin hcl in nacl], Moxifloxacin, 5-alpha reductase inhibitors, Allegra [fexofenadine hcl], Ibuprofen, Sulfa antibiotics, Tegretol [carbamazepine], Iodine, Ioxaglate, and Ivp dye [iodinated contrast media]   History: Past Medical History:  Diagnosis Date   Arthritis    Asthma    Complication of anesthesia    HARD TO WAKE UP   COPD (chronic obstructive pulmonary disease) (HCC)    Crohn's disease (HCC)    Depression    Diabetes mellitus (HCC)    Fracture, foot    post fall   Hypercholesterolemia    Hypertension    IBS (irritable bowel syndrome)    Nephrolithiasis    s/p lithotripsy [facility])   Vitamin D  deficiency    Past Surgical History:  Procedure Laterality Date   ABDOMINAL HYSTERECTOMY  1985   APPENDECTOMY  1985   BREAST DUCTAL SYSTEM EXCISION Left 09/04/2019   Procedure: EXCISION DUCTAL SYSTEM BREAST;  Surgeon: Marshall Skeeter, MD;  Location: [facility] ORS;  Service: General;  Laterality: Left;   BREAST EXCISIONAL BIOPSY Right 2013   benign   COLONOSCOPY  2014   COLONOSCOPY WITH PROPOFOL  N/A 02/10/2020   Procedure: COLONOSCOPY WITH PROPOFOL ;  Surgeon: Marshall Skeeter, MD;  Location: [facility] ENDOSCOPY;  Service: Endoscopy;  Laterality: N/A;   CORONARY STENT INTERVENTION N/A 03/07/2020   Procedure: CORONARY STENT INTERVENTION;  Surgeon: Percival Brace, MD;  Location: [facility] INVASIVE CV LAB;  Service: Cardiovascular;  Laterality: N/A;   EXCISION / BIOPSY BREAST / NIPPLE / DUCT Left 2019   papilloma   EXCISION OF BREAST BIOPSY Left 2019   papilloma   LAPAROSCOPIC CHOLECYSTECTOMY  2005   Dr Felipe Horton   LAPAROSCOPIC SUPRACERVICAL HYSTERECTOMY  1985   left ovary not removed   LEFT HEART CATH AND CORONARY ANGIOGRAPHY N/A 03/07/2020    Procedure: LEFT HEART CATH AND CORONARY ANGIOGRAPHY;  Surgeon: Percival Brace, MD;  Location: [facility] CV [facility];  Service: Cardiovascular;  Laterality: N/A;   LEFT HEART CATH AND CORONARY ANGIOGRAPHY N/A 03/10/2020   Procedure: LEFT HEART CATH AND CORONARY ANGIOGRAPHY and possible PCI and stent;  Surgeon: Antonette Batters, MD;  Location: [facility] CV [facility];  Service: Cardiovascular;  Laterality: N/A;   LITHOTRIPSY     x7   MOUTH SURGERY  2024   OOPHORECTOMY     POLYPECTOMY     SHOULDER SURGERY     right (pinning)   SHOULDER SURGERY     left (pinning)   TONSILLECTOMY  1966   Family History  Problem Relation Age of Onset   Heart disease Father    Emphysema Maternal Grandmother    COPD Mother    Hypercholesterolemia Mother    Emphysema Mother    Hypertension Brother    Emphysema  Other        grandmother   COPD Sister    Crohn's disease Sister    Hypertension Sister    Breast cancer Neg Hx    Colon cancer Neg Hx    Social History   Socioeconomic History   Marital status: Married    Spouse name: Not on file   Number of children: 2   Years of education: Not on file   Highest education level: Some college, no degree  Occupational History    Employer: [employer]  Tobacco Use   Smoking status: Former    Current packs/day: 0.00    Average packs/day: 2.0 packs/day for 15.0 years (30.0 ttl pk-yrs)    Types: Cigarettes    Start date: 04/09/1982    Quit date: 04/09/1997    Years since quitting: 26.3   Smokeless tobacco: Never  Vaping Use   Vaping status: Never Used  Substance and Sexual Activity   Alcohol use: Never    Alcohol/week: 0.0 standard drinks of alcohol   Drug use: No   Sexual activity: Not on file  Other Topics Concern   Not on file  Social History Narrative   Married   Social Drivers of Health   Financial Resource Strain: Low Risk  (08/12/2023)   Overall Financial Resource Strain (CARDIA)    Difficulty of Paying Living Expenses: Not hard at  all  Food Insecurity: No Food Insecurity (08/12/2023)   Hunger Vital Sign    Worried About Running Out of Food in the Last Year: Never true    Ran Out of Food in the Last Year: Never true  Transportation Needs: No Transportation Needs (08/12/2023)   PRAPARE - Administrator, Civil Service (Medical): No    Lack of Transportation (Non-Medical): No  Physical Activity: Inactive (08/12/2023)   Exercise Vital Sign    Days of Exercise per Week: 0 days    Minutes of Exercise per Session: 0 min  Stress: No Stress Concern Present (08/12/2023)   [employer] of Occupational Health - Occupational Stress Questionnaire    Feeling of Stress : Only a little  Social Connections: Moderately Isolated (08/12/2023)   Social Connection and Isolation Panel [NHANES]    Frequency of Communication with Friends and Family: More than three times a week    Frequency of Social Gatherings with Friends and Family: Three times a week    Attends Religious Services: Never    Active Member of Clubs or Organizations: No    Attends Engineer, structural: Not on file    Marital Status: Married    Tobacco Counseling Counseling given: Not Answered    Clinical Intake:  Pre-visit preparation completed: Yes  Pain : No/denies pain     BMI - recorded: 32.45 Nutritional Status: BMI > 30  Obese Nutritional Risks: None Diabetes: Yes CBG done?: No Did pt. bring in CBG monitor from home?: No  Lab Results  Component Value Date   HGBA1C 7.1 (H) 04/16/2023   HGBA1C 7.0 (H) 12/12/2022   HGBA1C 7.7 (H) 08/16/2022     How often do you need to have someone help you when you read instructions, pamphlets, or other written materials from your doctor or pharmacy?: 1 - Never  Interpreter Needed?: No  Information entered by :: R. Daevon Holdren LPN   Activities of Daily Living     08/13/2023    3:47 PM  In your present state of health, do you have any difficulty performing the following activities:  Hearing? 0   Vision? 0  Comment glasses  Difficulty concentrating or making decisions? 0  Walking or climbing stairs? 0  Dressing or bathing? 0  Doing errands, shopping? 0  Preparing Food and eating ? N  Using the Toilet? N  In the past six months, have you accidently leaked urine? N  Do you have problems with loss of bowel control? N  Managing your Medications? N  Managing your Finances? N  Housekeeping or managing your Housekeeping? N    Patient Care Team: Dellar Fenton, MD as PCP - General (Internal Medicine)  Indicate any recent Medical Services you may have received from other than [facility] providers in the past year (date may be approximate).     Assessment:   This is a routine wellness examination for Amanda Wiley.  Hearing/Vision screen Hearing Screening - Comments:: No issues Vision Screening - Comments:: glasses   Goals Addressed             This Visit's Progress    Patient Stated       Wants to try to exercise more        Depression Screen     08/13/2023    3:51 PM 12/17/2022    3:34 PM 07/16/2022   12:28 PM 12/08/2021    9:32 AM 07/11/2021    2:27 PM 03/13/2021    2:07 PM 11/29/2020    9:13 AM  PHQ 2/9 Scores  PHQ - 2 Score 1 0 0 0 0 0 0  PHQ- 9 Score 2 0     0    Fall Risk     08/13/2023    3:48 PM 12/17/2022    3:33 PM 07/16/2022   12:31 PM 12/08/2021    9:32 AM 07/11/2021    2:31 PM  Fall Risk   Falls in the past year? 0 0 0 0 0  Number falls in past yr: 0 0 0 0   Injury with Fall? 0 0 0 0   Risk for fall due to : No Fall Risks No Fall Risks  No Fall Risks   Follow up Falls prevention discussed;Falls evaluation completed Falls evaluation completed Falls evaluation completed;Falls prevention discussed Falls evaluation completed Falls evaluation completed    MEDICARE RISK AT HOME:  Medicare Risk at Home Any stairs in or around the home?: Yes If so, are there any without handrails?: No Home free of loose throw rugs in walkways, pet beds, electrical cords, etc?:  Yes Adequate lighting in your home to reduce risk of falls?: Yes Life alert?: No Use of a cane, walker or w/c?: No Grab bars in the bathroom?: Yes Shower chair or bench in shower?: Yes Elevated toilet seat or a handicapped toilet?: No  TIMED UP AND GO:  Was the test performed?  No  Cognitive Function: 6CIT completed        08/13/2023    3:59 PM 07/16/2022   12:32 PM  6CIT Screen  What Year? 0 points 0 points  What month? 0 points 0 points  What time? 0 points 0 points  Count back from 20 0 points 0 points  Months in reverse 2 points 0 points  Repeat phrase 0 points 0 points  Total Score 2 points 0 points    Immunizations Immunization History  Administered Date(s) Administered   Fluad Quad(high Dose 65+) 02/12/2020   Influenza Split 12/30/2013, 01/23/2016   Influenza,inj,Quad PF,6+ Mos 02/11/2018, 02/10/2019   Influenza-Unspecified 01/24/2015, 11/29/2020   PFIZER Comirnaty(Gray Top)Covid-19 Tri-Sucrose Vaccine 06/29/2019  PFIZER(Purple Top)SARS-COV-2 Vaccination 06/29/2019, 07/27/2019, 04/05/2020   PNEUMOCOCCAL CONJUGATE-20 08/12/2020   Tdap 11/16/2020    Screening Tests Health Maintenance  Topic Date Due   Hepatitis C Screening  Never done   Zoster Vaccines- Shingrix (1 of 2) Never done   OPHTHALMOLOGY EXAM  11/18/2022   COVID-19 Vaccine (5 - 2024-25 season) 12/09/2022   Medicare Annual Wellness (AWV)  07/16/2023   Diabetic kidney evaluation - Urine ACR  08/16/2023   FOOT EXAM  08/16/2023   HEMOGLOBIN A1C  10/14/2023   MAMMOGRAM  11/01/2023   INFLUENZA VACCINE  11/08/2023   Diabetic kidney evaluation - eGFR measurement  04/15/2024   Colonoscopy  02/09/2030   DTaP/Tdap/Td (2 - Td or Tdap) 11/17/2030   Pneumonia Vaccine 55+ Years old  Completed   DEXA SCAN  Completed   HPV VACCINES  Aged Out   Meningococcal B Vaccine  Aged Out    Health Maintenance  Health Maintenance Due  Topic Date Due   Hepatitis C Screening  Never done   Zoster Vaccines- Shingrix (1  of 2) Never done   OPHTHALMOLOGY EXAM  11/18/2022   COVID-19 Vaccine (5 - 2024-25 season) 12/09/2022   Medicare Annual Wellness (AWV)  07/16/2023   Diabetic kidney evaluation - Urine ACR  08/16/2023   Health Maintenance Items Addressed: Discussed the need  to up date her shingles and covid vaccines.  Patient stated that she sees Dr. Lorelei Rogers for her diabetes. Patient stated that she will discuss with PCP at next visit to decide if Hepatitis C screening is necessary.   Additional Screening:  Vision Screening: Recommended annual ophthalmology exams for early detection of glaucoma and other disorders of the eye. Up to date [facility], will request office notes  Dental Screening: Recommended annual dental exams for proper oral hygiene  Community Resource Referral / Chronic Care Management: CRR required this visit?  No   CCM required this visit?  No     Plan:     I have personally reviewed and noted the following in the patient's chart:   Medical and social history Use of alcohol, tobacco or illicit drugs  Current medications and supplements including opioid prescriptions. Patient is not currently taking opioid prescriptions. Functional ability and status Nutritional status Physical activity Advanced directives List of other physicians Hospitalizations, surgeries, and ER visits in previous 12 months Vitals Screenings to include cognitive, depression, and falls Referrals and appointments  In addition, I have reviewed and discussed with patient certain preventive protocols, quality metrics, and best practice recommendations. A written personalized care plan for preventive services as well as general preventive health recommendations were provided to patient.     Felicitas Horse, LPN   04/14/1094   After Visit Summary: (MyChart) Due to this being a telephonic visit, the after visit summary with patients personalized plan was offered to patient via MyChart   Notes: Nothing significant  to report at this time.

## 2023-08-16 ENCOUNTER — Telehealth: Payer: Self-pay

## 2023-08-16 NOTE — Telephone Encounter (Signed)
 Please schedule a f/u appt with me. Last visit 04/2023. Requested 4 month f/u. Was not scheduled. Please schedule. No urgency. Thanks.

## 2023-08-16 NOTE — Telephone Encounter (Signed)
 Diabetic eye exam was requested from [facility] center. They sent a fax back stating that pt has not been seen since 2023.

## 2023-10-09 ENCOUNTER — Other Ambulatory Visit: Payer: Self-pay

## 2023-10-09 DIAGNOSIS — I152 Hypertension secondary to endocrine disorders: Secondary | ICD-10-CM

## 2023-12-05 ENCOUNTER — Other Ambulatory Visit: Payer: Self-pay | Admitting: Internal Medicine

## 2023-12-05 DIAGNOSIS — Z1231 Encounter for screening mammogram for malignant neoplasm of breast: Secondary | ICD-10-CM

## 2023-12-26 ENCOUNTER — Encounter

## 2024-03-23 ENCOUNTER — Encounter: Payer: Self-pay | Admitting: *Deleted

## 2024-04-20 ENCOUNTER — Other Ambulatory Visit: Payer: Self-pay | Admitting: Internal Medicine

## 2024-08-18 ENCOUNTER — Ambulatory Visit
